# Patient Record
Sex: Female | Born: 1955
Health system: Southern US, Community
[De-identification: ages and names within clinical notes are randomized; demographics above are authoritative.]

## PROBLEM LIST (undated history)

## (undated) DIAGNOSIS — R112 Nausea with vomiting, unspecified: Secondary | ICD-10-CM

## (undated) DIAGNOSIS — K219 Gastro-esophageal reflux disease without esophagitis: Secondary | ICD-10-CM

## (undated) DIAGNOSIS — T7840XA Allergy, unspecified, initial encounter: Secondary | ICD-10-CM

## (undated) DIAGNOSIS — M797 Fibromyalgia: Secondary | ICD-10-CM

## (undated) DIAGNOSIS — M2550 Pain in unspecified joint: Secondary | ICD-10-CM

## (undated) DIAGNOSIS — F32A Depression, unspecified: Secondary | ICD-10-CM

## (undated) DIAGNOSIS — Z9989 Dependence on other enabling machines and devices: Secondary | ICD-10-CM

## (undated) DIAGNOSIS — J45909 Unspecified asthma, uncomplicated: Secondary | ICD-10-CM

## (undated) DIAGNOSIS — R609 Edema, unspecified: Secondary | ICD-10-CM

## (undated) DIAGNOSIS — R7303 Prediabetes: Secondary | ICD-10-CM

## (undated) DIAGNOSIS — Z5189 Encounter for other specified aftercare: Secondary | ICD-10-CM

## (undated) DIAGNOSIS — Z803 Family history of malignant neoplasm of breast: Secondary | ICD-10-CM

## (undated) DIAGNOSIS — M1990 Unspecified osteoarthritis, unspecified site: Secondary | ICD-10-CM

## (undated) DIAGNOSIS — K635 Polyp of colon: Secondary | ICD-10-CM

## (undated) DIAGNOSIS — D649 Anemia, unspecified: Secondary | ICD-10-CM

## (undated) DIAGNOSIS — E785 Hyperlipidemia, unspecified: Secondary | ICD-10-CM

## (undated) DIAGNOSIS — G4730 Sleep apnea, unspecified: Secondary | ICD-10-CM

## (undated) DIAGNOSIS — A879 Viral meningitis, unspecified: Secondary | ICD-10-CM

## (undated) DIAGNOSIS — E669 Obesity, unspecified: Secondary | ICD-10-CM

## (undated) DIAGNOSIS — G709 Myoneural disorder, unspecified: Secondary | ICD-10-CM

## (undated) DIAGNOSIS — C50919 Malignant neoplasm of unspecified site of unspecified female breast: Secondary | ICD-10-CM

## (undated) DIAGNOSIS — Z9889 Other specified postprocedural states: Secondary | ICD-10-CM

## (undated) DIAGNOSIS — F419 Anxiety disorder, unspecified: Secondary | ICD-10-CM

## (undated) DIAGNOSIS — I10 Essential (primary) hypertension: Secondary | ICD-10-CM

## (undated) DIAGNOSIS — F329 Major depressive disorder, single episode, unspecified: Secondary | ICD-10-CM

## (undated) DIAGNOSIS — J40 Bronchitis, not specified as acute or chronic: Secondary | ICD-10-CM

## (undated) DIAGNOSIS — R739 Hyperglycemia, unspecified: Secondary | ICD-10-CM

## (undated) HISTORY — DX: Dependence on other enabling machines and devices: Z99.89

## (undated) HISTORY — DX: Myoneural disorder, unspecified: G70.9

## (undated) HISTORY — DX: Depression, unspecified: F32.A

## (undated) HISTORY — PX: POLYPECTOMY: SHX149

## (undated) HISTORY — DX: Family history of malignant neoplasm of breast: Z80.3

## (undated) HISTORY — DX: Essential (primary) hypertension: I10

## (undated) HISTORY — PX: TONSILLECTOMY: SUR1361

## (undated) HISTORY — DX: Anxiety disorder, unspecified: F41.9

## (undated) HISTORY — DX: Pain in unspecified joint: M25.50

## (undated) HISTORY — PX: HAND SURGERY: SHX662

## (undated) HISTORY — DX: Fibromyalgia: M79.7

## (undated) HISTORY — DX: Gastro-esophageal reflux disease without esophagitis: K21.9

## (undated) HISTORY — DX: Viral meningitis, unspecified: A87.9

## (undated) HISTORY — DX: Anemia, unspecified: D64.9

## (undated) HISTORY — DX: Unspecified osteoarthritis, unspecified site: M19.90

## (undated) HISTORY — PX: KNEE ARTHROSCOPY: SUR90

## (undated) HISTORY — DX: Polyp of colon: K63.5

## (undated) HISTORY — DX: Malignant neoplasm of unspecified site of unspecified female breast: C50.919

## (undated) HISTORY — DX: Bronchitis, not specified as acute or chronic: J40

## (undated) HISTORY — DX: Allergy, unspecified, initial encounter: T78.40XA

## (undated) HISTORY — PX: BREAST SURGERY: SHX581

## (undated) HISTORY — DX: Unspecified asthma, uncomplicated: J45.909

## (undated) HISTORY — PX: BACK SURGERY: SHX140

## (undated) HISTORY — PX: OTHER SURGICAL HISTORY: SHX169

## (undated) HISTORY — PX: PARATHYROIDECTOMY: SHX19

## (undated) HISTORY — DX: Major depressive disorder, single episode, unspecified: F32.9

## (undated) HISTORY — DX: Encounter for other specified aftercare: Z51.89

## (undated) HISTORY — DX: Hyperlipidemia, unspecified: E78.5

## (undated) HISTORY — DX: Prediabetes: R73.03

## (undated) HISTORY — DX: Edema, unspecified: R60.9

## (undated) HISTORY — PX: ADENOIDECTOMY: SUR15

## (undated) HISTORY — PX: TOE SURGERY: SHX1073

## (undated) HISTORY — DX: Hyperglycemia, unspecified: R73.9

---

## 1978-05-29 HISTORY — PX: SEPTOPLASTY: SUR1290

## 1980-05-29 HISTORY — PX: TUMOR REMOVAL: SHX12

## 1991-05-30 HISTORY — PX: BREAST EXCISIONAL BIOPSY: SUR124

## 1994-01-13 HISTORY — PX: MASTECTOMY: SHX3

## 1996-05-29 HISTORY — PX: ABDOMINAL HYSTERECTOMY: SHX81

## 1997-08-24 ENCOUNTER — Ambulatory Visit (HOSPITAL_COMMUNITY): Admission: RE | Admit: 1997-08-24 | Discharge: 1997-08-24 | Payer: Self-pay | Admitting: Obstetrics and Gynecology

## 1998-08-19 ENCOUNTER — Inpatient Hospital Stay (HOSPITAL_COMMUNITY): Admission: RE | Admit: 1998-08-19 | Discharge: 1998-08-24 | Payer: Self-pay | Admitting: Obstetrics and Gynecology

## 1999-06-30 ENCOUNTER — Encounter: Admission: RE | Admit: 1999-06-30 | Discharge: 1999-06-30 | Payer: Self-pay | Admitting: Oncology

## 1999-06-30 ENCOUNTER — Encounter: Payer: Self-pay | Admitting: Oncology

## 1999-08-12 ENCOUNTER — Encounter (INDEPENDENT_AMBULATORY_CARE_PROVIDER_SITE_OTHER): Payer: Self-pay | Admitting: Specialist

## 1999-08-12 ENCOUNTER — Ambulatory Visit (HOSPITAL_BASED_OUTPATIENT_CLINIC_OR_DEPARTMENT_OTHER): Admission: RE | Admit: 1999-08-12 | Discharge: 1999-08-12 | Payer: Self-pay | Admitting: *Deleted

## 2001-02-20 ENCOUNTER — Encounter: Payer: Self-pay | Admitting: Emergency Medicine

## 2001-02-20 ENCOUNTER — Emergency Department (HOSPITAL_COMMUNITY): Admission: EM | Admit: 2001-02-20 | Discharge: 2001-02-20 | Payer: Self-pay | Admitting: Emergency Medicine

## 2001-02-23 ENCOUNTER — Ambulatory Visit (HOSPITAL_COMMUNITY): Admission: RE | Admit: 2001-02-23 | Discharge: 2001-02-23 | Payer: Self-pay | Admitting: Orthopedic Surgery

## 2001-02-24 ENCOUNTER — Encounter: Payer: Self-pay | Admitting: Orthopedic Surgery

## 2001-04-26 ENCOUNTER — Ambulatory Visit (HOSPITAL_COMMUNITY): Admission: RE | Admit: 2001-04-26 | Discharge: 2001-04-26 | Payer: Self-pay | Admitting: Internal Medicine

## 2001-04-26 ENCOUNTER — Encounter: Payer: Self-pay | Admitting: Internal Medicine

## 2001-04-30 ENCOUNTER — Other Ambulatory Visit: Admission: RE | Admit: 2001-04-30 | Discharge: 2001-04-30 | Payer: Self-pay | Admitting: Obstetrics and Gynecology

## 2001-05-09 ENCOUNTER — Encounter: Payer: Self-pay | Admitting: Obstetrics and Gynecology

## 2001-05-09 ENCOUNTER — Encounter: Admission: RE | Admit: 2001-05-09 | Discharge: 2001-05-09 | Payer: Self-pay | Admitting: Obstetrics and Gynecology

## 2001-06-14 ENCOUNTER — Encounter: Payer: Self-pay | Admitting: *Deleted

## 2001-06-18 ENCOUNTER — Encounter (INDEPENDENT_AMBULATORY_CARE_PROVIDER_SITE_OTHER): Payer: Self-pay | Admitting: *Deleted

## 2001-06-18 ENCOUNTER — Ambulatory Visit (HOSPITAL_COMMUNITY): Admission: RE | Admit: 2001-06-18 | Discharge: 2001-06-19 | Payer: Self-pay | Admitting: *Deleted

## 2001-06-27 ENCOUNTER — Emergency Department (HOSPITAL_COMMUNITY): Admission: EM | Admit: 2001-06-27 | Discharge: 2001-06-27 | Payer: Self-pay

## 2001-09-30 ENCOUNTER — Encounter: Admission: RE | Admit: 2001-09-30 | Discharge: 2001-09-30 | Payer: Self-pay | Admitting: Family Medicine

## 2001-09-30 ENCOUNTER — Encounter: Payer: Self-pay | Admitting: Family Medicine

## 2001-10-03 ENCOUNTER — Encounter: Admission: RE | Admit: 2001-10-03 | Discharge: 2001-10-03 | Payer: Self-pay | Admitting: Family Medicine

## 2001-10-03 ENCOUNTER — Encounter: Payer: Self-pay | Admitting: Family Medicine

## 2002-06-20 ENCOUNTER — Encounter: Payer: Self-pay | Admitting: Internal Medicine

## 2002-06-20 ENCOUNTER — Inpatient Hospital Stay (HOSPITAL_COMMUNITY): Admission: EM | Admit: 2002-06-20 | Discharge: 2002-06-25 | Payer: Self-pay | Admitting: Internal Medicine

## 2002-07-24 ENCOUNTER — Other Ambulatory Visit: Admission: RE | Admit: 2002-07-24 | Discharge: 2002-07-24 | Payer: Self-pay | Admitting: Obstetrics and Gynecology

## 2002-09-28 ENCOUNTER — Encounter: Payer: Self-pay | Admitting: Emergency Medicine

## 2002-09-28 ENCOUNTER — Emergency Department (HOSPITAL_COMMUNITY): Admission: EM | Admit: 2002-09-28 | Discharge: 2002-09-28 | Payer: Self-pay | Admitting: Emergency Medicine

## 2003-07-30 ENCOUNTER — Other Ambulatory Visit: Admission: RE | Admit: 2003-07-30 | Discharge: 2003-07-30 | Payer: Self-pay | Admitting: Obstetrics and Gynecology

## 2004-03-19 ENCOUNTER — Ambulatory Visit: Payer: Self-pay | Admitting: Oncology

## 2004-06-27 ENCOUNTER — Encounter: Admission: RE | Admit: 2004-06-27 | Discharge: 2004-06-27 | Payer: Self-pay | Admitting: Otolaryngology

## 2004-08-09 ENCOUNTER — Other Ambulatory Visit: Admission: RE | Admit: 2004-08-09 | Discharge: 2004-08-09 | Payer: Self-pay | Admitting: Obstetrics and Gynecology

## 2005-12-25 ENCOUNTER — Ambulatory Visit: Payer: Self-pay | Admitting: Family Medicine

## 2006-01-22 ENCOUNTER — Ambulatory Visit: Payer: Self-pay | Admitting: Family Medicine

## 2006-02-07 ENCOUNTER — Ambulatory Visit: Payer: Self-pay | Admitting: Family Medicine

## 2006-02-08 ENCOUNTER — Ambulatory Visit: Payer: Self-pay | Admitting: Family Medicine

## 2006-02-20 ENCOUNTER — Ambulatory Visit (HOSPITAL_BASED_OUTPATIENT_CLINIC_OR_DEPARTMENT_OTHER): Admission: RE | Admit: 2006-02-20 | Discharge: 2006-02-21 | Payer: Self-pay | Admitting: Orthopaedic Surgery

## 2006-03-20 ENCOUNTER — Ambulatory Visit: Payer: Self-pay | Admitting: Internal Medicine

## 2006-04-03 ENCOUNTER — Encounter (INDEPENDENT_AMBULATORY_CARE_PROVIDER_SITE_OTHER): Payer: Self-pay | Admitting: *Deleted

## 2006-04-03 ENCOUNTER — Ambulatory Visit: Payer: Self-pay | Admitting: Internal Medicine

## 2006-05-07 ENCOUNTER — Ambulatory Visit: Payer: Self-pay | Admitting: Family Medicine

## 2006-06-25 DIAGNOSIS — I10 Essential (primary) hypertension: Secondary | ICD-10-CM | POA: Insufficient documentation

## 2006-06-25 DIAGNOSIS — F418 Other specified anxiety disorders: Secondary | ICD-10-CM

## 2006-06-25 DIAGNOSIS — E1159 Type 2 diabetes mellitus with other circulatory complications: Secondary | ICD-10-CM | POA: Insufficient documentation

## 2006-06-25 DIAGNOSIS — K219 Gastro-esophageal reflux disease without esophagitis: Secondary | ICD-10-CM

## 2006-06-25 HISTORY — DX: Other specified anxiety disorders: F41.8

## 2006-06-25 HISTORY — DX: Gastro-esophageal reflux disease without esophagitis: K21.9

## 2006-06-25 HISTORY — DX: Essential (primary) hypertension: I10

## 2006-07-12 ENCOUNTER — Other Ambulatory Visit: Admission: RE | Admit: 2006-07-12 | Discharge: 2006-07-12 | Payer: Self-pay | Admitting: Obstetrics and Gynecology

## 2006-08-03 ENCOUNTER — Ambulatory Visit: Payer: Self-pay | Admitting: Vascular Surgery

## 2006-08-03 ENCOUNTER — Emergency Department (HOSPITAL_COMMUNITY): Admission: EM | Admit: 2006-08-03 | Discharge: 2006-08-03 | Payer: Self-pay | Admitting: Emergency Medicine

## 2006-08-07 ENCOUNTER — Ambulatory Visit (HOSPITAL_BASED_OUTPATIENT_CLINIC_OR_DEPARTMENT_OTHER): Admission: RE | Admit: 2006-08-07 | Discharge: 2006-08-07 | Payer: Self-pay | Admitting: Orthopaedic Surgery

## 2006-10-01 ENCOUNTER — Ambulatory Visit: Payer: Self-pay | Admitting: Internal Medicine

## 2006-10-18 ENCOUNTER — Ambulatory Visit: Payer: Self-pay | Admitting: Internal Medicine

## 2006-11-01 ENCOUNTER — Telehealth (INDEPENDENT_AMBULATORY_CARE_PROVIDER_SITE_OTHER): Payer: Self-pay | Admitting: *Deleted

## 2006-11-01 ENCOUNTER — Ambulatory Visit: Payer: Self-pay | Admitting: Family Medicine

## 2006-11-01 DIAGNOSIS — R609 Edema, unspecified: Secondary | ICD-10-CM | POA: Insufficient documentation

## 2007-02-28 ENCOUNTER — Ambulatory Visit: Payer: Self-pay | Admitting: Family Medicine

## 2007-02-28 DIAGNOSIS — M1990 Unspecified osteoarthritis, unspecified site: Secondary | ICD-10-CM | POA: Insufficient documentation

## 2007-02-28 DIAGNOSIS — M79609 Pain in unspecified limb: Secondary | ICD-10-CM | POA: Insufficient documentation

## 2007-02-28 LAB — CONVERTED CEMR LAB
BUN: 10 mg/dL (ref 6–23)
CO2: 32 meq/L (ref 19–32)
Calcium: 9.8 mg/dL (ref 8.4–10.5)
Chloride: 102 meq/L (ref 96–112)
Creatinine, Ser: 0.8 mg/dL (ref 0.4–1.2)
GFR calc Af Amer: 97 mL/min
GFR calc non Af Amer: 80 mL/min
Glucose, Bld: 112 mg/dL — ABNORMAL HIGH (ref 70–99)
Potassium: 4.3 meq/L (ref 3.5–5.1)
Sodium: 141 meq/L (ref 135–145)
Uric Acid, Serum: 6.6 mg/dL (ref 2.4–7.0)

## 2007-03-01 ENCOUNTER — Telehealth (INDEPENDENT_AMBULATORY_CARE_PROVIDER_SITE_OTHER): Payer: Self-pay | Admitting: *Deleted

## 2007-03-20 ENCOUNTER — Encounter (INDEPENDENT_AMBULATORY_CARE_PROVIDER_SITE_OTHER): Payer: Self-pay | Admitting: Family Medicine

## 2007-04-10 ENCOUNTER — Encounter (INDEPENDENT_AMBULATORY_CARE_PROVIDER_SITE_OTHER): Payer: Self-pay | Admitting: Family Medicine

## 2007-05-21 ENCOUNTER — Encounter (INDEPENDENT_AMBULATORY_CARE_PROVIDER_SITE_OTHER): Payer: Self-pay | Admitting: Family Medicine

## 2007-05-22 ENCOUNTER — Encounter (INDEPENDENT_AMBULATORY_CARE_PROVIDER_SITE_OTHER): Payer: Self-pay | Admitting: Family Medicine

## 2007-06-13 ENCOUNTER — Ambulatory Visit: Payer: Self-pay | Admitting: Internal Medicine

## 2007-06-13 DIAGNOSIS — J069 Acute upper respiratory infection, unspecified: Secondary | ICD-10-CM | POA: Insufficient documentation

## 2007-07-04 ENCOUNTER — Telehealth (INDEPENDENT_AMBULATORY_CARE_PROVIDER_SITE_OTHER): Payer: Self-pay | Admitting: *Deleted

## 2007-07-05 ENCOUNTER — Ambulatory Visit: Payer: Self-pay | Admitting: Family Medicine

## 2007-07-05 DIAGNOSIS — J309 Allergic rhinitis, unspecified: Secondary | ICD-10-CM | POA: Insufficient documentation

## 2007-07-05 HISTORY — DX: Allergic rhinitis, unspecified: J30.9

## 2007-07-08 ENCOUNTER — Telehealth (INDEPENDENT_AMBULATORY_CARE_PROVIDER_SITE_OTHER): Payer: Self-pay | Admitting: *Deleted

## 2007-07-17 ENCOUNTER — Other Ambulatory Visit: Admission: RE | Admit: 2007-07-17 | Discharge: 2007-07-17 | Payer: Self-pay | Admitting: Obstetrics and Gynecology

## 2007-07-31 ENCOUNTER — Telehealth (INDEPENDENT_AMBULATORY_CARE_PROVIDER_SITE_OTHER): Payer: Self-pay | Admitting: *Deleted

## 2007-08-05 ENCOUNTER — Telehealth (INDEPENDENT_AMBULATORY_CARE_PROVIDER_SITE_OTHER): Payer: Self-pay | Admitting: *Deleted

## 2007-08-06 ENCOUNTER — Ambulatory Visit: Payer: Self-pay | Admitting: Internal Medicine

## 2007-08-06 DIAGNOSIS — R0609 Other forms of dyspnea: Secondary | ICD-10-CM | POA: Insufficient documentation

## 2007-08-06 DIAGNOSIS — Z862 Personal history of diseases of the blood and blood-forming organs and certain disorders involving the immune mechanism: Secondary | ICD-10-CM | POA: Insufficient documentation

## 2007-08-06 DIAGNOSIS — R0989 Other specified symptoms and signs involving the circulatory and respiratory systems: Secondary | ICD-10-CM | POA: Insufficient documentation

## 2007-08-06 DIAGNOSIS — Z8639 Personal history of other endocrine, nutritional and metabolic disease: Secondary | ICD-10-CM | POA: Insufficient documentation

## 2007-08-06 DIAGNOSIS — F411 Generalized anxiety disorder: Secondary | ICD-10-CM | POA: Insufficient documentation

## 2007-08-09 ENCOUNTER — Encounter (INDEPENDENT_AMBULATORY_CARE_PROVIDER_SITE_OTHER): Payer: Self-pay | Admitting: *Deleted

## 2007-10-22 ENCOUNTER — Ambulatory Visit (HOSPITAL_BASED_OUTPATIENT_CLINIC_OR_DEPARTMENT_OTHER): Admission: RE | Admit: 2007-10-22 | Discharge: 2007-10-22 | Payer: Self-pay | Admitting: Orthopedic Surgery

## 2007-10-23 ENCOUNTER — Ambulatory Visit: Payer: Self-pay | Admitting: Internal Medicine

## 2007-10-23 LAB — CONVERTED CEMR LAB
BUN: 20 mg/dL (ref 6–23)
CO2: 27 meq/L (ref 19–32)
Calcium: 9.1 mg/dL (ref 8.4–10.5)
Chloride: 100 meq/L (ref 96–112)
Creatinine, Ser: 0.9 mg/dL (ref 0.4–1.2)
GFR calc Af Amer: 84 mL/min
GFR calc non Af Amer: 70 mL/min
Glucose, Bld: 109 mg/dL — ABNORMAL HIGH (ref 70–99)
Potassium: 4 meq/L (ref 3.5–5.1)
Sodium: 138 meq/L (ref 135–145)

## 2007-10-24 ENCOUNTER — Encounter: Payer: Self-pay | Admitting: Internal Medicine

## 2007-10-27 LAB — CONVERTED CEMR LAB: Vit D, 1,25-Dihydroxy: 37 (ref 30–89)

## 2007-10-28 ENCOUNTER — Encounter (INDEPENDENT_AMBULATORY_CARE_PROVIDER_SITE_OTHER): Payer: Self-pay | Admitting: *Deleted

## 2007-12-05 ENCOUNTER — Ambulatory Visit: Payer: Self-pay | Admitting: Family Medicine

## 2007-12-12 ENCOUNTER — Telehealth (INDEPENDENT_AMBULATORY_CARE_PROVIDER_SITE_OTHER): Payer: Self-pay | Admitting: *Deleted

## 2008-01-09 ENCOUNTER — Ambulatory Visit: Payer: Self-pay | Admitting: Family Medicine

## 2008-01-11 LAB — CONVERTED CEMR LAB
BUN: 10 mg/dL (ref 6–23)
CO2: 31 meq/L (ref 19–32)
Calcium: 9.3 mg/dL (ref 8.4–10.5)
Chloride: 102 meq/L (ref 96–112)
Creatinine, Ser: 0.8 mg/dL (ref 0.4–1.2)
GFR calc Af Amer: 96 mL/min
GFR calc non Af Amer: 80 mL/min
Glucose, Bld: 91 mg/dL (ref 70–99)
Potassium: 3.9 meq/L (ref 3.5–5.1)
Sodium: 138 meq/L (ref 135–145)

## 2008-01-13 ENCOUNTER — Encounter (INDEPENDENT_AMBULATORY_CARE_PROVIDER_SITE_OTHER): Payer: Self-pay | Admitting: *Deleted

## 2008-03-05 ENCOUNTER — Ambulatory Visit: Payer: Self-pay | Admitting: Family Medicine

## 2008-03-05 DIAGNOSIS — M797 Fibromyalgia: Secondary | ICD-10-CM | POA: Insufficient documentation

## 2008-03-05 LAB — CONVERTED CEMR LAB
Calcium: 9.5 mg/dL (ref 8.4–10.5)
Uric Acid, Serum: 5.3 mg/dL (ref 2.4–7.0)

## 2008-03-06 LAB — CONVERTED CEMR LAB: Vit D, 1,25-Dihydroxy: 28 — ABNORMAL LOW (ref 30–89)

## 2008-03-11 ENCOUNTER — Ambulatory Visit: Payer: Self-pay | Admitting: Family Medicine

## 2008-03-11 DIAGNOSIS — E559 Vitamin D deficiency, unspecified: Secondary | ICD-10-CM

## 2008-03-11 HISTORY — DX: Vitamin D deficiency, unspecified: E55.9

## 2008-03-23 ENCOUNTER — Ambulatory Visit: Payer: Self-pay | Admitting: Family Medicine

## 2008-03-27 ENCOUNTER — Telehealth (INDEPENDENT_AMBULATORY_CARE_PROVIDER_SITE_OTHER): Payer: Self-pay | Admitting: *Deleted

## 2008-04-02 ENCOUNTER — Ambulatory Visit: Payer: Self-pay | Admitting: Family Medicine

## 2008-05-29 HISTORY — PX: OTHER SURGICAL HISTORY: SHX169

## 2008-06-18 ENCOUNTER — Inpatient Hospital Stay (HOSPITAL_COMMUNITY): Admission: RE | Admit: 2008-06-18 | Discharge: 2008-06-21 | Payer: Self-pay | Admitting: Orthopaedic Surgery

## 2008-06-18 HISTORY — PX: TOTAL KNEE ARTHROPLASTY: SHX125

## 2008-07-15 LAB — CONVERTED CEMR LAB: Pap Smear: NORMAL

## 2008-07-17 ENCOUNTER — Encounter (INDEPENDENT_AMBULATORY_CARE_PROVIDER_SITE_OTHER): Payer: Self-pay | Admitting: Orthopaedic Surgery

## 2008-07-17 ENCOUNTER — Other Ambulatory Visit: Admission: RE | Admit: 2008-07-17 | Discharge: 2008-07-17 | Payer: Self-pay | Admitting: Obstetrics and Gynecology

## 2008-07-31 ENCOUNTER — Encounter: Admission: RE | Admit: 2008-07-31 | Discharge: 2008-07-31 | Payer: Self-pay | Admitting: Obstetrics and Gynecology

## 2008-08-19 ENCOUNTER — Telehealth (INDEPENDENT_AMBULATORY_CARE_PROVIDER_SITE_OTHER): Payer: Self-pay | Admitting: *Deleted

## 2008-09-02 ENCOUNTER — Ambulatory Visit: Payer: Self-pay | Admitting: Family Medicine

## 2008-09-02 LAB — CONVERTED CEMR LAB
Bilirubin Urine: NEGATIVE
Blood in Urine, dipstick: NEGATIVE
Glucose, Urine, Semiquant: NEGATIVE
Ketones, urine, test strip: NEGATIVE
Nitrite: NEGATIVE
Protein, U semiquant: NEGATIVE
Specific Gravity, Urine: 1.01
Urobilinogen, UA: 0.2
WBC Urine, dipstick: NEGATIVE
pH: 7

## 2008-09-04 ENCOUNTER — Telehealth: Payer: Self-pay | Admitting: Family Medicine

## 2008-11-10 ENCOUNTER — Telehealth (INDEPENDENT_AMBULATORY_CARE_PROVIDER_SITE_OTHER): Payer: Self-pay | Admitting: *Deleted

## 2008-12-28 ENCOUNTER — Telehealth (INDEPENDENT_AMBULATORY_CARE_PROVIDER_SITE_OTHER): Payer: Self-pay | Admitting: *Deleted

## 2009-02-15 ENCOUNTER — Ambulatory Visit: Payer: Self-pay | Admitting: Family Medicine

## 2009-02-15 DIAGNOSIS — G4700 Insomnia, unspecified: Secondary | ICD-10-CM

## 2009-02-15 HISTORY — DX: Insomnia, unspecified: G47.00

## 2009-03-01 ENCOUNTER — Ambulatory Visit: Payer: Self-pay | Admitting: Family Medicine

## 2009-03-01 DIAGNOSIS — T887XXA Unspecified adverse effect of drug or medicament, initial encounter: Secondary | ICD-10-CM | POA: Insufficient documentation

## 2009-03-02 ENCOUNTER — Telehealth (INDEPENDENT_AMBULATORY_CARE_PROVIDER_SITE_OTHER): Payer: Self-pay | Admitting: *Deleted

## 2009-03-22 ENCOUNTER — Telehealth (INDEPENDENT_AMBULATORY_CARE_PROVIDER_SITE_OTHER): Payer: Self-pay | Admitting: *Deleted

## 2009-03-30 ENCOUNTER — Ambulatory Visit: Admission: RE | Admit: 2009-03-30 | Discharge: 2009-03-30 | Payer: Self-pay | Admitting: Psychiatry

## 2009-04-29 ENCOUNTER — Encounter: Payer: Self-pay | Admitting: Family Medicine

## 2009-04-29 ENCOUNTER — Ambulatory Visit: Payer: Self-pay | Admitting: Family Medicine

## 2009-04-29 DIAGNOSIS — Z8669 Personal history of other diseases of the nervous system and sense organs: Secondary | ICD-10-CM | POA: Insufficient documentation

## 2009-04-29 DIAGNOSIS — Z8661 Personal history of infections of the central nervous system: Secondary | ICD-10-CM | POA: Insufficient documentation

## 2009-04-29 LAB — CONVERTED CEMR LAB
Bilirubin Urine: NEGATIVE
Blood in Urine, dipstick: NEGATIVE
Glucose, Urine, Semiquant: NEGATIVE
Ketones, urine, test strip: NEGATIVE
Nitrite: NEGATIVE
Protein, U semiquant: NEGATIVE
Specific Gravity, Urine: <1.005
Urobilinogen, UA: NEGATIVE
WBC Urine, dipstick: NEGATIVE
pH: 6.5

## 2009-05-03 LAB — CONVERTED CEMR LAB
ALT: 19 units/L (ref 0–35)
AST: 20 units/L (ref 0–37)
Albumin: 4 g/dL (ref 3.5–5.2)
Alkaline Phosphatase: 84 units/L (ref 39–117)
BUN: 15 mg/dL (ref 6–23)
Basophils Absolute: 0.1 10*3/uL (ref 0.0–0.1)
Basophils Relative: 1.2 % (ref 0.0–3.0)
Bilirubin, Direct: 0.1 mg/dL (ref 0.0–0.3)
CO2: 31 meq/L (ref 19–32)
Calcium: 9.5 mg/dL (ref 8.4–10.5)
Chloride: 103 meq/L (ref 96–112)
Cholesterol: 162 mg/dL (ref 0–200)
Creatinine, Ser: 0.9 mg/dL (ref 0.4–1.2)
Eosinophils Absolute: 0.2 10*3/uL (ref 0.0–0.7)
Eosinophils Relative: 3 % (ref 0.0–5.0)
GFR calc non Af Amer: 69.15 mL/min (ref >60)
Glucose, Bld: 88 mg/dL (ref 70–99)
HCT: 37.1 % (ref 36.0–46.0)
HDL: 52.2 mg/dL (ref >39.00)
Hemoglobin: 12.3 g/dL (ref 12.0–15.0)
LDL Cholesterol: 89 mg/dL (ref 0–99)
Lymphocytes Relative: 40.6 % (ref 12.0–46.0)
Lymphs Abs: 3 10*3/uL (ref 0.7–4.0)
MCHC: 33.1 g/dL (ref 30.0–36.0)
MCV: 89.3 fL (ref 78.0–100.0)
Monocytes Absolute: 0.5 10*3/uL (ref 0.1–1.0)
Monocytes Relative: 6.8 % (ref 3.0–12.0)
Neutro Abs: 3.6 10*3/uL (ref 1.4–7.7)
Neutrophils Relative %: 48.4 % (ref 43.0–77.0)
Platelets: 235 10*3/uL (ref 150.0–400.0)
Potassium: 3.9 meq/L (ref 3.5–5.1)
RBC: 4.15 M/uL (ref 3.87–5.11)
RDW: 14.7 % — ABNORMAL HIGH (ref 11.5–14.6)
Sodium: 141 meq/L (ref 135–145)
TSH: 0.69 microintl units/mL (ref 0.35–5.50)
Total Bilirubin: 0.8 mg/dL (ref 0.3–1.2)
Total CHOL/HDL Ratio: 3
Total Protein: 7.1 g/dL (ref 6.0–8.3)
Triglycerides: 102 mg/dL (ref 0.0–149.0)
VLDL: 20.4 mg/dL (ref 0.0–40.0)
Vit D, 25-Hydroxy: 31 ng/mL (ref 30–89)
WBC: 7.4 10*3/uL (ref 4.5–10.5)

## 2009-05-17 ENCOUNTER — Encounter: Payer: Self-pay | Admitting: Family Medicine

## 2009-06-28 ENCOUNTER — Telehealth (INDEPENDENT_AMBULATORY_CARE_PROVIDER_SITE_OTHER): Payer: Self-pay | Admitting: *Deleted

## 2009-07-19 ENCOUNTER — Other Ambulatory Visit: Admission: RE | Admit: 2009-07-19 | Discharge: 2009-07-19 | Payer: Self-pay | Admitting: Obstetrics and Gynecology

## 2009-08-02 ENCOUNTER — Encounter: Admission: RE | Admit: 2009-08-02 | Discharge: 2009-08-02 | Payer: Self-pay | Admitting: Obstetrics and Gynecology

## 2009-09-22 ENCOUNTER — Ambulatory Visit: Payer: Self-pay | Admitting: Family Medicine

## 2009-09-22 DIAGNOSIS — F900 Attention-deficit hyperactivity disorder, predominantly inattentive type: Secondary | ICD-10-CM | POA: Insufficient documentation

## 2009-09-22 DIAGNOSIS — J019 Acute sinusitis, unspecified: Secondary | ICD-10-CM | POA: Insufficient documentation

## 2009-09-22 DIAGNOSIS — F988 Other specified behavioral and emotional disorders with onset usually occurring in childhood and adolescence: Secondary | ICD-10-CM | POA: Insufficient documentation

## 2009-09-22 HISTORY — DX: Other specified behavioral and emotional disorders with onset usually occurring in childhood and adolescence: F98.8

## 2009-11-01 ENCOUNTER — Ambulatory Visit: Payer: Self-pay | Admitting: Family Medicine

## 2009-11-01 DIAGNOSIS — R079 Chest pain, unspecified: Secondary | ICD-10-CM | POA: Insufficient documentation

## 2009-11-01 HISTORY — DX: Chest pain, unspecified: R07.9

## 2009-11-22 ENCOUNTER — Ambulatory Visit: Payer: Self-pay | Admitting: Family Medicine

## 2009-11-25 LAB — CONVERTED CEMR LAB
BUN: 15 mg/dL (ref 6–23)
CO2: 30 meq/L (ref 19–32)
Calcium: 9.2 mg/dL (ref 8.4–10.5)
Chloride: 105 meq/L (ref 96–112)
Creatinine, Ser: 0.7 mg/dL (ref 0.4–1.2)
GFR calc non Af Amer: 89.27 mL/min (ref >60)
Glucose, Bld: 98 mg/dL (ref 70–99)
Potassium: 4.1 meq/L (ref 3.5–5.1)
Sodium: 141 meq/L (ref 135–145)

## 2010-02-02 ENCOUNTER — Encounter: Payer: Self-pay | Admitting: Family Medicine

## 2010-03-03 ENCOUNTER — Telehealth: Payer: Self-pay | Admitting: Family Medicine

## 2010-03-10 ENCOUNTER — Telehealth (INDEPENDENT_AMBULATORY_CARE_PROVIDER_SITE_OTHER): Payer: Self-pay | Admitting: *Deleted

## 2010-03-21 ENCOUNTER — Telehealth (INDEPENDENT_AMBULATORY_CARE_PROVIDER_SITE_OTHER): Payer: Self-pay | Admitting: *Deleted

## 2010-03-25 ENCOUNTER — Telehealth (INDEPENDENT_AMBULATORY_CARE_PROVIDER_SITE_OTHER): Payer: Self-pay | Admitting: *Deleted

## 2010-03-28 ENCOUNTER — Ambulatory Visit: Payer: Self-pay | Admitting: Family Medicine

## 2010-04-11 ENCOUNTER — Telehealth (INDEPENDENT_AMBULATORY_CARE_PROVIDER_SITE_OTHER): Payer: Self-pay | Admitting: *Deleted

## 2010-05-18 ENCOUNTER — Ambulatory Visit: Payer: Self-pay | Admitting: Family Medicine

## 2010-06-11 ENCOUNTER — Observation Stay (HOSPITAL_COMMUNITY)
Admission: EM | Admit: 2010-06-11 | Discharge: 2010-06-13 | Payer: Self-pay | Source: Home / Self Care | Attending: Internal Medicine | Admitting: Internal Medicine

## 2010-06-13 ENCOUNTER — Encounter: Payer: Self-pay | Admitting: Family Medicine

## 2010-06-13 LAB — DIFFERENTIAL
Basophils Absolute: 0 10*3/uL (ref 0.0–0.1)
Basophils Relative: 0 % (ref 0–1)
Eosinophils Absolute: 0.1 10*3/uL (ref 0.0–0.7)
Eosinophils Relative: 1 % (ref 0–5)
Lymphocytes Relative: 31 % (ref 12–46)
Lymphs Abs: 2.8 10*3/uL (ref 0.7–4.0)
Monocytes Absolute: 0.7 10*3/uL (ref 0.1–1.0)
Monocytes Relative: 8 % (ref 3–12)
Neutro Abs: 5.5 10*3/uL (ref 1.7–7.7)
Neutrophils Relative %: 61 % (ref 43–77)

## 2010-06-13 LAB — CBC
HCT: 35.7 % — ABNORMAL LOW (ref 36.0–46.0)
HCT: 33.7 % — ABNORMAL LOW (ref 36.0–46.0)
Hemoglobin: 11.7 g/dL — ABNORMAL LOW (ref 12.0–15.0)
Hemoglobin: 10.8 g/dL — ABNORMAL LOW (ref 12.0–15.0)
MCH: 28.2 pg (ref 26.0–34.0)
MCH: 27.8 pg (ref 26.0–34.0)
MCHC: 32.8 g/dL (ref 30.0–36.0)
MCHC: 32 g/dL (ref 30.0–36.0)
MCV: 86 fL (ref 78.0–100.0)
MCV: 86.6 fL (ref 78.0–100.0)
Platelets: 204 10*3/uL (ref 150–400)
Platelets: 227 10*3/uL (ref 150–400)
RBC: 4.15 MIL/uL (ref 3.87–5.11)
RBC: 3.89 MIL/uL (ref 3.87–5.11)
RDW: 14.2 % (ref 11.5–15.5)
RDW: 14.5 % (ref 11.5–15.5)
WBC: 9 10*3/uL (ref 4.0–10.5)
WBC: 9.9 10*3/uL (ref 4.0–10.5)

## 2010-06-13 LAB — POCT I-STAT, CHEM 8
BUN: 15 mg/dL (ref 6–23)
Calcium, Ion: 0.99 mmol/L — ABNORMAL LOW (ref 1.12–1.32)
Chloride: 102 mEq/L (ref 96–112)
Creatinine, Ser: 0.9 mg/dL (ref 0.4–1.2)
Glucose, Bld: 141 mg/dL — ABNORMAL HIGH (ref 70–99)
HCT: 35 % — ABNORMAL LOW (ref 36.0–46.0)
Hemoglobin: 11.9 g/dL — ABNORMAL LOW (ref 12.0–15.0)
Potassium: 3 mEq/L — ABNORMAL LOW (ref 3.5–5.1)
Sodium: 137 mEq/L (ref 135–145)
TCO2: 24 mmol/L (ref 0–100)

## 2010-06-13 LAB — HEPATIC FUNCTION PANEL
ALT: 18 U/L (ref 0–35)
AST: 24 U/L (ref 0–37)
Albumin: 3.3 g/dL — ABNORMAL LOW (ref 3.5–5.2)
Alkaline Phosphatase: 60 U/L (ref 39–117)
Bilirubin, Direct: 0.1 mg/dL (ref 0.0–0.3)
Indirect Bilirubin: 0.3 mg/dL (ref 0.3–0.9)
Total Bilirubin: 0.4 mg/dL (ref 0.3–1.2)
Total Protein: 5.8 g/dL — ABNORMAL LOW (ref 6.0–8.3)

## 2010-06-13 LAB — POCT CARDIAC MARKERS
CKMB, poc: <1 ng/mL — ABNORMAL LOW (ref 1.0–8.0)
Myoglobin, poc: 83.6 ng/mL (ref 12–200)
Troponin i, poc: <0.05 ng/mL (ref 0.00–0.09)

## 2010-06-13 LAB — CARDIAC PANEL(CRET KIN+CKTOT+MB+TROPI)
CK, MB: 1.4 ng/mL (ref 0.3–4.0)
CK, MB: 2 ng/mL (ref 0.3–4.0)
CK, MB: 2.2 ng/mL (ref 0.3–4.0)
Relative Index: 1 (ref 0.0–2.5)
Relative Index: 0.7 (ref 0.0–2.5)
Relative Index: 0.5 (ref 0.0–2.5)
Total CK: 145 U/L (ref 7–177)
Total CK: 293 U/L — ABNORMAL HIGH (ref 7–177)
Total CK: 430 U/L — ABNORMAL HIGH (ref 7–177)
Troponin I: 0.01 ng/mL (ref 0.00–0.06)
Troponin I: <0.01 ng/mL (ref 0.00–0.06)
Troponin I: <0.01 ng/mL (ref 0.00–0.06)

## 2010-06-13 LAB — BASIC METABOLIC PANEL
BUN: 13 mg/dL (ref 6–23)
CO2: 25 mEq/L (ref 19–32)
Calcium: 9 mg/dL (ref 8.4–10.5)
Chloride: 104 mEq/L (ref 96–112)
Creatinine, Ser: 0.77 mg/dL (ref 0.4–1.2)
GFR calc Af Amer: >60 mL/min (ref >60)
GFR calc non Af Amer: >60 mL/min (ref >60)
Glucose, Bld: 145 mg/dL — ABNORMAL HIGH (ref 70–99)
Potassium: 4 mEq/L (ref 3.5–5.1)
Sodium: 136 mEq/L (ref 135–145)

## 2010-06-13 LAB — D-DIMER, QUANTITATIVE: D-Dimer, Quant: 0.67 ug/mL-FEU — ABNORMAL HIGH (ref 0.00–0.48)

## 2010-06-13 LAB — HEMOGLOBIN A1C
Hgb A1c MFr Bld: 6.2 % — ABNORMAL HIGH (ref <5.7)
Mean Plasma Glucose: 131 mg/dL — ABNORMAL HIGH (ref <117)

## 2010-06-13 LAB — BRAIN NATRIURETIC PEPTIDE: Pro B Natriuretic peptide (BNP): <30 pg/mL (ref 0.0–100.0)

## 2010-06-19 ENCOUNTER — Encounter: Payer: Self-pay | Admitting: Obstetrics and Gynecology

## 2010-06-20 ENCOUNTER — Ambulatory Visit
Admission: RE | Admit: 2010-06-20 | Discharge: 2010-06-20 | Payer: Self-pay | Source: Home / Self Care | Attending: Family Medicine | Admitting: Family Medicine

## 2010-06-20 DIAGNOSIS — J988 Other specified respiratory disorders: Secondary | ICD-10-CM | POA: Insufficient documentation

## 2010-06-21 LAB — CULTURE, BLOOD (ROUTINE X 2)
Culture  Setup Time: 201201150144
Culture  Setup Time: 201201150144
Culture: NO GROWTH
Culture: NO GROWTH

## 2010-06-23 ENCOUNTER — Encounter: Payer: Self-pay | Admitting: Critical Care Medicine

## 2010-06-23 ENCOUNTER — Ambulatory Visit: Admit: 2010-06-23 | Payer: Self-pay | Admitting: Critical Care Medicine

## 2010-06-23 ENCOUNTER — Ambulatory Visit
Admission: RE | Admit: 2010-06-23 | Discharge: 2010-06-23 | Payer: Self-pay | Source: Home / Self Care | Attending: Critical Care Medicine | Admitting: Critical Care Medicine

## 2010-06-26 LAB — CONVERTED CEMR LAB
ALT: 25 units/L (ref 0–40)
AST: 24 units/L (ref 0–37)
BUN: 14 mg/dL (ref 6–23)
BUN: 15 mg/dL (ref 6–23)
BUN: 9 mg/dL (ref 6–23)
Basophils Absolute: 0.3 10*3/uL — ABNORMAL HIGH (ref 0.0–0.1)
Basophils Relative: 4.4 % — ABNORMAL HIGH (ref 0.0–1.0)
CO2: 32 meq/L (ref 19–32)
Calcium: 9.9 mg/dL (ref 8.4–10.5)
Calcium: 9.9 mg/dL (ref 8.4–10.5)
Chloride: 102 meq/L (ref 96–112)
Creatinine, Ser: 0.8 mg/dL (ref 0.4–1.2)
Creatinine, Ser: 0.8 mg/dL (ref 0.4–1.2)
Creatinine, Ser: 0.9 mg/dL (ref 0.4–1.2)
Eosinophils Absolute: 0.2 10*3/uL (ref 0.0–0.6)
Eosinophils Relative: 1.9 % (ref 0.0–5.0)
Free T4: 0.7 ng/dL (ref 0.6–1.6)
GFR calc non Af Amer: 69.92 mL/min (ref >60)
Glucose, Bld: 124 mg/dL — ABNORMAL HIGH (ref 70–99)
HCT: 37.2 % (ref 36.0–46.0)
Hemoglobin: 12.3 g/dL (ref 12.0–15.0)
Lymphocytes Relative: 42.7 % (ref 12.0–46.0)
MCHC: 33 g/dL (ref 30.0–36.0)
MCV: 86.4 fL (ref 78.0–100.0)
Monocytes Absolute: 1.5 10*3/uL — ABNORMAL HIGH (ref 0.2–0.7)
Monocytes Relative: 18.4 % — ABNORMAL HIGH (ref 3.0–11.0)
Neutro Abs: 2.6 10*3/uL (ref 1.4–7.7)
Neutrophils Relative %: 32.6 % — ABNORMAL LOW (ref 43.0–77.0)
Platelets: 260 10*3/uL (ref 150–400)
Potassium: 3.9 meq/L (ref 3.5–5.1)
Potassium: 4 meq/L (ref 3.5–5.1)
Potassium: 4.1 meq/L (ref 3.5–5.1)
RBC: 4.3 M/uL (ref 3.87–5.11)
RDW: 14.3 % (ref 11.5–14.6)
Sodium: 142 meq/L (ref 135–145)
TSH: 0.92 microintl units/mL (ref 0.35–5.50)
WBC: 8.1 10*3/uL (ref 4.5–10.5)

## 2010-06-30 NOTE — Progress Notes (Signed)
Summary: refills  Phone Note Call from Patient Call back at Work Phone [phone]   Caller: Patient Summary of Call: Pt called and stated that she was on Ambien and Xanax at one point. Xanax has been removed from her medlist. She would like a refill on both. [facility] [address] [address]   Initial call taken by: Army Fossa CMA,  March 03, 2010 3:43 PM  Follow-up for Phone Call        have no record of the Ambien, would need visit to discuss sleep.  ok for alprazolam 0.5 mg as needed every 4-6 hrs for sever anxiety.  #30, no refills. Follow-up by: Neena Rhymes MD,  March 03, 2010 3:47 PM  Additional Follow-up for Phone Call Additional follow up Details #1::        Pt is aware- will call for appt to discuss ambien. [facility] [address]  Additional Follow-up by: Army Fossa CMA,  March 03, 2010 3:50 PM    New/Updated Medications: ALPRAZOLAM 0.5 MG TABS (ALPRAZOLAM) as needed every 4-6 hrs for sever anxiety. Prescriptions: ALPRAZOLAM 0.5 MG TABS (ALPRAZOLAM) as needed every 4-6 hrs for sever anxiety.  #30 x 0   Entered by:   Army Fossa CMA   Authorized by:   Neena Rhymes MD   Signed by:   Army Fossa CMA on 03/03/2010   Method used:   Printed then faxed to ...       [facility] [phone]* (retail)       [address]       [place], [place]  [address]       Ph: [phone]       Fax: [phone]   RxID:   [id number]

## 2010-06-30 NOTE — Discharge Summary (Signed)
[name], [name]               ACCOUNT NO.:  [id number]  MEDICAL RECORD NO.:  [id number]          PATIENT TYPE:  OBV  LOCATION:  3741                         FACILITY:  [facility]  PHYSICIAN:  Ladell Pier, M.D.   DATE OF BIRTH:  [birth year]  DATE OF ADMISSION:  06/11/2010 DATE OF DISCHARGE:  06/13/2010                              DISCHARGE SUMMARY   DISCHARGE DIAGNOSES: 1. Shortness of breath, question asthma exacerbation with negative CT     of the chest, ruled out pulmonary embolism, and 2D echo pending.     The patient to follow up with PCP in 1-2 days. 2. Cough/asthma/shortness of breath. 3. Hypokalemia. 4. Anemia. 5. Hypertension. 6. Hyperlipidemia. 7. History of breast cancer, status post mastectomy on the right in     1995 and has been released by her GYN Dr. Thomasena Edis, biopsy on the     left side as well. 8. Knee replacement in 2010. 9. Status post hysterectomy.  DISCHARGE MEDICATIONS: 1. Tussionex 5 mL q.12 p.r.n. 2. Prednisone taper. 3. Z-PAK. 4. Alprazolam 0.5 mg every 4 hours as needed. 5. Caltrate twice daily. 6. Fish oil 1000 daily. 7. Flonase nasal spray daily. 8. Lasix 20 mg to take 1 tablet every morning and additional tab at     night if needed for swelling. 9. Meloxicam 15 mg daily. 10.Mucinex/dextromethorphan 1 tablet daily as needed. 11.Nexium 40 mg daily. 12.Ventolin inhaler every 4 hours as needed. 13.Vitamin E daily.  FOLLOWUP APPOINTMENTS:  The patient to follow up with Dr. Beverely Low in 1-2 days.  PROCEDURES:  2-D echo results still pending.  CT angio of the chest; no PE, no ascending aortic dissection, prominent ductus bump, heart size within normal limits, no pericardial effusion, mild coronary calcification, surgical clip right breast region, thickening of the right breast skin.  This may be related to treatment of tumor, prior surgery.  Clinical correlation recommended.  Right axillary lymph node appeared elongated but with a fatty  center.  No mediastinal or hilar adenopathy, bibasilar segmental atelectasis, probably scarring. Atelectasis medial aspect of the right middle lobe.  No focal pulmonary mass.  X-ray of the soft tissue of the neck.  Chest x-ray, mild basilar atelectasis.  Neck x-ray, no acute findings.  CONSULTANTS:  None.  HISTORY OF PRESENT ILLNESS:  The patient is a 55 year old Caucasian female with a known history of hypertension, depression, obesity, status post left knee replacement who presents for cough and difficulty breathing.  This morning, she presented to the ED.  Her primary care physician is Dr. Thomasena Edis.  She states that her 9 year old daughter has been sick and she could tell us that the cough was more when she was trying to sleep.  Please see admission note for remainder of history, past medical history, family history, social history, meds, allergies as per admission H and P.  PHYSICAL EXAMINATION AT THE TIME OF DISCHARGE:  VITAL SIGNS: Temperature 98.4, pulse of 87, respirations 18, blood pressure 107/68, pulse ox 100% on room air. GENERAL:  The patient is sitting up in bed, well-nourished white female. HEENT:  Normocephalic, atraumatic.  Pupils reactive to light.  Throat without  erythema. CARDIOVASCULAR:  Regular rate and rhythm. LUNGS:  Clear bilaterally. ABDOMEN:  Positive bowel sounds. EXTREMITIES:  No edema.  HOSPITAL COURSE: 1. Asthma exacerbation/dyspnea:  The patient was admitted to the     hospital, started on steroid.  She was discharged with Z-PAK.  She     was also given breathing treatments.  Today, she states that her     shortness of breath has resolved.  She had a 2-D echo done also     that is still pending.  She will follow up with Dr. Beverely Low, primary     care doctor in 1-2 days.  The patient told to call to schedule     appointment. 2. Hypertension:  She will continue all her home medications.  DISCHARGE LABS:  Blood cultures negative x2.  Alk phos 60, AST  24, ALT 18, D-dimer 0.67.  Cardiac markers negative.  WBC 9.9, hemoglobin 10.8, platelet of 227, MCV 86.6.  Time spent with the patient and doing this discharge is approximately 35 minutes.     Ladell Pier, M.D.     [place]/MEDQ  D:  06/13/2010  T:  06/14/2010  Job:  [id number]  cc:   Dr. Beverely Low  Electronically Signed by Ladell Pier M.D. on 06/30/2010 01:38:00 PM

## 2010-06-30 NOTE — Assessment & Plan Note (Signed)
Vital Signs:  Patient profile:   55 year old female Weight:      281 pounds BMI:     44.84 Pulse rate:   111 / minute BP sitting:   122 / 80  (left arm)  Vitals Entered By: Doristine Devoid CMA (May 18, 2010 4:35 PM) CC: f/u on med    History of Present Illness: 55 yo woman here today to f/u on depression.  reports when she takes it in the AM she felt tired but when she takes it at night it 'makes me wired'.  also has gained 10 lbs.  denies excessive eating, change in diet, holiday parties etc.  pt asking about Zoloft- she has taken this previously.  admits that her mood is improved- 'i'm actually looking forward to the holidays'.  Current Medications (verified): 1)  Nexium 40 Mg Cpdr (Esomeprazole Magnesium) .Marland Kitchen.. 1 By Mouth Once Daily Prn 2)  Caltrate 600 1500 Mg Tabs (Calcium Carbonate) .... Take 1 Tablet By Mouth Once A Day 3)  Flonase 50 Mcg/act Susp (Fluticasone Propionate) .... 2 Sprays Each Nostril Daily 4)  Zyrtec Allergy 10 Mg  Tabs (Cetirizine Hcl) .Marland Kitchen.. 1 By Mouth Once Daily 5)  Proair Hfa 108 (90 Base) Mcg/act  Aers (Albuterol Sulfate) .Marland Kitchen.. 1-2 Sprays Q 4 Hours As Needed 6)  Meloxicam 7.5 Mg Tabs (Meloxicam) .... Take One Tablet Daily As Needed 7)  Zolpidem Tartrate 10 Mg  Tabs (Zolpidem Tartrate) .... 1/2 or 1 By Mouth At [facility] Prn 8)  Micardis Hct 80-25 Mg Tabs (Telmisartan-Hctz) .Marland Kitchen.. 1 Tab By Mouth Daily 9)  Fish Oil  Oil (Fish Oil) .... Take Daily 10)  B Complex-B12  Tabs (B Complex Vitamins) 11)  Furosemide 20 Mg Tabs (Furosemide) .... Take 1 Tab By Mouth Every Day, May Take 2nd Pill Nightly If Edema 12)  Klor-Con M20 20 Meq Cr-Tabs (Potassium Chloride Crys Cr) .Marland Kitchen.. 1 Tab By Mouth Daily 13)  Alprazolam 0.5 Mg Tabs (Alprazolam) .... As Needed Every 4-6 Hrs For Sever Anxiety. 14)  Venlafaxine Hcl 37.5 Mg Xr24h-Cap (Venlafaxine Hcl) .Marland Kitchen.. 1 Tab By Mouth Daily- May Increase To 2 Tabs Daily After 2 Weeks.  Allergies (verified): 1)  ! Pcn  Review of Systems      See  HPI  Physical Exam  General:  Well-developed,well-nourished,in no acute distress; alert,appropriate and cooperative throughout examination Psych:  no tears, much more interactive, able to smile, laugh, and even joke   Impression & Recommendations:  Problem # 1:  DEPRESSION (ICD-311) Assessment Unchanged pt's mood is much improved but having trouble tolerating med due to sleep side effects.  switch to zoloft as pt feels she has done well on this previously.  will follow. Her updated medication list for this problem includes:    Alprazolam 0.5 Mg Tabs (Alprazolam) .Marland Kitchen... As needed every 4-6 hrs for sever anxiety.    Zoloft 50 Mg Tabs (Sertraline hcl) .Marland Kitchen... 1/2 tab daily x1 week and then 1 tab by mouth daily  Complete Medication List: 1)  Nexium 40 Mg Cpdr (Esomeprazole magnesium) .Marland Kitchen.. 1 by mouth once daily prn 2)  Caltrate 600 1500 Mg Tabs (Calcium carbonate) .... Take 1 tablet by mouth once a day 3)  Flonase 50 Mcg/act Susp (Fluticasone propionate) .... 2 sprays each nostril daily 4)  Zyrtec Allergy 10 Mg Tabs (Cetirizine hcl) .Marland Kitchen.. 1 by mouth once daily 5)  Proair Hfa 108 (90 Base) Mcg/act Aers (Albuterol sulfate) .Marland Kitchen.. 1-2 sprays q 4 hours as needed 6)  Meloxicam  7.5 Mg Tabs (Meloxicam) .... Take one tablet daily as needed 7)  Zolpidem Tartrate 10 Mg Tabs (Zolpidem tartrate) .... 1/2 or 1 by mouth at hs prn 8)  Micardis Hct 80-25 Mg Tabs (Telmisartan-hctz) .Marland Kitchen.. 1 tab by mouth daily 9)  Fish Oil Oil (Fish oil) .... Take daily 10)  B Complex-b12 Tabs (B complex vitamins) 11)  Furosemide 20 Mg Tabs (Furosemide) .... Take 1 tab by mouth every day, may take 2nd pill nightly if edema 12)  Klor-con M20 20 Meq Cr-tabs (Potassium chloride crys cr) .Marland Kitchen.. 1 tab by mouth daily 13)  Alprazolam 0.5 Mg Tabs (Alprazolam) .... As needed every 4-6 hrs for sever anxiety. 14)  Zoloft 50 Mg Tabs (Sertraline hcl) .... 1/2 tab daily x1 week and then 1 tab by mouth daily  Patient Instructions: 1)  Follow up  in 1 month to recheck mood and meds 2)  Start the Zoloft daily. 3)  If it makes you tired, take it at night 4)  STOP the Venlafaxine (Effexor) 5)  Call with any questions or concerns 6)  HAPPY HOLIDAYS!! Prescriptions: ZOLOFT 50 MG TABS (SERTRALINE HCL) 1/2 tab daily x1 week and then 1 tab by mouth daily  #30 x 3   Entered and Authorized by:   Neena Rhymes MD   Signed by:   Neena Rhymes MD on 05/18/2010   Method used:   Electronically to        [facility] [phone]* (retail)       [address]       [place], [place]  [address]       Ph: [phone]       Fax: [phone]   RxID:   [id number]    Orders Added: 1)  Est. Patient Level III [18841]

## 2010-06-30 NOTE — Assessment & Plan Note (Signed)
Summary: swollen all over//taking bp and lasix//lch   Vital Signs:  Patient profile:   55 year old female Weight:      263 pounds Pulse rate:   88 / minute BP sitting:   126 / 80  (left arm)  Vitals Entered By: Doristine Devoid (November 01, 2009 2:53 PM) CC: fluid retention has been taking furosemide and some chest discomfort    History of Present Illness: 55 yo woman here today for fluid retention.  sxs started 3 weeks ago.  swelling of both hands and feet- also feels face and abd are swollen.  took furosemide last week (left over 20mg  pill) w/out relief, also developed leg cramps after excessive urination.  swelling only mildly improves w/ elevation.  reports sxs worsened when the weather got hot.  has been ongoing problem since at least Oct 18, 2006.  also having L chest pain.  sxs started 3 weeks ago but today was more intense.  pain is a throbbing pain on either side of L breast.  improves when lying down.  denies SOB, diaphoresis, N/V.  Allergies (verified): 1)  ! Pcn  Past History:  Past Medical History: Last updated: 04/29/2009 Depression after husband's death 10/17/01 GERD Hypertension Osteoarthritis Allergic rhinitis Breast Cancer 1995 Current Problems:  VIRAL MENINGITIS, HX OF (ICD-V12.49) FAMILY HISTORY BREAST CANCER 1ST DEGREE RELATIVE <50 (ICD-V16.3) ADVERSE DRUG REACTION (ICD-995.20) INSOMNIA-SLEEP DISORDER-UNSPEC (ICD-780.52) VITAMIN D DEFICIENCY (ICD-268.9) GENERALIZED PAIN (ICD-780.96) THUMB PAIN, LEFT (ICD-729.5) HYPERPARATHYROIDISM, HX OF (ICD-V12.2) ANXIETY STATE, UNSPECIFIED (ICD-300.00) ESOPHAGEAL REFLUX (ICD-530.81) DYSPNEA/SHORTNESS OF BREATH (ICD-786.09) ALLERGIC RHINITIS (ICD-477.9) URI (ICD-465.9) OSTEOARTHRITIS (ICD-715.90) TOE PAIN (ICD-729.5) DEPENDENT EDEMA, LEGS (ICD-782.3) HYPERTENSION (ICD-401.9) GERD (ICD-530.81) DEPRESSION (ICD-311)  Review of Systems      See HPI  Physical Exam  General:  Well-developed,well-nourished,in no acute distress;  alert,appropriate and cooperative throughout examination Chest Wall:  reproducible chest pain on either side of L breast Lungs:  Normal respiratory effort, chest expands symmetrically. Lungs are clear to auscultation, no crackles or wheezes. Heart:  Normal rate and regular rhythm. S1 and S2 normal without gallop, murmur, click, rub or other extra sounds. Abdomen:  Bowel sounds positive,abdomen soft and non-tender without masses, organomegaly or hernias noted.  no fluid wave or ascites Pulses:  +2 carotid, radial, DP Extremities:  +1 edema of feet and ankles bilaterally   Impression & Recommendations:  Problem # 1:  DEPENDENT EDEMA, LEGS (ICD-782.3) Assessment Deteriorated pt's edema again worsening w/ increased heat.  no crackles on lung exam or ascites to suggest CHF.  pt reports complete cards eval w/in the last 5 yrs.  start lasix regularly but check baseline K+.  reviewed supportive care and red flags that should prompt return.  Pt expresses understanding and is in agreement w/ this plan. Her updated medication list for this problem includes:    Micardis Hct 80-25 Mg Tabs (Telmisartan-hctz) .Marland Kitchen... 1 tab by mouth daily    Furosemide 20 Mg Tabs (Furosemide) .Marland Kitchen... Take 1 tab by mouth every day, may take 2nd pill nightly if edema  Orders: Venipuncture (54098) TLB-BMP (Basic Metabolic Panel-BMET) (80048-METABOL)  Problem # 2:  CHEST PAIN (ICD-786.50) Assessment: New  pt's pain is reproducible on either side of L breast.  likely suspensory ligament strain due to increased fluid retention and fullness of breast.  EKG normal.  stressed importance of supportive bra.  pt aware and in agreement.  Orders: EKG w/ Interpretation (93000)  Complete Medication List: 1)  Nexium 40 Mg Cpdr (Esomeprazole magnesium) .Marland Kitchen.. 1 by mouth once daily  prn 2)  Caltrate 600 1500 Mg Tabs (Calcium carbonate) .... Take 1 tablet by mouth once a day 3)  Flonase 50 Mcg/act Susp (Fluticasone propionate) .... 2 sprays  each nostril daily 4)  Zyrtec Allergy 10 Mg Tabs (Cetirizine hcl) .Marland Kitchen.. 1 by mouth once daily 5)  Proair Hfa 108 (90 Base) Mcg/act Aers (Albuterol sulfate) .Marland Kitchen.. 1-2 sprays q 4 hours as needed 6)  Meloxicam 7.5 Mg Tabs (Meloxicam) .... Take one tablet daily as needed 7)  Zolpidem Tartrate 10 Mg Tabs (Zolpidem tartrate) .... 1/2 or 1 by mouth at hs prn 8)  Micardis Hct 80-25 Mg Tabs (Telmisartan-hctz) .Marland Kitchen.. 1 tab by mouth daily 9)  Fish Oil Oil (Fish oil) .... Take daily 10)  B Complex-b12 Tabs (B complex vitamins) 11)  Furosemide 20 Mg Tabs (Furosemide) .... Take 1 tab by mouth every day, may take 2nd pill nightly if edema 12)  Klor-con M20 20 Meq Cr-tabs (Potassium chloride crys cr) .Marland Kitchen.. 1 tab by mouth daily  Patient Instructions: 1)  Please schedule a follow-up appointment in 3 weeks to recheck swelling and pain. 2)  Take the Lasix daily- take a 2nd pill at night for increased swelling 3)  Take the Potassium daily as directed 4)  Drink plenty of fluids to stay well hydrated 5)  Ibuprofen as needed for the breast pain 6)  Call with any questions or concerns 7)  Hang in there! Prescriptions: KLOR-CON M20 20 MEQ CR-TABS (POTASSIUM CHLORIDE CRYS CR) 1 tab by mouth daily  #30 x 3   Entered and Authorized by:   Neena Rhymes MD   Signed by:   Neena Rhymes MD on 11/01/2009   Method used:   Electronically to        [facility] [phone]* (retail)       [address]       [place], [place]  [address]       Ph: [phone]       Fax: [phone]   RxID:   [id number] FUROSEMIDE 20 MG TABS (FUROSEMIDE) Take 1 tab by mouth every day, may take 2nd pill nightly if edema  #60 x 3   Entered and Authorized by:   Neena Rhymes MD   Signed by:   Neena Rhymes MD on 11/01/2009   Method used:   Electronically to        [facility] [phone]* (retail)       [address]       [place], [place]  [address]       Ph: [phone]        Fax: [phone]   RxID:   [id number]

## 2010-06-30 NOTE — Assessment & Plan Note (Signed)
Summary: discuss depression//fd  Flu Vaccine Consent Questions     Do you have a history of severe allergic reactions to this vaccine? no    Any prior history of allergic reactions to egg and/or gelatin? no    Do you have a sensitivity to the preservative Thimersol? no    Do you have a past history of Guillan-Barre Syndrome? no    Do you currently have an acute febrile illness? no    Have you ever had a severe reaction to latex? no    Vaccine information given and explained to patient? yes    Are you currently pregnant? no    Lot Number:[id number]   Exp Date:11/26/2010   Site Given  Left Deltoid IM    Vital Signs:  Patient profile:   55 year old female Height:      66.5 inches Weight:      272 pounds BMI:     43.40 BP sitting:   132 / 80  (left arm)  Vitals Entered By: Doristine Devoid CMA (March 28, 2010 3:02 PM) CC: f/u on depression    History of Present Illness: 55 yo woman here today for depression.  reports sxs have never really resolved.  aunt just recently passed.  mother is getting remarried at 49.  daughter is not well.  pt is working 2 jobs.  feels isolated.  had to commit ex-boyfriend after 'he lost his mind' when his dad died.  fears letting people get close to her- 'i don't want to lose someone else'.  has had thoughts of dying- denies SI, 'i couldn't do that'.    Current Medications (verified): 1)  Nexium 40 Mg Cpdr (Esomeprazole Magnesium) .Marland Kitchen.. 1 By Mouth Once Daily Prn 2)  Caltrate 600 1500 Mg Tabs (Calcium Carbonate) .... Take 1 Tablet By Mouth Once A Day 3)  Flonase 50 Mcg/act Susp (Fluticasone Propionate) .... 2 Sprays Each Nostril Daily 4)  Zyrtec Allergy 10 Mg  Tabs (Cetirizine Hcl) .Marland Kitchen.. 1 By Mouth Once Daily 5)  Proair Hfa 108 (90 Base) Mcg/act  Aers (Albuterol Sulfate) .Marland Kitchen.. 1-2 Sprays Q 4 Hours As Needed 6)  Meloxicam 7.5 Mg Tabs (Meloxicam) .... Take One Tablet Daily As Needed 7)  Zolpidem Tartrate 10 Mg  Tabs (Zolpidem Tartrate) .... 1/2 or 1 By Mouth At  [facility] Prn 8)  Micardis Hct 80-25 Mg Tabs (Telmisartan-Hctz) .Marland Kitchen.. 1 Tab By Mouth Daily 9)  Fish Oil  Oil (Fish Oil) .... Take Daily 10)  B Complex-B12  Tabs (B Complex Vitamins) 11)  Furosemide 20 Mg Tabs (Furosemide) .... Take 1 Tab By Mouth Every Day, May Take 2nd Pill Nightly If Edema 12)  Klor-Con M20 20 Meq Cr-Tabs (Potassium Chloride Crys Cr) .Marland Kitchen.. 1 Tab By Mouth Daily 13)  Alprazolam 0.5 Mg Tabs (Alprazolam) .... As Needed Every 4-6 Hrs For Sever Anxiety. 14)  Venlafaxine Hcl 37.5 Mg Xr24h-Cap (Venlafaxine Hcl) .Marland Kitchen.. 1 Tab By Mouth Daily- May Increase To 2 Tabs Daily After 2 Weeks.  Allergies (verified): 1)  ! Pcn  Past History:  Past medical, surgical, family and social histories (including risk factors) reviewed, and no changes noted (except as noted below).  Past Medical History: Reviewed history from 04/29/2009 and no changes required. Depression after husband's death October 30, 2001 GERD Hypertension Osteoarthritis Allergic rhinitis Breast Cancer 1995 Current Problems:  VIRAL MENINGITIS, HX OF (ICD-V12.49) FAMILY HISTORY BREAST CANCER 1ST DEGREE RELATIVE <50 (ICD-V16.3) ADVERSE DRUG REACTION (ICD-995.20) INSOMNIA-SLEEP DISORDER-UNSPEC (ICD-780.52) VITAMIN D DEFICIENCY (ICD-268.9) GENERALIZED PAIN (ICD-780.96) THUMB PAIN,  LEFT (ICD-729.5) HYPERPARATHYROIDISM, HX OF (ICD-V12.2) ANXIETY STATE, UNSPECIFIED (ICD-300.00) ESOPHAGEAL REFLUX (ICD-530.81) DYSPNEA/SHORTNESS OF BREATH (ICD-786.09) ALLERGIC RHINITIS (ICD-477.9) URI (ICD-465.9) OSTEOARTHRITIS (ICD-715.90) TOE PAIN (ICD-729.5) DEPENDENT EDEMA, LEGS (ICD-782.3) HYPERTENSION (ICD-401.9) GERD (ICD-530.81) DEPRESSION (ICD-311)  Past Surgical History: Reviewed history from 04/29/2009 and no changes required. Tran flap for breast cancer ;  bilat knee arthroscopy ; parathyroid resection  for hypercalcemia Total knee replacement (06/18/2008)--daldorf Mastectomy ([id number])-- R breast septoplasty--  1980 Tonsillectomy Parathyroidectomy hand surgery---dog bite-- R hand hand surgery--trauma==L hand Tumor R big toe-- 1982 rectal abscess Hysterectomy-- [facility] BSO secondary to fibroids and 1998 Caesarean section (1988)  Family History: Reviewed history from 04/29/2009 and no changes required. Family History Breast cancer 1st degree relative <50 Family History Ovarian cancer colon polyps  Social History: Reviewed history from 09/22/2009 and no changes required. nonsmoker, daughter, widowed occ. etoh re-hired at old job after getting laid off Occupation:  [employer] Single Widow/Widower Never Smoked Alcohol use-yes Drug use-no Regular exercise-yes  Review of Systems      See HPI  Physical Exam  General:  intermittantly tearful throughout conversation Psych:  tearful, subdued   Impression & Recommendations:  Problem # 1:  DEPRESSION (ICD-311) Assessment Deteriorated will start effexor for pt's mood and fatigue.  strongly encouraged her to start counseling to deal w/ her feelings of isolation, loss, unresolved grief.  pt able to contract for safety.  spent 32 minutes w/ pt, >50% spent counseling. Her updated medication list for this problem includes:    Alprazolam 0.5 Mg Tabs (Alprazolam) .Marland Kitchen... As needed every 4-6 hrs for sever anxiety.    Venlafaxine Hcl 37.5 Mg Xr24h-cap (Venlafaxine hcl) .Marland Kitchen... 1 tab by mouth daily- may increase to 2 tabs daily after 2 weeks.  Complete Medication List: 1)  Nexium 40 Mg Cpdr (Esomeprazole magnesium) .Marland Kitchen.. 1 by mouth once daily prn 2)  Caltrate 600 1500 Mg Tabs (Calcium carbonate) .... Take 1 tablet by mouth once a day 3)  Flonase 50 Mcg/act Susp (Fluticasone propionate) .... 2 sprays each nostril daily 4)  Zyrtec Allergy 10 Mg Tabs (Cetirizine hcl) .Marland Kitchen.. 1 by mouth once daily 5)  Proair Hfa 108 (90 Base) Mcg/act Aers (Albuterol sulfate) .Marland Kitchen.. 1-2 sprays q 4 hours as needed 6)  Meloxicam 7.5 Mg Tabs (Meloxicam) .... Take one tablet  daily as needed 7)  Zolpidem Tartrate 10 Mg Tabs (Zolpidem tartrate) .... 1/2 or 1 by mouth at hs prn 8)  Micardis Hct 80-25 Mg Tabs (Telmisartan-hctz) .Marland Kitchen.. 1 tab by mouth daily 9)  Fish Oil Oil (Fish oil) .... Take daily 10)  B Complex-b12 Tabs (B complex vitamins) 11)  Furosemide 20 Mg Tabs (Furosemide) .... Take 1 tab by mouth every day, may take 2nd pill nightly if edema 12)  Klor-con M20 20 Meq Cr-tabs (Potassium chloride crys cr) .Marland Kitchen.. 1 tab by mouth daily 13)  Alprazolam 0.5 Mg Tabs (Alprazolam) .... As needed every 4-6 hrs for sever anxiety. 14)  Venlafaxine Hcl 37.5 Mg Xr24h-cap (Venlafaxine hcl) .Marland Kitchen.. 1 tab by mouth daily- may increase to 2 tabs daily after 2 weeks.  Other Orders: Admin 1st Vaccine (86578) Flu Vaccine 11yrs + [phone])  Patient Instructions: 1)  Follow up in 1 month to discuss new meds 2)  Consider calling Hospice [phone] to set up counseling- I think a lot of this is due to unresolved grief, feelings of isolation and abandonment. 3)  Start the Venlafaxine in the mornings- take w/ food 4)  Call with any questions or concerns 5)  Hang in there! Prescriptions: VENLAFAXINE HCL 37.5 MG XR24H-CAP (VENLAFAXINE HCL) 1 tab by mouth daily- may increase to 2 tabs daily after 2 weeks.  #60 x 1   Entered and Authorized by:   Neena Rhymes MD   Signed by:   Neena Rhymes MD on 03/28/2010   Method used:   [facility]        [facility] [phone][facility] (retail)       [address]       [place], [place]  [address]       Ph: [phone]       Fax: [phone]   RxID:   [id number]    Orders Added: 1)  Admin 1st Vaccine [90471] 2)  Flu Vaccine 16yrs + [24401] 3)  Est. Patient Level IV [02725]

## 2010-06-30 NOTE — Progress Notes (Signed)
Summary: stop med/reaction  Phone Note Call from Patient   Caller: Patient Summary of Call: pt left VM that she is still having tremors that she discuss with you and dr Beverely Low. pt thinks that it may be coming form wellbutrin and would like to know if it would be ok to stop this med to see if tremor will go away. pt states that she was informed that it could be possible her nerve but would like to rule out anything that could be contributing to this. dr Laury Axon pls advise................Marland KitchenFelecia Deloach CMA  June 28, 2009 11:36 AM   Follow-up for Phone Call        have to decrease wellbutrin to 150 mg daily--  for at least 2 weeks  call in #30  then can stop Follow-up by: Loreen Freud DO,  June 28, 2009 12:28 PM  Additional Follow-up for Phone Call Additional follow up Details #1::        left message to call office..................Marland KitchenFelecia Deloach CMA  June 28, 2009 2:06 PM     Additional Follow-up for Phone Call Additional follow up Details #2::    pt aware will decrease to 150 mg and will call if she has any issues.....................Marland KitchenFelecia Deloach CMA  June 28, 2009 3:19 PM   New/Updated Medications: WELLBUTRIN XL 300 MG  TB24 (BUPROPION HCL) 1/2 tab once daily

## 2010-06-30 NOTE — Progress Notes (Signed)
Summary: Medication Side Effects  Phone Note Call from Patient Call back at [phone] until 5p   Caller: Patient Call For: Neena Rhymes MD Summary of Call: Patient was put on Effexor two weeks ago and it is making her very sleepy.  She thought it would give her lots of energy.  Do you think the side effects will change?  Do you think she should continue taking it? Please advise. Initial call taken by: Barnie Mort,  April 11, 2010 9:12 AM  Follow-up for Phone Call        if it is causing fatigue she should switch it to bed time.  w/ continued use this side effect should improve.  she can continue taking it, but rather than increasing it after 2 weeks she should continue at the starting dose. Follow-up by: Neena Rhymes MD,  April 11, 2010 9:24 AM  Additional Follow-up for Phone Call Additional follow up Details #1::        spoke w/ patient aware of information and recommendations.....Marland KitchenMarland KitchenDoristine Devoid CMA  April 11, 2010 11:53 AM

## 2010-06-30 NOTE — Progress Notes (Signed)
Summary: micardis refill   Phone Note Refill Request Message from:  Fax from Pharmacy on March 25, 2010 8:28 AM  Refills Requested: Medication #1:  MICARDIS HCT 80-25 MG TABS 1 tab by mouth daily [facility] [phone]  Initial call taken by: Okey Regal Spring,  March 25, 2010 8:29 AM    Prescriptions: MICARDIS HCT 80-25 MG TABS (TELMISARTAN-HCTZ) 1 tab by mouth daily  #90 x 0   Entered by:   Doristine Devoid CMA   Authorized by:   Neena Rhymes MD   Signed by:   Doristine Devoid CMA on 03/25/2010   Method used:   Faxed to ...       [facility]order)       [address] [place], [place]  [address]       Ph: [phone]       Fax: [phone]   RxID:   [id number]

## 2010-06-30 NOTE — Progress Notes (Signed)
Summary: schedule appt  ---- Converted from flag ---- ---- 03/18/2010 9:05 AM, Neena Rhymes MD wrote: at some point, please call her and have her arrange appt for her depression.  according to consult note from [facility] this is severe. ------------------------------  Phone Note Outgoing Call   Call placed by: Doristine Devoid CMA,  March 21, 2010 8:20 AM Call placed to: Patient Summary of Call: left message on machine.Marland KitchenMarland KitchenDoristine Devoid CMA  March 21, 2010 8:21 AM   Follow-up for Phone Call        pt has appt scheduled for 03-28-10............Marland KitchenFelecia Deloach CMA  March 21, 2010 11:22 AM

## 2010-06-30 NOTE — Assessment & Plan Note (Signed)
Summary: rto 3 weeks.cbs   Vital Signs:  Patient profile:   55 year old female Weight:      262 pounds Pulse rate:   84 / minute BP sitting:   110 / 74  (left arm)  Vitals Entered By: Doristine Devoid (November 22, 2009 1:23 PM) CC: 3 week roa    History of Present Illness: 55 yo woman here today for f/u on  1) edema- much improved, no longer tight or painful.  needs BMP due to lasix use  2) breast pain- resolved.  pt bought new bra and the support helped immensely.  Current Medications (verified): 1)  Nexium 40 Mg Cpdr (Esomeprazole Magnesium) .Marland Kitchen.. 1 By Mouth Once Daily Prn 2)  Caltrate 600 1500 Mg Tabs (Calcium Carbonate) .... Take 1 Tablet By Mouth Once A Day 3)  Flonase 50 Mcg/act Susp (Fluticasone Propionate) .... 2 Sprays Each Nostril Daily 4)  Zyrtec Allergy 10 Mg  Tabs (Cetirizine Hcl) .Marland Kitchen.. 1 By Mouth Once Daily 5)  Proair Hfa 108 (90 Base) Mcg/act  Aers (Albuterol Sulfate) .Marland Kitchen.. 1-2 Sprays Q 4 Hours As Needed 6)  Meloxicam 7.5 Mg Tabs (Meloxicam) .... Take One Tablet Daily As Needed 7)  Zolpidem Tartrate 10 Mg  Tabs (Zolpidem Tartrate) .... 1/2 or 1 By Mouth At [facility] Prn 8)  Micardis Hct 80-25 Mg Tabs (Telmisartan-Hctz) .Marland Kitchen.. 1 Tab By Mouth Daily 9)  Fish Oil  Oil (Fish Oil) .... Take Daily 10)  B Complex-B12  Tabs (B Complex Vitamins) 11)  Furosemide 20 Mg Tabs (Furosemide) .... Take 1 Tab By Mouth Every Day, May Take 2nd Pill Nightly If Edema 12)  Klor-Con M20 20 Meq Cr-Tabs (Potassium Chloride Crys Cr) .Marland Kitchen.. 1 Tab By Mouth Daily  Allergies (verified): 1)  ! Pcn  Past History:  Past Medical History: Last updated: 04/29/2009 Depression after husband's death 10/11/01 GERD Hypertension Osteoarthritis Allergic rhinitis Breast Cancer 1995 Current Problems:  VIRAL MENINGITIS, HX OF (ICD-V12.49) FAMILY HISTORY BREAST CANCER 1ST DEGREE RELATIVE <50 (ICD-V16.3) ADVERSE DRUG REACTION (ICD-995.20) INSOMNIA-SLEEP DISORDER-UNSPEC (ICD-780.52) VITAMIN D DEFICIENCY  (ICD-268.9) GENERALIZED PAIN (ICD-780.96) THUMB PAIN, LEFT (ICD-729.5) HYPERPARATHYROIDISM, HX OF (ICD-V12.2) ANXIETY STATE, UNSPECIFIED (ICD-300.00) ESOPHAGEAL REFLUX (ICD-530.81) DYSPNEA/SHORTNESS OF BREATH (ICD-786.09) ALLERGIC RHINITIS (ICD-477.9) URI (ICD-465.9) OSTEOARTHRITIS (ICD-715.90) TOE PAIN (ICD-729.5) DEPENDENT EDEMA, LEGS (ICD-782.3) HYPERTENSION (ICD-401.9) GERD (ICD-530.81) DEPRESSION (ICD-311)  Review of Systems      See HPI  Physical Exam  General:  Well-developed,well-nourished,in no acute distress; alert,appropriate and cooperative throughout examination Lungs:  Normal respiratory effort, chest expands symmetrically. Lungs are clear to auscultation, no crackles or wheezes. Heart:  Normal rate and regular rhythm. S1 and S2 normal without gallop, murmur, click, rub or other extra sounds. Pulses:  +2 carotid, radial, DP Extremities:  non pitting edema of feet and ankles bilaterally (improved from last visit)   Impression & Recommendations:  Problem # 1:  DEPENDENT EDEMA, LEGS (ICD-782.3) Assessment Improved continue the lasix for edema as needed.  check labs to ensure K+ normal.  reviewed lifestyle modifications- salt restrictions, elevation, water intake.  pt in agreement. Her updated medication list for this problem includes:    Micardis Hct 80-25 Mg Tabs (Telmisartan-hctz) .Marland Kitchen... 1 tab by mouth daily    Furosemide 20 Mg Tabs (Furosemide) .Marland Kitchen... Take 1 tab by mouth every day, may take 2nd pill nightly if edema  Orders: Venipuncture ([address]) TLB-BMP (Basic Metabolic Panel-BMET) (80048-METABOL)  Problem # 2:  CHEST PAIN (ICD-786.50) Assessment: Improved resolved w/ purchase of new bra.  Complete Medication List: 1)  Nexium 40 Mg Cpdr (Esomeprazole magnesium) .Marland Kitchen.. 1 by mouth once daily prn 2)  Caltrate 600 1500 Mg Tabs (Calcium carbonate) .... Take 1 tablet by mouth once a day 3)  Flonase 50 Mcg/act Susp (Fluticasone propionate) .... 2 sprays each nostril  daily 4)  Zyrtec Allergy 10 Mg Tabs (Cetirizine hcl) .Marland Kitchen.. 1 by mouth once daily 5)  Proair Hfa 108 (90 Base) Mcg/act Aers (Albuterol sulfate) .Marland Kitchen.. 1-2 sprays q 4 hours as needed 6)  Meloxicam 7.5 Mg Tabs (Meloxicam) .... Take one tablet daily as needed 7)  Zolpidem Tartrate 10 Mg Tabs (Zolpidem tartrate) .... 1/2 or 1 by mouth at hs prn 8)  Micardis Hct 80-25 Mg Tabs (Telmisartan-hctz) .Marland Kitchen.. 1 tab by mouth daily 9)  Fish Oil Oil (Fish oil) .... Take daily 10)  B Complex-b12 Tabs (B complex vitamins) 11)  Furosemide 20 Mg Tabs (Furosemide) .... Take 1 tab by mouth every day, may take 2nd pill nightly if edema 12)  Klor-con M20 20 Meq Cr-tabs (Potassium chloride crys cr) .Marland Kitchen.. 1 tab by mouth daily  Patient Instructions: 1)  Follow up for your physical in December 2)  Continue the Lasix as needed for the swelling 3)  We'll notify you of your lab work 4)  Call with any questions or concerns 5)  Have a great 4th of July!

## 2010-06-30 NOTE — Assessment & Plan Note (Signed)
Summary: cough,drainage/cbs   Vital Signs:  Patient profile:   55 year old female Height:      66.5 inches Weight:      258.13 pounds Temp:     99.5 degrees F oral Pulse rate:   88 / minute BP sitting:   100 / 60  Vitals Entered By: Kandice Hams (September 22, 2009 2:54 PM) CC: c/o sinus drainage, facial pain sneezing   History of Present Illness: 55 yo woman here today for ? sinus infxn.  sxs started over the weekend.  c/o facial pain and pressure, nasal congestion and drainage, tooth pain.  no fevers.  + sneezing but no coughing.  also reports that since her recent promotion she has noticed she has a very difficulty time focusing on tasks.  will start multiple tasks before finishing any one of them.  states she also has trouble at home staying task oriented and 'annoy myself' with her inability to focus.  wonders what's wrong with her.  Allergies (verified): 1)  ! Pcn  Social History: nonsmoker, daughter, widowed occ. 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 No apparent alopecia or balding.  + TTP over maxillary sinuses Eyes:  no injxn or inflammation Ears:  External ear exam shows no significant lesions or deformities.  Otoscopic examination reveals clear canals, tympanic membranes are intact bilaterally without bulging, retraction, inflammation or discharge. Hearing is grossly normal bilaterally. Nose:  + congestion and rhinorrhea Mouth:  + PND Neck:  No deformities, masses, or tenderness noted. Lungs:  Normal respiratory effort, chest expands symmetrically. Lungs are clear to auscultation, no crackles or wheezes. Heart:   Normal rate and regular rhythm. S1 and S2 normal without gallop, murmur, click, rub or other extra sounds.   Impression & Recommendations:  Problem # 1:  SINUSITIS - ACUTE-NOS (ICD-461.9) Assessment New maxillary sinusitis.  start doxy- pt has failed zpacks in past.  reviewed supportive care and red flags that should prompt return.  Pt expresses understanding and is in agreement w/ this plan. Her updated medication list for this problem includes:    Flonase 50 Mcg/act Susp (Fluticasone propionate) .Marland Kitchen... 2 sprays each nostril daily    Doxycycline Hyclate 100 Mg Caps (Doxycycline hyclate) .Marland Kitchen... Take 1 tab twice a day  Problem # 2:  ATTENTION DEFICIT DISORDER, INATTENTIVE TYPE (ICD-314.00) Assessment: New pt's reported sxs sound suspicious for ADD inattentive type.  will refer for complete psych eval and then have pt return for f/u. Orders: Psychology Referral (Psychology)  Complete Medication List: 1)  Nexium 40 Mg Cpdr (Esomeprazole magnesium) .Marland Kitchen.. 1 by mouth once daily prn 2)  Wellbutrin Xl 300 Mg Tb24 (Bupropion hcl) .... 1/2 tab once daily 3)  Caltrate 600 1500 Mg Tabs (Calcium carbonate) .... Take 1 tablet by mouth once a day 4)  Flonase 50 Mcg/act Susp (Fluticasone propionate) .... 2 sprays each nostril daily 5)  Zyrtec Allergy 10 Mg Tabs (Cetirizine hcl) .Marland Kitchen.. 1 by mouth once daily 6)  Proair Hfa 108 (90 Base) Mcg/act Aers (Albuterol sulfate) .Marland Kitchen.. 1-2 sprays q 4 hours as needed 7)  Meloxicam 7.5 Mg Tabs (Meloxicam) .... Take one tablet daily as needed 8)  Zolpidem Tartrate 10 Mg Tabs (Zolpidem tartrate) .Marland KitchenMarland KitchenMarland Kitchen  1/2 or 1 by mouth at hs prn 9)  Micardis Hct 80-25 Mg Tabs (Telmisartan-hctz) .Marland Kitchen.. 1 tab by mouth daily 10)  Alprazolam 0.5 Mg Tabs (Alprazolam) .Marland Kitchen.. 1 tab by mouth as needed for severe anxiety 11)  Zoloft 50 Mg Tabs (Sertraline hcl) .Marland Kitchen.. 1 by mouth once daily 12)  Doxycycline Hyclate 100 Mg Caps (Doxycycline hyclate) .... Take 1 tab twice a day  Patient Instructions: 1)   Schedule a follow up w/ me 2 weeks after testing 2)  Someone will call you with the names and #s to schedule testing 3)  Take the Doxycycline as directed for sinus infection- take w/ food! 4)  Restart the Flonase for allergies 5)  Drink plenty of fluids 6)  Hang in there!! Prescriptions: FLONASE 50 MCG/ACT SUSP (FLUTICASONE PROPIONATE) 2 sprays each nostril daily  #1 x 3   Entered and Authorized by:   Neena Rhymes MD   Signed by:   Neena Rhymes MD on 09/22/2009   Method used:   [facility] to        [facility] [phone]* (retail)       [address]       [place], [place]  [address]       Ph: [phone]       Fax: [phone]   RxID:   [id number] DOXYCYCLINE HYCLATE 100 MG CAPS (DOXYCYCLINE HYCLATE) Take 1 tab twice a day  #20 x 0   Entered and Authorized by:   Neena Rhymes MD   Signed by:   Neena Rhymes MD on 09/22/2009   Method used:   Electronically to        [facility] [phone]* (retail)       [address]       [place], [place]  [address]       Ph: [phone]       Fax: [phone]   RxID:   [id number]

## 2010-06-30 NOTE — Progress Notes (Signed)
Summary: [facility] XL  Phone Note Refill Request Message from:  Fax from Pharmacy on March 10, 2010 10:44 AM  Refills Requested: Medication #1:  WELLBUTRN XL TAB 300MG  [facility]  [phone]     THIS MED WAS REMOVED , THEN ADDED BACK, THEN REMOVED AGAIN ON 10/2009  Initial call taken by: Jerolyn Shin,  March 10, 2010 10:49 AM  Follow-up for Phone Call        med removed 11-01-09, never filled by this office, last ov 11-22-09.Pls advise...............Marland KitchenFelecia Deloach CMA  March 11, 2010 9:10 AM   Additional Follow-up for Phone Call Additional follow up Details #1::        please call pt and ask what she would like to do- have a note from January indicating she had tremors on Welbutrin and stopped b/c of this.  if she is again struggling w/ depression/anxiety i need to know if she is seeing a counselor- if not, will need an appt to discuss. Additional Follow-up by: Neena Rhymes MD,  March 11, 2010 10:40 AM    Additional Follow-up for Phone Call Additional follow up Details #2::    left message to call office......................Marland KitchenFelecia Deloach CMA  March 11, 2010 11:32 AM   Patient return call stating that she did not request med and does not need it because it cause her to have tremors.Request for med can be discarded now and for any future request..........Marland KitchenFelecia Deloach CMA  March 14, 2010 10:38 AM

## 2010-06-30 NOTE — Consult Note (Signed)
Summary: [facility]  [facility]   Imported By: Lanelle Bal 03/18/2010 08:29:30  _____________________________________________________________________  External Attachment:    Type:   Image     Comment:   External Document

## 2010-06-30 NOTE — Letter (Signed)
Summary: Letter to Patient/[facility]  Letter to Patient/[facility]   Imported By: Lanelle Bal 06/03/2009 09:04:36  _____________________________________________________________________  External Attachment:    Type:   Image     Comment:   External Document

## 2010-07-06 NOTE — Assessment & Plan Note (Signed)
Summary: Pulmonary Consultation   Copy to:  Dr. Neena Rhymes Primary Provider/Referring Provider:  Neena Rhymes MD  CC:  Pulmonary Consult - DOE, cough.  , and asthma.  History of Present Illness: Pulmonary Consultation       This is a 55 year old female who presents with asthma.  The patient complains of cough, shortness of breath, chest tightness, chest pain, wheezing, nocturnal awakening, exercise induced symptoms, and congestion, but denies history of diagnosed Asthma and mucous production.  Symptoms appear triggered by stress and exercise.  The patient also has the following associated problems: nasal congestion, difficulty breathing through nose, productive cough, non-productive cough, and chest tightness.  Previous effective treatment includes short acting beta-agonist:.    Pt ill since 1/14.  Pt noted acute dyspnea in AM,  EMS took pt to ED.  CT scan neg for PE Pt has been rx steroids/pred.  restricted airway.  Pt notes cough.  Pt notes cough is non prod, Pt feels  feels congested in lower airway is DOE.    hx of seasonal asthma on SABA in the past  rx prednisone rx 40mg /d x 7days started 1/23 Pt notes difficulty at night as well   Preventive Screening-Counseling & Management  Alcohol-Tobacco     Smoking Status: never  Current Medications (verified): 1)  Nexium 40 Mg Cpdr (Esomeprazole Magnesium) .Marland Kitchen.. 1 By Mouth Once Daily As Needed 2)  Caltrate 600 1500 Mg Tabs (Calcium Carbonate) .... Take 1 Tablet By Mouth Once A Day 3)  Flonase 50 Mcg/act Susp (Fluticasone Propionate) .... 2 Sprays Each Nostril Daily 4)  Proair Hfa 108 (90 Base) Mcg/act  Aers (Albuterol Sulfate) .Marland Kitchen.. 1-2 Sprays Q 4 Hours As Needed 5)  Meloxicam 15 Mg Tabs (Meloxicam) .... Take 1 Tablet By Mouth Once A Day 6)  Micardis Hct 80-25 Mg Tabs (Telmisartan-Hctz) .Marland Kitchen.. 1 Tab By Mouth Daily 7)  Fish Oil  Oil (Fish Oil) .... Take Daily 8)  B Complex-B12  Tabs (B Complex Vitamins) .... Take 1 Tablet By  Mouth Once A Day 9)  Furosemide 20 Mg Tabs (Furosemide) .... Take 1 Tab By Mouth Every Day, May Take 2nd Pill Nightly If Edema 10)  Alprazolam 0.5 Mg Tabs (Alprazolam) .... As Needed Every 4-6 Hrs For Sever Anxiety. 11)  Prednisone 20 Mg Tabs (Prednisone) .... 2 Tabs Daily X7 Days 12)  Tessalon 200 Mg Caps (Benzonatate) .... Take One Capsule By Mouth Three Times A Day As Needed For Cough 13)  Vitamin E 400 Unit Caps (Vitamin E) .... Take 1 Capsule By Mouth Once A Day  Allergies (verified): 1)  ! Pcn 2)  ! * Mucinex Dm   Past History:  Past medical, surgical, family and social histories (including risk factors) reviewed, and no changes noted (except as noted below).  Past Medical History: Reviewed history from 04/29/2009 and no changes required. Depression after husband's death Oct 16, 2001 GERD Hypertension Osteoarthritis Allergic rhinitis Breast Cancer 1995 Current Problems:  VIRAL MENINGITIS, HX OF (ICD-V12.49) FAMILY HISTORY BREAST CANCER 1ST DEGREE RELATIVE <50 (ICD-V16.3) ADVERSE DRUG REACTION (ICD-995.20) INSOMNIA-SLEEP DISORDER-UNSPEC (ICD-780.52) VITAMIN D DEFICIENCY (ICD-268.9) GENERALIZED PAIN (ICD-780.96) THUMB PAIN, LEFT (ICD-729.5) HYPERPARATHYROIDISM, HX OF (ICD-V12.2) ANXIETY STATE, UNSPECIFIED (ICD-300.00) ESOPHAGEAL REFLUX (ICD-530.81) DYSPNEA/SHORTNESS OF BREATH (ICD-786.09) ALLERGIC RHINITIS (ICD-477.9) URI (ICD-465.9) OSTEOARTHRITIS (ICD-715.90) TOE PAIN (ICD-729.5) DEPENDENT EDEMA, LEGS (ICD-782.3) HYPERTENSION (ICD-401.9) GERD (ICD-530.81) DEPRESSION (ICD-311)  Past Surgical History: Reviewed history from 04/29/2009 and no changes required. Tran flap for breast cancer ;  bilat knee arthroscopy ; parathyroid resection  for hypercalcemia Total knee replacement (06/18/2008)--daldorf Mastectomy (0/454098)-- R breast septoplasty-- 1980 Tonsillectomy Parathyroidectomy hand surgery---dog bite-- R hand hand surgery--trauma==L hand Tumor R big toe--  1982 rectal abscess Hysterectomy-- TAH BSO secondary to fibroids and 1998 Caesarean section (1988)  Family History: Reviewed history from 04/29/2009 and no changes required. Family History Breast cancer 1st degree relative <50 Family History ovarian cancer colon polyps father - emphysema, lung ca maternal aunt - breast and colon ca  Social History: Reviewed history from 09/22/2009 and no changes required. nonsmoker, 1 daughter, widowed occ. 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  The patient denies shortness of breath at rest, productive cough, coughing up blood, chest pain, loss of appetite, abdominal pain, difficulty swallowing, sore throat, tooth/dental problems, itching, ear ache, rash, and change in color of mucus.        See HPI for Pulmonary, Cardiac, General, and ENT review of systems.  Vital Signs:  Patient profile:   55 year old female Height:      66.5 inches Weight:      272 pounds BMI:     43.40 O2 Sat:      98 % on Room air Temp:     99.4 degrees F oral Pulse rate:   108 / minute BP sitting:   120 / 70  (left arm) Cuff size:   large  Vitals Entered By: Gweneth Dimitri RN (June 23, 2010 3:35 PM)  O2 Flow:  Room air CC: Pulmonary Consult - DOE, cough.  , asthma Comments Medications reviewed with patient Daytime contact number verified with patient. Gweneth Dimitri RN  June 23, 2010 3:36 PM    Physical Exam  Additional Exam:  Gen: Pleasant, well-nourished, in no distress,  normal affect ENT: No lesions,  mouth  clear,  oropharynx clear, no postnasal drip Neck: No JVD, no TMG, no carotid bruits Lungs: No use of accessory muscles, no dullness to percussion, exp wheeze Cardiovascular: RRR, heart sounds normal, no murmur or gallops, no peripheral edema Abdomen: soft and NT, no HSM,  BS normal Musculoskeletal: No deformities, no cyanosis or clubbing Neuro: alert, non focal Skin: Warm, no lesions or rashes    Impression & Recommendations:  Problem # 1:  COUGH VARIANT ASTHMA (ICD-493.82) Assessment Deteriorated cough variant asthma with GERD as a major ppt factor, note spirometry was normal plan Start Qvar two puff twice daily Proair as needed  Prednisone 10mg  4 each am x 4 days, 3 x 4 days, 2 x 4 days, 1 x 4 days then stop  (stop prior prescription, start over with new RX) Tussionex as needed for cough Use nexium daily 1/2 hour before meal Follow strict reflux diet  Return High POint two weeks   Medications Added to Medication List This Visit: 1)  Nexium 40 Mg Cpdr (Esomeprazole magnesium) .Marland Kitchen.. 1 by mouth once daily as needed 2)  Nexium 40 Mg Cpdr (Esomeprazole magnesium) .Marland Kitchen.. 1 by mouth once daily  1/2 before a meal and then eat 3)  Meloxicam 15 Mg Tabs (Meloxicam) .... Take 1 tablet by mouth once a day 4)  Fish Oil Oil (Fish oil) .... Hold 5)  B Complex-b12 Tabs (B complex vitamins) .... Take 1 tablet by mouth once a day 6)  Prednisone 10 Mg Tabs (Prednisone) .... Take as directed 4 each am x 4 days, 3 x 4 days, 2 x 4 days, 1 x 4 days then stop 7)  Vitamin E 400 Unit Caps (Vitamin e) .... Take 1 capsule by mouth once a day 8)  Tussionex Pennkinetic Er 8-10 Mg/31ml Lqcr (Chlorpheniramine-hydrocodone) .... 5 cc by mouth twice daily as needed 9)  Qvar 40 Mcg/act Aers (Beclomethasone dipropionate) .... Two puffs twice daily  Complete Medication List: 1)  Nexium 40 Mg Cpdr (Esomeprazole magnesium) .Marland Kitchen.. 1 by mouth once daily  1/2 before a meal and then eat 2)  Caltrate 600 1500 Mg Tabs (Calcium  carbonate) .... Take 1 tablet by mouth once a day 3)  Flonase 50 Mcg/act Susp (Fluticasone propionate) .... 2 sprays each nostril daily 4)  Proair Hfa 108 (90 Base) Mcg/act Aers (Albuterol sulfate) .Marland Kitchen.. 1-2 sprays q 4 hours as needed 5)  Meloxicam 15 Mg Tabs (Meloxicam) .... Take 1 tablet by mouth once a day 6)  Micardis Hct 80-25 Mg Tabs (Telmisartan-hctz) .Marland Kitchen.. 1 tab by mouth daily 7)  Fish Oil Oil (Fish oil) .... Hold 8)  B Complex-b12 Tabs (B complex vitamins) .... Take 1 tablet by mouth once a day 9)  Furosemide 20 Mg Tabs (Furosemide) .... Take 1 tab by mouth every day, may take 2nd pill nightly if edema 10)  Alprazolam 0.5 Mg Tabs (Alprazolam) .... As needed every 4-6 hrs for sever anxiety. 11)  Prednisone 10 Mg Tabs (Prednisone) .... Take as directed 4 each am x 4 days, 3 x 4 days, 2 x 4 days, 1 x 4 days then stop 12)  Tessalon 200 Mg Caps (Benzonatate) .... Take one capsule by mouth three times a day as needed for cough 13)  Vitamin E 400 Unit Caps (Vitamin e) .... Take 1 capsule by mouth once a day 14)  Tussionex Pennkinetic Er 8-10 Mg/78ml Lqcr (Chlorpheniramine-hydrocodone) .... 5 cc by mouth twice daily as needed 15)  Qvar 40 Mcg/act Aers (Beclomethasone dipropionate) .... Two puffs twice daily  Other Orders: New Patient Level V (25427) Spirometry w/Graph (94010)  Patient Instructions: 1)  Start Qvar two puff twice daily 2)  Proair as needed  3)  Prednisone 10mg  4 each am x 4 days, 3 x 4 days, 2 x 4 days, 1 x 4 days then stop  (stop prior prescription, start over with new RX) 4)  Tussionex as needed for cough 5)  Use nexium daily 1/2 hour before meal 6)  Follow strict reflux diet  7)  Return High POint two weeks  Prescriptions: TUSSIONEX PENNKINETIC ER 8-10 MG/5ML  LQCR (CHLORPHENIRAMINE-HYDROCODONE) 5 cc by mouth twice daily as needed  #200 ML x 0   Entered and Authorized by:   Storm Frisk MD   Signed by:   Storm Frisk MD on 06/23/2010   Method used:   Print then  Give to Patient   RxID:   [id number] PREDNISONE 10 MG  TABS (PREDNISONE) Take as directed 4 each am x 4 days, 3 x 4 days, 2 x 4 days, 1 x 4 days then stop  #40 x 0   Entered and Authorized by:   Storm Frisk MD   Signed by:   Storm Frisk MD on 06/23/2010   Method used:   [facility] to        [facility]  [employer] [facility]* (retail)       [address]       [place], [place]  [address]       Ph: [phone]       Fax: [phone]   RxID:   [id number] NEXIUM 40 MG CPDR (ESOMEPRAZOLE MAGNESIUM) 1 by mouth once daily  1/2 before a meal and then eat  #30 x 6   Entered and Authorized by:   Storm Frisk MD   Signed by:   Storm Frisk MD on 06/23/2010   Method used:   Electronically to        [facility] [phone]* (retail)       [address]       [place], [place]  [address]       Ph: [phone]       Fax: [phone]   RxID:   [id number] QVAR 40 MCG/ACT  AERS (BECLOMETHASONE DIPROPIONATE) Two puffs twice daily  #1 x 6   Entered and Authorized by:   Storm Frisk MD   Signed by:   Storm Frisk MD on 06/23/2010   Method used:   Electronically to        [facility] [phone]* (retail)       [address]       [place], [place]  [address]       Ph: [phone]       Fax: [phone]   RxID:   [id number]

## 2010-07-07 ENCOUNTER — Other Ambulatory Visit: Payer: Self-pay | Admitting: Family Medicine

## 2010-07-07 ENCOUNTER — Ambulatory Visit: Payer: Self-pay | Admitting: Critical Care Medicine

## 2010-07-07 DIAGNOSIS — Z1231 Encounter for screening mammogram for malignant neoplasm of breast: Secondary | ICD-10-CM

## 2010-07-13 ENCOUNTER — Institutional Professional Consult (permissible substitution): Payer: Self-pay | Admitting: Pulmonary Disease

## 2010-07-14 NOTE — Assessment & Plan Note (Signed)
Summary: had allergic reaction last week to meds/still feels bad/ went...   Vital Signs:  Patient profile:   55 year old female Weight:      277 pounds BMI:     44.20 O2 Sat:      98 % on Room air Pulse rate:   92 / minute BP sitting:   126 / 80  (left arm)  Vitals Entered By: Doristine Devoid CMA (June 20, 2010 2:28 PM)  O2 Flow:  Room air CC: allergic raction to mucinex dm throat close up feels worse throat irritated and feel SOB    History of Present Illness: 55 yo woman here today to f/u on recent hospital admit.  was d/c'd on 1/16 after 3 day admission for ? asthma exacerbation.  sxs started w/ 'scratchy throat' and cough.  took daughter's Mucinex and went to bed.  pt woke up and got dressed and suddenly was unable to breathe.  EMS arrived and gave albuterol tx x2 w/out results, got IV steroids w/out relief.  got epi injxn and was able to breathe w/in 3 minutes.  was told that CXR showed narrowed upper airway- upon xray review airway is normal.  still has SOB occuring w/ minimal exertion.  completed Zpack and prednisone, on 2 inhalers (flovent and ventolin) and still not feeling well.  was told in hospital she had allergic rxn to medication.  Current Medications (verified): 1)  Nexium 40 Mg Cpdr (Esomeprazole Magnesium) .Marland Kitchen.. 1 By Mouth Once Daily Prn 2)  Caltrate 600 1500 Mg Tabs (Calcium Carbonate) .... Take 1 Tablet By Mouth Once A Day 3)  Flonase 50 Mcg/act Susp (Fluticasone Propionate) .... 2 Sprays Each Nostril Daily 4)  Zyrtec Allergy 10 Mg  Tabs (Cetirizine Hcl) .Marland Kitchen.. 1 By Mouth Once Daily 5)  Proair Hfa 108 (90 Base) Mcg/act  Aers (Albuterol Sulfate) .Marland Kitchen.. 1-2 Sprays Q 4 Hours As Needed 6)  Meloxicam 7.5 Mg Tabs (Meloxicam) .... Take One Tablet Daily As Needed 7)  Zolpidem Tartrate 10 Mg  Tabs (Zolpidem Tartrate) .... 1/2 or 1 By Mouth [facility] Prn 8)  Micardis Hct 80-25 Mg Tabs (Telmisartan-Hctz) .Marland Kitchen.. 1 Tab By Mouth Daily 9)  Fish Oil  Oil (Fish Oil) .... Take Daily 10)  B  Complex-B12  Tabs (B Complex Vitamins) 11)  Furosemide 20 Mg Tabs (Furosemide) .... Take 1 Tab By Mouth Every Day, May Take 2nd Pill Nightly If Edema 12)  Klor-Con M20 20 Meq Cr-Tabs (Potassium Chloride Crys Cr) .Marland Kitchen.. 1 Tab By Mouth Daily 13)  Alprazolam 0.5 Mg Tabs (Alprazolam) .... As Needed Every 4-6 Hrs For Sever Anxiety. 14)  Zoloft 50 Mg Tabs (Sertraline Hcl) .... 1/2 Tab Daily X1 Week and Then 1 Tab By Mouth Daily 15)  Prednisone 20 Mg Tabs (Prednisone) .... 2 Tabs Daily X7 Days 16)  Tessalon 200 Mg Caps (Benzonatate) .... Take One Capsule By Mouth Three Times A Day As Needed For Cough  Allergies (verified): 1)  ! Pcn 2)  ! * Mucinex Dm  Past History:  Past medical, surgical, family and social histories (including risk factors) reviewed for relevance to current acute and chronic problems.  Past Medical History: Reviewed history from 04/29/2009 and no changes required. Depression after husband's death 2001/10/31 GERD Hypertension Osteoarthritis Allergic rhinitis Breast Cancer 1995 Current Problems:  VIRAL MENINGITIS, HX OF (ICD-V12.49) FAMILY HISTORY BREAST CANCER 1ST DEGREE RELATIVE <50 (ICD-V16.3) ADVERSE DRUG REACTION (ICD-995.20) INSOMNIA-SLEEP DISORDER-UNSPEC (ICD-780.52) VITAMIN D DEFICIENCY (ICD-268.9) GENERALIZED PAIN (ICD-780.96) THUMB PAIN, LEFT (ICD-729.5) HYPERPARATHYROIDISM,  HX OF (ICD-V12.2) ANXIETY STATE, UNSPECIFIED (ICD-300.00) ESOPHAGEAL REFLUX (ICD-530.81) DYSPNEA/SHORTNESS OF BREATH (ICD-786.09) ALLERGIC RHINITIS (ICD-477.9) URI (ICD-465.9) OSTEOARTHRITIS (ICD-715.90) TOE PAIN (ICD-729.5) DEPENDENT EDEMA, LEGS (ICD-782.3) HYPERTENSION (ICD-401.9) GERD (ICD-530.81) DEPRESSION (ICD-311)  Past Surgical History: Reviewed history from 04/29/2009 and no changes required. Tran flap for breast cancer ;  bilat knee arthroscopy ; parathyroid resection  for hypercalcemia Total knee replacement (06/18/2008)--daldorf Mastectomy (2/956213)-- R  breast septoplasty-- 1980 Tonsillectomy Parathyroidectomy hand surgery---dog bite-- R hand hand surgery--trauma==L hand Tumor R big toe-- 1982 rectal abscess Hysterectomy-- TAH BSO secondary to fibroids and 1998 Caesarean section (1988)  Family History: Reviewed history from 04/29/2009 and no changes required. Family History Breast cancer 1st degree relative <50 Family History Ovarian cancer colon polyps  Social History: Reviewed history from 09/22/2009 and no changes required. nonsmoker, daughter, widowed occ. 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 No apparent alopecia or balding.  no TTP over sinuses Eyes:  no injxn or inflammation Ears:  External ear exam shows no significant lesions or deformities.  Otoscopic examination reveals clear canals, tympanic membranes are intact bilaterally without bulging, retraction, inflammation or discharge. Hearing is grossly normal bilaterally. Nose:  External nasal examination shows no deformity or inflammation. Nasal mucosa are pink and moist without lesions or exudates. Mouth:  + PND Neck:  No deformities, masses, or tenderness noted. Lungs:  Normal respiratory effort, chest expands symmetrically. diffuse expiratory wheezes.  dry cough. Heart:  Normal rate and regular rhythm. S1 and S2 normal without gallop, murmur, click, rub or other extra sounds. Cervical Nodes:  No lymphadenopathy noted   Impression & Recommendations:  Problem # 1:  AIRWAY OBSTRUCTION (ICD-519.8) Assessment New pt's sxs sound more consistent w/ acute and severe asthma rather than allergic rxn.  it would be  strange for rxn not to occur for more than 12 hrs after use.  pt still w/ diffuse wheezes.  re-start steroid burst to open airways.  continue both controller and rescue inhaler.  refer to pulm for evaluation and management as pt's sxs required hospitalization and epi.  hospital records and CXR reviewed.  reviewed supportive care and red flags that should prompt return.  Pt expresses understanding and is in agreement w/ this plan. Orders: Pulmonary Referral (Pulmonary)  Complete Medication List: 1)  Nexium 40 Mg Cpdr (Esomeprazole magnesium) .Marland Kitchen.. 1 by mouth once daily  1/2 before a meal and then eat 2)  Caltrate 600 1500 Mg Tabs (Calcium carbonate) .... Take 1 tablet by mouth once a day 3)  Flonase 50 Mcg/act Susp (Fluticasone propionate) .... 2 sprays each nostril daily 4)  Proair Hfa 108 (90 Base) Mcg/act Aers (Albuterol sulfate) .Marland Kitchen.. 1-2 sprays q 4 hours as needed 5)  Meloxicam 15 Mg Tabs (Meloxicam) .... Take 1 tablet by mouth once a day 6)  Micardis Hct 80-25 Mg Tabs (Telmisartan-hctz) .Marland Kitchen.. 1 tab by mouth daily 7)  Fish Oil Oil (Fish oil) .... Hold 8)  B Complex-b12 Tabs (B complex vitamins) .... Take 1 tablet by mouth once a day 9)  Furosemide 20 Mg Tabs (Furosemide) .... Take 1 tab by mouth every day, may take 2nd pill nightly if edema 10)  Alprazolam 0.5 Mg Tabs (Alprazolam) .... As needed every  4-6 hrs for sever anxiety. 11)  Prednisone 10 Mg Tabs (Prednisone) .... Take as directed 4 each am x 4 days, 3 x 4 days, 2 x 4 days, 1 x 4 days then stop 12)  Tessalon 200 Mg Caps (Benzonatate) .... Take one capsule by mouth three times a day as needed for cough 13)  Vitamin E 400 Unit Caps (Vitamin e) .... Take 1 capsule by mouth once a day 14)  Tussionex Pennkinetic Er 8-10 Mg/57ml Lqcr (Chlorpheniramine-hydrocodone) .... 5 cc by mouth twice daily as needed 15)  Qvar 40 Mcg/act Aers (Beclomethasone dipropionate) .... Two puffs twice daily  Patient Instructions: 1)  We will call you with your  pulmonary appt 2)  Continue the Flovent as your controller medication and use the albuterol as needed for shortness of breath 3)  Restart the Prednisone for airway inflammation 4)  Continue the Tussionex for night cough 5)  Use the Tessalon for day cough 6)  Call with any questions or concerns 7)  Hang in there!!! Prescriptions: TESSALON 200 MG CAPS (BENZONATATE) Take one capsule by mouth three times a day as needed for cough  #60 x 0   Entered and Authorized by:   Neena Rhymes MD   Signed by:   Neena Rhymes MD on 06/20/2010   Method used:   Electronically to        [facility] [phone]* (retail)       [address]       [place], [place]  [address]       Ph: [phone]       Fax: [phone]   RxID:   [id number] PREDNISONE 20 MG TABS (PREDNISONE) 2 tabs daily x7 days  #14 x 0   Entered and Authorized by:   Neena Rhymes MD   Signed by:   Neena Rhymes MD on 06/20/2010   Method used:   Electronically to        [facility] [phone]* (retail)       [address]       [place], [place]  [address]       Ph: [phone]       Fax: [phone]   RxID:   [id number]    Orders Added: 1)  Pulmonary Referral [Pulmonary] 2)  Est. Patient Level IV [[address]]

## 2010-07-20 NOTE — Letter (Signed)
Summary: [facility]-Shortness of Breath  [facility]-Shortness of Breath   Imported By: Maryln Gottron 07/12/2010 13:01:14  _____________________________________________________________________  External Attachment:    Type:   Image     Comment:   External Document

## 2010-08-08 ENCOUNTER — Ambulatory Visit
Admission: RE | Admit: 2010-08-08 | Discharge: 2010-08-08 | Disposition: A | Discharge status: Home / Self Care | Payer: Private Health Insurance - Indemnity | Source: Ambulatory Visit | Attending: Family Medicine | Admitting: Family Medicine

## 2010-08-08 DIAGNOSIS — Z1231 Encounter for screening mammogram for malignant neoplasm of breast: Secondary | ICD-10-CM

## 2010-08-15 ENCOUNTER — Telehealth: Payer: Self-pay | Admitting: Family Medicine

## 2010-08-25 ENCOUNTER — Ambulatory Visit (HOSPITAL_BASED_OUTPATIENT_CLINIC_OR_DEPARTMENT_OTHER)
Admission: RE | Admit: 2010-08-25 | Discharge: 2010-08-25 | Disposition: A | Discharge status: Home / Self Care | Payer: Private Health Insurance - Indemnity | Source: Ambulatory Visit | Attending: Family Medicine | Admitting: Family Medicine

## 2010-08-25 ENCOUNTER — Encounter: Payer: Self-pay | Admitting: Family Medicine

## 2010-08-25 ENCOUNTER — Other Ambulatory Visit: Payer: Self-pay | Admitting: Family Medicine

## 2010-08-25 ENCOUNTER — Ambulatory Visit (INDEPENDENT_AMBULATORY_CARE_PROVIDER_SITE_OTHER): Payer: Private Health Insurance - Indemnity | Admitting: Family Medicine

## 2010-08-25 VITALS — BP 120/64 | Temp 98.9°F | Ht 66.0 in | Wt 284.5 lb

## 2010-08-25 DIAGNOSIS — M79606 Pain in leg, unspecified: Secondary | ICD-10-CM

## 2010-08-25 DIAGNOSIS — M79609 Pain in unspecified limb: Secondary | ICD-10-CM

## 2010-08-25 NOTE — Progress Notes (Signed)
  Subjective:    Patient ID: [name], female    DOB: 1955/11/06, 55 y.o.   MRN: [id number]  HPI Leg pain- R leg, first noticed a 'couple days ago'.  Pain w/ getting up from seated position and walking.  Causing pt to limp.  Pain is at 'top of calf in the very back'.  Feels 'a little lump' at site of pain.  Pain described as a dull throb.  Has been elevating legs at night.  Denies recent travel or long car rides.  No recent immobility.  Reports that leg will 'feel hot and then feels like it gets cold'.   Review of Systems For ROS see HPI    Objective:   Physical Exam  Constitutional: She appears well-developed and well-nourished.  Cardiovascular: Intact distal pulses.   Musculoskeletal:       Right knee: She exhibits swelling. She exhibits no erythema and no bony tenderness. tenderness found. No medial joint line and no lateral joint line tenderness noted.       Large area of painful swelling of posterior R leg just below knee.          Assessment & Plan:

## 2010-08-25 NOTE — Assessment & Plan Note (Signed)
Given location of pain and swelling pt likely has large baker's cyst.  Must r/o DVT.  Get LE doppler.  mobic prn for pain.  If area is baker's cyst will send pt to ortho UC for treatment so she can go to the beach tomorrow as planned.  If DVT will need hospital admit.  Pt expressed understanding and is in agreement w/ plan.

## 2010-08-25 NOTE — Progress Notes (Signed)
Summary: Refill--Furosemide  Phone Note Refill Request Message from:  Pharmacy on August 15, 2010 11:27 AM  Refills Requested: Medication #1:  FUROSEMIDE 20 MG TABS Take 1 tab by mouth every day Ivar Bury - fax [phone]     Prescriptions: FUROSEMIDE 20 MG TABS (FUROSEMIDE) Take 1 tab by mouth every day, may take 2nd pill nightly if edema  #60 x 3   Entered by:   Lucious Groves CMA   Authorized by:   Neena Rhymes MD   Signed by:   Lucious Groves CMA on 08/15/2010   Method used:   Electronically to        [facility] [phone]* (retail)       [address]       [place], [place]  [address]       Ph: [phone]       Fax: [phone]   RxID:   [id number]

## 2010-08-25 NOTE — Patient Instructions (Signed)
Schedule a complete physical at your convenience- do not eat before this appt We'll notify you of your [name] results and determine the next step You can take the Meloxicam daily Hang in there!

## 2010-09-12 LAB — CBC
HCT: 27 % — ABNORMAL LOW (ref 36.0–46.0)
HCT: 29.4 % — ABNORMAL LOW (ref 36.0–46.0)
HCT: 32.3 % — ABNORMAL LOW (ref 36.0–46.0)
HCT: 39.1 % (ref 36.0–46.0)
Hemoglobin: 10.6 g/dL — ABNORMAL LOW (ref 12.0–15.0)
Hemoglobin: 12.7 g/dL (ref 12.0–15.0)
Hemoglobin: 9 g/dL — ABNORMAL LOW (ref 12.0–15.0)
Hemoglobin: 9.9 g/dL — ABNORMAL LOW (ref 12.0–15.0)
MCHC: 32.5 g/dL (ref 30.0–36.0)
MCHC: 32.7 g/dL (ref 30.0–36.0)
MCHC: 33.3 g/dL (ref 30.0–36.0)
MCHC: 33.8 g/dL (ref 30.0–36.0)
MCV: 85.3 fL (ref 78.0–100.0)
MCV: 86.4 fL (ref 78.0–100.0)
MCV: 86.8 fL (ref 78.0–100.0)
MCV: 86.9 fL (ref 78.0–100.0)
Platelets: 174 10*3/uL (ref 150–400)
Platelets: 209 10*3/uL (ref 150–400)
Platelets: 210 10*3/uL (ref 150–400)
Platelets: 249 10*3/uL (ref 150–400)
RBC: 3.11 MIL/uL — ABNORMAL LOW (ref 3.87–5.11)
RBC: 3.45 MIL/uL — ABNORMAL LOW (ref 3.87–5.11)
RBC: 3.71 MIL/uL — ABNORMAL LOW (ref 3.87–5.11)
RBC: 4.52 MIL/uL (ref 3.87–5.11)
RDW: 14.4 % (ref 11.5–15.5)
RDW: 15.2 % (ref 11.5–15.5)
RDW: 15.2 % (ref 11.5–15.5)
RDW: 15.2 % (ref 11.5–15.5)
WBC: 6.9 10*3/uL (ref 4.0–10.5)
WBC: 7.6 10*3/uL (ref 4.0–10.5)
WBC: 8.3 10*3/uL (ref 4.0–10.5)
WBC: 9.6 10*3/uL (ref 4.0–10.5)

## 2010-09-12 LAB — BASIC METABOLIC PANEL
BUN: 12 mg/dL (ref 6–23)
BUN: 6 mg/dL (ref 6–23)
BUN: 7 mg/dL (ref 6–23)
BUN: 9 mg/dL (ref 6–23)
CO2: 28 mEq/L (ref 19–32)
CO2: 28 mEq/L (ref 19–32)
CO2: 30 mEq/L (ref 19–32)
CO2: 30 mEq/L (ref 19–32)
Calcium: 8 mg/dL — ABNORMAL LOW (ref 8.4–10.5)
Calcium: 8.2 mg/dL — ABNORMAL LOW (ref 8.4–10.5)
Calcium: 8.6 mg/dL (ref 8.4–10.5)
Calcium: 9.6 mg/dL (ref 8.4–10.5)
Chloride: 101 mEq/L (ref 96–112)
Chloride: 103 mEq/L (ref 96–112)
Chloride: 95 mEq/L — ABNORMAL LOW (ref 96–112)
Chloride: 97 mEq/L (ref 96–112)
Creatinine, Ser: 0.7 mg/dL (ref 0.4–1.2)
Creatinine, Ser: 0.79 mg/dL (ref 0.4–1.2)
Creatinine, Ser: 0.82 mg/dL (ref 0.4–1.2)
Creatinine, Ser: 0.91 mg/dL (ref 0.4–1.2)
GFR calc Af Amer: >60 mL/min (ref >60)
GFR calc Af Amer: >60 mL/min (ref >60)
GFR calc Af Amer: >60 mL/min (ref >60)
GFR calc Af Amer: >60 mL/min (ref >60)
GFR calc non Af Amer: >60 mL/min (ref >60)
GFR calc non Af Amer: >60 mL/min (ref >60)
GFR calc non Af Amer: >60 mL/min (ref >60)
GFR calc non Af Amer: >60 mL/min (ref >60)
Glucose, Bld: 112 mg/dL — ABNORMAL HIGH (ref 70–99)
Glucose, Bld: 118 mg/dL — ABNORMAL HIGH (ref 70–99)
Glucose, Bld: 124 mg/dL — ABNORMAL HIGH (ref 70–99)
Glucose, Bld: 140 mg/dL — ABNORMAL HIGH (ref 70–99)
Potassium: 3.6 mEq/L (ref 3.5–5.1)
Potassium: 3.6 mEq/L (ref 3.5–5.1)
Potassium: 4 mEq/L (ref 3.5–5.1)
Potassium: 4.3 mEq/L (ref 3.5–5.1)
Sodium: 130 mEq/L — ABNORMAL LOW (ref 135–145)
Sodium: 134 mEq/L — ABNORMAL LOW (ref 135–145)
Sodium: 137 mEq/L (ref 135–145)
Sodium: 140 mEq/L (ref 135–145)

## 2010-09-12 LAB — PROTIME-INR
INR: 1.1 (ref 0.00–1.49)
INR: 1.1 (ref 0.00–1.49)
INR: 1.6 — ABNORMAL HIGH (ref 0.00–1.49)
INR: 2 — ABNORMAL HIGH (ref 0.00–1.49)
Prothrombin Time: 14.5 seconds (ref 11.6–15.2)
Prothrombin Time: 14.6 seconds (ref 11.6–15.2)
Prothrombin Time: 19.9 seconds — ABNORMAL HIGH (ref 11.6–15.2)
Prothrombin Time: 23.4 seconds — ABNORMAL HIGH (ref 11.6–15.2)

## 2010-09-14 ENCOUNTER — Other Ambulatory Visit (HOSPITAL_COMMUNITY): Payer: Self-pay | Admitting: Obstetrics and Gynecology

## 2010-09-21 ENCOUNTER — Ambulatory Visit (INDEPENDENT_AMBULATORY_CARE_PROVIDER_SITE_OTHER): Payer: Private Health Insurance - Indemnity | Admitting: Family Medicine

## 2010-09-21 ENCOUNTER — Encounter: Payer: Self-pay | Admitting: Family Medicine

## 2010-09-21 VITALS — BP 120/78 | Temp 98.6°F | Wt 282.1 lb

## 2010-09-21 DIAGNOSIS — I491 Atrial premature depolarization: Secondary | ICD-10-CM

## 2010-09-21 DIAGNOSIS — E559 Vitamin D deficiency, unspecified: Secondary | ICD-10-CM

## 2010-09-21 DIAGNOSIS — F329 Major depressive disorder, single episode, unspecified: Secondary | ICD-10-CM

## 2010-09-21 DIAGNOSIS — I10 Essential (primary) hypertension: Secondary | ICD-10-CM

## 2010-09-21 DIAGNOSIS — R002 Palpitations: Secondary | ICD-10-CM

## 2010-09-21 DIAGNOSIS — F3289 Other specified depressive episodes: Secondary | ICD-10-CM

## 2010-09-21 DIAGNOSIS — E669 Obesity, unspecified: Secondary | ICD-10-CM

## 2010-09-21 DIAGNOSIS — R52 Pain, unspecified: Secondary | ICD-10-CM

## 2010-09-21 HISTORY — DX: Palpitations: R00.2

## 2010-09-21 HISTORY — DX: Atrial premature depolarization: I49.1

## 2010-09-21 HISTORY — DX: Morbid (severe) obesity due to excess calories: E66.01

## 2010-09-21 LAB — CBC WITH DIFFERENTIAL/PLATELET
Basophils Absolute: 0.1 10*3/uL (ref 0.0–0.1)
Basophils Relative: 1.1 % (ref 0.0–3.0)
Eosinophils Absolute: 0.1 10*3/uL (ref 0.0–0.7)
Eosinophils Relative: 2.1 % (ref 0.0–5.0)
HCT: 35.4 % — ABNORMAL LOW (ref 36.0–46.0)
Hemoglobin: 11.7 g/dL — ABNORMAL LOW (ref 12.0–15.0)
Lymphocytes Relative: 40.7 % (ref 12.0–46.0)
Lymphs Abs: 2.4 10*3/uL (ref 0.7–4.0)
MCHC: 33.1 g/dL (ref 30.0–36.0)
MCV: 84.8 fl (ref 78.0–100.0)
Monocytes Absolute: 0.4 10*3/uL (ref 0.1–1.0)
Monocytes Relative: 7.4 % (ref 3.0–12.0)
Neutro Abs: 2.8 10*3/uL (ref 1.4–7.7)
Neutrophils Relative %: 48.7 % (ref 43.0–77.0)
Platelets: 217 10*3/uL (ref 150.0–400.0)
RBC: 4.17 Mil/uL (ref 3.87–5.11)
RDW: 15.4 % — ABNORMAL HIGH (ref 11.5–14.6)
WBC: 5.8 10*3/uL (ref 4.5–10.5)

## 2010-09-21 LAB — HEPATIC FUNCTION PANEL
ALT: 20 U/L (ref 0–35)
AST: 20 U/L (ref 0–37)
Albumin: 3.6 g/dL (ref 3.5–5.2)
Alkaline Phosphatase: 68 U/L (ref 39–117)
Bilirubin, Direct: 0.1 mg/dL (ref 0.0–0.3)
Total Bilirubin: 0.5 mg/dL (ref 0.3–1.2)
Total Protein: 6.5 g/dL (ref 6.0–8.3)

## 2010-09-21 LAB — BASIC METABOLIC PANEL
BUN: 12 mg/dL (ref 6–23)
CO2: 28 mEq/L (ref 19–32)
Calcium: 9.2 mg/dL (ref 8.4–10.5)
Chloride: 106 mEq/L (ref 96–112)
Creatinine, Ser: 0.7 mg/dL (ref 0.4–1.2)
GFR: 96.71 mL/min (ref >60.00)
Glucose, Bld: 90 mg/dL (ref 70–99)
Potassium: 4.1 mEq/L (ref 3.5–5.1)
Sodium: 142 mEq/L (ref 135–145)

## 2010-09-21 LAB — TSH: TSH: 0.73 u[IU]/mL (ref 0.35–5.50)

## 2010-09-21 LAB — LIPID PANEL
Cholesterol: 188 mg/dL (ref 0–200)
HDL: 51.5 mg/dL (ref >39.00)
LDL Cholesterol: 114 mg/dL — ABNORMAL HIGH (ref 0–99)
Total CHOL/HDL Ratio: 4
Triglycerides: 114 mg/dL (ref 0.0–149.0)
VLDL: 22.8 mg/dL (ref 0.0–40.0)

## 2010-09-21 MED ORDER — DULOXETINE HCL 30 MG PO CPEP: 30.0000 mg | ORAL_CAPSULE | Freq: Every day | ORAL | Status: DC

## 2010-09-21 NOTE — Patient Instructions (Signed)
Follow up in 1 month to recheck mood Start the Cymbalta daily We'll notify you of your lab results Keep up the good work on healthy food choices and try and get regular exercise Call with any questions or concerns Hang in there!

## 2010-09-21 NOTE — Assessment & Plan Note (Signed)
Given depressed mood and + TTP over multiple trigger points I now suspect fibromyalgia.  Will start cymbalta and see if pain and mood improve.

## 2010-09-21 NOTE — Progress Notes (Signed)
  Subjective:    Patient ID: [name], female    DOB: 02/23/1956, 55 y.o.   MRN: [id number]  HPI CPE- GYN, Renaldo Fiddler.  UTD on pap, mammo.  Colonoscopy ~5 yrs ago.  HTN- excellent control today.  On Micardis HCT.  No CP, SOB.  Palpitations- 2 episodes of heart racing in last 2 weeks.  One occurrence woke pt from sleep.  Denies CP but 'heart was pounding'.  Episode lasted ~15 minutes.  Had normal ECHO while hospitalized in December.  Fatigue- 'i can't get over it.  i don't even feel good anymore.  My arms and legs just feel so heavy'.  Is off all depression meds currently.  'all i want to do is sleep'.  Has previously been on Wellbutrin, Zoloft, Celexa, Effexor.  'i don't want to go out anymore or do anything'.  Gaining weight.   Review of Systems Patient reports no vision/ hearing changes, adenopathy,fever, weight change,  persistant/recurrent hoarseness , swallowing issues, chest pain, palpitations, edema, persistant/recurrent cough, hemoptysis, dyspnea (rest/exertional/paroxysmal nocturnal), gastrointestinal bleeding (melena, rectal bleeding), abdominal pain, significant heartburn, bowel changes, GU symptoms (dysuria, hematuria, incontinence), Gyn symptoms (abnormal  bleeding, pain),  syncope, focal weakness, memory loss, numbness & tingling, skin/hair/nail changes, abnormal bruising or bleeding.  + anxiety/depression     Objective:   Physical Exam BP 120/78  Temp(Src) 98.6 F (37 C) (Oral)  Wt 282 lb 2 oz (127.971 kg)  General Appearance:    Alert, cooperative, no distress, appears stated age  Head:    Normocephalic, without obvious abnormality, atraumatic  Eyes:    PERRL, conjunctiva/corneas clear, EOM's intact, fundi    benign, both eyes  Ears:    Normal TM's and external ear canals, both ears  Nose:   Nares normal, septum midline, mucosa normal, no drainage    or sinus tenderness  Throat:   Lips, mucosa, and tongue normal; teeth and gums normal  Neck:   Supple, symmetrical,  trachea midline, no adenopathy;    thyroid:  no enlargement/tenderness/nodules  Back:     Symmetric, no curvature, ROM normal, no CVA tenderness  Lungs:     Clear to auscultation bilaterally, respirations unlabored  Chest Wall:    No tenderness or deformity   Heart:    Regular rate and rhythm, S1 and S2 normal, no murmur, rub   or gallop  Breast Exam:    Deferred to GYN  Abdomen:     Soft, non-tender, bowel sounds active all four quadrants,    no masses, no organomegaly  Genitalia:    Deferred to GYN  Rectal:    Deferred to GYN  Extremities:   Extremities normal, atraumatic, no cyanosis or edema, + TTP over multiple trigger points on arms, legs, back  Pulses:   2+ and symmetric all extremities  Skin:   Skin color, texture, turgor normal, no rashes or lesions  Lymph nodes:   Cervical, supraclavicular, and axillary nodes normal  Neurologic:   CNII-XII intact, normal strength, sensation and reflexes    throughout          Assessment & Plan:

## 2010-09-21 NOTE — Assessment & Plan Note (Signed)
Excellent control.  Asymptomatic.

## 2010-09-21 NOTE — Assessment & Plan Note (Signed)
2 episodes of palpitations in short time frame is somewhat concerning for arrhythmia.  Given presence of PACs on EKG will refer to cards.  Pt expressed understanding and is in agreement w/ plan.

## 2010-09-21 NOTE — Assessment & Plan Note (Signed)
Pt's depression is poorly controlled.  Given TTP over multiple trigger points, now have suspicion for fibromyalgia.  Will start low dose Cymbalta to tx both depression and pain and follow closely.

## 2010-09-21 NOTE — Assessment & Plan Note (Signed)
Check labs 

## 2010-09-21 NOTE — Assessment & Plan Note (Signed)
These may be cause of pt's symptomatic palpitations.  Refer to cards for complete w/u.

## 2010-09-23 ENCOUNTER — Encounter: Payer: Self-pay | Admitting: *Deleted

## 2010-09-23 LAB — VITAMIN D 1,25 DIHYDROXY
Vitamin D 1, 25 (OH)2 Total: 33 pg/mL (ref 18–72)
Vitamin D2 1, 25 (OH)2: <8 pg/mL
Vitamin D3 1, 25 (OH)2: 33 pg/mL

## 2010-09-26 ENCOUNTER — Encounter: Payer: Self-pay | Admitting: *Deleted

## 2010-10-03 ENCOUNTER — Encounter: Payer: Self-pay | Admitting: *Deleted

## 2010-10-03 ENCOUNTER — Encounter: Payer: Self-pay | Admitting: Cardiology

## 2010-10-04 ENCOUNTER — Ambulatory Visit (INDEPENDENT_AMBULATORY_CARE_PROVIDER_SITE_OTHER): Payer: Private Health Insurance - Indemnity | Admitting: Cardiology

## 2010-10-04 ENCOUNTER — Encounter: Payer: Self-pay | Admitting: Cardiology

## 2010-10-04 VITALS — BP 122/64 | HR 85 | Resp 18 | Ht 66.0 in | Wt 278.0 lb

## 2010-10-04 DIAGNOSIS — R002 Palpitations: Secondary | ICD-10-CM

## 2010-10-04 NOTE — Patient Instructions (Signed)
Schedule an appointment for a 48 hour monitor.  You do not need to schedule a follow-up appt with Dr Shirlee Latch.

## 2010-10-06 NOTE — Progress Notes (Signed)
PCP: Dr. Beverely Low  55 yo with history of HTN presents for evaluation of palpitations.  Patient has had 2 episodes about 3 weeks ago where she woke up at night with her heart racing.  This lasted about 5 minutes each time.  It has not recurred.  At the time, she was not using any over the counter cold medications and had not increased her caffeine intake.  She drinks no more than 1 caffeinated beverage a day.  She did not feel lightheaded or get chest pain.  She has occasional heart "flutters" that last for seconds during the day.  This has been chronic.  PCP has noted PACs.  No syncope or presyncope.  No chest pain.  She does get short of breath going up a flight of steps.    Patient was admitted in 1/12 at [facility] with cough and shortness of breath that actually sounds like stridor/upper airways obstruction.  She received epinephrine.  CTA chest showed no PE.  Echo was normal.  No recurrence of these symptoms.    ECG: NSR, normal  PMH: 1. HTN 2. Fatigue 3. Obesity 4. Asthma 5. Depression 6. Breast cancer: right mastectomy 1995 7. Right TKR 2010 8. Hysterectomy 9. Echo (1/12): EF 60-65%, normal diastolic function, no valvular abnormalities.   SH: Works for [employer], lives in [place].  Nonsmoker.   FH: Grandfather with MI at 65.  Cousin with MI at 28.  ROS: All systems reviewed and negative except as per HPI.   Current Outpatient Prescriptions  Medication Sig Dispense Refill  . albuterol (PROAIR HFA) 108 (90 BASE) MCG/ACT inhaler Inhale 2 puffs into the lungs as directed. 1-2 sprays q 4 hours prn       . ALPRAZolam (XANAX) 0.5 MG tablet Take 0.5 mg by mouth as directed. 1 tab every 4-6 hours prn severe anxiety       . beclomethasone (QVAR) 40 MCG/ACT inhaler Inhale 2 puffs into the lungs as needed.       . benzonatate (TESSALON) 200 MG capsule Take 200 mg by mouth 3 (three) times daily as needed.        . Calcium Carbonate (CALTRATE 600) 1500 MG TABS Take by mouth daily.         . chlorpheniramine-HYDROcodone (TUSSIONEX PENNKINETIC ER) 10-8 MG/5ML LQCR Take 5 mLs by mouth 2 (two) times daily as needed.        . DULoxetine (CYMBALTA) 30 MG capsule Take 1 capsule (30 mg total) by mouth daily.  30 capsule  2  . esomeprazole (NEXIUM) 40 MG capsule Take 40 mg by mouth daily.        . fluticasone (FLONASE) 50 MCG/ACT nasal spray 2 sprays by Nasal route daily.        . furosemide (LASIX) 20 MG tablet Take 20 mg by mouth as directed. 1 tab po daily, may take 2nd pill nightly for edema if needed       . meloxicam (MOBIC) 15 MG tablet Take 15 mg by mouth daily.        . Multiple Vitamin (MULTIVITAMIN) tablet Take 1 tablet by mouth daily.        Marland Kitchen telmisartan-hydrochlorothiazide (MICARDIS HCT) 80-25 MG per tablet Take 1 tablet by mouth daily.          BP 122/64  Pulse 85  Resp 18  Ht 5\' 6"  (1.676 m)  Wt 278 lb (126.1 kg)  BMI 44.87 kg/m2 General: NAD, obese Neck: No JVD, no thyromegaly  or thyroid nodule.  Lungs: Clear to auscultation bilaterally with normal respiratory effort. CV: Nondisplaced PMI.  Heart regular S1/S2, no S3/S4, no murmur.  1+ ankle edema.  No carotid bruit.  Normal pedal pulses.  Abdomen: Soft, nontender, no hepatosplenomegaly, no distention.  Skin: Intact without lesions or rashes.  Neurologic: Alert and oriented x 3.  Psych: Normal affect. Extremities: No clubbing or cyanosis.  HEENT: Normal.

## 2010-10-06 NOTE — Assessment & Plan Note (Addendum)
Patient had 2 episodes of racing heart rate that could have been SVT.  These have not recurred.  She drinks minimal caffeine.  She has known PACs.  She had an echo showing structurally normal heart in 1/12.  I will get a 48 hour holter monitor to look for any significant arrhythmias.  If this is unremarkable and she has additional episodes where her heart races, we could consider a 3 week monitor +/- use of beta blocker.

## 2010-10-11 NOTE — Assessment & Plan Note (Signed)
[facility]                        [name]   [name], [name]                      MRN:          [id number]  DATE:10/18/2006                            DOB:          1955/06/01    [name] was seen Oct 18, 2006 with persistent cough and new onset of  edema.   She denies fever, chills, sweats or other constitutional symptoms.  She  has had no headaches or halitosis.   She does have some nasal congestion and post nasal drainage associated  with some hoarseness.  She does not have dental pain or earaches.   Her cough is essentially nonproductive.   She is a nonsmoker.  She has a past history of seasonal asthma.  Her  father had lung cancer.   She has completed a 10 day course of cefuroxime and she was also given  albuterol and Asmanex.   The edema has involved the left lower extremity with significant  swelling to the knee ,but this had resolved overnight.  She has had  history of surgery on the left knee.   Her weight is essentially stable at 270.2, temperature is 98.1, pulse 88  and regular, respiratory rate 16, blood pressure 120/82.  There is full extraocular motion and no evidence of conjunctivitis.  There is slight septal deviation, but the nares are patent.  The  oropharynx was unremarkable.  Tympanic membranes are normal.  She has no lymphadenopathy about the head, neck or axilla.  There is slight bronchovesicular breath sounds without significant  wheezing.  She has a grade one-half systolic murmur versus S4 with slurring.  She has no neck vein distension or hepatojugular reflux.  There is significant crepitus of the left knee without obvious effusion.  Homans sign is negative.  Dorsalis pedis pulses are slightly decreased,  but there are no ischemic changes.  There is no significant edema at  present on the right, but 1/2+ on the left.   A chest x-ray reveals evidence of the prior right mastectomy and  reconstruction.  There is no active pulmonary disease seen.   A Z-Pak and an abbreviated course of prednisone will be prescribed for  the reactive airway disease and bronchitic symptoms.  This  is  manifested mainly as a raspy cough.  Phenergan with codeine will be  prescribed as well.   She is on Micardis 80 HCT,not on an ACE-I.   BUN, creatinine and potassium will be checked.  Sodium restriction will  be encouraged.  The edema of the left lower extremity may be related to  the arthritic component with the possible popliteal cyst.  No increase  will be made in nabumetone; she can supplement this with arthritis  strength Tylenol. The abbreviated steroid therapy may help address the  acute knee issues.     [name]. Alwyn Ren, MD,FACP,FCCP  Electronically Signed    [facility]/MedQ  DD: 10/18/2006  DT: 10/18/2006  Job #: [id number]

## 2010-10-11 NOTE — Discharge Summary (Signed)
NAMEFAYELYNN, [name] NO.:  [id number]   MEDICAL RECORD NO.:  [id number]          PATIENT TYPE:  INP   LOCATION:  5005                         FACILITY:  [facility]   PHYSICIAN:  Lubertha Basque. Dalldorf, M.D.DATE OF BIRTH:  1956-02-09   DATE OF ADMISSION:  06/18/2008  DATE OF DISCHARGE:  06/21/2008                               DISCHARGE SUMMARY   ADMITTING DIAGNOSES:  1. Left knee end-stage degenerative joint disease.  2. Hypertension.  3. Depression.  4. Obesity.   DISCHARGE DIAGNOSES:  1. Left knee end-stage degenerative joint disease.  2. Hypertension.  3. Depression.  4. Obesity.   OPERATION:  Left total knee replacement.   BRIEF HISTORY:  [name] is a 55 year old white female patient well  known to our practice who is complaining of increasing left knee pain.  Pain with every step, nighttime pain, difficulty sleeping.  She has  failed nonsteroidal anti-inflammatory drugs and corticosteroid  injections.  She has also had two knee arthroscopies in years past.  Her  x-rays now shows severe end-stage DJD of her left knee.   PERTINENT LABORATORY AND X-RAY FINDINGS:  Hemoglobin from 12.7 to 9.9.,  hematocrit 29.4, WBCs 9.6.  Sodium 130, potassium 3.6, BUN 7, creatinine  0.79, glucose 118.  Serial INRs were done, low-dose Coumadin protocol.  Last INR 1.6.   COURSE IN THE HOSPITAL:  The patient was admitted postoperatively and  placed on a variety of p.o. and IM analgesics for pain, IV Ancef a gram  q.8 h. x3 doses used, also a PCA Dilaudid pump along with additional  medications for nausea, ferrous sulfate, stool softeners.  We  incorporated the use of a Foley catheter, CPM machine 0-50 initially,  low-dose Coumadin protocol and Lovenox protocol per pharmacy for DVT  prophylaxis and incentive spirometer, knee-high TEDs and then her home  medicines which will be outlined at the end of this dictation. On first  day postop, she had significant problems with nausea  and vomiting and  appropriate antiemetics were given.  Her blood pressure was 83/53, heart  rate of 113, temperature 97.8 and PO2 of 100 on room air.  Her vital  signs remained stable throughout her hospital stay.  Foley catheter was  discontinued the first day postop.  Physical therapy ordered for out of  bed weightbearing as tolerated.  The second day postop, her drain was  pulled, her dressing was changed and wound was noted to be benign.  No  sign of infection or irritation.  On the third day postop, she was  progressing well and had home therapy and blood draws arranged and was  discharged home.   CONDITION ON DISCHARGE:  Improved.   FOLLOW UP:  She was given a prescription for Percocet 1 or 2 every 4-6  hours as needed for pain, Coumadin dose regulated by pharmacy for an INR  between 2-3 and Tylenol as needed for temperature elevation.   HOME MEDICINES:  1. Claritin 10 mg one a day.  2. Benicar 40 mg one a day.  3. Hydrochlorothiazide 25 mg one a day.  4. Laxative of  choice p.r.n.  5. Os-Cal daily.  6. Wellbutrin 300 mg p.o. daily.   Return to our office in 7-10 days.  Regular low-sodium heart-healthy  diet.  Weightbearing as tolerated.  Home physical therapy and INR blood  draws arranged.  Any sign of infection, increasing redness, drainage,  increasing pain to call our office [phone].      [name], P.A.      Lubertha Basque Jerl Santos, M.D.  Electronically Signed    [facility]/MEDQ  D:  06/21/2008  T:  06/22/2008  Job:  [id number]

## 2010-10-11 NOTE — Op Note (Signed)
[name], [name]               ACCOUNT NO.:  [id number]   MEDICAL RECORD NO.:  [id number]          PATIENT TYPE:  AMB   LOCATION:  DSC                          FACILITY:  [facility]   PHYSICIAN:  Katy Fitch. Sypher, M.D. 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   POSTOPERATIVE DIAGNOSES:  Entrapped neuropathy median nerve with the  left carpal tunnel.   OPERATION:  Release of left transcarpal ligament.   OPERATIONS:  Katy Fitch. Sypher, MD   ASSISTANT:  Annye Rusk PA-C   ANESTHESIA:  General by LMA.   SUPERVISING ANESTHESIOLOGIST:  Quita Skye. Krista Blue, MD   INDICATIONS:  [name] is a 55 year old woman referred for  evaluation and management of left hand numbness.   Her clinical examination revealed signs of entrapped neuropathy.  Electrodiagnostic studies confirmed left carpal tunnel syndrome.   She has a history of a right mastectomy performed a Dr. Ronnette Juniper in the  mid 1990s.  She had a lymph node dissection but was unaware of whether  she had a sentinel node or a complete lymph node dissection.   Ms. Warnke had concerns regarding IV access in the right arm.  After  detailed discussion with her, it was decided to place a right external  jugular IV.  After informed consent, she was brought to the operating  room at this time.   PROCEDURE:  Shakti Osso was brought to the operating room and placed  in supine position on the operating table.   Following induction of general anesthesia by LMA technique, the left arm  was prepped with Betadine soap solution and sterilely draped.  A  pneumatic tourniquet spot proximal left brachium.  On exsanguination of  the left arm with Esmarch bandage, the arterial tourniquet was inflated  to 220 mmHg.  The procedure commenced with short incision in the line  of  the ring finger pulp.  The subcutaneous tissues were carefully divided  via the palmar fascia.  This was split longitudinally to reveal the  common sensory branch of the median nerve.  The nerve was gently  isolated from the transcarpal ligament with the [facility] 4 elevator  followed by release of the ligament subcutaneously extending into the  distal forearm.   In mid procedure, Ms. Arenas had a coughing spell with her LMA and had a  slight surge of blood pressure.   The tourniquet was elevated to 250 mmHg transiently.   After completion of the ligament release, bleeding points were  electrocauterized with bipolar current followed by repair of the skin  with intradermal 3-0 Prolene suture.   A compressive dressing was applied with a volar plaster splint  maintaining the wrist in 5 degrees of dorsiflexion.   For aftercare, Ms. Massimino is provided prescription for Percocet 5 mg one  p.o. q.4-6 h. p.r.n. pain 20 tablets without refill.      Katy Fitch Sypher, M.D.  Electronically Signed  [facility]/MEDQ  D:  10/22/2007  T:  10/22/2007  Job:  [id number]   cc:   Peyton Najjar, MD

## 2010-10-11 NOTE — Op Note (Signed)
NAMEEMALEY, APPLIN NO.:  [id number]   MEDICAL RECORD NO.:  [id number]          PATIENT TYPE:  INP   LOCATION:  5005                         FACILITY:  [facility]   PHYSICIAN:  Lubertha Basque. Dalldorf, M.D.DATE OF BIRTH:  [birth year]   DATE OF PROCEDURE:  06/18/2008  DATE OF DISCHARGE:                               OPERATIVE REPORT   PREOPERATIVE DIAGNOSIS:  Left knee degenerative joint disease.   POSTOPERATIVE DIAGNOSIS:  Left knee degenerative joint disease.   PROCEDURE:  Left total knee replacement.   ANESTHESIA:  General and block.   ATTENDING SURGEON:  Lubertha Basque. Jerl Santos, MD   ASSISTANT:  Lindwood Qua,   INDICATIONS FOR PROCEDURE:  The patient is a 55 year old woman with a  long history of bilateral knee pain.  She has persisted with pain  despite oral antiinflammatories and injections.  On the left side, she  has had 2 arthroscopies in past years.  She has advanced degenerative  change by x-ray and by arthroscopy.  At this point, has pain, which  limits her ability to rest and walk.  She is offered a knee replacement.  Informed operative consent was obtained after discussion of possible  complications including reaction to anesthesia, infection, DVT, PE, and  death.  The importance of the postoperative rehabilitation protocol to  optimize result was stressed with the patient.   SUMMARY/FINDINGS AND PROCEDURE:  Under general anesthesia and a block  through a standard longitudinal anterior incision, the left knee  replacement was performed.  She had advanced degenerative change in all  three compartments but excellent bone quality.  We addressed her problem  with a cemented DePuy LCS system using large femur, 10-mm deep dish  insert, 38 polyethylene patella, and a 5 MBT revision tray on the tibia  with the short stem to address her stature.  Lindwood Qua assisted  throughout and was invaluable to the completion of the case in that he  helped to position  and retract while I performed the procedure.  He also  closed simultaneously to help minimize OR time.  We did include Zinacef  antibiotic in the cement and she easily flexed to 110 against gravity at  the end of the case.   DESCRIPTION OF PROCEDURE:  The patient was in the operating suite where  general anesthetic applied without difficulty.  She was also given a  block in the preanesthesia area.  She was positioned supine and prepped,  draped in normal sterile fashion.  After the administration of IV  Kefzol, the left leg was elevated, exsanguinated, and tourniquet  inflated about thigh.  A longitudinal incision was made over the patella  with dissection down to the extensor mechanism.  All appropriate anti-  infected measures were used including closed hooded exhaust systems for  each member of the surgical team, Betadine impregnated drape, and the  preoperative IV antibiotic.  A medial parapatellar incision was made  through the extensor mechanism.  The kneecap was flipped and the knee  flexed.  Some residual meniscal tissues were removed along with the ACL  and PCL.  An intramedullary guide  was placed in the tibia to make a cut  roughly flat.  Another intramedullary guide was then placed into the  femur to make anterior and posterior cuts creating a flexion gap of 10  mm.  A second intramedullary guide was placed in the femur to make a  distal cut creating an equal extension gap of 10 mm balancing the knee.  We elected to set her up fairly loose, but she did have a 5 or 10  degrees flexion contracture preoperatively.  The femur sized to a large  in the tibia to a 5 and appropriate guides were placed and utilized.  We  did ream for the stem on the tibia which we elected to place due to her  stature.  The patella was cut down in thickness by 12 mm to a size 15  and sized to 38 with the appropriate guide placed and utilized.  A trial  reduction was done with these components and the 10  spacer.  She easily  hyperextended a bit and then flexed well and patella tracked well.  The  trial components were removed followed by pulsatile lavage irrigation of  all three cut bony surfaces.  We did include Zinacef in the cement which  was mixed and pressurized onto the bones followed by placement of the  aforementioned DePuy LCS components.  Excess cement was trimmed and  pressure was held on components until the cement had hardened.  The  tourniquet was deflated and a small amount of bleeding was easily  controlled with Bovie cautery.  The wound again was irrigated, followed  by placement of a drain exiting superolaterally.  The extensor mechanism  was reapproximated with #1 Vicryl interrupted fashion followed by  subcutaneous reapproximation with 0 and 2-0 undyed Vicryl and skin  closure with staples.  Adaptic was applied followed by dry gauze and  tape.  Estimated blood loss and intraoperative fluids can be obtained  from anesthesia records as can accurate tourniquet time.   DISPOSITION:  The patient was extubated in the operating room and taken  to recovery room in stable condition.  She was to be admitted for  Orthopedic Surgery Service for appropriate postop care to include  perioperative antibiotics and Coumadin plus Lovenox for DVT prophylaxis.      Lubertha Basque Jerl Santos, M.D.  Electronically Signed     PGD/MEDQ  D:  06/18/2008  T:  06/19/2008  Job:  [id number]

## 2010-10-13 ENCOUNTER — Encounter (INDEPENDENT_AMBULATORY_CARE_PROVIDER_SITE_OTHER): Payer: Private Health Insurance - Indemnity

## 2010-10-13 DIAGNOSIS — R002 Palpitations: Secondary | ICD-10-CM

## 2010-10-14 NOTE — Op Note (Signed)
[place]  Patient:    [name], [name] Visit Number: [id number] MRN: [id number]          Service Type: DSU Location: [phone] Attending Physician:  Kandis Mannan Dictated by:   Donnie Coffin Samuella Cota, M.D. Proc. Date: 06/19/01 Admit Date:  06/18/2001 Discharge Date: 06/19/2001   CC:         Rosalyn Gess. Norins, M.D. [facility]  Lafonda Mosses B. Thomasena Edis, M.D.  Genene Churn. Cyndie Chime, M.D.   Operative Report  CCS# [id number]  PREOPERATIVE DIAGNOSIS:  Hypercalcemia with probable right inferior parathyroid adenoma.  POSTOPERATIVE DIAGNOSIS:  Hypercalcemia with probable right inferior parathyroid adenoma.  OPERATION PERFORMED:  Minimally invasive radio-guided parathyroidectomy right inferior parathyroid gland).  SURGEON:  Maisie Fus B. Samuella Cota, M.D.  ASSISTANT: 1. Anselm Pancoast. Zachery Dakins, M.D. 2. Velora Heckler, M.D.  ANESTHESIA:  General, anesthesiologist and CRNA.  DESCRIPTION OF PROCEDURE:  The patient was taken to the operating room and placed on the table in supine position.  After satisfactory general anesthetic with intubation, a sand bag was placed transversely beneath the shoulders. The patient was placed in thyroid position with slight reversed Trendelenburg position.  The neck was then prepped and draped as a sterile field.  The Neoprobe was used to identify a hot spot in the right neck just above the clavicle.  This was marked with a marking pen.  A small transverse incision was then made about 2 cm above the suprasternal notch in the right side of the neck.  This was taken through skin and subcutaneous tissues and platysma muscle.  The sternocleidomastoid muscle was retracted laterally and the strap muscles were retracted medially.  Dissection revealed an area of activity low in the neck.  The inferior thyroid artery was identified and a small vessel coming off medially was clipped with miniclips.  The adenoma was just medial to the inferior  thyroid artery.  This was dissected free and shelled out very nicely.  It measured 3 x 1.5 x 0.5 cm in size.  It had a vascular pedicle which was clamped and divided and the pedicle was ligated with 2-0 black silk suture.  The wound was irrigated, hemostasis was obtained.  The probe was placed into the wound and there was no increased activity.  The normal was about four times background.  The ____________ muscle  was then loosely attached to the strap muscles with interrupted sutures of 3-0 Vicryl.  the platysma muscle was approximated with interrupted sutures of 3-0 Vicryl and the skin itself was closed with several interrupted simple sutures of 4-0 Vicryl.  Benzoin and half inch Steri-Strips were used to reinforced the skin closure.  Dry sterile dressing using 2 x 2s and 2-inch HypaFix was applied.  The pathologist, Dr. Esther Hardy reported that this was parathyroid tissue. The patient seemed to tolerate the procedure well and was taken to the PACU in satisfactory condition. Dictated by:   Donnie Coffin Samuella Cota, M.D. Attending Physician:  Kandis Mannan DD:  06/19/01 TD:  06/19/01 Job: [id number]

## 2010-10-14 NOTE — Discharge Summary (Signed)
NAME:  [name], [name]                         ACCOUNT NO.:  [id number]   MEDICAL RECORD NO.:  [id number]                   PATIENT TYPE:  INP   LOCATION:  0451                                 FACILITY:  [facility]   PHYSICIAN:  Ermalene Searing. Leander Rams, M.D.                DATE OF BIRTH:  Sep 27, 1955   DATE OF ADMISSION:  06/20/2002  DATE OF DISCHARGE:  06/25/2002                                 DISCHARGE SUMMARY   DISCHARGE DIAGNOSES:  1. Diverticulitis with bloody stools, improved.  2. History of breast carcinoma.  3. Status post mastectomy in 1995.  4. Status post knee surgery.  5. Status post total abdominal hysterectomy.  6. Status post rectal abscess.   DISCHARGE MEDICATIONS:  1. Cipro 500 mg q.12h. for eight more days.  2. Flagyl 500 mg q.8h. for eight more days.  3. Zoloft 50 mg at bedtime.   CONSULTS:  None.   PROCEDURES AND STUDIES:  She had a CT of her abdomen and pelvis June 20, 2002 with contrast.  Abdominal portion revealed mild diverticulitis  involving the descending colon, surgical changes related to a TRAM flap with  a postoperative fluid collection near the anterior abdominal wall.  Pelvic  portion revealed diverticulitis involving the upper sigmoid colon without  evidence for discreet abscess, free air, or leaking contrast.   LABORATORY DATA:  WBC count 5.8, RBCs 4.21, hemoglobin 11.9, hematocrit  34.8, MCV 82.8, platelets 305,000.  Sodium 140, potassium 3.6, chloride 111,  CO2 26, glucose 102, BUN 4, creatinine 0.8, calcium 8.5, AST 28, ALT 25.  Urinalysis was negative.  Stool wbc's were negative.  C. difficile toxin was  negative.   DISPOSITION:  The patient will be discharged home.   CONDITION ON DISCHARGE:  Stable.   HISTORY OF PRESENT ILLNESS:  This is a 55 year old female who presented to  [facility] on January 23 with increased left lower quadrant pain.  The  patient reported the onset of left lower quadrant pain approximately nine  days prior for  which she had seen her primary care physician.  A CT done on  an outpatient basis revealed diverticulitis.  The patient was started on  p.o. Cipro and Flagyl along with a clear liquid diet.  The patient reported  very minimal improvement initially which then worsened approximately four  days prior to admission.  The patient reported intolerable pain with liquid  stools and some blood noted in her stools.  The patient was admitted for  further evaluation and treatment.   HOSPITAL COURSE:  Problem 1 - DIVERTICULITIS WITH BLOODY STOOLS:  The  patient was admitted, started on IV fluids along with IV Flagyl and Cipro.  CT scan of the pelvis and abdomen are as above.  CBC was obtained.  Stool  for wbc's and C. difficile was negative.  The patient progressed.  Was able  to tolerate a regular diet at discharge.  Abdomen is essentially nontender.  Her hemoglobin remained stable throughout her hospitalization.  She has been  discharged on additional eight days of antibiotic therapy to complete 14  days.  The patient has been instructed to follow a low residue diet.   DISCHARGE INSTRUCTIONS AND FOLLOWUP:  The patient has a follow-up  appointment with her primary care physician, Leanne Chang, M.D. on  February 6.  She has been instructed to complete her antibiotic therapy.     Stephanie Swazil[place], NP                      Ermalene Searing. Leander Rams, M.D.    SJ/MEDQ  D:  06/25/2002  T:  06/25/2002  Job:  [id number]   cc:   Leanne Chang, M.D.  [address]  [place]  [place] [address]  Fax: [phone]

## 2010-10-14 NOTE — Op Note (Signed)
NAMEKIERNAN, FARKAS               ACCOUNT NO.:  [id number]   MEDICAL RECORD NO.:  [id number]          PATIENT TYPE:  AMB   LOCATION:  DSC                          FACILITY:  [facility]   PHYSICIAN:  Lubertha Basque. Dalldorf, M.D.DATE OF BIRTH:  1955/12/12   DATE OF PROCEDURE:  08/07/2006  DATE OF DISCHARGE:                               OPERATIVE REPORT   PREOPERATIVE DIAGNOSES:  1. Left knee degenerative joint disease.  2. Left knee torn lateral meniscus.   POSTOPERATIVE DIAGNOSES:  1. Left knee degenerative joint disease.  2. Left knee torn lateral meniscus.   PROCEDURE:  1. Left knee abrasion chondroplasty.  2. Left knee partial lateral meniscectomy.   ANESTHESIA:  General.   ATTENDING SURGEON:  Lubertha Basque. Jerl Santos, M.D.   ASSISTANT:  Lindwood Qua, P.A.   INDICATIONS FOR PROCEDURE:  The patient is a 55 year old woman with a  long history of bilateral knee pain.  She is status post a fairly  successful arthroscopy the opposite knee many months ago which did  reveal some significant degenerative changes.  She has similar symptoms  on the left with pain and swelling unresponsive to injections and pills.  She is known to have some moderate degenerative change.  She has pain  which limits her ability to rest and walk and she is offered an  arthroscopy.  Informed operative consent was obtained after discussion  of possible complications of reaction to anesthesia and infection.  The  patient does understand that at some point she is going to need a larger  operation, i.e. total knee replacement.  She also wished have an  injection in the opposite knee at the same sitting.   SUMMARY OF FINDINGS AND PROCEDURE:  Under general anesthesia, an  arthroscopy of the left knee was performed.  She had advanced grade 4  degeneration patellofemoral, lateral, and moderate change medial.  Thorough chondroplasties were done medial and lateral.  I removed a  large spur from the intercondylar notch on  the tibia at this did appear  to be impinging at extension.  This was removed with a bur.  We created  some bleeding bone, especially in the lateral compartment.  She had  small lateral meniscus tear addressed with a tiny partial lateral  meniscectomy.  The ACL appeared to be intact.  The knee was thoroughly  irrigated and injected with some Depo-Medrol and the end of the case.  Also injected her opposite knee with Depo-Medrol.   DESCRIPTION OF PROCEDURE:  The patient was taken to an operating suite  where general anesthetic was applied without difficulty.  She was  positioned supine and prepped and draped in normal sterile fashion.  After the administration of preop IV Kefzol an arthroscopy of the left  knee was performed through a total of two portals.  Findings were as  noted above and procedure consisted of the tiny partial lateral  meniscectomy followed by thorough chondroplasty of large flaps of  articular cartilage both medial and lateral.  I did perform abrasion to  bleeding bone in the lateral compartment.  She had a large spur in the  anterior aspect the intercondylar notch on the tibia at the base of the  ACL and I removed this with a bur as it did appear to impinge in  extension.  The knee was thoroughly irrigated followed by placement of  Marcaine with epinephrine and morphine and some Depo-Medrol.  Adaptic  was applied over the portals followed by dry gauze and loose Ace wrap.  I then injected the opposite knee after sterile prep with Depo-Medrol  and Marcaine.  A Band-Aid was applied there.  Estimated blood loss and  intraoperative fluids can be obtained from anesthesia records.   DISPOSITION:  The patient was extubated in the operating room and taken  to recovery room in stable addition.  She was to go home same-day and  follow up in the office in less than a week.  I will contact her by  phone tonight.      Lubertha Basque Jerl Santos, M.D.  Electronically Signed      PGD/MEDQ  D:  08/07/2006  T:  08/07/2006  Job:  [id number]

## 2010-10-14 NOTE — Op Note (Signed)
[name], MIKUS               ACCOUNT NO.:  [id number]   MEDICAL RECORD NO.:  [id number]          PATIENT TYPE:  AMB   LOCATION:  DSC                          FACILITY:  [facility]   PHYSICIAN:  Lubertha Basque. Dalldorf, M.D.DATE OF BIRTH:  1956/02/21   DATE OF PROCEDURE:  02/20/2006  DATE OF DISCHARGE:                                 OPERATIVE REPORT   PREOPERATIVE DIAGNOSES:  1. Right knee torn medial meniscus.  2. Right knee degenerative joint disease.  3. Right foot neuroma.   POSTOPERATIVE DIAGNOSES:  1. Right knee torn medial meniscus.  2. Right knee degenerative joint disease.  3. Right foot neuroma.   PROCEDURES:  1. Right knee partial medial meniscectomy.  2. Right knee abrasion chondroplasty.  3. Right foot excision, Morton's neuroma, second and third interspaces.   ANESTHESIA:  General.   ATTENDING SURGEON:  Lubertha Basque. Jerl Santos, M.D.   ASSESSMENT:  [name], P.A.   INDICATIONS FOR PROCEDURE:  The patient is a 55 year old woman with a long  history of right knee pain.  She is a couple years out from a knee  arthroscopy, at which point we saw some grade 4 change, patellofemoral,  addressed with a drilling.  She did fairly well until about 6 months ago,  when she developed recurrent pain.  This has persisted despite oral anti-  inflammatories and several injections.  By MRI scan she has a medial  meniscus tear and some degenerative changes.  She is offered a repeat  arthroscopy, as she has pain which limits her ability to remain active and  to rest.  She also has had some trouble with her right foot, for which she  has seen a podiatrist.  Some conservative measures have been used to address  a Morton's neuroma.  She has had an MRI scan and other therapies.  She  wishes to have this addressed at the same sitting.  In the office most her  pain was in the second interspace, but today in the preanesthesia area more  of her pain was located in the third interspace.  We  elected to perform  excision of Morton's neuromas in both interspaces.  Informed operative  consent was obtained discussion of possible complications of, reaction to  anesthesia, infection, neurovascular injury.  She understood that her toes  would be numb in the area of her nerve excisions and accepted this.   SUMMARY OF FINDINGS AND PROCEDURE:  Under general anesthesia, [name]'s  right leg was addressed.  We first performed an arthroscopy of the right  knee.  She did have a small posterior horn medial meniscus tear and some  grade 3 change throughout the medial compartment.  A 5% partial medial  meniscectomy was done and a thorough chondroplasty was done, mostly of the  medial femoral condyle.  The ACL appeared to be intact.  There were an  abundance of loose bodies, cartilaginous in nature, that are removed from  the knee.  In the lateral compartment she had large fissures down to bone,  and a thorough abrasion to bleeding bone was done along with a chondroplasty  of very large flaps of loose articular cartilage.  The lateral meniscus  itself was intact.  She had grade 4 change across a broad area on the  lateral femoral condyle.  The patellofemoral joint exhibited focal areas of  grade 4 change and a chondroplasty was done here of loose flaps.  We then  addressed her foot.  I made an incision in the third interspace, followed by  an incision in the second interspace.  We dissected down and removed  prominent neuromas from each spot.   DESCRIPTION OF PROCEDURE:  The patient was taken to the operating suite,  where general anesthetic was applied without difficulty.  She was positioned  supine and prepped and draped in the normal sterile fashion.  After the  administration of preop IV Kefzol, an arthroscopy of the right knee was  performed through two portals.  Findings were as noted above.  Procedure  consisted of partial medial meniscectomy, followed by abrasion chondroplasty   lateral femoral condyle, and standard chondroplasty medial femoral condyle  and intracochlear groove.  An abundance of loose bodies were removed from  the knee.  The knee was thoroughly irrigated, followed by the placement of  Marcaine with epinephrine and morphine plus some Depo-Medrol.  We then  placed a sterile tourniquet around the calf.  Her leg was elevated,  exsanguinated, and a tourniquet inflated about the calf.  I then made a  dorsal incision in the third interspace with dissection down between the  metatarsal heads.  The intermetatarsal ligament was then cut and a small  neuroma was excised along with a branch of the nerves to the to the  adjoining toes.  We then performed an identical procedure in the second  interspace.  Here again, a small neuroma was removed.  The tourniquet was  deflated and toes became pink and warm immediately.  A small amount of  bleeding was easily controlled with Bovie cautery and pressure.  The two  incisions of the foot were reapproximated loosely with nylon followed by  Adaptic and a dry gauze dressing with a loose Ace wrap.  About her knee we  placed Adaptic, followed by dry gauze and a loose Ace wrap.  Estimated blood  loss and intraoperative fluids can be obtained from anesthesia records, as  can accurate tourniquet time.   DISPOSITION:  The patient was extubated in the operating room and taken to  the recovery room in stable addition.  She was to stay overnight for pain  control and observation with probable discharge home in the morning.      Lubertha Basque Jerl Santos, M.D.  Electronically Signed     PGD/MEDQ  D:  02/20/2006  T:  02/22/2006  Job:  [id number]

## 2010-10-18 ENCOUNTER — Telehealth: Payer: Self-pay | Admitting: *Deleted

## 2010-10-18 NOTE — Telephone Encounter (Signed)
10/17/10 Dr Shirlee Latch  reviewed monitor done 10/13/10--sinus tachycardia, some PAC's, no worrisome arrhythmia. LMTCB

## 2010-10-19 ENCOUNTER — Telehealth: Payer: Self-pay | Admitting: Cardiology

## 2010-10-19 NOTE — Telephone Encounter (Signed)
LMTCB

## 2010-10-19 NOTE — Telephone Encounter (Signed)
Pt returned your call. Please call her back.  

## 2010-10-19 NOTE — Telephone Encounter (Signed)
I talked with pt about monitor results.

## 2010-10-19 NOTE — Telephone Encounter (Signed)
Pt rtn your call/lg °

## 2010-10-19 NOTE — Telephone Encounter (Signed)
I talked with pt about monitor results done 10/13/10.

## 2010-10-21 ENCOUNTER — Ambulatory Visit (INDEPENDENT_AMBULATORY_CARE_PROVIDER_SITE_OTHER): Payer: Private Health Insurance - Indemnity | Admitting: Family Medicine

## 2010-10-21 VITALS — BP 122/82 | HR 96 | Temp 99.3°F | Wt 283.0 lb

## 2010-10-21 DIAGNOSIS — F329 Major depressive disorder, single episode, unspecified: Secondary | ICD-10-CM

## 2010-10-21 MED ORDER — DULOXETINE HCL 30 MG PO CPEP: 30.0000 mg | ORAL_CAPSULE | Freq: Every day | ORAL | Status: DC

## 2010-10-21 NOTE — Patient Instructions (Signed)
Schedule your complete physical in 6 months- do not eat before this appt Continue the Cymbalta for depression and pain- i'm so glad it's working!!! Call with any questions or concerns Have a great holiday weekend!

## 2010-10-21 NOTE — Progress Notes (Signed)
  Subjective:    Patient ID: [name], female    DOB: 03-Jun-1955, 55 y.o.   MRN: [id number]  HPI Depression- pt reports sxs have improved since starting Cymbalta.  'i'm able to sleep, i don't hurt like i used to.  i think it's working'.  Some shakiness but 'not bad'.  Went out last Saturday night for 1st time in a long time.  Able to get motivated to get things done.   Review of Systems For ROS see HPI     Objective:   Physical Exam  Psychiatric: She has a normal mood and affect. Her behavior is normal. Judgment and thought content normal.          Assessment & Plan:

## 2010-10-25 ENCOUNTER — Encounter: Payer: Self-pay | Admitting: Family Medicine

## 2010-10-25 NOTE — Assessment & Plan Note (Signed)
Pt's sxs have improved since starting Cymbalta.  Continue.  No changes at this time.  Will continue to follow.

## 2010-11-11 ENCOUNTER — Ambulatory Visit (INDEPENDENT_AMBULATORY_CARE_PROVIDER_SITE_OTHER): Payer: Private Health Insurance - Indemnity | Admitting: Family Medicine

## 2010-11-11 VITALS — BP 130/80 | Temp 99.2°F | Wt 291.0 lb

## 2010-11-11 DIAGNOSIS — R079 Chest pain, unspecified: Secondary | ICD-10-CM

## 2010-11-11 DIAGNOSIS — R609 Edema, unspecified: Secondary | ICD-10-CM

## 2010-11-11 LAB — BASIC METABOLIC PANEL
BUN: 14 mg/dL (ref 6–23)
CO2: 33 mEq/L — ABNORMAL HIGH (ref 19–32)
Calcium: 9.6 mg/dL (ref 8.4–10.5)
Chloride: 101 mEq/L (ref 96–112)
Creatinine, Ser: 0.7 mg/dL (ref 0.4–1.2)
GFR: 91.9 mL/min (ref >60.00)
Glucose, Bld: 107 mg/dL — ABNORMAL HIGH (ref 70–99)
Potassium: 4.8 mEq/L (ref 3.5–5.1)
Sodium: 140 mEq/L (ref 135–145)

## 2010-11-11 LAB — BRAIN NATRIURETIC PEPTIDE: Pro B Natriuretic peptide (BNP): 8 pg/mL (ref 0.0–100.0)

## 2010-11-11 NOTE — Patient Instructions (Signed)
Increase the Lasix to 2 pills in the morning and then 1-2 pills in the afternoon as needed for swelling We'll notify you of your lab results Continue to drink plenty of water Elevate your legs as much as possible Limit you salt intake- including restaurant and fast food Hang in there!!!

## 2010-11-11 NOTE — Progress Notes (Signed)
  Subjective:    Patient ID: [name], female    DOB: 10-07-1955, 55 y.o.   MRN: [id number]  HPI Edema- sxs started Wednesday.  Legs will get 'massively swollen and tight'.  Feels that she is retaining fluid in her face as well.  Is taking 40mg  of Lasix daily since Wednesday w/out relief.  Denies SOB, CP.  Tuesday had [employer], today had [employer].  Swelling will improve slightly w/ elevation.   Review of Systems For ROS see HPI     Objective:   Physical Exam  Constitutional: She appears well-developed and well-nourished. No distress.  HENT:  Head: Normocephalic and atraumatic.  Neck: Normal range of motion. Neck supple. No thyromegaly present.  Cardiovascular: Normal rate, regular rhythm, normal heart sounds and intact distal pulses.   No murmur heard. Pulmonary/Chest: Effort normal and breath sounds normal. No respiratory distress. She has no wheezes. She has no rales.  Musculoskeletal: She exhibits edema (nonpitting B LE edema).  Lymphadenopathy:    She has no cervical adenopathy.          Assessment & Plan:

## 2010-11-20 NOTE — Assessment & Plan Note (Signed)
Pt's edema is most likely related to her sodium intake at restaurants and w/ fast food.  Stressed importance of low Na diet and increasing fluid intake.  Check BMP to assess K+ w/ increased lasix use, check BNP to r/o cardiac involvement.

## 2011-01-16 ENCOUNTER — Encounter: Payer: Self-pay | Admitting: Family Medicine

## 2011-01-16 ENCOUNTER — Ambulatory Visit (INDEPENDENT_AMBULATORY_CARE_PROVIDER_SITE_OTHER): Payer: Private Health Insurance - Indemnity | Admitting: Family Medicine

## 2011-01-16 VITALS — BP 122/70 | Temp 99.1°F | Wt 290.8 lb

## 2011-01-16 DIAGNOSIS — R609 Edema, unspecified: Secondary | ICD-10-CM

## 2011-01-16 DIAGNOSIS — M542 Cervicalgia: Secondary | ICD-10-CM

## 2011-01-16 MED ORDER — CYCLOBENZAPRINE HCL 10 MG PO TABS: 10.0000 mg | ORAL_TABLET | Freq: Three times a day (TID) | ORAL | Status: AC | PRN

## 2011-01-16 NOTE — Assessment & Plan Note (Signed)
Pt's pain is most likely due to tight trap spasm.  No bony tenderness, full ROM of neck and shoulder.  Start flexeril, continue mobic, heating pad prn.  Reviewed supportive care and red flags that should prompt return.  Pt expressed understanding and is in agreement w/ plan.

## 2011-01-16 NOTE — Patient Instructions (Signed)
This all appears to be muscular and will improve w/ heat and flexeril Continue to stretch Take the Lasix as needed for swelling- try and limit your salt intake Call with any questions or concerns GOOD LUCK, GRANDMA!!!

## 2011-01-16 NOTE — Assessment & Plan Note (Signed)
Lasix prn.  Asymptomatic today.

## 2011-01-16 NOTE — Progress Notes (Signed)
  Subjective:    Patient ID: [name], female    DOB: [birth year], 55 y.o.   MRN: [id number]  HPI Neck pain- sxs started 3 weeks ago, L sided.  'when i turn to the left it's like electricity'.  Pain is radiating up into neck, down into shoulder, and arm.  Feels like a tens unit.  Improves w/ movement and stretching.  Denies new mattress, pillows, sleeping anywhere new or different.  Edema- taking lasix as needed for swelling.  Asymptomatic today.   Review of Systems For ROS see HPI     Objective:   Physical Exam  Vitals reviewed. Constitutional: She appears well-developed and well-nourished.  Musculoskeletal: Normal range of motion. She exhibits tenderness (L sided trap spasm). She exhibits no edema (no LE edema).  Neurological: She has normal reflexes. No cranial nerve deficit. Coordination normal.          Assessment & Plan:

## 2011-01-26 ENCOUNTER — Telehealth: Payer: Self-pay | Admitting: Family Medicine

## 2011-01-26 MED ORDER — ESOMEPRAZOLE MAGNESIUM 40 MG PO CPDR: 40.0000 mg | DELAYED_RELEASE_CAPSULE | Freq: Every day | ORAL | Status: DC

## 2011-01-26 MED ORDER — DULOXETINE HCL 20 MG PO CPEP
ORAL_CAPSULE | ORAL | Status: DC
Start: 2011-01-26 — End: 2011-12-06

## 2011-01-26 NOTE — Telephone Encounter (Signed)
Ok for Nexium.  Will need to wean off Cymbalta.  Decrease to 20mg  x2 weeks and then take 1 tab every other day x2 weeks and then stop.  Will need new script for Cymbalta 20mg , #30, no refills.

## 2011-01-26 NOTE — Telephone Encounter (Signed)
Done and pt aware

## 2011-02-01 ENCOUNTER — Other Ambulatory Visit: Payer: Self-pay | Admitting: Family Medicine

## 2011-02-03 MED ORDER — ESOMEPRAZOLE MAGNESIUM 40 MG PO CPDR: 40.0000 mg | DELAYED_RELEASE_CAPSULE | Freq: Every day | ORAL | Status: DC

## 2011-02-03 NOTE — Telephone Encounter (Signed)
Rx resent again, originally sent 01-26-11

## 2011-02-09 ENCOUNTER — Telehealth: Payer: Self-pay | Admitting: *Deleted

## 2011-02-09 NOTE — Telephone Encounter (Signed)
Prior auth approved 02-08-11-02-08-12, approval letter scan to chart.

## 2011-02-10 ENCOUNTER — Other Ambulatory Visit: Payer: Self-pay | Admitting: Family Medicine

## 2011-02-10 MED ORDER — TELMISARTAN-HCTZ 80-25 MG PO TABS: 1.0000 | ORAL_TABLET | Freq: Every day | ORAL | Status: DC

## 2011-02-10 NOTE — Telephone Encounter (Signed)
Rx sent 

## 2011-02-22 LAB — POCT HEMOGLOBIN-HEMACUE: Hemoglobin: 11.8 — ABNORMAL LOW

## 2011-02-22 LAB — BASIC METABOLIC PANEL
BUN: 15
CO2: 30
Calcium: 9.1
Chloride: 103
Creatinine, Ser: 0.89
GFR calc Af Amer: >60
GFR calc non Af Amer: >60
Glucose, Bld: 115 — ABNORMAL HIGH
Potassium: 4.1
Sodium: 140

## 2011-03-21 ENCOUNTER — Other Ambulatory Visit: Payer: Self-pay | Admitting: Family Medicine

## 2011-03-22 MED ORDER — FUROSEMIDE 20 MG PO TABS: 20.0000 mg | ORAL_TABLET | ORAL | Status: DC

## 2011-03-22 NOTE — Telephone Encounter (Signed)
rx sent to pharmacy by e-script  

## 2011-03-24 ENCOUNTER — Other Ambulatory Visit: Payer: Self-pay | Admitting: Family Medicine

## 2011-03-24 MED ORDER — FUROSEMIDE 20 MG PO TABS: 20.0000 mg | ORAL_TABLET | ORAL | Status: DC

## 2011-03-24 NOTE — Telephone Encounter (Signed)
Refill for lasik was sent to mail order - patient would like 30 day supply [facility] [address] - said she is out of med & feet are swelling

## 2011-03-24 NOTE — Telephone Encounter (Signed)
Rx sent, left Pt detail message Rx sent.

## 2011-03-27 ENCOUNTER — Ambulatory Visit (INDEPENDENT_AMBULATORY_CARE_PROVIDER_SITE_OTHER): Payer: Private Health Insurance - Indemnity | Admitting: Family Medicine

## 2011-03-27 ENCOUNTER — Encounter: Payer: Self-pay | Admitting: Family Medicine

## 2011-03-27 DIAGNOSIS — Z23 Encounter for immunization: Secondary | ICD-10-CM

## 2011-03-27 DIAGNOSIS — R609 Edema, unspecified: Secondary | ICD-10-CM

## 2011-03-27 DIAGNOSIS — H109 Unspecified conjunctivitis: Secondary | ICD-10-CM

## 2011-03-27 LAB — HEPATIC FUNCTION PANEL
ALT: 32 U/L (ref 0–35)
AST: 30 U/L (ref 0–37)
Albumin: 3.6 g/dL (ref 3.5–5.2)
Alkaline Phosphatase: 72 U/L (ref 39–117)
Bilirubin, Direct: 0 mg/dL (ref 0.0–0.3)
Total Bilirubin: 0.2 mg/dL — ABNORMAL LOW (ref 0.3–1.2)
Total Protein: 6.6 g/dL (ref 6.0–8.3)

## 2011-03-27 LAB — TSH: TSH: 0.44 u[IU]/mL (ref 0.35–5.50)

## 2011-03-27 LAB — BASIC METABOLIC PANEL
BUN: 14 mg/dL (ref 6–23)
CO2: 28 mEq/L (ref 19–32)
Calcium: 9.1 mg/dL (ref 8.4–10.5)
Chloride: 106 mEq/L (ref 96–112)
Creatinine, Ser: 0.8 mg/dL (ref 0.4–1.2)
GFR: 83.47 mL/min (ref >60.00)
Glucose, Bld: 122 mg/dL — ABNORMAL HIGH (ref 70–99)
Potassium: 3.8 mEq/L (ref 3.5–5.1)
Sodium: 141 mEq/L (ref 135–145)

## 2011-03-27 LAB — BRAIN NATRIURETIC PEPTIDE: Pro B Natriuretic peptide (BNP): 39 pg/mL (ref 0.0–100.0)

## 2011-03-27 MED ORDER — POLYMYXIN B-TRIMETHOPRIM 10000-0.1 UNIT/ML-% OP SOLN: OPHTHALMIC | Status: DC

## 2011-03-27 MED ORDER — FUROSEMIDE 40 MG PO TABS: 40.0000 mg | ORAL_TABLET | Freq: Two times a day (BID) | ORAL | Status: DC

## 2011-03-27 NOTE — Patient Instructions (Signed)
We'll notify you of your lab results STOP the Mobic Start 3 ibuprofen three times daily- take w/ food Your heart sounds fine and the ECHO from January was perfect!  This is good news!

## 2011-03-27 NOTE — Assessment & Plan Note (Signed)
Pt reports this is worsening.  sxs now unresponsive to Lasix.  Had normal ECHO Jan 2012.  Check labs to r/o metabolic abnormalities, CHF.  Increase Lasix dose.  Reviewed supportive care and red flags that should prompt return.  Pt expressed understanding and is in agreement w/ plan.

## 2011-03-27 NOTE — Assessment & Plan Note (Signed)
New problem.  Start abx eye drops.  Reviewed supportive care and red flags that should prompt return.  Pt expressed understanding and is in agreement w/ plan.

## 2011-03-27 NOTE — Progress Notes (Signed)
  Subjective:    Patient ID: [name], female    DOB: 10-14-1955, 55 y.o.   MRN: [id number]  HPI R eye redness- began last night.  Feels gritty, 'like i have mess in it'.  Retaining fluid- reports 'elephant ankles', 'cabbage patch kid' face, hands are tight.  Taking 2 lasix tabs daily w/out relief.  Took 4 lasix tabs 1 day w/out relief.  Swelling is no longer resolving at night.  Not eating salt.   Review of Systems For ROS see HPI     Objective:   Physical Exam  Vitals reviewed. Constitutional: She appears well-developed and well-nourished. No distress.  HENT:  Head: Normocephalic and atraumatic.       Face appears puffy  Eyes: EOM are normal. Pupils are equal, round, and reactive to light.       R conjunctival injxn w/ limbic sparing  Neck: Normal range of motion. Neck supple. No thyromegaly present.  Cardiovascular: Normal rate, regular rhythm, normal heart sounds and intact distal pulses.   No murmur heard. Pulmonary/Chest: Effort normal and breath sounds normal. No respiratory distress. She has no wheezes. She has no rales.  Abdominal: Soft. Bowel sounds are normal. She exhibits no distension. There is no tenderness. There is no rebound and no guarding.  Musculoskeletal: She exhibits edema (nonpitting edema of feet and lower legs bilaterally).  Lymphadenopathy:    She has no cervical adenopathy.  Skin: Skin is warm and dry.          Assessment & Plan:

## 2011-05-31 ENCOUNTER — Encounter: Payer: Self-pay | Admitting: Internal Medicine

## 2011-06-14 ENCOUNTER — Encounter: Payer: Self-pay | Admitting: Internal Medicine

## 2011-06-20 ENCOUNTER — Telehealth: Payer: Self-pay | Admitting: *Deleted

## 2011-06-20 NOTE — Telephone Encounter (Signed)
Pt called in and advised that her work advised they really were not interested in her waist measrument,pt daughter will pick up form at front desk today

## 2011-06-20 NOTE — Telephone Encounter (Signed)
Pt mailed letter and form to request information transfer to health screen form from last OV,however noted no measurment for pt waist noted in chart, left message for pt to call office.

## 2011-06-22 ENCOUNTER — Telehealth: Payer: Self-pay | Admitting: Family Medicine

## 2011-06-22 MED ORDER — TELMISARTAN-HCTZ 80-25 MG PO TABS: 1.0000 | ORAL_TABLET | Freq: Every day | ORAL | Status: DC

## 2011-06-22 NOTE — Telephone Encounter (Signed)
Patient request for new prescription for Micardis HCT.

## 2011-06-22 NOTE — Telephone Encounter (Signed)
rx sent to pharmacy by e-script  

## 2011-07-05 ENCOUNTER — Telehealth: Payer: Self-pay | Admitting: Family Medicine

## 2011-07-05 ENCOUNTER — Ambulatory Visit (INDEPENDENT_AMBULATORY_CARE_PROVIDER_SITE_OTHER): Payer: Managed Care, Other (non HMO) | Admitting: Family Medicine

## 2011-07-05 ENCOUNTER — Ambulatory Visit (AMBULATORY_SURGERY_CENTER): Payer: Managed Care, Other (non HMO) | Admitting: *Deleted

## 2011-07-05 ENCOUNTER — Encounter: Payer: Self-pay | Admitting: Family Medicine

## 2011-07-05 VITALS — Ht 67.0 in | Wt 297.8 lb

## 2011-07-05 DIAGNOSIS — H109 Unspecified conjunctivitis: Secondary | ICD-10-CM

## 2011-07-05 DIAGNOSIS — J019 Acute sinusitis, unspecified: Secondary | ICD-10-CM

## 2011-07-05 DIAGNOSIS — F329 Major depressive disorder, single episode, unspecified: Secondary | ICD-10-CM

## 2011-07-05 DIAGNOSIS — Z1211 Encounter for screening for malignant neoplasm of colon: Secondary | ICD-10-CM

## 2011-07-05 DIAGNOSIS — F3289 Other specified depressive episodes: Secondary | ICD-10-CM

## 2011-07-05 MED ORDER — DOXYCYCLINE HYCLATE 100 MG PO TABS: 100.0000 mg | ORAL_TABLET | Freq: Two times a day (BID) | ORAL | Status: DC

## 2011-07-05 MED ORDER — DOXYCYCLINE HYCLATE 100 MG PO TABS: 100.0000 mg | ORAL_TABLET | Freq: Two times a day (BID) | ORAL | Status: AC

## 2011-07-05 MED ORDER — TELMISARTAN-HCTZ 80-25 MG PO TABS: 1.0000 | ORAL_TABLET | Freq: Every day | ORAL | Status: DC

## 2011-07-05 MED ORDER — DULOXETINE HCL 30 MG PO CPEP: 30.0000 mg | ORAL_CAPSULE | Freq: Every day | ORAL | Status: DC

## 2011-07-05 MED ORDER — MOVIPREP 100 G PO SOLR: ORAL | Status: DC

## 2011-07-05 NOTE — Progress Notes (Signed)
  Subjective:    Patient ID: [name], female    DOB: 09/26/1955, 56 y.o.   MRN: [id number]  HPI URI- sxs started Sunday, yesterday 'felt really rotten'.  + nasal congestion, cough, sore throat, swollen glands, bilateral ear pain.  Sunday had low grade temp.  + sinus pain.  + sick contacts.  No N/V/D.  Pink eye- L eye, sxs started this AM.  Eye feels gritty, + drainage.  Started Polytrim this AM.  Depression- 'in December i really wanted to die'.  Chronic problem for pt but this is worst it's been.  Previously felt Cymbalta was working but stopped due to unwanted weight gain.  Now realizes it was the depression causing weight gain and not the medicine.   Review of Systems For ROS see HPI     Objective:   Physical Exam  Vitals reviewed. Constitutional: She appears well-developed and well-nourished. No distress.  HENT:  Head: Normocephalic and atraumatic.  Right Ear: Tympanic membrane normal.  Left Ear: Tympanic membrane normal.  Nose: Mucosal edema and rhinorrhea present. Right sinus exhibits maxillary sinus tenderness and frontal sinus tenderness. Left sinus exhibits maxillary sinus tenderness and frontal sinus tenderness.  Mouth/Throat: Uvula is midline and mucous membranes are normal. Posterior oropharyngeal erythema present. No oropharyngeal exudate.  Eyes: EOM are normal. Pupils are equal, round, and reactive to light. Left eye exhibits discharge.       Conjunctival injxn w/ limbic sparing on L  Neck: Normal range of motion. Neck supple.  Cardiovascular: Normal rate, regular rhythm and normal heart sounds.   Pulmonary/Chest: Effort normal and breath sounds normal. No respiratory distress. She has no wheezes.  Lymphadenopathy:    She has no cervical adenopathy.  Psychiatric: She has a normal mood and affect. Her behavior is normal. Thought content normal.          Assessment & Plan:

## 2011-07-05 NOTE — Patient Instructions (Signed)
Follow up in 1 month to recheck mood Start the Cymbalta daily Start the Doxy twice daily for the sinus infection Continue the polytrim eye drops Call with any questions or concerns Hang in there!!!

## 2011-07-05 NOTE — Telephone Encounter (Signed)
The pt called and is hoping to get a note to be out of work tomorrow (2/7)  Please advise if this is okay.  Thanks!!

## 2011-07-05 NOTE — Telephone Encounter (Signed)
Ok to be out of work tomorrow- please print note

## 2011-07-06 ENCOUNTER — Encounter: Payer: Self-pay | Admitting: Internal Medicine

## 2011-07-09 NOTE — Assessment & Plan Note (Signed)
Deteriorated.  Restart Cymbalta as pt needs a med to control these severe sxs.  Currently able to contract for safety.  Will follow closely.

## 2011-07-09 NOTE — Assessment & Plan Note (Signed)
L sided.  Pt has eye drops remaining from previous infxn that have not expired.  Resume use.  Reviewed supportive care and red flags that should prompt return.  Pt expressed understanding and is in agreement w/ plan.

## 2011-07-09 NOTE — Assessment & Plan Note (Signed)
Pt's sxs and PE consistent w/ infxn.  Start abx.  Reviewed supportive care and red flags that should prompt return.  Pt expressed understanding and is in agreement w/ plan.  

## 2011-07-10 ENCOUNTER — Other Ambulatory Visit: Payer: Self-pay | Admitting: Family Medicine

## 2011-07-10 DIAGNOSIS — Z1231 Encounter for screening mammogram for malignant neoplasm of breast: Secondary | ICD-10-CM

## 2011-07-19 ENCOUNTER — Other Ambulatory Visit: Payer: Private Health Insurance - Indemnity | Admitting: Internal Medicine

## 2011-07-27 ENCOUNTER — Telehealth: Payer: Self-pay | Admitting: Family Medicine

## 2011-07-27 MED ORDER — ESOMEPRAZOLE MAGNESIUM 40 MG PO CPDR: 40.0000 mg | DELAYED_RELEASE_CAPSULE | Freq: Every day | ORAL | Status: DC

## 2011-07-27 NOTE — Telephone Encounter (Signed)
Fax received from [facility]-mail service states patient wants new prescription for Nexium Based on chart review below is what I found esomeprazole (NEXIUM) 40 MG capsule  Last written 02/03/11 Thanks

## 2011-07-27 NOTE — Telephone Encounter (Signed)
rx sent to pharmacy by e-script  

## 2011-08-03 ENCOUNTER — Encounter: Payer: Self-pay | Admitting: Family Medicine

## 2011-08-03 ENCOUNTER — Ambulatory Visit (INDEPENDENT_AMBULATORY_CARE_PROVIDER_SITE_OTHER): Payer: Managed Care, Other (non HMO) | Admitting: Family Medicine

## 2011-08-03 VITALS — BP 126/82 | HR 99 | Temp 99.9°F | Ht 66.75 in | Wt 297.8 lb

## 2011-08-03 DIAGNOSIS — H61899 Other specified disorders of external ear, unspecified ear: Secondary | ICD-10-CM | POA: Insufficient documentation

## 2011-08-03 DIAGNOSIS — F329 Major depressive disorder, single episode, unspecified: Secondary | ICD-10-CM

## 2011-08-03 DIAGNOSIS — R5381 Other malaise: Secondary | ICD-10-CM

## 2011-08-03 DIAGNOSIS — F3289 Other specified depressive episodes: Secondary | ICD-10-CM

## 2011-08-03 DIAGNOSIS — R5383 Other fatigue: Secondary | ICD-10-CM

## 2011-08-03 HISTORY — DX: Other fatigue: R53.83

## 2011-08-03 HISTORY — DX: Other specified disorders of external ear, unspecified ear: H61.899

## 2011-08-03 MED ORDER — FLUOCINOLONE ACETONIDE 0.01 % OT OIL: 5.0000 [drp] | TOPICAL_OIL | Freq: Two times a day (BID) | OTIC | Status: DC

## 2011-08-03 MED ORDER — ESOMEPRAZOLE MAGNESIUM 40 MG PO CPDR: 40.0000 mg | DELAYED_RELEASE_CAPSULE | Freq: Every day | ORAL | Status: DC

## 2011-08-03 NOTE — Progress Notes (Signed)
  Subjective:    Patient ID: [name], female    DOB: 07/16/1955, 56 y.o.   MRN: [id number]  HPI Depression- pt does not notice a difference in mood since starting Cymbalta.  Coworker has commented on improvement.  Fatigue- 'absolutely no energy'.  Sleeping well.  'it's all I can do to get through the day'.  Has hx of Vit D deficiency.  L ear discomfort- sxs started 2 weeks ago.  No drainage, 'it's not pain'.  + hoarseness, some chest congestion, productive cough.  Denies facial pain/pressure.    Review of Systems For ROS see HPI     Objective:   Physical Exam  Vitals reviewed. Constitutional: She appears well-developed and well-nourished. No distress.  HENT:  Head: Normocephalic and atraumatic.  Right Ear: Tympanic membrane normal.  Left Ear: Tympanic membrane normal.  Nose: Mucosal edema and rhinorrhea present. Right sinus exhibits no maxillary sinus tenderness and no frontal sinus tenderness. Left sinus exhibits no maxillary sinus tenderness and no frontal sinus tenderness.  Mouth/Throat: Mucous membranes are normal. Posterior oropharyngeal erythema (w/ PND) present.       Dry, flaky areas of ear canals  Eyes: Conjunctivae and EOM are normal. Pupils are equal, round, and reactive to light.  Neck: Normal range of motion. Neck supple.  Cardiovascular: Normal rate, regular rhythm and normal heart sounds.   Pulmonary/Chest: Effort normal and breath sounds normal. No respiratory distress. She has no wheezes. She has no rales.       + wet cough  Lymphadenopathy:    She has no cervical adenopathy.  Psychiatric: She has a normal mood and affect. Her behavior is normal. Judgment and thought content normal.          Assessment & Plan:

## 2011-08-03 NOTE — Patient Instructions (Signed)
We'll notify you of your lab results I think you're getting a virus Continue the Cymbalta- I think it's working!! Start the DermOtic for the dry itchy ears- use for 5-7 days at a time Call with any questions or concerns Hang in there! Happy [birth year] Birthday!

## 2011-08-04 LAB — CBC WITH DIFFERENTIAL/PLATELET
Basophils Absolute: 0 10*3/uL (ref 0.0–0.1)
Basophils Relative: 0.4 % (ref 0.0–3.0)
Eosinophils Absolute: 0 10*3/uL (ref 0.0–0.7)
Eosinophils Relative: 0.8 % (ref 0.0–5.0)
HCT: 32.7 % — ABNORMAL LOW (ref 36.0–46.0)
Hemoglobin: 10.5 g/dL — ABNORMAL LOW (ref 12.0–15.0)
Lymphocytes Relative: 47 % — ABNORMAL HIGH (ref 12.0–46.0)
Lymphs Abs: 2.4 10*3/uL (ref 0.7–4.0)
MCHC: 32.1 g/dL (ref 30.0–36.0)
MCV: 78.5 fl (ref 78.0–100.0)
Monocytes Absolute: 0.4 10*3/uL (ref 0.1–1.0)
Monocytes Relative: 6.9 % (ref 3.0–12.0)
Neutro Abs: 2.3 10*3/uL (ref 1.4–7.7)
Neutrophils Relative %: 44.9 % (ref 43.0–77.0)
Platelets: 252 10*3/uL (ref 150.0–400.0)
RBC: 4.17 Mil/uL (ref 3.87–5.11)
RDW: 18.4 % — ABNORMAL HIGH (ref 11.5–14.6)
WBC: 5.2 10*3/uL (ref 4.5–10.5)

## 2011-08-04 LAB — HEPATIC FUNCTION PANEL
ALT: 26 U/L (ref 0–35)
AST: 27 U/L (ref 0–37)
Albumin: 3.8 g/dL (ref 3.5–5.2)
Alkaline Phosphatase: 75 U/L (ref 39–117)
Bilirubin, Direct: 0.1 mg/dL (ref 0.0–0.3)
Total Bilirubin: 0.2 mg/dL — ABNORMAL LOW (ref 0.3–1.2)
Total Protein: 7.2 g/dL (ref 6.0–8.3)

## 2011-08-04 LAB — BASIC METABOLIC PANEL
BUN: 15 mg/dL (ref 6–23)
CO2: 29 mEq/L (ref 19–32)
Calcium: 9.5 mg/dL (ref 8.4–10.5)
Chloride: 105 mEq/L (ref 96–112)
Creatinine, Ser: 0.9 mg/dL (ref 0.4–1.2)
GFR: 68.59 mL/min (ref >60.00)
Glucose, Bld: 110 mg/dL — ABNORMAL HIGH (ref 70–99)
Potassium: 3.9 mEq/L (ref 3.5–5.1)
Sodium: 145 mEq/L (ref 135–145)

## 2011-08-04 LAB — VITAMIN B12: Vitamin B-12: 429 pg/mL (ref 211–911)

## 2011-08-04 LAB — TSH: TSH: 0.86 u[IU]/mL (ref 0.35–5.50)

## 2011-08-05 LAB — VITAMIN D 1,25 DIHYDROXY
Vitamin D 1, 25 (OH)2 Total: 58 pg/mL (ref 18–72)
Vitamin D2 1, 25 (OH)2: <8 pg/mL
Vitamin D3 1, 25 (OH)2: 58 pg/mL

## 2011-08-08 ENCOUNTER — Telehealth: Payer: Self-pay | Admitting: Family Medicine

## 2011-08-08 MED ORDER — FUROSEMIDE 40 MG PO TABS: 40.0000 mg | ORAL_TABLET | Freq: Two times a day (BID) | ORAL | Status: DC

## 2011-08-08 NOTE — Telephone Encounter (Signed)
rx sent to pharmacy by e-script  

## 2011-08-08 NOTE — Telephone Encounter (Signed)
Refill: Furosemide 40 mg tablet. Take 1 tablet twice a day. Qty 60. Last fill 12.28.12. Request for 90 day supply.

## 2011-08-09 ENCOUNTER — Other Ambulatory Visit: Payer: Self-pay | Admitting: *Deleted

## 2011-08-09 NOTE — Telephone Encounter (Signed)
Pt called in to advise that she had not received her Nexium from [facility] and wanted to verify the RX had been sent, noted in chart that

## 2011-08-09 NOTE — Telephone Encounter (Signed)
Left samples for nexium up front for pt pick up per the medication was sent escribe on 08-03-11

## 2011-08-10 ENCOUNTER — Ambulatory Visit
Admission: RE | Admit: 2011-08-10 | Discharge: 2011-08-10 | Disposition: A | Discharge status: Home / Self Care | Payer: Managed Care, Other (non HMO) | Source: Ambulatory Visit | Attending: Family Medicine | Admitting: Family Medicine

## 2011-08-10 DIAGNOSIS — Z1231 Encounter for screening mammogram for malignant neoplasm of breast: Secondary | ICD-10-CM

## 2011-08-14 ENCOUNTER — Ambulatory Visit (AMBULATORY_SURGERY_CENTER): Payer: Managed Care, Other (non HMO) | Admitting: Internal Medicine

## 2011-08-14 ENCOUNTER — Encounter: Payer: Self-pay | Admitting: Internal Medicine

## 2011-08-14 VITALS — BP 124/73 | HR 82 | Temp 97.6°F | Resp 18 | Ht 66.0 in | Wt 297.0 lb

## 2011-08-14 DIAGNOSIS — Z860100 Personal history of colon polyps, unspecified: Secondary | ICD-10-CM

## 2011-08-14 DIAGNOSIS — K635 Polyp of colon: Secondary | ICD-10-CM

## 2011-08-14 DIAGNOSIS — D126 Benign neoplasm of colon, unspecified: Secondary | ICD-10-CM

## 2011-08-14 DIAGNOSIS — K5730 Diverticulosis of large intestine without perforation or abscess without bleeding: Secondary | ICD-10-CM

## 2011-08-14 DIAGNOSIS — Z1211 Encounter for screening for malignant neoplasm of colon: Secondary | ICD-10-CM

## 2011-08-14 HISTORY — DX: Personal history of colon polyps, unspecified: Z86.0100

## 2011-08-14 HISTORY — PX: COLONOSCOPY: SHX174

## 2011-08-14 MED ORDER — SODIUM CHLORIDE 0.9 % IV SOLN: 500.0000 mL | INTRAVENOUS | Status: DC

## 2011-08-14 NOTE — Progress Notes (Signed)
Patient did not have preoperative order for IV antibiotic SSI prophylaxis. (G8918)  Patient did not experience any of the following events: a burn prior to discharge; a fall within the facility; wrong site/side/patient/procedure/implant event; or a hospital transfer or hospital admission upon discharge from the facility. (G8907)  

## 2011-08-14 NOTE — Patient Instructions (Signed)
YOU HAD AN ENDOSCOPIC PROCEDURE TODAY AT THE [facility]: Refer to the procedure report that was given to you for any specific questions about what was found during the examination.  If the procedure report does not answer your questions, please call your gastroenterologist to clarify.  If you requested that your care partner not be given the details of your procedure findings, then the procedure report has been included in a sealed envelope for you to review at your convenience later.  YOU SHOULD EXPECT: Some feelings of bloating in the abdomen. Passage of more gas than usual.  Walking can help get rid of the air that was put into your GI tract during the procedure and reduce the bloating. If you had a lower endoscopy (such as a colonoscopy or flexible sigmoidoscopy) you may notice spotting of blood in your stool or on the toilet paper. If you underwent a bowel prep for your procedure, then you may not have a normal bowel movement for a few days.  DIET: Your first meal following the procedure should be a light meal and then it is ok to progress to your normal diet.  A half-sandwich or bowl of soup is an example of a good first meal.  Heavy or fried foods are harder to digest and may make you feel nauseous or bloated.  Likewise meals heavy in dairy and vegetables can cause extra gas to form and this can also increase the bloating.  Drink plenty of fluids but you should avoid alcoholic beverages for 24 hours.  ACTIVITY: Your care partner should take you home directly after the procedure.  You should plan to take it easy, moving slowly for the rest of the day.  You can resume normal activity the day after the procedure however you should NOT DRIVE or use heavy machinery for 24 hours (because of the sedation medicines used during the test).    SYMPTOMS TO REPORT IMMEDIATELY: A gastroenterologist can be reached at any hour.  During normal business hours, 8:30 AM to 5:00 PM Monday through Friday,  call [phone].  After hours and on weekends, please call the GI answering service at [phone] who will take a message and have the physician on call contact you.   Following lower endoscopy (colonoscopy or flexible sigmoidoscopy):  Excessive amounts of blood in the stool  Significant tenderness or worsening of abdominal pains  Swelling of the abdomen that is new, acute  Fever of 100F or higher    FOLLOW UP: If any biopsies were taken you will be contacted by phone or by letter within the next 1-3 weeks.  Call your gastroenterologist if you have not heard about the biopsies in 3 weeks.  Our staff will call the home number listed on your records the next business day following your procedure to check on you and address any questions or concerns that you may have at that time regarding the information given to you following your procedure. This is a courtesy call and so if there is no answer at the home number and we have not heard from you through the emergency physician on call, we will assume that you have returned to your regular daily activities without incident.  SIGNATURES/CONFIDENTIALITY: You and/or your care partner have signed paperwork which will be entered into your electronic medical record.  These signatures attest to the fact that that the information above on your After Visit Summary has been reviewed and is understood.  Full responsibility of the confidentiality   of this discharge information lies with you and/or your care-partner.     

## 2011-08-14 NOTE — Op Note (Signed)
[facility] [address] [place], [place]  [address]  COLONOSCOPY PROCEDURE REPORT  PATIENT:  [name], [name]  MR#:  [id number] BIRTHDATE:  [birth year], 56 yrs. old  GENDER:  female ENDOSCOPIST:  Iva Boop, MD, [facility]  PROCEDURE DATE:  08/14/2011 PROCEDURE:  Colonoscopy with snare polypectomy ASA CLASS:  Class II INDICATIONS:  surveillance and high-risk screening, history of pre-cancerous (adenomatous) colon polyps diminutive adenoma removed 03/2006 index colonoscopy MEDICATIONS:   These medications were titrated to patient response per physician's verbal order, Fentanyl 75 mcg IV, Versed 8 mg IV  DESCRIPTION OF PROCEDURE:   After the risks benefits and alternatives of the procedure were thoroughly explained, informed consent was obtained.  Digital rectal exam was performed and revealed no abnormalities.   The LB [id number]-[id number] endoscope was introduced through the anus and advanced to the cecum, which was identified by both the appendix and ileocecal valve, without limitations.  The quality of the prep was excellent, using MoviPrep.  The instrument was then slowly withdrawn as the colon was fully examined. <<PROCEDUREIMAGES>>  FINDINGS:  A diminutive polyp was found in the cecum. Polyp was snared without cautery. Retrieval was successful. Moderate diverticulosis was found in the left colon.  Scattered diverticula were found in the right colon.  This was otherwise a normal examination of the colon.   Retroflexed views in the rectum revealed no abnormalities.    The time to cecum = 3:25 minutes. The scope was then withdrawn in 12:50 minutes from the cecum and the procedure completed. COMPLICATIONS:  None ENDOSCOPIC IMPRESSION: 1) Diminutive polyp in the cecum - removed 2) Moderate diverticulosis in the left colon 3) Diverticula, scattered in the right colon 4) Otherwise normal examination - excellent prep 5) Personal history diminutive adenoma removal  03/2006  REPEAT EXAM:  In for Colonoscopy, pending biopsy results.  Iva Boop, MD, [facility]  CC:  The Patient  n. eSIGNED:   Iva Boop at 08/14/2011 09:59 AM  Rebecka Apley, [phone]

## 2011-08-15 ENCOUNTER — Telehealth: Payer: Self-pay

## 2011-08-15 NOTE — Telephone Encounter (Signed)
  Follow up Call-  Call back number 08/14/2011  Post procedure Call Back phone  # [phone]  Permission to leave phone message Yes     Patient questions:  Do you have a fever, pain , or abdominal swelling? no Pain Score  0 *  Have you tolerated food without any problems? no  Have you been able to return to your normal activities? yes  Do you have any questions about your discharge instructions: Diet   no Medications  no Follow up visit  no  Do you have questions or concerns about your Care? no  Actions: * If pain score is 4 or above: No action needed, pain <4.

## 2011-08-17 ENCOUNTER — Encounter: Payer: Self-pay | Admitting: Internal Medicine

## 2011-08-17 ENCOUNTER — Encounter: Payer: Self-pay | Admitting: Family Medicine

## 2011-08-17 ENCOUNTER — Ambulatory Visit (INDEPENDENT_AMBULATORY_CARE_PROVIDER_SITE_OTHER): Payer: Managed Care, Other (non HMO) | Admitting: Family Medicine

## 2011-08-17 VITALS — BP 122/76 | HR 91 | Temp 98.8°F | Wt 296.4 lb

## 2011-08-17 DIAGNOSIS — J029 Acute pharyngitis, unspecified: Secondary | ICD-10-CM

## 2011-08-17 MED ORDER — FLUOCINOLONE ACETONIDE 0.025 % EX OINT: TOPICAL_OINTMENT | Freq: Two times a day (BID) | CUTANEOUS | Status: DC

## 2011-08-17 MED ORDER — ERYTHROMYCIN BASE 500 MG PO TABS: 500.0000 mg | ORAL_TABLET | Freq: Two times a day (BID) | ORAL | Status: AC

## 2011-08-17 NOTE — Patient Instructions (Signed)

## 2011-08-17 NOTE — Progress Notes (Signed)
Quick Note:  Diminutive cecal adenoma Repeat colonoscopy about 07/2016 - 5 years approx ______

## 2011-08-17 NOTE — Progress Notes (Signed)
  Subjective:     [name] is a 56 y.o. female who presents for evaluation of sore throat. Associated symptoms include hoarseness, post nasal drip, productive cough, sore throat and low grade fever. Onset of symptoms was 2 weeks ago, and have been gradually worsening since that time. She is drinking plenty of fluids. She has not had a recent close exposure to someone with proven streptococcal pharyngitis.  The following portions of the patient's history were reviewed and updated as appropriate: allergies, current medications, past family history, past medical history, past social history, past surgical history and problem list.  Review of Systems Pertinent items are noted in HPI.    Objective:    BP 122/76  Pulse 91  Temp(Src) 98.8 F (37.1 C) (Oral)  Wt 296 lb 6.4 oz (134.446 kg)  SpO2 97% General appearance: alert, cooperative, appears stated age and no distress Ears: both canals dry, flakey, + fluid Nose: Nares normal. Septum midline. Mucosa normal. No drainage or sinus tenderness. Throat: lips, mucosa, and tongue normal; teeth and gums normal Neck: moderate anterior cervical adenopathy, supple, symmetrical, trachea midline and thyroid not enlarged, symmetric, no tenderness/mass/nodules Lungs: clear to auscultation bilaterally Heart: S1, S2 normal  Laboratory Strep test not done. Results:na.    Assessment:    Acute pharyngitis, likely  -.    Plan:    Patient placed on antibiotics. Use of OTC analgesics recommended as well as salt water gargles. Follow up as needed.

## 2011-08-20 NOTE — Assessment & Plan Note (Signed)
New.  Pt's ear sxs consistent w/ eczema.  Start DermOtic.  Reviewed supportive care and red flags that should prompt return.  Pt expressed understanding and is in agreement w/ plan.

## 2011-08-20 NOTE — Assessment & Plan Note (Signed)
Improved since starting meds at last visit.  Continue cymbalta and will follow closely.

## 2011-08-20 NOTE — Assessment & Plan Note (Signed)
New.  Check labs to r/o metabolic issue or vitamin deficiency.  Pt's sxs also consistent w/ impending viral illness.  Will follow.

## 2011-08-24 ENCOUNTER — Telehealth: Payer: Self-pay | Admitting: Family Medicine

## 2011-08-24 NOTE — Telephone Encounter (Signed)
.  left message to have patient return my call.  

## 2011-08-24 NOTE — Telephone Encounter (Signed)
Pt had normal fasting glucose in April 2012 which is good indication that she's not diabetic.  Has not had recent fasting labs.

## 2011-08-24 NOTE — Telephone Encounter (Signed)
Did not see this test please advise

## 2011-08-24 NOTE — Telephone Encounter (Signed)
Patient called & wanted to know when she was last tested for Diabetes? Please review & Advise Patient Ph# [phone]

## 2011-08-28 NOTE — Telephone Encounter (Signed)
Pt returned call, advised that her glucose was checked in April 2012 and noted as normal, pt asked if her cholesterol had been checked, advised that this was checked 09-21-11 with no concern noted, per total 188, pt understood, pt noted that she still has no energy but will continue to take the iron supplement that she just started and if no changes occur and she still notes fatigue she will call us back to schedule an apt.

## 2011-08-28 NOTE — Telephone Encounter (Signed)
Noted  

## 2011-08-28 NOTE — Telephone Encounter (Signed)
Pt returned call

## 2011-09-04 ENCOUNTER — Ambulatory Visit (INDEPENDENT_AMBULATORY_CARE_PROVIDER_SITE_OTHER): Payer: Managed Care, Other (non HMO) | Admitting: Family Medicine

## 2011-09-04 ENCOUNTER — Telehealth: Payer: Self-pay | Admitting: *Deleted

## 2011-09-04 ENCOUNTER — Encounter: Payer: Self-pay | Admitting: Family Medicine

## 2011-09-04 VITALS — BP 124/80 | HR 105 | Temp 99.8°F | Ht 67.5 in | Wt 292.2 lb

## 2011-09-04 DIAGNOSIS — R1110 Vomiting, unspecified: Secondary | ICD-10-CM

## 2011-09-04 DIAGNOSIS — R197 Diarrhea, unspecified: Secondary | ICD-10-CM | POA: Insufficient documentation

## 2011-09-04 MED ORDER — DIPHENOXYLATE-ATROPINE 2.5-0.025 MG PO TABS: 1.0000 | ORAL_TABLET | Freq: Four times a day (QID) | ORAL | Status: AC | PRN

## 2011-09-04 MED ORDER — PROMETHAZINE HCL 25 MG PO TABS: 25.0000 mg | ORAL_TABLET | Freq: Three times a day (TID) | ORAL | Status: DC | PRN

## 2011-09-04 NOTE — Telephone Encounter (Signed)
[facility] Triage Call Report Triage Record Num: [id number] Operator: Caswell Corwin Patient Name: [name] Call Date & Time: 09/04/2011 1:34:52PM Patient Phone: [phone] PCP: Neena Rhymes ([facility]) Patient Gender: Female PCP Fax : Patient DOB: July 30, 1955 Practice Name: [facility] Reason for Call: Caller: [name]/Patient; PCP: Sheliah Hatch.; CB#: [phone]; Call regarding Vomiting and Diarrhea that started 09/03/11 afternoon. Now the vomiting is worse. Since 0600 has vomited x 2 and had diarrhea x 4. Temp now is 100.6 O at 1130. Triaged N&V and last voided at 1000. Has not been able to keep any fuilds down for the past 8 hrs. Disp = needs to be seen in 4 hrs. Called the ofc and no appts available. Appt made for 1615 this afternoon with Tabori. Home care inst given. Protocol(s) Used: Nausea or Vomiting Recommended Outcome per Protocol: See Provider within 4 hours Reason for Outcome: Continuous or repeated vomiting for more than 8 hours AND unable to keep any fluids down Care Advice:

## 2011-09-04 NOTE — Assessment & Plan Note (Signed)
New.  Pt's sxs consistent w/ viral illness.  Phenergan, lomotil, increased fluids.  Reviewed supportive care and red flags that should prompt return.  Pt expressed understanding and is in agreement w/ plan.

## 2011-09-04 NOTE — Patient Instructions (Signed)
This is the GI bug that's going around Use the phenergan as needed for nausea Lomotil for diarrhea Drink plenty of fluids REST! Hang in there!!!

## 2011-09-04 NOTE — Progress Notes (Signed)
  Subjective:    Patient ID: Jolaine Artist, female    DOB: [birth year], 56 y.o.   MRN: [id number]  HPI Vomiting/diarrhea- sxs started yesterday w/ diarrhea.  Then began vomiting.  Still having diarrhea despite immodium.  + fever- Tm 101.  No known sick contacts.  Diffuse cramping but no focal pain. no recent camping, foreign travel, exotic foods.    Review of Systems For ROS see HPI     Objective:   Physical Exam  Vitals reviewed. Constitutional: She is oriented to person, place, and time. She appears well-developed and well-nourished. No distress.  HENT:  Head: Normocephalic and atraumatic.       MMM  Neck: Neck supple.  Cardiovascular: Normal rate, regular rhythm and intact distal pulses.   Pulmonary/Chest: Effort normal and breath sounds normal. No respiratory distress. She has no wheezes. She has no rales.  Abdominal: Soft. She exhibits no distension. There is no tenderness. There is no rebound.       Hyperactive BS  Lymphadenopathy:    She has no cervical adenopathy.  Neurological: She is alert and oriented to person, place, and time.  Skin: Skin is warm and dry.          Assessment & Plan:

## 2011-09-04 NOTE — Telephone Encounter (Signed)
Pt seen in office today.

## 2011-12-06 ENCOUNTER — Encounter: Payer: Self-pay | Admitting: Family Medicine

## 2011-12-06 ENCOUNTER — Ambulatory Visit (INDEPENDENT_AMBULATORY_CARE_PROVIDER_SITE_OTHER): Payer: Managed Care, Other (non HMO) | Admitting: Family Medicine

## 2011-12-06 VITALS — BP 125/80 | HR 100 | Temp 98.9°F | Ht 66.75 in | Wt 308.2 lb

## 2011-12-06 DIAGNOSIS — R609 Edema, unspecified: Secondary | ICD-10-CM

## 2011-12-06 DIAGNOSIS — R0602 Shortness of breath: Secondary | ICD-10-CM

## 2011-12-06 DIAGNOSIS — R0989 Other specified symptoms and signs involving the circulatory and respiratory systems: Secondary | ICD-10-CM

## 2011-12-06 DIAGNOSIS — R5383 Other fatigue: Secondary | ICD-10-CM

## 2011-12-06 DIAGNOSIS — R0609 Other forms of dyspnea: Secondary | ICD-10-CM

## 2011-12-06 DIAGNOSIS — R0683 Snoring: Secondary | ICD-10-CM | POA: Insufficient documentation

## 2011-12-06 DIAGNOSIS — R5381 Other malaise: Secondary | ICD-10-CM

## 2011-12-06 LAB — BASIC METABOLIC PANEL
BUN: 15 mg/dL (ref 6–23)
CO2: 32 mEq/L (ref 19–32)
Calcium: 9.3 mg/dL (ref 8.4–10.5)
Chloride: 97 mEq/L (ref 96–112)
Creatinine, Ser: 1 mg/dL (ref 0.4–1.2)
GFR: 62.83 mL/min (ref >60.00)
Glucose, Bld: 97 mg/dL (ref 70–99)
Potassium: 3.5 mEq/L (ref 3.5–5.1)
Sodium: 138 mEq/L (ref 135–145)

## 2011-12-06 LAB — CBC WITH DIFFERENTIAL/PLATELET
Basophils Absolute: 0.4 10*3/uL — ABNORMAL HIGH (ref 0.0–0.1)
Basophils Relative: 4.4 % — ABNORMAL HIGH (ref 0.0–3.0)
Eosinophils Absolute: 0.1 10*3/uL (ref 0.0–0.7)
Eosinophils Relative: 1.4 % (ref 0.0–5.0)
HCT: 35.2 % — ABNORMAL LOW (ref 36.0–46.0)
Hemoglobin: 11.4 g/dL — ABNORMAL LOW (ref 12.0–15.0)
Lymphocytes Relative: 32 % (ref 12.0–46.0)
Lymphs Abs: 2.8 10*3/uL (ref 0.7–4.0)
MCHC: 32.4 g/dL (ref 30.0–36.0)
MCV: 82.4 fl (ref 78.0–100.0)
Monocytes Absolute: 0.9 10*3/uL (ref 0.1–1.0)
Monocytes Relative: 10.1 % (ref 3.0–12.0)
Neutro Abs: 4.6 10*3/uL (ref 1.4–7.7)
Neutrophils Relative %: 52.1 % (ref 43.0–77.0)
Platelets: 285 10*3/uL (ref 150.0–400.0)
RBC: 4.26 Mil/uL (ref 3.87–5.11)
RDW: 16.4 % — ABNORMAL HIGH (ref 11.5–14.6)
WBC: 8.8 10*3/uL (ref 4.5–10.5)

## 2011-12-06 LAB — HEPATIC FUNCTION PANEL
ALT: 40 U/L — ABNORMAL HIGH (ref 0–35)
AST: 39 U/L — ABNORMAL HIGH (ref 0–37)
Albumin: 3.7 g/dL (ref 3.5–5.2)
Alkaline Phosphatase: 75 U/L (ref 39–117)
Bilirubin, Direct: 0 mg/dL (ref 0.0–0.3)
Total Bilirubin: 0.3 mg/dL (ref 0.3–1.2)
Total Protein: 7.3 g/dL (ref 6.0–8.3)

## 2011-12-06 LAB — HEMOGLOBIN A1C: Hgb A1c MFr Bld: 6.9 % — ABNORMAL HIGH (ref 4.6–6.5)

## 2011-12-06 LAB — BRAIN NATRIURETIC PEPTIDE: Pro B Natriuretic peptide (BNP): 10 pg/mL (ref 0.0–100.0)

## 2011-12-06 LAB — TSH: TSH: 1.14 u[IU]/mL (ref 0.35–5.50)

## 2011-12-06 NOTE — Assessment & Plan Note (Signed)
Deteriorated.  Now no longer improves w/ lasix and elevation.  Check ECHO, BNP to r/o CHF.  Continue lasix until results available and management can be tailored to fit.  Pt expressed understanding and is in agreement w/ plan.

## 2011-12-06 NOTE — Assessment & Plan Note (Signed)
New.  This in combo w/ edema is concerning for possible CHF but pt is also obese and very deconditioned.  If pt's ECHO and BNP are normal will discuss exercise plan to improve endurance.

## 2011-12-06 NOTE — Patient Instructions (Addendum)
We'll call you with your ECHO appt, pulmonary appt, lab results Continue the lasix for now Try and get regular exercise to improve your air movement Call with any questions or concerns Hang in there!!!

## 2011-12-06 NOTE — Assessment & Plan Note (Signed)
New.  Given pt's obesity and loud snoring must consider sleep apnea.  Will refer to pulm.

## 2011-12-06 NOTE — Assessment & Plan Note (Signed)
Chronic problem for pt.  Given body habitus and family hx will check labs to r/o thyroid d/o, diabetes.  Will also refer back to pulm to assess for possible sleep apnea given hx of loud snoring.

## 2011-12-06 NOTE — Progress Notes (Signed)
  Subjective:    Patient ID: [name], female    DOB: Aug 06, 1955, 56 y.o.   MRN: [id number]  HPI Edema- most noticeable in feet and legs but pt reports feeling 'swollen all over' for last few weeks.  + swelling of hands, face.  LE edema is no longer improving w/ elevation and lasix.  + SOB w/ walking from car to bank.  Now experiencing sensation of not being able to take a deep breath while sleeping.  Will need to sit up on edge of bed to get breathing 'under control'.  + fatigue.  Hx of 'loud snoring'.  No witnessed apneic events but pt sleeps alone.  + family hx of diabetes.  Pt is not exercising.  Remains obese.     Review of Systems For ROS see HPI     Objective:   Physical Exam  Vitals reviewed. Constitutional: She is oriented to person, place, and time. She appears well-developed and well-nourished. No distress.       obese  HENT:  Head: Normocephalic and atraumatic.  Eyes: Conjunctivae and EOM are normal. Pupils are equal, round, and reactive to light.  Neck: Normal range of motion. Neck supple. No thyromegaly present.  Cardiovascular: Normal rate, regular rhythm, normal heart sounds and intact distal pulses.   No murmur heard. Pulmonary/Chest: Effort normal and breath sounds normal. No respiratory distress.  Abdominal: Soft. She exhibits no distension. There is no tenderness.  Musculoskeletal: She exhibits edema (nonpitting edema of feet and ankles bilaterally.  no obvious swelling of face.  + indentations at site of rings bilaterally).  Lymphadenopathy:    She has no cervical adenopathy.  Neurological: She is alert and oriented to person, place, and time.  Skin: Skin is warm and dry.  Psychiatric: She has a normal mood and affect. Her behavior is normal.          Assessment & Plan:

## 2011-12-07 ENCOUNTER — Encounter: Payer: Self-pay | Admitting: *Deleted

## 2011-12-07 MED ORDER — METFORMIN HCL 500 MG PO TABS: 500.0000 mg | ORAL_TABLET | Freq: Two times a day (BID) | ORAL | Status: DC

## 2011-12-07 NOTE — Addendum Note (Signed)
Addended by: Derry Lory A on: 12/07/2011 02:25 PM   Modules accepted: Orders

## 2011-12-08 ENCOUNTER — Ambulatory Visit (INDEPENDENT_AMBULATORY_CARE_PROVIDER_SITE_OTHER): Payer: Managed Care, Other (non HMO) | Admitting: Family Medicine

## 2011-12-08 ENCOUNTER — Encounter: Payer: Self-pay | Admitting: Family Medicine

## 2011-12-08 VITALS — BP 130/75 | HR 100 | Temp 98.6°F | Ht 67.75 in | Wt 311.0 lb

## 2011-12-08 DIAGNOSIS — E119 Type 2 diabetes mellitus without complications: Secondary | ICD-10-CM

## 2011-12-08 MED ORDER — TELMISARTAN-HCTZ 80-25 MG PO TABS: 1.0000 | ORAL_TABLET | Freq: Every day | ORAL | Status: DC

## 2011-12-08 NOTE — Patient Instructions (Addendum)
Follow up in 3 months We'll call you with your nutrition appt Do 1/2 tab of metformin twice daily Limit your carb intake- sugars, breads, pasta, rice, etc You can totally do this! Call with any questions or concerns DO NOT FREAK OUT!

## 2011-12-10 DIAGNOSIS — E669 Obesity, unspecified: Secondary | ICD-10-CM

## 2011-12-10 DIAGNOSIS — E1169 Type 2 diabetes mellitus with other specified complication: Secondary | ICD-10-CM

## 2011-12-10 DIAGNOSIS — E119 Type 2 diabetes mellitus without complications: Secondary | ICD-10-CM | POA: Insufficient documentation

## 2011-12-10 HISTORY — DX: Type 2 diabetes mellitus with other specified complication: E66.9

## 2011-12-10 HISTORY — DX: Type 2 diabetes mellitus with other specified complication: E11.69

## 2011-12-10 NOTE — Assessment & Plan Note (Signed)
New.  Decrease metformin to 1/2 tab bid due to GI issues and symptomatic relative hypoglycemia.  Pt UTD on eye exam, on ARB.  Will refer to nutrition.  No need for home CBG monitoring at this time.  Discussed importance of low carb diet and regular exercise.  Will follow closely.

## 2011-12-10 NOTE — Progress Notes (Signed)
  Subjective:    Patient ID: [name], female    DOB: 1955/07/13, 56 y.o.   MRN: [id number]  HPI DM- new dx for pt.  Here to discuss.  Started Metformin last night.  Some dizziness and loose stool.  Has some understanding of dz due to family hx.  Already gets yearly eye exams, on ARB.     Review of Systems Denies CP, HAs, visual changes.  + PND, edema.    Objective:   Physical Exam  Vitals reviewed. Constitutional: She appears well-developed and well-nourished. No distress.       Obese  Psychiatric:       Anxious but appropriately so          Assessment & Plan:

## 2011-12-11 ENCOUNTER — Ambulatory Visit (HOSPITAL_COMMUNITY): Payer: Managed Care, Other (non HMO) | Attending: Cardiology

## 2011-12-11 DIAGNOSIS — R0602 Shortness of breath: Secondary | ICD-10-CM

## 2011-12-11 DIAGNOSIS — R5381 Other malaise: Secondary | ICD-10-CM | POA: Insufficient documentation

## 2011-12-11 DIAGNOSIS — R0609 Other forms of dyspnea: Secondary | ICD-10-CM | POA: Insufficient documentation

## 2011-12-11 DIAGNOSIS — R609 Edema, unspecified: Secondary | ICD-10-CM

## 2011-12-11 DIAGNOSIS — R0989 Other specified symptoms and signs involving the circulatory and respiratory systems: Secondary | ICD-10-CM | POA: Insufficient documentation

## 2011-12-11 NOTE — Progress Notes (Signed)
Echocardiogram performed.  

## 2011-12-28 ENCOUNTER — Ambulatory Visit: Payer: Managed Care, Other (non HMO) | Admitting: Critical Care Medicine

## 2012-01-01 ENCOUNTER — Encounter: Payer: Self-pay | Admitting: *Deleted

## 2012-01-01 ENCOUNTER — Encounter: Payer: Managed Care, Other (non HMO) | Attending: Family Medicine | Admitting: *Deleted

## 2012-01-01 VITALS — Ht 67.0 in | Wt 309.3 lb

## 2012-01-01 DIAGNOSIS — E119 Type 2 diabetes mellitus without complications: Secondary | ICD-10-CM | POA: Insufficient documentation

## 2012-01-01 DIAGNOSIS — Z713 Dietary counseling and surveillance: Secondary | ICD-10-CM | POA: Insufficient documentation

## 2012-01-01 DIAGNOSIS — E669 Obesity, unspecified: Secondary | ICD-10-CM | POA: Insufficient documentation

## 2012-01-01 NOTE — Patient Instructions (Addendum)
Plan: Aim for 3 Carb choices (45 grams) per meal +/- 1 either way Aim for 0-2 Carb per snack if hungry Start reading food labels for total carb of foods Start increasing activity level by swimming about 15 minutes per day as able  :-)

## 2012-01-01 NOTE — Progress Notes (Signed)
  Medical Nutrition Therapy:  Appt start time: 1030 end time:  1130.  Assessment:  Primary concerns today: patient here for diabetes and obesity. 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 Her husband passed away 11 years ago so she has been on her own since then. Her daughter is engaged to be married and she is paying for most of the wedding which adds a lot of stress to her life. The daughter, future husband and grandson currently are living with her now.   MEDICATIONS: see list. Current diabetes medication is Metformin   DIETARY INTAKE:  Usual eating pattern includes 2-3 meals and 2-3 snacks per day.  Everyday foods include variety of all food groups.  Avoided foods include: none stated.    24-hr recall:  B ( AM): [employer] ham biscuit and large sweet tea  Snk ( AM): fresh fruit occasionally  L ( PM): bring from home: sandwich OR left overs, water or diet soda Snk ( PM): PNB crackers or fruit D ( PM): if working - cereal or ice cream, if home: cook lean meat, vegetables, starch, sweet tea Snk ( PM): fresh fruit or ice cream x 3 scoops with milk added  Beverages: sweet tea, diet soda or water  Usual physical activity: none currently. 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  Estimated energy needs: 1500 calories 170 g carbohydrates 112 g protein 42 g fat  Progress Towards Goal(s):  In progress.   Nutritional Diagnosis:  NI-1.5 Excessive energy intake As related to current activity level.  As evidenced by BMI of 48.5.    Intervention:  Nutrition counseling and diabetes education initiated. Spent initial part of visit discussing the stresses she experiences with her daughter's upcoming wedding and having so many people living in her home right now.  Then discussed basic physiology of diabetes,  A1c, Carb  Counting and reading food labels, and benefits of increased activity. Encouraged her to use the pool at her work as a good form of exercise especially with her back and knee problems. Plan to discuss Fat and rationale of SMBG in more detail at next visit Plan: Aim for 3 Carb choices (45 grams) per meal +/- 1 either way Aim for 0-2 Carb per snack if hungry Start reading food labels for total carb of foods Start increasing activity level by swimming about 15 minutes per day as able  :-)  Handouts given during visit include: Living Well with Diabetes Carb Counting and Food Label handouts Meal Plan Card  Bariatric Information Session info sheet  App for Calorie King  Monitoring/Evaluation:  Dietary intake, exercise in pool, reading food labels, and body weight in 4 week(s).

## 2012-01-17 ENCOUNTER — Ambulatory Visit: Payer: Managed Care, Other (non HMO) | Admitting: Critical Care Medicine

## 2012-01-23 ENCOUNTER — Telehealth: Payer: Self-pay | Admitting: *Deleted

## 2012-01-23 NOTE — Telephone Encounter (Signed)
Can hold Metformin in the AM, continue in the evening and see if sxs improve

## 2012-01-23 NOTE — Telephone Encounter (Signed)
Great!  Let's see how she does taking it at night only

## 2012-01-23 NOTE — Telephone Encounter (Signed)
Caller: Delesha/Patient; Patient Name: [name]; PCP: Sheliah Hatch.; Best Callback Phone Number: [phone]; Reason for call: Other Pt has been taking Metformin x 6 weeks. Pt takes 500mg  first thing in the am. Pt finds at lunch she is nauseated and then dizzy after lunch which lasts into the evening at 6pm. This started 1 week ago.  Pt states she was so dizzy at one point last week she thought she was going to faint. Pt does not have a glucometer or check her blood sugar at all. However a coworker did have a glucometer and checked her blood sugar was 128 during the middle of a dizzy spell.  Pt is calling for advice. Pt does hydrate herself throughout the day and does eat protein and carbohydrates at each meal. Pt did miss taking the pill on Saturday and noticed she did not have any of these feelings at all that whole day. OFFICE PLEASE CALL PT BACK.

## 2012-01-23 NOTE — Telephone Encounter (Signed)
Pt notes she did not take it this am and has felt better, no dizziness noted

## 2012-01-23 NOTE — Telephone Encounter (Signed)
confidential Office Message [address] [place], [place] [address] p. [phone] f. [phone] To: [place] Fax: [phone] From: [facility] Date/ Time: 01/22/2012 7:37 PM Taken By: Albertine Grates, RN Caller: Loana Facility: Not Collected Patient: [name], [name] DOB: 04-28-1956 Phone: [phone] Reason for Call: Caller was unable to be reached on callback - Left Message Regarding Appointment: No Appt Date: Appt Time: Unknown Provider: Reason: Details: Outcome:

## 2012-02-08 ENCOUNTER — Encounter: Payer: Self-pay | Admitting: *Deleted

## 2012-02-08 ENCOUNTER — Encounter: Payer: Managed Care, Other (non HMO) | Attending: Family Medicine | Admitting: *Deleted

## 2012-02-08 ENCOUNTER — Ambulatory Visit (INDEPENDENT_AMBULATORY_CARE_PROVIDER_SITE_OTHER): Payer: Managed Care, Other (non HMO) | Admitting: Family Medicine

## 2012-02-08 ENCOUNTER — Encounter: Payer: Self-pay | Admitting: Family Medicine

## 2012-02-08 VITALS — BP 123/80 | HR 100 | Temp 98.9°F | Ht 66.25 in | Wt 300.0 lb

## 2012-02-08 VITALS — Ht 67.0 in | Wt 296.9 lb

## 2012-02-08 DIAGNOSIS — Z713 Dietary counseling and surveillance: Secondary | ICD-10-CM | POA: Insufficient documentation

## 2012-02-08 DIAGNOSIS — J209 Acute bronchitis, unspecified: Secondary | ICD-10-CM

## 2012-02-08 DIAGNOSIS — E119 Type 2 diabetes mellitus without complications: Secondary | ICD-10-CM

## 2012-02-08 DIAGNOSIS — E669 Obesity, unspecified: Secondary | ICD-10-CM

## 2012-02-08 MED ORDER — GUAIFENESIN-CODEINE 100-10 MG/5ML PO SYRP: 10.0000 mL | ORAL_SOLUTION | Freq: Three times a day (TID) | ORAL | Status: DC | PRN

## 2012-02-08 MED ORDER — CLARITHROMYCIN ER 500 MG PO TB24: 1000.0000 mg | ORAL_TABLET | Freq: Every day | ORAL | Status: DC

## 2012-02-08 NOTE — Progress Notes (Signed)
  Subjective:    Patient ID: [name], female    DOB: 22-Apr-1956, 56 y.o.   MRN: [id number]  HPI URI- sxs started Friday evening, took mucinex and delsym w/out relief.  Sister gave her 7 augmentin, an inhaler, and hydrocodone cough syrup.  'everything's gone but the inhaler and i still feel like crap'.  No facial pain/pressure, nasal congestion.  + chest tightness, cough is intermittently productive, + post-tussive emesis.  +SOB, wheezing at night.  No fevers.  + lightheaded w/ deep cough.  Still having L ear discomfort.   Review of Systems For ROS see HPI     Objective:   Physical Exam  Vitals reviewed. Constitutional: She appears well-developed and well-nourished. No distress.  HENT:  Head: Normocephalic and atraumatic.       TMs normal bilaterally Mild nasal congestion Throat w/out erythema, edema, or exudate  Eyes: Conjunctivae normal and EOM are normal. Pupils are equal, round, and reactive to light.  Neck: Normal range of motion. Neck supple.  Cardiovascular: Normal rate, regular rhythm, normal heart sounds and intact distal pulses.   No murmur heard. Pulmonary/Chest: Effort normal and breath sounds normal. No respiratory distress. She has no wheezes.       + hacking cough  Lymphadenopathy:    She has no cervical adenopathy.          Assessment & Plan:

## 2012-02-08 NOTE — Assessment & Plan Note (Signed)
New.  No improvement w/ 3 days of augmentin.  Will switch to macrolide to cover possible atypical.  Cough syrup prn.  Continue inhaler prn.  Reviewed supportive care and red flags that should prompt return.  Pt expressed understanding and is in agreement w/ plan.

## 2012-02-08 NOTE — Progress Notes (Signed)
  Medical Nutrition Therapy:  Appt start time: 1700 end time:  1730.  Assessment:  Primary concerns today: patient here for diabetes and obesity follow up visit.  Weight loss of 13 pounds in past 6 weeks noted! 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 She is also walking at work about 2 miles each day as part of her work habits. She has also made major changes in her eating habits doing well with carb counting. She seems less on edge today and states she is more comfortable with the wedding plans of her daughter coming together now.  MEDICATIONS: see list. Current diabetes medication is Metformin that has been moved to night time   DIETARY INTAKE:  Usual eating pattern includes 2-3 meals and 2-3 snacks per day.  Everyday foods include variety of all food groups.  Avoided foods include: none stated.    24-hr recall:  B ( AM): [employer] ham biscuit and large unsweet tea with Splenda now Snk ( AM): fresh fruit occasionally  L ( PM): bring from home: sandwich OR left overs, water or diet soda Snk ( PM): PNB crackers or fruit D ( PM): if working - cereal  if home: cook lean meat, vegetables, starch, unsweet tea with splenda Snk ( PM): fresh fruit, no more  ice cream! 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  Estimated energy needs: 1500 calories 170 g carbohydrates 112 g protein 42 g fat  Progress Towards Goal(s):  In progress.   Nutritional Diagnosis:  NI-1.5 Excessive energy intake As related to current activity level.  As evidenced by BMI of 48.5, now reduced to 46.6    Intervention:  Commended her on her many successes. Encouraged her to continue with carb counting and her swimming at pool. She states her next MD appointment is in October, when she will have her next A1c test done.  Plan to discuss Fat and rationale of SMBG in more detail at next  visit  Plan: Continue to aim for 3 Carb choices (45 grams) per meal +/- 1 either way Aim for 0-2 Carb per snack if hungry Continue reading food labels for total carb of foods Continue swimming about 15 - 30 minutes per day as able  :-)  Monitoring/Evaluation:  Dietary intake, exercise in pool, reading food labels, and body weight PRN. Patient to call for follow up as needed.

## 2012-02-08 NOTE — Patient Instructions (Addendum)
Start the Biaxin- 2 tabs at the same time for 10 days.  Take w/ food Continue the Proair inhaler- 2 puffs every 4 hrs as needed for cough, wheezing or shortness of breath Use the codeine cough syrup as needed Call w/ any questions or concerns Hang in there!!!

## 2012-02-12 NOTE — Telephone Encounter (Signed)
Pt was in last week and this matter was taken care of.

## 2012-03-11 ENCOUNTER — Ambulatory Visit (INDEPENDENT_AMBULATORY_CARE_PROVIDER_SITE_OTHER): Payer: Managed Care, Other (non HMO) | Admitting: Family Medicine

## 2012-03-11 ENCOUNTER — Encounter: Payer: Self-pay | Admitting: Family Medicine

## 2012-03-11 VITALS — BP 128/84 | HR 98 | Temp 98.8°F | Ht 66.0 in | Wt 302.0 lb

## 2012-03-11 DIAGNOSIS — R0602 Shortness of breath: Secondary | ICD-10-CM

## 2012-03-11 DIAGNOSIS — R0609 Other forms of dyspnea: Secondary | ICD-10-CM

## 2012-03-11 DIAGNOSIS — E119 Type 2 diabetes mellitus without complications: Secondary | ICD-10-CM

## 2012-03-11 DIAGNOSIS — R0683 Snoring: Secondary | ICD-10-CM

## 2012-03-11 DIAGNOSIS — R0989 Other specified symptoms and signs involving the circulatory and respiratory systems: Secondary | ICD-10-CM

## 2012-03-11 NOTE — Assessment & Plan Note (Signed)
Pt has lost 10 lbs since last visit.  Following low carb diet, has started exercising.  Taking Metformin nightly.  Check labs.  Adjust meds prn

## 2012-03-11 NOTE — Assessment & Plan Note (Signed)
Pt continues to have intermittent nocturnal episodes.  Will re-refer to pulm for complete evaluation.

## 2012-03-11 NOTE — Assessment & Plan Note (Signed)
Refer to pulm to r/o sleep apnea 

## 2012-03-11 NOTE — Progress Notes (Signed)
  Subjective:    Patient ID: [name], female    DOB: 25-Mar-1956, 56 y.o.   MRN: [id number]  HPI DM- pt is taking 1 tab of metformin nightly.  Reports feeling well.  No longer having symptomatic lows.  Has lost 10 lbs.  Is very excited by this.  Reports feeling well.  No CP, SOB, HAs, visual changes, edema.  Snoring, SOB- pt felt sxs had improved so cancelled pulm appt.  Last week had repeat episode that woke her from sleep, felt that air wasn't moving- in or out- very short of breath.  Scared her.  Would like to see specialist now.   Review of Systems For ROS see HPI     Objective:   Physical Exam  Vitals reviewed. Constitutional: She is oriented to person, place, and time. She appears well-developed and well-nourished. No distress.  HENT:  Head: Normocephalic and atraumatic.  Eyes: Conjunctivae normal and EOM are normal. Pupils are equal, round, and reactive to light.  Neck: Normal range of motion. Neck supple. No thyromegaly present.  Cardiovascular: Normal rate, regular rhythm, normal heart sounds and intact distal pulses.   No murmur heard. Pulmonary/Chest: Effort normal and breath sounds normal. No respiratory distress.  Abdominal: Soft. She exhibits no distension. There is no tenderness.  Musculoskeletal: She exhibits no edema.  Lymphadenopathy:    She has no cervical adenopathy.  Neurological: She is alert and oriented to person, place, and time.  Skin: Skin is warm and dry.  Psychiatric: She has a normal mood and affect. Her behavior is normal.          Assessment & Plan:

## 2012-03-11 NOTE — Patient Instructions (Addendum)
Follow up in 3 months to recheck sugar Keep up the good work!  You look great!! Call with any questions or concerns Happy Fall!!!

## 2012-03-12 ENCOUNTER — Encounter (HOSPITAL_BASED_OUTPATIENT_CLINIC_OR_DEPARTMENT_OTHER): Payer: Self-pay | Admitting: *Deleted

## 2012-03-12 ENCOUNTER — Emergency Department (HOSPITAL_BASED_OUTPATIENT_CLINIC_OR_DEPARTMENT_OTHER)
Admission: EM | Admit: 2012-03-12 | Discharge: 2012-03-13 | Disposition: A | Discharge status: Home / Self Care | Payer: Managed Care, Other (non HMO) | Attending: Emergency Medicine | Admitting: Emergency Medicine

## 2012-03-12 ENCOUNTER — Emergency Department (HOSPITAL_BASED_OUTPATIENT_CLINIC_OR_DEPARTMENT_OTHER): Payer: Managed Care, Other (non HMO)

## 2012-03-12 DIAGNOSIS — Z90710 Acquired absence of both cervix and uterus: Secondary | ICD-10-CM | POA: Insufficient documentation

## 2012-03-12 DIAGNOSIS — Z79899 Other long term (current) drug therapy: Secondary | ICD-10-CM | POA: Insufficient documentation

## 2012-03-12 DIAGNOSIS — R1032 Left lower quadrant pain: Secondary | ICD-10-CM | POA: Insufficient documentation

## 2012-03-12 DIAGNOSIS — K5792 Diverticulitis of intestine, part unspecified, without perforation or abscess without bleeding: Secondary | ICD-10-CM

## 2012-03-12 DIAGNOSIS — R160 Hepatomegaly, not elsewhere classified: Secondary | ICD-10-CM | POA: Insufficient documentation

## 2012-03-12 DIAGNOSIS — E119 Type 2 diabetes mellitus without complications: Secondary | ICD-10-CM | POA: Insufficient documentation

## 2012-03-12 DIAGNOSIS — I10 Essential (primary) hypertension: Secondary | ICD-10-CM | POA: Insufficient documentation

## 2012-03-12 DIAGNOSIS — K5732 Diverticulitis of large intestine without perforation or abscess without bleeding: Secondary | ICD-10-CM | POA: Insufficient documentation

## 2012-03-12 HISTORY — DX: Obesity, unspecified: E66.9

## 2012-03-12 LAB — URINALYSIS, ROUTINE W REFLEX MICROSCOPIC
Bilirubin Urine: NEGATIVE
Glucose, UA: NEGATIVE mg/dL
Ketones, ur: NEGATIVE mg/dL
Leukocytes, UA: NEGATIVE
Nitrite: NEGATIVE
Protein, ur: NEGATIVE mg/dL
Specific Gravity, Urine: 1.009 (ref 1.005–1.030)
Urobilinogen, UA: 0.2 mg/dL (ref 0.0–1.0)
pH: 6 (ref 5.0–8.0)

## 2012-03-12 LAB — BASIC METABOLIC PANEL
BUN: 16 mg/dL (ref 6–23)
BUN: 11 mg/dL (ref 6–23)
CO2: 28 mEq/L (ref 19–32)
CO2: 30 mEq/L (ref 19–32)
Calcium: 8.9 mg/dL (ref 8.4–10.5)
Calcium: 9.7 mg/dL (ref 8.4–10.5)
Chloride: 98 mEq/L (ref 96–112)
Chloride: 97 mEq/L (ref 96–112)
Creatinine, Ser: 1 mg/dL (ref 0.4–1.2)
Creatinine, Ser: 0.8 mg/dL (ref 0.50–1.10)
GFR calc Af Amer: >90 mL/min (ref >90)
GFR calc non Af Amer: 80 mL/min — ABNORMAL LOW (ref >90)
GFR: 61.31 mL/min (ref >60.00)
Glucose, Bld: 88 mg/dL (ref 70–99)
Glucose, Bld: 115 mg/dL — ABNORMAL HIGH (ref 70–99)
Potassium: 3.7 mEq/L (ref 3.5–5.1)
Potassium: 3.4 mEq/L — ABNORMAL LOW (ref 3.5–5.1)
Sodium: 136 mEq/L (ref 135–145)
Sodium: 138 mEq/L (ref 135–145)

## 2012-03-12 LAB — CBC WITH DIFFERENTIAL/PLATELET
Basophils Absolute: 0 10*3/uL (ref 0.0–0.1)
Basophils Relative: 0 % (ref 0–1)
Eosinophils Absolute: 0.1 10*3/uL (ref 0.0–0.7)
Eosinophils Relative: 1 % (ref 0–5)
HCT: 35.7 % — ABNORMAL LOW (ref 36.0–46.0)
Hemoglobin: 11.5 g/dL — ABNORMAL LOW (ref 12.0–15.0)
Lymphocytes Relative: 31 % (ref 12–46)
Lymphs Abs: 3.6 10*3/uL (ref 0.7–4.0)
MCH: 26.6 pg (ref 26.0–34.0)
MCHC: 32.2 g/dL (ref 30.0–36.0)
MCV: 82.6 fL (ref 78.0–100.0)
Monocytes Absolute: 1.1 10*3/uL — ABNORMAL HIGH (ref 0.1–1.0)
Monocytes Relative: 9 % (ref 3–12)
Neutro Abs: 6.8 10*3/uL (ref 1.7–7.7)
Neutrophils Relative %: 59 % (ref 43–77)
Platelets: 276 10*3/uL (ref 150–400)
RBC: 4.32 MIL/uL (ref 3.87–5.11)
RDW: 15.9 % — ABNORMAL HIGH (ref 11.5–15.5)
WBC: 11.6 10*3/uL — ABNORMAL HIGH (ref 4.0–10.5)

## 2012-03-12 LAB — URINE MICROSCOPIC-ADD ON

## 2012-03-12 LAB — HEMOGLOBIN A1C: Hgb A1c MFr Bld: 6.7 % — ABNORMAL HIGH (ref 4.6–6.5)

## 2012-03-12 MED ORDER — OXYCODONE-ACETAMINOPHEN 7.5-325 MG PO TABS: 1.0000 | ORAL_TABLET | ORAL | Status: DC | PRN

## 2012-03-12 MED ORDER — CIPROFLOXACIN HCL 500 MG PO TABS: 500.0000 mg | ORAL_TABLET | Freq: Two times a day (BID) | ORAL | Status: DC

## 2012-03-12 MED ORDER — ONDANSETRON HCL 4 MG/2ML IJ SOLN
4.0000 mg | Freq: Once | INTRAMUSCULAR | Status: AC
  Administered 2012-03-12: 4 mg via INTRAVENOUS
  Filled 2012-03-12: qty 2

## 2012-03-12 MED ORDER — IOHEXOL 300 MG/ML  SOLN
100.0000 mL | Freq: Once | INTRAMUSCULAR | Status: AC | PRN
  Administered 2012-03-12: 100 mL via INTRAVENOUS

## 2012-03-12 MED ORDER — METRONIDAZOLE 500 MG PO TABS: 500.0000 mg | ORAL_TABLET | Freq: Two times a day (BID) | ORAL | Status: DC

## 2012-03-12 MED ORDER — FENTANYL CITRATE 0.05 MG/ML IJ SOLN
50.0000 ug | Freq: Once | INTRAMUSCULAR | Status: AC
  Administered 2012-03-13: 50 ug via INTRAVENOUS
  Filled 2012-03-12: qty 2

## 2012-03-12 MED ORDER — CIPROFLOXACIN IN D5W 400 MG/200ML IV SOLN
400.0000 mg | Freq: Once | INTRAVENOUS | Status: AC
  Administered 2012-03-13: 400 mg via INTRAVENOUS
  Filled 2012-03-12: qty 200

## 2012-03-12 MED ORDER — METRONIDAZOLE IN NACL 5-0.79 MG/ML-% IV SOLN
500.0000 mg | Freq: Once | INTRAVENOUS | Status: AC
  Administered 2012-03-12: 500 mg via INTRAVENOUS
  Filled 2012-03-12: qty 100

## 2012-03-12 MED ORDER — IOHEXOL 300 MG/ML  SOLN
50.0000 mL | Freq: Once | INTRAMUSCULAR | Status: AC | PRN
  Administered 2012-03-12: 50 mL via ORAL

## 2012-03-12 MED ORDER — FENTANYL CITRATE 0.05 MG/ML IJ SOLN
100.0000 ug | Freq: Once | INTRAMUSCULAR | Status: AC
  Administered 2012-03-12: 100 ug via INTRAVENOUS
  Filled 2012-03-12: qty 2

## 2012-03-12 NOTE — ED Provider Notes (Addendum)
History     CSN: [id number]  Arrival date & time 03/12/12  2023   First MD Initiated Contact with Patient 03/12/12 2147      Chief Complaint  Patient presents with  . Abdominal Pain     HPI Lower left quad abdominal pain since 10 am. Pain felt like gas but has not gotten better.  Denies fever or chills.  Denies constipation or diarrhea.  History of total hysterectomy including oophrectomy. Past Medical History  Diagnosis Date  . GERD (gastroesophageal reflux disease)   . Depression   . Hypertension   . Anxiety   . Allergic rhinitis   . Osteoarthritis   . Breast cancer   . Viral meningitis   . Vitamin D deficiency   . Hyperparathyroidism   . Family history of breast cancer in first degree relative   . Colon polyps   . Diabetes mellitus   . Edema   . Obesity     Past Surgical History  Procedure Date  . Breast surgery     Tran flap due to breast cancer  . Knee arthroscopy     bilateral  . Parathyroid resection   . Total knee arthroplasty 06/18/08    [facility]  . Mastectomy 01/13/94    Right breast  . Septoplasty 1980  . Tonsillectomy   . Parathyroidectomy   . Hand surgery     dog bite, right hand  . Hand surgery     trauma, left hand  . Tumor removal 1982    Right great toe  . Rectal abscess   . Cesarean section 1988  . Abdominal hysterectomy 1998  . Colonoscopy     Family History  Problem Relation Age of Onset  . Breast cancer    . Ovarian cancer    . Colon polyps    . Emphysema Father   . Lung cancer Father   . Breast cancer Maternal Aunt   . Colon cancer Maternal Aunt   . Breast cancer Paternal Grandfather   . Heart disease Paternal Grandfather   . Prostate cancer Paternal Grandfather     History  Substance Use Topics  . Smoking status: Never Smoker   . Smokeless tobacco: Never Used  . Alcohol Use: Yes     occ.    OB History    Grav Para Term Preterm Abortions TAB SAB Ect Mult Living   1 1              Review of Systems  All other  systems reviewed and are negative.    Allergies  Penicillins  Home Medications   Current Outpatient Rx  Name Route Sig Dispense Refill  . ALBUTEROL SULFATE HFA 108 (90 BASE) MCG/ACT IN AERS Inhalation Inhale 2 puffs into the lungs as directed. 1-2 sprays q 4 hours prn     . ALPRAZOLAM 0.5 MG PO TABS Oral Take 0.5 mg by mouth as directed. 1 tab every 4-6 hours prn severe anxiety     . BECLOMETHASONE DIPROPIONATE 40 MCG/ACT IN AERS Inhalation Inhale 2 puffs into the lungs as needed.     Marland Kitchen CALCIUM CARBONATE 1500 MG PO TABS Oral Take by mouth daily.      Marland Kitchen CIPROFLOXACIN HCL 500 MG PO TABS Oral Take 1 tablet (500 mg total) by mouth every 12 (twelve) hours. 10 tablet 0  . DIPHENOXYLATE-ATROPINE 2.5-0.025 MG PO TABS      . DULOXETINE HCL 30 MG PO CPEP Oral Take 1 capsule (30 mg total) by  mouth daily. 90 capsule 3  . ESOMEPRAZOLE MAGNESIUM 40 MG PO CPDR Oral Take 1 capsule (40 mg total) by mouth daily. 90 capsule 1  . FLUOCINOLONE ACETONIDE 0.025 % EX OINT Topical Apply topically 2 (two) times daily. 30 g 0  . FLUOCINOLONE ACETONIDE 0.01 % OT OIL Otic Place 5 drops in ear(s) 2 (two) times daily. 20 mL 1  . FLUTICASONE PROPIONATE 50 MCG/ACT NA SUSP Nasal 2 sprays by Nasal route daily.      . FUROSEMIDE 40 MG PO TABS Oral Take 1 tablet (40 mg total) by mouth 2 (two) times daily. 180 tablet 0  . MELOXICAM 15 MG PO TABS      . METFORMIN HCL 500 MG PO TABS Oral Take 1 tablet (500 mg total) by mouth 2 (two) times daily with a meal. 60 tablet 3  . METRONIDAZOLE 500 MG PO TABS Oral Take 1 tablet (500 mg total) by mouth 2 (two) times daily. 14 tablet 0  . ONE-DAILY MULTI VITAMINS PO TABS Oral Take 1 tablet by mouth daily.      . OXYCODONE-ACETAMINOPHEN 7.5-325 MG PO TABS Oral Take 1 tablet by mouth every 4 (four) hours as needed for pain. 30 tablet 0  . PROMETHAZINE HCL 25 MG PO TABS Oral Take 25 mg by mouth every 8 (eight) hours as needed.    . TELMISARTAN-HCTZ 80-25 MG PO TABS Oral Take 1 tablet by  mouth daily. 90 tablet 3  . TRAMADOL HCL 50 MG PO TABS      . POLYMYXIN B-TRIMETHOPRIM 10000-0.1 UNIT/ML-% OP SOLN  2 drops tid x7-10 days 10 mL 0  . VAGIFEM 10 MCG VA TABS        BP 113/60  Pulse 90  Temp 99.2 F (37.3 C) (Oral)  Resp 20  SpO2 97%  Physical Exam  Nursing note and vitals reviewed. Constitutional: She is oriented to person, place, and time. She appears well-developed and well-nourished. No distress.  HENT:  Head: Normocephalic and atraumatic.  Eyes: Pupils are equal, round, and reactive to light.  Neck: Normal range of motion.  Cardiovascular: Normal rate and intact distal pulses.   Pulmonary/Chest: No respiratory distress.  Abdominal: Normal appearance. She exhibits no distension. There is tenderness (Left lower quadrant.  No rebound or guarding.).  Musculoskeletal: Normal range of motion.  Neurological: She is alert and oriented to person, place, and time. No cranial nerve deficit.  Skin: Skin is warm and dry. No rash noted.  Psychiatric: She has a normal mood and affect. Her behavior is normal.    ED Course  Procedures (including critical care time)   Medications  metroNIDAZOLE (FLAGYL) 500 MG tablet (not administered)  ciprofloxacin (CIPRO) 500 MG tablet (not administered)  oxyCODONE-acetaminophen (PERCOCET) 7.5-325 MG per tablet (not administered)  ondansetron (ZOFRAN) injection 4 mg (4 mg Intravenous Given 03/12/12 2211)  fentaNYL (SUBLIMAZE) injection 100 mcg (100 mcg Intravenous Given 03/12/12 2214)  iohexol (OMNIPAQUE) 300 MG/ML solution 100 mL (100 mL Intravenous Contrast Given 03/12/12 2301)  iohexol (OMNIPAQUE) 300 MG/ML solution 50 mL (50 mL Oral Contrast Given 03/12/12 2301)  ciprofloxacin (CIPRO) IVPB 400 mg (0 mg Intravenous Stopped 03/13/12 0136)  metroNIDAZOLE (FLAGYL) IVPB 500 mg (500 mg Intravenous Given 03/12/12 2324)  fentaNYL (SUBLIMAZE) injection 50 mcg (50 mcg Intravenous Given 03/13/12 0031)      Labs Reviewed  URINALYSIS,  ROUTINE W REFLEX MICROSCOPIC - Abnormal; Notable for the following:    Hgb urine dipstick TRACE (*)     All other  components within normal limits  CBC WITH DIFFERENTIAL - Abnormal; Notable for the following:    WBC 11.6 (*)     Hemoglobin 11.5 (*)     HCT 35.7 (*)     RDW 15.9 (*)     Monocytes Absolute 1.1 (*)     All other components within normal limits  BASIC METABOLIC PANEL - Abnormal; Notable for the following:    Potassium 3.4 (*)     Glucose, Bld 115 (*)     GFR calc non Af Amer 80 (*)     All other components within normal limits  URINE MICROSCOPIC-ADD ON   Ct Abdomen Pelvis W Contrast  03/12/2012  *RADIOLOGY REPORT*  Clinical Data: Left lower quadrant pain.  Nausea.  Hysterectomy. Breast cancer with flap reconstruction.  CT ABDOMEN AND PELVIS WITH CONTRAST  Technique:  Multidetector CT imaging of the abdomen and pelvis was performed following the standard protocol during bolus administration of intravenous contrast.  Contrast: 50mL OMNIPAQUE IOHEXOL 300 MG/ML  SOLN, OMNIPAQUE IOHEXOL 300 MG/ML  SOLN  Comparison: Report of 06/20/2002.  Prior images not available.  Findings: Clear lung bases.  Right breast reconstruction.  Mild cardiomegaly.  Hepatomegaly, with liver measuring 19.7 cm cranial caudal. No focal liver lesion.  Normal spleen, stomach, pancreas, gallbladder, biliary tract, adrenal glands, kidneys. No retroperitoneal or retrocrural adenopathy.  Extensive colonic diverticulosis.  Edema surrounds the descending colon, including on image 58.  No surrounding abscess or free perforation. Normal terminal ileum and appendix.  Normal small bowel without abdominal ascites.  Prominent ileocolic mesenteric nodes , likely reactive.  The largest measures 1.1 cm on image 54.  No pelvic sidewall adenopathy.  Normal urinary bladder. Hysterectomy.  No adnexal mass.  No significant free fluid.  No acute osseous abnormality.  IMPRESSION:  1.  Uncomplicated descending diverticulitis. 2.   Hepatomegaly.   Original Report Authenticated By: Consuello Bossier, M.D.      1. Diverticulitis       MDM         Nelia Shi, MD 03/12/12 2317  Nelia Shi, MD 03/13/12 [phone]

## 2012-03-12 NOTE — ED Notes (Signed)
Drinking po contrast

## 2012-03-12 NOTE — ED Notes (Signed)
MD at bedside. 

## 2012-03-12 NOTE — ED Notes (Signed)
IV antibiotics in progress.

## 2012-03-12 NOTE — ED Notes (Signed)
Returned from CT.

## 2012-03-12 NOTE — ED Notes (Signed)
Robert with CT at bedside.

## 2012-03-12 NOTE — ED Notes (Signed)
Lower left quad abdominal pain since 10 am. Pain felt like gas but has not gotten better.

## 2012-03-13 NOTE — ED Notes (Signed)
Fluids given to drink and crackers given.

## 2012-03-13 NOTE — ED Notes (Signed)
Pt. C/o of increase in pain level. Medicated with fentanyl per order. 2nd antibiotic (cipro)  infusing.

## 2012-03-18 ENCOUNTER — Telehealth: Payer: Self-pay | Admitting: Family Medicine

## 2012-03-18 ENCOUNTER — Other Ambulatory Visit: Payer: Self-pay | Admitting: Family Medicine

## 2012-03-18 MED ORDER — GUAIFENESIN-CODEINE 100-10 MG/5ML PO SYRP: 10.0000 mL | ORAL_SOLUTION | Freq: Three times a day (TID) | ORAL | Status: AC | PRN

## 2012-03-18 NOTE — Telephone Encounter (Signed)
Script for codeine cough syrup printed.  If no improvement, will need OV to evaluate cough.  Aware she went to ER for diverticulitis, the cipro prescribed should treat any upper respiratory symptoms

## 2012-03-18 NOTE — Telephone Encounter (Signed)
Called best number as work number, left vm on work number, called pt mobile to advise no need for ED follow up and that a RX for cough syrup has been sent to pharmacy [facility] on [address] noted DPR on file and left detailed message on mobile if pt needs any further assistance to call office, faxed manually

## 2012-03-18 NOTE — Telephone Encounter (Signed)
Caller: Nanami/Patient; Patient Name: [name]; PCP: Sheliah Hatch.; Best Callback Phone Number: [phone]. Onset 02/17/12 Patient states she was seen in the office 03/11/12 and patient states she still has the cough, afebrile.  All emergent symptoms ruled out per Cough protocol with exception " Recurring episodes of cough or hoarseness within last 3 months."   Home care advice given.   Patient states she would also like to update provider concerning ED visit on 03/12/12 for diagnoses Diverticulitis.  Per disposition see provider within 72 hours, patient states since she was just seen in the office 03/11/12 and cough was discussed does she need RX for cough or does she need to schedule an appt.  Patient can be reached at the number list above until 5Pm then her cell at [phone].   PLEASE F/U WITH PATIENT WITH APPT. NEEDS.  THANK YOU.

## 2012-03-18 NOTE — Telephone Encounter (Signed)
Please advise if cough syrup can be called in for pt as well as does a ED follow up need to be scheduled

## 2012-03-19 ENCOUNTER — Institutional Professional Consult (permissible substitution): Payer: Managed Care, Other (non HMO) | Admitting: Pulmonary Disease

## 2012-03-21 ENCOUNTER — Ambulatory Visit: Payer: Managed Care, Other (non HMO) | Admitting: Critical Care Medicine

## 2012-03-31 ENCOUNTER — Emergency Department (HOSPITAL_BASED_OUTPATIENT_CLINIC_OR_DEPARTMENT_OTHER)
Admission: EM | Admit: 2012-03-31 | Discharge: 2012-03-31 | Disposition: A | Discharge status: Home / Self Care | Payer: Managed Care, Other (non HMO) | Attending: Emergency Medicine | Admitting: Emergency Medicine

## 2012-03-31 ENCOUNTER — Emergency Department (HOSPITAL_BASED_OUTPATIENT_CLINIC_OR_DEPARTMENT_OTHER): Payer: Managed Care, Other (non HMO)

## 2012-03-31 ENCOUNTER — Encounter (HOSPITAL_BASED_OUTPATIENT_CLINIC_OR_DEPARTMENT_OTHER): Payer: Self-pay | Admitting: *Deleted

## 2012-03-31 DIAGNOSIS — M62838 Other muscle spasm: Secondary | ICD-10-CM | POA: Insufficient documentation

## 2012-03-31 DIAGNOSIS — E213 Hyperparathyroidism, unspecified: Secondary | ICD-10-CM | POA: Insufficient documentation

## 2012-03-31 DIAGNOSIS — F411 Generalized anxiety disorder: Secondary | ICD-10-CM | POA: Insufficient documentation

## 2012-03-31 DIAGNOSIS — Z860100 Personal history of colon polyps, unspecified: Secondary | ICD-10-CM | POA: Insufficient documentation

## 2012-03-31 DIAGNOSIS — F329 Major depressive disorder, single episode, unspecified: Secondary | ICD-10-CM | POA: Insufficient documentation

## 2012-03-31 DIAGNOSIS — Z853 Personal history of malignant neoplasm of breast: Secondary | ICD-10-CM | POA: Insufficient documentation

## 2012-03-31 DIAGNOSIS — R059 Cough, unspecified: Secondary | ICD-10-CM | POA: Insufficient documentation

## 2012-03-31 DIAGNOSIS — I10 Essential (primary) hypertension: Secondary | ICD-10-CM | POA: Insufficient documentation

## 2012-03-31 DIAGNOSIS — K219 Gastro-esophageal reflux disease without esophagitis: Secondary | ICD-10-CM | POA: Insufficient documentation

## 2012-03-31 DIAGNOSIS — E119 Type 2 diabetes mellitus without complications: Secondary | ICD-10-CM | POA: Insufficient documentation

## 2012-03-31 DIAGNOSIS — Z79899 Other long term (current) drug therapy: Secondary | ICD-10-CM | POA: Insufficient documentation

## 2012-03-31 DIAGNOSIS — F3289 Other specified depressive episodes: Secondary | ICD-10-CM | POA: Insufficient documentation

## 2012-03-31 DIAGNOSIS — R05 Cough: Secondary | ICD-10-CM | POA: Insufficient documentation

## 2012-03-31 DIAGNOSIS — J3489 Other specified disorders of nose and nasal sinuses: Secondary | ICD-10-CM | POA: Insufficient documentation

## 2012-03-31 DIAGNOSIS — R062 Wheezing: Secondary | ICD-10-CM | POA: Insufficient documentation

## 2012-03-31 DIAGNOSIS — Z8661 Personal history of infections of the central nervous system: Secondary | ICD-10-CM | POA: Insufficient documentation

## 2012-03-31 DIAGNOSIS — J40 Bronchitis, not specified as acute or chronic: Secondary | ICD-10-CM

## 2012-03-31 DIAGNOSIS — E559 Vitamin D deficiency, unspecified: Secondary | ICD-10-CM | POA: Insufficient documentation

## 2012-03-31 DIAGNOSIS — E669 Obesity, unspecified: Secondary | ICD-10-CM | POA: Insufficient documentation

## 2012-03-31 DIAGNOSIS — Z8739 Personal history of other diseases of the musculoskeletal system and connective tissue: Secondary | ICD-10-CM | POA: Insufficient documentation

## 2012-03-31 DIAGNOSIS — R112 Nausea with vomiting, unspecified: Secondary | ICD-10-CM | POA: Insufficient documentation

## 2012-03-31 LAB — BASIC METABOLIC PANEL
BUN: 10 mg/dL (ref 6–23)
CO2: 26 mEq/L (ref 19–32)
Calcium: 8.8 mg/dL (ref 8.4–10.5)
Chloride: 100 mEq/L (ref 96–112)
Creatinine, Ser: 0.9 mg/dL (ref 0.50–1.10)
GFR calc Af Amer: 81 mL/min — ABNORMAL LOW (ref >90)
GFR calc non Af Amer: 70 mL/min — ABNORMAL LOW (ref >90)
Glucose, Bld: 171 mg/dL — ABNORMAL HIGH (ref 70–99)
Potassium: 3 mEq/L — ABNORMAL LOW (ref 3.5–5.1)
Sodium: 139 mEq/L (ref 135–145)

## 2012-03-31 LAB — CBC WITH DIFFERENTIAL/PLATELET
Basophils Absolute: 0 10*3/uL (ref 0.0–0.1)
Basophils Relative: 0 % (ref 0–1)
Eosinophils Absolute: 0.1 10*3/uL (ref 0.0–0.7)
Eosinophils Relative: 1 % (ref 0–5)
HCT: 31 % — ABNORMAL LOW (ref 36.0–46.0)
Hemoglobin: 9.9 g/dL — ABNORMAL LOW (ref 12.0–15.0)
Lymphocytes Relative: 28 % (ref 12–46)
Lymphs Abs: 2.8 10*3/uL (ref 0.7–4.0)
MCH: 26.8 pg (ref 26.0–34.0)
MCHC: 31.9 g/dL (ref 30.0–36.0)
MCV: 84 fL (ref 78.0–100.0)
Monocytes Absolute: 0.8 10*3/uL (ref 0.1–1.0)
Monocytes Relative: 8 % (ref 3–12)
Neutro Abs: 6.5 10*3/uL (ref 1.7–7.7)
Neutrophils Relative %: 63 % (ref 43–77)
Platelets: 270 10*3/uL (ref 150–400)
RBC: 3.69 MIL/uL — ABNORMAL LOW (ref 3.87–5.11)
RDW: 16.1 % — ABNORMAL HIGH (ref 11.5–15.5)
WBC: 10.2 10*3/uL (ref 4.0–10.5)

## 2012-03-31 MED ORDER — ALBUTEROL SULFATE (5 MG/ML) 0.5% IN NEBU
5.0000 mg | INHALATION_SOLUTION | Freq: Once | RESPIRATORY_TRACT | Status: AC
  Administered 2012-03-31: 5 mg via RESPIRATORY_TRACT

## 2012-03-31 MED ORDER — IPRATROPIUM BROMIDE 0.02 % IN SOLN
0.5000 mg | Freq: Once | RESPIRATORY_TRACT | Status: AC
  Administered 2012-03-31: 0.5 mg via RESPIRATORY_TRACT

## 2012-03-31 MED ORDER — PREDNISONE 10 MG PO TABS: 40.0000 mg | ORAL_TABLET | Freq: Every day | ORAL | Status: DC

## 2012-03-31 MED ORDER — IPRATROPIUM BROMIDE 0.02 % IN SOLN
RESPIRATORY_TRACT | Status: AC
  Administered 2012-03-31: 0.5 mg via RESPIRATORY_TRACT
  Filled 2012-03-31: qty 2.5

## 2012-03-31 MED ORDER — METHYLPREDNISOLONE SODIUM SUCC 125 MG IJ SOLR
125.0000 mg | Freq: Once | INTRAMUSCULAR | Status: AC
  Administered 2012-03-31: 125 mg via INTRAVENOUS
  Filled 2012-03-31: qty 2

## 2012-03-31 MED ORDER — ALBUTEROL SULFATE (5 MG/ML) 0.5% IN NEBU
INHALATION_SOLUTION | RESPIRATORY_TRACT | Status: AC
  Administered 2012-03-31: 5 mg via RESPIRATORY_TRACT
  Filled 2012-03-31: qty 1

## 2012-03-31 NOTE — ED Notes (Signed)
Pt. Has been cough congestion and wheezing for past 3 days. Was seen at urgent care on Sat and was given a breathing treatment and steroid  Injection. Pt. Presents in resp distress. Dry coughing on exam. Exp wheezing noted on exam. States she feels her chest is "tight" last breathing treatment was at 1900. States her chest just became "tighter and tighter"  Resp therapy to triage on arrival and breathing tx started.

## 2012-03-31 NOTE — ED Notes (Signed)
MD at bedside. 

## 2012-03-31 NOTE — ED Provider Notes (Signed)
History   This chart was scribed for Rolan Bucco, MD by Albertha Ghee Rifaie. This patient was seen in room MH08/MH08 and the patient's care was started at 2048.    CSN: [id number]  Arrival date & time 03/31/12  2029   First MD Initiated Contact with Patient 03/31/12 2048      Chief Complaint  Patient presents with  . Shortness of Breath    HPI  [name] is a 56 y.o. female who presents to the Emergency Department complaining of gradually worsening, constant, SOB onset 3 days ago. There are associated cough congestion, wheezing, and respiratory distress. Patient had her last breathing treatment at home couple of hours ago with some relief. She had respiratory therapy upon her arrival to triage and breathing tx started. Patient was also seen at urgent care yesterday and was given breathing treatment and steroid injection, started on albuterol MDI and neb, flovent and hydrocodone cough syrup.  She has had a 6 week hx of bronchitis symptoms with productive cough, was intitially on biaxin with some improvement, but cough has persisted.  No chest pain.  Has had some intermittent cramping of her calves.  No swelling to lower legs different than baseline  Past Medical History  Diagnosis Date  . GERD (gastroesophageal reflux disease)   . Depression   . Hypertension   . Anxiety   . Allergic rhinitis   . Osteoarthritis   . Breast cancer   . Viral meningitis   . Vitamin D deficiency   . Hyperparathyroidism   . Family history of breast cancer in first degree relative   . Colon polyps   . Diabetes mellitus   . Edema   . Obesity     Past Surgical History  Procedure Date  . Breast surgery     Tran flap due to breast cancer  . Knee arthroscopy     bilateral  . Parathyroid resection   . Total knee arthroplasty 06/18/08    Daldorf  . Mastectomy 01/13/94    Right breast  . Septoplasty 1980  . Tonsillectomy   . Parathyroidectomy   . Hand surgery     dog bite, right hand  . Hand  surgery     trauma, left hand  . Tumor removal 1982    Right great toe  . Rectal abscess   . Cesarean section 1988  . Abdominal hysterectomy 1998  . Colonoscopy     Family History  Problem Relation Age of Onset  . Breast cancer    . Ovarian cancer    . Colon polyps    . Emphysema Father   . Lung cancer Father   . Breast cancer Maternal Aunt   . Colon cancer Maternal Aunt   . Breast cancer Paternal Grandfather   . Heart disease Paternal Grandfather   . Prostate cancer Paternal Grandfather     History  Substance Use Topics  . Smoking status: Never Smoker   . Smokeless tobacco: Never Used  . Alcohol Use: Yes     Comment: occ.    OB History    Grav Para Term Preterm Abortions TAB SAB Ect Mult Living   1 1              Review of Systems  Constitutional: Negative for fever, chills, diaphoresis and fatigue.  HENT: Positive for congestion, sore throat, rhinorrhea and postnasal drip. Negative for sneezing.   Eyes: Negative.   Respiratory: Positive for cough and shortness of breath. Negative for  chest tightness.   Cardiovascular: Negative for chest pain and leg swelling.  Gastrointestinal: Positive for nausea and vomiting (post tussive). Negative for abdominal pain, diarrhea and blood in stool.  Genitourinary: Negative for frequency, hematuria, flank pain and difficulty urinating.  Musculoskeletal: Negative for back pain and arthralgias.  Skin: Negative for rash.  Neurological: Negative for dizziness, speech difficulty, weakness, numbness and headaches.    Allergies  Penicillins  Home Medications   Current Outpatient Rx  Name  Route  Sig  Dispense  Refill  . ALBUTEROL SULFATE HFA 108 (90 BASE) MCG/ACT IN AERS   Inhalation   Inhale 2 puffs into the lungs as directed. 1-2 sprays q 4 hours prn          . ALPRAZOLAM 0.5 MG PO TABS   Oral   Take 0.5 mg by mouth as directed. 1 tab every 4-6 hours prn severe anxiety          . BECLOMETHASONE DIPROPIONATE 40  MCG/ACT IN AERS   Inhalation   Inhale 2 puffs into the lungs as needed.          Marland Kitchen CALCIUM CARBONATE 1500 MG PO TABS   Oral   Take by mouth daily.           Marland Kitchen DIPHENOXYLATE-ATROPINE 2.5-0.025 MG PO TABS               . DULOXETINE HCL 30 MG PO CPEP   Oral   Take 1 capsule (30 mg total) by mouth daily.   90 capsule   3   . ESOMEPRAZOLE MAGNESIUM 40 MG PO CPDR   Oral   Take 1 capsule (40 mg total) by mouth daily.   90 capsule   1   . FLUOCINOLONE ACETONIDE 0.025 % EX OINT   Topical   Apply topically 2 (two) times daily.   30 g   0   . FLUOCINOLONE ACETONIDE 0.01 % OT OIL   Otic   Place 5 drops in ear(s) 2 (two) times daily.   20 mL   1   . FLUTICASONE PROPIONATE 50 MCG/ACT NA SUSP   Nasal   2 sprays by Nasal route daily.           . FUROSEMIDE 40 MG PO TABS   Oral   Take 1 tablet (40 mg total) by mouth 2 (two) times daily.   180 tablet   0   . MELOXICAM 15 MG PO TABS               . METFORMIN HCL 500 MG PO TABS   Oral   Take 1 tablet (500 mg total) by mouth 2 (two) times daily with a meal.   60 tablet   3   . METRONIDAZOLE 500 MG PO TABS   Oral   Take 1 tablet (500 mg total) by mouth 2 (two) times daily.   14 tablet   0   . ONE-DAILY MULTI VITAMINS PO TABS   Oral   Take 1 tablet by mouth daily.           . OXYCODONE-ACETAMINOPHEN 7.5-325 MG PO TABS   Oral   Take 1 tablet by mouth every 4 (four) hours as needed for pain.   30 tablet   0   . PREDNISONE 10 MG PO TABS   Oral   Take 4 tablets (40 mg total) by mouth daily.   20 tablet   0   . PROMETHAZINE HCL 25 MG PO TABS  Oral   Take 25 mg by mouth every 8 (eight) hours as needed.         . TELMISARTAN-HCTZ 80-25 MG PO TABS   Oral   Take 1 tablet by mouth daily.   90 tablet   3   . TRAMADOL HCL 50 MG PO TABS               . POLYMYXIN B-TRIMETHOPRIM 10000-0.1 UNIT/ML-% OP SOLN      2 drops tid x7-10 days   10 mL   0   . VAGIFEM 10 MCG VA TABS                   BP 133/74  Pulse 112  Temp 98.1 F (36.7 C) (Oral)  Resp 26  SpO2 100%  Physical Exam  Constitutional: She is oriented to person, place, and time. She appears well-developed and well-nourished.  HENT:  Head: Normocephalic and atraumatic.  Right Ear: External ear normal.  Left Ear: External ear normal.  Mouth/Throat: Oropharynx is clear and moist.  Eyes: Pupils are equal, round, and reactive to light.  Neck: Normal range of motion. Neck supple.  Cardiovascular: Normal rate, regular rhythm and normal heart sounds.   Pulmonary/Chest: Effort normal. No respiratory distress. She has wheezes (moderate bilateral expiratory wheezes.  no stridor). She has no rales. She exhibits no tenderness.  Abdominal: Soft. Bowel sounds are normal. There is no tenderness. There is no rebound and no guarding.  Musculoskeletal: Normal range of motion. She exhibits no edema and no tenderness.  Lymphadenopathy:    She has no cervical adenopathy.  Neurological: She is alert and oriented to person, place, and time.  Skin: Skin is warm and dry. No rash noted.  Psychiatric: She has a normal mood and affect.    ED Course  Procedures (including critical care time)  DIAGNOSTIC STUDIES: Oxygen Saturation is 100% on room air, normal by my interpretation.    COORDINATION OF CARE: 8:54 PM Discussed treatment plan with pt at bedside and pt agreed to plan.  Results for orders placed during the hospital encounter of 03/31/12  CBC WITH DIFFERENTIAL      Component Value Range   WBC 10.2  4.0 - 10.5 K/uL   RBC 3.69 (*) 3.87 - 5.11 MIL/uL   Hemoglobin 9.9 (*) 12.0 - 15.0 g/dL   HCT 16.1 (*) 09.6 - 04.5 %   MCV 84.0  78.0 - 100.0 fL   MCH 26.8  26.0 - 34.0 pg   MCHC 31.9  30.0 - 36.0 g/dL   RDW 40.9 (*) 81.1 - 91.4 %   Platelets 270  150 - 400 K/uL   Neutrophils Relative 63  43 - 77 %   Neutro Abs 6.5  1.7 - 7.7 K/uL   Lymphocytes Relative 28  12 - 46 %   Lymphs Abs 2.8  0.7 - 4.0 K/uL   Monocytes  Relative 8  3 - 12 %   Monocytes Absolute 0.8  0.1 - 1.0 K/uL   Eosinophils Relative 1  0 - 5 %   Eosinophils Absolute 0.1  0.0 - 0.7 K/uL   Basophils Relative 0  0 - 1 %   Basophils Absolute 0.0  0.0 - 0.1 K/uL  BASIC METABOLIC PANEL      Component Value Range   Sodium 139  135 - 145 mEq/L   Potassium 3.0 (*) 3.5 - 5.1 mEq/L   Chloride 100  96 - 112 mEq/L   CO2 26  19 - 32 mEq/L   Glucose, Bld 171 (*) 70 - 99 mg/dL   BUN 10  6 - 23 mg/dL   Creatinine, Ser 4.54  0.50 - 1.10 mg/dL   Calcium 8.8  8.4 - 09.8 mg/dL   GFR calc non Af Amer 70 (*) >90 mL/min   GFR calc Af Amer 81 (*) >90 mL/min   Dg Chest 2 View  03/31/2012  *RADIOLOGY REPORT*  Clinical Data: Cough, shortness of breath and wheezing.  CHEST - 2 VIEW  Comparison: Chest radiograph performed 03/30/2012, and CTA of the chest performed 06/12/2010  Findings: The lungs are well-aerated.  Slightly increased opacity at the right lung base is still thought to remain within normal limits, reflecting technique.  There is no evidence of pleural effusion or pneumothorax.  The heart is normal in size; the mediastinal contour is within normal limits.  No acute osseous abnormalities are seen.  Scattered clips are noted anterior to the upper abdomen and along the right breast.  IMPRESSION: No acute cardiopulmonary process seen.   Original Report Authenticated By: Tonia Ghent, M.D.    Ct Abdomen Pelvis W Contrast  03/12/2012  *RADIOLOGY REPORT*  Clinical Data: Left lower quadrant pain.  Nausea.  Hysterectomy. Breast cancer with flap reconstruction.  CT ABDOMEN AND PELVIS WITH CONTRAST  Technique:  Multidetector CT imaging of the abdomen and pelvis was performed following the standard protocol during bolus administration of intravenous contrast.  Contrast: 50mL OMNIPAQUE IOHEXOL 300 MG/ML  SOLN, OMNIPAQUE IOHEXOL 300 MG/ML  SOLN  Comparison: Report of 06/20/2002.  Prior images not available.  Findings: Clear lung bases.  Right breast  reconstruction.  Mild cardiomegaly.  Hepatomegaly, with liver measuring 19.7 cm cranial caudal. No focal liver lesion.  Normal spleen, stomach, pancreas, gallbladder, biliary tract, adrenal glands, kidneys. No retroperitoneal or retrocrural adenopathy.  Extensive colonic diverticulosis.  Edema surrounds the descending colon, including on image 58.  No surrounding abscess or free perforation. Normal terminal ileum and appendix.  Normal small bowel without abdominal ascites.  Prominent ileocolic mesenteric nodes , likely reactive.  The largest measures 1.1 cm on image 54.  No pelvic sidewall adenopathy.  Normal urinary bladder. Hysterectomy.  No adnexal mass.  No significant free fluid.  No acute osseous abnormality.  IMPRESSION:  1.  Uncomplicated descending diverticulitis. 2.  Hepatomegaly.   Original Report Authenticated By: Consuello Bossier, M.D.       Labs Reviewed  CBC WITH DIFFERENTIAL - Abnormal; Notable for the following:    RBC 3.69 (*)     Hemoglobin 9.9 (*)     HCT 31.0 (*)     RDW 16.1 (*)     All other components within normal limits  BASIC METABOLIC PANEL - Abnormal; Notable for the following:    Potassium 3.0 (*)     Glucose, Bld 171 (*)     GFR calc non Af Amer 70 (*)     GFR calc Af Amer 81 (*)     All other components within normal limits   Dg Chest 2 View  03/31/2012  *RADIOLOGY REPORT*  Clinical Data: Cough, shortness of breath and wheezing.  CHEST - 2 VIEW  Comparison: Chest radiograph performed 03/30/2012, and CTA of the chest performed 06/12/2010  Findings: The lungs are well-aerated.  Slightly increased opacity at the right lung base is still thought to remain within normal limits, reflecting technique.  There is no evidence of pleural effusion or pneumothorax.  The heart is normal in size;  the mediastinal contour is within normal limits.  No acute osseous abnormalities are seen.  Scattered clips are noted anterior to the upper abdomen and along the right breast.  IMPRESSION:  No acute cardiopulmonary process seen.   Original Report Authenticated By: Tonia Ghent, M.D.      1. Bronchitis       MDM  Pt feeling much better after breathing treatements/steroids.  Talking in full sentences.  No evidence of pneumonia.  Will d/c.  To f/u with her PMD tomorrow.  Given rx of prednisone.  Will return if symptoms worsen.  D-dimer neg and nothing else to suggest PE.  Will need to have hgb rechecked.      I personally performed the services described in this documentation, which was scribed in my presence.  The recorded information has been reviewed and considered.     Rolan Bucco, MD 03/31/12 [phone]

## 2012-04-02 ENCOUNTER — Institutional Professional Consult (permissible substitution): Payer: Managed Care, Other (non HMO) | Admitting: Pulmonary Disease

## 2012-04-02 ENCOUNTER — Ambulatory Visit (INDEPENDENT_AMBULATORY_CARE_PROVIDER_SITE_OTHER): Payer: Managed Care, Other (non HMO) | Admitting: Family Medicine

## 2012-04-02 ENCOUNTER — Encounter: Payer: Self-pay | Admitting: Family Medicine

## 2012-04-02 VITALS — BP 130/90 | HR 96 | Temp 98.8°F | Resp 17 | Wt 305.5 lb

## 2012-04-02 DIAGNOSIS — J050 Acute obstructive laryngitis [croup]: Secondary | ICD-10-CM | POA: Insufficient documentation

## 2012-04-02 MED ORDER — AZITHROMYCIN 250 MG PO TABS: ORAL_TABLET | ORAL | Status: DC

## 2012-04-02 MED ORDER — METHYLPREDNISOLONE ACETATE 80 MG/ML IJ SUSP
80.0000 mg | Freq: Once | INTRAMUSCULAR | Status: AC
  Administered 2012-04-02: 80 mg via INTRAMUSCULAR

## 2012-04-02 MED ORDER — ALBUTEROL SULFATE (5 MG/ML) 0.5% IN NEBU
2.5000 mg | INHALATION_SOLUTION | Freq: Once | RESPIRATORY_TRACT | Status: AC
  Administered 2012-04-02: 2.5 mg via RESPIRATORY_TRACT

## 2012-04-02 NOTE — Patient Instructions (Addendum)
This is croup- it is all upper airway Take the oral prednisone tomorrow- the shot will cover today Steam showers will improve your air movement and shortness of breath If really coughing- step outside.  The cold air will improve your symptoms Use the cough syrup as needed Continue the breathing treatments as needed Take the Zpack to treat possible atypical infection Call with any questions or concerns ER if unable to breathe Hang in there!!!

## 2012-04-02 NOTE — Progress Notes (Signed)
  Subjective:    Patient ID: [name], female    DOB: 1955/10/13, 56 y.o.   MRN: [id number]  HPI 'help me'- Friday had 'a little sniffle'.  Was 'gasping' on Saturday, sister took her to walk in clinic.  Was given 'breathing tx, steroid shot, 3 prescriptions'.  Went to ER on Sunday, had additional neb tx, steroids, CXR (normal).  Woke up this AM, 'i couldn't breathe'.  Using inhaler, neb tx and 'i'm still tight'.  Hasn't taken prednisone (40 mg) yet today b/c she hasn't eaten.  Used neb 5x yesterday.  Using Flovent,   Review of Systems For ROS see HPI     Objective:   Physical Exam  Vitals reviewed. Constitutional: She is oriented to person, place, and time. She appears well-developed and well-nourished. No distress.  HENT:  Head: Normocephalic and atraumatic.  Nose: Nose normal.  Mouth/Throat: Oropharynx is clear and moist. No oropharyngeal exudate.       No TTP over sinuses TMs normal bilaterally  Neck: Normal range of motion. Neck supple.  Cardiovascular: Normal rate, regular rhythm and normal heart sounds.   Pulmonary/Chest: Effort normal and breath sounds normal. No respiratory distress. She has no wheezes. She has no rales.       Deep barking cough consistent w/ croup  Lymphadenopathy:    She has no cervical adenopathy.  Neurological: She is alert and oriented to person, place, and time.  Skin: Skin is warm and dry.          Assessment & Plan:

## 2012-04-14 NOTE — Assessment & Plan Note (Signed)
New.  Reviewed ER notes, imaging.  Pt's persistence of cough and character/quality of cough consistent w/ croup.  Discussed dx w/ pt and tx- steroids, humidified air.  Will start Zpack in case of pertussis due to infant grandchild.  Reviewed supportive care and red flags that should prompt return.  Pt expressed understanding and is in agreement w/ plan.

## 2012-04-23 ENCOUNTER — Ambulatory Visit (INDEPENDENT_AMBULATORY_CARE_PROVIDER_SITE_OTHER): Payer: Managed Care, Other (non HMO) | Admitting: Pulmonary Disease

## 2012-04-23 ENCOUNTER — Encounter: Payer: Self-pay | Admitting: Pulmonary Disease

## 2012-04-23 VITALS — BP 122/84 | HR 86 | Temp 98.9°F | Ht 66.75 in | Wt 298.0 lb

## 2012-04-23 DIAGNOSIS — G4730 Sleep apnea, unspecified: Secondary | ICD-10-CM

## 2012-04-23 DIAGNOSIS — G4733 Obstructive sleep apnea (adult) (pediatric): Secondary | ICD-10-CM | POA: Insufficient documentation

## 2012-04-23 HISTORY — DX: Obstructive sleep apnea (adult) (pediatric): G47.33

## 2012-04-23 NOTE — Patient Instructions (Addendum)
Schedule sleep study

## 2012-04-23 NOTE — Progress Notes (Signed)
Subjective:    Patient ID: [name], female    DOB: 1955/09/22, 56 y.o.   MRN: [id number]  HPI 56 year old employee with [employer] presents for evaluation of obstructive sleep apnea. She at least 2 episodes in the past few weeks where she woke up gasping and choking and at one time felt like throwing up. Epworth sleepiness score is 15 or 24 and she report sleepiness and very suppression such as sitting and reading, watching TV, as a passenger in a car or sitting quietly after lunch. She reports a recent attack of bronchitis and laryngitis and she is still left with a cough productive of clear thick sputum. Bedtime is 10-11 PM, sleep latency is 30 minutes TV generally stays on through the night, she sleeps on her back in suggest her side with 2 pillows, she is 6 awakenings during the night including 3 bathroom visits but denies any post void sleep latency. She is out of bed at 6:30 feeling tired with dryness of mouth but denies morning headaches. She's gained 30 pounds in the last 2 years. She is a breast cancer survivor and a widow.   Past Medical History  Diagnosis Date  . GERD (gastroesophageal reflux disease)   . Depression   . Hypertension   . Anxiety   . Allergic rhinitis   . Osteoarthritis   . Breast cancer   . Viral meningitis   . Vitamin D deficiency   . Hyperparathyroidism   . Family history of breast cancer in first degree relative   . Colon polyps   . Diabetes mellitus   . Edema   . Obesity    Past Surgical History  Procedure Date  . Breast surgery     Tran flap due to breast cancer  . Knee arthroscopy     bilateral  . Parathyroid resection   . Total knee arthroplasty 06/18/08    Daldorf  . Mastectomy 01/13/94    Right breast  . Septoplasty 1980  . Tonsillectomy   . Parathyroidectomy   . Hand surgery     dog bite, right hand  . Hand surgery     trauma, left hand  . Tumor removal 1982    Right great toe  . Rectal abscess   . Cesarean section 1988   . Abdominal hysterectomy 1998  . Colonoscopy   . Adenoidectomy   . Left knee replacement 2010   Allergies  Allergen Reactions  . Penicillins Hives    History   Social History  . Marital Status: Widowed    Spouse Name: N/A    Number of Children: 1  . Years of Education: N/A   Occupational History  . ACCOUNTING [employer]   Social History Main Topics  . Smoking status: Never Smoker   . Smokeless tobacco: Never Used  . Alcohol Use: Yes     Comment: occ.  . Drug Use: No  . Sexually Active: Not on file   Other Topics Concern  . Not on file   Social History Narrative  . No narrative on file     Review of Systems  Constitutional: Negative for fever and unexpected weight change.  HENT: Positive for ear pain, congestion, rhinorrhea, sneezing, postnasal drip and sinus pressure. Negative for nosebleeds, sore throat, trouble swallowing and dental problem.   Eyes: Positive for itching. Negative for redness.  Respiratory: Positive for cough, chest tightness, shortness of breath and wheezing.   Cardiovascular: Positive for leg swelling. Negative for palpitations.  Gastrointestinal: Negative for nausea and vomiting.  Genitourinary: Negative for dysuria.  Musculoskeletal: Negative for joint swelling.  Skin: Negative for rash.  Neurological: Negative for headaches.  Hematological: Does not bruise/bleed easily.  Psychiatric/Behavioral: Positive for dysphoric mood. The patient is not nervous/anxious.        Objective:   Physical Exam  Gen. Pleasant, obese, in no distress, normal affect ENT - no lesions, no post nasal drip, class 2-3 airway Neck: No JVD, no thyromegaly, no carotid bruits Lungs: no use of accessory muscles, no dullness to percussion, decreased without rales or rhonchi  Cardiovascular: Rhythm regular, heart sounds  normal, no murmurs or gallops, no peripheral edema Abdomen: soft and non-tender, no hepatosplenomegaly, BS normal. Musculoskeletal: No  deformities, no cyanosis or clubbing Neuro:  alert, non focal, no tremors       Assessment & Plan:

## 2012-04-23 NOTE — Assessment & Plan Note (Signed)
Given excessive daytime somnolence, narrow pharyngeal exam, witnessed apneas & loud snoring, obstructive sleep apnea is very likely & an overnight polysomnogram will be scheduled as a split study. The pathophysiology of obstructive sleep apnea , it's cardiovascular consequences & modes of treatment including CPAP were discused with the patient in detail & they evidenced understanding. Would prefer in lab study since with insomnia, it will be difficult to quantify sleep time  Also this will enable titration since pre test probability is high

## 2012-05-01 ENCOUNTER — Ambulatory Visit (HOSPITAL_BASED_OUTPATIENT_CLINIC_OR_DEPARTMENT_OTHER): Payer: Managed Care, Other (non HMO) | Attending: Pulmonary Disease | Admitting: Radiology

## 2012-05-01 VITALS — Ht 66.0 in | Wt 298.0 lb

## 2012-05-01 DIAGNOSIS — Z9989 Dependence on other enabling machines and devices: Secondary | ICD-10-CM

## 2012-05-01 DIAGNOSIS — E669 Obesity, unspecified: Secondary | ICD-10-CM | POA: Insufficient documentation

## 2012-05-01 DIAGNOSIS — G4730 Sleep apnea, unspecified: Secondary | ICD-10-CM

## 2012-05-01 DIAGNOSIS — Z6841 Body Mass Index (BMI) 40.0 and over, adult: Secondary | ICD-10-CM | POA: Insufficient documentation

## 2012-05-01 DIAGNOSIS — G4733 Obstructive sleep apnea (adult) (pediatric): Secondary | ICD-10-CM | POA: Insufficient documentation

## 2012-05-09 ENCOUNTER — Telehealth: Payer: Self-pay | Admitting: Pulmonary Disease

## 2012-05-09 DIAGNOSIS — G4733 Obstructive sleep apnea (adult) (pediatric): Secondary | ICD-10-CM

## 2012-05-09 DIAGNOSIS — G4710 Hypersomnia, unspecified: Secondary | ICD-10-CM

## 2012-05-09 DIAGNOSIS — G4730 Sleep apnea, unspecified: Secondary | ICD-10-CM

## 2012-05-09 NOTE — Telephone Encounter (Signed)
PSG showed OSA corrected by cpap. Sent order to DME to start on CPAP 8cm, small full face mask

## 2012-05-10 NOTE — Procedures (Signed)
NAME:  [name], [name] NO.:  [id number]  MEDICAL RECORD NO.:  [id number]          PATIENT TYPE:  OUT  LOCATION:  SLEEP CENTER                 FACILITY:  [facility]  PHYSICIAN:  Oretha Milch, MD      DATE OF BIRTH:  [birth year]  DATE OF STUDY:  05/01/2012                           NOCTURNAL POLYSOMNOGRAM  REFERRING PHYSICIAN:  Oretha Milch, MD  INDICATION FOR THE STUDY:  [name] is a 56 year old obese woman with excessive daytime fatigue, loud snoring, and gasping and choking episodes during sleep.  At the time of this study, she weighed 298 pounds with a height of 5 feet 6 inches, BMI of 48, neck size of 16 inches.  Bedtime medications included metformin.  EPWORTH SLEEPINESS SCORE:  13.  This intervention polysomnogram was performed under sleep technologist in attendance.  EEG, EOG, EMG, EKG, and respiratory events were recommended. Limb movements, respiratory data, and sleep stages were scored according to criteria laid out by the American Academy of Sleep Medicine.  SLEEP ARCHITECTURE:  Lights out was at 10:54 p.m.  Lights on was at 5:18 a.m.  CPAP was initiated at 2:08 a.m.  During the baseline portion, total sleep time was 156 minutes with a sleep period time of 181 minutes and a sleep efficiency of 80%.  Sleep latency was 12 minutes and latency to REM sleep was 168 minutes.  Awake after sleep onset was 25 minutes. Sleep stages as a percentage of total sleep time was N1 8%, N2 66%, N3 18%, and REM sleep 8% (12 minutes).  Supine sleep accounted for 28 minutes.  During the titration portion, REM sleep accounted for 74 minutes.  The longest period of REM sleep was around 4 a.m.  RESPIRATORY DATA:  During the diagnostic portion, there were 3 obstructive apneas and 37 hypopneas with an apnea/hypopnea index of 15 events per hour and the lowest desaturation of 71%.  Due to this degree of respiratory disturbance, CPAP was initiated at 5 cm and titrated to a level  of 8 cm due to audible snoring.  Events were eliminated at this level but a few central apneas seem to emerge at 8 cm, hence bilevel was tried.  At a level of 8 cm for 75 minutes including 35 minutes of REM sleep, 1 obstructive apnea, 9 central apneas, and 5 hypopneas were noted.  AROUSAL DATA:  During the diagnostic portion, the arousal index was 26 events per hour.  During the titration portion, this was 10 events per hour.  LIMB MOVEMENT DATA:  Few limb movements were noted during the diagnostic portion and seemed to disappear during the titration study.  OXYGEN SATURATION DATA:  The desaturation index was 14 events per hour with a lowest desaturation of 71% during the titration portion.  This was mostly corrected.  She spent 7 minutes with a saturation less than 88% during the titration.  CARDIAC DATA:  The low heart rate was 46 beats per minute.  The high heart rate recorded was an artifact.  No arrhythmias were noted.  DISCUSSION:  She was desensitized with a small full-face mask, although events were eliminated at lower levels of CPAP.  This was  titrated up due to audible snoring and central events seem to emerge, hence bilevel was tried for patient comfort.  No oxygen was applied.  IMPRESSION: 1. Moderate obstructive sleep apnea with predominant hypopneas causing     sleep fragmentation and oxygen desaturation. 2. This was corrected by CPAP of 8 cm, although few central events     were noted.  CPAP titration was fair 3. No evidence of significant limb movements, cardiac arrhythmias, or     behavioral disturbance during sleep.  RECOMMENDATION: 1. Treatment options for this degree of sleep disordered breathing     include CPAP and weight loss. 2. CPAP can be initiated at 8 cm with a small full face mask and     humidity.  Compliance can be monitored for this level.  Presence of     central apneas can be checked on a download. 3. She should be asked to avoid medications  with sedative side     effects.  She should be cautioned against driving when sleepy.     Oretha Milch, MD    RVA/MEDQ  D:  05/09/2012 10:58:06  T:  05/10/2012 00:29:30  Job:  [id number]

## 2012-05-13 NOTE — Telephone Encounter (Signed)
[id number] x1 

## 2012-05-14 ENCOUNTER — Encounter: Payer: Self-pay | Admitting: Pulmonary Disease

## 2012-05-14 ENCOUNTER — Ambulatory Visit (INDEPENDENT_AMBULATORY_CARE_PROVIDER_SITE_OTHER): Payer: Managed Care, Other (non HMO) | Admitting: Pulmonary Disease

## 2012-05-14 VITALS — BP 120/70 | HR 110 | Temp 98.6°F | Ht 66.0 in | Wt 298.0 lb

## 2012-05-14 DIAGNOSIS — G4733 Obstructive sleep apnea (adult) (pediatric): Secondary | ICD-10-CM

## 2012-05-14 NOTE — Progress Notes (Signed)
  Subjective:    Patient ID: [name], female    DOB: 1955-07-21, 56 y.o.   MRN: [id number]  HPI 56 year old employee with [employer] presents for FU of obstructive sleep apnea.  She had at least 2 episodes in the past few weeks where she woke up gasping and choking and at one time felt like throwing up. Epworth sleepiness score is 15 or 24 and she report sleepiness and very suppression such as sitting and reading, watching TV, as a passenger in a car or sitting quietly after lunch. She reports a recent attack of bronchitis and laryngitis and she is still left with a cough productive of clear thick sputum.    05/14/2012  PSG showed mod OSA, 15/h with nadir desatn 71% corrected by CPAP 8cm, small full face mask Bedtime is 10-11 PM, sleep latency is 30 minutes TV generally stays on through the night, she sleeps on her back in suggest her side with 2 pillows, she is 6 awakenings during the night including 3 bathroom visits but denies any post void sleep latency. She is out of bed at 6:30 feeling tired with dryness of mouth but denies morning headaches. She's gained 30 pounds in the last 2 years.  She is a breast cancer survivor and a widow.  Review of Systems neg for any significant sore throat, dysphagia, itching, sneezing, nasal congestion or excess/ purulent secretions, fever, chills, sweats, unintended wt loss, pleuritic or exertional cp, hempoptysis, orthopnea pnd or change in chronic leg swelling. Also denies presyncope, palpitations, heartburn, abdominal pain, nausea, vomiting, diarrhea or change in bowel or urinary habits, dysuria,hematuria, rash, arthralgias, visual complaints, headache, numbness weakness or ataxia.     Objective:   Physical Exam  Gen. Pleasant, obese, in no distress ENT - no lesions, no post nasal drip Neck: No JVD, no thyromegaly, no carotid bruits Lungs: no use of accessory muscles, no dullness to percussion, decreased without rales or rhonchi   Cardiovascular: Rhythm regular, heart sounds  normal, no murmurs or gallops, no peripheral edema Musculoskeletal: No deformities, no cyanosis or clubbing , no tremors        Assessment & Plan:

## 2012-05-14 NOTE — Assessment & Plan Note (Signed)
12/13 Mod- AHI 15/h, corrected by CPAP 8 cm Initiate cpap 8 cm with small full face mask & chk download in 4 wks  Weight loss encouraged, compliance with goal of at least 4-6 hrs every night is the expectation. Advised against medications with sedative side effects Cautioned against driving when sleepy - understanding that sleepiness will vary on a day to day basis She had a good experience during the study so I expect her to adjust well.

## 2012-05-14 NOTE — Telephone Encounter (Signed)
Spoke with pt and notified that order was went to [facility] on 05-10-12 to start pt on Cpap.

## 2012-05-14 NOTE — Patient Instructions (Signed)
Your cpap will be set at 8 cm Send in chip before next appt so we can look at report

## 2012-05-17 ENCOUNTER — Telehealth: Payer: Self-pay | Admitting: Pulmonary Disease

## 2012-05-17 NOTE — Telephone Encounter (Signed)
i spoke with pt and advised her order was faxed to [facility] and it is being runned through her insurance. Nothing further was needed

## 2012-05-23 ENCOUNTER — Other Ambulatory Visit: Payer: Self-pay | Admitting: *Deleted

## 2012-05-23 NOTE — Telephone Encounter (Signed)
Last OV 11--13, last filled 07-05-11 #90 3

## 2012-05-24 MED ORDER — DULOXETINE HCL 30 MG PO CPEP: 30.0000 mg | ORAL_CAPSULE | Freq: Every day | ORAL | Status: DC

## 2012-05-24 NOTE — Telephone Encounter (Signed)
Ok for #90, 3 refills 

## 2012-05-24 NOTE — Telephone Encounter (Signed)
Rx sent 

## 2012-05-27 ENCOUNTER — Telehealth: Payer: Self-pay | Admitting: Family Medicine

## 2012-05-27 NOTE — Telephone Encounter (Signed)
pt is requesting thru the pharmacy a generic for Cymbalta in a 90-day supply -- if approved/available please send to [id number] fax# [phone]

## 2012-05-28 ENCOUNTER — Other Ambulatory Visit: Payer: Self-pay | Admitting: *Deleted

## 2012-05-28 DIAGNOSIS — F32A Depression, unspecified: Secondary | ICD-10-CM

## 2012-05-28 DIAGNOSIS — F329 Major depressive disorder, single episode, unspecified: Secondary | ICD-10-CM

## 2012-05-28 MED ORDER — DULOXETINE HCL 30 MG PO CPEP: 30.0000 mg | ORAL_CAPSULE | Freq: Every day | ORAL | Status: DC

## 2012-05-28 NOTE — Telephone Encounter (Signed)
Refill for duloxentine sent to [facility]

## 2012-06-12 ENCOUNTER — Ambulatory Visit: Payer: Managed Care, Other (non HMO) | Admitting: Family Medicine

## 2012-06-12 ENCOUNTER — Encounter: Payer: Self-pay | Admitting: Family Medicine

## 2012-06-12 ENCOUNTER — Ambulatory Visit (INDEPENDENT_AMBULATORY_CARE_PROVIDER_SITE_OTHER): Payer: Managed Care, Other (non HMO) | Admitting: Family Medicine

## 2012-06-12 VITALS — BP 110/60 | HR 90 | Temp 98.7°F | Ht 66.5 in | Wt 304.4 lb

## 2012-06-12 DIAGNOSIS — E119 Type 2 diabetes mellitus without complications: Secondary | ICD-10-CM

## 2012-06-12 DIAGNOSIS — I10 Essential (primary) hypertension: Secondary | ICD-10-CM

## 2012-06-12 DIAGNOSIS — G4733 Obstructive sleep apnea (adult) (pediatric): Secondary | ICD-10-CM

## 2012-06-12 LAB — BASIC METABOLIC PANEL
BUN: 15 mg/dL (ref 6–23)
CO2: 31 mEq/L (ref 19–32)
Calcium: 9.6 mg/dL (ref 8.4–10.5)
Chloride: 98 mEq/L (ref 96–112)
Creatinine, Ser: 0.8 mg/dL (ref 0.4–1.2)
GFR: 83.11 mL/min (ref >60.00)
Glucose, Bld: 94 mg/dL (ref 70–99)
Potassium: 3.8 mEq/L (ref 3.5–5.1)
Sodium: 136 mEq/L (ref 135–145)

## 2012-06-12 LAB — HEPATIC FUNCTION PANEL
ALT: 26 U/L (ref 0–35)
AST: 24 U/L (ref 0–37)
Albumin: 3.7 g/dL (ref 3.5–5.2)
Alkaline Phosphatase: 77 U/L (ref 39–117)
Bilirubin, Direct: 0 mg/dL (ref 0.0–0.3)
Total Bilirubin: 0.4 mg/dL (ref 0.3–1.2)
Total Protein: 7.6 g/dL (ref 6.0–8.3)

## 2012-06-12 LAB — LIPID PANEL
Cholesterol: 174 mg/dL (ref 0–200)
HDL: 40.7 mg/dL (ref >39.00)
LDL Cholesterol: 105 mg/dL — ABNORMAL HIGH (ref 0–99)
Total CHOL/HDL Ratio: 4
Triglycerides: 140 mg/dL (ref 0.0–149.0)
VLDL: 28 mg/dL (ref 0.0–40.0)

## 2012-06-12 LAB — HEMOGLOBIN A1C: Hgb A1c MFr Bld: 6.6 % — ABNORMAL HIGH (ref 4.6–6.5)

## 2012-06-12 NOTE — Progress Notes (Signed)
  Subjective:    Patient ID: [name], female    DOB: 03-09-1956, 57 y.o.   MRN: [id number]  HPI DM- relatively new problem for pt.  Currently on metformin daily which has improved nausea.  Denies symptomatic lows, numbness/tingling of hands/feet.  On ARB.  UTD on eye exam (last week).  HTN- chronic problem, excellent control today.  On Micardis HCT.  Denies CP, SOB, HAs, visual changes, edema.  Sleep Apnea- now using CPAP machine nightly, 'I feel so much better'.  Energy has improved.   Review of Systems For ROS see HPI     Objective:   Physical Exam  Vitals reviewed. Constitutional: She is oriented to person, place, and time. She appears well-developed and well-nourished. No distress.  HENT:  Head: Normocephalic and atraumatic.  Eyes: Conjunctivae normal and EOM are normal. Pupils are equal, round, and reactive to light.  Neck: Normal range of motion. Neck supple. No thyromegaly present.  Cardiovascular: Normal rate, regular rhythm, normal heart sounds and intact distal pulses.   No murmur heard. Pulmonary/Chest: Effort normal and breath sounds normal. No respiratory distress.  Abdominal: Soft. She exhibits no distension. There is no tenderness.  Musculoskeletal: She exhibits no edema.  Lymphadenopathy:    She has no cervical adenopathy.  Neurological: She is alert and oriented to person, place, and time.  Skin: Skin is warm and dry.  Psychiatric: She has a normal mood and affect. Her behavior is normal.          Assessment & Plan:

## 2012-06-12 NOTE — Patient Instructions (Addendum)
Schedule your complete physical in 3-4 months We'll notify you of your lab results and make any changes if needed Keep up the good work!  You can do this! Call with any questions or concerns Happy New Year! Happy Early [place] Ouch!!!

## 2012-06-13 ENCOUNTER — Telehealth: Payer: Self-pay | Admitting: Family Medicine

## 2012-06-13 NOTE — Telephone Encounter (Signed)
Refill: Furosemide 40 mg tablet. Take 1 tablet twice a day. Qty 60. Last fill 05-26-11

## 2012-06-14 MED ORDER — FUROSEMIDE 40 MG PO TABS: 40.0000 mg | ORAL_TABLET | Freq: Two times a day (BID) | ORAL | Status: DC

## 2012-06-14 NOTE — Telephone Encounter (Signed)
Rx sent to the pharmacy by e-script.//AB/CMA 

## 2012-06-17 NOTE — Assessment & Plan Note (Signed)
Pt tolerating med w/out difficulty.  Attempting to eat better.  Plans on increasing amount of exercise.  UTD on eye exam.  On ARB.  Check labs.  Adjust meds prn

## 2012-06-17 NOTE — Assessment & Plan Note (Signed)
New to provider.  Pt now officially dx'd and following w/ pulm.  Wearing CPAP nightly- 'i feel great'.  Will continue to follow.

## 2012-06-17 NOTE — Assessment & Plan Note (Signed)
Chronic problem.  At goal.  Asymptomatic.  No changes.

## 2012-06-18 ENCOUNTER — Other Ambulatory Visit: Payer: Self-pay | Admitting: *Deleted

## 2012-06-18 MED ORDER — FUROSEMIDE 40 MG PO TABS: 40.0000 mg | ORAL_TABLET | Freq: Two times a day (BID) | ORAL | Status: DC

## 2012-06-18 NOTE — Progress Notes (Signed)
Rx resent to correct pharmacy after call from pharmacy indicating that Rx was not received

## 2012-06-19 ENCOUNTER — Ambulatory Visit: Payer: Managed Care, Other (non HMO) | Admitting: Family Medicine

## 2012-06-25 ENCOUNTER — Encounter: Payer: Self-pay | Admitting: Pulmonary Disease

## 2012-06-25 ENCOUNTER — Ambulatory Visit (INDEPENDENT_AMBULATORY_CARE_PROVIDER_SITE_OTHER): Payer: Managed Care, Other (non HMO) | Admitting: Pulmonary Disease

## 2012-06-25 VITALS — BP 126/80 | HR 94 | Temp 98.2°F | Ht 67.0 in | Wt 308.0 lb

## 2012-06-25 DIAGNOSIS — G4733 Obstructive sleep apnea (adult) (pediatric): Secondary | ICD-10-CM

## 2012-06-25 NOTE — Assessment & Plan Note (Signed)
cpap is set at 8cm You are adjusting well We discussed care of your CPAP & supplies  Weight loss encouraged, compliance with goal of at least 4-6 hrs every night is the expectation. Advised against medications with sedative side effects Cautioned against driving when sleepy - understanding that sleepiness will vary on a day to day basis

## 2012-06-25 NOTE — Patient Instructions (Addendum)
Your cpap is set at 8cm You are adjusting well We discussed care of your CPAP & supplies

## 2012-06-25 NOTE — Progress Notes (Signed)
  Subjective:    Patient ID: [name], female    DOB: 08/10/1955, 57 y.o.   MRN: [id number]  HPI 57 year old  breast cancer survivor, employee with [employer] presents for FU of obstructive sleep apnea.  She had at least 2 episodes in the past few weeks where she woke up gasping and choking and at one time felt like throwing up. Epworth sleepiness score is 15 or 24 and she report sleepiness and very suppression such as sitting and reading, watching TV, as a passenger in a car or sitting quietly after lunch. She reports a recent attack of bronchitis and laryngitis and she is still left with a cough productive of clear thick sputum.  05/14/2012  PSG showed mod OSA, 15/h with nadir desatn 71% corrected by CPAP 8cm, small full face mask Bedtime is 10-11 PM, sleep latency is 30 minutes TV generally stays on through the night, she sleeps on her back in suggest her side with 2 pillows, she is 6 awakenings during the night including 3 bathroom visits but denies any post void sleep latency. She is out of bed at 6:30 feeling tired with dryness of mouth but denies morning headaches. She's gained 30 pounds in the last 2 years.    06/25/2012  for OSA. Pt states she is doing well on nasal mask and pressure. Pt is using machine every night for about  8 hours and with naps as well.  Download 1/14 - good usage 8h, avg pr 8cm, no residuals  Review of Systems neg for any significant sore throat, dysphagia, itching, sneezing, nasal congestion or excess/ purulent secretions, fever, chills, sweats, unintended wt loss, pleuritic or exertional cp, hempoptysis, orthopnea pnd or change in chronic leg swelling. Also denies presyncope, palpitations, heartburn, abdominal pain, nausea, vomiting, diarrhea or change in bowel or urinary habits, dysuria,hematuria, rash, arthralgias, visual complaints, headache, numbness weakness or ataxia.     Objective:   Physical Exam  Gen. Pleasant, obese, in no distress ENT - no  lesions, no post nasal drip Neck: No JVD, no thyromegaly, no carotid bruits Lungs: no use of accessory muscles, no dullness to percussion, decreased without rales or rhonchi  Cardiovascular: Rhythm regular, heart sounds  normal, no murmurs or gallops, no peripheral edema Musculoskeletal: No deformities, no cyanosis or clubbing , no tremors       Assessment & Plan:

## 2012-07-03 ENCOUNTER — Telehealth: Payer: Self-pay | Admitting: Pulmonary Disease

## 2012-07-08 ENCOUNTER — Other Ambulatory Visit: Payer: Self-pay | Admitting: Family Medicine

## 2012-07-08 DIAGNOSIS — Z853 Personal history of malignant neoplasm of breast: Secondary | ICD-10-CM

## 2012-07-08 DIAGNOSIS — Z9011 Acquired absence of right breast and nipple: Secondary | ICD-10-CM

## 2012-07-08 DIAGNOSIS — Z1239 Encounter for other screening for malignant neoplasm of breast: Secondary | ICD-10-CM

## 2012-07-09 ENCOUNTER — Encounter: Payer: Self-pay | Admitting: Pulmonary Disease

## 2012-07-19 ENCOUNTER — Ambulatory Visit (INDEPENDENT_AMBULATORY_CARE_PROVIDER_SITE_OTHER): Payer: Managed Care, Other (non HMO) | Admitting: Family Medicine

## 2012-07-19 ENCOUNTER — Encounter: Payer: Self-pay | Admitting: Family Medicine

## 2012-07-19 VITALS — BP 120/60 | HR 94 | Temp 97.6°F | Ht 66.5 in | Wt 307.8 lb

## 2012-07-19 DIAGNOSIS — F329 Major depressive disorder, single episode, unspecified: Secondary | ICD-10-CM

## 2012-07-19 DIAGNOSIS — F3289 Other specified depressive episodes: Secondary | ICD-10-CM

## 2012-07-19 NOTE — Patient Instructions (Addendum)
Decrease the Cymbalta to every other day for another week Then go w/ Monday/Thursday for 2 weeks and then stop completely Call with any questions or concerns Hang in there!! Happy Early [place] Ouch!!

## 2012-07-21 NOTE — Progress Notes (Signed)
  Subjective:    Patient ID: [name], female    DOB: [birth year], 57 y.o.   MRN: [id number]  HPI Depression- pt wants to wean off Cymbalta b/c she fears this may be impeding her weight loss progress.  Feels she is in a 'great' state emotionally.  No current depressive sxs.  'best i've been in years'.  Denies insomnia, tearfulness, anxiety.  Has already started taking med every other day.   Review of Systems For ROS see HPI     Objective:   Physical Exam  Vitals reviewed. Constitutional: She is oriented to person, place, and time. She appears well-developed and well-nourished. No distress.  Neurological: She is alert and oriented to person, place, and time.  Skin: Skin is warm and dry.  Psychiatric: She has a normal mood and affect. Her behavior is normal. Judgment and thought content normal.          Assessment & Plan:

## 2012-07-21 NOTE — Assessment & Plan Note (Signed)
Improved.  Pt now interested in weaning off meds.  Plan outlined (see pt instructions) along w/ discussion of potential side effects or withdrawal sxs.  Pt expressed understanding and is in agreement w/ plan.

## 2012-08-10 ENCOUNTER — Other Ambulatory Visit: Payer: Self-pay | Admitting: Family Medicine

## 2012-08-12 ENCOUNTER — Ambulatory Visit: Payer: Managed Care, Other (non HMO)

## 2012-08-22 ENCOUNTER — Other Ambulatory Visit: Payer: Self-pay | Admitting: Family Medicine

## 2012-08-29 ENCOUNTER — Ambulatory Visit
Admission: RE | Admit: 2012-08-29 | Discharge: 2012-08-29 | Disposition: A | Discharge status: Home / Self Care | Payer: Managed Care, Other (non HMO) | Source: Ambulatory Visit | Attending: Family Medicine | Admitting: Family Medicine

## 2012-08-29 ENCOUNTER — Other Ambulatory Visit: Payer: Self-pay | Admitting: Family Medicine

## 2012-08-29 DIAGNOSIS — Z1239 Encounter for other screening for malignant neoplasm of breast: Secondary | ICD-10-CM

## 2012-08-29 DIAGNOSIS — Z9011 Acquired absence of right breast and nipple: Secondary | ICD-10-CM

## 2012-08-29 DIAGNOSIS — Z853 Personal history of malignant neoplasm of breast: Secondary | ICD-10-CM

## 2012-09-04 ENCOUNTER — Other Ambulatory Visit: Payer: Self-pay | Admitting: Family Medicine

## 2012-09-04 NOTE — Telephone Encounter (Signed)
Pt has refill on file from previous Rx. Spoke to pharmacy will fill Rx refills on hold.

## 2012-09-06 ENCOUNTER — Telehealth: Payer: Self-pay | Admitting: Pulmonary Disease

## 2012-09-06 NOTE — Telephone Encounter (Signed)
Called pt to make next ov per recall.  Pt stated she is doing "fine" and will call @ a later date to make an appt. Leanora Ivanoff

## 2012-10-03 ENCOUNTER — Other Ambulatory Visit: Payer: Self-pay | Admitting: *Deleted

## 2012-10-03 MED ORDER — METFORMIN HCL 500 MG PO TABS: ORAL_TABLET | ORAL | Status: DC

## 2012-10-03 NOTE — Telephone Encounter (Signed)
Rx sent 

## 2012-10-04 ENCOUNTER — Encounter: Payer: Managed Care, Other (non HMO) | Admitting: Family Medicine

## 2012-10-08 ENCOUNTER — Encounter: Payer: Self-pay | Admitting: Family Medicine

## 2012-10-08 ENCOUNTER — Ambulatory Visit (INDEPENDENT_AMBULATORY_CARE_PROVIDER_SITE_OTHER): Payer: Managed Care, Other (non HMO) | Admitting: Family Medicine

## 2012-10-08 VITALS — BP 108/60 | HR 94 | Temp 98.5°F | Ht 66.5 in | Wt 298.4 lb

## 2012-10-08 DIAGNOSIS — R52 Pain, unspecified: Secondary | ICD-10-CM

## 2012-10-08 DIAGNOSIS — E119 Type 2 diabetes mellitus without complications: Secondary | ICD-10-CM

## 2012-10-08 DIAGNOSIS — Z0000 Encounter for general adult medical examination without abnormal findings: Secondary | ICD-10-CM | POA: Insufficient documentation

## 2012-10-08 HISTORY — DX: Encounter for general adult medical examination without abnormal findings: Z00.00

## 2012-10-08 LAB — CBC WITH DIFFERENTIAL/PLATELET
Basophils Absolute: 0 10*3/uL (ref 0.0–0.1)
Basophils Relative: 0.2 % (ref 0.0–3.0)
Eosinophils Absolute: 0.2 10*3/uL (ref 0.0–0.7)
Eosinophils Relative: 2.7 % (ref 0.0–5.0)
HCT: 34.1 % — ABNORMAL LOW (ref 36.0–46.0)
Hemoglobin: 11.6 g/dL — ABNORMAL LOW (ref 12.0–15.0)
Lymphocytes Relative: 29.7 % (ref 12.0–46.0)
Lymphs Abs: 2.5 10*3/uL (ref 0.7–4.0)
MCHC: 34 g/dL (ref 30.0–36.0)
MCV: 80.2 fl (ref 78.0–100.0)
Monocytes Absolute: 0.8 10*3/uL (ref 0.1–1.0)
Monocytes Relative: 9.4 % (ref 3.0–12.0)
Neutro Abs: 4.8 10*3/uL (ref 1.4–7.7)
Neutrophils Relative %: 58 % (ref 43.0–77.0)
Platelets: 248 10*3/uL (ref 150.0–400.0)
RBC: 4.26 Mil/uL (ref 3.87–5.11)
RDW: 16.7 % — ABNORMAL HIGH (ref 11.5–14.6)
WBC: 8.3 10*3/uL (ref 4.5–10.5)

## 2012-10-08 LAB — LIPID PANEL
Cholesterol: 156 mg/dL (ref 0–200)
HDL: 33.6 mg/dL — ABNORMAL LOW (ref >39.00)
LDL Cholesterol: 97 mg/dL (ref 0–99)
Total CHOL/HDL Ratio: 5
Triglycerides: 126 mg/dL (ref 0.0–149.0)
VLDL: 25.2 mg/dL (ref 0.0–40.0)

## 2012-10-08 LAB — HEPATIC FUNCTION PANEL
ALT: 24 U/L (ref 0–35)
AST: 23 U/L (ref 0–37)
Albumin: 3.7 g/dL (ref 3.5–5.2)
Alkaline Phosphatase: 67 U/L (ref 39–117)
Bilirubin, Direct: 0 mg/dL (ref 0.0–0.3)
Total Bilirubin: 0.5 mg/dL (ref 0.3–1.2)
Total Protein: 7.5 g/dL (ref 6.0–8.3)

## 2012-10-08 LAB — BASIC METABOLIC PANEL
BUN: 16 mg/dL (ref 6–23)
CO2: 29 mEq/L (ref 19–32)
Calcium: 9.3 mg/dL (ref 8.4–10.5)
Chloride: 97 mEq/L (ref 96–112)
Creatinine, Ser: 1 mg/dL (ref 0.4–1.2)
GFR: 60.48 mL/min (ref >60.00)
Glucose, Bld: 108 mg/dL — ABNORMAL HIGH (ref 70–99)
Potassium: 3.1 mEq/L — ABNORMAL LOW (ref 3.5–5.1)
Sodium: 135 mEq/L (ref 135–145)

## 2012-10-08 LAB — HEMOGLOBIN A1C: Hgb A1c MFr Bld: 6.5 % (ref 4.6–6.5)

## 2012-10-08 LAB — TSH: TSH: 0.52 u[IU]/mL (ref 0.35–5.50)

## 2012-10-08 MED ORDER — PREGABALIN 75 MG PO CAPS: 75.0000 mg | ORAL_CAPSULE | Freq: Two times a day (BID) | ORAL | Status: DC

## 2012-10-08 NOTE — Patient Instructions (Addendum)
Follow up in 6 weeks to see if the Lyrica is helping Start the Lyrica twice daily to improve the pain We'll notify you of your lab results and make any changes if needed I'm proud of you for exercising!  Keep it up! Call w/ any questions or concerns Happy [place] Mother's Day!

## 2012-10-08 NOTE — Assessment & Plan Note (Signed)
Tolerating metformin.  UTD on eye exam.  Foot exam WNL.  Check labs.  Adjust meds prn

## 2012-10-08 NOTE — Progress Notes (Signed)
  Subjective:    Patient ID: [name], female    DOB: 05-26-1956, 57 y.o.   MRN: [id number]  HPI CPE- UTD on GYN (pap, mammo), colonoscopy.  Recently joined a gym.  Pt notes ongoing fatigue, poor sleep, diffuse pain just to touch.  Not related to recent exercise.  sxs have worsened since stopping Cymbalta.   Review of Systems Patient reports no vision/ hearing changes, adenopathy,fever, weight change,  persistant/recurrent hoarseness , swallowing issues, chest pain, palpitations, edema, persistant/recurrent cough, hemoptysis, dyspnea (rest/exertional/paroxysmal nocturnal), gastrointestinal bleeding (melena, rectal bleeding), abdominal pain, significant heartburn, bowel changes, GU symptoms (dysuria, hematuria, incontinence), Gyn symptoms (abnormal  bleeding, pain),  syncope, focal weakness, memory loss, numbness & tingling, skin/hair/nail changes, abnormal bruising or bleeding, anxiety, or depression.     Objective:   Physical Exam General Appearance:    Alert, cooperative, no distress, appears stated age  Head:    Normocephalic, without obvious abnormality, atraumatic  Eyes:    PERRL, conjunctiva/corneas clear, EOM's intact, fundi    benign, both eyes  Ears:    Normal TM's and external ear canals, both ears  Nose:   Nares normal, septum midline, mucosa normal, no drainage    or sinus tenderness  Throat:   Lips, mucosa, and tongue normal; teeth and gums normal  Neck:   Supple, symmetrical, trachea midline, no adenopathy;    Thyroid: no enlargement/tenderness/nodules  Back:     Symmetric, no curvature, ROM normal, no CVA tenderness  Lungs:     Clear to auscultation bilaterally, respirations unlabored  Chest Wall:    No tenderness or deformity   Heart:    Regular rate and rhythm, S1 and S2 normal, no murmur, rub   or gallop  Breast Exam:    Deferred to GYN  Abdomen:     Soft, non-tender, bowel sounds active all four quadrants,    no masses, no organomegaly  Genitalia:    Deferred  to GYN  Rectal:    Extremities:   Extremities normal, atraumatic, no cyanosis or edema  Pulses:   2+ and symmetric all extremities  Skin:   Skin color, texture, turgor normal, no rashes or lesions  Lymph nodes:   Cervical, supraclavicular, and axillary nodes normal  Neurologic:   CNII-XII intact, normal strength, sensation and reflexes    throughout          Assessment & Plan:

## 2012-10-08 NOTE — Assessment & Plan Note (Signed)
Given pt's combo of disordered sleep, diffuse/unexplained pain, and intermittent depression suspect that she has fibromyalgia.  Pt's pain worsened after stopping cymbalta.  Start lyrica and see if sxs improve.  Will follow.

## 2012-10-08 NOTE — Assessment & Plan Note (Signed)
Pt's PE WNL w/ exception of obesity.  UTD on GYN and colonoscopy.  Check labs.  Anticipatory guidance provided.  

## 2012-10-14 LAB — VITAMIN D 1,25 DIHYDROXY
Vitamin D 1, 25 (OH)2 Total: 62 pg/mL (ref 18–72)
Vitamin D2 1, 25 (OH)2: <8 pg/mL
Vitamin D3 1, 25 (OH)2: 62 pg/mL

## 2012-10-17 ENCOUNTER — Encounter (HOSPITAL_BASED_OUTPATIENT_CLINIC_OR_DEPARTMENT_OTHER): Payer: Self-pay | Admitting: *Deleted

## 2012-10-17 ENCOUNTER — Emergency Department (HOSPITAL_BASED_OUTPATIENT_CLINIC_OR_DEPARTMENT_OTHER): Payer: Managed Care, Other (non HMO)

## 2012-10-17 ENCOUNTER — Emergency Department (HOSPITAL_BASED_OUTPATIENT_CLINIC_OR_DEPARTMENT_OTHER)
Admission: EM | Admit: 2012-10-17 | Discharge: 2012-10-17 | Disposition: A | Discharge status: Home / Self Care | Payer: Managed Care, Other (non HMO) | Attending: Emergency Medicine | Admitting: Emergency Medicine

## 2012-10-17 DIAGNOSIS — Z8619 Personal history of other infectious and parasitic diseases: Secondary | ICD-10-CM | POA: Insufficient documentation

## 2012-10-17 DIAGNOSIS — H53149 Visual discomfort, unspecified: Secondary | ICD-10-CM | POA: Insufficient documentation

## 2012-10-17 DIAGNOSIS — M1990 Unspecified osteoarthritis, unspecified site: Secondary | ICD-10-CM | POA: Insufficient documentation

## 2012-10-17 DIAGNOSIS — Z79899 Other long term (current) drug therapy: Secondary | ICD-10-CM | POA: Insufficient documentation

## 2012-10-17 DIAGNOSIS — K219 Gastro-esophageal reflux disease without esophagitis: Secondary | ICD-10-CM | POA: Insufficient documentation

## 2012-10-17 DIAGNOSIS — Z862 Personal history of diseases of the blood and blood-forming organs and certain disorders involving the immune mechanism: Secondary | ICD-10-CM | POA: Insufficient documentation

## 2012-10-17 DIAGNOSIS — IMO0002 Reserved for concepts with insufficient information to code with codable children: Secondary | ICD-10-CM | POA: Insufficient documentation

## 2012-10-17 DIAGNOSIS — R110 Nausea: Secondary | ICD-10-CM | POA: Insufficient documentation

## 2012-10-17 DIAGNOSIS — F411 Generalized anxiety disorder: Secondary | ICD-10-CM | POA: Insufficient documentation

## 2012-10-17 DIAGNOSIS — I10 Essential (primary) hypertension: Secondary | ICD-10-CM | POA: Insufficient documentation

## 2012-10-17 DIAGNOSIS — F3289 Other specified depressive episodes: Secondary | ICD-10-CM | POA: Insufficient documentation

## 2012-10-17 DIAGNOSIS — Z8639 Personal history of other endocrine, nutritional and metabolic disease: Secondary | ICD-10-CM | POA: Insufficient documentation

## 2012-10-17 DIAGNOSIS — J3489 Other specified disorders of nose and nasal sinuses: Secondary | ICD-10-CM | POA: Insufficient documentation

## 2012-10-17 DIAGNOSIS — G43909 Migraine, unspecified, not intractable, without status migrainosus: Secondary | ICD-10-CM | POA: Insufficient documentation

## 2012-10-17 DIAGNOSIS — F329 Major depressive disorder, single episode, unspecified: Secondary | ICD-10-CM | POA: Insufficient documentation

## 2012-10-17 DIAGNOSIS — Z8709 Personal history of other diseases of the respiratory system: Secondary | ICD-10-CM | POA: Insufficient documentation

## 2012-10-17 DIAGNOSIS — Z853 Personal history of malignant neoplasm of breast: Secondary | ICD-10-CM | POA: Insufficient documentation

## 2012-10-17 DIAGNOSIS — Z880 Allergy status to penicillin: Secondary | ICD-10-CM | POA: Insufficient documentation

## 2012-10-17 DIAGNOSIS — E669 Obesity, unspecified: Secondary | ICD-10-CM | POA: Insufficient documentation

## 2012-10-17 DIAGNOSIS — Z85038 Personal history of other malignant neoplasm of large intestine: Secondary | ICD-10-CM | POA: Insufficient documentation

## 2012-10-17 DIAGNOSIS — E119 Type 2 diabetes mellitus without complications: Secondary | ICD-10-CM | POA: Insufficient documentation

## 2012-10-17 MED ORDER — SODIUM CHLORIDE 0.9 % IV BOLUS (SEPSIS)
1000.0000 mL | Freq: Once | INTRAVENOUS | Status: AC
  Administered 2012-10-17: 1000 mL via INTRAVENOUS

## 2012-10-17 MED ORDER — DIPHENHYDRAMINE HCL 50 MG/ML IJ SOLN
25.0000 mg | Freq: Once | INTRAMUSCULAR | Status: AC
  Administered 2012-10-17: 25 mg via INTRAVENOUS
  Filled 2012-10-17: qty 1

## 2012-10-17 MED ORDER — SODIUM CHLORIDE 0.9 % IV SOLN
INTRAVENOUS | Status: DC
  Administered 2012-10-17: 18:00:00 via INTRAVENOUS

## 2012-10-17 MED ORDER — PROMETHAZINE HCL 25 MG/ML IJ SOLN
12.5000 mg | Freq: Once | INTRAMUSCULAR | Status: AC
  Administered 2012-10-17: 12.5 mg via INTRAVENOUS
  Filled 2012-10-17: qty 1

## 2012-10-17 MED ORDER — DEXAMETHASONE SODIUM PHOSPHATE 10 MG/ML IJ SOLN
10.0000 mg | Freq: Once | INTRAMUSCULAR | Status: AC
  Administered 2012-10-17: 10 mg via INTRAVENOUS
  Filled 2012-10-17: qty 1

## 2012-10-17 NOTE — ED Notes (Signed)
Migraine headache since this am. Felt like it was sinusitis but has had no relief with OTC medication.

## 2012-10-17 NOTE — ED Provider Notes (Signed)
History     CSN: [id number]  Arrival date & time 10/17/12  1334   First MD Initiated Contact with Patient 10/17/12 1510      Chief Complaint  Patient presents with  . Migraine    (Consider location/radiation/quality/duration/timing/severity/associated sxs/prior treatment) Patient is a 57 y.o. female presenting with migraines. The history is provided by the patient.  Migraine Associated symptoms include headaches. Pertinent negatives include no chest pain, no abdominal pain and no shortness of breath.   patient with history of migraines in the past. She onset of headache similar to her migraines this morning at 6 in the morning to not go to bed with a headache. Headache is a 10 out of 10 it is all over throbbing in nature associated with nausea no vomiting no fever and sinus pressure she thought maybe she was having some sinus problems patient with the window open. Last time she had a migraine was in 1998. Headache is made worse by movement and light a better by rest.  Past Medical History  Diagnosis Date  . GERD (gastroesophageal reflux disease)   . Depression   . Hypertension   . Anxiety   . Allergic rhinitis   . Osteoarthritis   . Breast cancer   . Viral meningitis   . Vitamin D deficiency   . Hyperparathyroidism   . Family history of breast cancer in first degree relative   . Colon polyps   . Diabetes mellitus   . Edema   . Obesity     Past Surgical History  Procedure Laterality Date  . Breast surgery      Tran flap due to breast cancer  . Knee arthroscopy      bilateral  . Parathyroid resection    . Total knee arthroplasty  06/18/08    [facility]  . Mastectomy  01/13/94    Right breast  . Septoplasty  1980  . Tonsillectomy    . Parathyroidectomy    . Hand surgery      dog bite, right hand  . Hand surgery      trauma, left hand  . Tumor removal  1982    Right great toe  . Rectal abscess    . Cesarean section  1988  . Abdominal hysterectomy  1998  .  Colonoscopy    . Adenoidectomy    . Left knee replacement  2010    Family History  Problem Relation Age of Onset  . Breast cancer    . Ovarian cancer    . Colon polyps    . Emphysema Father   . Lung cancer Father   . Breast cancer Maternal Aunt   . Colon cancer Maternal Aunt   . Breast cancer Paternal Grandfather   . Heart disease Paternal Grandfather   . Prostate cancer Paternal Grandfather   . Asthma Daughter     "seasonal"  . Arthritis Maternal Grandmother     History  Substance Use Topics  . Smoking status: Never Smoker   . Smokeless tobacco: Never Used  . Alcohol Use: Yes     Comment: occ.    OB History   Grav Para Term Preterm Abortions TAB SAB Ect Mult Living   1 1              Review of Systems  Constitutional: Negative for fever.  HENT: Positive for congestion. Negative for sore throat and sinus pressure.   Eyes: Positive for photophobia.  Respiratory: Negative for cough and shortness of breath.  Cardiovascular: Negative for chest pain.  Gastrointestinal: Negative for abdominal pain.  Genitourinary: Negative for dysuria.  Musculoskeletal: Negative for back pain.  Skin: Negative for rash.  Neurological: Positive for headaches. Negative for speech difficulty, weakness and numbness.  Hematological: Does not bruise/bleed easily.  Psychiatric/Behavioral: Negative for confusion.    Allergies  Penicillins  Home Medications   Current Outpatient Rx  Name  Route  Sig  Dispense  Refill  . Potassium (POTASSIMIN PO)   Oral   Take by mouth.         Marland Kitchen albuterol (PROAIR HFA) 108 (90 BASE) MCG/ACT inhaler   Inhalation   Inhale 2 puffs into the lungs as directed. 1-2 sprays q 4 hours prn          . beclomethasone (QVAR) 40 MCG/ACT inhaler   Inhalation   Inhale 2 puffs into the lungs as needed.          . Calcium Carbonate (CALTRATE 600) 1500 MG TABS   Oral   Take by mouth daily.           . chlorpheniramine-HYDROcodone (TUSSIONEX) 10-8 MG/5ML  LQCR      as needed.          . DULoxetine (CYMBALTA) 30 MG capsule   Oral   Take 1 capsule (30 mg total) by mouth daily.   90 capsule   3   . esomeprazole (NEXIUM) 40 MG capsule   Oral   Take 1 capsule (40 mg total) by mouth daily.   90 capsule   1   . ferrous sulfate 325 (65 FE) MG EC tablet   Oral   Take 325 mg by mouth daily.         . Fluocinolone Acetonide 0.01 % OIL   Otic   Place 5 drops in ear(s) 2 (two) times daily as needed.         . fluticasone (FLONASE) 50 MCG/ACT nasal spray   Nasal   2 sprays by Nasal route daily.           . furosemide (LASIX) 40 MG tablet   Oral   Take 1 tablet (40 mg total) by mouth 2 (two) times daily.   180 tablet   1   . meloxicam (MOBIC) 15 MG tablet      as needed.          . metFORMIN (GLUCOPHAGE) 500 MG tablet      TAKE 1 TABLET (500 MG TOTAL) BY MOUTH 2 (TWO) TIMES DAILY WITH A MEAL.   180 tablet   0   . Multiple Vitamin (MULTIVITAMIN) tablet   Oral   Take 1 tablet by mouth daily.           . Omega-3 Fatty Acids (FISH OIL PO)   Oral   Take 1 capsule by mouth daily.         . pregabalin (LYRICA) 75 MG capsule   Oral   Take 1 capsule (75 mg total) by mouth 2 (two) times daily.   60 capsule   3   . promethazine (PHENERGAN) 25 MG tablet   Oral   Take 25 mg by mouth every 8 (eight) hours as needed.         Marland Kitchen telmisartan-hydrochlorothiazide (MICARDIS HCT) 80-25 MG per tablet   Oral   Take 1 tablet by mouth daily.   90 tablet   3   . Throat Lozenges (ZINC + VITAMIN C) LOZG   Mouth/Throat   Use as  directed in the mouth or throat.           BP 146/93  Pulse 90  Temp(Src) 99.2 F (37.3 C) (Oral)  Resp 22  Wt 298 lb (135.172 kg)  BMI 47.38 kg/m2  SpO2 96%  Physical Exam  Nursing note and vitals reviewed. Constitutional: She is oriented to person, place, and time. She appears well-developed and well-nourished. No distress.  HENT:  Head: Normocephalic and atraumatic.  Mouth/Throat:  Oropharynx is clear and moist.  Eyes: Conjunctivae are normal. Pupils are equal, round, and reactive to light.  Neck: Normal range of motion. Neck supple.  Cardiovascular: Normal rate, regular rhythm and normal heart sounds.   No murmur heard. Pulmonary/Chest: Effort normal and breath sounds normal. No respiratory distress.  Abdominal: Soft. Bowel sounds are normal.  Musculoskeletal: Normal range of motion.  Neurological: She is alert and oriented to person, place, and time. No cranial nerve deficit. She exhibits normal muscle tone. Coordination normal.  Skin: Skin is warm. No rash noted. No erythema.    ED Course  Procedures (including critical care time)  Labs Reviewed - No data to display Ct Head Wo Contrast  10/17/2012   *RADIOLOGY REPORT*  Clinical Data: Migraine headaches  CT HEAD WITHOUT CONTRAST  Technique:  Contiguous axial images were obtained from the base of the skull through the vertex without contrast.  Comparison: CT sinus dated 06/27/2004  Findings: No evidence of parenchymal hemorrhage or extra-axial fluid collection. No mass lesion, mass effect, or midline shift.  No CT evidence of acute infarction.  Cerebral volume is age appropriate.  No ventriculomegaly.  The visualized paranasal sinuses are essentially clear. The mastoid air cells are unopacified.  No evidence of calvarial fracture.  IMPRESSION: No evidence of acute intracranial abnormality.   Original Report Authenticated By: Charline Bills, M.D.     1. Migraine       MDM    Patient with history of migraines has not had one since the late 90s but today's headache is similar. Headache improved significantly with migraine cocktail. Is almost completely gone. Head CT was done because she was slow to respond to the migraine cocktail but his CT is negative. No evidence of head bleed or intracranial mass. Patient will be discharged home with followup with her primary care doctor. Not concerned about an infectious  process. No evidence of sinusitis.        Shelda Jakes, MD 10/17/12 [phone]

## 2012-10-17 NOTE — ED Notes (Signed)
Scanned all meds and fluid multiple times with no result of scanning.  Scanner in charge mode with inabiltiy to scan.

## 2012-12-04 ENCOUNTER — Telehealth: Payer: Self-pay | Admitting: Family Medicine

## 2012-12-04 NOTE — Telephone Encounter (Signed)
Patient called to make appointment to recheck her A1c. Does she need a f/u visit with Dr. Beverely Low or labs only? If labs only, can you place orders please?

## 2012-12-10 NOTE — Telephone Encounter (Signed)
LM @ (2:26pm) asking the pt to RTC regarding appt for A1c.//AB/CMA

## 2012-12-12 NOTE — Telephone Encounter (Signed)
Spoke with the pt and informed her that she will need to have an office visit with Dr. Beverely Low before having the HgbA1c drawn.  Informed the pt that Dr. Beverely Low would like to see her diabetic pt every 3-4 mos.   Pt agreed and scheduled an appt with Dr. Beverely Low.  Pt also stated that at her last office visit she was given a new rx for Lyrica, which she did not take b/c she was told it would make her gain wt.  Verbally informed Dr. Beverely Low of this. //AB/CMA

## 2013-01-08 ENCOUNTER — Encounter: Payer: Self-pay | Admitting: Family Medicine

## 2013-01-08 ENCOUNTER — Ambulatory Visit (INDEPENDENT_AMBULATORY_CARE_PROVIDER_SITE_OTHER): Payer: Managed Care, Other (non HMO) | Admitting: Family Medicine

## 2013-01-08 VITALS — BP 112/62 | HR 105 | Temp 99.1°F | Ht 66.5 in | Wt 297.4 lb

## 2013-01-08 DIAGNOSIS — F329 Major depressive disorder, single episode, unspecified: Secondary | ICD-10-CM

## 2013-01-08 DIAGNOSIS — E119 Type 2 diabetes mellitus without complications: Secondary | ICD-10-CM

## 2013-01-08 MED ORDER — FLUOXETINE HCL 20 MG PO CAPS: 20.0000 mg | ORAL_CAPSULE | Freq: Every day | ORAL | Status: DC

## 2013-01-08 NOTE — Progress Notes (Signed)
  Subjective:    Patient ID: [name], female    DOB: Apr 29, 1956, 57 y.o.   MRN: [id number]  HPI DM- chronic problem.  Tolerating metformin w/out difficulty.  No symptoms.  No CP, SOB, HAs, visual changes, N/V/D.  UTD on eye exam.  Depression- went to a session at [facility] and 'i lost it'.  'i don't want to go back'.  Therapist recommended meds to control anxiety.  Pt aware that she doesn't know herself or what she enjoys b/c she's been caring for her sisters since age 68.   Review of Systems For ROS see HPI     Objective:   Physical Exam  Vitals reviewed. Constitutional: She is oriented to person, place, and time. She appears well-developed and well-nourished. No distress.  HENT:  Head: Normocephalic and atraumatic.  Eyes: Conjunctivae and EOM are normal. Pupils are equal, round, and reactive to light.  Neck: Normal range of motion. Neck supple. No thyromegaly present.  Cardiovascular: Normal rate, regular rhythm, normal heart sounds and intact distal pulses.   No murmur heard. Pulmonary/Chest: Effort normal and breath sounds normal. No respiratory distress.  Abdominal: Soft. She exhibits no distension. There is no tenderness.  Musculoskeletal: She exhibits no edema.  Lymphadenopathy:    She has no cervical adenopathy.  Neurological: She is alert and oriented to person, place, and time.  Skin: Skin is warm and dry.  Psychiatric: She has a normal mood and affect. Her behavior is normal.          Assessment & Plan:

## 2013-01-08 NOTE — Patient Instructions (Addendum)
Follow up in 1 month to recheck mood Google 'meetup [employer]in[employer]' and get out there! Start the Prozac daily We'll notify you of your lab results and make any changes if needed Call with any questions or concerns Hang in there!!  You're doing great!

## 2013-01-09 LAB — BASIC METABOLIC PANEL
BUN: 18 mg/dL (ref 6–23)
CO2: 30 mEq/L (ref 19–32)
Calcium: 9.3 mg/dL (ref 8.4–10.5)
Chloride: 99 mEq/L (ref 96–112)
Creatinine, Ser: 1.4 mg/dL — ABNORMAL HIGH (ref 0.4–1.2)
GFR: 41.32 mL/min — ABNORMAL LOW (ref >60.00)
Glucose, Bld: 104 mg/dL — ABNORMAL HIGH (ref 70–99)
Potassium: 3.7 mEq/L (ref 3.5–5.1)
Sodium: 136 mEq/L (ref 135–145)

## 2013-01-09 LAB — HEMOGLOBIN A1C: Hgb A1c MFr Bld: 6.5 % (ref 4.6–6.5)

## 2013-01-09 NOTE — Assessment & Plan Note (Addendum)
Chronic problem.  Typically well controlled.  On ARB for renal protection.  Tolerating metformin w/out difficulty but is considering switching to a med that would possibly assist w/ weight loss.  UTD on eye exam.  Check labs.  Adjust meds prn.

## 2013-01-09 NOTE — Assessment & Plan Note (Signed)
Chronic problem.  Restart SSRI but change to prozac.  Encouraged her to start putting herself out there- discussed Meetup groups.  Will follow closely.

## 2013-01-16 ENCOUNTER — Telehealth: Payer: Self-pay | Admitting: Family Medicine

## 2013-01-16 NOTE — Telephone Encounter (Signed)
Pt is taking Prozac.  If she is taking it in the morning, she should try taking it at night or vice versa.  If side effects continue after another week (2 weeks total) we can stop or change med

## 2013-01-16 NOTE — Telephone Encounter (Signed)
Patient states that the Paxal medication she has started taking is giving her a headache and effecting her sleep. She wants to know if she can stop taking it. Confirmed the name of the medication, did not see this listed on her medication list.  Please advise.

## 2013-01-20 NOTE — Telephone Encounter (Signed)
Patient returning missed call. Can be reached at work [phone].

## 2013-01-20 NOTE — Telephone Encounter (Signed)
Pt was called at work as she requested. States not to worry about the medication. She stopped the Prozac on Wednesday. States she seems to have a reaction with all medications these days.

## 2013-01-20 NOTE — Telephone Encounter (Signed)
Called pt and LMOVM to return call.  °

## 2013-03-04 ENCOUNTER — Other Ambulatory Visit: Payer: Self-pay | Admitting: Family Medicine

## 2013-03-05 NOTE — Telephone Encounter (Signed)
Med filled.  

## 2013-03-10 ENCOUNTER — Encounter: Payer: Self-pay | Admitting: Family Medicine

## 2013-03-10 ENCOUNTER — Ambulatory Visit (INDEPENDENT_AMBULATORY_CARE_PROVIDER_SITE_OTHER): Payer: Managed Care, Other (non HMO) | Admitting: Family Medicine

## 2013-03-10 VITALS — BP 122/84 | HR 87 | Temp 98.7°F | Resp 16 | Wt 310.4 lb

## 2013-03-10 DIAGNOSIS — M109 Gout, unspecified: Secondary | ICD-10-CM

## 2013-03-10 DIAGNOSIS — R609 Edema, unspecified: Secondary | ICD-10-CM

## 2013-03-10 HISTORY — DX: Gout, unspecified: M10.9

## 2013-03-10 LAB — URIC ACID: Uric Acid, Serum: 6.2 mg/dL (ref 2.4–7.0)

## 2013-03-10 LAB — BASIC METABOLIC PANEL
BUN: 11 mg/dL (ref 6–23)
CO2: 27 mEq/L (ref 19–32)
Calcium: 9 mg/dL (ref 8.4–10.5)
Chloride: 100 mEq/L (ref 96–112)
Creatinine, Ser: 0.9 mg/dL (ref 0.4–1.2)
GFR: 68.2 mL/min (ref >60.00)
Glucose, Bld: 117 mg/dL — ABNORMAL HIGH (ref 70–99)
Potassium: 3.8 mEq/L (ref 3.5–5.1)
Sodium: 137 mEq/L (ref 135–145)

## 2013-03-10 MED ORDER — INDOMETHACIN 50 MG PO CAPS: 50.0000 mg | ORAL_CAPSULE | Freq: Three times a day (TID) | ORAL | Status: DC

## 2013-03-10 NOTE — Patient Instructions (Signed)
Start the Indocin 3x/day w/ food and as soon as pain is tolerable, decrease to twice daily, and then once daily and then stop We'll notify you of your lab results and decide if we can double the Lasix to twice daily Call with any questions or concerns Hang in there!!!  Gout Gout is an inflammatory condition (arthritis) caused by a buildup of uric acid crystals in the joints. Uric acid is a chemical that is normally present in the blood. Under some circumstances, uric acid can form into crystals in your joints. This causes joint redness, soreness, and swelling (inflammation). Repeat attacks are common. Over time, uric acid crystals can form into masses (tophi) near a joint, causing disfigurement. Gout is treatable and often preventable. CAUSES  The disease begins with elevated levels of uric acid in the blood. Uric acid is produced by your body when it breaks down a naturally found substance called purines. This also happens when you eat certain foods such as meats and fish. Causes of an elevated uric acid level include:  Being passed down from parent to child (heredity).  Diseases that cause increased uric acid production (obesity, psoriasis, some cancers).  Excessive alcohol use.  Diet, especially diets rich in meat and seafood.  Medicines, including certain cancer-fighting drugs (chemotherapy), diuretics, and aspirin.  Chronic kidney disease. The kidneys are no longer able to remove uric acid well.  Problems with metabolism. Conditions strongly associated with gout include:  Obesity.  High blood pressure.  High cholesterol.  Diabetes. Not everyone with elevated uric acid levels gets gout. It is not understood why some people get gout and others do not. Surgery, joint injury, and eating too much of certain foods are some of the factors that can lead to gout. SYMPTOMS   An attack of gout comes on quickly. It causes intense pain with redness, swelling, and warmth in a  joint.  Fever can occur.  Often, only one joint is involved. Certain joints are more commonly involved:  Base of the big toe.  Knee.  Ankle.  Wrist.  Finger. Without treatment, an attack usually goes away in a few days to weeks. Between attacks, you usually will not have symptoms, which is different from many other forms of arthritis. DIAGNOSIS  Your caregiver will suspect gout based on your symptoms and exam. Removal of fluid from the joint (arthrocentesis) is done to check for uric acid crystals. Your caregiver will give you a medicine that numbs the area (local anesthetic) and use a needle to remove joint fluid for exam. Gout is confirmed when uric acid crystals are seen in joint fluid, using a special microscope. Sometimes, blood, urine, and X-ray tests are also used. TREATMENT  There are 2 phases to gout treatment: treating the sudden onset (acute) attack and preventing attacks (prophylaxis). Treatment of an Acute Attack  Medicines are used. These include anti-inflammatory medicines or steroid medicines.  An injection of steroid medicine into the affected joint is sometimes necessary.  The painful joint is rested. Movement can worsen the arthritis.  You may use warm or cold treatments on painful joints, depending which works best for you.  Discuss the use of coffee, vitamin C, or cherries with your caregiver. These may be helpful treatment options. Treatment to Prevent Attacks After the acute attack subsides, your caregiver may advise prophylactic medicine. These medicines either help your kidneys eliminate uric acid from your body or decrease your uric acid production. You may need to stay on these medicines for a very  long time. The early phase of treatment with prophylactic medicine can be associated with an increase in acute gout attacks. For this reason, during the first few months of treatment, your caregiver may also advise you to take medicines usually used for acute  gout treatment. Be sure you understand your caregiver's directions. You should also discuss dietary treatment with your caregiver. Certain foods such as meats and fish can increase uric acid levels. Other foods such as dairy can decrease levels. Your caregiver can give you a list of foods to avoid. HOME CARE INSTRUCTIONS   Do not take aspirin to relieve pain. This raises uric acid levels.  Only take over-the-counter or prescription medicines for pain, discomfort, or fever as directed by your caregiver.  Rest the joint as much as possible. When in bed, keep sheets and blankets off painful areas.  Keep the affected joint raised (elevated).  Use crutches if the painful joint is in your leg.  Drink enough water and fluids to keep your urine clear or pale yellow. This helps your body get rid of uric acid. Do not drink alcoholic beverages. They slow the passage of uric acid.  Follow your caregiver's dietary instructions. Pay careful attention to the amount of protein you eat. Your daily diet should emphasize fruits, vegetables, whole grains, and fat-free or low-fat milk products.  Maintain a healthy body weight. SEEK MEDICAL CARE IF:   You have an oral temperature above 102 F (38.9 C).  You develop diarrhea, vomiting, or any side effects from medicines.  You do not feel better in 24 hours, or you are getting worse. SEEK IMMEDIATE MEDICAL CARE IF:   Your joint becomes suddenly more tender and you have:  Chills.  An oral temperature above 102 F (38.9 C), not controlled by medicine. MAKE SURE YOU:   Understand these instructions.  Will watch your condition.  Will get help right away if you are not doing well or get worse. Document Released: 05/12/2000 Document Revised: 08/07/2011 Document Reviewed: 08/23/2009 [facility] Patient Information 2014 [place], [place].

## 2013-03-10 NOTE — Assessment & Plan Note (Signed)
New.  Start Indocin.  Check Uric Acid level.  Reviewed low purine diet.  If sxs recur, will need to consider allopurinol.  Pt expressed understanding and is in agreement w/ plan.

## 2013-03-10 NOTE — Assessment & Plan Note (Signed)
Recurrent problem for pt.  Check BMP.  Reviewed low Na diet.  If labs ok, will double lasix to BID.

## 2013-03-10 NOTE — Progress Notes (Signed)
  Subjective:    Patient ID: [name], female    DOB: 1955-06-19, 57 y.o.   MRN: [id number]  HPI R MTP joint pain- sxs started 'several weeks ago'.  Thought it was gout so started Cherry juice w/ good improvement but this was too expensive to maintain.  Area is swollen, painful to touch.  Unable to wear shoes.  Has never had gout previously.  Had xray (Ortho office) and this was negative for fx.  Was started on Mobic which improved hands and back but not foot pain.  Edema- pt reports that her leg swelling is minimal in the AM after taking the Lasix b/c she finds that she can effectively urinate.  After lunch, she feels that her fluid retention worsens.  Interested in adjusting meds.   Review of Systems For ROS see HPI     Objective:   Physical Exam  Vitals reviewed. Constitutional: She appears well-developed and well-nourished. No distress.  Musculoskeletal: She exhibits edema (of R 1st MTP joint) and tenderness (very tender to touch over R 1st MTP joint).  Skin: Skin is warm and dry.          Assessment & Plan:

## 2013-03-15 ENCOUNTER — Other Ambulatory Visit: Payer: Self-pay | Admitting: Family Medicine

## 2013-03-17 NOTE — Telephone Encounter (Signed)
Med filled.  

## 2013-04-03 ENCOUNTER — Other Ambulatory Visit: Payer: Self-pay

## 2013-04-10 ENCOUNTER — Encounter: Payer: Self-pay | Admitting: Family Medicine

## 2013-04-10 ENCOUNTER — Ambulatory Visit (INDEPENDENT_AMBULATORY_CARE_PROVIDER_SITE_OTHER): Payer: Managed Care, Other (non HMO) | Admitting: Family Medicine

## 2013-04-10 VITALS — BP 110/68 | HR 106 | Temp 98.8°F | Resp 16 | Wt 305.1 lb

## 2013-04-10 DIAGNOSIS — E119 Type 2 diabetes mellitus without complications: Secondary | ICD-10-CM

## 2013-04-10 DIAGNOSIS — J019 Acute sinusitis, unspecified: Secondary | ICD-10-CM

## 2013-04-10 MED ORDER — CLARITHROMYCIN ER 500 MG PO TB24: 1000.0000 mg | ORAL_TABLET | Freq: Every day | ORAL | Status: AC

## 2013-04-10 MED ORDER — BENZONATATE 200 MG PO CAPS: 200.0000 mg | ORAL_CAPSULE | Freq: Three times a day (TID) | ORAL | Status: DC | PRN

## 2013-04-10 NOTE — Assessment & Plan Note (Signed)
Pt's sxs and PE consistent w/ infxn.  Start abx.  Cough meds prn.  No dextromethorphan due to intolerance.  Reviewed supportive care and red flags that should prompt return.  Pt expressed understanding and is in agreement w/ plan.

## 2013-04-10 NOTE — Assessment & Plan Note (Signed)
Chronic problem.  Tolerating metformin.  Denies symptomatic lows.  Check labs.  Adjust meds prn

## 2013-04-10 NOTE — Progress Notes (Signed)
  Subjective:    Patient ID: [name], female    DOB: 08/21/1955, 57 y.o.   MRN: [id number]  HPI Pre visit review using our clinic review tool, if applicable. No additional management support is needed unless otherwise documented below in the visit note.  URI- sxs started Monday w/ sore throat, nasal congestion.  + PND.  Deep, barking cough.  No fevers.  + facial pain/pressure.  + sneezing.  No known sick contacts.  No N/V/D  DM- chronic problem, on Metformin.  Denies symptomatic lows.  Denies CP, SOB, HAs, visual changes.  On ARB for renal protection.   Review of Systems For ROS see HPI     Objective:   Physical Exam  Vitals reviewed. Constitutional: She appears well-developed and well-nourished. No distress.  HENT:  Head: Normocephalic and atraumatic.  Right Ear: Tympanic membrane normal.  Left Ear: Tympanic membrane normal.  Nose: Mucosal edema and rhinorrhea present. Right sinus exhibits maxillary sinus tenderness and frontal sinus tenderness. Left sinus exhibits maxillary sinus tenderness and frontal sinus tenderness.  Mouth/Throat: Uvula is midline and mucous membranes are normal. Posterior oropharyngeal erythema present. No oropharyngeal exudate.  Eyes: Conjunctivae and EOM are normal. Pupils are equal, round, and reactive to light.  Neck: Normal range of motion. Neck supple.  Cardiovascular: Normal rate, regular rhythm and normal heart sounds.   Pulmonary/Chest: Effort normal and breath sounds normal. No respiratory distress. She has no wheezes.  Deep, barking cough  Musculoskeletal: She exhibits edema (trace edema bilaterally).  Lymphadenopathy:    She has no cervical adenopathy.          Assessment & Plan:

## 2013-04-10 NOTE — Patient Instructions (Signed)
Follow up in 3-4 months to recheck sugar We'll notify you of your lab results Start the Biaxin- 2 pills at the same time each day (take w/ food) Tessalon as needed for cough Drink plenty of fluids REST! Happy Holidays!!!

## 2013-04-11 LAB — HEPATIC FUNCTION PANEL
ALT: 31 U/L (ref 0–35)
AST: 25 U/L (ref 0–37)
Albumin: 3.6 g/dL (ref 3.5–5.2)
Alkaline Phosphatase: 69 U/L (ref 39–117)
Bilirubin, Direct: 0.1 mg/dL (ref 0.0–0.3)
Total Bilirubin: 0.4 mg/dL (ref 0.3–1.2)
Total Protein: 7.1 g/dL (ref 6.0–8.3)

## 2013-04-11 LAB — BASIC METABOLIC PANEL
BUN: 12 mg/dL (ref 6–23)
CO2: 30 mEq/L (ref 19–32)
Calcium: 8.9 mg/dL (ref 8.4–10.5)
Chloride: 97 mEq/L (ref 96–112)
Creatinine, Ser: 1 mg/dL (ref 0.4–1.2)
GFR: 61.8 mL/min (ref >60.00)
Glucose, Bld: 123 mg/dL — ABNORMAL HIGH (ref 70–99)
Potassium: 3.4 mEq/L — ABNORMAL LOW (ref 3.5–5.1)
Sodium: 135 mEq/L (ref 135–145)

## 2013-04-11 LAB — LIPID PANEL
Cholesterol: 150 mg/dL (ref 0–200)
HDL: 32.2 mg/dL — ABNORMAL LOW (ref >39.00)
Total CHOL/HDL Ratio: 5
Triglycerides: 240 mg/dL — ABNORMAL HIGH (ref 0.0–149.0)
VLDL: 48 mg/dL — ABNORMAL HIGH (ref 0.0–40.0)

## 2013-04-11 LAB — TSH: TSH: 0.86 u[IU]/mL (ref 0.35–5.50)

## 2013-04-11 LAB — LDL CHOLESTEROL, DIRECT: Direct LDL: 96.5 mg/dL

## 2013-04-11 LAB — HEMOGLOBIN A1C: Hgb A1c MFr Bld: 6.9 % — ABNORMAL HIGH (ref 4.6–6.5)

## 2013-04-16 ENCOUNTER — Telehealth: Payer: Self-pay | Admitting: *Deleted

## 2013-04-16 MED ORDER — PREDNISONE 10 MG PO TABS: ORAL_TABLET | ORAL | Status: DC

## 2013-04-16 NOTE — Telephone Encounter (Signed)
Ok for Prednisone 10mg , 3x 3 days, 2x 3 days, 1x 3 days, #18, no refills

## 2013-04-16 NOTE — Telephone Encounter (Signed)
Patient called and stated that she was given Biaxin on her last visit which was 04/10/2013. Patient states that she isn't feeling any better. She understands that the antibiotic was written for 10 days. Patient states in the past prednisone has worked for her. Patient would like to know if she get prednisone called into the pharmacy for her. Please advise. SW

## 2013-04-16 NOTE — Telephone Encounter (Signed)
Patient notified and understood directions

## 2013-05-07 ENCOUNTER — Ambulatory Visit (INDEPENDENT_AMBULATORY_CARE_PROVIDER_SITE_OTHER): Payer: Managed Care, Other (non HMO) | Admitting: Family Medicine

## 2013-05-07 ENCOUNTER — Encounter: Payer: Self-pay | Admitting: Family Medicine

## 2013-05-07 VITALS — BP 120/82 | HR 99 | Temp 98.8°F | Wt 303.0 lb

## 2013-05-07 DIAGNOSIS — J45991 Cough variant asthma: Secondary | ICD-10-CM

## 2013-05-07 MED ORDER — ALBUTEROL SULFATE HFA 108 (90 BASE) MCG/ACT IN AERS: 2.0000 | INHALATION_SPRAY | RESPIRATORY_TRACT | Status: DC | PRN

## 2013-05-07 MED ORDER — BECLOMETHASONE DIPROPIONATE 40 MCG/ACT IN AERS: 2.0000 | INHALATION_SPRAY | Freq: Two times a day (BID) | RESPIRATORY_TRACT | Status: DC

## 2013-05-07 MED ORDER — SCOPOLAMINE 1 MG/3DAYS TD PT72: 1.0000 | MEDICATED_PATCH | TRANSDERMAL | Status: DC

## 2013-05-07 MED ORDER — IPRATROPIUM-ALBUTEROL 0.5-2.5 (3) MG/3ML IN SOLN
3.0000 mL | Freq: Once | RESPIRATORY_TRACT | Status: AC
  Administered 2013-05-07: 3 mL via RESPIRATORY_TRACT

## 2013-05-07 NOTE — Progress Notes (Signed)
   Subjective:    Patient ID: [name], female    DOB: 04-Dec-1955, 57 y.o.   MRN: [id number]  HPI Pre visit review using our clinic review tool, if applicable. No additional management support is needed unless otherwise documented below in the visit note.  Cough- productive.  Pt has not felt well since mid-November.  Completed course of Biaxin.  No sinus pain/pressure, nasal congestion.  Pt reports congestion is centered in the chest.  Has hx of RAD.  Denies wheezing.  Cough is loud, barking- unable to sleep.  Some SOB.  No fevers.  + sore throat from cough.  No ear pain.   Review of Systems For ROS see HPI     Objective:   Physical Exam  Vitals reviewed. Constitutional: She appears well-developed and well-nourished. No distress.  HENT:  Head: Normocephalic and atraumatic.  TMs normal bilaterally Mild nasal congestion Throat w/out erythema, edema, or exudate  Eyes: Conjunctivae and EOM are normal. Pupils are equal, round, and reactive to light.  Neck: Normal range of motion. Neck supple.  Cardiovascular: Normal rate, regular rhythm, normal heart sounds and intact distal pulses.   No murmur heard. Pulmonary/Chest: Effort normal and breath sounds normal. No respiratory distress. She has no wheezes.  + hacking cough Decreased air movement- improved s/p neb tx  Lymphadenopathy:    She has no cervical adenopathy.          Assessment & Plan:

## 2013-05-07 NOTE — Patient Instructions (Signed)
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

## 2013-05-07 NOTE — Assessment & Plan Note (Signed)
Recurrent issue.  Pt's cough and air movement improved s/p neb tx.  Start inhaled steroid for inflammation, albuterol prn.  Reviewed supportive care and red flags that should prompt return.  Pt expressed understanding and is in agreement w/ plan.

## 2013-05-08 ENCOUNTER — Telehealth: Payer: Self-pay | Admitting: *Deleted

## 2013-05-08 MED ORDER — PROMETHAZINE-DM 6.25-15 MG/5ML PO SYRP: 5.0000 mL | ORAL_SOLUTION | Freq: Four times a day (QID) | ORAL | Status: DC | PRN

## 2013-05-08 NOTE — Telephone Encounter (Signed)
Script sent for cough syrup- please notify pt

## 2013-05-08 NOTE — Telephone Encounter (Signed)
Patient notified

## 2013-05-08 NOTE — Telephone Encounter (Signed)
Patient called and stated that she was up all night coughing. Would like to know if she can have some type of cough syrup called into the pharmacy for her. Please advise. SW

## 2013-05-12 NOTE — Telephone Encounter (Signed)
error 

## 2013-06-18 ENCOUNTER — Other Ambulatory Visit: Payer: Self-pay | Admitting: Family Medicine

## 2013-06-19 NOTE — Telephone Encounter (Signed)
Med filled.  

## 2013-08-07 ENCOUNTER — Other Ambulatory Visit: Payer: Self-pay

## 2013-08-07 DIAGNOSIS — Z1231 Encounter for screening mammogram for malignant neoplasm of breast: Secondary | ICD-10-CM

## 2013-08-07 DIAGNOSIS — Z9011 Acquired absence of right breast and nipple: Secondary | ICD-10-CM

## 2013-08-20 ENCOUNTER — Ambulatory Visit: Payer: Managed Care, Other (non HMO) | Admitting: Family Medicine

## 2013-08-21 ENCOUNTER — Encounter: Payer: Self-pay | Admitting: Family Medicine

## 2013-08-21 ENCOUNTER — Ambulatory Visit (INDEPENDENT_AMBULATORY_CARE_PROVIDER_SITE_OTHER): Payer: Managed Care, Other (non HMO) | Admitting: Family Medicine

## 2013-08-21 VITALS — BP 120/72 | HR 104 | Temp 99.0°F | Resp 16 | Wt 306.0 lb

## 2013-08-21 DIAGNOSIS — M5416 Radiculopathy, lumbar region: Secondary | ICD-10-CM

## 2013-08-21 DIAGNOSIS — E119 Type 2 diabetes mellitus without complications: Secondary | ICD-10-CM

## 2013-08-21 DIAGNOSIS — IMO0002 Reserved for concepts with insufficient information to code with codable children: Secondary | ICD-10-CM

## 2013-08-21 LAB — BASIC METABOLIC PANEL
BUN: 17 mg/dL (ref 6–23)
CO2: 32 mEq/L (ref 19–32)
Calcium: 9.3 mg/dL (ref 8.4–10.5)
Chloride: 99 mEq/L (ref 96–112)
Creatinine, Ser: 0.9 mg/dL (ref 0.4–1.2)
GFR: 68.98 mL/min (ref >60.00)
Glucose, Bld: 117 mg/dL — ABNORMAL HIGH (ref 70–99)
Potassium: 3.4 mEq/L — ABNORMAL LOW (ref 3.5–5.1)
Sodium: 137 mEq/L (ref 135–145)

## 2013-08-21 LAB — HEMOGLOBIN A1C: Hgb A1c MFr Bld: 6.8 % — ABNORMAL HIGH (ref 4.6–6.5)

## 2013-08-21 MED ORDER — CYCLOBENZAPRINE HCL 10 MG PO TABS: 10.0000 mg | ORAL_TABLET | Freq: Three times a day (TID) | ORAL | Status: DC | PRN

## 2013-08-21 MED ORDER — HYDROCODONE-ACETAMINOPHEN 10-325 MG PO TABS: 1.0000 | ORAL_TABLET | Freq: Three times a day (TID) | ORAL | Status: DC | PRN

## 2013-08-21 MED ORDER — PREDNISONE 10 MG PO TABS: ORAL_TABLET | ORAL | Status: DC

## 2013-08-21 NOTE — Progress Notes (Signed)
   Subjective:    Patient ID: [name], female    DOB: [birth year], 58 y.o.   MRN: [id number]  HPI Back pain- sxs started 3 weeks ago but 'yesterday it just blew'.  Difficult for pt to drive home.  Pain improved when lying flat on ice back.  Worsens w/ movement.  Lumbar pain.  Pain is radiating into L buttock and thigh.  1 month ago slipped and fell in rain.  No fevers.  No bowel or bladder incontinence.  Pain described as a throbbing.  No weakness/numbness of legs.  + limp.  DM- chronic problem, on Metformin.  Due for labs.  On ARB.  Denies symptomatic lows.  No CP, SOB, HAs, visual changes, edema.   Review of Systems For ROS see HPI     Objective:   Physical Exam  Vitals reviewed. Constitutional: She is oriented to person, place, and time. She appears well-developed and well-nourished. No distress.  HENT:  Head: Normocephalic and atraumatic.  Eyes: Conjunctivae and EOM are normal. Pupils are equal, round, and reactive to light.  Neck: Normal range of motion. Neck supple. No thyromegaly present.  Cardiovascular: Normal rate, regular rhythm, normal heart sounds and intact distal pulses.   No murmur heard. Pulmonary/Chest: Effort normal and breath sounds normal. No respiratory distress.  Abdominal: Soft. She exhibits no distension. There is no tenderness.  Musculoskeletal: She exhibits edema (over L lumbar paraspinal muscles).  + TTP over lumbar spine Pain w/ flexion and extension of spine  Lymphadenopathy:    She has no cervical adenopathy.  Neurological: She is alert and oriented to person, place, and time.  + SLR on L at 90 degrees (-) SLR on R  Skin: Skin is warm and dry.  Psychiatric: She has a normal mood and affect. Her behavior is normal.          Assessment & Plan:

## 2013-08-21 NOTE — Patient Instructions (Signed)
Call me next week and let me know how the back is feeling Start the Prednisone taper Take the hydrocodone for severe pain Flexeril for spasm at night- will cause drowsiness We'll notify you of your lab results and make any changes if needed Call with any questions or concerns Hang in there!!!

## 2013-08-21 NOTE — Assessment & Plan Note (Signed)
Chronic problem.  On Metformin.  On ARB for renal protection.  Check labs.  Adjust meds prn.

## 2013-08-21 NOTE — Assessment & Plan Note (Signed)
New.  Pt very uncomfortable in office.  Start pred taper despite DM due to pt's severe discomfort.  Flexeril and Vicodin prn.  If no improvement in pain over the weekend will get MRI and refer.  Pt expressed understanding and is in agreement w/ plan.

## 2013-08-21 NOTE — Progress Notes (Signed)
Pre visit review using our clinic review tool, if applicable. No additional management support is needed unless otherwise documented below in the visit note. 

## 2013-08-25 ENCOUNTER — Telehealth: Payer: Self-pay | Admitting: Family Medicine

## 2013-08-25 DIAGNOSIS — M5416 Radiculopathy, lumbar region: Secondary | ICD-10-CM

## 2013-08-25 NOTE — Telephone Encounter (Signed)
Patient called and stated that she was suppose to update dr Birdie Riddle regarding her back pain. Patient is still in pain and would like to have a referral for a MRI. Please advise

## 2013-08-25 NOTE — Telephone Encounter (Signed)
Referral placed for [place] location, pt prefers a evening appt if possible.

## 2013-08-25 NOTE — Telephone Encounter (Signed)
[place] for MRI lumbar spine w/o contrast

## 2013-08-28 ENCOUNTER — Ambulatory Visit
Admission: RE | Admit: 2013-08-28 | Discharge: 2013-08-28 | Disposition: A | Discharge status: Home / Self Care | Payer: Managed Care, Other (non HMO) | Source: Ambulatory Visit | Attending: Family Medicine | Admitting: Family Medicine

## 2013-08-28 DIAGNOSIS — M5416 Radiculopathy, lumbar region: Secondary | ICD-10-CM

## 2013-08-28 MED ORDER — LIDOCAINE 5 % EX PTCH: 1.0000 | MEDICATED_PATCH | CUTANEOUS | Status: DC

## 2013-08-28 NOTE — Telephone Encounter (Signed)
Patient called and wanted to see if Dr Birdie Riddle would call her in a lidocaine patch for her pain. Please advise

## 2013-08-28 NOTE — Telephone Encounter (Signed)
Spoke with pt and she would like to try the lidocaine patch sent to [facility] on [address]. Also for referral she would prefer Dr. Harley [facility] office (I updated this referral)

## 2013-08-28 NOTE — Telephone Encounter (Signed)
Ok for lidocaine patch- apply 1 patch daily and remove after 12 hrs, #30, no refills (this is how it automatically comes up in the system)

## 2013-08-28 NOTE — Telephone Encounter (Signed)
Lidocaine will only make the area covered by the patch numb- it won't help the radiating or deep pain.  Please discuss this w/ her

## 2013-08-28 NOTE — Telephone Encounter (Signed)
Called and lmovm for pt to notify. Also placed a neurosurgery appt for pt based on the pain severity.

## 2013-08-28 NOTE — Telephone Encounter (Signed)
Med filled and pt notified.  

## 2013-09-01 ENCOUNTER — Ambulatory Visit: Payer: Managed Care, Other (non HMO)

## 2013-09-01 ENCOUNTER — Ambulatory Visit
Admission: RE | Admit: 2013-09-01 | Discharge: 2013-09-01 | Disposition: A | Discharge status: Home / Self Care | Payer: Managed Care, Other (non HMO) | Source: Ambulatory Visit

## 2013-09-01 ENCOUNTER — Ambulatory Visit: Payer: Managed Care, Other (non HMO) | Admitting: Family Medicine

## 2013-09-01 DIAGNOSIS — Z9011 Acquired absence of right breast and nipple: Secondary | ICD-10-CM

## 2013-09-01 DIAGNOSIS — Z1231 Encounter for screening mammogram for malignant neoplasm of breast: Secondary | ICD-10-CM

## 2013-09-02 ENCOUNTER — Other Ambulatory Visit: Payer: Managed Care, Other (non HMO)

## 2013-09-02 ENCOUNTER — Telehealth: Payer: Self-pay

## 2013-09-02 NOTE — Telephone Encounter (Signed)
Relevant patient education assigned to patient using Emmi. ° °

## 2013-09-03 ENCOUNTER — Encounter: Payer: Self-pay | Admitting: General Practice

## 2013-09-03 ENCOUNTER — Telehealth: Payer: Self-pay | Admitting: Family Medicine

## 2013-09-03 NOTE — Telephone Encounter (Signed)
Patient called to see if dr Birdie Riddle would put her out of work until she has her appointment with Neurosurgery next Thursday. Patient is in a lot of pain and states that she will come in and see Dr Birdie Riddle again if needed. Please advise.

## 2013-09-03 NOTE — Telephone Encounter (Signed)
Letter written and pt notified.

## 2013-09-03 NOTE — Telephone Encounter (Signed)
Ok to provide work note until neurosurgery appt

## 2013-09-09 ENCOUNTER — Telehealth: Payer: Self-pay | Admitting: *Deleted

## 2013-09-09 NOTE — Telephone Encounter (Signed)
Received FMLA paperwork via fax from [place]. Billing sheet attached, forms filled out as much as possible and placed in folder for Dr. Birdie Riddle. JG//CMA

## 2013-09-10 DIAGNOSIS — Z0279 Encounter for issue of other medical certificate: Secondary | ICD-10-CM

## 2013-09-10 NOTE — Telephone Encounter (Signed)
FMLA paperwork completed, signed by Dr. Birdie Riddle and faxed along with requested documentation to [phone].  Called and left message for patient informing her that papework has been completed, faxed to [facility] and a copy for her is up front for pick up. JG//CMA

## 2013-09-23 ENCOUNTER — Encounter: Payer: Self-pay | Admitting: Internal Medicine

## 2013-09-23 ENCOUNTER — Ambulatory Visit (INDEPENDENT_AMBULATORY_CARE_PROVIDER_SITE_OTHER): Payer: Managed Care, Other (non HMO) | Admitting: Internal Medicine

## 2013-09-23 VITALS — BP 115/68 | HR 100 | Temp 98.4°F | Wt 303.0 lb

## 2013-09-23 DIAGNOSIS — J209 Acute bronchitis, unspecified: Secondary | ICD-10-CM

## 2013-09-23 DIAGNOSIS — J019 Acute sinusitis, unspecified: Secondary | ICD-10-CM

## 2013-09-23 MED ORDER — ALBUTEROL SULFATE HFA 108 (90 BASE) MCG/ACT IN AERS: 2.0000 | INHALATION_SPRAY | RESPIRATORY_TRACT | Status: DC | PRN

## 2013-09-23 MED ORDER — SULFAMETHOXAZOLE-TMP DS 800-160 MG PO TABS: 1.0000 | ORAL_TABLET | Freq: Two times a day (BID) | ORAL | Status: DC

## 2013-09-23 MED ORDER — PREDNISONE 10 MG PO TABS: 10.0000 mg | ORAL_TABLET | Freq: Every day | ORAL | Status: DC

## 2013-09-23 NOTE — Patient Instructions (Signed)
Rest, fluids , tylenol  For cough, take Mucinex DM twice a day as needed  If the cough is severe at night, use hydrocodone  For congestion use OTC Flonase--- 2 nasal sprays on each side of the nose daily until you feel better  Take the antibiotic as prescribed  (bactrim  Call if no better in few days Call anytime if the symptoms are severe

## 2013-09-23 NOTE — Progress Notes (Signed)
Subjective:    Patient ID: Willette Alma, female    DOB: 08-05-1955, 58 y.o.   MRN: [id number]  DOS:  09/23/2013 Type of  visit: Acute  visit Symptoms started 2.5 weeks ago: Sinus pressure, sinus congestion, multicolor nasal discharge and postnasal dripping. In the last few days, she is also having dry cough. She has a history of bronchospasm, needs albuterol refill, occ wheezing in the last few days.   ROS No fever or chills No nausea, vomiting, diarrhea or myalgias. Occasionally anterior chest pain w/  cough, no difficulty breathing.   Past Medical History  Diagnosis Date  . GERD (gastroesophageal reflux disease)   . Depression   . Hypertension   . Anxiety   . Allergic rhinitis   . Osteoarthritis   . Breast cancer   . Viral meningitis   . Vitamin D deficiency   . Hyperparathyroidism   . Family history of breast cancer in first degree relative   . Colon polyps   . Diabetes mellitus   . Edema   . Obesity     Past Surgical History  Procedure Laterality Date  . Breast surgery      Tran flap due to breast cancer  . Knee arthroscopy      bilateral  . Parathyroid resection    . Total knee arthroplasty  06/18/08    Daldorf  . Mastectomy  01/13/94    Right breast  . Septoplasty  1980  . Tonsillectomy    . Parathyroidectomy    . Hand surgery      dog bite, right hand  . Hand surgery      trauma, left hand  . Tumor removal  1982    Right great toe  . Rectal abscess    . Cesarean section  1988  . Abdominal hysterectomy  1998  . Colonoscopy    . Adenoidectomy    . Left knee replacement  2010    History   Social History  . Marital Status: Widowed    Spouse Name: N/A    Number of Children: 1  . Years of Education: N/A   Occupational History  . [place] History Main Topics  . Smoking status: Never Smoker   . Smokeless tobacco: Never Used  . Alcohol Use: Yes     Comment: occ.  . Drug Use: No  . Sexual Activity: Not on file    Other Topics Concern  . Not on file   Social History Narrative  . No narrative on file        Medication List       This list is accurate as of: 09/23/13  5:21 PM.  Always use your most recent med list.               albuterol 108 (90 BASE) MCG/ACT inhaler  Commonly known as:  PROAIR HFA  Inhale 2 puffs into the lungs every 4 (four) hours as needed for wheezing or shortness of breath.     CALTRATE 600 1500 MG Tabs  Generic drug:  Calcium Carbonate  Take by mouth daily.     cyclobenzaprine 10 MG tablet  Commonly known as:  FLEXERIL  Take 1 tablet (10 mg total) by mouth 3 (three) times daily as needed for muscle spasms.     esomeprazole 40 MG capsule  Commonly known as:  NEXIUM  Take 1 capsule (40 mg total) by mouth daily.     ferrous sulfate  325 (65 FE) MG EC tablet  Take 325 mg by mouth daily.     FISH OIL PO  Take 1 capsule by mouth daily.     furosemide 40 MG tablet  Commonly known as:  LASIX  Take 1 tablet (40 mg total) by mouth 2 (two) times daily.     HYDROcodone-acetaminophen 10-325 MG per tablet  Commonly known as:  NORCO  Take 1 tablet by mouth every 8 (eight) hours as needed.     lidocaine 5 %  Commonly known as:  LIDODERM  Place 1 patch onto the skin daily. Remove & Discard patch within 12 hours or as directed by MD     metFORMIN 500 MG tablet  Commonly known as:  GLUCOPHAGE  TAKE 1 TABLET (500 MG TOTAL) BY MOUTH 2 (TWO) TIMES DAILY WITH A MEAL.     multivitamin tablet  Take 1 tablet by mouth daily.     POTASSIMIN PO  Take by mouth.     predniSONE 10 MG tablet  Commonly known as:  DELTASONE  Take 1 tablet (10 mg total) by mouth daily. 2 tabs a day x 5 days     sulfamethoxazole-trimethoprim 800-160 MG per tablet  Commonly known as:  BACTRIM DS  Take 1 tablet by mouth 2 (two) times daily.     telmisartan-hydrochlorothiazide 80-25 MG per tablet  Commonly known as:  MICARDIS HCT  TAKE 1 TABLET DAILY           Objective:    Physical Exam BP 115/68  Pulse 100  Temp(Src) 98.4 F (36.9 C)  Wt 303 lb (137.44 kg)  SpO2 96% General -- alert, well-developed, NAD.  HEENT--TMs R normal, L slt bulge, no d/c.  throat symmetric, no redness or discharge. Face symmetric, sinuses TTP at R frontal sinus area . Nose   congested.  Lungs -- normal respiratory effort, no intercostal retractions, no accessory muscle use, andronchi w/ cough, no wheezing or crackles  Heart-- normal rate, regular rhythm, no murmur.   Psych-- Cognition and judgment appear intact. Cooperative with normal attention span and concentration. No anxious or depressed appearing.     Assessment & Plan:    Sinusitis, mild bronchitis. Plan: see  instructions

## 2013-11-06 ENCOUNTER — Telehealth: Payer: Self-pay | Admitting: Family Medicine

## 2013-11-06 MED ORDER — FUROSEMIDE 40 MG PO TABS: 40.0000 mg | ORAL_TABLET | Freq: Two times a day (BID) | ORAL | Status: DC

## 2013-11-06 NOTE — Telephone Encounter (Signed)
Med filled.  

## 2013-11-06 NOTE — Telephone Encounter (Signed)
Caller name: Elleana Relation to pt: Call back [phone] Pharmacy: [facility]  Reason for call:  Pt is out of furosemide (LASIX) 40 MG tablet and [employer] denied the refill.  [facility] [address]  Please contact pt to let them know.  Thanks.

## 2013-11-24 ENCOUNTER — Encounter: Payer: Self-pay | Admitting: Family Medicine

## 2013-11-24 ENCOUNTER — Ambulatory Visit (INDEPENDENT_AMBULATORY_CARE_PROVIDER_SITE_OTHER): Payer: Managed Care, Other (non HMO) | Admitting: Family Medicine

## 2013-11-24 VITALS — BP 116/80 | HR 102 | Temp 98.8°F | Resp 16 | Wt 298.1 lb

## 2013-11-24 DIAGNOSIS — I10 Essential (primary) hypertension: Secondary | ICD-10-CM

## 2013-11-24 DIAGNOSIS — E119 Type 2 diabetes mellitus without complications: Secondary | ICD-10-CM

## 2013-11-24 NOTE — Progress Notes (Signed)
Pre visit review using our clinic review tool, if applicable. No additional management support is needed unless otherwise documented below in the visit note. 

## 2013-11-24 NOTE — Patient Instructions (Signed)
Schedule your complete physical in 3-4 months We'll notify you of your lab results and make any changes if needed Call Dr Foster Simpson office to get referred to Rheumatology Call with any questions or concerns You can do this! Happy 4th of July!!!

## 2013-11-24 NOTE — Progress Notes (Signed)
   Subjective:    Patient ID: [name], female    DOB: 12-06-1955, 58 y.o.   MRN: [id number]  HPI DM- chronic problem.  On Metformin.  Micardis HCT for renal protection.  Pt has lost 5 lbs since last visit.  Pt has eliminated soda from diet and is now drinking more water.  No longer swelling.  UTD on eye exam- January 2015.  Occasional symptomatic lows where she feels shaky/dizzy.  No CP, SOB, HAs, visual changes, edema.  No abd pain, N/V/D.  No numbness/tingling hands/feet  HTN- chronic problem, excellent control on Micardis HCT and Lasix.  Asymptomatic.     Review of Systems For ROS see HPI     Objective:   Physical Exam  Vitals reviewed. Constitutional: She is oriented to person, place, and time. She appears well-developed and well-nourished. No distress.  HENT:  Head: Normocephalic and atraumatic.  Eyes: Conjunctivae and EOM are normal. Pupils are equal, round, and reactive to light.  Neck: Normal range of motion. Neck supple. No thyromegaly present.  Cardiovascular: Normal rate, regular rhythm, normal heart sounds and intact distal pulses.   No murmur heard. Pulmonary/Chest: Effort normal and breath sounds normal. No respiratory distress.  Abdominal: Soft. She exhibits no distension. There is no tenderness.  Musculoskeletal: She exhibits no edema.  Lymphadenopathy:    She has no cervical adenopathy.  Neurological: She is alert and oriented to person, place, and time.  Skin: Skin is warm and dry.  Psychiatric: She has a normal mood and affect. Her behavior is normal.          Assessment & Plan:

## 2013-11-24 NOTE — Assessment & Plan Note (Signed)
Chronic problem.  Tolerating medication w/o difficulty.  UTD on eye exam.  On ARB for renal protection.  Has lost 5 lbs since last visit.  Check labs.  Adjust meds prn

## 2013-11-24 NOTE — Assessment & Plan Note (Signed)
Chronic problem.  Well controlled.  Asymptomatic.  Check labs.  Adjust meds prn

## 2013-11-25 LAB — LIPID PANEL
Cholesterol: 191 mg/dL (ref 0–200)
HDL: 48.1 mg/dL (ref >39.00)
LDL Cholesterol: 107 mg/dL — ABNORMAL HIGH (ref 0–99)
NonHDL: 142.9
Total CHOL/HDL Ratio: 4
Triglycerides: 179 mg/dL — ABNORMAL HIGH (ref 0.0–149.0)
VLDL: 35.8 mg/dL (ref 0.0–40.0)

## 2013-11-25 LAB — HEPATIC FUNCTION PANEL
ALT: 25 U/L (ref 0–35)
AST: 22 U/L (ref 0–37)
Albumin: 4.1 g/dL (ref 3.5–5.2)
Alkaline Phosphatase: 68 U/L (ref 39–117)
Bilirubin, Direct: 0.1 mg/dL (ref 0.0–0.3)
Total Bilirubin: 0.4 mg/dL (ref 0.2–1.2)
Total Protein: 7.9 g/dL (ref 6.0–8.3)

## 2013-11-25 LAB — BASIC METABOLIC PANEL
BUN: 15 mg/dL (ref 6–23)
CO2: 31 mEq/L (ref 19–32)
Calcium: 9.6 mg/dL (ref 8.4–10.5)
Chloride: 96 mEq/L (ref 96–112)
Creatinine, Ser: 1.1 mg/dL (ref 0.4–1.2)
GFR: 51.79 mL/min — ABNORMAL LOW (ref >60.00)
Glucose, Bld: 108 mg/dL — ABNORMAL HIGH (ref 70–99)
Potassium: 3.9 mEq/L (ref 3.5–5.1)
Sodium: 136 mEq/L (ref 135–145)

## 2013-11-25 LAB — HEMOGLOBIN A1C: Hgb A1c MFr Bld: 6.6 % — ABNORMAL HIGH (ref 4.6–6.5)

## 2013-12-08 ENCOUNTER — Encounter: Payer: Self-pay | Admitting: Family Medicine

## 2013-12-08 ENCOUNTER — Ambulatory Visit (INDEPENDENT_AMBULATORY_CARE_PROVIDER_SITE_OTHER): Payer: Managed Care, Other (non HMO) | Admitting: Family Medicine

## 2013-12-08 VITALS — BP 120/80 | HR 87 | Temp 98.0°F | Resp 16 | Ht 67.25 in | Wt 303.0 lb

## 2013-12-08 DIAGNOSIS — Z0000 Encounter for general adult medical examination without abnormal findings: Secondary | ICD-10-CM

## 2013-12-08 LAB — CBC WITH DIFFERENTIAL/PLATELET
Basophils Absolute: 0.2 10*3/uL — ABNORMAL HIGH (ref 0.0–0.1)
Basophils Relative: 2 % (ref 0.0–3.0)
Eosinophils Absolute: 0.1 10*3/uL (ref 0.0–0.7)
Eosinophils Relative: 0.8 % (ref 0.0–5.0)
HCT: 34.1 % — ABNORMAL LOW (ref 36.0–46.0)
Hemoglobin: 10.9 g/dL — ABNORMAL LOW (ref 12.0–15.0)
Lymphocytes Relative: 30.1 % (ref 12.0–46.0)
Lymphs Abs: 2.7 10*3/uL (ref 0.7–4.0)
MCHC: 32 g/dL (ref 30.0–36.0)
MCV: 85.1 fl (ref 78.0–100.0)
Monocytes Absolute: 0.8 10*3/uL (ref 0.1–1.0)
Monocytes Relative: 8.9 % (ref 3.0–12.0)
Neutro Abs: 5.3 10*3/uL (ref 1.4–7.7)
Neutrophils Relative %: 58.2 % (ref 43.0–77.0)
Platelets: 254 10*3/uL (ref 150.0–400.0)
RBC: 4.01 Mil/uL (ref 3.87–5.11)
RDW: 16.9 % — ABNORMAL HIGH (ref 11.5–15.5)
WBC: 9 10*3/uL (ref 4.0–10.5)

## 2013-12-08 NOTE — Patient Instructions (Signed)
Follow up as scheduled for the diabetes We'll notify you of your lab results and make any changes if needed Keep up the good work on healthy diet and regular exercise Call with any questions or concerns Enjoy the rest of your summer!!!

## 2013-12-08 NOTE — Progress Notes (Signed)
   Subjective:    Patient ID: [name], female    DOB: 24-Nov-1955, 58 y.o.   MRN: [id number]  HPI CPE- UTD on mammo, pap, colonoscopy.   Review of Systems Patient reports no vision/ hearing changes, adenopathy,fever, weight change,  persistant/recurrent hoarseness, swallowing issues, chest pain, palpitations, edema, hemoptysis, dyspnea (rest/exertional/paroxysmal nocturnal), gastrointestinal bleeding (melena, rectal bleeding), abdominal pain, significant heartburn, bowel changes, GU symptoms (dysuria, hematuria, incontinence), Gyn symptoms (abnormal  bleeding, pain),  syncope, focal weakness, memory loss, numbness & tingling, skin/hair/nail changes, abnormal bruising or bleeding, anxiety, or depression.  + cough, PND     Objective:   Physical Exam General Appearance:    Alert, cooperative, no distress, appears stated age, obese  Head:    Normocephalic, without obvious abnormality, atraumatic  Eyes:    PERRL, conjunctiva/corneas clear, EOM's intact, fundi    benign, both eyes  Ears:    Normal TM's and external ear canals, both ears  Nose:   Nares normal, septum midline, mucosa normal, no drainage    or sinus tenderness  Throat:   Lips, mucosa, and tongue normal; teeth and gums normal  Neck:   Supple, symmetrical, trachea midline, no adenopathy;    Thyroid: no enlargement/tenderness/nodules  Back:     Symmetric, no curvature, ROM normal, no CVA tenderness  Lungs:     Clear to auscultation bilaterally, respirations unlabored  Chest Wall:    No tenderness or deformity   Heart:    Regular rate and rhythm, S1 and S2 normal, no murmur, rub   or gallop  Breast Exam:    Deferred to GYN  Abdomen:     Soft, non-tender, bowel sounds active all four quadrants,    no masses, no organomegaly  Genitalia:    Deferred to GYN  Rectal:    Extremities:   Extremities normal, atraumatic, no cyanosis or edema  Pulses:   2+ and symmetric all extremities  Skin:   Skin color, texture, turgor normal,  no rashes or lesions  Lymph nodes:   Cervical, supraclavicular, and axillary nodes normal  Neurologic:   CNII-XII intact, normal strength, sensation and reflexes    throughout          Assessment & Plan:

## 2013-12-08 NOTE — Assessment & Plan Note (Signed)
Pt's PE WNL w/ exception of obesity.  UTD on health maintenance.  Check labs.  Anticipatory guidance provided.  

## 2013-12-08 NOTE — Progress Notes (Signed)
Pre visit review using our clinic review tool, if applicable. No additional management support is needed unless otherwise documented below in the visit note. 

## 2013-12-09 LAB — TSH: TSH: 0.88 u[IU]/mL (ref 0.35–4.50)

## 2013-12-09 LAB — VITAMIN D 25 HYDROXY (VIT D DEFICIENCY, FRACTURES): VITD: 35.98 ng/mL

## 2013-12-10 ENCOUNTER — Telehealth: Payer: Self-pay | Admitting: Family Medicine

## 2013-12-10 NOTE — Telephone Encounter (Signed)
Patient called stating that she did view her recent lab work on [employer] and will come by sometime today to pick up the stool card. Thanks

## 2013-12-10 NOTE — Telephone Encounter (Signed)
IFOB placed at front desk for pt.

## 2013-12-31 ENCOUNTER — Telehealth: Payer: Self-pay | Admitting: Family Medicine

## 2013-12-31 MED ORDER — ESOMEPRAZOLE MAGNESIUM 40 MG PO CPDR: 40.0000 mg | DELAYED_RELEASE_CAPSULE | Freq: Every day | ORAL | Status: DC

## 2013-12-31 NOTE — Telephone Encounter (Signed)
Caller name: Therma Relation to pt: Call back [phone] Pharmacy: [facility]  Reason for call:  Pt is requesting a refill on Rx esomeprazole (NEXIUM) 40 MG capsule.  thanks

## 2013-12-31 NOTE — Telephone Encounter (Signed)
Medication filled as requested to new pharmacy.

## 2014-01-19 ENCOUNTER — Ambulatory Visit (INDEPENDENT_AMBULATORY_CARE_PROVIDER_SITE_OTHER): Payer: Managed Care, Other (non HMO) | Admitting: Internal Medicine

## 2014-01-19 ENCOUNTER — Encounter: Payer: Self-pay | Admitting: General Practice

## 2014-01-19 ENCOUNTER — Encounter: Payer: Self-pay | Admitting: Internal Medicine

## 2014-01-19 VITALS — BP 112/68 | HR 93 | Temp 98.4°F | Wt 305.1 lb

## 2014-01-19 DIAGNOSIS — J069 Acute upper respiratory infection, unspecified: Secondary | ICD-10-CM

## 2014-01-19 MED ORDER — HYDROCODONE-HOMATROPINE 5-1.5 MG/5ML PO SYRP: 5.0000 mL | ORAL_SOLUTION | Freq: Every evening | ORAL | Status: DC | PRN

## 2014-01-19 MED ORDER — AZITHROMYCIN 250 MG PO TABS
ORAL_TABLET | ORAL | Status: DC
Start: 2014-01-19 — End: 2014-03-09

## 2014-01-19 MED ORDER — ALBUTEROL SULFATE HFA 108 (90 BASE) MCG/ACT IN AERS: 2.0000 | INHALATION_SPRAY | RESPIRATORY_TRACT | Status: DC | PRN

## 2014-01-19 NOTE — Progress Notes (Signed)
Pre-visit discussion using our clinic review tool. No additional management support is needed unless otherwise documented below in the visit note.  

## 2014-01-19 NOTE — Progress Notes (Signed)
Subjective:    Patient ID: [name], female    DOB: 1955-07-20, 58 y.o.   MRN: [id number]  DOS:  01/19/2014 Type of visit - description: acute History: Started to feel unwell 2 days ago but yesterday at night developed  severe cough, chest congestion and tightness. She has a history of seasonal asthma, out of albuterol for a while back. Her daughter had similar symptoms last week. Unable to sleep due to cough  ROS Denies fever chills Some sinus pain and congestion. No nausea, vomiting, diarrhea. Minimal sputum production, white in color  Past Medical History  Diagnosis Date  . GERD (gastroesophageal reflux disease)   . Depression   . Hypertension   . Anxiety   . Allergic rhinitis   . Osteoarthritis   . Breast cancer   . Viral meningitis   . Vitamin D deficiency   . Hyperparathyroidism   . Family history of breast cancer in first degree relative   . Colon polyps   . Diabetes mellitus   . Edema   . Obesity     Past Surgical History  Procedure Laterality Date  . Breast surgery      Tran flap due to breast cancer  . Knee arthroscopy      bilateral  . Parathyroid resection    . Total knee arthroplasty  06/18/08    Daldorf  . Mastectomy  01/13/94    Right breast  . Septoplasty  1980  . Tonsillectomy    . Parathyroidectomy    . Hand surgery      dog bite, right hand  . Hand surgery      trauma, left hand  . Tumor removal  1982    Right great toe  . Rectal abscess    . Cesarean section  1988  . Abdominal hysterectomy  1998  . Colonoscopy    . Adenoidectomy    . Left knee replacement  2010    History   Social History  . Marital Status: Widowed    Spouse Name: N/A    Number of Children: 1  . Years of Education: N/A   Occupational History  . [place] History Main Topics  . Smoking status: Never Smoker   . Smokeless tobacco: Never Used  . Alcohol Use: Yes     Comment: occ.  . Drug Use: No  . Sexual Activity: Not on  file   Other Topics Concern  . Not on file   Social History Narrative  . No narrative on file        Medication List       This list is accurate as of: 01/19/14  1:01 PM.  Always use your most recent med list.               albuterol 108 (90 BASE) MCG/ACT inhaler  Commonly known as:  PROAIR HFA  Inhale 2 puffs into the lungs every 4 (four) hours as needed for wheezing or shortness of breath.     azithromycin 250 MG tablet  Commonly known as:  ZITHROMAX Z-PAK  2 tabs first day, then 1 tab a day     CALTRATE 600 1500 MG Tabs  Generic drug:  Calcium Carbonate  Take by mouth daily.     esomeprazole 40 MG capsule  Commonly known as:  NEXIUM  Take 1 capsule (40 mg total) by mouth daily.     ferrous sulfate 325 (65 FE) MG EC tablet  Take 325 mg by mouth daily.     FISH OIL PO  Take 1 capsule by mouth daily.     furosemide 40 MG tablet  Commonly known as:  LASIX  Take 1 tablet (40 mg total) by mouth 2 (two) times daily.     HYDROcodone-homatropine 5-1.5 MG/5ML syrup  Commonly known as:  HYCODAN  Take 5 mLs by mouth at bedtime as needed for cough.     lidocaine 5 %  Commonly known as:  LIDODERM  Place 1 patch onto the skin daily. Remove & Discard patch within 12 hours or as directed by MD     metFORMIN 500 MG tablet  Commonly known as:  GLUCOPHAGE  TAKE 1 TABLET (500 MG TOTAL) BY MOUTH 2 (TWO) TIMES DAILY WITH A MEAL.     multivitamin tablet  Take 1 tablet by mouth daily.     POTASSIMIN PO  Take by mouth.     telmisartan-hydrochlorothiazide 80-25 MG per tablet  Commonly known as:  MICARDIS HCT  TAKE 1 TABLET DAILY           Objective:   Physical Exam BP 112/68  Pulse 93  Temp(Src) 98.4 F (36.9 C) (Oral)  Wt 305 lb 2 oz (138.404 kg)  SpO2 96%  General -- alert, well-developed, NAD.  HEENT-- Not pale. TMs normal, throat symmetric, no redness or discharge. Face symmetric, sinuses mildly  tender to palpation throughout. Nose  congested.  Lungs --  normal respiratory effort, no intercostal retractions, no accessory muscle use, and few ronchi w/ cough and prolonged exp time  Heart-- normal rate, regular rhythm, no murmur.  Neurologic--  alert & oriented X3. Speech normal, gait appropriate for age, strength symmetric and appropriate for age.  Psych-- Cognition and judgment appear intact. Cooperative with normal attention span and concentration. No anxious or depressed appearing.      Assessment & Plan:   URI, bronchospasm: Patient has a history of seasonal asthma, presents with URI and "chest tightness". Plan: Symptomatic treatment with Mucinex, hydrocodone, albuterol. If  not improving soon, start a zpack , see instructions

## 2014-01-19 NOTE — Patient Instructions (Signed)
Rest, fluids , tylenol For cough, take Mucinex DM twice a day as needed  Hydrocodone only for severe cough at night  For congestion use OTC Nasocort: 2 nasal sprays on each side of the nose daily until you feel better Use the inhaler if wheezing or chest congestion Take the antibiotic as prescribed  (zithromax) if no better in few days  Call if no better in few days Call anytime if the symptoms are severe

## 2014-03-09 ENCOUNTER — Ambulatory Visit (INDEPENDENT_AMBULATORY_CARE_PROVIDER_SITE_OTHER): Payer: Managed Care, Other (non HMO) | Admitting: Family Medicine

## 2014-03-09 ENCOUNTER — Encounter: Payer: Self-pay | Admitting: Family Medicine

## 2014-03-09 VITALS — BP 140/88 | HR 82 | Temp 98.4°F | Resp 17 | Wt 307.2 lb

## 2014-03-09 DIAGNOSIS — J0110 Acute frontal sinusitis, unspecified: Secondary | ICD-10-CM

## 2014-03-09 DIAGNOSIS — E119 Type 2 diabetes mellitus without complications: Secondary | ICD-10-CM

## 2014-03-09 LAB — BASIC METABOLIC PANEL
BUN: 15 mg/dL (ref 6–23)
CO2: 25 mEq/L (ref 19–32)
Calcium: 9.5 mg/dL (ref 8.4–10.5)
Chloride: 110 mEq/L (ref 96–112)
Creatinine, Ser: 1 mg/dL (ref 0.4–1.2)
GFR: 59.5 mL/min — ABNORMAL LOW (ref >60.00)
Glucose, Bld: 109 mg/dL — ABNORMAL HIGH (ref 70–99)
Potassium: 4.1 mEq/L (ref 3.5–5.1)
Sodium: 148 mEq/L — ABNORMAL HIGH (ref 135–145)

## 2014-03-09 LAB — HEMOGLOBIN A1C: Hgb A1c MFr Bld: 6.4 % (ref 4.6–6.5)

## 2014-03-09 MED ORDER — SULFAMETHOXAZOLE-TMP DS 800-160 MG PO TABS: 1.0000 | ORAL_TABLET | Freq: Two times a day (BID) | ORAL | Status: DC

## 2014-03-09 MED ORDER — PROMETHAZINE-DM 6.25-15 MG/5ML PO SYRP: 5.0000 mL | ORAL_SOLUTION | Freq: Four times a day (QID) | ORAL | Status: DC | PRN

## 2014-03-09 NOTE — Progress Notes (Signed)
Pre visit review using our clinic review tool, if applicable. No additional management support is needed unless otherwise documented below in the visit note. 

## 2014-03-09 NOTE — Patient Instructions (Signed)
Follow up in 3-4 months to recheck DM and cholesterol Schedule a nurse visit after you finish your antibiotics and get the Pneumovax We'll notify you of your lab results and make any changes if needed Start the Bactrim as directed- take w/ food Drink plenty of fluids REST! Use the cough syrup as needed for nights/weekends- will cause drowsiness Mucinex DM for daytime cough Call with any questions or concerns Hang in there!!

## 2014-03-09 NOTE — Assessment & Plan Note (Signed)
Pt's sxs and PE consistent w/ infxn.  Hx of similar.  Low threshold to start abx due to DM.  Start meds.  Cough syrup prn.  Reviewed supportive care and red flags that should prompt return.  Pt expressed understanding and is in agreement w/ plan.

## 2014-03-09 NOTE — Assessment & Plan Note (Signed)
Chronic problem for pt.  UTD on eye exam.  Foot exam done today.  On ARB for renal protection.  Asymptomatic.  Check labs.  Adjust meds prn

## 2014-03-09 NOTE — Progress Notes (Signed)
   Subjective:    Patient ID: [name], female    DOB: 1955/11/10, 58 y.o.   MRN: [id number]  HPI DM- chronic problem, on Metformin.  On ARB for renal protection.  Pt is in the process of gastric bypass- currently involved w/ nutritionist, psych eval, exercise specialist.  Pt is planning Roux-en-y w/ Dr Volanda Napoleon at [facility].  Denies CP, SOB, HAs, visual changes, abd pain, N/V/D, edema.  UTD on eye exam (Jan 2015).  Denies symptomatic lows.  URI- pt reports severe sinus pain/pressure, HA, nasal congestion cough.  Started last week.  No fevers.  + sick contacts.   Review of Systems For ROS see HPI     Objective:   Physical Exam  Vitals reviewed. Constitutional: She appears well-developed and well-nourished. No distress.  HENT:  Head: Normocephalic and atraumatic.  Right Ear: Tympanic membrane normal.  Left Ear: Tympanic membrane normal.  Nose: Mucosal edema and rhinorrhea present. Right sinus exhibits frontal sinus tenderness. Right sinus exhibits no maxillary sinus tenderness. Left sinus exhibits frontal sinus tenderness. Left sinus exhibits no maxillary sinus tenderness.  Mouth/Throat: Uvula is midline and mucous membranes are normal. Posterior oropharyngeal erythema present. No oropharyngeal exudate.  Eyes: Conjunctivae and EOM are normal. Pupils are equal, round, and reactive to light.  Neck: Normal range of motion. Neck supple.  Cardiovascular: Normal rate, regular rhythm and normal heart sounds.   Pulmonary/Chest: Effort normal and breath sounds normal. No respiratory distress. She has no wheezes.  Hacking cough  Lymphadenopathy:    She has no cervical adenopathy.          Assessment & Plan:

## 2014-03-10 ENCOUNTER — Other Ambulatory Visit: Payer: Self-pay | Admitting: Family Medicine

## 2014-03-10 NOTE — Telephone Encounter (Signed)
Med filled.  

## 2014-03-12 ENCOUNTER — Telehealth: Payer: Self-pay | Admitting: Family Medicine

## 2014-03-12 MED ORDER — TELMISARTAN-HCTZ 80-25 MG PO TABS: ORAL_TABLET | ORAL | Status: DC

## 2014-03-12 NOTE — Telephone Encounter (Signed)
Caller name: Jaclene  Call back number:[phone] Pharmacy: [facility]   Reason for call:  Pt's mail order pharmacy denied the Rx telmisartan-hydrochlorothiazide (MICARDIS HCT) 80-25 MG per tablet even though it shows it was sent on 10/13.  She would [facility]  Call if we can do this.

## 2014-03-12 NOTE — Telephone Encounter (Signed)
Med filled.  

## 2014-03-30 ENCOUNTER — Telehealth: Payer: Self-pay | Admitting: *Deleted

## 2014-03-30 ENCOUNTER — Encounter: Payer: Self-pay | Admitting: Family Medicine

## 2014-03-30 NOTE — Telephone Encounter (Signed)
Prior authorization for Esomeprazole initiated. Awaiting determination. JG//CMA

## 2014-03-31 ENCOUNTER — Other Ambulatory Visit: Payer: Self-pay | Admitting: Obstetrics and Gynecology

## 2014-03-31 NOTE — Telephone Encounter (Signed)
PA approved effective 03/30/2014 through 03/30/2017. Approval letter sent for scanning. JG//CMA

## 2014-04-01 ENCOUNTER — Telehealth: Payer: Self-pay | Admitting: Family Medicine

## 2014-04-01 NOTE — Telephone Encounter (Signed)
Pt should get pneumovax 1st and the shingles vaccine at least 30 days after

## 2014-04-01 NOTE — Telephone Encounter (Signed)
Pt has appt for a pnuemonia inj on 04/03/14, pt would like to know if Dr. Birdie Riddle suggest she gets a shingles vaccine, pt states her insurance will pay 100% and if she can get them both at the same time on Friday.

## 2014-04-01 NOTE — Telephone Encounter (Signed)
Pt.notified

## 2014-04-02 LAB — CYTOLOGY - PAP

## 2014-04-03 ENCOUNTER — Telehealth: Payer: Self-pay | Admitting: *Deleted

## 2014-04-03 ENCOUNTER — Ambulatory Visit (INDEPENDENT_AMBULATORY_CARE_PROVIDER_SITE_OTHER): Payer: Managed Care, Other (non HMO) | Admitting: *Deleted

## 2014-04-03 DIAGNOSIS — Z23 Encounter for immunization: Secondary | ICD-10-CM

## 2014-04-03 NOTE — Telephone Encounter (Signed)
Pt is in the process of getting [employer] insurance - [place] [employer]. Pt states that the insurance company is requesting all her medical records.

## 2014-04-03 NOTE — Telephone Encounter (Signed)
Pt would need to complete a medical record release and beth can fax to records dept.

## 2014-04-03 NOTE — Telephone Encounter (Signed)
Pt states will come by today to fill out form .

## 2014-05-04 ENCOUNTER — Other Ambulatory Visit: Payer: Self-pay | Admitting: General Practice

## 2014-05-04 MED ORDER — FUROSEMIDE 40 MG PO TABS: 40.0000 mg | ORAL_TABLET | Freq: Two times a day (BID) | ORAL | Status: DC

## 2014-05-12 ENCOUNTER — Encounter: Payer: Self-pay | Admitting: Family Medicine

## 2014-05-12 ENCOUNTER — Ambulatory Visit (INDEPENDENT_AMBULATORY_CARE_PROVIDER_SITE_OTHER): Payer: Managed Care, Other (non HMO) | Admitting: Family Medicine

## 2014-05-12 VITALS — BP 130/76 | HR 107 | Temp 98.1°F | Resp 16 | Wt 304.5 lb

## 2014-05-12 DIAGNOSIS — R6884 Jaw pain: Secondary | ICD-10-CM

## 2014-05-12 MED ORDER — MELOXICAM 15 MG PO TABS: 15.0000 mg | ORAL_TABLET | Freq: Every day | ORAL | Status: DC

## 2014-05-12 NOTE — Progress Notes (Signed)
   Subjective:    Patient ID: [name], female    DOB: 1955/06/28, 58 y.o.   MRN: [id number]  HPI L jaw pain- sxs started ~6 weeks ago.  Every time pt opens mouth to talk or chew, jaw will pop.  Very painful.  Unable to open jaw to eat or speak w/o difficulty.  Jaw is now swelling and causing pain and pressure in L ear.  Saw dentist who prescribed Hydrocodone, Flexeril, and Valium.  Has been taking Flexeril x4 weeks w/o relief.     Review of Systems For ROS see HPI     Objective:   Physical Exam  Constitutional: She is oriented to person, place, and time. She appears well-developed and well-nourished. No distress.  HENT:  Head: Normocephalic and atraumatic.  TMs WNL bilaterally + TTP over L TMJ Limited jaw movement due to pain  Neurological: She is alert and oriented to person, place, and time. No cranial nerve deficit.  Skin: Skin is warm and dry. No erythema.  Psychiatric: She has a normal mood and affect. Her behavior is normal.  Vitals reviewed.         Assessment & Plan:

## 2014-05-12 NOTE — Patient Instructions (Signed)
Follow up as scheduled/as needed Start the Mobic daily- take w/ food Start the Valium at night in place of the flexeril Alternate heat/ice on jaw We'll call you with your jaw specialist appt Call with any questions or concerns Happy Holidays!!

## 2014-05-12 NOTE — Assessment & Plan Note (Signed)
New.  Agree w/ either valium or flexeril but not both.  Start daily NSAID.  Refer to OMFS.  Pt expressed understanding and is in agreement w/ plan.

## 2014-05-12 NOTE — Progress Notes (Signed)
Pre visit review using our clinic review tool, if applicable. No additional management support is needed unless otherwise documented below in the visit note. 

## 2014-05-29 LAB — HM DIABETES EYE EXAM

## 2014-06-21 ENCOUNTER — Encounter (HOSPITAL_BASED_OUTPATIENT_CLINIC_OR_DEPARTMENT_OTHER): Payer: Self-pay

## 2014-06-21 ENCOUNTER — Emergency Department (HOSPITAL_BASED_OUTPATIENT_CLINIC_OR_DEPARTMENT_OTHER): Payer: BLUE CROSS/BLUE SHIELD

## 2014-06-21 ENCOUNTER — Emergency Department (HOSPITAL_BASED_OUTPATIENT_CLINIC_OR_DEPARTMENT_OTHER)
Admission: EM | Admit: 2014-06-21 | Discharge: 2014-06-21 | Disposition: A | Discharge status: Home / Self Care | Payer: BLUE CROSS/BLUE SHIELD | Attending: Emergency Medicine | Admitting: Emergency Medicine

## 2014-06-21 DIAGNOSIS — Z792 Long term (current) use of antibiotics: Secondary | ICD-10-CM | POA: Diagnosis not present

## 2014-06-21 DIAGNOSIS — Z79899 Other long term (current) drug therapy: Secondary | ICD-10-CM | POA: Insufficient documentation

## 2014-06-21 DIAGNOSIS — E669 Obesity, unspecified: Secondary | ICD-10-CM | POA: Diagnosis not present

## 2014-06-21 DIAGNOSIS — Z791 Long term (current) use of non-steroidal anti-inflammatories (NSAID): Secondary | ICD-10-CM | POA: Insufficient documentation

## 2014-06-21 DIAGNOSIS — Z86010 Personal history of colonic polyps: Secondary | ICD-10-CM | POA: Diagnosis not present

## 2014-06-21 DIAGNOSIS — Z853 Personal history of malignant neoplasm of breast: Secondary | ICD-10-CM | POA: Diagnosis not present

## 2014-06-21 DIAGNOSIS — E119 Type 2 diabetes mellitus without complications: Secondary | ICD-10-CM | POA: Insufficient documentation

## 2014-06-21 DIAGNOSIS — K219 Gastro-esophageal reflux disease without esophagitis: Secondary | ICD-10-CM | POA: Insufficient documentation

## 2014-06-21 DIAGNOSIS — Z880 Allergy status to penicillin: Secondary | ICD-10-CM | POA: Insufficient documentation

## 2014-06-21 DIAGNOSIS — Z8659 Personal history of other mental and behavioral disorders: Secondary | ICD-10-CM | POA: Diagnosis not present

## 2014-06-21 DIAGNOSIS — J209 Acute bronchitis, unspecified: Secondary | ICD-10-CM | POA: Diagnosis not present

## 2014-06-21 DIAGNOSIS — I10 Essential (primary) hypertension: Secondary | ICD-10-CM | POA: Diagnosis not present

## 2014-06-21 DIAGNOSIS — Z8619 Personal history of other infectious and parasitic diseases: Secondary | ICD-10-CM | POA: Insufficient documentation

## 2014-06-21 DIAGNOSIS — R0602 Shortness of breath: Secondary | ICD-10-CM | POA: Diagnosis present

## 2014-06-21 DIAGNOSIS — Z8739 Personal history of other diseases of the musculoskeletal system and connective tissue: Secondary | ICD-10-CM | POA: Diagnosis not present

## 2014-06-21 DIAGNOSIS — E559 Vitamin D deficiency, unspecified: Secondary | ICD-10-CM | POA: Diagnosis not present

## 2014-06-21 MED ORDER — ALBUTEROL SULFATE HFA 108 (90 BASE) MCG/ACT IN AERS: 2.0000 | INHALATION_SPRAY | RESPIRATORY_TRACT | Status: DC | PRN

## 2014-06-21 MED ORDER — ALBUTEROL SULFATE HFA 108 (90 BASE) MCG/ACT IN AERS
2.0000 | INHALATION_SPRAY | RESPIRATORY_TRACT | Status: DC | PRN
  Administered 2014-06-21: 2 via RESPIRATORY_TRACT
  Filled 2014-06-21 (×2): qty 6.7

## 2014-06-21 MED ORDER — ALBUTEROL SULFATE (2.5 MG/3ML) 0.083% IN NEBU
5.0000 mg | INHALATION_SOLUTION | Freq: Once | RESPIRATORY_TRACT | Status: AC
  Administered 2014-06-21: 5 mg via RESPIRATORY_TRACT
  Filled 2014-06-21: qty 6

## 2014-06-21 MED ORDER — ALBUTEROL SULFATE (2.5 MG/3ML) 0.083% IN NEBU
2.5000 mg | INHALATION_SOLUTION | Freq: Once | RESPIRATORY_TRACT | Status: AC
  Administered 2014-06-21: 2.5 mg via RESPIRATORY_TRACT
  Filled 2014-06-21: qty 3

## 2014-06-21 MED ORDER — IPRATROPIUM-ALBUTEROL 0.5-2.5 (3) MG/3ML IN SOLN
3.0000 mL | Freq: Once | RESPIRATORY_TRACT | Status: AC
  Administered 2014-06-21: 3 mL via RESPIRATORY_TRACT
  Filled 2014-06-21: qty 3

## 2014-06-21 MED ORDER — DOXYCYCLINE HYCLATE 100 MG PO CAPS: 100.0000 mg | ORAL_CAPSULE | Freq: Two times a day (BID) | ORAL | Status: DC

## 2014-06-21 MED ORDER — PREDNISONE 20 MG PO TABS: ORAL_TABLET | ORAL | Status: DC

## 2014-06-21 MED ORDER — ALBUTEROL SULFATE (2.5 MG/3ML) 0.083% IN NEBU: 2.5000 mg | INHALATION_SOLUTION | RESPIRATORY_TRACT | Status: DC | PRN

## 2014-06-21 MED ORDER — HYDROCODONE-HOMATROPINE 5-1.5 MG/5ML PO SYRP: 5.0000 mL | ORAL_SOLUTION | Freq: Four times a day (QID) | ORAL | Status: DC | PRN

## 2014-06-21 MED ORDER — PREDNISONE 50 MG PO TABS
60.0000 mg | ORAL_TABLET | Freq: Once | ORAL | Status: AC
  Administered 2014-06-21: 60 mg via ORAL
  Filled 2014-06-21 (×2): qty 1

## 2014-06-21 NOTE — ED Notes (Signed)
Pt with cold symptoms for 2 days, cough and sob worse today, last albuterol @ 1700.

## 2014-06-21 NOTE — Discharge Instructions (Signed)

## 2014-06-21 NOTE — Patient Instructions (Signed)
Instructed patient on the proper use of administering albuterol mdi via aerochamber patient tolerated well 

## 2014-06-21 NOTE — ED Provider Notes (Signed)
CSN: [id number]     Arrival date & time 06/21/14  1839 History  This chart was scribed for Orpah Greek, * by Peyton Bottoms, ED Scribe. This patient was seen in room MH12/MH12 and the patient's care was started at 6:57 PM.   Chief Complaint  Patient presents with  . Shortness of Breath   Patient is a 59 y.o. female presenting with shortness of breath. The history is provided by the patient. No language interpreter was used.  Shortness of Breath Severity:  Moderate Onset quality:  Gradual Duration:  2 days Timing:  Constant Progression:  Waxing and waning Chronicity:  New Associated symptoms: cough    HPI Comments: [name] is a 59 y.o. female with a PMHx of allergic rhinitis, seasonal asthma, GERD, hypertension, obesity and hyperparathyroidism, who presents to the Emergency Department complaining of moderate SOB that began 2 days ago. She also reports associated cough and congestion. Patient states that she uses an inhaler at home. She states that she felt fine during the day, but had onset of cough and SOB last night. She denies associated fever. She states that she was prescribed doxycycline by dentist and she states that she took 1 dose of doxycycline. She states her most recent albuterol breathing treatment was at 5:00 PM.  Past Medical History  Diagnosis Date  . GERD (gastroesophageal reflux disease)   . Depression   . Hypertension   . Anxiety   . Allergic rhinitis   . Osteoarthritis   . Breast cancer   . Viral meningitis   . Vitamin D deficiency   . Hyperparathyroidism   . Family history of breast cancer in first degree relative   . Colon polyps   . Diabetes mellitus   . Edema   . Obesity    Past Surgical History  Procedure Laterality Date  . Breast surgery      Tran flap due to breast cancer  . Knee arthroscopy      bilateral  . Parathyroid resection    . Total knee arthroplasty  06/18/08    [facility]  . Mastectomy  01/13/94    Right breast  .  Septoplasty  1980  . Tonsillectomy    . Parathyroidectomy    . Hand surgery      dog bite, right hand  . Hand surgery      trauma, left hand  . Tumor removal  1982    Right great toe  . Rectal abscess    . Cesarean section  1988  . Abdominal hysterectomy  1998  . Colonoscopy    . Adenoidectomy    . Left knee replacement  2010   Family History  Problem Relation Age of Onset  . Breast cancer    . Ovarian cancer    . Colon polyps    . Emphysema Father   . Lung cancer Father   . Breast cancer Maternal Aunt   . Colon cancer Maternal Aunt   . Breast cancer Paternal Grandfather   . Heart disease Paternal Grandfather   . Prostate cancer Paternal Grandfather   . Asthma Daughter     "seasonal"  . Arthritis Maternal Grandmother    History  Substance Use Topics  . Smoking status: Never Smoker   . Smokeless tobacco: Never Used  . Alcohol Use: Yes     Comment: occ.   OB History    Gravida Para Term Preterm AB TAB SAB Ectopic Multiple Living   1 1  Review of Systems  Respiratory: Positive for cough and shortness of breath.   All other systems reviewed and are negative.  Allergies  Penicillins  Home Medications   Prior to Admission medications   Medication Sig Start Date End Date Taking? Authorizing Provider  doxycycline (DORYX) 100 MG DR capsule Take 100 mg by mouth 2 (two) times daily.   Yes Historical Provider, MD  albuterol (PROAIR HFA) 108 (90 BASE) MCG/ACT inhaler Inhale 2 puffs into the lungs every 4 (four) hours as needed for wheezing or shortness of breath. 01/19/14   Colon Branch, MD  Calcium Carbonate (CALTRATE 600) 1500 MG TABS Take by mouth daily.      Historical Provider, MD  cyclobenzaprine (FLEXERIL) 10 MG tablet  04/07/14   Historical Provider, MD  esomeprazole (NEXIUM) 40 MG capsule Take 1 capsule (40 mg total) by mouth daily. 12/31/13   Midge Minium, MD  ferrous sulfate 325 (65 FE) MG EC tablet Take 325 mg by mouth daily.    Historical  Provider, MD  furosemide (LASIX) 40 MG tablet Take 1 tablet (40 mg total) by mouth 2 (two) times daily. 05/04/14 08/09/15  Midge Minium, MD  meloxicam (MOBIC) 15 MG tablet Take 1 tablet (15 mg total) by mouth daily. 05/12/14   Midge Minium, MD  metFORMIN (GLUCOPHAGE) 500 MG tablet TAKE 1 TABLET (500 MG TOTAL) BY MOUTH 2 (TWO) TIMES DAILY WITH A MEAL. 06/18/13   Midge Minium, MD  Multiple Vitamin (MULTIVITAMIN) tablet Take 1 tablet by mouth daily.      Historical Provider, MD  Omega-3 Fatty Acids (FISH OIL PO) Take 1 capsule by mouth daily.    Historical Provider, MD  Potassium (POTASSIMIN PO) Take by mouth.    Historical Provider, MD  telmisartan-hydrochlorothiazide (MICARDIS HCT) 80-25 MG per tablet TAKE 1 TABLET DAILY 03/12/14   Midge Minium, MD   Triage Vitals: BP 150/81 mmHg  Pulse 117  Temp(Src) 100 F (37.8 C) (Oral)  Resp 26  Ht 5\' 7"  (1.702 m)  Wt 304 lb (137.893 kg)  BMI 47.60 kg/m2  SpO2 97%  Physical Exam  Constitutional: She is oriented to person, place, and time. She appears well-developed and well-nourished. No distress.  HENT:  Head: Normocephalic and atraumatic.  Right Ear: Hearing normal.  Left Ear: Hearing normal.  Nose: Nose normal.  Mouth/Throat: Oropharynx is clear and moist and mucous membranes are normal.  Eyes: Conjunctivae and EOM are normal. Pupils are equal, round, and reactive to light.  Neck: Normal range of motion. Neck supple.  Cardiovascular: Regular rhythm, S1 normal and S2 normal.  Exam reveals no gallop and no friction rub.   No murmur heard. Pulmonary/Chest: Effort normal. No respiratory distress. She has wheezes. She exhibits no tenderness.  Slight wheezing bilaterally.  Abdominal: Soft. Normal appearance and bowel sounds are normal. There is no hepatosplenomegaly. There is no tenderness. There is no rebound, no guarding, no tenderness at McBurney's point and negative Murphy's sign. No hernia.  Musculoskeletal: Normal range  of motion.  Neurological: She is alert and oriented to person, place, and time. She has normal strength. No cranial nerve deficit or sensory deficit. Coordination normal. GCS eye subscore is 4. GCS verbal subscore is 5. GCS motor subscore is 6.  Skin: Skin is warm, dry and intact. No rash noted. No cyanosis.  Psychiatric: She has a normal mood and affect. Her speech is normal and behavior is normal. Thought content normal.  Nursing note and vitals reviewed.  ED Course  Procedures (including critical care time)  DIAGNOSTIC STUDIES: Oxygen Saturation is 97% on RA, normal by my interpretation.    COORDINATION OF CARE: 7:00 PM- Gave patient Duoneb and albuterol breathing treatment. Discussed plans to monitor patient for the next couple hours. Pt advised of plan for treatment and pt agrees.  Labs Review Labs Reviewed - No data to display  Imaging Review No results found.   EKG Interpretation None     MDM   Final diagnoses:  None  Bronchitis  To the ER for evaluation of cough and wheezing. Patient reports that she has intermittent seasonal asthma. She has been out of her bronchodilator medication, however. Patient did start doxycycline today on her own, had some leftover from previous illness. She started to have some feelings like her throat was closing earlier, became concerned and it was allergic reaction. There is nothing to indicate interreaction at this time. Patient administered albuterol nebulizers here in the ER with significant improvement, now completely clear without bronchospasm. She feels much improved. Chest x-ray did not show pneumonia. Continue cycling, and prednisone, continue albuterol.  I personally performed the services described in this documentation, which was scribed in my presence. The recorded information has been reviewed and is accurate.  Orpah Greek, MD 06/21/14 2000

## 2014-06-25 ENCOUNTER — Encounter: Payer: Self-pay | Admitting: Medical

## 2014-06-25 ENCOUNTER — Ambulatory Visit (INDEPENDENT_AMBULATORY_CARE_PROVIDER_SITE_OTHER): Payer: BLUE CROSS/BLUE SHIELD | Admitting: Medical

## 2014-06-25 VITALS — BP 125/78 | HR 95 | Temp 98.8°F | Ht 66.2 in | Wt 312.0 lb

## 2014-06-25 DIAGNOSIS — R062 Wheezing: Secondary | ICD-10-CM

## 2014-06-25 DIAGNOSIS — J0100 Acute maxillary sinusitis, unspecified: Secondary | ICD-10-CM

## 2014-06-25 MED ORDER — BECLOMETHASONE DIPROPIONATE 40 MCG/ACT IN AERS: 2.0000 | INHALATION_SPRAY | Freq: Two times a day (BID) | RESPIRATORY_TRACT | Status: DC

## 2014-06-25 NOTE — Progress Notes (Signed)
Subjective:    Patient ID: [name], female    DOB: 04-19-1956, 59 y.o.   MRN: [id number]  HPI  Patient here for recent wheezing over weekend. Pt went to ED. She had 2 breathing treatment in the ED. They  Put her on prednisone. Pt states was the prednisone is a tapered dose. She started at 60 mg. Today would be 40 mg dose. This is a tapered dose. Since start of steroid her face feels puffy.  Pt was also given doxycycline 100 mg tab for 10 days.  Sinus were tender.  Pt is diabetic. Her a1-c was 6.4 in October.  Pt states that her wheezing is improved. Cough is improved.Pt was given proventil inhaler. She only used it one yesterday.  Past Medical History  Diagnosis Date  . GERD (gastroesophageal reflux disease)   . Depression   . Hypertension   . Anxiety   . Allergic rhinitis   . Osteoarthritis   . Breast cancer   . Viral meningitis   . Vitamin D deficiency   . Hyperparathyroidism   . Family history of breast cancer in first degree relative   . Colon polyps   . Diabetes mellitus   . Edema   . Obesity     Review of Systems     Objective:    Physical Exam    General  Mental Status - Alert. General Appearance - Well groomed. Not in acute distress.  Skin Rashes- No Rashes.  HEENT Head- Normal. Ear Auditory Canal - Left- Normal. Right - Normal.Tympanic Membrane- Left- Normal. Right- Normal. Eye Sclera/Conjunctiva- Left- Normal. Right- Normal. Nose & Sinuses Nasal Mucosa- Left-  Boggy and Congested. Right-  Boggy and  Congested.Bilateral mild  maxillary and  Mild frontal sinus pressure. Puffy appearance to face.(Although I have never seen her) Mouth & Throat Lips: Upper Lip- Normal: no dryness, cracking, pallor, cyanosis, or vesicular eruption. Lower Lip-Normal: no dryness, cracking, pallor, cyanosis or vesicular eruption. Buccal Mucosa- Bilateral- No Aphthous ulcers. Oropharynx- No Discharge or Erythema. Tonsils: Characteristics- Bilateral- No Erythema or  Congestion. Size/Enlargement- Bilateral- No enlargement. Discharge- bilateral-None.  Neck Neck- Supple. No Masses.   Chest and Lung Exam Auscultation: Breath Sounds:-Clear even and unlabored. Only faint mild expiratory wheeze upper lobes. Cardiovascular Auscultation:Rythm- Regular, rate and rhythm. Murmurs & Other Heart Sounds:Ausculatation of the heart reveal- No Murmurs.  Lymphatic Head & Neck General Head & Neck Lymphatics: Bilateral: Description- No Localized lymphadenopathy.    BP 125/78 mmHg  Pulse 95  Temp(Src) 98.8 F (37.1 C) (Oral)  Ht 5' 6.2" (1.681 m)  Wt 312 lb (141.522 kg)  BMI 50.08 kg/m2  SpO2 99% Wt Readings from Last 3 Encounters:  06/25/14 312 lb (141.522 kg)  06/21/14 304 lb (137.893 kg)  05/12/14 304 lb 8 oz (138.12 kg)     Lab Results  Component Value Date   WBC 9.0 12/08/2013   HGB 10.9* 12/08/2013   HCT 34.1* 12/08/2013   PLT 254.0 12/08/2013   GLUCOSE 109* 03/09/2014   CHOL 191 11/24/2013   TRIG 179.0* 11/24/2013   HDL 48.10 11/24/2013   LDLDIRECT 96.5 04/10/2013   LDLCALC 107* 11/24/2013   ALT 25 11/24/2013   AST 22 11/24/2013   NA 148* 03/09/2014   K 4.1 03/09/2014   CL 110 03/09/2014   CREATININE 1.0 03/09/2014   BUN 15 03/09/2014   CO2 25 03/09/2014   TSH 0.88 12/08/2013   INR 2.0* 06/21/2008   HGBA1C 6.4 03/09/2014    Dg Chest  2 View  06/21/2014   CLINICAL DATA:  Acute onset of cough, congestion and shortness of breath for 2 days. Initial encounter.  EXAM: CHEST  2 VIEW  COMPARISON:  Chest radiograph performed 03/31/2012  FINDINGS: The lungs are well-aerated. Mild vascular congestion is noted, without definite pulmonary edema. There is no evidence of focal opacification, pleural effusion or pneumothorax.  The heart is normal in size; the mediastinal contour is within normal limits. No acute osseous abnormalities are seen.  IMPRESSION: Mild vascular congestion, without definite pulmonary edema.   Electronically Signed   By:  Garald Balding M.D.   On: 06/21/2014 19:25       Assessment & Plan:

## 2014-06-25 NOTE — Assessment & Plan Note (Signed)
For your wheezing, I will add qvar and continue albuterol. You may have had side effect of some facial puffiness from steroids and you are diabetic. I want you to shorten taper course of the prednisone to 40 mg next 2 days and then 20 mg for the final 2 days.

## 2014-06-25 NOTE — Assessment & Plan Note (Signed)
Doxycycline is antibiotic that can treat sinusitis and bronchitis. If you sinus pressure worsen then would switch you to levofloxin.

## 2014-06-25 NOTE — Patient Instructions (Signed)
For your wheezing, I will add qvar and continue albuterol. You may have had side effect of some facial puffiness from steroids and you are diabetic. I want you to shorten taper course of the prednisone to 40 mg next 2 days and then 20 mg for the final 2 days.  Doxycycline is antibiotic that can treat sinusitis and bronchitis. If you sinus pressure worsen then would switch you to levofloxin.  For you diabetes we did fsbs in office. While on prednsone. Please watch your sugar intake(minimize)  Follow up in 7 days or as needed.

## 2014-06-25 NOTE — Progress Notes (Signed)
Pre visit review using our clinic review tool, if applicable. No additional management support is needed unless otherwise documented below in the visit note. 

## 2014-07-03 ENCOUNTER — Other Ambulatory Visit: Payer: Self-pay | Admitting: General Practice

## 2014-07-03 MED ORDER — ESOMEPRAZOLE MAGNESIUM 40 MG PO CPDR: 40.0000 mg | DELAYED_RELEASE_CAPSULE | Freq: Every day | ORAL | Status: DC

## 2014-07-06 ENCOUNTER — Ambulatory Visit (HOSPITAL_BASED_OUTPATIENT_CLINIC_OR_DEPARTMENT_OTHER)
Admission: RE | Admit: 2014-07-06 | Discharge: 2014-07-06 | Disposition: A | Discharge status: Home / Self Care | Payer: BLUE CROSS/BLUE SHIELD | Source: Ambulatory Visit | Attending: Family Medicine | Admitting: Family Medicine

## 2014-07-06 ENCOUNTER — Ambulatory Visit (INDEPENDENT_AMBULATORY_CARE_PROVIDER_SITE_OTHER): Payer: BLUE CROSS/BLUE SHIELD | Admitting: Family Medicine

## 2014-07-06 ENCOUNTER — Encounter: Payer: Self-pay | Admitting: Family Medicine

## 2014-07-06 VITALS — BP 132/86 | HR 107 | Temp 98.6°F | Resp 17 | Wt 305.1 lb

## 2014-07-06 DIAGNOSIS — R05 Cough: Secondary | ICD-10-CM

## 2014-07-06 DIAGNOSIS — R059 Cough, unspecified: Secondary | ICD-10-CM

## 2014-07-06 DIAGNOSIS — H9202 Otalgia, left ear: Secondary | ICD-10-CM

## 2014-07-06 DIAGNOSIS — J45909 Unspecified asthma, uncomplicated: Secondary | ICD-10-CM | POA: Insufficient documentation

## 2014-07-06 MED ORDER — PROMETHAZINE-DM 6.25-15 MG/5ML PO SYRP: 5.0000 mL | ORAL_SOLUTION | Freq: Four times a day (QID) | ORAL | Status: DC | PRN

## 2014-07-06 MED ORDER — AZITHROMYCIN 250 MG PO TABS: ORAL_TABLET | ORAL | Status: DC

## 2014-07-06 MED ORDER — METHYLPREDNISOLONE ACETATE 80 MG/ML IJ SUSP
80.0000 mg | Freq: Once | INTRAMUSCULAR | Status: AC
  Administered 2014-07-06: 80 mg via INTRAMUSCULAR

## 2014-07-06 MED ORDER — TELMISARTAN-HCTZ 80-25 MG PO TABS: ORAL_TABLET | ORAL | Status: DC

## 2014-07-06 MED ORDER — IPRATROPIUM-ALBUTEROL 0.5-2.5 (3) MG/3ML IN SOLN
3.0000 mL | Freq: Once | RESPIRATORY_TRACT | Status: AC
  Administered 2014-07-06: 3 mL via RESPIRATORY_TRACT

## 2014-07-06 MED ORDER — BENZONATATE 200 MG PO CAPS: 200.0000 mg | ORAL_CAPSULE | Freq: Three times a day (TID) | ORAL | Status: DC | PRN

## 2014-07-06 NOTE — Progress Notes (Signed)
   Subjective:    Patient ID: [name], female    DOB: 1956-04-02, 59 y.o.   MRN: [id number]  HPI URI- pt was seen in ER on 1/24 and here in office on 1/28.  Pt was started on Doxycycline, Qvar, Albuterol, and Prednisone.  Xray showed mild vascular congestion w/o edema or other abnormality.  Pt was feeling better until yesterday when she developed chest tightness, congestion, wheezing, SOB.  Pt reports she woke up w/ her symptoms.  Subjective fever last night.  + sick contacts.    Review of Systems For ROS see HPI     Objective:   Physical Exam  Constitutional: She appears well-developed and well-nourished. No distress.  HENT:  Head: Normocephalic and atraumatic.  TMs normal bilaterally Mild nasal congestion Throat w/out erythema, edema, or exudate L facial swelling anterior to ear  Eyes: Conjunctivae and EOM are normal. Pupils are equal, round, and reactive to light.  Neck: Normal range of motion. Neck supple.  Cardiovascular: Normal rate, regular rhythm, normal heart sounds and intact distal pulses.   No murmur heard. Pulmonary/Chest: Effort normal. No respiratory distress. She has no wheezes.  + hacking cough Decreased BS at the bases No wheezes or crackles heard  Lymphadenopathy:    She has no cervical adenopathy.          Assessment & Plan:

## 2014-07-06 NOTE — Assessment & Plan Note (Signed)
Pt's cough and chest tightness consistent w/ URI induced asthma exacerbation.  Pt just completed course of Doxycycline and prednisone which resolved her sxs until yesterday.  Pt's cough and SOB improved s/p duoneb in office.  She is using Qvar and Albuterol at home.  Will repeat CXR to assess for possible infectious process or other underlying issue.  Start Zpack.  Depomedrol given in office.  Cough meds prn.  Reviewed supportive care and red flags that should prompt return.  Pt expressed understanding and is in agreement w/ plan.

## 2014-07-06 NOTE — Patient Instructions (Signed)
Schedule a diabetes visit in 1 month Go downstairs and get your chest xray Start the Zpack as directed Use Tessalon cough pills in addition to Mucinex DM for daytime cough Promethazine cough syrup for nights/weekends- will cause drowsiness Drink plenty of fluids REST! Continue the Qvar daily- 2 puffs twice daily Use the albuterol as needed for cough, shortness of breath, wheezing We'll call you with your ENT appt Call with any questions or concerns Hang in there!

## 2014-07-06 NOTE — Progress Notes (Signed)
Pre visit review using our clinic review tool, if applicable. No additional management support is needed unless otherwise documented below in the visit note. 

## 2014-07-06 NOTE — Assessment & Plan Note (Signed)
Ongoing.  I had attempted to refer pt to [facility] but they required her to pay out of pocket for the visit prior to billing insurance.  Pt is unable to afford this but continues to have severe ear and facial pain.  Will refer to ENT.  Pt expressed understanding and is in agreement w/ plan.

## 2014-07-14 ENCOUNTER — Ambulatory Visit (INDEPENDENT_AMBULATORY_CARE_PROVIDER_SITE_OTHER): Payer: BLUE CROSS/BLUE SHIELD | Admitting: Physician Assistant

## 2014-07-14 ENCOUNTER — Encounter: Payer: Self-pay | Admitting: Physician Assistant

## 2014-07-14 ENCOUNTER — Telehealth: Payer: Self-pay | Admitting: Family Medicine

## 2014-07-14 VITALS — BP 128/62 | HR 101 | Temp 98.9°F | Resp 18 | Ht 66.5 in | Wt 309.2 lb

## 2014-07-14 DIAGNOSIS — J45901 Unspecified asthma with (acute) exacerbation: Secondary | ICD-10-CM

## 2014-07-14 DIAGNOSIS — J9801 Acute bronchospasm: Secondary | ICD-10-CM

## 2014-07-14 HISTORY — DX: Unspecified asthma with (acute) exacerbation: J45.901

## 2014-07-14 MED ORDER — IPRATROPIUM-ALBUTEROL 0.5-2.5 (3) MG/3ML IN SOLN
3.0000 mL | RESPIRATORY_TRACT | Status: DC
  Administered 2014-07-14: 3 mL via RESPIRATORY_TRACT

## 2014-07-14 MED ORDER — PREDNISONE 20 MG PO TABS: 40.0000 mg | ORAL_TABLET | Freq: Every day | ORAL | Status: DC

## 2014-07-14 NOTE — Telephone Encounter (Signed)
Patient Name: [name] DOB: [birth year] Initial Comment Caller states pt c/o SOB Nurse Assessment Nurse: Ronnald Ramp, RN, Miranda Date/Time (Eastern Time): 07/14/2014 9:10:42 AM Confirm and document reason for call. If symptomatic, describe symptoms. ---Caller states she is having SOB. She has been seen x 3 for this and not getting better. She was seen by MD 1 week ago and started on 2nd round of antibiotics, cough med, and inhaler. Using Albuterol Q4hrs (last dose 45 min ago). Has the patient traveled out of the country within the last 30 days? ---No Does the patient require triage? ---Yes Related visit to physician within the last 2 weeks? ---Yes Does the PT have any chronic conditions? (i.e. diabetes, asthma, etc.) ---Yes List chronic conditions. ---Asthma Guidelines Guideline Title Affirmed Question Affirmed Notes Asthma Attack [1] MILD asthma attack (e.g., no SOB at rest, mild SOB with walking, speaks normally in sentences, mild wheezing) AND [2] persists > 24 hours on appropriate treatment Final Disposition User See Physician within 24 Hours Ronnald Ramp, RN, Miranda Comments Appt scheduled for 4pm today with Dr. Hassell Done.

## 2014-07-14 NOTE — Patient Instructions (Signed)
Please take prednisone as directed for 5 days.  Continue Qvar twice daily and ALbuterol every 6 hours.  Stay well hydrated.  Continue Mucinex for any congestion that is lingering.  If symptoms not improving, we will need to send you to Pulmonology.

## 2014-07-14 NOTE — Progress Notes (Signed)
Patient presents to clinic today c/o continued cough and wheezing despite two rounds of antibiotics (doxycycline and azithromycin) for acute bronchitis.  Endorses completing antibiotic courses as directed. IS taking Rx cough syrup that is helping some with cough.  Recent CXR negative for concerning findings. Patient has been taking her Qvar as directed.  Has only been taking albuterol occasionally.  Denies fever, chills, SOB or chest pain.  Past Medical History  Diagnosis Date  . GERD (gastroesophageal reflux disease)   . Depression   . Hypertension   . Anxiety   . Allergic rhinitis   . Osteoarthritis   . Breast cancer   . Viral meningitis   . Vitamin D deficiency   . Hyperparathyroidism   . Family history of breast cancer in first degree relative   . Colon polyps   . Diabetes mellitus   . Edema   . Obesity     Current Outpatient Prescriptions on File Prior to Visit  Medication Sig Dispense Refill  . albuterol (PROVENTIL HFA;VENTOLIN HFA) 108 (90 BASE) MCG/ACT inhaler Inhale 2 puffs into the lungs every 4 (four) hours as needed for wheezing or shortness of breath. 1 Inhaler 0  . albuterol (PROVENTIL) (2.5 MG/3ML) 0.083% nebulizer solution Take 3 mLs (2.5 mg total) by nebulization every 4 (four) hours as needed for wheezing or shortness of breath. 25 vial 0  . beclomethasone (QVAR) 40 MCG/ACT inhaler Inhale 2 puffs into the lungs 2 (two) times daily. 1 Inhaler 3  . Calcium Carbonate (CALTRATE 600) 1500 MG TABS Take by mouth daily.      Marland Kitchen esomeprazole (NEXIUM) 40 MG capsule Take 1 capsule (40 mg total) by mouth daily. 90 capsule 1  . ferrous sulfate 325 (65 FE) MG EC tablet Take 325 mg by mouth daily.    . furosemide (LASIX) 40 MG tablet Take 1 tablet (40 mg total) by mouth 2 (two) times daily. (Patient taking differently: Take 40 mg by mouth daily. ) 60 tablet 3  . metFORMIN (GLUCOPHAGE) 500 MG tablet TAKE 1 TABLET (500 MG TOTAL) BY MOUTH 2 (TWO) TIMES DAILY WITH A MEAL. (Patient  taking differently: TAKE 1 TABLET (500 MG TOTAL) BY MOUTH ONCE DAILY WITH A MEAL.) 180 tablet 2  . Multiple Vitamin (MULTIVITAMIN) tablet Take 1 tablet by mouth daily.      . Omega-3 Fatty Acids (FISH OIL PO) Take 1 capsule by mouth daily.    . Potassium (POTASSIMIN PO) Take by mouth.    . promethazine-dextromethorphan (PROMETHAZINE-DM) 6.25-15 MG/5ML syrup Take 5 mLs by mouth 4 (four) times daily as needed. 240 mL 0  . telmisartan-hydrochlorothiazide (MICARDIS HCT) 80-25 MG per tablet TAKE 1 TABLET DAILY 90 tablet 1   No current facility-administered medications on file prior to visit.    Allergies  Allergen Reactions  . Penicillins Hives    Family History  Problem Relation Age of Onset  . Breast cancer    . Ovarian cancer    . Colon polyps    . Emphysema Father   . Lung cancer Father   . Breast cancer Maternal Aunt   . Colon cancer Maternal Aunt   . Breast cancer Paternal Grandfather   . Heart disease Paternal Grandfather   . Prostate cancer Paternal Grandfather   . Asthma Daughter     "seasonal"  . Arthritis Maternal Grandmother     History   Social History  . Marital Status: Widowed    Spouse Name: N/A  . Number of Children: 1  .  Years of Education: N/A   Occupational History  . [place] History Main Topics  . Smoking status: Never Smoker   . Smokeless tobacco: Never Used  . Alcohol Use: Yes     Comment: occ.  . Drug Use: No  . Sexual Activity: Not on file   Other Topics Concern  . None   Social History Narrative   Review of Systems - See HPI.  All other ROS are negative.  BP 128/62 mmHg  Pulse 101  Temp(Src) 98.9 F (37.2 C) (Oral)  Resp 18  Ht 5' 6.5" (1.689 m)  Wt 309 lb 4 oz (140.275 kg)  BMI 49.17 kg/m2  SpO2 98%  Physical Exam  Constitutional: She is oriented to person, place, and time and well-developed, well-nourished, and in no distress.  HENT:  Head: Normocephalic and atraumatic.  Right Ear: Tympanic  membrane, external ear and ear canal normal.  Left Ear: Tympanic membrane, external ear and ear canal normal.  Nose: Nose normal.  Mouth/Throat: Uvula is midline, oropharynx is clear and moist and mucous membranes are normal.  Eyes: Conjunctivae are normal. Pupils are equal, round, and reactive to light.  Neck: Neck supple.  Cardiovascular: Normal rate, regular rhythm, normal heart sounds and intact distal pulses.   Pulmonary/Chest: Effort normal. No respiratory distress. She has wheezes. She has no rales. She exhibits no tenderness.  Lymphadenopathy:    She has no cervical adenopathy.  Neurological: She is alert and oriented to person, place, and time.  Skin: Skin is warm and dry. No rash noted.  Psychiatric: Affect normal.  Vitals reviewed.  Assessment/Plan: Asthma with acute exacerbation Recent CXR unremarkable. W/ moderate bronchospasm.  Lung exam markedly improved with Duoneb treatment.  Rx Prednisone 40 mg QD x 5 days.  Continue Qvar BID as directed.  Albuterol PRN.  Increase fluids. Humidifier in bedroom.  Hard candy to help suppress cough reflex.  Follow-up if no improvement in 48 hours.

## 2014-07-14 NOTE — Assessment & Plan Note (Signed)
Recent CXR unremarkable. W/ moderate bronchospasm.  Lung exam markedly improved with Duoneb treatment.  Rx Prednisone 40 mg QD x 5 days.  Continue Qvar BID as directed.  Albuterol PRN.  Increase fluids. Humidifier in bedroom.  Hard candy to help suppress cough reflex.  Follow-up if no improvement in 48 hours.

## 2014-07-14 NOTE — Progress Notes (Signed)
Pre visit review using our clinic review tool, if applicable. No additional management support is needed unless otherwise documented below in the visit note/SLS  

## 2014-07-14 NOTE — Telephone Encounter (Signed)
Patient is scheduled to be seen today.

## 2014-07-27 ENCOUNTER — Other Ambulatory Visit: Payer: Self-pay

## 2014-07-27 DIAGNOSIS — Z1231 Encounter for screening mammogram for malignant neoplasm of breast: Secondary | ICD-10-CM

## 2014-08-27 ENCOUNTER — Ambulatory Visit (INDEPENDENT_AMBULATORY_CARE_PROVIDER_SITE_OTHER): Payer: BLUE CROSS/BLUE SHIELD | Admitting: Family Medicine

## 2014-08-27 ENCOUNTER — Encounter: Payer: Self-pay | Admitting: Family Medicine

## 2014-08-27 VITALS — BP 126/76 | HR 95 | Temp 98.0°F | Resp 16 | Wt 302.5 lb

## 2014-08-27 DIAGNOSIS — E119 Type 2 diabetes mellitus without complications: Secondary | ICD-10-CM

## 2014-08-27 NOTE — Progress Notes (Signed)
   Subjective:    Patient ID: [name], female    DOB: 1956/04/26, 59 y.o.   MRN: [id number]  HPI DM- ongoing issue for pt.  On Metformin daily.  On ARB for renal protection.  UTD on eye exam.  Down 7 lbs since Feb.  Pt is no longer planning on bariatric surgery.  Pt is having intermittent episodes, 'i feel funny'.  Exercising regularly.  No CP, SOB, HAs, visual changes, edema, abd pain, N/V, numbness/tingling hands/feet.  Increased water intake.   Review of Systems For ROS see HPI     Objective:   Physical Exam  Constitutional: She is oriented to person, place, and time. She appears well-developed and well-nourished. No distress.  HENT:  Head: Normocephalic and atraumatic.  Eyes: Conjunctivae and EOM are normal. Pupils are equal, round, and reactive to light.  Neck: Normal range of motion. Neck supple. No thyromegaly present.  Cardiovascular: Normal rate, regular rhythm, normal heart sounds and intact distal pulses.   No murmur heard. Pulmonary/Chest: Effort normal and breath sounds normal. No respiratory distress.  Abdominal: Soft. She exhibits no distension. There is no tenderness.  Musculoskeletal: She exhibits no edema.  Lymphadenopathy:    She has no cervical adenopathy.  Neurological: She is alert and oriented to person, place, and time.  Skin: Skin is warm and dry.  Psychiatric: She has a normal mood and affect. Her behavior is normal.  Vitals reviewed.         Assessment & Plan:

## 2014-08-27 NOTE — Progress Notes (Signed)
Pre visit review using our clinic review tool, if applicable. No additional management support is needed unless otherwise documented below in the visit note. 

## 2014-08-27 NOTE — Patient Instructions (Signed)
Schedule your complete physical for after July 15th [facility] notify you of your lab results and make any changes if needed Keep up the good work on healthy diet and regular exercise- you're doing great! Call with any questions or concerns Happy Spring!!!

## 2014-08-28 LAB — HEMOGLOBIN A1C: Hgb A1c MFr Bld: 6.8 % — ABNORMAL HIGH (ref 4.6–6.5)

## 2014-08-28 LAB — BASIC METABOLIC PANEL
BUN: 18 mg/dL (ref 6–23)
CO2: 30 mEq/L (ref 19–32)
Calcium: 10 mg/dL (ref 8.4–10.5)
Chloride: 97 mEq/L (ref 96–112)
Creatinine, Ser: 0.96 mg/dL (ref 0.40–1.20)
GFR: 62.99 mL/min (ref >60.00)
Glucose, Bld: 81 mg/dL (ref 70–99)
Potassium: 3.5 mEq/L (ref 3.5–5.1)
Sodium: 137 mEq/L (ref 135–145)

## 2014-08-28 LAB — LIPID PANEL
Cholesterol: 175 mg/dL (ref 0–200)
HDL: 41.7 mg/dL (ref >39.00)
LDL Cholesterol: 102 mg/dL — ABNORMAL HIGH (ref 0–99)
NonHDL: 133.3
Total CHOL/HDL Ratio: 4
Triglycerides: 156 mg/dL — ABNORMAL HIGH (ref 0.0–149.0)
VLDL: 31.2 mg/dL (ref 0.0–40.0)

## 2014-08-28 LAB — HEPATIC FUNCTION PANEL
ALT: 19 U/L (ref 0–35)
AST: 20 U/L (ref 0–37)
Albumin: 4.1 g/dL (ref 3.5–5.2)
Alkaline Phosphatase: 60 U/L (ref 39–117)
Bilirubin, Direct: 0.1 mg/dL (ref 0.0–0.3)
Total Bilirubin: 0.3 mg/dL (ref 0.2–1.2)
Total Protein: 7.5 g/dL (ref 6.0–8.3)

## 2014-08-28 LAB — TSH: TSH: 1.27 u[IU]/mL (ref 0.35–4.50)

## 2014-08-30 NOTE — Assessment & Plan Note (Signed)
Chronic problem.  Tolerating metformin w/o difficulty.  UTD on eye exam.  On ARB for renal protection.  Check labs.  Anticipatory guidance provided.

## 2014-09-03 ENCOUNTER — Ambulatory Visit
Admission: RE | Admit: 2014-09-03 | Discharge: 2014-09-03 | Disposition: A | Discharge status: Home / Self Care | Payer: BLUE CROSS/BLUE SHIELD | Source: Ambulatory Visit

## 2014-09-03 DIAGNOSIS — Z1231 Encounter for screening mammogram for malignant neoplasm of breast: Secondary | ICD-10-CM

## 2014-09-21 ENCOUNTER — Ambulatory Visit (INDEPENDENT_AMBULATORY_CARE_PROVIDER_SITE_OTHER): Payer: BLUE CROSS/BLUE SHIELD | Admitting: Family Medicine

## 2014-09-21 ENCOUNTER — Other Ambulatory Visit: Payer: Self-pay | Admitting: Family Medicine

## 2014-09-21 ENCOUNTER — Encounter: Payer: Self-pay | Admitting: Family Medicine

## 2014-09-21 VITALS — BP 118/80 | HR 89 | Temp 98.6°F | Resp 16 | Wt 303.5 lb

## 2014-09-21 DIAGNOSIS — K5732 Diverticulitis of large intestine without perforation or abscess without bleeding: Secondary | ICD-10-CM

## 2014-09-21 DIAGNOSIS — R7989 Other specified abnormal findings of blood chemistry: Secondary | ICD-10-CM

## 2014-09-21 DIAGNOSIS — R945 Abnormal results of liver function studies: Principal | ICD-10-CM

## 2014-09-21 HISTORY — DX: Diverticulitis of large intestine without perforation or abscess without bleeding: K57.32

## 2014-09-21 LAB — CBC WITH DIFFERENTIAL/PLATELET
Basophils Absolute: 0 10*3/uL (ref 0.0–0.1)
Basophils Relative: 0.4 % (ref 0.0–3.0)
Eosinophils Absolute: 0.1 10*3/uL (ref 0.0–0.7)
Eosinophils Relative: 1.5 % (ref 0.0–5.0)
HCT: 35.9 % — ABNORMAL LOW (ref 36.0–46.0)
Hemoglobin: 11.7 g/dL — ABNORMAL LOW (ref 12.0–15.0)
Lymphocytes Relative: 28.6 % (ref 12.0–46.0)
Lymphs Abs: 2.3 10*3/uL (ref 0.7–4.0)
MCHC: 32.6 g/dL (ref 30.0–36.0)
MCV: 81.6 fl (ref 78.0–100.0)
Monocytes Absolute: 0.8 10*3/uL (ref 0.1–1.0)
Monocytes Relative: 10 % (ref 3.0–12.0)
Neutro Abs: 4.7 10*3/uL (ref 1.4–7.7)
Neutrophils Relative %: 59.5 % (ref 43.0–77.0)
Platelets: 270 10*3/uL (ref 150.0–400.0)
RBC: 4.4 Mil/uL (ref 3.87–5.11)
RDW: 17.1 % — ABNORMAL HIGH (ref 11.5–15.5)
WBC: 7.9 10*3/uL (ref 4.0–10.5)

## 2014-09-21 LAB — HEPATIC FUNCTION PANEL
ALT: 42 U/L — ABNORMAL HIGH (ref 0–35)
AST: 32 U/L (ref 0–37)
Albumin: 4 g/dL (ref 3.5–5.2)
Alkaline Phosphatase: 61 U/L (ref 39–117)
Bilirubin, Direct: 0.1 mg/dL (ref 0.0–0.3)
Total Bilirubin: 0.5 mg/dL (ref 0.2–1.2)
Total Protein: 7.2 g/dL (ref 6.0–8.3)

## 2014-09-21 LAB — BASIC METABOLIC PANEL
BUN: 12 mg/dL (ref 6–23)
CO2: 25 mEq/L (ref 19–32)
Calcium: 8.8 mg/dL (ref 8.4–10.5)
Chloride: 104 mEq/L (ref 96–112)
Creatinine, Ser: 0.81 mg/dL (ref 0.40–1.20)
GFR: 76.62 mL/min (ref >60.00)
Glucose, Bld: 90 mg/dL (ref 70–99)
Potassium: 3.7 mEq/L (ref 3.5–5.1)
Sodium: 136 mEq/L (ref 135–145)

## 2014-09-21 MED ORDER — CIPROFLOXACIN HCL 500 MG PO TABS
500.0000 mg | ORAL_TABLET | Freq: Two times a day (BID) | ORAL | Status: DC
Start: 2014-09-21 — End: 2015-01-21

## 2014-09-21 MED ORDER — METRONIDAZOLE 500 MG PO TABS: 500.0000 mg | ORAL_TABLET | Freq: Three times a day (TID) | ORAL | Status: DC

## 2014-09-21 MED ORDER — ONDANSETRON HCL 4 MG PO TABS: 4.0000 mg | ORAL_TABLET | Freq: Three times a day (TID) | ORAL | Status: DC | PRN

## 2014-09-21 NOTE — Patient Instructions (Signed)
Follow up as needed Start the Cipro twice daily and the flagyl 3x/day for the next 10 days Drink plenty of fluids!!! Clear diet (liquids, jello, broth, applesauce) until pain resolves and then advance diet slowly Alternate Phenergan and Zofran every 4 hrs for nausea REST! Call with any questions or concerns Hang in there!!

## 2014-09-21 NOTE — Assessment & Plan Note (Signed)
Pt's sxs and PE consistent w/ diverticulitis.  Start Cipro and Flagyl.  Clear liquid diet.  Alternate Zofran and phenergan for nausea.  Reviewed supportive care and red flags that should prompt return.  Pt expressed understanding and is in agreement w/ plan.

## 2014-09-21 NOTE — Progress Notes (Signed)
Pre visit review using our clinic review tool, if applicable. No additional management support is needed unless otherwise documented below in the visit note. 

## 2014-09-21 NOTE — Progress Notes (Signed)
   Subjective:    Patient ID: [name], female    DOB: 12-28-1955, 59 y.o.   MRN: [id number]  HPI Diarrhea- pt reports diarrhea x4 days.  Had vomiting and fever to 100.8 on Friday but that resolved.  + LLQ pain.  No blood or mucous.  Taking phenergan for nausea w/ only mild relief.  Pt has hx of diverticulitis and pt reports this feels similar.  No known sick contacts.   Review of Systems For ROS see HPI     Objective:   Physical Exam  Constitutional: She is oriented to person, place, and time. She appears well-developed and well-nourished. No distress.  HENT:  Head: Normocephalic and atraumatic.  Cardiovascular: Normal rate, regular rhythm, normal heart sounds and intact distal pulses.   Pulmonary/Chest: Effort normal and breath sounds normal. No respiratory distress. She has no wheezes. She has no rales.  Abdominal: Soft. Bowel sounds are normal. She exhibits no distension. There is tenderness (LLQ pain). There is no rebound and no guarding.  Musculoskeletal: She exhibits no edema.  Neurological: She is alert and oriented to person, place, and time.  Skin: Skin is warm and dry.  Psychiatric: She has a normal mood and affect. Her behavior is normal. Thought content normal.          Assessment & Plan:

## 2014-10-05 ENCOUNTER — Encounter: Payer: Self-pay | Admitting: Internal Medicine

## 2014-10-06 ENCOUNTER — Other Ambulatory Visit: Payer: Self-pay | Admitting: General Practice

## 2014-10-06 MED ORDER — METFORMIN HCL 500 MG PO TABS: ORAL_TABLET | ORAL | Status: DC

## 2014-12-25 ENCOUNTER — Other Ambulatory Visit: Payer: Self-pay | Admitting: Family Medicine

## 2014-12-27 ENCOUNTER — Other Ambulatory Visit: Payer: Self-pay | Admitting: Family Medicine

## 2014-12-28 NOTE — Telephone Encounter (Signed)
Med filled.  

## 2014-12-28 NOTE — Telephone Encounter (Signed)
Medication filled to pharmacy as requested.   

## 2015-01-21 ENCOUNTER — Encounter: Payer: Self-pay | Admitting: Family Medicine

## 2015-01-21 ENCOUNTER — Ambulatory Visit (INDEPENDENT_AMBULATORY_CARE_PROVIDER_SITE_OTHER): Payer: BLUE CROSS/BLUE SHIELD | Admitting: Family Medicine

## 2015-01-21 VITALS — BP 120/78 | HR 96 | Temp 98.0°F | Resp 16 | Ht 66.0 in | Wt 301.4 lb

## 2015-01-21 DIAGNOSIS — Z0000 Encounter for general adult medical examination without abnormal findings: Secondary | ICD-10-CM

## 2015-01-21 DIAGNOSIS — E1140 Type 2 diabetes mellitus with diabetic neuropathy, unspecified: Secondary | ICD-10-CM

## 2015-01-21 NOTE — Assessment & Plan Note (Signed)
Chronic problem.  UTD on eye exam, foot exam.  On ARB for renal protection.  Tolerating metformin w/o difficulty.  Again stressed need for low carb diet and regular exercise.  Check labs.  Adjust meds prn

## 2015-01-21 NOTE — Progress Notes (Signed)
   Subjective:    Patient ID: [name], female    DOB: Jan 04, 1956, 59 y.o.   MRN: [id number]  HPI CPE- UTD on colonoscopy, mammo.  No need for pap due to hysterectomy Julien Girt).  Working on [employer].  Limited exercise b/c of R knee arthritis.   Review of Systems Patient reports no vision/ hearing changes, adenopathy,fever, weight change,  persistant/recurrent hoarseness , swallowing issues, chest pain, palpitations, edema, persistant/recurrent cough, hemoptysis, dyspnea (rest/exertional/paroxysmal nocturnal), gastrointestinal bleeding (melena, rectal bleeding), abdominal pain, significant heartburn, bowel changes, GU symptoms (dysuria, hematuria, incontinence), Gyn symptoms (abnormal  bleeding, pain),  syncope, focal weakness, memory loss, numbness & tingling, skin/hair/nail changes, abnormal bruising or bleeding, anxiety, or depression.     Objective:   Physical Exam General Appearance:    Alert, cooperative, no distress, appears stated age, obese  Head:    Normocephalic, without obvious abnormality, atraumatic  Eyes:    PERRL, conjunctiva/corneas clear, EOM's intact, fundi    benign, both eyes  Ears:    Normal TM's and external ear canals, both ears  Nose:   Nares normal, septum midline, mucosa normal, no drainage    or sinus tenderness  Throat:   Lips, mucosa, and tongue normal; teeth and gums normal  Neck:   Supple, symmetrical, trachea midline, no adenopathy;    Thyroid: no enlargement/tenderness/nodules  Back:     Symmetric, no curvature, ROM normal, no CVA tenderness  Lungs:     Clear to auscultation bilaterally, respirations unlabored  Chest Wall:    No tenderness or deformity   Heart:    Regular rate and rhythm, S1 and S2 normal, no murmur, rub   or gallop  Breast Exam:    Deferred to GYN  Abdomen:     Soft, non-tender, bowel sounds active all four quadrants,    no masses, no organomegaly  Genitalia:    Deferred to GYN  Rectal:    Extremities:   Extremities normal,  atraumatic, no cyanosis or edema  Pulses:   2+ and symmetric all extremities  Skin:   Skin color, texture, turgor normal, no rashes or lesions  Lymph nodes:   Cervical, supraclavicular, and axillary nodes normal  Neurologic:   CNII-XII intact, normal strength, sensation and reflexes    throughout          Assessment & Plan:

## 2015-01-21 NOTE — Progress Notes (Signed)
Pre visit review using our clinic review tool, if applicable. No additional management support is needed unless otherwise documented below in the visit note. 

## 2015-01-21 NOTE — Patient Instructions (Signed)
Follow up in 3-4 months to recheck diabetes We'll notify you of your lab results and make any changes if needed Continue to work on healthy diet and regular exercise Call with any questions or concerns Happy Labor Day!!!

## 2015-01-21 NOTE — Assessment & Plan Note (Signed)
Pt's PE WNL w/ exception of obesity.  UTD on colonoscopy, mammo, GYN.  Check labs.  Anticipatory guidance provided.

## 2015-01-22 LAB — HEMOGLOBIN A1C: Hgb A1c MFr Bld: 6.4 % (ref 4.6–6.5)

## 2015-01-22 LAB — CBC WITH DIFFERENTIAL/PLATELET
Basophils Absolute: 0.2 10*3/uL — ABNORMAL HIGH (ref 0.0–0.1)
Basophils Relative: 1.5 % (ref 0.0–3.0)
Eosinophils Absolute: 0.1 10*3/uL (ref 0.0–0.7)
Eosinophils Relative: 0.9 % (ref 0.0–5.0)
HCT: 37.4 % (ref 36.0–46.0)
Hemoglobin: 12.1 g/dL (ref 12.0–15.0)
Lymphocytes Relative: 25.7 % (ref 12.0–46.0)
Lymphs Abs: 2.7 10*3/uL (ref 0.7–4.0)
MCHC: 32.3 g/dL (ref 30.0–36.0)
MCV: 84.3 fl (ref 78.0–100.0)
Monocytes Absolute: 0.9 10*3/uL (ref 0.1–1.0)
Monocytes Relative: 8.3 % (ref 3.0–12.0)
Neutro Abs: 6.6 10*3/uL (ref 1.4–7.7)
Neutrophils Relative %: 63.6 % (ref 43.0–77.0)
Platelets: 295 10*3/uL (ref 150.0–400.0)
RBC: 4.43 Mil/uL (ref 3.87–5.11)
RDW: 16.3 % — ABNORMAL HIGH (ref 11.5–15.5)
WBC: 10.4 10*3/uL (ref 4.0–10.5)

## 2015-01-22 LAB — BASIC METABOLIC PANEL
BUN: 17 mg/dL (ref 6–23)
CO2: 30 mEq/L (ref 19–32)
Calcium: 10 mg/dL (ref 8.4–10.5)
Chloride: 95 mEq/L — ABNORMAL LOW (ref 96–112)
Creatinine, Ser: 0.98 mg/dL (ref 0.40–1.20)
GFR: 61.42 mL/min (ref >60.00)
Glucose, Bld: 106 mg/dL — ABNORMAL HIGH (ref 70–99)
Potassium: 3.4 mEq/L — ABNORMAL LOW (ref 3.5–5.1)
Sodium: 139 mEq/L (ref 135–145)

## 2015-01-22 LAB — HEPATIC FUNCTION PANEL
ALT: 20 U/L (ref 0–35)
AST: 18 U/L (ref 0–37)
Albumin: 4.3 g/dL (ref 3.5–5.2)
Alkaline Phosphatase: 67 U/L (ref 39–117)
Bilirubin, Direct: 0.1 mg/dL (ref 0.0–0.3)
Total Bilirubin: 0.4 mg/dL (ref 0.2–1.2)
Total Protein: 7.7 g/dL (ref 6.0–8.3)

## 2015-01-22 LAB — LIPID PANEL
Cholesterol: 182 mg/dL (ref 0–200)
HDL: 44.8 mg/dL (ref >39.00)
LDL Cholesterol: 99 mg/dL (ref 0–99)
NonHDL: 137.08
Total CHOL/HDL Ratio: 4
Triglycerides: 188 mg/dL — ABNORMAL HIGH (ref 0.0–149.0)
VLDL: 37.6 mg/dL (ref 0.0–40.0)

## 2015-01-22 LAB — TSH: TSH: 0.78 u[IU]/mL (ref 0.35–4.50)

## 2015-01-22 LAB — VITAMIN D 25 HYDROXY (VIT D DEFICIENCY, FRACTURES): VITD: 41.88 ng/mL (ref 30.00–100.00)

## 2015-02-19 ENCOUNTER — Other Ambulatory Visit: Payer: Self-pay | Admitting: Family Medicine

## 2015-02-22 NOTE — Telephone Encounter (Signed)
Medication filled to pharmacy as requested.   

## 2015-03-04 ENCOUNTER — Ambulatory Visit (INDEPENDENT_AMBULATORY_CARE_PROVIDER_SITE_OTHER): Payer: BLUE CROSS/BLUE SHIELD | Admitting: Diagnostic Neuroimaging

## 2015-03-04 ENCOUNTER — Encounter (INDEPENDENT_AMBULATORY_CARE_PROVIDER_SITE_OTHER): Payer: Self-pay | Admitting: Diagnostic Neuroimaging

## 2015-03-04 DIAGNOSIS — M79672 Pain in left foot: Secondary | ICD-10-CM | POA: Diagnosis not present

## 2015-03-04 DIAGNOSIS — M79671 Pain in right foot: Secondary | ICD-10-CM | POA: Diagnosis not present

## 2015-03-04 DIAGNOSIS — Z0289 Encounter for other administrative examinations: Secondary | ICD-10-CM

## 2015-03-04 NOTE — Procedures (Signed)
   [facility]  NCS (NERVE CONDUCTION STUDY) WITH EMG (ELECTROMYOGRAPHY) REPORT   STUDY DATE: 03/04/15 PATIENT NAME: [name] DOB: 1956/02/19 MRN: [id number]  ORDERING CLINICIAN: Merita Norton, MD  TECHNOLOGIST: Laretta Alstrom  ELECTROMYOGRAPHER: Earlean Polka. Penumalli, MD  CLINICAL INFORMATION: 59 year old female with borderline diabetes, here for evaluation bilateral foot pain, numbness, burning for past 7 weeks. Also history of chronic low back pain.  FINDINGS: NERVE CONDUCTION STUDY: Bilateral peroneal and tibial motor responses and F wave latencies are normal. Bilateral H reflex responses are normal. Bilateral peroneal sensory responses are normal.   NEEDLE ELECTROMYOGRAPHY: Needle examination of right lower extremity vastus medialis and gastrocnemius is normal. Right tibialis anterior has no abnormal spontaneous activity at rest and decreased motor unit, and on exertion. Right L5-S1 paraspinal muscles have 1+ positive sharp waves.       IMPRESSION:  Abnormal study demonstrating: 1. Needle EMG evidence of possible right S1 radiculopathy. 2. No evidence of underlying large fiber neuropathy. Given the clinical context a small fiber neuropathy is not totally excluded.    INTERPRETING PHYSICIAN:  Penni Bombard, MD Certified in Neurology, Neurophysiology and Neuroimaging  [facility] [address] [place], [place] [address] [phone]

## 2015-03-31 ENCOUNTER — Encounter (HOSPITAL_COMMUNITY): Payer: Self-pay | Admitting: Emergency Medicine

## 2015-03-31 ENCOUNTER — Other Ambulatory Visit: Payer: Self-pay | Admitting: Family Medicine

## 2015-03-31 ENCOUNTER — Emergency Department (HOSPITAL_COMMUNITY)
Admission: EM | Admit: 2015-03-31 | Discharge: 2015-03-31 | Discharge status: Against Medical Advice | Payer: BLUE CROSS/BLUE SHIELD | Attending: Emergency Medicine | Admitting: Emergency Medicine

## 2015-03-31 DIAGNOSIS — E119 Type 2 diabetes mellitus without complications: Secondary | ICD-10-CM | POA: Diagnosis not present

## 2015-03-31 DIAGNOSIS — E669 Obesity, unspecified: Secondary | ICD-10-CM | POA: Diagnosis not present

## 2015-03-31 DIAGNOSIS — R7989 Other specified abnormal findings of blood chemistry: Secondary | ICD-10-CM | POA: Insufficient documentation

## 2015-03-31 DIAGNOSIS — M25572 Pain in left ankle and joints of left foot: Secondary | ICD-10-CM | POA: Diagnosis not present

## 2015-03-31 NOTE — ED Notes (Addendum)
Pt states that she has had pain in her L ankle and it is hot to the touch. Went to PCP who did a sed rate and a D-dimer and stated that they were both elevated. Alert and oriented.

## 2015-03-31 NOTE — Telephone Encounter (Signed)
Medication filled to pharmacy as requested.   

## 2015-03-31 NOTE — ED Notes (Signed)
Pt not in room when RN came in.  Gown and blanket on bed rumpled up.

## 2015-04-28 ENCOUNTER — Encounter: Payer: BLUE CROSS/BLUE SHIELD | Admitting: Family Medicine

## 2015-06-14 LAB — HM DIABETES EYE EXAM

## 2015-06-18 ENCOUNTER — Ambulatory Visit: Payer: BLUE CROSS/BLUE SHIELD | Admitting: Family Medicine

## 2015-06-24 ENCOUNTER — Ambulatory Visit (INDEPENDENT_AMBULATORY_CARE_PROVIDER_SITE_OTHER): Payer: BLUE CROSS/BLUE SHIELD | Admitting: Family Medicine

## 2015-06-24 ENCOUNTER — Encounter: Payer: Self-pay | Admitting: Family Medicine

## 2015-06-24 VITALS — BP 127/69 | HR 89 | Temp 99.2°F | Ht 67.0 in | Wt 295.8 lb

## 2015-06-24 DIAGNOSIS — E1140 Type 2 diabetes mellitus with diabetic neuropathy, unspecified: Secondary | ICD-10-CM

## 2015-06-24 DIAGNOSIS — M069 Rheumatoid arthritis, unspecified: Secondary | ICD-10-CM | POA: Diagnosis not present

## 2015-06-24 HISTORY — DX: Rheumatoid arthritis, unspecified: M06.9

## 2015-06-24 LAB — BASIC METABOLIC PANEL
BUN: 14 mg/dL (ref 6–23)
CO2: 29 mEq/L (ref 19–32)
Calcium: 9.8 mg/dL (ref 8.4–10.5)
Chloride: 100 mEq/L (ref 96–112)
Creatinine, Ser: 0.87 mg/dL (ref 0.40–1.20)
GFR: 70.37 mL/min (ref >60.00)
Glucose, Bld: 115 mg/dL — ABNORMAL HIGH (ref 70–99)
Potassium: 4.2 mEq/L (ref 3.5–5.1)
Sodium: 138 mEq/L (ref 135–145)

## 2015-06-24 LAB — HEMOGLOBIN A1C: Hgb A1c MFr Bld: 6.3 % (ref 4.6–6.5)

## 2015-06-24 NOTE — Progress Notes (Signed)
   Subjective:    Patient ID: Willette Alma, female    DOB: 1955/08/02, 60 y.o.   MRN: [id number]  HPI DM- chronic problem, currently on Metformin and Cinnamon for diabetes.  Lost 7 lbs since last visit!  UTD on foot exam, on ARB for renal protection.  UTD on eye exam, 1/15.  Denies CP, SOB, visual changes, edema, N/V.  + neuropathy.  + HAs due to stress.  Denies symptomatic lows.  Denies N/V.  RA- currently seeing Dr Titus Mould.  On [place].  Review of Systems For ROS see HPI     Objective:   Physical Exam  Constitutional: She is oriented to person, place, and time. She appears well-developed and well-nourished. No distress.  HENT:  Head: Normocephalic and atraumatic.  Eyes: Conjunctivae and EOM are normal. Pupils are equal, round, and reactive to light.  Neck: Normal range of motion. Neck supple. No thyromegaly present.  Cardiovascular: Normal rate, regular rhythm, normal heart sounds and intact distal pulses.   No murmur heard. Pulmonary/Chest: Effort normal and breath sounds normal. No respiratory distress.  Abdominal: Soft. She exhibits no distension. There is no tenderness.  Musculoskeletal: She exhibits no edema.  Lymphadenopathy:    She has no cervical adenopathy.  Neurological: She is alert and oriented to person, place, and time.  Skin: Skin is warm and dry.  Psychiatric: She has a normal mood and affect. Her behavior is normal.  Vitals reviewed.         Assessment & Plan:

## 2015-06-24 NOTE — Patient Instructions (Signed)
Follow up in 3-4 months to recheck diabetes and cholesterol We'll notify you of your lab results and make any changes if needed Keep up the good work on healthy diet and regular exercise- you're doing great! Call with any questions or concerns Happy Early Birthday!!!

## 2015-06-24 NOTE — Progress Notes (Signed)
Pre visit review using our clinic review tool, if applicable. No additional management support is needed unless otherwise documented below in the visit note. 

## 2015-06-27 NOTE — Assessment & Plan Note (Signed)
Chronic problem.  Tolerating Metformin w/o difficulty.  UTD on eye exam, foot exam, and on ARB for renal protection.  Pt is working on [employer] and regular exercise.  Check labs.  Adjust meds prn

## 2015-06-29 ENCOUNTER — Ambulatory Visit (INDEPENDENT_AMBULATORY_CARE_PROVIDER_SITE_OTHER): Payer: BLUE CROSS/BLUE SHIELD | Admitting: Medical

## 2015-06-29 ENCOUNTER — Encounter: Payer: Self-pay | Admitting: Medical

## 2015-06-29 VITALS — BP 118/78 | HR 77 | Temp 98.1°F | Ht 67.0 in | Wt 294.4 lb

## 2015-06-29 DIAGNOSIS — J0100 Acute maxillary sinusitis, unspecified: Secondary | ICD-10-CM | POA: Diagnosis not present

## 2015-06-29 DIAGNOSIS — H6693 Otitis media, unspecified, bilateral: Secondary | ICD-10-CM | POA: Diagnosis not present

## 2015-06-29 DIAGNOSIS — R05 Cough: Secondary | ICD-10-CM

## 2015-06-29 DIAGNOSIS — R059 Cough, unspecified: Secondary | ICD-10-CM

## 2015-06-29 MED ORDER — FLUTICASONE PROPIONATE 50 MCG/ACT NA SUSP: 2.0000 | Freq: Every day | NASAL | Status: DC

## 2015-06-29 MED ORDER — HYDROCODONE-HOMATROPINE 5-1.5 MG/5ML PO SYRP: 5.0000 mL | ORAL_SOLUTION | Freq: Three times a day (TID) | ORAL | Status: DC | PRN

## 2015-06-29 MED ORDER — CLARITHROMYCIN ER 500 MG PO TB24: 1000.0000 mg | ORAL_TABLET | Freq: Every day | ORAL | Status: DC

## 2015-06-29 NOTE — Patient Instructions (Addendum)
You appear to have a sinus infection. I am prescribing biaxi antibiotic for the infection. To help with the nasal congestion I prescribed nasal steroid flonase. For your associated cough, I prescribed cough medicine hycodan  You may have ear infection bilaterally as well.   Rest, hydrate, tylenol for fever.  Follow up in 7 days or as needed.

## 2015-06-29 NOTE — Progress Notes (Signed)
Pre visit review using our clinic review tool, if applicable. No additional management support is needed unless otherwise documented below in the visit note. 

## 2015-06-29 NOTE — Progress Notes (Signed)
   Subjective:    Patient ID: [name], female    DOB: 10-12-1955, 60 y.o.   MRN: [id number]  HPI   Pt in for cough and  Nasal congestion(symptoms for 2 days). Some pnd and st from the pnd per pt. Pt describes frontal and maxillary sinus pressure. When she leans over has more severe pain.   Pt has states trouble sleeping recently due to sinus pain.   Pt has history of bronchitis. Pt get some sinus infection history about one time a year usually in February.  No fever, no chills.     Review of Systems  Constitutional: Negative for fever, chills and fatigue.  HENT: Positive for congestion, postnasal drip, sinus pressure and sore throat.   Respiratory: Positive for cough. Negative for chest tightness, shortness of breath and wheezing.   Cardiovascular: Negative for chest pain and palpitations.  Musculoskeletal: Negative for myalgias and back pain.  Neurological: Negative for dizziness and facial asymmetry.  Hematological: Negative for adenopathy. Does not bruise/bleed easily.  Psychiatric/Behavioral: Negative for behavioral problems and confusion.       Objective:   Physical Exam   General  Mental Status - Alert. General Appearance - Well groomed. Not in acute distress.  Skin Rashes- No Rashes.  HEENT Head- Normal. Ear Auditory Canal - Left- Normal. Right - Normal.Tympanic Membrane- Left- mild red Right- moderate red. Eye Sclera/Conjunctiva- Left- Normal. Right- Normal. Nose & Sinuses Nasal Mucosa- Left-  Boggy and Congested. Right-  Boggy and  Congested.Bilateral maxillary and frontal sinus pressure. Mouth & Throat Lips: Upper Lip- Normal: no dryness, cracking, pallor, cyanosis, or vesicular eruption. Lower Lip-Normal: no dryness, cracking, pallor, cyanosis or vesicular eruption. Buccal Mucosa- Bilateral- No Aphthous ulcers. Oropharynx- No Discharge but  Erythema. Tonsils: Characteristics- Bilateral- moderate  Erythema and  Congestion. Size/Enlargement-  Bilateral- No enlargement. Discharge- bilateral-None.  Neck Neck- Supple. No Masses. Faint tender submandibular nodes.   Chest and Lung Exam Auscultation: Breath Sounds:-Clear even and unlabored.  Cardiovascular Auscultation:Rythm- Regular, rate and rhythm. Murmurs & Other Heart Sounds:Ausculatation of the heart reveal- No Murmurs.  Lymphatic Head & Neck General Head & Neck Lymphatics: Bilateral: Description- No Localized lymphadenopathy.      Assessment & Plan:  You appear to have a sinus infection. I am prescribing biaxi antibiotic for the infection. To help with the nasal congestion I prescribed nasal steroid flonase. For your associated cough, I prescribed cough medicine hycodan  You may have ear infection bilaterally as well.   Rest, hydrate, tylenol for fever.  Follow up in 7 days or as needed.

## 2015-07-27 ENCOUNTER — Other Ambulatory Visit: Payer: Self-pay

## 2015-07-27 DIAGNOSIS — Z1231 Encounter for screening mammogram for malignant neoplasm of breast: Secondary | ICD-10-CM

## 2015-08-12 ENCOUNTER — Other Ambulatory Visit: Payer: Self-pay | Admitting: General Practice

## 2015-08-12 MED ORDER — ESOMEPRAZOLE MAGNESIUM 40 MG PO CPDR: DELAYED_RELEASE_CAPSULE | ORAL | Status: DC

## 2015-08-12 MED ORDER — TELMISARTAN-HCTZ 80-25 MG PO TABS: 1.0000 | ORAL_TABLET | Freq: Every day | ORAL | Status: DC

## 2015-09-06 ENCOUNTER — Ambulatory Visit: Payer: BLUE CROSS/BLUE SHIELD

## 2015-09-13 ENCOUNTER — Ambulatory Visit
Admission: RE | Admit: 2015-09-13 | Discharge: 2015-09-13 | Disposition: A | Discharge status: Home / Self Care | Payer: BLUE CROSS/BLUE SHIELD | Source: Ambulatory Visit

## 2015-09-13 DIAGNOSIS — Z1231 Encounter for screening mammogram for malignant neoplasm of breast: Secondary | ICD-10-CM

## 2015-10-20 ENCOUNTER — Other Ambulatory Visit: Payer: Self-pay | Admitting: Rheumatology

## 2015-10-20 DIAGNOSIS — M25511 Pain in right shoulder: Secondary | ICD-10-CM

## 2015-10-22 ENCOUNTER — Other Ambulatory Visit: Payer: Self-pay | Admitting: General Practice

## 2015-10-22 MED ORDER — ESOMEPRAZOLE MAGNESIUM 40 MG PO CPDR: DELAYED_RELEASE_CAPSULE | ORAL | Status: DC

## 2015-10-24 ENCOUNTER — Ambulatory Visit
Admission: RE | Admit: 2015-10-24 | Discharge: 2015-10-24 | Disposition: A | Discharge status: Home / Self Care | Payer: BLUE CROSS/BLUE SHIELD | Source: Ambulatory Visit | Attending: Rheumatology | Admitting: Rheumatology

## 2015-10-24 DIAGNOSIS — M25511 Pain in right shoulder: Secondary | ICD-10-CM

## 2015-11-05 ENCOUNTER — Other Ambulatory Visit: Payer: Self-pay | Admitting: Orthopaedic Surgery

## 2015-11-08 ENCOUNTER — Other Ambulatory Visit (HOSPITAL_COMMUNITY): Payer: Self-pay | Admitting: *Deleted

## 2015-11-08 ENCOUNTER — Encounter (HOSPITAL_COMMUNITY): Payer: Self-pay | Admitting: *Deleted

## 2015-11-08 MED ORDER — CLINDAMYCIN PHOSPHATE 900 MG/50ML IV SOLN
900.0000 mg | INTRAVENOUS | Status: AC
Start: 2015-11-09 — End: 2015-11-09
  Administered 2015-11-09: 900 mg via INTRAVENOUS
  Filled 2015-11-08: qty 50

## 2015-11-08 NOTE — H&P (Signed)
[name] is an 60 y.o. female.   Chief Complaint: Right shoulder HPI: [name] is here for the first time in about a year. Her knees have calmed down nicely but she's had many months of terrible right shoulder pain. This is her nondominant side. 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 She has trouble resting at night and trouble using her arm up and out. This has been going on for 6 or 7 months. She thinks it might stem from doing some moving from one house to another back around Christmas. She has received 2 injections in the shoulder both of which helped transiently. She describes her pain as constant to severe and the problem is getting worse. She's taken some tramadol to no avail. She's been through an MRI scan and is here to review her situation.  Radiographs:  X-rays that were ordered, performed, and interpreted by me today included 3 views of the right shoulder. She has a type II A acromion with no glenohumeral degenerative change.  MRI:  I reviewed an MRI scan films and report of a study done at the [facility] system on 10/24/15. This shows some moderate tendinosis but no tear. They also read some moderate AC change.  Past Medical History  Diagnosis Date  . GERD (gastroesophageal reflux disease)   . Depression   . Hypertension   . Anxiety   . Allergic rhinitis   . Osteoarthritis   . Breast cancer ([place])   . Viral meningitis   . Vitamin D deficiency   . Hyperparathyroidism   . Family history of breast cancer in first degree relative   . Colon polyps   . Diabetes mellitus   . Edema   . Obesity     Past Surgical History  Procedure Laterality Date  . Breast surgery      Tran flap due to breast cancer  . Knee arthroscopy      bilateral  . Parathyroid resection    . Total knee arthroplasty  06/18/08    [facility]  . Mastectomy  01/13/94    Right breast  . Septoplasty  1980  . Tonsillectomy    . Parathyroidectomy    . Hand surgery      dog bite, right hand  .  Hand surgery      trauma, left hand  . Tumor removal  1982    Right great toe  . Rectal abscess    . Cesarean section  1988  . Abdominal hysterectomy  1998  . Colonoscopy    . Adenoidectomy    . Left knee replacement  2010    Family History  Problem Relation Age of Onset  . Breast cancer    . Ovarian cancer    . Colon polyps    . Emphysema Father   . Lung cancer Father   . Breast cancer Maternal Aunt   . Colon cancer Maternal Aunt   . Breast cancer Paternal Grandfather   . Heart disease Paternal Grandfather   . Prostate cancer Paternal Grandfather   . Asthma Daughter     "seasonal"  . Arthritis Maternal Grandmother    Social History:  reports that she has never smoked. She has never used smokeless tobacco. She reports that she drinks alcohol. She reports that she does not use illicit drugs.  Allergies:  Allergies  Allergen Reactions  . Penicillins Hives and Other (See Comments)    Has patient had a PCN reaction causing immediate rash, facial/tongue/throat swelling,  SOB or lightheadedness with hypotension: no Has patient had a PCN reaction causing severe rash involving mucus membranes or skin necrosis: no Has patient had a PCN reaction that required hospitalization no Has patient had a PCN reaction occurring within the last 10 years: no If all of the above answers are "NO", then may proceed with Cephalosporin use.     No prescriptions prior to admission    No results found for this or any previous visit (from the past 48 hour(s)). No results found.  Review of Systems  Musculoskeletal: Positive for joint pain.       Right shoulder  All other systems reviewed and are negative.   There were no vitals taken for this visit. Physical Exam  Constitutional: She is oriented to person, place, and time. She appears well-developed and well-nourished.  HENT:  Head: Normocephalic and atraumatic.  Eyes: Pupils are equal, round, and reactive to light.  Neck: Normal range of  motion.  Cardiovascular: Normal rate and regular rhythm.   Respiratory: Effort normal.  GI: Soft.  Musculoskeletal:  Right shoulder motion is full though painful especially in forward flexion. She has 1+ primary and 3+ secondary impingement pain. Cuff strength is good though painful in resisted forward flexion. She also has pain over her AC joint. Cervical motion is full and there is no palpable lymphadenopathy. Sensation and motor function are intact in her hands with palpable pulses on both sides.   Neurological: She is alert and oriented to person, place, and time.  Skin: Skin is warm and dry.  Psychiatric: She has a normal mood and affect. Her behavior is normal. Judgment and thought content normal.     Assessment/Plan Assessment: Right shoulder impingement and AC pain injected x2  Plan: [name] has some terrible pain in the shoulder. She's been dealing with it for more than 6 months. She's had an exercise program and pills and 2 injections. By the MRI scan she has tendinosis and I believe we can make her better with a simple acromioplasty and AC resection. I reviewed risk of anesthesia and infection. I told her she would likely be in a sling for a day or two. 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   [name], Holly Sachs, PA-C 11/08/2015, 9:41 AM

## 2015-11-08 NOTE — Progress Notes (Signed)
Patient was positive for sleep apnea and was instrcuted to bring her sleep apnea mask with her on the day of surgery.    Instructed her to 7 days prior to surgery STOP taking any Aspirin, Aleve, Naproxen, Ibuprofen, Motrin, Advil, Goody's, BC's, all herbal medications, fish oil, and all vitamins   Patient had a right mastectomy in the past and will have a restricted right arm.

## 2015-11-09 ENCOUNTER — Ambulatory Visit (HOSPITAL_COMMUNITY): Payer: BLUE CROSS/BLUE SHIELD | Admitting: Anesthesiology

## 2015-11-09 ENCOUNTER — Encounter (HOSPITAL_COMMUNITY): Payer: Self-pay | Admitting: Surgery

## 2015-11-09 ENCOUNTER — Encounter (HOSPITAL_COMMUNITY)
Admission: RE | Disposition: A | Discharge status: Home / Self Care | Payer: Self-pay | Source: Ambulatory Visit | Attending: Orthopaedic Surgery

## 2015-11-09 ENCOUNTER — Ambulatory Visit (HOSPITAL_COMMUNITY)
Admission: RE | Admit: 2015-11-09 | Discharge: 2015-11-09 | Disposition: A | Discharge status: Home / Self Care | Payer: BLUE CROSS/BLUE SHIELD | Source: Ambulatory Visit | Attending: Orthopaedic Surgery | Admitting: Orthopaedic Surgery

## 2015-11-09 DIAGNOSIS — Z7984 Long term (current) use of oral hypoglycemic drugs: Secondary | ICD-10-CM | POA: Insufficient documentation

## 2015-11-09 DIAGNOSIS — I10 Essential (primary) hypertension: Secondary | ICD-10-CM | POA: Insufficient documentation

## 2015-11-09 DIAGNOSIS — E669 Obesity, unspecified: Secondary | ICD-10-CM | POA: Diagnosis not present

## 2015-11-09 DIAGNOSIS — Z79899 Other long term (current) drug therapy: Secondary | ICD-10-CM | POA: Diagnosis not present

## 2015-11-09 DIAGNOSIS — Z96652 Presence of left artificial knee joint: Secondary | ICD-10-CM | POA: Diagnosis not present

## 2015-11-09 DIAGNOSIS — J45909 Unspecified asthma, uncomplicated: Secondary | ICD-10-CM | POA: Diagnosis not present

## 2015-11-09 DIAGNOSIS — Z853 Personal history of malignant neoplasm of breast: Secondary | ICD-10-CM | POA: Insufficient documentation

## 2015-11-09 DIAGNOSIS — Z9011 Acquired absence of right breast and nipple: Secondary | ICD-10-CM | POA: Diagnosis not present

## 2015-11-09 DIAGNOSIS — K219 Gastro-esophageal reflux disease without esophagitis: Secondary | ICD-10-CM | POA: Diagnosis not present

## 2015-11-09 DIAGNOSIS — M7541 Impingement syndrome of right shoulder: Secondary | ICD-10-CM | POA: Insufficient documentation

## 2015-11-09 DIAGNOSIS — G4730 Sleep apnea, unspecified: Secondary | ICD-10-CM | POA: Diagnosis not present

## 2015-11-09 HISTORY — DX: Nausea with vomiting, unspecified: R11.2

## 2015-11-09 HISTORY — DX: Other specified postprocedural states: Z98.890

## 2015-11-09 HISTORY — PX: SHOULDER ARTHROSCOPY: SHX128

## 2015-11-09 HISTORY — DX: Sleep apnea, unspecified: G47.30

## 2015-11-09 LAB — CBC
HCT: 37.5 % (ref 36.0–46.0)
Hemoglobin: 11.8 g/dL — ABNORMAL LOW (ref 12.0–15.0)
MCH: 27.8 pg (ref 26.0–34.0)
MCHC: 31.5 g/dL (ref 30.0–36.0)
MCV: 88.2 fL (ref 78.0–100.0)
Platelets: 228 10*3/uL (ref 150–400)
RBC: 4.25 MIL/uL (ref 3.87–5.11)
RDW: 16.9 % — ABNORMAL HIGH (ref 11.5–15.5)
WBC: 8.8 10*3/uL (ref 4.0–10.5)

## 2015-11-09 LAB — BASIC METABOLIC PANEL
Anion gap: 11 (ref 5–15)
BUN: 23 mg/dL — ABNORMAL HIGH (ref 6–20)
CO2: 28 mmol/L (ref 22–32)
Calcium: 9.9 mg/dL (ref 8.9–10.3)
Chloride: 100 mmol/L — ABNORMAL LOW (ref 101–111)
Creatinine, Ser: 1.35 mg/dL — ABNORMAL HIGH (ref 0.44–1.00)
GFR calc Af Amer: 48 mL/min — ABNORMAL LOW (ref >60)
GFR calc non Af Amer: 41 mL/min — ABNORMAL LOW (ref >60)
Glucose, Bld: 110 mg/dL — ABNORMAL HIGH (ref 65–99)
Potassium: 3.4 mmol/L — ABNORMAL LOW (ref 3.5–5.1)
Sodium: 139 mmol/L (ref 135–145)

## 2015-11-09 LAB — GLUCOSE, CAPILLARY: Glucose-Capillary: 121 mg/dL — ABNORMAL HIGH (ref 65–99)

## 2015-11-09 SURGERY — ARTHROSCOPY, SHOULDER
Anesthesia: General | Laterality: Right

## 2015-11-09 MED ORDER — FENTANYL CITRATE (PF) 100 MCG/2ML IJ SOLN
INTRAMUSCULAR | Status: AC
  Administered 2015-11-09: 100 ug via INTRAVENOUS
  Filled 2015-11-09: qty 2

## 2015-11-09 MED ORDER — STERILE WATER FOR IRRIGATION IR SOLN
Status: DC | PRN
  Administered 2015-11-09: 1000 mL

## 2015-11-09 MED ORDER — PROPOFOL 10 MG/ML IV BOLUS
INTRAVENOUS | Status: DC | PRN
  Administered 2015-11-09: 50 mg via INTRAVENOUS
  Administered 2015-11-09: 160 mg via INTRAVENOUS
  Administered 2015-11-09: 40 mg via INTRAVENOUS

## 2015-11-09 MED ORDER — FENTANYL CITRATE (PF) 100 MCG/2ML IJ SOLN
100.0000 ug | Freq: Once | INTRAMUSCULAR | Status: AC
  Administered 2015-11-09: 100 ug via INTRAVENOUS

## 2015-11-09 MED ORDER — ONDANSETRON HCL 4 MG/2ML IJ SOLN
INTRAMUSCULAR | Status: AC
  Filled 2015-11-09: qty 2

## 2015-11-09 MED ORDER — SUCCINYLCHOLINE CHLORIDE 200 MG/10ML IV SOSY
PREFILLED_SYRINGE | INTRAVENOUS | Status: AC
  Filled 2015-11-09: qty 10

## 2015-11-09 MED ORDER — PROPOFOL 10 MG/ML IV BOLUS
INTRAVENOUS | Status: AC
  Filled 2015-11-09: qty 20

## 2015-11-09 MED ORDER — CHLORHEXIDINE GLUCONATE 4 % EX LIQD: 60.0000 mL | Freq: Once | CUTANEOUS | Status: DC

## 2015-11-09 MED ORDER — HYDROCODONE-ACETAMINOPHEN 5-325 MG PO TABS: 1.0000 | ORAL_TABLET | Freq: Four times a day (QID) | ORAL | Status: DC | PRN

## 2015-11-09 MED ORDER — ARTIFICIAL TEARS OP OINT
TOPICAL_OINTMENT | OPHTHALMIC | Status: DC | PRN
  Administered 2015-11-09: 1 via OPHTHALMIC

## 2015-11-09 MED ORDER — METHYLPREDNISOLONE ACETATE 80 MG/ML IJ SUSP
INTRAMUSCULAR | Status: AC
  Filled 2015-11-09: qty 1

## 2015-11-09 MED ORDER — MIDAZOLAM HCL 2 MG/2ML IJ SOLN
2.0000 mg | Freq: Once | INTRAMUSCULAR | Status: AC
  Administered 2015-11-09: 2 mg via INTRAVENOUS

## 2015-11-09 MED ORDER — SUGAMMADEX SODIUM 200 MG/2ML IV SOLN
INTRAVENOUS | Status: DC | PRN
  Administered 2015-11-09: 200 mg via INTRAVENOUS

## 2015-11-09 MED ORDER — ONDANSETRON HCL 4 MG/2ML IJ SOLN
INTRAMUSCULAR | Status: DC | PRN
  Administered 2015-11-09: 4 mg via INTRAVENOUS

## 2015-11-09 MED ORDER — BUPIVACAINE-EPINEPHRINE (PF) 0.5% -1:200000 IJ SOLN
INTRAMUSCULAR | Status: AC
  Filled 2015-11-09: qty 30

## 2015-11-09 MED ORDER — SUGAMMADEX SODIUM 200 MG/2ML IV SOLN
INTRAVENOUS | Status: AC
  Filled 2015-11-09: qty 2

## 2015-11-09 MED ORDER — LACTATED RINGERS IV SOLN
INTRAVENOUS | Status: DC
  Administered 2015-11-09 (×3): via INTRAVENOUS

## 2015-11-09 MED ORDER — EPHEDRINE SULFATE 50 MG/ML IJ SOLN
INTRAMUSCULAR | Status: DC | PRN
  Administered 2015-11-09: 10 mg via INTRAVENOUS

## 2015-11-09 MED ORDER — SCOPOLAMINE 1 MG/3DAYS TD PT72
MEDICATED_PATCH | TRANSDERMAL | Status: AC
  Administered 2015-11-09: 1.5 mg via TRANSDERMAL
  Filled 2015-11-09: qty 1

## 2015-11-09 MED ORDER — MIDAZOLAM HCL 2 MG/2ML IJ SOLN
INTRAMUSCULAR | Status: AC
  Administered 2015-11-09: 2 mg via INTRAVENOUS
  Filled 2015-11-09: qty 2

## 2015-11-09 MED ORDER — METHYLPREDNISOLONE ACETATE 80 MG/ML IJ SUSP
INTRAMUSCULAR | Status: DC | PRN
  Administered 2015-11-09: 80 mg via INTRA_ARTICULAR

## 2015-11-09 MED ORDER — DEXAMETHASONE SODIUM PHOSPHATE 10 MG/ML IJ SOLN
INTRAMUSCULAR | Status: DC | PRN
Start: 2015-11-09 — End: 2015-11-09
  Administered 2015-11-09: 10 mg via INTRAVENOUS

## 2015-11-09 MED ORDER — BUPIVACAINE-EPINEPHRINE 0.5% -1:200000 IJ SOLN
INTRAMUSCULAR | Status: DC | PRN
  Administered 2015-11-09: 5 mL

## 2015-11-09 MED ORDER — FENTANYL CITRATE (PF) 100 MCG/2ML IJ SOLN
INTRAMUSCULAR | Status: DC | PRN
  Administered 2015-11-09: 100 ug via INTRAVENOUS
  Administered 2015-11-09: 50 ug via INTRAVENOUS

## 2015-11-09 MED ORDER — MIDAZOLAM HCL 2 MG/2ML IJ SOLN
INTRAMUSCULAR | Status: AC
  Filled 2015-11-09: qty 2

## 2015-11-09 MED ORDER — PROMETHAZINE HCL 12.5 MG PO TABS: 12.5000 mg | ORAL_TABLET | Freq: Four times a day (QID) | ORAL | Status: DC | PRN

## 2015-11-09 MED ORDER — FENTANYL CITRATE (PF) 250 MCG/5ML IJ SOLN
INTRAMUSCULAR | Status: AC
  Filled 2015-11-09: qty 5

## 2015-11-09 MED ORDER — SCOPOLAMINE 1 MG/3DAYS TD PT72
1.0000 | MEDICATED_PATCH | TRANSDERMAL | Status: DC
  Administered 2015-11-09: 1.5 mg via TRANSDERMAL
  Filled 2015-11-09: qty 1

## 2015-11-09 MED ORDER — ROCURONIUM BROMIDE 100 MG/10ML IV SOLN
INTRAVENOUS | Status: DC | PRN
  Administered 2015-11-09: 30 mg via INTRAVENOUS

## 2015-11-09 MED ORDER — 0.9 % SODIUM CHLORIDE (POUR BTL) OPTIME
TOPICAL | Status: DC | PRN
  Administered 2015-11-09: 1000 mL

## 2015-11-09 MED ORDER — DEXTROSE 5 % IV SOLN
10.0000 mg | INTRAVENOUS | Status: DC | PRN
  Administered 2015-11-09: 20 ug/min via INTRAVENOUS

## 2015-11-09 MED ORDER — SODIUM CHLORIDE 0.9 % IR SOLN
Status: DC | PRN
  Administered 2015-11-09 (×2): 3000 mL

## 2015-11-09 SURGICAL SUPPLY — 42 items
BLADE GREAT WHITE 4.2 (BLADE) ×2 IMPLANT
BUR VERTEX HOODED 4.5 (BURR) ×2 IMPLANT
CANNULA SHOULDER 7CM (CANNULA) ×2 IMPLANT
CANNULA TWIST IN 8.25X7CM (CANNULA) IMPLANT
COVER SURGICAL LIGHT HANDLE (MISCELLANEOUS) ×1 IMPLANT
DRAPE ORTHO SPLIT 77X108 STRL (DRAPES) ×4
DRAPE STERI 35X30 U-POUCH (DRAPES) ×2 IMPLANT
DRAPE SURG ORHT 6 SPLT 77X108 (DRAPES) ×2 IMPLANT
DRAPE U-SHAPE 47X51 STRL (DRAPES) ×2 IMPLANT
DRSG ADAPTIC 3X8 NADH LF (GAUZE/BANDAGES/DRESSINGS) ×1 IMPLANT
DRSG EMULSION OIL 3X3 NADH (GAUZE/BANDAGES/DRESSINGS) ×2 IMPLANT
DRSG PAD ABDOMINAL 8X10 ST (GAUZE/BANDAGES/DRESSINGS) ×2 IMPLANT
DURAPREP 26ML APPLICATOR (WOUND CARE) ×2 IMPLANT
GAUZE SPONGE 4X4 12PLY STRL (GAUZE/BANDAGES/DRESSINGS) ×2 IMPLANT
GLOVE BIO SURGEON STRL SZ8 (GLOVE) ×2 IMPLANT
GLOVE BIOGEL PI IND STRL 8 (GLOVE) ×2 IMPLANT
GLOVE BIOGEL PI INDICATOR 8 (GLOVE) ×2
GLOVE SS N UNI LF 8.0 STRL (GLOVE) ×2 IMPLANT
GOWN STRL REUS W/ TWL LRG LVL3 (GOWN DISPOSABLE) ×1 IMPLANT
GOWN STRL REUS W/ TWL XL LVL3 (GOWN DISPOSABLE) ×2 IMPLANT
GOWN STRL REUS W/TWL LRG LVL3 (GOWN DISPOSABLE) ×4
GOWN STRL REUS W/TWL XL LVL3 (GOWN DISPOSABLE) ×4
KIT BASIN OR (CUSTOM PROCEDURE TRAY) ×2 IMPLANT
KIT ROOM TURNOVER OR (KITS) ×2 IMPLANT
MANIFOLD NEPTUNE II (INSTRUMENTS) ×2 IMPLANT
NEEDLE 22X1 1/2 (OR ONLY) (NEEDLE) ×1 IMPLANT
NS IRRIG 1000ML POUR BTL (IV SOLUTION) ×2 IMPLANT
PACK ARTHROSCOPY DSU (CUSTOM PROCEDURE TRAY) ×2 IMPLANT
PAD ARMBOARD 7.5X6 YLW CONV (MISCELLANEOUS) ×4 IMPLANT
SET ARTHROSCOPY TUBING (MISCELLANEOUS) ×2
SET ARTHROSCOPY TUBING LN (MISCELLANEOUS) ×1 IMPLANT
SLING ARM FOAM STRAP XLG (SOFTGOODS) ×1 IMPLANT
SPONGE GAUZE 4X4 12PLY STER LF (GAUZE/BANDAGES/DRESSINGS) ×1 IMPLANT
SPONGE LAP 4X18 X RAY DECT (DISPOSABLE) ×2 IMPLANT
SUT ETHILON 3 0 PS 1 (SUTURE) ×2 IMPLANT
SYR CONTROL 10ML LL (SYRINGE) IMPLANT
TAPE CLOTH SURG 6X10 WHT LF (GAUZE/BANDAGES/DRESSINGS) ×1 IMPLANT
TOWEL OR 17X24 6PK STRL BLUE (TOWEL DISPOSABLE) ×2 IMPLANT
TOWEL OR 17X26 10 PK STRL BLUE (TOWEL DISPOSABLE) ×2 IMPLANT
TUBE CONNECTING 12X1/4 (SUCTIONS) ×2 IMPLANT
WAND HAND CNTRL MULTIVAC 90 (MISCELLANEOUS) ×2 IMPLANT
WATER STERILE IRR 1000ML POUR (IV SOLUTION) ×2 IMPLANT

## 2015-11-09 NOTE — Transfer of Care (Signed)
Immediate Anesthesia Transfer of Care Note  Patient: [name]  Procedure(s) Performed: Procedure(s): ARTHROSCOPY SHOULDER-acromioplasty, distal clavicle resection and debridement (Right)  Patient Location: PACU  Anesthesia Type:GA combined with regional for post-op pain  Level of Consciousness: awake, alert , oriented and patient cooperative  Airway & Oxygen Therapy: Patient Spontanous Breathing and Patient connected to nasal cannula oxygen  Post-op Assessment: Report given to RN, Post -op Vital signs reviewed and stable and Patient moving all extremities  Post vital signs: Reviewed and stable  Last Vitals:  Filed Vitals:   11/09/15 1212 11/09/15 1217  BP: 138/60 137/72  Pulse: 85 83  Temp:    Resp: 25 14    Last Pain:  Filed Vitals:   11/09/15 1457  PainSc: 8       Patients Stated Pain Goal: 4 ([id number])  Complications: No apparent anesthesia complications

## 2015-11-09 NOTE — Interval H&P Note (Signed)
History and Physical Interval Note:  11/09/2015 12:10 PM  [name]  has presented today for surgery, with the diagnosis of Right shoulder impingement  The various methods of treatment have been discussed with the patient and family. After consideration of risks, benefits and other options for treatment, the patient has consented to  Procedure(s): ARTHROSCOPY SHOULDER (Right) as a surgical intervention .  The patient's history has been reviewed, patient examined, no change in status, stable for surgery.  I have reviewed the patient's chart and labs.  Questions were answered to the patient's satisfaction.     [name]

## 2015-11-09 NOTE — Op Note (Signed)
NAMESHARIECE, RUGGIANO NO.:  [id number]  MEDICAL RECORD NO.:  [id number]  LOCATION:  [facility]                         FACILITY:  [facility]  PHYSICIAN:  Monico Blitz. Sadako Cegielski, M.D.DATE OF BIRTH:  [birth year]  DATE OF PROCEDURE:  11/09/2015 DATE OF DISCHARGE:  11/09/2015                              OPERATIVE REPORT   PREOPERATIVE DIAGNOSES: 1. Right shoulder impingement. 2. Right shoulder AC pain.  POSTOPERATIVE DIAGNOSES: 1. Right shoulder impingement. 2. Right shoulder AC pain.  PROCEDURES: 1. Right shoulder arthroscopic acromioplasty. 2. Right shoulder arthroscopic debridement. 3. Right shoulder arthroscopic AC resection.  ANESTHESIA:  General and block.  ATTENDING SURGEON:  Monico Blitz. Rhona Raider, M.D.  ASSISTANT:  Loni Dolly, PA.  INDICATION FOR PROCEDURE:  The patient is a 60 year old woman with about a year of right shoulder pain.  This is persisted despite physical therapy, pills, and 2 injections.  By MRI scan, she has things consistent with impingement and AC degeneration.  She has pain which limits her ability to use her arm and to rest.  She is offered an arthroscopy.  Informed operative consent was obtained after discussion of possible complications including reaction to anesthesia and infection.  SUMMARY OF FINDINGS AND PROCEDURE:  Under general anesthesia and a block, a left shoulder arthroscopy was performed.  Glenohumeral joint showed some significant degenerative change grade 4 in a focal area of the humeral head.  She has 4-5 small loose bodies which I removed.  I performed a chondroplasty as well.  The biceps tendon looked normal in the glenohumeral joint.  The rotator cuff had some partial thickness tearing, but nothing full thickness.  In the subacromial space, she again had some irritation on the cuff, but no tear worthy of repair. She had a moderately prominent subacromial morphology addressed with acromioplasty and had bone-on-bone contact  at the [employer] joint addressed with a formal AC resection removing about a centimeter of the distal clavicle.  She was closed primarily and will hopefully go home same day. There is some potential she may stay overnight for issues related to her sleep apnea.  DESCRIPTION OF PROCEDURE:  The patient was taken to the operating suite, where general anesthetic was applied without difficulty.  She was positioned in a beach-chair position and prepped and draped in normal sterile fashion.  After the administration of preop IV clindamycin and an appropriate time out, an arthroscopy of the right shoulder was performed through total of 3 portals.  Findings were as noted above and procedure consisted predominantly of the removal of loose bodies and chondroplasty of the glenohumeral joint.  We then performed an acromioplasty with the bur in the lateral position followed by transfer of bur to the posterior position.  I then performed a thorough bursectomy and examined the cuff, and again no tear worthy of repair was found.  She had bone-on-bone contact with the [name] joint, and I performed a formal AC resection with the bur in the anterior position.  The shoulder was thoroughly irrigated, followed by reapproximation of portals loosely with nylon.  Adaptic was applied followed by dry gauze and tape.  Estimated blood loss and fluids, obtain from the anesthesia records.  DISPOSITION:  The  patient was extubated in the operating room and taken to recovery in stable condition.  Plans were for her to potentially go home same day though she may stay overnight due to her sleep apnea.  She does go home, I will contact her by phone tonight.     Monico Blitz Rhona Raider, M.D.     PGD/MEDQ  D:  11/09/2015  T:  11/09/2015  Job:  [id number]

## 2015-11-09 NOTE — Anesthesia Preprocedure Evaluation (Addendum)
Anesthesia Evaluation  Patient identified by MRN, date of birth, ID band Patient awake    Reviewed: Allergy & Precautions, NPO status , Patient's Chart, lab work & pertinent test results  History of Anesthesia Complications (+) PONV and history of anesthetic complications  Airway Mallampati: III  TM Distance: >3 FB Neck ROM: Full    Dental  (+) Teeth Intact, Dental Advisory Given   Pulmonary neg pulmonary ROS, asthma , sleep apnea ,    Pulmonary exam normal        Cardiovascular hypertension, negative cardio ROS Normal cardiovascular exam     Neuro/Psych PSYCHIATRIC DISORDERS Anxiety Depression negative neurological ROS  negative psych ROS   GI/Hepatic Neg liver ROS, GERD  ,  Endo/Other  negative endocrine ROSdiabetes  Renal/GU negative Renal ROS  negative genitourinary   Musculoskeletal  (+) Arthritis , Fibromyalgia -  Abdominal   Peds negative pediatric ROS (+)  Hematology negative hematology ROS (+)   Anesthesia Other Findings   Reproductive/Obstetrics negative OB ROS                            Anesthesia Physical Anesthesia Plan  ASA: III  Anesthesia Plan: General   Post-op Pain Management: GA combined w/ Regional for post-op pain   Induction: Intravenous  Airway Management Planned: Oral ETT  Additional Equipment:   Intra-op Plan:   Post-operative Plan: Extubation in OR  Informed Consent: I have reviewed the patients History and Physical, chart, labs and discussed the procedure including the risks, benefits and alternatives for the proposed anesthesia with the patient or authorized representative who has indicated his/her understanding and acceptance.   Dental advisory given  Plan Discussed with: CRNA and Anesthesiologist  Anesthesia Plan Comments:         Anesthesia Quick Evaluation

## 2015-11-09 NOTE — Anesthesia Procedure Notes (Signed)
Procedure Name: Intubation Date/Time: 11/09/2015 1:08 PM Performed by: Suzy Bouchard Pre-anesthesia Checklist: Patient identified, Emergency Drugs available, Suction available, Patient being monitored and Timeout performed Patient Re-evaluated:Patient Re-evaluated prior to inductionOxygen Delivery Method: Circle system utilized Preoxygenation: Pre-oxygenation with 100% oxygen Intubation Type: IV induction Laryngoscope Size: Miller and 2 Grade View: Grade I Tube type: Oral Tube size: 7.5 mm Number of attempts: 1 Airway Equipment and Method: Stylet Placement Confirmation: ETT inserted through vocal cords under direct vision,  CO2 detector and breath sounds checked- equal and bilateral Secured at: 23 cm Tube secured with: Tape Dental Injury: Teeth and Oropharynx as per pre-operative assessment

## 2015-11-09 NOTE — Anesthesia Postprocedure Evaluation (Signed)
Anesthesia Post Note  Patient: [name]  Procedure(s) Performed: Procedure(s) (LRB): ARTHROSCOPY SHOULDER-acromioplasty, distal clavicle resection and debridement (Right)  Patient location during evaluation: PACU Anesthesia Type: General and Regional Level of consciousness: awake and alert Pain management: pain level controlled Vital Signs Assessment: post-procedure vital signs reviewed and stable Respiratory status: spontaneous breathing, nonlabored ventilation, respiratory function stable and patient connected to nasal cannula oxygen Cardiovascular status: blood pressure returned to baseline and stable Postop Assessment: no signs of nausea or vomiting Anesthetic complications: no    Last Vitals:  Filed Vitals:   11/09/15 1217 11/09/15 1457  BP: 137/72 122/77  Pulse: 83 86  Temp:  36.4 C  Resp: 14 14    Last Pain:  Filed Vitals:   11/09/15 1459  PainSc: 8                  Conway Fedora,W. EDMOND

## 2015-11-09 NOTE — Op Note (Signed)
Holly NOVICKI [id number] 11/09/2015   PRE-OP DIAGNOSIS: right shoulder AC pain and impingement  POST-OP DIAGNOSIS: same  PROCEDURE: right shoulder arthroscopy  ANESTHESIA: general and block  Earon Rivest G   Dictation #:  [id number]

## 2015-11-10 ENCOUNTER — Encounter (HOSPITAL_COMMUNITY): Payer: Self-pay | Admitting: Orthopaedic Surgery

## 2015-11-18 ENCOUNTER — Encounter: Payer: Self-pay | Admitting: Family Medicine

## 2015-11-18 ENCOUNTER — Ambulatory Visit (INDEPENDENT_AMBULATORY_CARE_PROVIDER_SITE_OTHER): Payer: BLUE CROSS/BLUE SHIELD | Admitting: Family Medicine

## 2015-11-18 VITALS — BP 114/78 | HR 94 | Temp 98.4°F | Ht 67.0 in | Wt 288.0 lb

## 2015-11-18 DIAGNOSIS — R5383 Other fatigue: Secondary | ICD-10-CM | POA: Diagnosis not present

## 2015-11-18 DIAGNOSIS — Z9889 Other specified postprocedural states: Secondary | ICD-10-CM | POA: Diagnosis not present

## 2015-11-18 DIAGNOSIS — R7401 Elevation of levels of liver transaminase levels: Secondary | ICD-10-CM

## 2015-11-18 DIAGNOSIS — R740 Nonspecific elevation of levels of transaminase and lactic acid dehydrogenase [LDH]: Secondary | ICD-10-CM

## 2015-11-18 DIAGNOSIS — E1140 Type 2 diabetes mellitus with diabetic neuropathy, unspecified: Secondary | ICD-10-CM

## 2015-11-18 NOTE — Progress Notes (Addendum)
[place] [facility] [address] [place], [place] [address] [phone]  Date:  11/18/2015   Name:  [name]   DOB:  Feb 10, 1956   MRN:  [id number]  PCP:  Annye Asa, MD    Chief Complaint: Fatigue   History of Present Illness:  [name] is a 60 y.o. very pleasant female patient who presents with the following:  History of DM.  She had a laproscopic right shoulder repair done just over a week ago.  She is here today because she notes that "I don't have any energy." she last took hydrocodone a week ago and 1 tramadol several days ago so she knows she is not tired because of pain medicine She is back at work already- 40h a week, and feels very tired by the end of her work day Got her stitches removed yesterday.  The shoulder seems to be doing very well  Besides her shoulder she does not have any pain.   She did have a right sided mastectomy.    She is eating ok She is so tired at night that she just comes home from work and goes right to bed.  She is not SOB, no CP, no fevers or chills. Appetite is ok  Prior to her operation her hg was normal and creat was a bit elevated at 1.35.  She does not have a prior history of kidney disease  Her arm is out of the sling and she is working on her ROM.  Her husband passed away- she now lives alone.  He died 33 years ago.   She works at a desk all day No changes to her meds recently  Patient Active Problem List   Diagnosis Date Noted  . Rheumatoid arthritis ([place]) 06/24/2015  . Diverticulitis of colon without hemorrhage 09/21/2014  . Asthma with acute exacerbation 07/14/2014  . Left ear pain 07/06/2014  . Cough 07/06/2014  . Wheezing 06/25/2014  . Sinusitis 06/25/2014  . Jaw pain 05/12/2014  . Lumbar radiculopathy, acute 08/21/2013  . Gout of big toe 03/10/2013  . Routine general medical examination at a health care facility 10/08/2012  . OSA (obstructive  sleep apnea) 04/23/2012  . Croup 04/02/2012  . Bronchitis, acute 02/08/2012  . Diabetes mellitus, type 2 ([place]) 12/10/2011  . Shortness of breath 12/06/2011  . Vomiting and diarrhea 09/04/2011  . Personal history of colonic polyps - adenoma 08/14/2011  . Fatigue 08/03/2011  . Ear canal dryness 08/03/2011  . Conjunctivitis 03/27/2011  . Neck pain 01/16/2011  . Palpitations 09/21/2010  . Obesity 09/21/2010  . PAC (premature atrial contraction) 09/21/2010  . Leg pain, posterior 08/25/2010  . COUGH VARIANT ASTHMA 06/23/2010  . AIRWAY OBSTRUCTION 06/20/2010  . ATTENTION DEFICIT DISORDER, INATTENTIVE TYPE 09/22/2009  . VIRAL MENINGITIS, HX OF 04/29/2009  . INSOMNIA-SLEEP DISORDER-UNSPEC 02/15/2009  . VITAMIN D DEFICIENCY 03/11/2008  . Fibromyalgia 03/05/2008  . ANXIETY STATE, UNSPECIFIED 08/06/2007  . HYPERPARATHYROIDISM, HX OF 08/06/2007  . ALLERGIC RHINITIS 07/05/2007  . OSTEOARTHRITIS 02/28/2007  . Edema 11/01/2006  . DEPRESSION 06/25/2006  . HYPERTENSION 06/25/2006  . Esophageal reflux 06/25/2006    Past Medical History  Diagnosis Date  . GERD (gastroesophageal reflux disease)   . Depression   . Anxiety   . Allergic rhinitis   . Breast cancer ([place])   . Viral meningitis   . Vitamin D deficiency   . Hyperparathyroidism   . Family history of breast  cancer in first degree relative   . Colon polyps   . Edema   . Obesity   . PONV (postoperative nausea and vomiting)     occasional  . Hypertension     controlled by medications  . Diabetes mellitus     borderline but takes metformin  . Osteoarthritis     RA  . Sleep apnea     Past Surgical History  Procedure Laterality Date  . Breast surgery      Tran flap due to breast cancer  . Knee arthroscopy      bilateral  . Parathyroid resection    . Total knee arthroplasty  06/18/08    [facility]  . Mastectomy  01/13/94    Right breast  . Septoplasty  1980  . Tonsillectomy    . Parathyroidectomy    . Hand surgery       dog bite, right hand  . Hand surgery      trauma, left hand  . Tumor removal  1982    Right great toe  . Rectal abscess    . Cesarean section  1988  . Abdominal hysterectomy  1998  . Colonoscopy    . Adenoidectomy    . Left knee replacement  2010  . Shoulder arthroscopy Right 11/09/2015    Procedure: ARTHROSCOPY SHOULDER-acromioplasty, distal clavicle resection and debridement;  Surgeon: Melrose Nakayama, MD;  Location: [facility];  Service: Orthopedics;  Laterality: Right;    Social History  Substance Use Topics  . Smoking status: Never Smoker   . Smokeless tobacco: Never Used  . Alcohol Use: Yes     Comment: occ.    Family History  Problem Relation Age of Onset  . Breast cancer    . Ovarian cancer    . Colon polyps    . Emphysema Father   . Lung cancer Father   . Breast cancer Maternal Aunt   . Colon cancer Maternal Aunt   . Breast cancer Paternal Grandfather   . Heart disease Paternal Grandfather   . Prostate cancer Paternal Grandfather   . Asthma Daughter     "seasonal"  . Arthritis Maternal Grandmother     Allergies  Allergen Reactions  . Penicillins Hives and Other (See Comments)    Has patient had a PCN reaction causing immediate rash, facial/tongue/throat swelling, SOB or lightheadedness with hypotension: no Has patient had a PCN reaction causing severe rash involving mucus membranes or skin necrosis: no Has patient had a PCN reaction that required hospitalization no Has patient had a PCN reaction occurring within the last 10 years: no If all of the above answers are "NO", then may proceed with Cephalosporin use.     Medication list has been reviewed and updated.  Current Outpatient Prescriptions on File Prior to Visit  Medication Sig Dispense Refill  . Calcium Carbonate (CALTRATE 600) 1500 MG TABS Take 1,500 mg by mouth daily.     . Cinnamon 500 MG capsule Take 1,000 mg by mouth daily.    Marland Kitchen esomeprazole (NEXIUM) 40 MG capsule TAKE 1 CAPSULE (40 MG TOTAL) BY  MOUTH DAILY. (Patient taking differently: Take 40 mg by mouth daily. ) 90 capsule 1  . ferrous sulfate 325 (65 FE) MG EC tablet Take 325 mg by mouth daily.    . fluticasone (FLONASE) 50 MCG/ACT nasal spray Place 2 sprays into both nostrils daily. (Patient taking differently: Place 2 sprays into both nostrils daily as needed for allergies. ) 16 g 1  . furosemide (  LASIX) 40 MG tablet TAKE 1 TABLET BY MOUTH 2 TIMES DAILY (Patient taking differently: TAKE 1 TABLET BY MOUTH DAILY) 60 tablet 3  . Ginger 500 MG CAPS Take 500 mg by mouth daily.     Marland Kitchen leflunomide (ARAVA) 10 MG tablet Take 10 mg by mouth daily.    . Melatonin 5 MG TABS Take 5 mg by mouth at bedtime as needed (For sleep.).    Marland Kitchen metFORMIN (GLUCOPHAGE) 500 MG tablet TAKE 1 TABLET (500 MG TOTAL) BY MOUTH 2 (TWO) TIMES DAILY WITH A MEAL. (Patient taking differently: TAKE 1 TABLET (500 MG TOTAL) BY MOUTH DAILY WITH A MEAL.) 180 tablet 1  . Multiple Vitamins-Minerals (MULTIVITAMIN & MINERAL PO) Take 1 tablet by mouth daily.    Marland Kitchen telmisartan-hydrochlorothiazide (MICARDIS HCT) 80-25 MG tablet Take 1 tablet by mouth daily. 90 tablet 1  . traMADol (ULTRAM) 50 MG tablet Take 50 mg by mouth every 6 (six) hours as needed (For pain.).     . Turmeric 500 MG CAPS Take 500 mg by mouth daily.      No current facility-administered medications on file prior to visit.    Review of Systems:  As per HPI- otherwise negative.   Physical Examination: Filed Vitals:   11/18/15 1415  BP: 114/78  Pulse: 94  Temp: 98.4 F (36.9 C)   Filed Vitals:   11/18/15 1415  Height: 5\' 7"  (1.702 m)  Weight: 288 lb (130.636 kg)   Body mass index is 45.1 kg/(m^2). Ideal Body Weight: Weight in (lb) to have BMI = 25: 159.3  GEN: WDWN, NAD, Non-toxic, A & O x 3, obese, looks well HEENT: Atraumatic, Normocephalic. Neck supple. No masses, No LAD.  Bilateral TM wnl, oropharynx normal.  PEERL,EOMI.   Ears and Nose: No external deformity. CV: RRR, No M/G/R. No JVD. No  thrill. No extra heart sounds. PULM: CTA B, no wheezes, crackles, rhonchi. No retractions. No resp. distress. No accessory muscle use. ABD: S, NT, ND, +BS. No rebound. No HSM. EXTR: No c/c/e NEURO Normal gait.  PSYCH: Normally interactive. Conversant. Not depressed or anxious appearing.  Calm demeanor.  Right shoulder: evidence of recent operation but appears to be healing well, no heat, redness, or abnormal inflammation to suggest infection    Assessment and Plan: Other fatigue - Plan: CBC, Comprehensive metabolic panel  Type 2 diabetes mellitus with diabetic neuropathy, without long-term current use of insulin (HCC) - Plan: Hemoglobin A1c  Post-operative state - Plan: CBC, Comprehensive metabolic panel  Here today with fatigue.  Shoulder surgery a week ago, she is back at full time work now.  Will check labs as above to make sure her kidney function is back to normal and that her hg did not drop. However suspect that she may just be tired from her operation and may need more time to rest and recover.  She will take the rest of the day and also tomorrow off, and will work a reduced schedule next week if needed Will plan further follow- up pending labs.   Signed Lamar Blinks, MD  Received her labs and called 6/23.  Results for orders placed or performed in visit on 11/18/15  CBC  Result Value Ref Range   WBC 10.5 4.0 - 10.5 K/uL   RBC 4.49 3.87 - 5.11 Mil/uL   Platelets 300.0 150.0 - 400.0 K/uL   Hemoglobin 12.4 12.0 - 15.0 g/dL   HCT 38.1 36.0 - 46.0 %   MCV 84.8 78.0 - 100.0 fl  MCHC 32.7 30.0 - 36.0 g/dL   RDW 17.7 (H) 11.5 - 15.5 %  Comprehensive metabolic panel  Result Value Ref Range   Sodium 134 (L) 135 - 145 mEq/L   Potassium 3.7 3.5 - 5.1 mEq/L   Chloride 95 (L) 96 - 112 mEq/L   CO2 27 19 - 32 mEq/L   Glucose, Bld 114 (H) 70 - 99 mg/dL   BUN 27 (H) 6 - 23 mg/dL   Creatinine, Ser 1.18 0.40 - 1.20 mg/dL   Total Bilirubin 0.4 0.2 - 1.2 mg/dL   Alkaline  Phosphatase 202 (H) 39 - 117 U/L   AST 124 (H) 0 - 37 U/L   ALT 123 (H) 0 - 35 U/L   Total Protein 7.5 6.0 - 8.3 g/dL   Albumin 4.0 3.5 - 5.2 g/dL   Calcium 9.9 8.4 - 10.5 mg/dL   GFR 49.44 (L) >60.00 mL/min  Hemoglobin A1c  Result Value Ref Range   Hgb A1c MFr Bld 6.1 4.6 - 6.5 %   Her hemoglobin is fine, renal function is better but her liver function tests are up. She has not had this issue in the past.  does not drink etoh and is not taking tylenol.  She is not having belly pain.  She needs a RUQ [name]- will arrange for her asap and will also try to add on an acute hep panel for her.  In the meantime if she has any belly pain, vomiting or other concerns she will seek care

## 2015-11-18 NOTE — Progress Notes (Signed)
Pre visit review using our clinic review tool, if applicable. No additional management support is needed unless otherwise documented below in the visit note. 

## 2015-11-18 NOTE — Patient Instructions (Signed)
I will be in touch with your labs asap In the meantime allow yourself extra rest/ sleep as needed.  I think that your body needs more time to recover from your recent operation.  Let me know if you are not feeling better over the next few days-. Sooner if worse.

## 2015-11-19 LAB — COMPREHENSIVE METABOLIC PANEL
ALT: 123 U/L — ABNORMAL HIGH (ref 0–35)
AST: 124 U/L — ABNORMAL HIGH (ref 0–37)
Albumin: 4 g/dL (ref 3.5–5.2)
Alkaline Phosphatase: 202 U/L — ABNORMAL HIGH (ref 39–117)
BUN: 27 mg/dL — ABNORMAL HIGH (ref 6–23)
CO2: 27 mEq/L (ref 19–32)
Calcium: 9.9 mg/dL (ref 8.4–10.5)
Chloride: 95 mEq/L — ABNORMAL LOW (ref 96–112)
Creatinine, Ser: 1.18 mg/dL (ref 0.40–1.20)
GFR: 49.44 mL/min — ABNORMAL LOW (ref >60.00)
Glucose, Bld: 114 mg/dL — ABNORMAL HIGH (ref 70–99)
Potassium: 3.7 mEq/L (ref 3.5–5.1)
Sodium: 134 mEq/L — ABNORMAL LOW (ref 135–145)
Total Bilirubin: 0.4 mg/dL (ref 0.2–1.2)
Total Protein: 7.5 g/dL (ref 6.0–8.3)

## 2015-11-19 LAB — HEMOGLOBIN A1C: Hgb A1c MFr Bld: 6.1 % (ref 4.6–6.5)

## 2015-11-19 LAB — CBC
HCT: 38.1 % (ref 36.0–46.0)
Hemoglobin: 12.4 g/dL (ref 12.0–15.0)
MCHC: 32.7 g/dL (ref 30.0–36.0)
MCV: 84.8 fl (ref 78.0–100.0)
Platelets: 300 10*3/uL (ref 150.0–400.0)
RBC: 4.49 Mil/uL (ref 3.87–5.11)
RDW: 17.7 % — ABNORMAL HIGH (ref 11.5–15.5)
WBC: 10.5 10*3/uL (ref 4.0–10.5)

## 2015-11-19 NOTE — Addendum Note (Signed)
Addended by: Lamar Blinks C on: 11/19/2015 02:20 PM   Modules accepted: Orders

## 2015-11-20 ENCOUNTER — Ambulatory Visit (HOSPITAL_BASED_OUTPATIENT_CLINIC_OR_DEPARTMENT_OTHER)
Admission: RE | Admit: 2015-11-20 | Discharge: 2015-11-20 | Disposition: A | Discharge status: Home / Self Care | Payer: BLUE CROSS/BLUE SHIELD | Source: Ambulatory Visit | Attending: Family Medicine | Admitting: Family Medicine

## 2015-11-20 DIAGNOSIS — R740 Nonspecific elevation of levels of transaminase and lactic acid dehydrogenase [LDH]: Secondary | ICD-10-CM | POA: Insufficient documentation

## 2015-11-20 DIAGNOSIS — R7401 Elevation of levels of liver transaminase levels: Secondary | ICD-10-CM

## 2015-11-23 ENCOUNTER — Encounter: Payer: Self-pay | Admitting: Family Medicine

## 2015-11-23 DIAGNOSIS — R7401 Elevation of levels of liver transaminase levels: Secondary | ICD-10-CM

## 2015-11-23 DIAGNOSIS — R740 Nonspecific elevation of levels of transaminase and lactic acid dehydrogenase [LDH]: Principal | ICD-10-CM

## 2015-11-24 ENCOUNTER — Other Ambulatory Visit: Payer: Self-pay | Admitting: Family Medicine

## 2015-11-24 DIAGNOSIS — R7401 Elevation of levels of liver transaminase levels: Secondary | ICD-10-CM

## 2015-11-24 DIAGNOSIS — R740 Nonspecific elevation of levels of transaminase and lactic acid dehydrogenase [LDH]: Principal | ICD-10-CM

## 2015-11-26 ENCOUNTER — Other Ambulatory Visit (INDEPENDENT_AMBULATORY_CARE_PROVIDER_SITE_OTHER): Payer: BLUE CROSS/BLUE SHIELD

## 2015-11-26 DIAGNOSIS — R740 Nonspecific elevation of levels of transaminase and lactic acid dehydrogenase [LDH]: Secondary | ICD-10-CM | POA: Diagnosis not present

## 2015-11-26 DIAGNOSIS — R7401 Elevation of levels of liver transaminase levels: Secondary | ICD-10-CM

## 2015-11-26 LAB — HEPATIC FUNCTION PANEL
ALT: 79 U/L — ABNORMAL HIGH (ref 6–29)
AST: 44 U/L — ABNORMAL HIGH (ref 10–35)
Albumin: 3.9 g/dL (ref 3.6–5.1)
Alkaline Phosphatase: 156 U/L — ABNORMAL HIGH (ref 33–130)
Bilirubin, Direct: 0.1 mg/dL (ref <=0.2)
Indirect Bilirubin: 0.3 mg/dL (ref 0.2–1.2)
Total Bilirubin: 0.4 mg/dL (ref 0.2–1.2)
Total Protein: 6.6 g/dL (ref 6.1–8.1)

## 2015-11-29 ENCOUNTER — Telehealth: Payer: Self-pay | Admitting: Family Medicine

## 2015-11-29 DIAGNOSIS — M069 Rheumatoid arthritis, unspecified: Secondary | ICD-10-CM

## 2015-11-29 NOTE — Telephone Encounter (Signed)
Called her- it seems that something is wrong with her mychart and she did not get the new liver function tests released to her and I could not reply to her most recent message.  Reassured that her LFT numbers are much better.  She is feeling better.  Will refer for a 2nd opinion about ?RA and she will schedule to see me in one month to check on her labs.  Continue to stay off [place].

## 2015-12-08 ENCOUNTER — Encounter: Payer: Self-pay | Admitting: Family Medicine

## 2015-12-28 ENCOUNTER — Ambulatory Visit (INDEPENDENT_AMBULATORY_CARE_PROVIDER_SITE_OTHER): Payer: BLUE CROSS/BLUE SHIELD | Admitting: Family Medicine

## 2015-12-28 ENCOUNTER — Encounter: Payer: Self-pay | Admitting: Family Medicine

## 2015-12-28 VITALS — BP 100/58 | HR 105 | Temp 99.0°F | Ht 67.0 in | Wt 293.6 lb

## 2015-12-28 DIAGNOSIS — T7840XD Allergy, unspecified, subsequent encounter: Secondary | ICD-10-CM | POA: Diagnosis not present

## 2015-12-28 DIAGNOSIS — E559 Vitamin D deficiency, unspecified: Secondary | ICD-10-CM | POA: Diagnosis not present

## 2015-12-28 DIAGNOSIS — E1140 Type 2 diabetes mellitus with diabetic neuropathy, unspecified: Secondary | ICD-10-CM | POA: Diagnosis not present

## 2015-12-28 DIAGNOSIS — M069 Rheumatoid arthritis, unspecified: Secondary | ICD-10-CM

## 2015-12-28 DIAGNOSIS — K219 Gastro-esophageal reflux disease without esophagitis: Secondary | ICD-10-CM

## 2015-12-28 DIAGNOSIS — E669 Obesity, unspecified: Secondary | ICD-10-CM

## 2015-12-28 MED ORDER — RANITIDINE HCL 150 MG PO TABS: 150.0000 mg | ORAL_TABLET | Freq: Two times a day (BID) | ORAL | 5 refills | Status: DC

## 2015-12-28 MED ORDER — NEOMYCIN-POLYMYXIN-HC 3.5-10000-1 OT SOLN: 3.0000 [drp] | Freq: Four times a day (QID) | OTIC | 0 refills | Status: DC

## 2015-12-28 MED ORDER — ESOMEPRAZOLE MAGNESIUM 40 MG PO CPDR: 40.0000 mg | DELAYED_RELEASE_CAPSULE | Freq: Every day | ORAL | 1 refills | Status: DC | PRN

## 2015-12-28 MED ORDER — CETIRIZINE HCL 10 MG PO TABS
10.0000 mg | ORAL_TABLET | Freq: Two times a day (BID) | ORAL | 5 refills | Status: DC
Start: 2015-12-28 — End: 2017-01-25

## 2015-12-28 NOTE — Patient Instructions (Addendum)
Witch Hazel Astringent to lesions as needed Sarna anti itch lotion   Food Choices for Gastroesophageal Reflux Disease, Adult When you have gastroesophageal reflux disease (GERD), the foods you eat and your eating habits are very important. Choosing the right foods can help ease the discomfort of GERD. WHAT GENERAL GUIDELINES DO I NEED TO FOLLOW?  Choose fruits, vegetables, whole grains, low-fat dairy products, and low-fat meat, fish, and poultry.  Limit fats such as oils, salad dressings, butter, nuts, and avocado.  Keep a food diary to identify foods that cause symptoms.  Avoid foods that cause reflux. These may be different for different people.  Eat frequent small meals instead of three large meals each day.  Eat your meals slowly, in a relaxed setting.  Limit fried foods.  Cook foods using methods other than frying.  Avoid drinking alcohol.  Avoid drinking large amounts of liquids with your meals.  Avoid bending over or lying down until 2-3 hours after eating. WHAT FOODS ARE NOT RECOMMENDED? The following are some foods and drinks that may worsen your symptoms: Vegetables Tomatoes. Tomato juice. Tomato and spaghetti sauce. Chili peppers. Onion and garlic. Horseradish. Fruits Oranges, grapefruit, and lemon (fruit and juice). Meats High-fat meats, fish, and poultry. This includes hot dogs, ribs, ham, sausage, salami, and bacon. Dairy Whole milk and chocolate milk. Sour cream. Cream. Butter. Ice cream. Cream cheese.  Beverages Coffee and tea, with or without caffeine. Carbonated beverages or energy drinks. Condiments Hot sauce. Barbecue sauce.  Sweets/Desserts Chocolate and cocoa. Donuts. Peppermint and spearmint. Fats and Oils High-fat foods, including [place] fries and potato chips. Other Vinegar. Strong spices, such as black pepper, white pepper, red pepper, cayenne, curry powder, cloves, ginger, and chili powder. The items listed above may not be a complete list  of foods and beverages to avoid. Contact your dietitian for more information.   This information is not intended to replace advice given to you by your health care provider. Make sure you discuss any questions you have with your health care provider.   Document Released: 05/15/2005 Document Revised: 06/05/2014 Document Reviewed: 03/19/2013 Elsevier Interactive Patient Education 2016 [place] gastroesophageal reflux patient instructions her  DASH Eating Plan DASH stands for "Dietary Approaches to Stop Hypertension." The DASH eating plan is a healthy eating plan that has been shown to reduce high blood pressure (hypertension). Additional health benefits may include reducing the risk of type 2 diabetes mellitus, heart disease, and stroke. The DASH eating plan may also help with weight loss. WHAT DO I NEED TO KNOW ABOUT THE DASH EATING PLAN? For the DASH eating plan, you will follow these general guidelines:  Choose foods with a percent daily value for sodium of less than 5% (as listed on the food label).  Use salt-free seasonings or herbs instead of table salt or sea salt.  Check with your health care provider or pharmacist before using salt substitutes.  Eat lower-sodium products, often labeled as "lower sodium" or "no salt added."  Eat fresh foods.  Eat more vegetables, fruits, and low-fat dairy products.  Choose whole grains. Look for the word "whole" as the first word in the ingredient list.  Choose fish and skinless chicken or [place] more often than red meat. Limit fish, poultry, and meat to 6 oz (170 g) each day.  Limit sweets, desserts, sugars, and sugary drinks.  Choose heart-healthy fats.  Limit cheese to 1 oz (28 g) per day.  Eat more home-cooked food and less restaurant, buffet, and fast  food.  Limit fried foods.  Cook foods using methods other than frying.  Limit canned vegetables. If you do use them, rinse them well to decrease the sodium.  When eating  at a restaurant, ask that your food be prepared with less salt, or no salt if possible. WHAT FOODS CAN I EAT? Seek help from a dietitian for individual calorie needs. Grains Whole grain or whole wheat bread. Brown rice. Whole grain or whole wheat pasta. Quinoa, bulgur, and whole grain cereals. Low-sodium cereals. Corn or whole wheat flour tortillas. Whole grain cornbread. Whole grain crackers. Low-sodium crackers. Vegetables Fresh or frozen vegetables (raw, steamed, roasted, or grilled). Low-sodium or reduced-sodium tomato and vegetable juices. Low-sodium or reduced-sodium tomato sauce and paste. Low-sodium or reduced-sodium canned vegetables.  Fruits All fresh, canned (in natural juice), or frozen fruits. Meat and Other Protein Products Ground beef (85% or leaner), grass-fed beef, or beef trimmed of fat. Skinless chicken or [place]. Ground chicken or [place]. Pork trimmed of fat. All fish and seafood. Eggs. Dried beans, peas, or lentils. Unsalted nuts and seeds. Unsalted canned beans. Dairy Low-fat dairy products, such as skim or 1% milk, 2% or reduced-fat cheeses, low-fat ricotta or cottage cheese, or plain low-fat yogurt. Low-sodium or reduced-sodium cheeses. Fats and Oils Tub margarines without trans fats. Light or reduced-fat mayonnaise and salad dressings (reduced sodium). Avocado. Safflower, olive, or canola oils. Natural peanut or almond butter. Other Unsalted popcorn and pretzels. The items listed above may not be a complete list of recommended foods or beverages. Contact your dietitian for more options. WHAT FOODS ARE NOT RECOMMENDED? Grains White bread. White pasta. White rice. Refined cornbread. Bagels and croissants. Crackers that contain trans fat. Vegetables Creamed or fried vegetables. Vegetables in a cheese sauce. Regular canned vegetables. Regular canned tomato sauce and paste. Regular tomato and vegetable juices. Fruits Dried fruits. Canned fruit in light or heavy syrup.  Fruit juice. Meat and Other Protein Products Fatty cuts of meat. Ribs, chicken wings, bacon, sausage, bologna, salami, chitterlings, fatback, hot dogs, bratwurst, and packaged luncheon meats. Salted nuts and seeds. Canned beans with salt. Dairy Whole or 2% milk, cream, half-and-half, and cream cheese. Whole-fat or sweetened yogurt. Full-fat cheeses or blue cheese. Nondairy creamers and whipped toppings. Processed cheese, cheese spreads, or cheese curds. Condiments Onion and garlic salt, seasoned salt, table salt, and sea salt. Canned and packaged gravies. Worcestershire sauce. Tartar sauce. Barbecue sauce. Teriyaki sauce. Soy sauce, including reduced sodium. Steak sauce. Fish sauce. Oyster sauce. Cocktail sauce. Horseradish. Ketchup and mustard. Meat flavorings and tenderizers. Bouillon cubes. Hot sauce. Tabasco sauce. Marinades. Taco seasonings. Relishes. Fats and Oils Butter, stick margarine, lard, shortening, ghee, and bacon fat. Coconut, palm kernel, or palm oils. Regular salad dressings. Other Pickles and olives. Salted popcorn and pretzels. The items listed above may not be a complete list of foods and beverages to avoid. Contact your dietitian for more information. WHERE CAN I FIND MORE INFORMATION? National Heart, Lung, and Blood Institute: travelstabloid.com   This information is not intended to replace advice given to you by your health care provider. Make sure you discuss any questions you have with your health care provider.   Document Released: 05/04/2011 Document Revised: 06/05/2014 Document Reviewed: 03/19/2013 Elsevier Interactive Patient Education 2016 [employer]. e.

## 2015-12-28 NOTE — Progress Notes (Signed)
Pre visit review using our clinic review tool, if applicable. No additional management support is needed unless otherwise documented below in the visit note. 

## 2016-01-04 ENCOUNTER — Telehealth: Payer: Self-pay | Admitting: Family Medicine

## 2016-01-04 ENCOUNTER — Ambulatory Visit (INDEPENDENT_AMBULATORY_CARE_PROVIDER_SITE_OTHER): Payer: BLUE CROSS/BLUE SHIELD | Admitting: Internal Medicine

## 2016-01-04 VITALS — BP 126/72 | HR 110 | Temp 98.8°F | Resp 14 | Ht 67.0 in | Wt 292.2 lb

## 2016-01-04 DIAGNOSIS — J45901 Unspecified asthma with (acute) exacerbation: Secondary | ICD-10-CM

## 2016-01-04 MED ORDER — ALBUTEROL SULFATE HFA 108 (90 BASE) MCG/ACT IN AERS: 2.0000 | INHALATION_SPRAY | Freq: Four times a day (QID) | RESPIRATORY_TRACT | 1 refills | Status: DC | PRN

## 2016-01-04 MED ORDER — PREDNISONE 10 MG PO TABS: ORAL_TABLET | ORAL | 0 refills | Status: DC

## 2016-01-04 MED ORDER — CLARITHROMYCIN ER 500 MG PO TB24: 1000.0000 mg | ORAL_TABLET | Freq: Every day | ORAL | 0 refills | Status: DC

## 2016-01-04 NOTE — Patient Instructions (Addendum)
Rest, fluids , tylenol  For cough:  Take Mucinex DM twice a day as needed until better  Albuterol as needed   Prednisone for few days, stop if the sugar is > 180   For nasal congestion: Use OTC Nasocort or Flonase : 2 nasal sprays on each side of the nose in the morning until you feel better   Avoid decongestants such as  Pseudoephedrine or phenylephrine     Take the antibiotic as prescribed  (biaxin ) only if no better in 5 days  Call if not gradually better over the next  10 days  Call anytime if the symptoms are severe

## 2016-01-04 NOTE — Progress Notes (Signed)
Pre visit review using our clinic review tool, if applicable. No additional management support is needed unless otherwise documented below in the visit note. 

## 2016-01-04 NOTE — Telephone Encounter (Signed)
Ventolin Rx was sent

## 2016-01-04 NOTE — Telephone Encounter (Signed)
Pt saw Dr. Larose Kells today in PCP absence. PLease advise.

## 2016-01-04 NOTE — Telephone Encounter (Signed)
Relation to [id number] Call back number:[phone] Pharmacy: [facility], [place] [phone] ([phone] (Fax)     Reason for call:  Patient states at today appointment PCP and patient discussed an inhaler. Patient requesting Rx. Please advise

## 2016-01-04 NOTE — Progress Notes (Signed)
Subjective:    Patient ID: [name], female    DOB: [birth year], 60 y.o.   MRN: [id number]  DOS:  01/04/2016 Type of visit - description :acute  Interval history: Symptoms started yesterday with cough, wheezing, chest congestion. Some sore throats, throat  feel irritated.  Denies fever chills No nausea, vomiting, diarrhea. No sinus pain, congestion or nasal discharge.   Review of Systems  See above Past Medical History:  Diagnosis Date  . Allergic rhinitis   . Anxiety   . Breast cancer ([place])   . Colon polyps   . Depression   . Diabetes mellitus    borderline but takes metformin  . Edema   . Family history of breast cancer in first degree relative   . GERD (gastroesophageal reflux disease)   . Hyperparathyroidism   . Hypertension    controlled by medications  . Obesity   . Osteoarthritis    RA  . PONV (postoperative nausea and vomiting)    occasional  . Sleep apnea   . Viral meningitis   . Vitamin D deficiency     Past Surgical History:  Procedure Laterality Date  . ABDOMINAL HYSTERECTOMY  1998  . ADENOIDECTOMY    . BREAST SURGERY     Tran flap due to breast cancer  . CESAREAN SECTION  1988  . HAND SURGERY     dog bite, right hand  . HAND SURGERY     trauma, left hand  . KNEE ARTHROSCOPY     bilateral  . left knee replacement  2010  . MASTECTOMY  01/13/94   Right breast  . parathyroid resection    . PARATHYROIDECTOMY    . rectal abscess    . SEPTOPLASTY  1980  . SHOULDER ARTHROSCOPY Right 11/09/2015   Procedure: ARTHROSCOPY SHOULDER-acromioplasty, distal clavicle resection and debridement;  Surgeon: Melrose Nakayama, MD;  Location: [facility];  Service: Orthopedics;  Laterality: Right;  . TOE SURGERY Right    paronychia and adenoma removed  . TONSILLECTOMY    . TOTAL KNEE ARTHROPLASTY  06/18/08   [facility]  . TUMOR REMOVAL  1982   , scalp    Social History   Social History  . Marital status: Widowed    Spouse name: N/A  . Number of children: 1    . Years of education: N/A   Occupational History  . [employer] History Main Topics  . Smoking status: Never Smoker  . Smokeless tobacco: Never Used  . Alcohol use Yes     Comment: occ.  . Drug use: No  . Sexual activity: Not on file   Other Topics Concern  . Not on file   Social History Narrative  . No narrative on file        Medication List       Accurate as of 01/04/16 11:59 PM. Always use your most recent med list.          albuterol 108 (90 Base) MCG/ACT inhaler Commonly known as:  VENTOLIN HFA Inhale 2 puffs into the lungs every 6 (six) hours as needed for wheezing or shortness of breath.   CALTRATE 600 1500 (600 Ca) MG Tabs tablet Generic drug:  calcium carbonate Take 1,500 mg by mouth daily.   cetirizine 10 MG tablet Commonly known as:  ZYRTEC Take 1 tablet (10 mg total) by mouth 2 (two) times daily.   Cinnamon 500 MG capsule Take 1,000 mg by mouth daily.   clarithromycin 500  MG 24 hr tablet Commonly known as:  BIAXIN XL Take 2 tablets (1,000 mg total) by mouth daily.   esomeprazole 40 MG capsule Commonly known as:  NEXIUM Take 1 capsule (40 mg total) by mouth daily as needed. TAKE 1 CAPSULE (40 MG TOTAL) BY MOUTH DAILY.   ferrous sulfate 325 (65 FE) MG EC tablet Take 325 mg by mouth daily.   fluticasone 50 MCG/ACT nasal spray Commonly known as:  FLONASE Place 2 sprays into both nostrils daily.   furosemide 40 MG tablet Commonly known as:  LASIX TAKE 1 TABLET BY MOUTH 2 TIMES DAILY   Ginger 500 MG Caps Take 500 mg by mouth daily.   Melatonin 5 MG Tabs Take 5 mg by mouth at bedtime as needed (For sleep.).   metFORMIN 500 MG tablet Commonly known as:  GLUCOPHAGE TAKE 1 TABLET (500 MG TOTAL) BY MOUTH 2 (TWO) TIMES DAILY WITH A MEAL.   MULTIVITAMIN & MINERAL PO Take 1 tablet by mouth daily.   neomycin-polymyxin-hydrocortisone otic solution Commonly known as:  CORTISPORIN Place 3 drops into the left ear 4  (four) times daily.   predniSONE 10 MG tablet Commonly known as:  DELTASONE 3 tabs x 3 days, 2 tabs x 3 days, 1 tab x 3 days   ranitidine 150 MG tablet Commonly known as:  ZANTAC Take 1 tablet (150 mg total) by mouth 2 (two) times daily.   telmisartan-hydrochlorothiazide 80-25 MG tablet Commonly known as:  MICARDIS HCT Take 1 tablet by mouth daily.   Turmeric 500 MG Caps Take 500 mg by mouth daily.   VITAMIN D PO Take 1 tablet by mouth daily.          Objective:   Physical Exam BP 126/72 (BP Location: Left Arm, Patient Position: Sitting, Cuff Size: Normal)   Pulse (!) 110   Temp 98.8 F (37.1 C) (Oral)   Resp 14   Ht 5\' 7"  (1.702 m)   Wt 292 lb 4 oz (132.6 kg)   SpO2 96%   BMI 45.77 kg/m  General:   Well developed, well nourished . NAD.  HEENT:  Normocephalic . Face symmetric, atraumatic. TMs normal, nose is slightly congested, sinuses no TTP. Throat symmetric. Lungs:  CTA B. Frequent cough noted but no wheezing. Normal respiratory effort, no intercostal retractions, no accessory muscle use. Heart: RRR,  no murmur.  No pretibial edema bilaterally  Skin: Not pale. Not jaundice Neurologic:  alert & oriented X3.  Speech normal, gait appropriate for age and unassisted Psych--  Cognition and judgment appear intact.  Cooperative with normal attention span and concentration.  Behavior appropriate. No anxious or depressed appearing.      Assessment & Plan:   Mild asthma exacerbation: Patient with history of asthma presents with cough and wheezing, exam this afternoon is benign, wheezing is usually nocturnal. Likely she has an exacerbation due to mild bronchitis. Plan: see  instructions

## 2016-01-09 NOTE — Assessment & Plan Note (Signed)
She is following with rheumatology,

## 2016-01-09 NOTE — Progress Notes (Signed)
Patient ID: [name], female   DOB: 12/23/1955, 60 y.o.   MRN: [id number]   Subjective:    Patient ID: [name], female    DOB: 1955-06-13, 60 y.o.   MRN: [id number]  Chief Complaint  Patient presents with  . Establish Care    Transitions of Care-pt has questions regarding medications    HPI Patient is in today for new patient appointment. Is in usual state of health and is transferring care from her previous PMD which has changed office locations. Struggles with chronic pain from Rheumatoid Arthritis. Also notes an itch on right arm. Also notes some left ear discomfort at times. Has trouble with insomnia.falls asleep well but wakes up frequently and has trouble falling asleep. Denies CP/palp/SOB/HA/congestion/fevers/GI or GU c/o. Taking meds as prescribed  Past Medical History:  Diagnosis Date  . Allergic rhinitis   . Anxiety   . Breast cancer ([place])   . Colon polyps   . Depression   . Diabetes mellitus    borderline but takes metformin  . Edema   . Family history of breast cancer in first degree relative   . GERD (gastroesophageal reflux disease)   . Hyperparathyroidism   . Hypertension    controlled by medications  . Obesity   . Osteoarthritis    RA  . PONV (postoperative nausea and vomiting)    occasional  . Sleep apnea   . Viral meningitis   . Vitamin D deficiency     Past Surgical History:  Procedure Laterality Date  . ABDOMINAL HYSTERECTOMY  1998  . ADENOIDECTOMY    . BREAST SURGERY     Tran flap due to breast cancer  . CESAREAN SECTION  1988  . HAND SURGERY     dog bite, right hand  . HAND SURGERY     trauma, left hand  . KNEE ARTHROSCOPY     bilateral  . left knee replacement  2010  . MASTECTOMY  01/13/94   Right breast  . parathyroid resection    . PARATHYROIDECTOMY    . rectal abscess    . SEPTOPLASTY  1980  . SHOULDER ARTHROSCOPY Right 11/09/2015   Procedure: ARTHROSCOPY SHOULDER-acromioplasty, distal clavicle resection and debridement;   Surgeon: Melrose Nakayama, MD;  Location: [facility];  Service: Orthopedics;  Laterality: Right;  . TOE SURGERY Right    paronychia and adenoma removed  . TONSILLECTOMY    . TOTAL KNEE ARTHROPLASTY  06/18/08   [facility]  . TUMOR REMOVAL  1982   , scalp    Family History  Problem Relation Age of Onset  . Emphysema Father   . Lung cancer Father   . Breast cancer Maternal Aunt   . Colon cancer Maternal Aunt   . Breast cancer Paternal Grandfather   . Heart disease Paternal Grandfather   . Prostate cancer Paternal Grandfather   . Breast cancer    . Ovarian cancer    . Colon polyps    . Asthma Daughter     "seasonal"  . Arthritis Maternal Grandmother   . Arthritis Mother   . Multiple sclerosis Sister     Social History   Social History  . Marital status: Widowed    Spouse name: N/A  . Number of children: 1  . Years of education: N/A   Occupational History  . [employer] History Main Topics  . Smoking status: Never Smoker  . Smokeless tobacco: Never Used  . Alcohol use Yes  Comment: occ.  . Drug use: No  . Sexual activity: Not on file   Other Topics Concern  . Not on file   Social History Narrative  . No narrative on file    Outpatient Medications Prior to Visit  Medication Sig Dispense Refill  . Calcium Carbonate (CALTRATE 600) 1500 MG TABS Take 1,500 mg by mouth daily.     . Cholecalciferol (VITAMIN D PO) Take 1 tablet by mouth daily.    . Cinnamon 500 MG capsule Take 1,000 mg by mouth daily.    . ferrous sulfate 325 (65 FE) MG EC tablet Take 325 mg by mouth daily.    . fluticasone (FLONASE) 50 MCG/ACT nasal spray Place 2 sprays into both nostrils daily. (Patient taking differently: Place 2 sprays into both nostrils daily as needed for allergies. ) 16 g 1  . furosemide (LASIX) 40 MG tablet TAKE 1 TABLET BY MOUTH 2 TIMES DAILY (Patient taking differently: TAKE 1 TABLET BY MOUTH DAILY) 60 tablet 3  . Ginger 500 MG CAPS Take 500 mg by mouth  daily.     . Melatonin 5 MG TABS Take 5 mg by mouth at bedtime as needed (For sleep.).    Marland Kitchen metFORMIN (GLUCOPHAGE) 500 MG tablet TAKE 1 TABLET (500 MG TOTAL) BY MOUTH 2 (TWO) TIMES DAILY WITH A MEAL. (Patient taking differently: TAKE 1 TABLET (500 MG TOTAL) BY MOUTH DAILY WITH A MEAL.) 180 tablet 1  . Multiple Vitamins-Minerals (MULTIVITAMIN & MINERAL PO) Take 1 tablet by mouth daily.    Marland Kitchen telmisartan-hydrochlorothiazide (MICARDIS HCT) 80-25 MG tablet Take 1 tablet by mouth daily. 90 tablet 1  . Turmeric 500 MG CAPS Take 500 mg by mouth daily.     Marland Kitchen esomeprazole (NEXIUM) 40 MG capsule TAKE 1 CAPSULE (40 MG TOTAL) BY MOUTH DAILY. (Patient taking differently: Take 40 mg by mouth daily. ) 90 capsule 1  . leflunomide (ARAVA) 10 MG tablet Take 10 mg by mouth daily.    . traMADol (ULTRAM) 50 MG tablet Take 50 mg by mouth every 6 (six) hours as needed (For pain.).      No facility-administered medications prior to visit.     Allergies  Allergen Reactions  . Penicillins Hives and Other (See Comments)    Has patient had a PCN reaction causing immediate rash, facial/tongue/throat swelling, SOB or lightheadedness with hypotension: no Has patient had a PCN reaction causing severe rash involving mucus membranes or skin necrosis: no Has patient had a PCN reaction that required hospitalization no Has patient had a PCN reaction occurring within the last 10 years: no If all of the above answers are "NO", then may proceed with Cephalosporin use.     Review of Systems  Constitutional: Negative for fever and malaise/fatigue.  HENT: Positive for ear pain. Negative for congestion, ear discharge and tinnitus.   Eyes: Negative for blurred vision.  Respiratory: Negative for shortness of breath.   Cardiovascular: Negative for chest pain, palpitations and leg swelling.  Gastrointestinal: Negative for abdominal pain, blood in stool and nausea.  Genitourinary: Negative for dysuria and frequency.  Musculoskeletal:  Positive for joint pain and myalgias. Negative for falls.  Skin: Positive for itching and rash.  Neurological: Negative for dizziness, loss of consciousness and headaches.  Endo/Heme/Allergies: Negative for environmental allergies.  Psychiatric/Behavioral: Negative for depression. The patient has insomnia. The patient is not nervous/anxious.        Objective:    Physical Exam  Constitutional: She is oriented to person, place, and  time. She appears well-developed and well-nourished. No distress.  HENT:  Head: Normocephalic and atraumatic.  Eyes: Conjunctivae are normal.  Neck: Neck supple. No thyromegaly present.  Cardiovascular: Normal rate, regular rhythm and normal heart sounds.   No murmur heard. Pulmonary/Chest: Effort normal and breath sounds normal. No respiratory distress.  Abdominal: Soft. Bowel sounds are normal. She exhibits no distension and no mass. There is no tenderness.  Musculoskeletal: She exhibits no edema.  Lymphadenopathy:    She has no cervical adenopathy.  Neurological: She is alert and oriented to person, place, and time.  Skin: Skin is warm and dry.  Psychiatric: She has a normal mood and affect. Her behavior is normal.    BP (!) 100/58 (BP Location: Left Arm, Patient Position: Sitting, Cuff Size: Large)   Pulse (!) 105   Temp 99 F (37.2 C) (Oral)   Ht 5\' 7"  (1.702 m)   Wt 293 lb 9.6 oz (133.2 kg)   SpO2 99%   BMI 45.98 kg/m  Wt Readings from Last 3 Encounters:  01/04/16 292 lb 4 oz (132.6 kg)  12/28/15 293 lb 9.6 oz (133.2 kg)  11/18/15 288 lb (130.6 kg)     Lab Results  Component Value Date   WBC 10.5 11/18/2015   HGB 12.4 11/18/2015   HCT 38.1 11/18/2015   PLT 300.0 11/18/2015   GLUCOSE 114 (H) 11/18/2015   CHOL 182 01/21/2015   TRIG 188.0 (H) 01/21/2015   HDL 44.80 01/21/2015   LDLDIRECT 96.5 04/10/2013   LDLCALC 99 01/21/2015   ALT 79 (H) 11/26/2015   AST 44 (H) 11/26/2015   NA 134 (L) 11/18/2015   K 3.7 11/18/2015   CL 95 (L)  11/18/2015   CREATININE 1.18 11/18/2015   BUN 27 (H) 11/18/2015   CO2 27 11/18/2015   TSH 0.78 01/21/2015   INR 2.0 (H) 06/21/2008   HGBA1C 6.1 11/18/2015    Lab Results  Component Value Date   TSH 0.78 01/21/2015   Lab Results  Component Value Date   WBC 10.5 11/18/2015   HGB 12.4 11/18/2015   HCT 38.1 11/18/2015   MCV 84.8 11/18/2015   PLT 300.0 11/18/2015   Lab Results  Component Value Date   NA 134 (L) 11/18/2015   K 3.7 11/18/2015   CO2 27 11/18/2015   GLUCOSE 114 (H) 11/18/2015   BUN 27 (H) 11/18/2015   CREATININE 1.18 11/18/2015   BILITOT 0.4 11/26/2015   ALKPHOS 156 (H) 11/26/2015   AST 44 (H) 11/26/2015   ALT 79 (H) 11/26/2015   PROT 6.6 11/26/2015   ALBUMIN 3.9 11/26/2015   CALCIUM 9.9 11/18/2015   ANIONGAP 11 11/09/2015   GFR 49.44 (L) 11/18/2015   Lab Results  Component Value Date   CHOL 182 01/21/2015   Lab Results  Component Value Date   HDL 44.80 01/21/2015   Lab Results  Component Value Date   LDLCALC 99 01/21/2015   Lab Results  Component Value Date   TRIG 188.0 (H) 01/21/2015   Lab Results  Component Value Date   CHOLHDL 4 01/21/2015   Lab Results  Component Value Date   HGBA1C 6.1 11/18/2015       Assessment & Plan:   Problem List Items Addressed This Visit    Vitamin D deficiency    Encouraged daily supplements and will monitor      Esophageal reflux    Avoid offending foods, start probiotics. Do not eat large meals in late evening and consider raising head  of bed.       Relevant Medications   cetirizine (ZYRTEC) 10 MG tablet   esomeprazole (NEXIUM) 40 MG capsule   ranitidine (ZANTAC) 150 MG tablet   Obesity    Encouraged DASH diet, decrease po intake and increase exercise as tolerated. Needs 7-8 hours of sleep nightly. Avoid trans fats, eat small, frequent meals every 4-5 hours with lean proteins, complex carbs and healthy fats. Minimize simple carbs. Considering gastric bypass      Diabetes mellitus, type 2  ([facility])    hgba1c acceptable, minimize simple carbs. Increase exercise as tolerated. Continue current meds      Rheumatoid arthritis ([place])    She is following with rheumatology,        Other Visit Diagnoses    Allergic state, subsequent encounter    -  Primary   Relevant Medications   cetirizine (ZYRTEC) 10 MG tablet      I have discontinued Ms. Sherman's traMADol. I have also changed her esomeprazole. Additionally, I am having her start on cetirizine, ranitidine, and neomycin-polymyxin-hydrocortisone. Lastly, I am having her maintain her calcium carbonate, ferrous sulfate, furosemide, metFORMIN, Cinnamon, Multiple Vitamins-Minerals (MULTIVITAMIN & MINERAL PO), Ginger, Turmeric, fluticasone, telmisartan-hydrochlorothiazide, Melatonin, and Cholecalciferol (VITAMIN D PO).  Meds ordered this encounter  Medications  . cetirizine (ZYRTEC) 10 MG tablet    Sig: Take 1 tablet (10 mg total) by mouth 2 (two) times daily.    Dispense:  60 tablet    Refill:  5  . esomeprazole (NEXIUM) 40 MG capsule    Sig: Take 1 capsule (40 mg total) by mouth daily as needed. TAKE 1 CAPSULE (40 MG TOTAL) BY MOUTH DAILY.    Dispense:  90 capsule    Refill:  1  . ranitidine (ZANTAC) 150 MG tablet    Sig: Take 1 tablet (150 mg total) by mouth 2 (two) times daily.    Dispense:  60 tablet    Refill:  5  . neomycin-polymyxin-hydrocortisone (CORTISPORIN) otic solution    Sig: Place 3 drops into the left ear 4 (four) times daily.    Dispense:  10 mL    Refill:  0     Penni Homans, MD

## 2016-01-09 NOTE — Assessment & Plan Note (Signed)
hgba1c acceptable, minimize simple carbs. Increase exercise as tolerated. Continue current meds 

## 2016-01-09 NOTE — Assessment & Plan Note (Signed)
Avoid offending foods, start probiotics. Do not eat large meals in late evening and consider raising head of bed.  

## 2016-01-09 NOTE — Assessment & Plan Note (Signed)
Encouraged daily supplements and will monitor 

## 2016-01-09 NOTE — Assessment & Plan Note (Addendum)
Encouraged DASH diet, decrease po intake and increase exercise as tolerated. Needs 7-8 hours of sleep nightly. Avoid trans fats, eat small, frequent meals every 4-5 hours with lean proteins, complex carbs and healthy fats. Minimize simple carbs. Considering gastric bypass

## 2016-01-11 ENCOUNTER — Encounter (HOSPITAL_BASED_OUTPATIENT_CLINIC_OR_DEPARTMENT_OTHER): Payer: Self-pay | Admitting: Emergency Medicine

## 2016-01-11 ENCOUNTER — Emergency Department (HOSPITAL_BASED_OUTPATIENT_CLINIC_OR_DEPARTMENT_OTHER): Payer: BLUE CROSS/BLUE SHIELD

## 2016-01-11 ENCOUNTER — Telehealth: Payer: Self-pay | Admitting: Family Medicine

## 2016-01-11 ENCOUNTER — Emergency Department (HOSPITAL_BASED_OUTPATIENT_CLINIC_OR_DEPARTMENT_OTHER)
Admission: EM | Admit: 2016-01-11 | Discharge: 2016-01-11 | Disposition: A | Discharge status: Home / Self Care | Payer: BLUE CROSS/BLUE SHIELD | Attending: Emergency Medicine | Admitting: Emergency Medicine

## 2016-01-11 DIAGNOSIS — H6092 Unspecified otitis externa, left ear: Secondary | ICD-10-CM | POA: Diagnosis not present

## 2016-01-11 DIAGNOSIS — J385 Laryngeal spasm: Secondary | ICD-10-CM

## 2016-01-11 DIAGNOSIS — Z7984 Long term (current) use of oral hypoglycemic drugs: Secondary | ICD-10-CM | POA: Diagnosis not present

## 2016-01-11 DIAGNOSIS — Z79899 Other long term (current) drug therapy: Secondary | ICD-10-CM | POA: Diagnosis not present

## 2016-01-11 DIAGNOSIS — I10 Essential (primary) hypertension: Secondary | ICD-10-CM | POA: Insufficient documentation

## 2016-01-11 DIAGNOSIS — E119 Type 2 diabetes mellitus without complications: Secondary | ICD-10-CM | POA: Insufficient documentation

## 2016-01-11 DIAGNOSIS — Z853 Personal history of malignant neoplasm of breast: Secondary | ICD-10-CM | POA: Diagnosis not present

## 2016-01-11 DIAGNOSIS — R0602 Shortness of breath: Secondary | ICD-10-CM | POA: Diagnosis present

## 2016-01-11 MED ORDER — IPRATROPIUM-ALBUTEROL 0.5-2.5 (3) MG/3ML IN SOLN
3.0000 mL | Freq: Once | RESPIRATORY_TRACT | Status: AC
  Administered 2016-01-11: 3 mL via RESPIRATORY_TRACT

## 2016-01-11 MED ORDER — METOCLOPRAMIDE HCL 5 MG/ML IJ SOLN: 10.0000 mg | Freq: Once | INTRAMUSCULAR | Status: DC

## 2016-01-11 MED ORDER — IPRATROPIUM-ALBUTEROL 0.5-2.5 (3) MG/3ML IN SOLN
RESPIRATORY_TRACT | Status: AC
  Administered 2016-01-11: 3 mL via RESPIRATORY_TRACT
  Filled 2016-01-11: qty 3

## 2016-01-11 MED ORDER — DIPHENHYDRAMINE HCL 25 MG PO TABS: 25.0000 mg | ORAL_TABLET | Freq: Three times a day (TID) | ORAL | 0 refills | Status: DC | PRN

## 2016-01-11 MED ORDER — METOCLOPRAMIDE HCL 10 MG PO TABS
10.0000 mg | ORAL_TABLET | Freq: Once | ORAL | Status: AC
  Administered 2016-01-11: 10 mg via ORAL
  Filled 2016-01-11: qty 1

## 2016-01-11 MED ORDER — ALBUTEROL SULFATE (2.5 MG/3ML) 0.083% IN NEBU
2.5000 mg | INHALATION_SOLUTION | Freq: Once | RESPIRATORY_TRACT | Status: AC
  Administered 2016-01-11: 2.5 mg via RESPIRATORY_TRACT

## 2016-01-11 MED ORDER — CLOTRIMAZOLE 1 % EX SOLN: CUTANEOUS | 0 refills | Status: DC

## 2016-01-11 MED ORDER — DIPHENHYDRAMINE HCL 50 MG/ML IJ SOLN: 25.0000 mg | Freq: Once | INTRAMUSCULAR | Status: DC

## 2016-01-11 MED ORDER — DEXAMETHASONE SODIUM PHOSPHATE 10 MG/ML IJ SOLN
10.0000 mg | Freq: Once | INTRAMUSCULAR | Status: AC
  Administered 2016-01-11: 10 mg via INTRAMUSCULAR
  Filled 2016-01-11: qty 1

## 2016-01-11 MED ORDER — PREDNISONE 20 MG PO TABS: ORAL_TABLET | ORAL | 0 refills | Status: DC

## 2016-01-11 MED ORDER — ALBUTEROL SULFATE (2.5 MG/3ML) 0.083% IN NEBU
INHALATION_SOLUTION | RESPIRATORY_TRACT | Status: AC
  Administered 2016-01-11: 2.5 mg via RESPIRATORY_TRACT
  Filled 2016-01-11: qty 3

## 2016-01-11 MED ORDER — EPINEPHRINE 0.3 MG/0.3ML IJ SOAJ: 0.3000 mg | Freq: Once | INTRAMUSCULAR | 0 refills | Status: DC | PRN

## 2016-01-11 MED ORDER — DIPHENHYDRAMINE HCL 25 MG PO CAPS
25.0000 mg | ORAL_CAPSULE | Freq: Once | ORAL | Status: AC
  Administered 2016-01-11: 25 mg via ORAL
  Filled 2016-01-11: qty 1

## 2016-01-11 NOTE — Telephone Encounter (Signed)
Per the patient's chart, she is currently being seen at the ED.

## 2016-01-11 NOTE — ED Triage Notes (Signed)
Is being txed for bronchitis

## 2016-01-11 NOTE — Discharge Instructions (Signed)
Take prednisone as prescribed starting tomorrow. You may use Benadryl as needed for itching, swelling or difficulty sleeping. Continue to use Zyrtec daily aspirin as prescribed. Switch to using only hypoallergenic soaps and detergents. Return immediately for any sensation of oral or airway swelling or difficulty breathing. Follow up with your primary physician to assure resolution of your symptoms. Use antifungal drops in the left ear. If continued to have symptoms, follow-up with ENT.

## 2016-01-11 NOTE — ED Provider Notes (Signed)
[facility] Provider Note   CSN: [id number] Arrival date & time: 01/11/16  1450     History   Chief Complaint Chief Complaint  Patient presents with  . Shortness of Breath    HPI [name] is a 60 y.o. female.  HPI Patient presents with shortness of breath, cough for the past week. Recently diagnosed with asthma and bronchitis. Given a prescription for antibiotics which the patient has not filled. Denies any recent fever or chills. States that while at her office today became increasingly short of breath. Felt like her throat was closing. States she became anxious and scared. Denied any chest pain, intraoral swelling, rash, nausea or vomiting. Has had a similar episode in the past which required hospitalization. Hasn't been given albuterol treatment and states this has improved her symptoms.  Patient also complains of itching and discomfort to the left ear. States she sticks multiple objects into the ear to try to scratch. This been on for 1 year. Has seen a otolaryngologist who removed several small hairs that were thought to be causing her symptoms. Her symptoms have persisted. Denies any hearing changes. Past Medical History:  Diagnosis Date  . Allergic rhinitis   . Anxiety   . Breast cancer ([place])   . Colon polyps   . Depression   . Diabetes mellitus    borderline but takes metformin  . Edema   . Family history of breast cancer in first degree relative   . GERD (gastroesophageal reflux disease)   . Hyperparathyroidism   . Hypertension    controlled by medications  . Obesity   . Osteoarthritis    RA  . PONV (postoperative nausea and vomiting)    occasional  . Sleep apnea   . Viral meningitis   . Vitamin D deficiency     Patient Active Problem List   Diagnosis Date Noted  . Rheumatoid arthritis ([place]) 06/24/2015  . Diverticulitis of colon without hemorrhage 09/21/2014  . Asthma with acute exacerbation 07/14/2014  . Left ear pain 07/06/2014  .  Cough 07/06/2014  . Sinusitis 06/25/2014  . Jaw pain 05/12/2014  . Lumbar radiculopathy, acute 08/21/2013  . Gout of big toe 03/10/2013  . Routine general medical examination at a health care facility 10/08/2012  . OSA (obstructive sleep apnea) 04/23/2012  . Croup 04/02/2012  . Bronchitis, acute 02/08/2012  . Diabetes mellitus, type 2 ([place]) 12/10/2011  . Shortness of breath 12/06/2011  . Personal history of colonic polyps - adenoma 08/14/2011  . Fatigue 08/03/2011  . Ear canal dryness 08/03/2011  . Conjunctivitis 03/27/2011  . Neck pain 01/16/2011  . Palpitations 09/21/2010  . Obesity 09/21/2010  . PAC (premature atrial contraction) 09/21/2010  . Leg pain, posterior 08/25/2010  . COUGH VARIANT ASTHMA 06/23/2010  . AIRWAY OBSTRUCTION 06/20/2010  . ATTENTION DEFICIT DISORDER, INATTENTIVE TYPE 09/22/2009  . VIRAL MENINGITIS, HX OF 04/29/2009  . INSOMNIA-SLEEP DISORDER-UNSPEC 02/15/2009  . Vitamin D deficiency 03/11/2008  . Fibromyalgia 03/05/2008  . ANXIETY STATE, UNSPECIFIED 08/06/2007  . HYPERPARATHYROIDISM, HX OF 08/06/2007  . ALLERGIC RHINITIS 07/05/2007  . OSTEOARTHRITIS 02/28/2007  . Edema 11/01/2006  . DEPRESSION 06/25/2006  . HYPERTENSION 06/25/2006  . Esophageal reflux 06/25/2006    Past Surgical History:  Procedure Laterality Date  . ABDOMINAL HYSTERECTOMY  1998  . ADENOIDECTOMY    . BREAST SURGERY     Tran flap due to breast cancer  . CESAREAN SECTION  1988  . HAND SURGERY     dog bite, right  hand  . HAND SURGERY     trauma, left hand  . KNEE ARTHROSCOPY     bilateral  . left knee replacement  2010  . MASTECTOMY  01/13/94   Right breast  . parathyroid resection    . PARATHYROIDECTOMY    . rectal abscess    . SEPTOPLASTY  1980  . SHOULDER ARTHROSCOPY Right 11/09/2015   Procedure: ARTHROSCOPY SHOULDER-acromioplasty, distal clavicle resection and debridement;  Surgeon: Melrose Nakayama, MD;  Location: [facility];  Service: Orthopedics;  Laterality: Right;  . TOE  SURGERY Right    paronychia and adenoma removed  . TONSILLECTOMY    . TOTAL KNEE ARTHROPLASTY  06/18/08   [facility]  . TUMOR REMOVAL  1982   , scalp    OB History    Gravida Para Term Preterm AB Living   1 1           SAB TAB Ectopic Multiple Live Births                   Home Medications    Prior to Admission medications   Medication Sig Start Date End Date Taking? Authorizing Provider  albuterol (VENTOLIN HFA) 108 (90 Base) MCG/ACT inhaler Inhale 2 puffs into the lungs every 6 (six) hours as needed for wheezing or shortness of breath. 01/04/16   Colon Branch, MD  Calcium Carbonate (CALTRATE 600) 1500 MG TABS Take 1,500 mg by mouth daily.     Historical Provider, MD  cetirizine (ZYRTEC) 10 MG tablet Take 1 tablet (10 mg total) by mouth 2 (two) times daily. 12/28/15   Mosie Lukes, MD  Cholecalciferol (VITAMIN D PO) Take 1 tablet by mouth daily.    Historical Provider, MD  Cinnamon 500 MG capsule Take 1,000 mg by mouth daily.    Historical Provider, MD  clotrimazole (LOTRIMIN) 1 % external solution 4 drops in left ear 4 times daily 7 days 01/11/16   Julianne Rice, MD  diphenhydrAMINE (BENADRYL) 25 MG tablet Take 1 tablet (25 mg total) by mouth every 8 (eight) hours as needed for itching, allergies or sleep. 01/11/16   Julianne Rice, MD  EPINEPHrine 0.3 mg/0.3 mL IJ SOAJ injection Inject 0.3 mLs (0.3 mg total) into the muscle once as needed (Throat or facial swelling or difficulty breathing). 01/11/16   Julianne Rice, MD  esomeprazole (NEXIUM) 40 MG capsule Take 1 capsule (40 mg total) by mouth daily as needed. TAKE 1 CAPSULE (40 MG TOTAL) BY MOUTH DAILY. 12/28/15   Mosie Lukes, MD  ferrous sulfate 325 (65 FE) MG EC tablet Take 325 mg by mouth daily.    Historical Provider, MD  fluticasone (FLONASE) 50 MCG/ACT nasal spray Place 2 sprays into both nostrils daily. Patient taking differently: Place 2 sprays into both nostrils daily as needed for allergies.  06/29/15   Percell Miller Saguier, PA-C    furosemide (LASIX) 40 MG tablet TAKE 1 TABLET BY MOUTH 2 TIMES DAILY Patient taking differently: TAKE 1 TABLET BY MOUTH DAILY 02/22/15   Midge Minium, MD  Ginger 500 MG CAPS Take 500 mg by mouth daily.     Historical Provider, MD  Melatonin 5 MG TABS Take 5 mg by mouth at bedtime as needed (For sleep.).    Historical Provider, MD  metFORMIN (GLUCOPHAGE) 500 MG tablet TAKE 1 TABLET (500 MG TOTAL) BY MOUTH 2 (TWO) TIMES DAILY WITH A MEAL. Patient taking differently: TAKE 1 TABLET (500 MG TOTAL) BY MOUTH DAILY WITH A MEAL. 03/31/15  Midge Minium, MD  Multiple Vitamins-Minerals (MULTIVITAMIN & MINERAL PO) Take 1 tablet by mouth daily.    Historical Provider, MD  neomycin-polymyxin-hydrocortisone (CORTISPORIN) otic solution Place 3 drops into the left ear 4 (four) times daily. 12/28/15   Mosie Lukes, MD  predniSONE (DELTASONE) 20 MG tablet 3 tabs po day one, then 2 po daily x 4 days 01/12/16   Julianne Rice, MD  ranitidine (ZANTAC) 150 MG tablet Take 1 tablet (150 mg total) by mouth 2 (two) times daily. 12/28/15   Mosie Lukes, MD  telmisartan-hydrochlorothiazide (MICARDIS HCT) 80-25 MG tablet Take 1 tablet by mouth daily. 08/12/15   Midge Minium, MD  Turmeric 500 MG CAPS Take 500 mg by mouth daily.     Historical Provider, MD    Family History Family History  Problem Relation Age of Onset  . Emphysema Father   . Lung cancer Father   . Breast cancer Maternal Aunt   . Colon cancer Maternal Aunt   . Breast cancer Paternal Grandfather   . Heart disease Paternal Grandfather   . Prostate cancer Paternal Grandfather   . Breast cancer    . Ovarian cancer    . Colon polyps    . Asthma Daughter     "seasonal"  . Arthritis Maternal Grandmother   . Arthritis Mother   . Multiple sclerosis Sister     Social History Social History  Substance Use Topics  . Smoking status: Never Smoker  . Smokeless tobacco: Never Used  . Alcohol use Yes     Comment: occ.     Allergies    Penicillins   Review of Systems Review of Systems  Constitutional: Negative for chills, fever and unexpected weight change.  HENT: Positive for ear pain. Negative for ear discharge, facial swelling, hearing loss, sinus pressure and sore throat.   Respiratory: Positive for cough and shortness of breath. Negative for chest tightness, wheezing and stridor.   Cardiovascular: Negative for chest pain, palpitations and leg swelling.  Gastrointestinal: Negative for abdominal pain, diarrhea, nausea and vomiting.  Genitourinary: Negative for dysuria and flank pain.  Musculoskeletal: Negative for arthralgias, back pain, joint swelling and myalgias.  Neurological: Negative for dizziness, weakness, light-headedness, numbness and headaches.  Psychiatric/Behavioral: The patient is nervous/anxious.   All other systems reviewed and are negative.    Physical Exam Updated Vital Signs BP 120/67 (BP Location: Right Arm)   Pulse 93   Temp 99.3 F (37.4 C) (Oral)   Resp 18   SpO2 100%   Physical Exam  Constitutional: She is oriented to person, place, and time. She appears well-developed and well-nourished.  HENT:  Head: Normocephalic and atraumatic.  Mouth/Throat: Oropharynx is clear and moist. No oropharyngeal exudate.  Mildly swollen left auditory canal. There is a white covering of the tympanic membrane. No erythema or evidence of trauma. Right TM and auditory canal are normal.  No intraoral swelling.  Eyes: EOM are normal. Pupils are equal, round, and reactive to light.  Neck: Normal range of motion. Neck supple.  Cardiovascular: Normal rate and regular rhythm.  Exam reveals no gallop and no friction rub.   No murmur heard. Pulmonary/Chest: Effort normal and breath sounds normal. No stridor. No respiratory distress. She has no wheezes. She has no rales. She exhibits no tenderness.  Abdominal: Soft. Bowel sounds are normal. There is no tenderness. There is no rebound and no guarding.   Musculoskeletal: Normal range of motion. She exhibits no edema or tenderness.  No lower  extremity swelling, asymmetry or tenderness.  Lymphadenopathy:    She has no cervical adenopathy.  Neurological: She is alert and oriented to person, place, and time.  Moves all extremity is without deficit. Sensation is fully intact.  Skin: Skin is warm and dry. No rash noted. No erythema.  Psychiatric: Her behavior is normal.  Mildly anxious  Nursing note and vitals reviewed.    ED Treatments / Results  Labs (all labs ordered are listed, but only abnormal results are displayed) Labs Reviewed - No data to display  EKG  EKG Interpretation None       Radiology Dg Chest 2 View  Result Date: 01/11/2016 CLINICAL DATA:  Cough and shortness of breath for 1 week EXAM: CHEST  2 VIEW COMPARISON:  07/06/2014 FINDINGS: The heart size and mediastinal contours are within normal limits. Both lungs are clear. The visualized skeletal structures are unremarkable. IMPRESSION: No active cardiopulmonary disease. Electronically Signed   By: Inez Catalina M.D.   On: 01/11/2016 15:38    Procedures Procedures (including critical care time)  Medications Ordered in ED Medications  ipratropium-albuterol (DUONEB) 0.5-2.5 (3) MG/3ML nebulizer solution 3 mL (3 mLs Nebulization Given 01/11/16 1507)  albuterol (PROVENTIL) (2.5 MG/3ML) 0.083% nebulizer solution 2.5 mg (2.5 mg Nebulization Given 01/11/16 1507)  dexamethasone (DECADRON) injection 10 mg (10 mg Intramuscular Given 01/11/16 1535)  diphenhydrAMINE (BENADRYL) capsule 25 mg (25 mg Oral Given 01/11/16 1535)  metoCLOPramide (REGLAN) tablet 10 mg (10 mg Oral Given 01/11/16 1535)     Initial Impression / Assessment and Plan / ED Course  I have reviewed the triage vital signs and the nursing notes.  Pertinent labs & imaging results that were available during my care of the patient were reviewed by me and considered in my medical decision making (see chart for  details).  Clinical Course  Patient says she's feeling much better after medication. No evidence of airway compromise. Chest x-ray is clear. Likely is up or airway spasm which may be allergic related. She's been observed in emergency department for 3 hours. She is requesting to be discharged home. We'll give prescription for short course of steroids and EpiPen.  Given the chronic nature of her left ear itching suspect possible fungal otitis externa. We'll give prescription for clotrimazole drops. She is advised to follow-up with ENT should her symptoms persist. She's also been advised to follow-up with her primary physician to assure resolution of her symptoms and proper referral. Return precautions have been given and she understands the need to return immediately for any facial swelling, difficulty breathing, persistent vomiting or for any concerns.    Final Clinical Impressions(s) / ED Diagnoses   Final diagnoses:  Laryngospasm  Otitis externa, left    New Prescriptions New Prescriptions   CLOTRIMAZOLE (LOTRIMIN) 1 % EXTERNAL SOLUTION    4 drops in left ear 4 times daily 7 days   DIPHENHYDRAMINE (BENADRYL) 25 MG TABLET    Take 1 tablet (25 mg total) by mouth every 8 (eight) hours as needed for itching, allergies or sleep.   EPINEPHRINE 0.3 MG/0.3 ML IJ SOAJ INJECTION    Inject 0.3 mLs (0.3 mg total) into the muscle once as needed (Throat or facial swelling or difficulty breathing).   PREDNISONE (DELTASONE) 20 MG TABLET    3 tabs po day one, then 2 po daily x 4 days     Julianne Rice, MD 01/11/16 1735

## 2016-01-11 NOTE — Telephone Encounter (Signed)
[facility] - Client [facility] Patient Name: [name] DOB: 01/21/1956 Initial Comment Caller states c/o tightness in chest, shortness of breath and fatigue. Nurse Assessment Nurse: Markus Daft, RN, Sherre Poot Date/Time (Eastern Time): 01/11/2016 2:29:50 PM Confirm and document reason for call. If symptomatic, describe symptoms. You must click the next button to save text entered. ---Caller states c/o tightness in chest (started getting worse at lunch time), cough, shortness of breath and fatigue. Used 30 min. ago. Diagnosed with bronchitis last Tuesday. Unable to get her antibiotic filled on Friday. Has the patient traveled out of the country within the last 30 days? ---Not Applicable Does the patient have any new or worsening symptoms? ---Yes Will a triage be completed? ---Yes Related visit to physician within the last 2 weeks? ---Yes Does the PT have any chronic conditions? (i.e. diabetes, asthma, etc.) ---Yes List chronic conditions. ---seasonal asthma when she has bronchitis Is this a behavioral health or substance abuse call? ---No Guidelines Guideline Title Affirmed Question Affirmed Notes Cough - Acute Productive Severe difficulty breathing (e.g., struggling for each breath, speaks in single words) Asthma Attack Severe difficulty breathing (e.g., struggling for each breath, speak in single words, pulse > 120) Final Disposition User Call EMS 911 Now Markus Daft, [place], [place] Disagree/Comply: Comply Disagree/Comply: Comply PT then decided to have a co worker take her to ED instead of calling 911.

## 2016-01-26 ENCOUNTER — Ambulatory Visit: Payer: Self-pay | Admitting: Family Medicine

## 2016-01-28 ENCOUNTER — Ambulatory Visit (INDEPENDENT_AMBULATORY_CARE_PROVIDER_SITE_OTHER): Payer: BLUE CROSS/BLUE SHIELD | Admitting: Family Medicine

## 2016-01-28 ENCOUNTER — Encounter: Payer: Self-pay | Admitting: Family Medicine

## 2016-01-28 VITALS — BP 112/64 | HR 102 | Temp 98.9°F | Ht 67.0 in | Wt 298.5 lb

## 2016-01-28 DIAGNOSIS — R002 Palpitations: Secondary | ICD-10-CM

## 2016-01-28 DIAGNOSIS — E559 Vitamin D deficiency, unspecified: Secondary | ICD-10-CM

## 2016-01-28 DIAGNOSIS — Z23 Encounter for immunization: Secondary | ICD-10-CM | POA: Diagnosis not present

## 2016-01-28 DIAGNOSIS — I10 Essential (primary) hypertension: Secondary | ICD-10-CM | POA: Diagnosis not present

## 2016-01-28 DIAGNOSIS — E669 Obesity, unspecified: Secondary | ICD-10-CM

## 2016-01-28 DIAGNOSIS — M069 Rheumatoid arthritis, unspecified: Secondary | ICD-10-CM

## 2016-01-28 DIAGNOSIS — E1140 Type 2 diabetes mellitus with diabetic neuropathy, unspecified: Secondary | ICD-10-CM

## 2016-01-28 DIAGNOSIS — J45901 Unspecified asthma with (acute) exacerbation: Secondary | ICD-10-CM | POA: Diagnosis not present

## 2016-01-28 DIAGNOSIS — H9202 Otalgia, left ear: Secondary | ICD-10-CM

## 2016-01-28 LAB — VITAMIN D 25 HYDROXY (VIT D DEFICIENCY, FRACTURES): VITD: 46.73 ng/mL (ref 30.00–100.00)

## 2016-01-28 LAB — RHEUMATOID FACTOR: Rhuematoid fact SerPl-aCnc: 11 IU/mL (ref <=14)

## 2016-01-28 MED ORDER — HYDROCORTISONE-ACETIC ACID 1-2 % OT SOLN: 4.0000 [drp] | Freq: Three times a day (TID) | OTIC | 0 refills | Status: DC

## 2016-01-28 MED ORDER — OFLOXACIN 0.3 % OT SOLN: 5.0000 [drp] | Freq: Two times a day (BID) | OTIC | 0 refills | Status: DC

## 2016-01-28 NOTE — Assessment & Plan Note (Signed)
Well controlled, no changes to meds. Encouraged heart healthy diet such as the DASH diet and exercise as tolerated.  °

## 2016-01-28 NOTE — Assessment & Plan Note (Signed)
History of diagnosis of RA is questionable. Not highly symptomatic at this time. Check a Rheumatoid Factor today

## 2016-01-28 NOTE — Assessment & Plan Note (Signed)
No concerns at present

## 2016-01-28 NOTE — Assessment & Plan Note (Signed)
Dr Evorn Gong is set to do the gastric sleeve in January of 2018 if no further complications to her health status occurs

## 2016-01-28 NOTE — Patient Instructions (Signed)
Hypertension Hypertension, commonly called high blood pressure, is when the force of blood pumping through your arteries is too strong. Your arteries are the blood vessels that carry blood from your heart throughout your body. A blood pressure reading consists of a higher number over a lower number, such as 110/72. The higher number (systolic) is the pressure inside your arteries when your heart pumps. The lower number (diastolic) is the pressure inside your arteries when your heart relaxes. Ideally you want your blood pressure below 120/80. Hypertension forces your heart to work harder to pump blood. Your arteries may become narrow or stiff. Having untreated or uncontrolled hypertension can cause heart attack, stroke, kidney disease, and other problems. RISK FACTORS Some risk factors for high blood pressure are controllable. Others are not.  Risk factors you cannot control include:   Race. You may be at higher risk if you are African American.  Age. Risk increases with age.  Gender. Men are at higher risk than women before age 45 years. After age 65, women are at higher risk than men. Risk factors you can control include:  Not getting enough exercise or physical activity.  Being overweight.  Getting too much fat, sugar, calories, or salt in your diet.  Drinking too much alcohol. SIGNS AND SYMPTOMS Hypertension does not usually cause signs or symptoms. Extremely high blood pressure (hypertensive crisis) may cause headache, anxiety, shortness of breath, and nosebleed. DIAGNOSIS To check if you have hypertension, your health care provider will measure your blood pressure while you are seated, with your arm held at the level of your heart. It should be measured at least twice using the same arm. Certain conditions can cause a difference in blood pressure between your right and left arms. A blood pressure reading that is higher than normal on one occasion does not mean that you need treatment. If  it is not clear whether you have high blood pressure, you may be asked to return on a different day to have your blood pressure checked again. Or, you may be asked to monitor your blood pressure at home for 1 or more weeks. TREATMENT Treating high blood pressure includes making lifestyle changes and possibly taking medicine. Living a healthy lifestyle can help lower high blood pressure. You may need to change some of your habits. Lifestyle changes may include:  Following the DASH diet. This diet is high in fruits, vegetables, and whole grains. It is low in salt, red meat, and added sugars.  Keep your sodium intake below 2,300 mg per day.  Getting at least 30-45 minutes of aerobic exercise at least 4 times per week.  Losing weight if necessary.  Not smoking.  Limiting alcoholic beverages.  Learning ways to reduce stress. Your health care provider may prescribe medicine if lifestyle changes are not enough to get your blood pressure under control, and if one of the following is true:  You are 18-59 years of age and your systolic blood pressure is above 140.  You are 60 years of age or older, and your systolic blood pressure is above 150.  Your diastolic blood pressure is above 90.  You have diabetes, and your systolic blood pressure is over 140 or your diastolic blood pressure is over 90.  You have kidney disease and your blood pressure is above 140/90.  You have heart disease and your blood pressure is above 140/90. Your personal target blood pressure may vary depending on your medical conditions, your age, and other factors. HOME CARE INSTRUCTIONS    Have your blood pressure rechecked as directed by your health care provider.   Take medicines only as directed by your health care provider. Follow the directions carefully. Blood pressure medicines must be taken as prescribed. The medicine does not work as well when you skip doses. Skipping doses also puts you at risk for  problems.  Do not smoke.   Monitor your blood pressure at home as directed by your health care provider. SEEK MEDICAL CARE IF:   You think you are having a reaction to medicines taken.  You have recurrent headaches or feel dizzy.  You have swelling in your ankles.  You have trouble with your vision. SEEK IMMEDIATE MEDICAL CARE IF:  You develop a severe headache or confusion.  You have unusual weakness, numbness, or feel faint.  You have severe chest or abdominal pain.  You vomit repeatedly.  You have trouble breathing. MAKE SURE YOU:   Understand these instructions.  Will watch your condition.  Will get help right away if you are not doing well or get worse.   This information is not intended to replace advice given to you by your health care provider. Make sure you discuss any questions you have with your health care provider.   Document Released: 05/15/2005 Document Revised: 09/29/2014 Document Reviewed: 03/07/2013 Elsevier Interactive Patient Education 2016 [employer]  

## 2016-01-28 NOTE — Assessment & Plan Note (Signed)
OE and SOM, did not respond to cortisporin Otic, given 2 paper prescriptions to see if her insurance will cover. Floxin otic or Vosol can alternately use H2O2 qhs and may need to return to ENT if psymptoms persist

## 2016-01-28 NOTE — Progress Notes (Signed)
Pre visit review using our clinic review tool, if applicable. No additional management support is needed unless otherwise documented below in the visit note. 

## 2016-01-28 NOTE — Progress Notes (Signed)
Patient ID: [name], female   DOB: 1955-07-19, 60 y.o.   MRN: [id number]   Subjective:    Patient ID: [name], female    DOB: 01/27/1956, 60 y.o.   MRN: [id number]  Chief Complaint  Patient presents with  . Follow-up    HPI Patient is in today for follow up. Struggled with a bad asthma exacerbation this past month then a subsequent allergic reaction to Mucinex which landed her in ER, after steroids and benadryl she has felt better. No more myalgias or malaise. Still some mild congestion but no fevers or chills. Continues to struggle with left ear pain and itching no discharge or tinnitus. No poyuria or polydipsia. Is not returning to rheumatology at his time secondary to no joint pain of any note. Denies CP/palp/SOB/HA/fevers/GI or GU c/o. Taking meds as prescribed  Past Medical History:  Diagnosis Date  . Allergic rhinitis   . Anxiety   . Breast cancer ([place])   . Colon polyps   . Depression   . Diabetes mellitus    borderline but takes metformin  . Edema   . Family history of breast cancer in first degree relative   . GERD (gastroesophageal reflux disease)   . Hyperparathyroidism   . Hypertension    controlled by medications  . Obesity   . Osteoarthritis    RA  . PONV (postoperative nausea and vomiting)    occasional  . Sleep apnea   . Viral meningitis   . Vitamin D deficiency     Past Surgical History:  Procedure Laterality Date  . ABDOMINAL HYSTERECTOMY  1998  . ADENOIDECTOMY    . BREAST SURGERY     Tran flap due to breast cancer  . CESAREAN SECTION  1988  . HAND SURGERY     dog bite, right hand  . HAND SURGERY     trauma, left hand  . KNEE ARTHROSCOPY     bilateral  . left knee replacement  2010  . MASTECTOMY  01/13/94   Right breast  . parathyroid resection    . PARATHYROIDECTOMY    . rectal abscess    . SEPTOPLASTY  1980  . SHOULDER ARTHROSCOPY Right 11/09/2015   Procedure: ARTHROSCOPY SHOULDER-acromioplasty, distal clavicle resection and  debridement;  Surgeon: Melrose Nakayama, MD;  Location: [employer];  Service: Orthopedics;  Laterality: Right;  . TOE SURGERY Right    paronychia and adenoma removed  . TONSILLECTOMY    . TOTAL KNEE ARTHROPLASTY  06/18/08   [facility]  . TUMOR REMOVAL  1982   , scalp    Family History  Problem Relation Age of Onset  . Emphysema Father   . Lung cancer Father   . Breast cancer Maternal Aunt   . Colon cancer Maternal Aunt   . Breast cancer Paternal Grandfather   . Heart disease Paternal Grandfather   . Prostate cancer Paternal Grandfather   . Breast cancer    . Ovarian cancer    . Colon polyps    . Asthma Daughter     "seasonal"  . Arthritis Maternal Grandmother   . Arthritis Mother   . Multiple sclerosis Sister     Social History   Social History  . Marital status: Widowed    Spouse name: N/A  . Number of children: 1  . Years of education: N/A   Occupational History  . [place] History Main Topics  . Smoking status: Never Smoker  . Smokeless tobacco:  Never Used  . Alcohol use Yes     Comment: occ.  . Drug use: No  . Sexual activity: Not on file   Other Topics Concern  . Not on file   Social History Narrative  . No narrative on file    Outpatient Medications Prior to Visit  Medication Sig Dispense Refill  . albuterol (VENTOLIN HFA) 108 (90 Base) MCG/ACT inhaler Inhale 2 puffs into the lungs every 6 (six) hours as needed for wheezing or shortness of breath. 18 g 1  . Calcium Carbonate (CALTRATE 600) 1500 MG TABS Take 1,500 mg by mouth daily.     . cetirizine (ZYRTEC) 10 MG tablet Take 1 tablet (10 mg total) by mouth 2 (two) times daily. 60 tablet 5  . Cholecalciferol (VITAMIN D PO) Take 1 tablet by mouth daily.    . Cinnamon 500 MG capsule Take 1,000 mg by mouth daily.    . clotrimazole (LOTRIMIN) 1 % external solution 4 drops in left ear 4 times daily 7 days 30 mL 0  . diphenhydrAMINE (BENADRYL) 25 MG tablet Take 1 tablet (25 mg total) by  mouth every 8 (eight) hours as needed for itching, allergies or sleep. 30 tablet 0  . EPINEPHrine 0.3 mg/0.3 mL IJ SOAJ injection Inject 0.3 mLs (0.3 mg total) into the muscle once as needed (Throat or facial swelling or difficulty breathing). 1 Device 0  . esomeprazole (NEXIUM) 40 MG capsule Take 1 capsule (40 mg total) by mouth daily as needed. TAKE 1 CAPSULE (40 MG TOTAL) BY MOUTH DAILY. 90 capsule 1  . ferrous sulfate 325 (65 FE) MG EC tablet Take 325 mg by mouth daily.    . fluticasone (FLONASE) 50 MCG/ACT nasal spray Place 2 sprays into both nostrils daily. (Patient taking differently: Place 2 sprays into both nostrils daily as needed for allergies. ) 16 g 1  . furosemide (LASIX) 40 MG tablet TAKE 1 TABLET BY MOUTH 2 TIMES DAILY (Patient taking differently: TAKE 1 TABLET BY MOUTH DAILY) 60 tablet 3  . Ginger 500 MG CAPS Take 500 mg by mouth daily.     . Melatonin 5 MG TABS Take 5 mg by mouth at bedtime as needed (For sleep.).    Marland Kitchen metFORMIN (GLUCOPHAGE) 500 MG tablet TAKE 1 TABLET (500 MG TOTAL) BY MOUTH 2 (TWO) TIMES DAILY WITH A MEAL. (Patient taking differently: TAKE 1 TABLET (500 MG TOTAL) BY MOUTH DAILY WITH A MEAL.) 180 tablet 1  . Multiple Vitamins-Minerals (MULTIVITAMIN & MINERAL PO) Take 1 tablet by mouth daily.    . ranitidine (ZANTAC) 150 MG tablet Take 1 tablet (150 mg total) by mouth 2 (two) times daily. 60 tablet 5  . telmisartan-hydrochlorothiazide (MICARDIS HCT) 80-25 MG tablet Take 1 tablet by mouth daily. 90 tablet 1  . Turmeric 500 MG CAPS Take 500 mg by mouth daily.     Marland Kitchen neomycin-polymyxin-hydrocortisone (CORTISPORIN) otic solution Place 3 drops into the left ear 4 (four) times daily. 10 mL 0  . predniSONE (DELTASONE) 20 MG tablet 3 tabs po day one, then 2 po daily x 4 days 11 tablet 0   No facility-administered medications prior to visit.     Allergies  Allergen Reactions  . Mucinex [Guaifenesin Er] Shortness Of Breath  . Penicillins Hives and Other (See Comments)     Has patient had a PCN reaction causing immediate rash, facial/tongue/throat swelling, SOB or lightheadedness with hypotension: no Has patient had a PCN reaction causing severe rash involving mucus membranes or  skin necrosis: no Has patient had a PCN reaction that required hospitalization no Has patient had a PCN reaction occurring within the last 10 years: no If all of the above answers are "NO", then may proceed with Cephalosporin use.     Review of Systems  Constitutional: Positive for malaise/fatigue. Negative for fever.  HENT: Positive for congestion and ear pain. Negative for ear discharge, hearing loss and tinnitus.   Eyes: Negative for blurred vision.  Respiratory: Positive for cough. Negative for sputum production and shortness of breath.   Cardiovascular: Negative for chest pain, palpitations and leg swelling.  Gastrointestinal: Negative for abdominal pain, blood in stool and nausea.  Genitourinary: Negative for dysuria and frequency.  Musculoskeletal: Negative for falls.  Skin: Negative for rash.  Neurological: Negative for dizziness, loss of consciousness and headaches.  Endo/Heme/Allergies: Negative for environmental allergies.  Psychiatric/Behavioral: Negative for depression. The patient is not nervous/anxious.        Objective:    Physical Exam  Constitutional: She is oriented to person, place, and time. She appears well-developed and well-nourished. No distress.  HENT:  Head: Normocephalic and atraumatic.  Nose: Nose normal.  Left external canal mildly edematous and flaky wax noted. TM dull and edematous. Right ear clear.  Eyes: Right eye exhibits no discharge. Left eye exhibits no discharge.  Neck: Normal range of motion. Neck supple.  Cardiovascular: Normal rate and regular rhythm.   No murmur heard. Pulmonary/Chest: Effort normal and breath sounds normal.  Abdominal: Soft. Bowel sounds are normal. There is no tenderness.  Musculoskeletal: She exhibits no  edema.  Neurological: She is alert and oriented to person, place, and time.  Skin: Skin is warm and dry.  Psychiatric: She has a normal mood and affect.  Nursing note and vitals reviewed.   BP 112/64 (BP Location: Left Arm, Patient Position: Sitting, Cuff Size: Large)   Pulse (!) 102   Temp 98.9 F (37.2 C) (Oral)   Ht 5\' 7"  (1.702 m)   Wt 298 lb 8 oz (135.4 kg)   SpO2 96%   BMI 46.75 kg/m  Wt Readings from Last 3 Encounters:  01/28/16 298 lb 8 oz (135.4 kg)  01/04/16 292 lb 4 oz (132.6 kg)  12/28/15 293 lb 9.6 oz (133.2 kg)     Lab Results  Component Value Date   WBC 10.5 11/18/2015   HGB 12.4 11/18/2015   HCT 38.1 11/18/2015   PLT 300.0 11/18/2015   GLUCOSE 114 (H) 11/18/2015   CHOL 182 01/21/2015   TRIG 188.0 (H) 01/21/2015   HDL 44.80 01/21/2015   LDLDIRECT 96.5 04/10/2013   LDLCALC 99 01/21/2015   ALT 79 (H) 11/26/2015   AST 44 (H) 11/26/2015   NA 134 (L) 11/18/2015   K 3.7 11/18/2015   CL 95 (L) 11/18/2015   CREATININE 1.18 11/18/2015   BUN 27 (H) 11/18/2015   CO2 27 11/18/2015   TSH 0.78 01/21/2015   INR 2.0 (H) 06/21/2008   HGBA1C 6.1 11/18/2015    Lab Results  Component Value Date   TSH 0.78 01/21/2015   Lab Results  Component Value Date   WBC 10.5 11/18/2015   HGB 12.4 11/18/2015   HCT 38.1 11/18/2015   MCV 84.8 11/18/2015   PLT 300.0 11/18/2015   Lab Results  Component Value Date   NA 134 (L) 11/18/2015   K 3.7 11/18/2015   CO2 27 11/18/2015   GLUCOSE 114 (H) 11/18/2015   BUN 27 (H) 11/18/2015   CREATININE 1.18 11/18/2015  BILITOT 0.4 11/26/2015   ALKPHOS 156 (H) 11/26/2015   AST 44 (H) 11/26/2015   ALT 79 (H) 11/26/2015   PROT 6.6 11/26/2015   ALBUMIN 3.9 11/26/2015   CALCIUM 9.9 11/18/2015   ANIONGAP 11 11/09/2015   GFR 49.44 (L) 11/18/2015   Lab Results  Component Value Date   CHOL 182 01/21/2015   Lab Results  Component Value Date   HDL 44.80 01/21/2015   Lab Results  Component Value Date   LDLCALC 99 01/21/2015     Lab Results  Component Value Date   TRIG 188.0 (H) 01/21/2015   Lab Results  Component Value Date   CHOLHDL 4 01/21/2015   Lab Results  Component Value Date   HGBA1C 6.1 11/18/2015       Assessment & Plan:   Problem List Items Addressed This Visit    Vitamin D deficiency    Check level today. Takes 2 calcium/vitamin D tabs daily      Relevant Orders   Vitamin D (25 hydroxy)   Essential hypertension    Well controlled, no changes to meds. Encouraged heart healthy diet such as the DASH diet and exercise as tolerated.       Palpitations    No concerns at present      Obesity    Dr Evorn Gong is set to do the gastric sleeve in January of 2018 if no further complications to her health status occurs      Diabetes mellitus, type 2 (HCC)    hgba1c acceptable, minimize simple carbs. Increase exercise as tolerated. Continue current meds      Left ear pain    OE and SOM, did not respond to cortisporin Otic, given 2 paper prescriptions to see if her insurance will cover. Floxin otic or Vosol can alternately use H2O2 qhs and may need to return to ENT if psymptoms persist      Asthma with acute exacerbation    Recovering after treatment of exacerbation last month and reaction to Mucinex. Hydrate well, rest and report any worsening of symptoms      Rheumatoid arthritis ([place])    History of diagnosis of RA is questionable. Not highly symptomatic at this time. Check a Rheumatoid Factor today      Relevant Orders   Rheumatoid Factor    Other Visit Diagnoses   None.     I have discontinued Ms. Boven's neomycin-polymyxin-hydrocortisone and predniSONE. I am also having her start on acetic acid-hydrocortisone and ofloxacin. Additionally, I am having her maintain her calcium carbonate, ferrous sulfate, furosemide, metFORMIN, Cinnamon, Multiple Vitamins-Minerals (MULTIVITAMIN & MINERAL PO), Ginger, Turmeric, fluticasone, telmisartan-hydrochlorothiazide, Melatonin, Cholecalciferol  (VITAMIN D PO), cetirizine, esomeprazole, ranitidine, albuterol, diphenhydrAMINE, clotrimazole, and EPINEPHrine.  Meds ordered this encounter  Medications  . acetic acid-hydrocortisone (VOSOL-HC) otic solution    Sig: Place 4 drops into the left ear 3 (three) times daily.    Dispense:  10 mL    Refill:  0  . ofloxacin (FLOXIN OTIC) 0.3 % otic solution    Sig: Place 5 drops into the left ear 2 (two) times daily.    Dispense:  5 mL    Refill:  0     Penni Homans, MD

## 2016-01-28 NOTE — Assessment & Plan Note (Signed)
hgba1c acceptable, minimize simple carbs. Increase exercise as tolerated. Continue current meds 

## 2016-01-28 NOTE — Assessment & Plan Note (Addendum)
Recovering after treatment of exacerbation last month and reaction to Mucinex. Hydrate well, rest and report any worsening of symptoms

## 2016-01-28 NOTE — Assessment & Plan Note (Signed)
Check level today. Takes 2 calcium/vitamin D tabs daily

## 2016-01-28 NOTE — Addendum Note (Signed)
Addended by: Sharon Seller B on: 01/28/2016 09:03 AM   Modules accepted: Orders

## 2016-02-03 ENCOUNTER — Encounter: Payer: Self-pay | Admitting: Family Medicine

## 2016-02-03 ENCOUNTER — Other Ambulatory Visit: Payer: Self-pay | Admitting: Family Medicine

## 2016-02-03 MED ORDER — FUROSEMIDE 40 MG PO TABS: 40.0000 mg | ORAL_TABLET | Freq: Two times a day (BID) | ORAL | 3 refills | Status: DC

## 2016-02-16 ENCOUNTER — Other Ambulatory Visit: Payer: Self-pay | Admitting: Family Medicine

## 2016-02-22 ENCOUNTER — Telehealth: Payer: Self-pay | Admitting: Genetic Counselor

## 2016-02-22 NOTE — Telephone Encounter (Signed)
Ms. Scarpino's sister Santiago Glad recently had genetic counseling and testing and she let Ms. Patzer know that she should have genetic testing.  Ms. Lips called me to find out more about this.  We discussed that this recommendation was made due to her young age at breast cancer diagnosis.  We discussed that she is eligible for insurance coverage of genetic testing.  She is interested in making a genetic counseling appt, so I gave her [facility] Form number to do that.

## 2016-03-19 ENCOUNTER — Emergency Department (HOSPITAL_BASED_OUTPATIENT_CLINIC_OR_DEPARTMENT_OTHER)
Admission: EM | Admit: 2016-03-19 | Discharge: 2016-03-19 | Disposition: A | Discharge status: Home / Self Care | Payer: BLUE CROSS/BLUE SHIELD | Attending: Emergency Medicine | Admitting: Emergency Medicine

## 2016-03-19 ENCOUNTER — Encounter (HOSPITAL_BASED_OUTPATIENT_CLINIC_OR_DEPARTMENT_OTHER): Payer: Self-pay | Admitting: Emergency Medicine

## 2016-03-19 DIAGNOSIS — I10 Essential (primary) hypertension: Secondary | ICD-10-CM | POA: Insufficient documentation

## 2016-03-19 DIAGNOSIS — R21 Rash and other nonspecific skin eruption: Secondary | ICD-10-CM | POA: Diagnosis not present

## 2016-03-19 DIAGNOSIS — L509 Urticaria, unspecified: Secondary | ICD-10-CM | POA: Diagnosis present

## 2016-03-19 DIAGNOSIS — Z853 Personal history of malignant neoplasm of breast: Secondary | ICD-10-CM | POA: Insufficient documentation

## 2016-03-19 DIAGNOSIS — Z79899 Other long term (current) drug therapy: Secondary | ICD-10-CM | POA: Insufficient documentation

## 2016-03-19 DIAGNOSIS — Z7984 Long term (current) use of oral hypoglycemic drugs: Secondary | ICD-10-CM | POA: Insufficient documentation

## 2016-03-19 DIAGNOSIS — E119 Type 2 diabetes mellitus without complications: Secondary | ICD-10-CM | POA: Insufficient documentation

## 2016-03-19 DIAGNOSIS — J45901 Unspecified asthma with (acute) exacerbation: Secondary | ICD-10-CM | POA: Insufficient documentation

## 2016-03-19 MED ORDER — PREDNISONE 20 MG PO TABS
20.0000 mg | ORAL_TABLET | Freq: Once | ORAL | Status: AC
  Administered 2016-03-19: 20 mg via ORAL
  Filled 2016-03-19: qty 1

## 2016-03-19 MED ORDER — PREDNISONE 20 MG PO TABS: ORAL_TABLET | ORAL | 0 refills | Status: DC

## 2016-03-19 MED ORDER — FAMOTIDINE 20 MG PO TABS
40.0000 mg | ORAL_TABLET | Freq: Once | ORAL | Status: AC
  Administered 2016-03-19: 40 mg via ORAL
  Filled 2016-03-19: qty 2

## 2016-03-19 NOTE — Discharge Instructions (Signed)
Please read and follow all provided instructions.  Your diagnoses today include:  1. Urticarial rash    Tests performed today include: Vital signs. See below for your results today.   Medications prescribed:  Take as prescribed   Home care instructions:  Follow any educational materials contained in this packet.  Follow-up instructions: Please follow-up with an allergist for further evaluation of symptoms and treatment   Return instructions:  Please return to the Emergency Department if you do not get better, if you get worse, or new symptoms OR  - Fever (temperature greater than 101.81F)  - Bleeding that does not stop with holding pressure to the area    -Severe pain (please note that you may be more sore the day after your accident)  - Chest Pain  - Difficulty breathing  - Severe nausea or vomiting  - Inability to tolerate food and liquids  - Passing out  - Skin becoming red around your wounds  - Change in mental status (confusion or lethargy)  - New numbness or weakness    Please return if you have any other emergent concerns.  Additional Information:  Your vital signs today were: BP 124/82    Pulse 110    Temp 98.7 F (37.1 C)    Resp 20    Ht 5\' 6"  (1.676 m)    Wt 134.7 kg    SpO2 97%    BMI 47.94 kg/m  If your blood pressure (BP) was elevated above 135/85 this visit, please have this repeated by your doctor within one month. ---------------

## 2016-03-19 NOTE — ED Provider Notes (Signed)
[facility] DEPT [facility] Provider Note   CSN: [id number] Arrival date & time: 03/19/16  2007  By signing my name below, I, [name], attest that this documentation has been prepared under the direction and in the presence of Shary Decamp, PA-C Electronically Signed: Soijett [name], ED Scribe. 03/19/16. 8:39 PM.   History   Chief Complaint Chief Complaint  Patient presents with  . Urticaria    HPI [name] is a 60 y.o. female with a PMHx of HTN, DM, who presents to the Emergency Department complaining of pruritic generalized rash onset yesterday morning worsening today. Pt denies new soaps, medications, pets, environment, lotion, or detergent. Pt is having associated symptoms of color change to affected areas. She notes that she has tried benadryl and Rx 20 mg prednisone with mild relief of her symptoms. She denies wound, SOB, throat closing sensation, nausea, vomiting, fever, and any other symptoms.   The history is provided by the patient. No language interpreter was used.    Past Medical History:  Diagnosis Date  . Allergic rhinitis   . Anxiety   . Breast cancer ([place])   . Colon polyps   . Depression   . Diabetes mellitus    borderline but takes metformin  . Edema   . Family history of breast cancer in first degree relative   . GERD (gastroesophageal reflux disease)   . Hyperparathyroidism   . Hypertension    controlled by medications  . Obesity   . Osteoarthritis    RA  . PONV (postoperative nausea and vomiting)    occasional  . Sleep apnea   . Viral meningitis   . Vitamin D deficiency     Patient Active Problem List   Diagnosis Date Noted  . Rheumatoid arthritis ([place]) 06/24/2015  . Diverticulitis of colon without hemorrhage 09/21/2014  . Asthma with acute exacerbation 07/14/2014  . Left ear pain 07/06/2014  . Cough 07/06/2014  . Sinusitis 06/25/2014  . Jaw pain 05/12/2014  . Lumbar radiculopathy, acute 08/21/2013  . Gout of big toe 03/10/2013  .  Routine general medical examination at a health care facility 10/08/2012  . OSA (obstructive sleep apnea) 04/23/2012  . Croup 04/02/2012  . Bronchitis, acute 02/08/2012  . Diabetes mellitus, type 2 ([place]) 12/10/2011  . Shortness of breath 12/06/2011  . Personal history of colonic polyps - adenoma 08/14/2011  . Fatigue 08/03/2011  . Ear canal dryness 08/03/2011  . Conjunctivitis 03/27/2011  . Neck pain 01/16/2011  . Palpitations 09/21/2010  . Obesity 09/21/2010  . PAC (premature atrial contraction) 09/21/2010  . Leg pain, posterior 08/25/2010  . COUGH VARIANT ASTHMA 06/23/2010  . AIRWAY OBSTRUCTION 06/20/2010  . ATTENTION DEFICIT DISORDER, INATTENTIVE TYPE 09/22/2009  . VIRAL MENINGITIS, HX OF 04/29/2009  . INSOMNIA-SLEEP DISORDER-UNSPEC 02/15/2009  . Vitamin D deficiency 03/11/2008  . Fibromyalgia 03/05/2008  . ANXIETY STATE, UNSPECIFIED 08/06/2007  . HYPERPARATHYROIDISM, HX OF 08/06/2007  . ALLERGIC RHINITIS 07/05/2007  . OSTEOARTHRITIS 02/28/2007  . Edema 11/01/2006  . DEPRESSION 06/25/2006  . Essential hypertension 06/25/2006  . Esophageal reflux 06/25/2006    Past Surgical History:  Procedure Laterality Date  . ABDOMINAL HYSTERECTOMY  1998  . ADENOIDECTOMY    . BREAST SURGERY     Tran flap due to breast cancer  . CESAREAN SECTION  1988  . HAND SURGERY     dog bite, right hand  . HAND SURGERY     trauma, left hand  . KNEE ARTHROSCOPY     bilateral  .  left knee replacement  2010  . MASTECTOMY  01/13/94   Right breast  . parathyroid resection    . PARATHYROIDECTOMY    . rectal abscess    . SEPTOPLASTY  1980  . SHOULDER ARTHROSCOPY Right 11/09/2015   Procedure: ARTHROSCOPY SHOULDER-acromioplasty, distal clavicle resection and debridement;  Surgeon: Melrose Nakayama, MD;  Location: [facility];  Service: Orthopedics;  Laterality: Right;  . TOE SURGERY Right    paronychia and adenoma removed  . TONSILLECTOMY    . TOTAL KNEE ARTHROPLASTY  06/18/08   [facility]  . TUMOR  REMOVAL  1982   , scalp    OB History    Gravida Para Term Preterm AB Living   1 1           SAB TAB Ectopic Multiple Live Births                   Home Medications    Prior to Admission medications   Medication Sig Start Date End Date Taking? Authorizing Provider  acetic acid-hydrocortisone (VOSOL-HC) otic solution Place 4 drops into the left ear 3 (three) times daily. 01/28/16   Mosie Lukes, MD  albuterol (VENTOLIN HFA) 108 (90 Base) MCG/ACT inhaler Inhale 2 puffs into the lungs every 6 (six) hours as needed for wheezing or shortness of breath. 01/04/16   Colon Branch, MD  Calcium Carbonate (CALTRATE 600) 1500 MG TABS Take 1,500 mg by mouth daily.     Historical Provider, MD  cetirizine (ZYRTEC) 10 MG tablet Take 1 tablet (10 mg total) by mouth 2 (two) times daily. 12/28/15   Mosie Lukes, MD  Cholecalciferol (VITAMIN D PO) Take 1 tablet by mouth daily.    Historical Provider, MD  Cinnamon 500 MG capsule Take 1,000 mg by mouth daily.    Historical Provider, MD  clotrimazole (LOTRIMIN) 1 % external solution 4 drops in left ear 4 times daily 7 days 01/11/16   Julianne Rice, MD  diphenhydrAMINE (BENADRYL) 25 MG tablet Take 1 tablet (25 mg total) by mouth every 8 (eight) hours as needed for itching, allergies or sleep. 01/11/16   Julianne Rice, MD  EPINEPHrine 0.3 mg/0.3 mL IJ SOAJ injection Inject 0.3 mLs (0.3 mg total) into the muscle once as needed (Throat or facial swelling or difficulty breathing). 01/11/16   Julianne Rice, MD  esomeprazole (NEXIUM) 40 MG capsule Take 1 capsule (40 mg total) by mouth daily as needed. TAKE 1 CAPSULE (40 MG TOTAL) BY MOUTH DAILY. 12/28/15   Mosie Lukes, MD  ferrous sulfate 325 (65 FE) MG EC tablet Take 325 mg by mouth daily.    Historical Provider, MD  fluticasone (FLONASE) 50 MCG/ACT nasal spray Place 2 sprays into both nostrils daily. Patient taking differently: Place 2 sprays into both nostrils daily as needed for allergies.  06/29/15   Percell Miller  Sagu[place], PA-C  furosemide (LASIX) 40 MG tablet Take 1 tablet (40 mg total) by mouth 2 (two) times daily. 02/03/16   Mosie Lukes, MD  Ginger 500 MG CAPS Take 500 mg by mouth daily.     Historical Provider, MD  Melatonin 5 MG TABS Take 5 mg by mouth at bedtime as needed (For sleep.).    Historical Provider, MD  metFORMIN (GLUCOPHAGE) 500 MG tablet TAKE 1 TABLET (500 MG TOTAL) BY MOUTH 2 (TWO) TIMES DAILY WITH A MEAL. Patient taking differently: TAKE 1 TABLET (500 MG TOTAL) BY MOUTH DAILY WITH A MEAL. 03/31/15   Midge Minium, MD  Multiple Vitamins-Minerals (MULTIVITAMIN & MINERAL PO) Take 1 tablet by mouth daily.    Historical Provider, MD  ofloxacin (FLOXIN OTIC) 0.3 % otic solution Place 5 drops into the left ear 2 (two) times daily. 01/28/16   Mosie Lukes, MD  ranitidine (ZANTAC) 150 MG tablet Take 1 tablet (150 mg total) by mouth 2 (two) times daily. 12/28/15   Mosie Lukes, MD  telmisartan-hydrochlorothiazide (MICARDIS HCT) 80-25 MG tablet TAKE ONE TABLET BY MOUTH ONCE DAILY 02/17/16   Mosie Lukes, MD  Turmeric 500 MG CAPS Take 500 mg by mouth daily.     Historical Provider, MD    Family History Family History  Problem Relation Age of Onset  . Emphysema Father   . Lung cancer Father   . Breast cancer Maternal Aunt   . Colon cancer Maternal Aunt   . Breast cancer Paternal Grandfather   . Heart disease Paternal Grandfather   . Prostate cancer Paternal Grandfather   . Breast cancer    . Ovarian cancer    . Colon polyps    . Asthma Daughter     "seasonal"  . Arthritis Maternal Grandmother   . Arthritis Mother   . Multiple sclerosis Sister     Social History Social History  Substance Use Topics  . Smoking status: Never Smoker  . Smokeless tobacco: Never Used  . Alcohol use Yes     Comment: occ.     Allergies   Mucinex [guaifenesin er] and Penicillins   Review of Systems Review of Systems A complete 10 system review of systems was obtained and all systems are  negative except as noted in the HPI and PMH.   Physical Exam Updated Vital Signs BP 124/82   Pulse 110   Temp 98.7 F (37.1 C)   Resp 20   Ht 5\' 6"  (1.676 m)   Wt 297 lb (134.7 kg)   SpO2 97%   BMI 47.94 kg/m   Physical Exam  Constitutional: She is oriented to person, place, and time. She appears well-developed and well-nourished. No distress.  Patent airway. Phonating well.   HENT:  Head: Normocephalic and atraumatic.  Eyes: EOM are normal.  Neck: Neck supple.  Cardiovascular: Normal rate.   Pulmonary/Chest: Effort normal. No respiratory distress.  Abdominal: She exhibits no distension.  Musculoskeletal: Normal range of motion.  Neurological: She is alert and oriented to person, place, and time.  Skin: Skin is warm and dry. Rash noted. Rash is urticarial.  Diffuse urticarial rash to BUE, upper back, and torso.  Psychiatric: She has a normal mood and affect. Her behavior is normal.  Nursing note and vitals reviewed.  ED Treatments / Results  DIAGNOSTIC STUDIES: Oxygen Saturation is 97% on RA, nl by my interpretation.    COORDINATION OF CARE: 8:36 PM Discussed treatment plan with pt at bedside which includes referral to allergist, prednisone Rx, and pt agreed to plan.  Procedures Procedures (including critical care time)  Medications Ordered in ED Medications  predniSONE (DELTASONE) tablet 20 mg (20 mg Oral Given 03/19/16 2044)  famotidine (PEPCID) tablet 40 mg (40 mg Oral Given 03/19/16 2044)  predniSONE (DELTASONE) tablet 20 mg (20 mg Oral Given 03/19/16 2048)   Initial Impression / Assessment and Plan / ED Course  I have reviewed the triage vital signs and the nursing notes.  Clinical Course   I have reviewed the relevant previous healthcare records. I obtained HPI from historian.  ED Course:  Assessment: Patient with urticarial eruption. No  specific trigger. Discussed that urticaria can be trigger by stress heat cold and for unknown reasons. No signs of  anaphylactic reaction; no new medications. Will treat with 40 mg predisone and pepcid in the ED due to taking 20 prednisone earlier today. Will discharge the pt home with zantac Rx and prednisone taper. Follow up with allergist in 2-3 days. Return precautions discussed. Pt is safe for discharge at this time.    Disposition/Plan:  DC Home Additional Verbal discharge instructions given and discussed with patient.  Pt Instructed to f/u with PCP and allergist in the next week for evaluation and treatment of symptoms. Return precautions given Pt acknowledges and agrees with plan  Supervising Physician Gareth Morgan, MD  Final Clinical Impressions(s) / ED Diagnoses   Final diagnoses:  Urticarial rash    New Prescriptions New Prescriptions   No medications on file    I personally performed the services described in this documentation, which was scribed in my presence. The recorded information has been reviewed and is accurate.    Shary Decamp, PA-C 03/19/16 2117    Gareth Morgan, MD 03/20/16 [phone]

## 2016-03-19 NOTE — ED Triage Notes (Signed)
Pt in c/o hives and itching onset yesterday but much worse today. Pt has taken benadryl all day with minimal relief. Alert, interactive, ambulatory in NAD.

## 2016-03-27 ENCOUNTER — Ambulatory Visit (INDEPENDENT_AMBULATORY_CARE_PROVIDER_SITE_OTHER): Payer: BLUE CROSS/BLUE SHIELD | Admitting: Pediatrics

## 2016-03-27 ENCOUNTER — Encounter: Payer: Self-pay | Admitting: Pediatrics

## 2016-03-27 VITALS — BP 148/82 | HR 82 | Temp 98.7°F | Resp 20 | Ht 66.5 in | Wt 303.6 lb

## 2016-03-27 DIAGNOSIS — J4530 Mild persistent asthma, uncomplicated: Secondary | ICD-10-CM

## 2016-03-27 DIAGNOSIS — L503 Dermatographic urticaria: Secondary | ICD-10-CM

## 2016-03-27 DIAGNOSIS — L500 Allergic urticaria: Secondary | ICD-10-CM | POA: Diagnosis not present

## 2016-03-27 DIAGNOSIS — K219 Gastro-esophageal reflux disease without esophagitis: Secondary | ICD-10-CM | POA: Diagnosis not present

## 2016-03-27 DIAGNOSIS — G4733 Obstructive sleep apnea (adult) (pediatric): Secondary | ICD-10-CM | POA: Diagnosis not present

## 2016-03-27 DIAGNOSIS — Z853 Personal history of malignant neoplasm of breast: Secondary | ICD-10-CM

## 2016-03-27 DIAGNOSIS — J301 Allergic rhinitis due to pollen: Secondary | ICD-10-CM

## 2016-03-27 DIAGNOSIS — Z9989 Dependence on other enabling machines and devices: Secondary | ICD-10-CM | POA: Diagnosis not present

## 2016-03-27 HISTORY — DX: Personal history of malignant neoplasm of breast: Z85.3

## 2016-03-27 HISTORY — DX: Dermatographic urticaria: L50.3

## 2016-03-27 HISTORY — DX: Allergic urticaria: L50.0

## 2016-03-27 MED ORDER — FAMOTIDINE 20 MG PO TABS: 20.0000 mg | ORAL_TABLET | Freq: Two times a day (BID) | ORAL | 5 refills | Status: DC

## 2016-03-27 MED ORDER — MONTELUKAST SODIUM 10 MG PO TABS: 10.0000 mg | ORAL_TABLET | Freq: Every day | ORAL | 5 refills | Status: DC

## 2016-03-27 MED ORDER — DESONIDE 0.05 % EX CREA: TOPICAL_CREAM | CUTANEOUS | 3 refills | Status: DC

## 2016-03-27 MED ORDER — ALBUTEROL SULFATE HFA 108 (90 BASE) MCG/ACT IN AERS
2.0000 | INHALATION_SPRAY | Freq: Four times a day (QID) | RESPIRATORY_TRACT | 1 refills | Status: DC | PRN
Start: 2016-03-27 — End: 2016-04-28

## 2016-03-27 NOTE — Patient Instructions (Addendum)
Environmental control of dust and mold Zyrtec 10 mg twice a day for itching Pepcid 20 mg twice a day Montelukast 10 mg once a day Fluticasone 1 spray per nostril twice a day if needed for stuffy nose Ventolin 2 puffs every 4 hours if needed for wheezing or coughing spells Avoid foods with salicylates for 2 weeks to see if the itching improves. This would include large amounts of tomato See your family doctor for the right-sided abdominal pain Desonide 0.05% cream twice a day if needed to red itchy areas

## 2016-03-27 NOTE — Progress Notes (Signed)
[facility] [id number] Dept: [phone]  New Patient Note  Patient ID: [name], female    DOB: Aug 06, 1955  Age: 60 y.o. MRN: [id number] Date of Office Visit: 03/27/2016 Referring provider: Mosie Lukes, MD [address] [place], [place] [address]    Chief Complaint: New Patient (Initial Visit) (pt started having hives 9 days ago. pt states they are every where, very itchy. Pt hasnt changed anything other then being put on prozac. )  HPI Demi K Yount presents for evaluation of hives which began October  21st. of this year The hives have  continued  off and on since then. The hives appeared to be worse with areas of elastic such as her CPAP mask. She has had a history of asthma which is worse with weather changes. At times she does have nasal congestion.. Prednisone did not give her dramatic relief. She lost her job last week. She was started on Prozac about a month ago and that is the one most recent  medication. She does not have a history of eczema. She had a mastectomy 20 years ago and was treated with tamoxifen. She has gastroesophageal reflux.. She has obstructive sleep apnea and uses CPAP. Her hives became worse after taking a shower.  Review of Systems  Constitutional: Negative.   HENT:       Nasal congestion and sinus problems for several years. Obstructive sleep apnea needing CPAP.  Eyes: Negative.   Respiratory:       History of asthma since childhood  Cardiovascular:       Hypertension. History of PACs. Fluid  retention from tamoxifen  Gastrointestinal:       Gastroesophageal reflux  Genitourinary:       Mastectomy for cancer 20 years ago treated with tamoxifen  Musculoskeletal:       Knee replacement. Osteoarthritis  Skin:       Urticaria since 03/19/2015  Neurological: Negative.   Endo/Heme/Allergies:       Adult-onset diabetes. No thyroid disease. Removal of a parathyroid adenoma  Psychiatric/Behavioral: Negative.      Outpatient Encounter Prescriptions as of 03/27/2016  Medication Sig  . albuterol (VENTOLIN HFA) 108 (90 Base) MCG/ACT inhaler Inhale 2 puffs into the lungs every 6 (six) hours as needed for wheezing or shortness of breath.  . Calcium Carbonate (CALTRATE 600) 1500 MG TABS Take 1,500 mg by mouth daily.   . cetirizine (ZYRTEC) 10 MG tablet Take 1 tablet (10 mg total) by mouth 2 (two) times daily.  . Cinnamon 500 MG capsule Take 1,000 mg by mouth daily.  . diphenhydrAMINE (BENADRYL) 25 MG tablet Take 1 tablet (25 mg total) by mouth every 8 (eight) hours as needed for itching, allergies or sleep.  Marland Kitchen EPINEPHrine 0.3 mg/0.3 mL IJ SOAJ injection Inject 0.3 mLs (0.3 mg total) into the muscle once as needed (Throat or facial swelling or difficulty breathing).  Marland Kitchen esomeprazole (NEXIUM) 40 MG capsule Take 1 capsule (40 mg total) by mouth daily as needed. TAKE 1 CAPSULE (40 MG TOTAL) BY MOUTH DAILY.  . fluticasone (FLONASE) 50 MCG/ACT nasal spray Place 2 sprays into both nostrils daily. (Patient taking differently: Place 2 sprays into both nostrils daily as needed for allergies. )  . furosemide (LASIX) 40 MG tablet Take 1 tablet (40 mg total) by mouth 2 (two) times daily. (Patient taking differently: Take 40 mg by mouth daily. )  . Ginger 500 MG CAPS Take 500 mg by mouth daily.   Marland Kitchen  metFORMIN (GLUCOPHAGE) 500 MG tablet TAKE 1 TABLET (500 MG TOTAL) BY MOUTH 2 (TWO) TIMES DAILY WITH A MEAL. (Patient taking differently: TAKE 1 TABLET (500 MG TOTAL) BY MOUTH DAILY WITH A MEAL.)  . Multiple Vitamins-Minerals (MULTIVITAMIN & MINERAL PO) Take 1 tablet by mouth daily.  . ranitidine (ZANTAC) 150 MG tablet Take 1 tablet (150 mg total) by mouth 2 (two) times daily.  Marland Kitchen telmisartan-hydrochlorothiazide (MICARDIS HCT) 80-25 MG tablet TAKE ONE TABLET BY MOUTH ONCE DAILY  . Turmeric 500 MG CAPS Take 500 mg by mouth daily.   . [DISCONTINUED] albuterol (VENTOLIN HFA) 108 (90 Base) MCG/ACT inhaler Inhale 2 puffs into the lungs  every 6 (six) hours as needed for wheezing or shortness of breath.  Marland Kitchen acetic acid-hydrocortisone (VOSOL-HC) otic solution Place 4 drops into the left ear 3 (three) times daily. (Patient not taking: Reported on 03/27/2016)  . Cholecalciferol (VITAMIN D PO) Take 1 tablet by mouth daily.  . clotrimazole (LOTRIMIN) 1 % external solution 4 drops in left ear 4 times daily 7 days (Patient not taking: Reported on 03/27/2016)  . desonide (DESOWEN) 0.05 % cream Apply twice a day if needed to red itchy areas.  . famotidine (PEPCID) 20 MG tablet Take 1 tablet (20 mg total) by mouth 2 (two) times daily.  . ferrous sulfate 325 (65 FE) MG EC tablet Take 325 mg by mouth daily.  . Melatonin 5 MG TABS Take 5 mg by mouth at bedtime as needed (For sleep.).  Marland Kitchen montelukast (SINGULAIR) 10 MG tablet Take 1 tablet (10 mg total) by mouth at bedtime.  Marland Kitchen ofloxacin (FLOXIN OTIC) 0.3 % otic solution Place 5 drops into the left ear 2 (two) times daily. (Patient not taking: Reported on 03/27/2016)  . predniSONE (DELTASONE) 20 MG tablet Day 1: 60mg  Day 2: 40mg  Day 3: 40mg  Day 4: 20mg  Day 5: 20mg  Day 6:10mg  Day 7:10mg  (Patient not taking: Reported on 03/27/2016)   No facility-administered encounter medications on file as of 03/27/2016.      Drug Allergies:  Allergies  Allergen Reactions  . Mucinex [Guaifenesin Er] Shortness Of Breath  . Penicillins Hives and Other (See Comments)    Has patient had a PCN reaction causing immediate rash, facial/tongue/throat swelling, SOB or lightheadedness with hypotension: no Has patient had a PCN reaction causing severe rash involving mucus membranes or skin necrosis: no Has patient had a PCN reaction that required hospitalization no Has patient had a PCN reaction occurring within the last 10 years: no If all of the above answers are "NO", then may proceed with Cephalosporin use.     Family History: Kayzlee's family history includes Arthritis in her maternal grandmother and  mother; Asthma in her daughter; Breast cancer in her maternal aunt and paternal grandfather; Colon cancer in her maternal aunt; Emphysema in her father; Heart disease in her paternal grandfather; Lung cancer in her father; Multiple sclerosis in her sister; Prostate cancer in her paternal grandfather.. There is no family history of angioedema, eczema, hives, food allergies or lupus  Social and environmental. She has a poodle in the home. She is not exposed to cigarette smoking. She lost her job last week but has a retirement Statistician Exam: BP (!) 148/82   Pulse 82   Temp 98.7 F (37.1 C) (Oral)   Resp 20   Ht 5' 6.5" (1.689 m)   Wt (!) 303 lb 9.6 oz (137.7 kg)   BMI 48.27 kg/m    Physical Exam  Constitutional: She is  oriented to person, place, and time. She appears well-developed and well-nourished.  HENT:  Eyes normal. Ears normal. Nose mild swelling of nasal turbinates. Pharynx normal.  Neck: Neck supple. No thyromegaly present.  Cardiovascular:  S1 and S2 normal no murmurs  Pulmonary/Chest:  Clear to percussion auscultation  Abdominal: Soft. There is tenderness ( noted about 2 inches below the right costal margin at the midline. No hepatosplenomegaly).  Lymphadenopathy:    She has no cervical adenopathy.  Neurological: She is alert and oriented to person, place, and time.  Skin:  There were a few hives noted. There were a few areas with an erythematous papulosquamous eruption. She had dermographia noted.  Psychiatric: She has a normal mood and affect. Her behavior is normal. Judgment and thought content normal.  Vitals reviewed.   Diagnostics: FVC 2.93 L FEV1 2.16 L. Predicted FVC 3.54 L predicted FEV1 2.74 L. After albuterol 2 puffs FVC 3.19 L FEV1 2.66 L- the spirometry is in the normal range and the FEV1 did improve 23%  Allergy skin tests were positive to grass pollen and some molds. There was some reactivity to tomato   Assessment  Assessment and Plan: 1.  Mild persistent asthma without complication   2. Acute seasonal allergic rhinitis due to pollen   3. Allergic urticaria   4. Dermographia   5. Obstructive sleep apnea treated with continuous positive airway pressure (CPAP)   6. History of breast cancer   7. Gastroesophageal reflux disease without esophagitis     Meds ordered this encounter  Medications  . famotidine (PEPCID) 20 MG tablet    Sig: Take 1 tablet (20 mg total) by mouth 2 (two) times daily.    Dispense:  60 tablet    Refill:  5  . montelukast (SINGULAIR) 10 MG tablet    Sig: Take 1 tablet (10 mg total) by mouth at bedtime.    Dispense:  30 tablet    Refill:  5  . desonide (DESOWEN) 0.05 % cream    Sig: Apply twice a day if needed to red itchy areas.    Dispense:  45 g    Refill:  3  . albuterol (VENTOLIN HFA) 108 (90 Base) MCG/ACT inhaler    Sig: Inhale 2 puffs into the lungs every 6 (six) hours as needed for wheezing or shortness of breath.    Dispense:  18 g    Refill:  1    Patient Instructions  Environmental control of dust and mold Zyrtec 10 mg twice a day for itching Pepcid 20 mg twice a day Montelukast 10 mg once a day Fluticasone 1 spray per nostril twice a day if needed for stuffy nose Ventolin 2 puffs every 4 hours if needed for wheezing or coughing spells Avoid foods with salicylates for 2 weeks to see if the itching improves. This would include large amounts of tomato See your family doctor for the right-sided abdominal pain Desonide 0.05% cream twice a day if needed to red itchy areas    Return in about 2 weeks (around 04/10/2016).   Thank you for the opportunity to care for this patient.  Please do not hesitate to contact me with questions.  Penne Lash, M.D.  [facility] [address] [place], [place] [address] [phone]

## 2016-04-03 ENCOUNTER — Encounter: Payer: Self-pay | Admitting: Genetic Counselor

## 2016-04-04 ENCOUNTER — Encounter: Payer: Self-pay | Admitting: Genetic Counselor

## 2016-04-04 ENCOUNTER — Ambulatory Visit (HOSPITAL_BASED_OUTPATIENT_CLINIC_OR_DEPARTMENT_OTHER): Payer: BLUE CROSS/BLUE SHIELD | Admitting: Genetic Counselor

## 2016-04-04 ENCOUNTER — Other Ambulatory Visit: Payer: BLUE CROSS/BLUE SHIELD

## 2016-04-04 DIAGNOSIS — Z801 Family history of malignant neoplasm of trachea, bronchus and lung: Secondary | ICD-10-CM

## 2016-04-04 DIAGNOSIS — Z800 Family history of malignant neoplasm of digestive organs: Secondary | ICD-10-CM

## 2016-04-04 DIAGNOSIS — Z809 Family history of malignant neoplasm, unspecified: Secondary | ICD-10-CM

## 2016-04-04 DIAGNOSIS — Z1379 Encounter for other screening for genetic and chromosomal anomalies: Secondary | ICD-10-CM | POA: Diagnosis not present

## 2016-04-04 DIAGNOSIS — Z8041 Family history of malignant neoplasm of ovary: Secondary | ICD-10-CM

## 2016-04-04 DIAGNOSIS — Z853 Personal history of malignant neoplasm of breast: Secondary | ICD-10-CM | POA: Diagnosis not present

## 2016-04-04 DIAGNOSIS — Z8042 Family history of malignant neoplasm of prostate: Secondary | ICD-10-CM

## 2016-04-04 DIAGNOSIS — Z803 Family history of malignant neoplasm of breast: Secondary | ICD-10-CM | POA: Diagnosis not present

## 2016-04-04 DIAGNOSIS — Z8639 Personal history of other endocrine, nutritional and metabolic disease: Secondary | ICD-10-CM

## 2016-04-04 NOTE — Progress Notes (Addendum)
REFERRING PROVIDER: Mosie Lukes, [address] [place], [place] [address]  PRIMARY PROVIDER:  Penni Homans, MD  PRIMARY REASON FOR VISIT:  1. History of breast cancer   2. Family history of breast cancer in female   3. Family history of ovarian cancer   4. Family history of colon cancer   5. Family history of prostate cancer   6. Family history of lung cancer   7. Family history of cancer   8. History of hyperparathyroidism      HISTORY OF PRESENT ILLNESS:   [name], a 60 y.o. female, was seen for a [facility] cancer genetics consultation at the request of Dr. Charlett Blake due to a personal history of breast cancer at 47 and family history of breast, ovarian, and other cancers.  [name] presents to clinic today to discuss the possibility of a hereditary predisposition to cancer, genetic testing, and to further clarify her future cancer risks, as well as potential cancer risks for family members.   At the age of 31, [name] was diagnosed with right breast cancer. This was treated with right mastectomy and tamoxifen.  [name] reports no additional personal history of cancer.  She has a history of hyperparathyroidism and adenoma of her parathyroid gland, for which she underwent a parathyroidectomy at the age of 79.    HORMONAL RISK FACTORS:  Menarche was at age 79 (58th grade).  First live birth at age 35-32.  OCP use for approximately 6-8 months.  Ovaries intact: no.  Hysterectomy: yes, TAH-BSO in 1992 for endometriosis.  Menopausal status: postmenopausal.  HRT use: 0 years. Colonoscopy: yes; most recent colonoscopy was approx 3 years ago, has had two colonoscopies with approx 2 total polyps; on a 5-year colonoscopy schedule. Mammogram within the last year: yes. Number of breast biopsies: 2. Up to date with pelvic exams:  yes. Any excessive radiation exposure/other exposures in the past:  Worked around asbestos in an older building; reports secondhand smoke  exposure as a child  Past Medical History:  Diagnosis Date  . Allergic rhinitis   . Anxiety   . Breast cancer ([place])   . Bronchitis   . Colon polyps   . Depression   . Diabetes mellitus    borderline but takes metformin  . Edema   . Family history of breast cancer in first degree relative   . GERD (gastroesophageal reflux disease)   . Hyperparathyroidism   . Hypertension    controlled by medications  . Obesity   . Osteoarthritis    RA  . PONV (postoperative nausea and vomiting)    occasional  . Sleep apnea   . Viral meningitis   . Vitamin D deficiency     Past Surgical History:  Procedure Laterality Date  . ABDOMINAL HYSTERECTOMY  1998  . ADENOIDECTOMY    . BREAST SURGERY     Tran flap due to breast cancer  . CESAREAN SECTION  1988  . HAND SURGERY     dog bite, right hand  . HAND SURGERY     trauma, left hand  . KNEE ARTHROSCOPY     bilateral  . left knee replacement  2010  . MASTECTOMY  01/13/94   Right breast  . parathyroid resection    . PARATHYROIDECTOMY    . rectal abscess    . SEPTOPLASTY  1980  . SHOULDER ARTHROSCOPY Right 11/09/2015   Procedure: ARTHROSCOPY SHOULDER-acromioplasty, distal clavicle resection and debridement;  Surgeon: Melrose Nakayama,  MD;  Location: [facility];  Service: Orthopedics;  Laterality: Right;  . TOE SURGERY Right    paronychia and adenoma removed  . TONSILLECTOMY    . TOTAL KNEE ARTHROPLASTY  06/18/08   [facility]  . TUMOR REMOVAL  1982   , scalp    Social History   Social History  . Marital status: Widowed    Spouse name: N/A  . Number of children: 1  . Years of education: N/A   Occupational History  . [place] History Main Topics  . Smoking status: Never Smoker  . Smokeless tobacco: Never Used  . Alcohol use Yes     Comment: occ.  . Drug use: No  . Sexual activity: Not on file   Other Topics Concern  . Not on file   Social History Narrative  . No narrative on file     FAMILY HISTORY:   We obtained a detailed, 4-generation family history.  Significant diagnoses are listed below: Family History  Problem Relation Age of Onset  . Emphysema Father   . Lung cancer Father     lung ca dx 26  . Other Father     prostate issues  . Breast cancer Maternal Aunt     dx late 75s  . Colon cancer Maternal Aunt     dx 13s  . Prostate cancer Maternal Grandfather 85    d. 85y  . Heart disease Paternal Grandfather   . Heart attack Paternal Grandfather     d. 50y  . Diabetes Paternal Grandfather   . Asthma Daughter     "seasonal"  . Arthritis Maternal Grandmother   . Aneurysm Maternal Grandmother     d. brain aneurysm at 69  . Arthritis Mother   . Multiple sclerosis Sister   . Breast cancer Sister 35    L IDC and DCIS; ER/PR+, Her2-  . Fibrocystic breast disease Sister   . Cancer Maternal Uncle     d. mouth cancer at younger age; smoker  . Other Paternal Uncle     muscle issues - couldn't walk or talk; d. 20y  . Cancer Maternal Aunt     NOS type, dx 49s  . Ovarian cancer Maternal Aunt     dx 23s; d. late 65s  . Lung cancer Maternal Uncle     d. 74y; former smoker  . Throat cancer Cousin     maternal 1st cousin; smoker  . Breast cancer Cousin 1    maternal 1st cousin  . Other Paternal Uncle     prostate issues  . Breast cancer Cousin     paternal 1st cousin dx late 49s    Ms. Pair has one daughter who is 57.  Her daughter has recently had an abnormal mammogram and is currently getting follow-ups every 6 months.  Ms. Hayton has two full sisters, ages 61 and 75.  One sister, Santiago Glad, was recently diagnosed with breast cancer at 53 and has had negative genetic testing.  Her other sister has a history of fibrocystic breasts, but has not had cancer.    Ms. Doiron mother is currently 51 and has never had cancer.  Ms. Zbikowski mother has three full sisters and three full brothers.  One sister was diagnosed with and passed away from ovarian cancer in her 53s.  Another sister  was diagnosed with an unspecified type of cancer in her 55s.  The third sister was diagnosed with breast cancer in her late  59s and with colon cancer in her 20s and she is still living.  One brother had a history of smoking and he died of lung cancer at 65.  Another brother was also a smoker and died of a mouth cancer at a young age.  He had one son who was also a smoker and who has been diagnosed with throat cancer.  One brother has never had cancer, but he has a daughter who was diagnosed with breast cancer at 41.  Ms. Dadamo maternal grandmother died of a brain aneurysm at 48.  Her grandfather died of prostate cancer at 55.  Ms. Blasco has no further information for any maternal great aunts/uncles and great grandparents.  Ms. Lamba father was diagnosed with lung cancer and passed away at 5 following infection after his lung surgery.  Prior to this cancer, he was diagnosed with a mouth cancer.  He also had a history of prostate issues, but not due to cancer to Ms. Winegardner's knowledge.  Ms. Mcfadden father had a twin sister.  She is currently 79 and has not had cancer.  He also had four full brothers, all of whom have passed away.  One brother died in his 64s and also had a similar history of prostate issues.  One brother died from "muscular issues" at 20--Ms. Cusic describes that he could not walk or talk.  The other two uncles passed away and also never had cancer.  One of these uncles had three daughters and one son, and one of his daughters was diagnosed with breast cancer in her late 63s.  Ms. Weckwerth's paternal grandmother died of age-related causes in her 82s.  Her grandfather died of a heart attack at 47, and he also had a history of diabetes.  Ms. Reber has no further information for her paternal great aunts/uncles and great grandparents.  Patient's maternal ancestors are of English/Irish descent, and paternal ancestors are of [place] descent. There is no reported Ashkenazi Jewish ancestry. There is no  known consanguinity.  GENETIC COUNSELING ASSESSMENT: Tashica RHIA BLATCHFORD is a 60 y.o. female with a personal history of early-onset breast cancer and family history of breast and ovarian cancers which is somewhat suggestive of a hereditary cancer syndrome and predisposition to cancer. We, therefore, discussed and recommended the following at today's visit.   DISCUSSION: We reviewed the characteristics, features and inheritance patterns of hereditary cancer syndromes, particularly those caused by mutations within the BRCA1/2 genes. We also discussed genetic testing, including the appropriate family members to test, the process of testing, insurance coverage and turn-around-time for results. We discussed the implications of a negative, positive and/or variant of uncertain significant result. We recommended Ms. Tomei pursue genetic testing for a 34-gene Custom Cancer Panel (the 32-gene Comprehensive Cancer Panel plus MEN1 and RET) with MSH2 Exons 1-7 Inversion Analysis through [facility].  This Custom Panel offered by [facility] Junius Roads, MD) includes sequencing and/or deletion duplication testing of the following 34 genes: APC, ATM, AXIN2, BARD1, BMPR1A, BRCA1, BRCA2, BRIP1, CDH1, CDK4, CDKN2A, CHEK2, EPCAM, FANCC, MEN1, MLH1, MSH2, MSH6, MUTYH, NBN, PALB2, PMS2, POLD1, POLE, PTEN, RAD51C, RAD51D, RET, SCG5/GREM1, SMAD4, STK11, TP53, VHL, and XRCC2.    Based on Ms. Boies's personal and family history of cancer, she meets medical criteria for genetic testing. Despite that she meets criteria, she may still have an out of pocket cost. We discussed that if her out of pocket cost for testing is over $100, the laboratory will call and confirm whether she wants  to proceed with testing.  If the out of pocket cost of testing is less than $100 she will be billed by the genetic testing laboratory.   PLAN: After considering the risks, benefits, and limitations, Ms. Kruk  provided informed consent to  pursue genetic testing and the blood sample was sent to [facility] for analysis of the 34-gene Custom Panel with MSH2 Exons 1-7 Inversion Analysis. Results should be available within approximately 2-3 weeks' time, at which point they will be disclosed by telephone to Ms. Laurance Flatten, as will any additional recommendations warranted by these results. Ms. Latorre will receive a summary of her genetic counseling visit and a copy of her results once available. This information will also be available in Epic. We encouraged Ms. Rabel to remain in contact with cancer genetics annually so that we can continuously update the family history and inform her of any changes in cancer genetics and testing that may be of benefit for her family. Ms. Tamargo questions were answered to her satisfaction today. Our contact information was provided should additional questions or concerns arise.  Thank you for the referral and allowing [name] to share in the care of your patient.   Jeanine Luz, MS, [facility] Certified Genetic Counselor [email or web address] Phone: [phone]  The patient was seen for a total of 60 minutes in face-to-face genetic counseling.  This patient was discussed with Drs. Magrinat, Lindi Adie and/or Burr Medico who agrees with the above.    _______________________________________________________________________ For Office Staff:  Number of people involved in session: 1 Was an Intern/ student involved with case: no

## 2016-04-05 ENCOUNTER — Encounter: Payer: Self-pay | Admitting: Genetic Counselor

## 2016-04-05 ENCOUNTER — Telehealth: Payer: Self-pay | Admitting: Genetic Counselor

## 2016-04-05 NOTE — Telephone Encounter (Signed)
[name] called to update the family history of cancer, as reviewed yesterday.   I have made these changes on her pedigree and in her chart.  These updates won't affect the current testing order.

## 2016-04-24 ENCOUNTER — Ambulatory Visit: Payer: BLUE CROSS/BLUE SHIELD | Admitting: Pediatrics

## 2016-04-28 ENCOUNTER — Ambulatory Visit (INDEPENDENT_AMBULATORY_CARE_PROVIDER_SITE_OTHER): Payer: BLUE CROSS/BLUE SHIELD | Admitting: Family Medicine

## 2016-04-28 ENCOUNTER — Telehealth: Payer: Self-pay | Admitting: Genetic Counselor

## 2016-04-28 VITALS — BP 102/68 | HR 79 | Temp 98.6°F | Ht 67.0 in | Wt 302.2 lb

## 2016-04-28 DIAGNOSIS — Z9989 Dependence on other enabling machines and devices: Secondary | ICD-10-CM

## 2016-04-28 DIAGNOSIS — M1990 Unspecified osteoarthritis, unspecified site: Secondary | ICD-10-CM

## 2016-04-28 DIAGNOSIS — E1140 Type 2 diabetes mellitus with diabetic neuropathy, unspecified: Secondary | ICD-10-CM

## 2016-04-28 DIAGNOSIS — I10 Essential (primary) hypertension: Secondary | ICD-10-CM | POA: Diagnosis not present

## 2016-04-28 DIAGNOSIS — J4530 Mild persistent asthma, uncomplicated: Secondary | ICD-10-CM

## 2016-04-28 DIAGNOSIS — E559 Vitamin D deficiency, unspecified: Secondary | ICD-10-CM

## 2016-04-28 DIAGNOSIS — E782 Mixed hyperlipidemia: Secondary | ICD-10-CM

## 2016-04-28 DIAGNOSIS — R059 Cough, unspecified: Secondary | ICD-10-CM

## 2016-04-28 DIAGNOSIS — J209 Acute bronchitis, unspecified: Secondary | ICD-10-CM

## 2016-04-28 DIAGNOSIS — G4733 Obstructive sleep apnea (adult) (pediatric): Secondary | ICD-10-CM

## 2016-04-28 DIAGNOSIS — R05 Cough: Secondary | ICD-10-CM

## 2016-04-28 LAB — COMPREHENSIVE METABOLIC PANEL
ALT: 23 U/L (ref 0–35)
AST: 14 U/L (ref 0–37)
Albumin: 3.9 g/dL (ref 3.5–5.2)
Alkaline Phosphatase: 67 U/L (ref 39–117)
BUN: 32 mg/dL — ABNORMAL HIGH (ref 6–23)
CO2: 31 mEq/L (ref 19–32)
Calcium: 9.5 mg/dL (ref 8.4–10.5)
Chloride: 98 mEq/L (ref 96–112)
Creatinine, Ser: 0.97 mg/dL (ref 0.40–1.20)
GFR: 61.9 mL/min (ref >60.00)
Glucose, Bld: 98 mg/dL (ref 70–99)
Potassium: 4 mEq/L (ref 3.5–5.1)
Sodium: 138 mEq/L (ref 135–145)
Total Bilirubin: 0.4 mg/dL (ref 0.2–1.2)
Total Protein: 7 g/dL (ref 6.0–8.3)

## 2016-04-28 LAB — T4, FREE: Free T4: 0.88 ng/dL (ref 0.60–1.60)

## 2016-04-28 LAB — LIPID PANEL
Cholesterol: 162 mg/dL (ref 0–200)
HDL: 50.1 mg/dL (ref >39.00)
NonHDL: 111.74
Total CHOL/HDL Ratio: 3
Triglycerides: 239 mg/dL — ABNORMAL HIGH (ref 0.0–149.0)
VLDL: 47.8 mg/dL — ABNORMAL HIGH (ref 0.0–40.0)

## 2016-04-28 LAB — CBC
HCT: 34.8 % — ABNORMAL LOW (ref 36.0–46.0)
Hemoglobin: 11.4 g/dL — ABNORMAL LOW (ref 12.0–15.0)
MCHC: 32.9 g/dL (ref 30.0–36.0)
MCV: 83.5 fl (ref 78.0–100.0)
Platelets: 285 10*3/uL (ref 150.0–400.0)
RBC: 4.16 Mil/uL (ref 3.87–5.11)
RDW: 16.3 % — ABNORMAL HIGH (ref 11.5–15.5)
WBC: 13.3 10*3/uL — ABNORMAL HIGH (ref 4.0–10.5)

## 2016-04-28 LAB — LDL CHOLESTEROL, DIRECT: Direct LDL: 92 mg/dL

## 2016-04-28 LAB — TSH: TSH: 1.26 u[IU]/mL (ref 0.35–4.50)

## 2016-04-28 LAB — VITAMIN D 25 HYDROXY (VIT D DEFICIENCY, FRACTURES): VITD: 42.91 ng/mL (ref 30.00–100.00)

## 2016-04-28 MED ORDER — MONTELUKAST SODIUM 10 MG PO TABS: 10.0000 mg | ORAL_TABLET | Freq: Every day | ORAL | 5 refills | Status: DC

## 2016-04-28 MED ORDER — SULFAMETHOXAZOLE-TRIMETHOPRIM 800-160 MG PO TABS: 1.0000 | ORAL_TABLET | Freq: Two times a day (BID) | ORAL | 0 refills | Status: DC

## 2016-04-28 MED ORDER — ALBUTEROL SULFATE HFA 108 (90 BASE) MCG/ACT IN AERS: 2.0000 | INHALATION_SPRAY | Freq: Four times a day (QID) | RESPIRATORY_TRACT | 1 refills | Status: DC | PRN

## 2016-04-28 NOTE — Assessment & Plan Note (Signed)
Flares with illness and seasonal allergies, is flared with recent illness, refill given on ProAir

## 2016-04-28 NOTE — Assessment & Plan Note (Signed)
Check level today, only took one dose of vit D today

## 2016-04-28 NOTE — Assessment & Plan Note (Signed)
Using CPAP regularly

## 2016-04-28 NOTE — Patient Instructions (Signed)
Tylenol ES 500 mg 2 tabs twice d, aily, max of 3000 mg in 24 hours (6 tabs)  NOW probiotics 10 strain cap take 1 daily  Food Choices for Gastroesophageal Reflux Disease, Adult When you have gastroesophageal reflux disease (GERD), the foods you eat and your eating habits are very important. Choosing the right foods can help ease your discomfort. What guidelines do I need to follow?  Choose fruits, vegetables, whole grains, and low-fat dairy products.  Choose low-fat meat, fish, and poultry.  Limit fats such as oils, salad dressings, butter, nuts, and avocado.  Keep a food diary. This helps you identify foods that cause symptoms.  Avoid foods that cause symptoms. These may be different for everyone.  Eat small meals often instead of 3 large meals a day.  Eat your meals slowly, in a place where you are relaxed.  Limit fried foods.  Cook foods using methods other than frying.  Avoid drinking alcohol.  Avoid drinking large amounts of liquids with your meals.  Avoid bending over or lying down until 2-3 hours after eating. What foods are not recommended? These are some foods and drinks that may make your symptoms worse: Vegetables  Tomatoes. Tomato juice. Tomato and spaghetti sauce. Chili peppers. Onion and garlic. Horseradish. Fruits  Oranges, grapefruit, and lemon (fruit and juice). Meats  High-fat meats, fish, and poultry. This includes hot dogs, ribs, ham, sausage, salami, and bacon. Dairy  Whole milk and chocolate milk. Sour cream. Cream. Butter. Ice cream. Cream cheese. Drinks  Coffee and tea. Bubbly (carbonated) drinks or energy drinks. Condiments  Hot sauce. Barbecue sauce. Sweets/Desserts  Chocolate and cocoa. Donuts. Peppermint and spearmint. Fats and Oils  High-fat foods. This includes [place] fries and potato chips. Other  Vinegar. Strong spices. This includes black pepper, white pepper, red pepper, cayenne, curry powder, cloves, ginger, and chili powder. The  items listed above may not be a complete list of foods and drinks to avoid. Contact your dietitian for more information.  This information is not intended to replace advice given to you by your health care provider. Make sure you discuss any questions you have with your health care provider. Document Released: 11/14/2011 Document Revised: 10/21/2015 Document Reviewed: 03/19/2013 Elsevier Interactive Patient Education  2017 [employer].

## 2016-04-28 NOTE — Assessment & Plan Note (Signed)
Struggles with severe symptoms in hands, legs and recently left shoulder pain has had a shot by Dr Novella Olive helped for a few days. Taking Ibuprofen 2 twice daily. Add tylenol ES bid. F/u with ortho

## 2016-04-28 NOTE — Assessment & Plan Note (Signed)
Well controlled, no changes to meds. Encouraged heart healthy diet such as the DASH diet and exercise as tolerated.  °

## 2016-04-28 NOTE — Telephone Encounter (Signed)
Discussed with [name] that her genetic test results were negative for mutations within any of 34 genes on the Custom Cancer Panel through [facility].  Additionally, no variants of uncertain significance (VUSes) were found.  Discussed that we could still be missing something on our current testing, as there is no way to be certain if our current test is perfect.  Encouraged genetic testing of other affected maternal relatives.  Women in the family are still at a higher risk for breast and ovarian cancer.  [name] should continue to follow her doctors' recommendations.  Her sister who has not had breast cancer is likely eligible for breast MRIs, as she has now had two sisters who have had breast cancer.  [name] daughter is already getting annual mammograms.  [name] niece can get her first mammogram at 34.  [name] is welcome to call or email with any questions.  She would like a copy of her result emailed and I am happy to do that.

## 2016-04-28 NOTE — Progress Notes (Signed)
Pre visit review using our clinic review tool, if applicable. No additional management support is needed unless otherwise documented below in the visit note. 

## 2016-05-01 ENCOUNTER — Other Ambulatory Visit: Payer: Self-pay | Admitting: Family Medicine

## 2016-05-01 ENCOUNTER — Ambulatory Visit: Payer: Self-pay | Admitting: Genetic Counselor

## 2016-05-01 DIAGNOSIS — Z809 Family history of malignant neoplasm, unspecified: Secondary | ICD-10-CM

## 2016-05-01 DIAGNOSIS — Z853 Personal history of malignant neoplasm of breast: Secondary | ICD-10-CM

## 2016-05-01 DIAGNOSIS — Z8041 Family history of malignant neoplasm of ovary: Secondary | ICD-10-CM

## 2016-05-01 DIAGNOSIS — Z1379 Encounter for other screening for genetic and chromosomal anomalies: Secondary | ICD-10-CM

## 2016-05-01 DIAGNOSIS — Z803 Family history of malignant neoplasm of breast: Secondary | ICD-10-CM

## 2016-05-01 DIAGNOSIS — D649 Anemia, unspecified: Secondary | ICD-10-CM

## 2016-05-01 MED ORDER — FERROUS FUMARATE-FOLIC ACID 324-1 MG PO TABS
1.0000 | ORAL_TABLET | Freq: Every day | ORAL | 6 refills | Status: DC
Start: 2016-05-01 — End: 2017-07-17

## 2016-05-07 NOTE — Assessment & Plan Note (Signed)
Encouraged DASH diet, decrease po intake and increase exercise as tolerated. Needs 7-8 hours of sleep nightly. Avoid trans fats, eat small, frequent meals every 4-5 hours with lean proteins, complex carbs and healthy fats. Minimize simple carbs 

## 2016-05-07 NOTE — Assessment & Plan Note (Addendum)
minimize simple carbs. Increase exercise as tolerated. Continue current meds  

## 2016-05-07 NOTE — Assessment & Plan Note (Signed)
encouraged to treat her allergies and reflux daily. Due to recent flare in symptoms will treat an acute respiratory illness as well and she will report if no improvement

## 2016-05-07 NOTE — Progress Notes (Signed)
Patient ID: [name], female   DOB: 05/23/1956, 60 y.o.   MRN: [id number]   Subjective:    Patient ID: [name], female    DOB: 07-03-1955, 60 y.o.   MRN: [id number]  Chief Complaint  Patient presents with  . Follow-up    HPI Patient is in today for follow up. She is struggling with with sinus congestion, headache, cough, malaise and sputum production thick and green. Also notes fatigue, sore throat and malaise. Has been under a great deal of stress due to loosing her job and is noting increased pain in hands and legs. Is noting left shoulder pain is being treated by Dr Novella Olive for left shoulder pain. Denies CP/palp/SOB/HA/fevers/GI or GU c/o. Taking meds as prescribed  Past Medical History:  Diagnosis Date  . Allergic rhinitis   . Anxiety   . Breast cancer ([place])   . Bronchitis   . Colon polyps   . Depression   . Diabetes mellitus    borderline but takes metformin  . Edema   . Family history of breast cancer in first degree relative   . GERD (gastroesophageal reflux disease)   . Hyperparathyroidism   . Hypertension    controlled by medications  . Obesity   . Osteoarthritis    RA  . PONV (postoperative nausea and vomiting)    occasional  . Sleep apnea   . Viral meningitis   . Vitamin D deficiency     Past Surgical History:  Procedure Laterality Date  . ABDOMINAL HYSTERECTOMY  1998  . ADENOIDECTOMY    . BREAST SURGERY     Tran flap due to breast cancer  . CESAREAN SECTION  1988  . HAND SURGERY     dog bite, right hand  . HAND SURGERY     trauma, left hand  . KNEE ARTHROSCOPY     bilateral  . left knee replacement  2010  . MASTECTOMY  01/13/94   Right breast  . parathyroid resection    . PARATHYROIDECTOMY    . rectal abscess    . SEPTOPLASTY  1980  . SHOULDER ARTHROSCOPY Right 11/09/2015   Procedure: ARTHROSCOPY SHOULDER-acromioplasty, distal clavicle resection and debridement;  Surgeon: Melrose Nakayama, MD;  Location: [facility];  Service: Orthopedics;   Laterality: Right;  . TOE SURGERY Right    paronychia and adenoma removed  . TONSILLECTOMY    . TOTAL KNEE ARTHROPLASTY  06/18/08   [facility]  . TUMOR REMOVAL  1982   , scalp    Family History  Problem Relation Age of Onset  . Emphysema Father   . Lung cancer Father     lung ca dx 54  . Other Father     prostate issues  . Breast cancer Maternal Aunt     dx late 46s  . Colon cancer Maternal Aunt     dx 75s  . Prostate cancer Maternal Grandfather 85    d. 85y  . Heart disease Paternal Grandfather   . Heart attack Paternal Grandfather     d. 50y  . Diabetes Paternal Grandfather   . Asthma Daughter     "seasonal"  . Arthritis Maternal Grandmother   . Aneurysm Maternal Grandmother     d. brain aneurysm at 19  . Arthritis Mother   . Multiple sclerosis Sister   . Breast cancer Sister 38    L IDC and DCIS; ER/PR+, Her2-  . Fibrocystic breast disease Sister   . Cancer Maternal Uncle  d. mouth cancer at younger age; smoker  . Other Paternal Uncle     muscle issues - couldn't walk or talk; d. 20y  . Cancer Maternal Aunt     lymphoma, dx 56s  . Ovarian cancer Maternal Aunt     dx 47s; d. late 20s  . Lung cancer Maternal Uncle     d. 26y; former smoker  . Throat cancer Cousin     maternal 1st cousin; smoker  . Breast cancer Cousin 1    maternal 1st cousin  . Other Paternal Uncle     prostate issues  . Breast cancer Cousin     paternal 1st cousin dx late 21s  . Throat cancer Cousin     maternal 1st cousin; used SL tobacco  . Prostate cancer Cousin     maternal 1st cousin dx 38s    Social History   Social History  . Marital status: Widowed    Spouse name: N/A  . Number of children: 1  . Years of education: N/A   Occupational History  . [place] History Main Topics  . Smoking status: Never Smoker  . Smokeless tobacco: Never Used  . Alcohol use Yes     Comment: occ.  . Drug use: No  . Sexual activity: Not on file   Other  Topics Concern  . Not on file   Social History Narrative  . No narrative on file    Outpatient Medications Prior to Visit  Medication Sig Dispense Refill  . acetic acid-hydrocortisone (VOSOL-HC) otic solution Place 4 drops into the left ear 3 (three) times daily. 10 mL 0  . Calcium Carbonate (CALTRATE 600) 1500 MG TABS Take 1,500 mg by mouth daily.     . cetirizine (ZYRTEC) 10 MG tablet Take 1 tablet (10 mg total) by mouth 2 (two) times daily. 60 tablet 5  . Cholecalciferol (VITAMIN D PO) Take 1 tablet by mouth daily.    . Cinnamon 500 MG capsule Take 1,000 mg by mouth daily.    . clotrimazole (LOTRIMIN) 1 % external solution 4 drops in left ear 4 times daily 7 days 30 mL 0  . desonide (DESOWEN) 0.05 % cream Apply twice a day if needed to red itchy areas. 45 g 3  . diphenhydrAMINE (BENADRYL) 25 MG tablet Take 1 tablet (25 mg total) by mouth every 8 (eight) hours as needed for itching, allergies or sleep. 30 tablet 0  . EPINEPHrine 0.3 mg/0.3 mL IJ SOAJ injection Inject 0.3 mLs (0.3 mg total) into the muscle once as needed (Throat or facial swelling or difficulty breathing). 1 Device 0  . esomeprazole (NEXIUM) 40 MG capsule Take 1 capsule (40 mg total) by mouth daily as needed. TAKE 1 CAPSULE (40 MG TOTAL) BY MOUTH DAILY. 90 capsule 1  . famotidine (PEPCID) 20 MG tablet Take 1 tablet (20 mg total) by mouth 2 (two) times daily. 60 tablet 5  . ferrous sulfate 325 (65 FE) MG EC tablet Take 325 mg by mouth daily.    . fluticasone (FLONASE) 50 MCG/ACT nasal spray Place 2 sprays into both nostrils daily. (Patient taking differently: Place 2 sprays into both nostrils daily as needed for allergies. ) 16 g 1  . furosemide (LASIX) 40 MG tablet Take 1 tablet (40 mg total) by mouth 2 (two) times daily. (Patient taking differently: Take 40 mg by mouth daily. ) 60 tablet 3  . Ginger 500 MG CAPS Take 500 mg by mouth  daily.     . Melatonin 5 MG TABS Take 5 mg by mouth at bedtime as needed (For sleep.).    Marland Kitchen  metFORMIN (GLUCOPHAGE) 500 MG tablet TAKE 1 TABLET (500 MG TOTAL) BY MOUTH 2 (TWO) TIMES DAILY WITH A MEAL. (Patient taking differently: TAKE 1 TABLET (500 MG TOTAL) BY MOUTH DAILY WITH A MEAL.) 180 tablet 1  . Multiple Vitamins-Minerals (MULTIVITAMIN & MINERAL PO) Take 1 tablet by mouth daily.    Marland Kitchen ofloxacin (FLOXIN OTIC) 0.3 % otic solution Place 5 drops into the left ear 2 (two) times daily. 5 mL 0  . predniSONE (DELTASONE) 20 MG tablet Day 1: 60m Day 2: 453mDay 3: 4036may 4: 56m4my 5: 56mg52m 6:10mg 63m7:10mg 184mblet 0  . ranitidine (ZANTAC) 150 MG tablet Take 1 tablet (150 mg total) by mouth 2 (two) times daily. 60 tablet 5  . telmisartan-hydrochlorothiazide (MICARDIS HCT) 80-25 MG tablet TAKE ONE TABLET BY MOUTH ONCE DAILY 90 tablet 1  . Turmeric 500 MG CAPS Take 500 mg by mouth daily.     . albutMarland Kitchenrol (VENTOLIN HFA) 108 (90 Base) MCG/ACT inhaler Inhale 2 puffs into the lungs every 6 (six) hours as needed for wheezing or shortness of breath. 18 g 1  . montelukast (SINGULAIR) 10 MG tablet Take 1 tablet (10 mg total) by mouth at bedtime. 30 tablet 5   No facility-administered medications prior to visit.     Allergies  Allergen Reactions  . Mucinex [Guaifenesin Er] Shortness Of Breath  . Penicillins Hives and Other (See Comments)    Has patient had a PCN reaction causing immediate rash, facial/tongue/throat swelling, SOB or lightheadedness with hypotension: no Has patient had a PCN reaction causing severe rash involving mucus membranes or skin necrosis: no Has patient had a PCN reaction that required hospitalization no Has patient had a PCN reaction occurring within the last 10 years: no If all of the above answers are "NO", then may proceed with Cephalosporin use.   . Prozac [Fluoxetine Hcl] Hives    Review of Systems  Constitutional: Negative for chills, fever and malaise/fatigue.  HENT: Positive for congestion, sinus pain and sore throat.   Eyes: Negative for blurred  vision.  Respiratory: Positive for cough. Negative for shortness of breath.   Cardiovascular: Negative for chest pain, palpitations and leg swelling.  Gastrointestinal: Negative for abdominal pain, blood in stool and nausea.  Genitourinary: Negative for dysuria and frequency.  Musculoskeletal: Negative for falls.  Skin: Negative for rash.  Neurological: Negative for dizziness, loss of consciousness and headaches.  Endo/Heme/Allergies: Negative for environmental allergies.  Psychiatric/Behavioral: Negative for depression. The patient is not nervous/anxious.        Objective:    Physical Exam  Constitutional: She is oriented to person, place, and time. She appears well-developed and well-nourished. No distress.  HENT:  Head: Normocephalic and atraumatic.  Nose: Nose normal.  Nasal mucosa erythematous and boggy  Eyes: Right eye exhibits no discharge. Left eye exhibits no discharge.  Neck: Normal range of motion. Neck supple. No thyromegaly present.  Cardiovascular: Normal rate and regular rhythm.   No murmur heard. Pulmonary/Chest: Effort normal and breath sounds normal.  Decreased breath sounds b/l bases.  Abdominal: Soft. Bowel sounds are normal. There is no tenderness.  Musculoskeletal: She exhibits no edema.  Lymphadenopathy:    She has cervical adenopathy.  Neurological: She is alert and oriented to person, place, and time.  Skin: Skin is warm and dry.  Psychiatric: She  has a normal mood and affect.  Nursing note and vitals reviewed.   BP 102/68 (BP Location: Left Arm, Patient Position: Sitting, Cuff Size: Large)   Pulse 79   Temp 98.6 F (37 C) (Oral)   Ht '5\' 7"'  (1.702 m)   Wt (!) 302 lb 4 oz (137.1 kg)   SpO2 96%   BMI 47.34 kg/m  Wt Readings from Last 3 Encounters:  04/28/16 (!) 302 lb 4 oz (137.1 kg)  03/27/16 (!) 303 lb 9.6 oz (137.7 kg)  03/19/16 297 lb (134.7 kg)     Lab Results  Component Value Date   WBC 13.3 (H) 04/28/2016   HGB 11.4 (L) 04/28/2016     HCT 34.8 (L) 04/28/2016   PLT 285.0 04/28/2016   GLUCOSE 98 04/28/2016   CHOL 162 04/28/2016   TRIG 239.0 (H) 04/28/2016   HDL 50.10 04/28/2016   LDLDIRECT 92.0 04/28/2016   LDLCALC 99 01/21/2015   ALT 23 04/28/2016   AST 14 04/28/2016   NA 138 04/28/2016   K 4.0 04/28/2016   CL 98 04/28/2016   CREATININE 0.97 04/28/2016   BUN 32 (H) 04/28/2016   CO2 31 04/28/2016   TSH 1.26 04/28/2016   INR 2.0 (H) 06/21/2008   HGBA1C 6.1 11/18/2015    Lab Results  Component Value Date   TSH 1.26 04/28/2016   Lab Results  Component Value Date   WBC 13.3 (H) 04/28/2016   HGB 11.4 (L) 04/28/2016   HCT 34.8 (L) 04/28/2016   MCV 83.5 04/28/2016   PLT 285.0 04/28/2016   Lab Results  Component Value Date   NA 138 04/28/2016   K 4.0 04/28/2016   CO2 31 04/28/2016   GLUCOSE 98 04/28/2016   BUN 32 (H) 04/28/2016   CREATININE 0.97 04/28/2016   BILITOT 0.4 04/28/2016   ALKPHOS 67 04/28/2016   AST 14 04/28/2016   ALT 23 04/28/2016   PROT 7.0 04/28/2016   ALBUMIN 3.9 04/28/2016   CALCIUM 9.5 04/28/2016   ANIONGAP 11 11/09/2015   GFR 61.90 04/28/2016   Lab Results  Component Value Date   CHOL 162 04/28/2016   Lab Results  Component Value Date   HDL 50.10 04/28/2016   Lab Results  Component Value Date   LDLCALC 99 01/21/2015   Lab Results  Component Value Date   TRIG 239.0 (H) 04/28/2016   Lab Results  Component Value Date   CHOLHDL 3 04/28/2016   Lab Results  Component Value Date   HGBA1C 6.1 11/18/2015       Assessment & Plan:   Problem List Items Addressed This Visit    Vitamin D deficiency    Check level today, only took one dose of vit D today       Relevant Orders   T4, free (Completed)   Essential hypertension    Well controlled, no changes to meds. Encouraged heart healthy diet such as the DASH diet and exercise as tolerated.       Relevant Orders   CBC (Completed)   Comprehensive metabolic panel (Completed)   TSH (Completed)   Osteoarthritis     Struggles with severe symptoms in hands, legs and recently left shoulder pain has had a shot by Dr Novella Olive helped for a few days. Taking Ibuprofen 2 twice daily. Add tylenol ES bid. F/u with ortho      Relevant Orders   VITAMIN D 25 Hydroxy (Vit-D Deficiency, Fractures) (Completed)   Diabetes mellitus, type 2 (HCC)    minimize  simple carbs. Increase exercise as tolerated. Continue current meds      Bronchitis, acute    Started on Bactrim and probiotics.       Obstructive sleep apnea treated with continuous positive airway pressure (CPAP)    Using CPAP regularly      Cough    encouraged to treat her allergies and reflux daily. Due to recent flare in symptoms will treat an acute respiratory illness as well and she will report if no improvement      Mild persistent asthma without complication    Flares with illness and seasonal allergies, is flared with recent illness, refill given on ProAir      Relevant Medications   montelukast (SINGULAIR) 10 MG tablet   albuterol (VENTOLIN HFA) 108 (90 Base) MCG/ACT inhaler    Other Visit Diagnoses    Hyperlipidemia, mixed    -  Primary   Relevant Orders   Lipid panel (Completed)      I am having Ms. Speers start on sulfamethoxazole-trimethoprim. I am also having her maintain her calcium carbonate, ferrous sulfate, metFORMIN, Cinnamon, Multiple Vitamins-Minerals (MULTIVITAMIN & MINERAL PO), Ginger, Turmeric, fluticasone, Melatonin, Cholecalciferol (VITAMIN D PO), cetirizine, esomeprazole, ranitidine, diphenhydrAMINE, clotrimazole, EPINEPHrine, acetic acid-hydrocortisone, ofloxacin, furosemide, telmisartan-hydrochlorothiazide, predniSONE, famotidine, desonide, montelukast, and albuterol.  Meds ordered this encounter  Medications  . montelukast (SINGULAIR) 10 MG tablet    Sig: Take 1 tablet (10 mg total) by mouth at bedtime.    Dispense:  30 tablet    Refill:  5  . sulfamethoxazole-trimethoprim (BACTRIM DS,SEPTRA DS) 800-160 MG tablet     Sig: Take 1 tablet by mouth 2 (two) times daily.    Dispense:  20 tablet    Refill:  0  . albuterol (VENTOLIN HFA) 108 (90 Base) MCG/ACT inhaler    Sig: Inhale 2 puffs into the lungs every 6 (six) hours as needed for wheezing or shortness of breath.    Dispense:  18 g    Refill:  1     Jesusmanuel Erbes, MD

## 2016-05-07 NOTE — Assessment & Plan Note (Signed)
Started on Bactrim and probiotics.

## 2016-05-09 ENCOUNTER — Other Ambulatory Visit: Payer: Self-pay | Admitting: Orthopaedic Surgery

## 2016-05-09 DIAGNOSIS — M25512 Pain in left shoulder: Secondary | ICD-10-CM

## 2016-05-10 ENCOUNTER — Ambulatory Visit
Admission: RE | Admit: 2016-05-10 | Discharge: 2016-05-10 | Disposition: A | Discharge status: Home / Self Care | Payer: BLUE CROSS/BLUE SHIELD | Source: Ambulatory Visit | Attending: Orthopaedic Surgery | Admitting: Orthopaedic Surgery

## 2016-05-10 DIAGNOSIS — M25512 Pain in left shoulder: Secondary | ICD-10-CM

## 2016-05-15 DIAGNOSIS — Z1379 Encounter for other screening for genetic and chromosomal anomalies: Secondary | ICD-10-CM | POA: Insufficient documentation

## 2016-05-15 HISTORY — DX: Encounter for other screening for genetic and chromosomal anomalies: Z13.79

## 2016-05-15 NOTE — Progress Notes (Signed)
GENETIC TEST RESULT  HPI: Ms. [name] was previously seen in the [facility] due to a personal history of breast cancer at age 60, personal history of parathyroid adenoma, family history of breast, ovarian, prostate, and other cancers, and concerns regarding a hereditary predisposition to cancer. Please refer to our prior cancer genetics clinic note from April 04, 2016 for more information regarding Ms. [name]'s medical, social and family histories, and our assessment and recommendations, at the time. Ms. [name] recent genetic test results were disclosed to her, as were recommendations warranted by these results. These results and recommendations are discussed in more detail below.  GENETIC TEST RESULTS: Genetic testing reported out on April 25, 2016 through the 34-gene Custom Cancer Panel (with MSH2 Exons 1-7 Inversion Analysis) found no deleterious mutations.  Additionally, no variants of uncertain significance (VUSes) were found. This Custom Panel performed by [facility] Junius Roads, MD) includes sequencing and/or deletion duplication testing of the following 34 genes: APC, ATM, AXIN2, BARD1, BMPR1A, BRCA1, BRCA2, BRIP1, CDH1, CDK4, CDKN2A, CHEK2, EPCAM, FANCC, MEN1, MLH1, MSH2, MSH6, MUTYH, NBN, PALB2, PMS2, POLD1, POLE, PTEN, RAD51C, RAD51D, RET, SCG5/GREM1, SMAD4, STK11, TP53, VHL, and XRCC2. The test report will be scanned into EPIC and will be located under the Molecular Pathology section of the Results Review tab.   We discussed with Ms. [name] that since the current genetic testing is not perfect, it is possible there may be a gene mutation in one of these genes that current testing cannot detect, but that chance is small. We also discussed, that it is possible that another gene that has not yet been discovered, or that we have not yet tested, is responsible for the cancer diagnoses in the family, and it is, therefore, important to remain in touch with cancer  genetics in the future so that we can continue to offer Ms. [name] the most up-to-date genetic testing.     CANCER SCREENING RECOMMENDATIONS: Given Ms. [name]'s personal and family histories, we must interpret these negative results with some caution.  Families with features suggestive of hereditary risk for cancer tend to have multiple family members with cancer, diagnoses in multiple generations and diagnoses before the age of 25. Ms. [name] family exhibits some of these features. Thus this result may simply reflect our current inability to detect all mutations within these genes or there may be a different gene that has not yet been discovered or tested. Further testing of other affected family members may be helpful for further understanding of the personal and familial cancer risks. In the meantime, Ms. [name] should continue to follow her doctors' recommendations for cancer screening and these recommendations should be based on her personal and family history of cancer.  RECOMMENDATIONS FOR FAMILY MEMBERS: Men and women in this family might be at some increased risk of developing cancer, over the general population risk, simply due to the family history of cancer. We recommended women in this family have a yearly mammogram beginning at age 24, or 30 years younger than the earliest onset of cancer, an annual clinical breast exam, and perform monthly breast self-exams. Ms. [name]'s sister who has not been diagnosed with breast cancer may be eligible for additional breast cancer screening, such as breast MRIs, as she now has two sisters who have been diagnosed with breast cancer.  Ms. [name] daughter and niece can begin annual mammogram or breast MRI screening at age 71 based her history of breast cancer diagnosis at age 82.  Ms. [name] daughter  is already getting mammograms.  Women in this family should also have a gynecological exam as recommended by their primary provider. Men in the family should discuss  prostate cancer screening twith their doctors.  All family members should have a colonoscopy by age 69.  Based on Ms. [name]'s family history, we recommended her maternal aunt, who was diagnosed with breast cancer in her late 53s and colon cancer in her 105s, have genetic counseling and testing.  Other maternal relatives, such as the children of Ms. Ksiazek's aunt who passed from ovarian cancer, are also likely eligible for genetic counseling and testing.  Ms. [name] will let us know if we can be of any assistance in coordinating genetic counseling and/or testing for these family members.   FOLLOW-UP: Lastly, we discussed with Ms. [name] that cancer genetics is a rapidly advancing field and it is possible that new genetic tests will be appropriate for her and/or her family members in the future. We encouraged her to remain in contact with cancer genetics on an annual basis so we can update her personal and family histories and let her know of advances in cancer genetics that may benefit this family.   Our contact number was provided. Ms. [name] questions were answered to her satisfaction, and she knows she is welcome to call us at anytime with additional questions or concerns.   Holly Luz, MS, [facility], [place] Certified Genetic Counselor [email or web address] Phone: [phone]

## 2016-05-19 ENCOUNTER — Encounter: Payer: Self-pay | Admitting: *Deleted

## 2016-05-19 ENCOUNTER — Telehealth: Payer: Self-pay | Admitting: Family Medicine

## 2016-05-19 ENCOUNTER — Other Ambulatory Visit (INDEPENDENT_AMBULATORY_CARE_PROVIDER_SITE_OTHER): Payer: BLUE CROSS/BLUE SHIELD

## 2016-05-19 DIAGNOSIS — D649 Anemia, unspecified: Secondary | ICD-10-CM

## 2016-05-19 LAB — CBC WITH DIFFERENTIAL/PLATELET
Basophils Absolute: 0.1 10*3/uL (ref 0.0–0.1)
Basophils Relative: 0.7 % (ref 0.0–3.0)
Eosinophils Absolute: 0.2 10*3/uL (ref 0.0–0.7)
Eosinophils Relative: 2.2 % (ref 0.0–5.0)
HCT: 34.7 % — ABNORMAL LOW (ref 36.0–46.0)
Hemoglobin: 11.5 g/dL — ABNORMAL LOW (ref 12.0–15.0)
Lymphocytes Relative: 39.1 % (ref 12.0–46.0)
Lymphs Abs: 2.9 10*3/uL (ref 0.7–4.0)
MCHC: 33.1 g/dL (ref 30.0–36.0)
MCV: 83.3 fl (ref 78.0–100.0)
Monocytes Absolute: 0.8 10*3/uL (ref 0.1–1.0)
Monocytes Relative: 10.9 % (ref 3.0–12.0)
Neutro Abs: 3.5 10*3/uL (ref 1.4–7.7)
Neutrophils Relative %: 47.1 % (ref 43.0–77.0)
Platelets: 281 10*3/uL (ref 150.0–400.0)
RBC: 4.17 Mil/uL (ref 3.87–5.11)
RDW: 15.8 % — ABNORMAL HIGH (ref 11.5–15.5)
WBC: 7.4 10*3/uL (ref 4.0–10.5)

## 2016-05-19 MED ORDER — METFORMIN HCL 500 MG PO TABS: ORAL_TABLET | ORAL | 0 refills | Status: DC

## 2016-05-19 NOTE — Telephone Encounter (Signed)
OK to take twice daily for now. Would recommend scheduling DM visit with Dr. Charlett Blake for her 6 mo check. This can be further discussed at this appt. TY.

## 2016-05-19 NOTE — Telephone Encounter (Signed)
Patient is requesting a refill of albuterol (VENTOLIN HFA) 108 (90 Base) MCG/ACT inhaler  And  metFORMIN (GLUCOPHAGE) 500 MG tablet Please advise   Pharmacy: [facility], [place]

## 2016-05-19 NOTE — Telephone Encounter (Signed)
Medication filled to pharmacy as requested. MyChart message sent to pt to contact pharmacy for albuterol refill.

## 2016-05-19 NOTE — Telephone Encounter (Signed)
Albuterol filled 04/28/16, 18 g, 1 RF. Pt should not be out of this.   Please advise sig for metformin refill. Last filled 04/2015, 1 tab BID. Chart states pt is taking 1 tab daily.   Lab Results  Component Value Date   HGBA1C 6.1 11/18/2015

## 2016-05-30 MED ORDER — ALBUTEROL SULFATE HFA 108 (90 BASE) MCG/ACT IN AERS: 2.0000 | INHALATION_SPRAY | Freq: Four times a day (QID) | RESPIRATORY_TRACT | 1 refills | Status: DC | PRN

## 2016-05-30 NOTE — Telephone Encounter (Signed)
Medication filled to pharmacy as requested.   

## 2016-06-29 ENCOUNTER — Other Ambulatory Visit: Payer: Self-pay | Admitting: Obstetrics and Gynecology

## 2016-06-29 DIAGNOSIS — Z9011 Acquired absence of right breast and nipple: Secondary | ICD-10-CM

## 2016-06-29 DIAGNOSIS — Z853 Personal history of malignant neoplasm of breast: Secondary | ICD-10-CM

## 2016-07-11 ENCOUNTER — Encounter: Payer: Self-pay | Admitting: Family Medicine

## 2016-07-11 ENCOUNTER — Ambulatory Visit (INDEPENDENT_AMBULATORY_CARE_PROVIDER_SITE_OTHER): Payer: BLUE CROSS/BLUE SHIELD | Admitting: Family Medicine

## 2016-07-11 VITALS — BP 112/66 | HR 90 | Temp 98.4°F | Ht 67.0 in | Wt 297.8 lb

## 2016-07-11 DIAGNOSIS — I10 Essential (primary) hypertension: Secondary | ICD-10-CM | POA: Diagnosis not present

## 2016-07-11 DIAGNOSIS — F418 Other specified anxiety disorders: Secondary | ICD-10-CM

## 2016-07-11 DIAGNOSIS — E1140 Type 2 diabetes mellitus with diabetic neuropathy, unspecified: Secondary | ICD-10-CM

## 2016-07-11 DIAGNOSIS — D649 Anemia, unspecified: Secondary | ICD-10-CM | POA: Diagnosis not present

## 2016-07-11 DIAGNOSIS — Z0000 Encounter for general adult medical examination without abnormal findings: Secondary | ICD-10-CM | POA: Diagnosis not present

## 2016-07-11 DIAGNOSIS — G4700 Insomnia, unspecified: Secondary | ICD-10-CM | POA: Diagnosis not present

## 2016-07-11 DIAGNOSIS — E6609 Other obesity due to excess calories: Secondary | ICD-10-CM | POA: Diagnosis not present

## 2016-07-11 MED ORDER — SERTRALINE HCL 25 MG PO TABS: 25.0000 mg | ORAL_TABLET | Freq: Every day | ORAL | 1 refills | Status: DC

## 2016-07-11 NOTE — Assessment & Plan Note (Signed)
Maintain adequate hydration 

## 2016-07-11 NOTE — Progress Notes (Signed)
Pre visit review using our clinic review tool, if applicable. No additional management support is needed unless otherwise documented below in the visit note. 

## 2016-07-11 NOTE — Patient Instructions (Addendum)
Spoke with you in regards to [place]. At your convenience, please bring in a notarized copy of your Healthcare Power of [place] Will into Dr. Frederik Pear Office to be scanned into your Chart. Apply Lidocaine gel to the affected twice daily. Preventive Care 40-64 Years, Female Preventive care refers to lifestyle choices and visits with your health care provider that can promote health and wellness. What does preventive care include?  A yearly physical exam. This is also called an annual well check.  Dental exams once or twice a year.  Routine eye exams. Ask your health care provider how often you should have your eyes checked.  Personal lifestyle choices, including:  Daily care of your teeth and gums.  Regular physical activity.  Eating a healthy diet.  Avoiding tobacco and drug use.  Limiting alcohol use.  Practicing safe sex.  Taking low-dose aspirin daily starting at age 72.  Taking vitamin and mineral supplements as recommended by your health care provider. What happens during an annual well check? The services and screenings done by your health care provider during your annual well check will depend on your age, overall health, lifestyle risk factors, and family history of disease. Counseling  Your health care provider may ask you questions about your:  Alcohol use.  Tobacco use.  Drug use.  Emotional well-being.  Home and relationship well-being.  Sexual activity.  Eating habits.  Work and work Statistician.  Method of birth control.  Menstrual cycle.  Pregnancy history. Screening  You may have the following tests or measurements:  Height, weight, and BMI.  Blood pressure.  Lipid and cholesterol levels. These may be checked every 5 years, or more frequently if you are over 52 years old.  Skin check.  Lung cancer screening. You may have this screening every year starting at age 71 if you have a 30-pack-year history of smoking and  currently smoke or have quit within the past 15 years.  Fecal occult blood test (FOBT) of the stool. You may have this test every year starting at age 22.  Flexible sigmoidoscopy or colonoscopy. You may have a sigmoidoscopy every 5 years or a colonoscopy every 10 years starting at age 27.  Hepatitis C blood test.  Hepatitis B blood test.  Sexually transmitted disease (STD) testing.  Diabetes screening. This is done by checking your blood sugar (glucose) after you have not eaten for a while (fasting). You may have this done every 1-3 years.  Mammogram. This may be done every 1-2 years. Talk to your health care provider about when you should start having regular mammograms. This may depend on whether you have a family history of breast cancer.  BRCA-related cancer screening. This may be done if you have a family history of breast, ovarian, tubal, or peritoneal cancers.  Pelvic exam and Pap test. This may be done every 3 years starting at age 83. Starting at age 15, this may be done every 5 years if you have a Pap test in combination with an HPV test.  Bone density scan. This is done to screen for osteoporosis. You may have this scan if you are at high risk for osteoporosis. Discuss your test results, treatment options, and if necessary, the need for more tests with your health care provider. Vaccines  Your health care provider may recommend certain vaccines, such as:  Influenza vaccine. This is recommended every year.  Tetanus, diphtheria, and acellular pertussis (Tdap, Td) vaccine. You may need a Td booster every 10  years.  Varicella vaccine. You may need this if you have not been vaccinated.  Zoster vaccine. You may need this after age 51.  Measles, mumps, and rubella (MMR) vaccine. You may need at least one dose of MMR if you were born in 1957 or later. You may also need a second dose.  Pneumococcal 13-valent conjugate (PCV13) vaccine. You may need this if you have certain  conditions and were not previously vaccinated.  Pneumococcal polysaccharide (PPSV23) vaccine. You may need one or two doses if you smoke cigarettes or if you have certain conditions.  Meningococcal vaccine. You may need this if you have certain conditions.  Hepatitis A vaccine. You may need this if you have certain conditions or if you travel or work in places where you may be exposed to hepatitis A.  Hepatitis B vaccine. You may need this if you have certain conditions or if you travel or work in places where you may be exposed to hepatitis B.  Haemophilus influenzae type b (Hib) vaccine. You may need this if you have certain conditions. Talk to your health care provider about which screenings and vaccines you need and how often you need them. This information is not intended to replace advice given to you by your health care provider. Make sure you discuss any questions you have with your health care provider. Document Released: 06/11/2015 Document Revised: 02/02/2016 Document Reviewed: 03/16/2015 Elsevier Interactive Patient Education  2017 [employer].

## 2016-07-11 NOTE — Progress Notes (Signed)
Patient ID: [name], female   DOB: Jan 16, 1956, 61 y.o.   MRN: [id number]   Subjective:    Patient ID: [name], female    DOB: [birth year], 61 y.o.   MRN: [id number]  Chief Complaint  Patient presents with  . Annual Exam  . Diabetes  . Hyperlipidemia    Diabetes  She presents for her follow-up diabetic visit. She has type 2 diabetes mellitus. Pertinent negatives for hypoglycemia include no headaches. Pertinent negatives for diabetes include no blurred vision and no chest pain. Risk factors for coronary artery disease include obesity and hypertension. She is compliant with treatment most of the time. She is following a diabetic diet.  Hyperlipidemia  This is a chronic problem. Exacerbating diseases include diabetes and obesity. Pertinent negatives include no chest pain or shortness of breath. Current antihyperlipidemic treatment includes diet change. The current treatment provides mild improvement of lipids. Risk factors for coronary artery disease include hypertension.    Patient is in today for annual examination. Patient has a Hx of Dm, hyperlipidemia, hypertension. Patient has a complaint of some memory concerns for the past 2 months. No additional concerns noted. She does note a high level of stress lately and anxiety. Notes some anhedonia but no suicidal ideation. Has noted some trouble sleeping as well. One of her sisters has been recently diagnosed with breast cancer and her other sister struggles with rheumatoid arthritis. Denies CP/palp/SOB/HA/congestion/fevers/GI or GU c/o. Taking meds as prescribed  I acted as a Education administrator for Penni Homans, [place], [place]   Past Medical History:  Diagnosis Date  . Allergic rhinitis   . Anemia 07/17/2016  . Anxiety   . Breast cancer ([place])   . Bronchitis   . Colon polyps   . Depression   . Diabetes mellitus    borderline but takes metformin  . Edema   . Family history of breast cancer in first degree relative   . GERD  (gastroesophageal reflux disease)   . Hyperparathyroidism   . Hypertension    controlled by medications  . Obesity   . Osteoarthritis    RA  . PONV (postoperative nausea and vomiting)    occasional  . Sleep apnea   . Viral meningitis   . Vitamin D deficiency     Past Surgical History:  Procedure Laterality Date  . ABDOMINAL HYSTERECTOMY  1998  . ADENOIDECTOMY    . BREAST SURGERY     Tran flap due to breast cancer  . CESAREAN SECTION  1988  . HAND SURGERY     dog bite, right hand  . HAND SURGERY     trauma, left hand  . KNEE ARTHROSCOPY     bilateral  . left knee replacement  2010  . MASTECTOMY  01/13/94   Right breast  . parathyroid resection    . PARATHYROIDECTOMY    . rectal abscess    . SEPTOPLASTY  1980  . SHOULDER ARTHROSCOPY Right 11/09/2015   Procedure: ARTHROSCOPY SHOULDER-acromioplasty, distal clavicle resection and debridement;  Surgeon: Melrose Nakayama, MD;  Location: [facility];  Service: Orthopedics;  Laterality: Right;  . TOE SURGERY Right    paronychia and adenoma removed  . TONSILLECTOMY    . TOTAL KNEE ARTHROPLASTY  06/18/08   [facility]  . TUMOR REMOVAL  1982   , scalp    Family History  Problem Relation Age of Onset  . Emphysema Father   . Lung cancer Father     lung ca dx 52  .  Other Father     prostate issues  . Breast cancer Maternal Aunt     dx late 48s  . Colon cancer Maternal Aunt     dx 29s  . Prostate cancer Maternal Grandfather 85    d. 85y  . Heart disease Paternal Grandfather   . Heart attack Paternal Grandfather     d. 50y  . Diabetes Paternal Grandfather   . Asthma Daughter     "seasonal"  . Arthritis Maternal Grandmother   . Aneurysm Maternal Grandmother     d. brain aneurysm at 77  . Arthritis Mother   . Multiple sclerosis Sister   . Breast cancer Sister 74    L IDC and DCIS; ER/PR+, Her2-  . Cancer Sister   . Fibrocystic breast disease Sister   . Arthritis/Rheumatoid Sister   . Cancer Maternal Uncle     d. mouth cancer  at younger age; smoker  . Other Paternal Uncle     muscle issues - couldn't walk or talk; d. 20y  . Cancer Maternal Aunt     lymphoma, dx 57s  . Ovarian cancer Maternal Aunt     dx 20s; d. late 42s  . Lung cancer Maternal Uncle     d. 41y; former smoker  . Throat cancer Cousin     maternal 1st cousin; smoker  . Breast cancer Cousin 25    maternal 1st cousin  . Other Paternal Uncle     prostate issues  . Breast cancer Cousin     paternal 1st cousin dx late 69s  . Throat cancer Cousin     maternal 1st cousin; used SL tobacco  . Prostate cancer Cousin     maternal 1st cousin dx 84s    Social History   Social History  . Marital status: Widowed    Spouse name: N/A  . Number of children: 1  . Years of education: N/A   Occupational History  . [place] History Main Topics  . Smoking status: Never Smoker  . Smokeless tobacco: Never Used  . Alcohol use Yes     Comment: occ.  . Drug use: No  . Sexual activity: Not on file   Other Topics Concern  . Not on file   Social History Narrative  . No narrative on file    Outpatient Medications Prior to Visit  Medication Sig Dispense Refill  . acetic acid-hydrocortisone (VOSOL-HC) otic solution Place 4 drops into the left ear 3 (three) times daily. 10 mL 0  . albuterol (VENTOLIN HFA) 108 (90 Base) MCG/ACT inhaler Inhale 2 puffs into the lungs every 6 (six) hours as needed for wheezing or shortness of breath. 18 g 1  . Calcium Carbonate (CALTRATE 600) 1500 MG TABS Take 1,500 mg by mouth daily.     . cetirizine (ZYRTEC) 10 MG tablet Take 1 tablet (10 mg total) by mouth 2 (two) times daily. 60 tablet 5  . Cholecalciferol (VITAMIN D PO) Take 1 tablet by mouth daily.    . Cinnamon 500 MG capsule Take 1,000 mg by mouth daily.    . clotrimazole (LOTRIMIN) 1 % external solution 4 drops in left ear 4 times daily 7 days 30 mL 0  . desonide (DESOWEN) 0.05 % cream Apply twice a day if needed to red itchy areas. 45  g 3  . diphenhydrAMINE (BENADRYL) 25 MG tablet Take 1 tablet (25 mg total) by mouth every 8 (eight) hours as needed for itching,  allergies or sleep. 30 tablet 0  . EPINEPHrine 0.3 mg/0.3 mL IJ SOAJ injection Inject 0.3 mLs (0.3 mg total) into the muscle once as needed (Throat or facial swelling or difficulty breathing). 1 Device 0  . esomeprazole (NEXIUM) 40 MG capsule Take 1 capsule (40 mg total) by mouth daily as needed. TAKE 1 CAPSULE (40 MG TOTAL) BY MOUTH DAILY. 90 capsule 1  . famotidine (PEPCID) 20 MG tablet Take 1 tablet (20 mg total) by mouth 2 (two) times daily. 60 tablet 5  . Ferrous Fumarate-Folic Acid (HEMOCYTE-F) 324-1 MG TABS Take 1 tablet by mouth daily. 30 each 6  . ferrous sulfate 325 (65 FE) MG EC tablet Take 325 mg by mouth daily.    . fluticasone (FLONASE) 50 MCG/ACT nasal spray Place 2 sprays into both nostrils daily. (Patient taking differently: Place 2 sprays into both nostrils daily as needed for allergies. ) 16 g 1  . furosemide (LASIX) 40 MG tablet Take 1 tablet (40 mg total) by mouth 2 (two) times daily. (Patient taking differently: Take 40 mg by mouth daily. ) 60 tablet 3  . Ginger 500 MG CAPS Take 500 mg by mouth daily.     . Melatonin 5 MG TABS Take 5 mg by mouth at bedtime as needed (For sleep.).    Marland Kitchen metFORMIN (GLUCOPHAGE) 500 MG tablet TAKE 1 TABLET (500 MG TOTAL) BY MOUTH 2 (TWO) TIMES DAILY WITH A MEAL. 180 tablet 0  . montelukast (SINGULAIR) 10 MG tablet Take 1 tablet (10 mg total) by mouth at bedtime. 30 tablet 5  . Multiple Vitamins-Minerals (MULTIVITAMIN & MINERAL PO) Take 1 tablet by mouth daily.    Marland Kitchen ofloxacin (FLOXIN OTIC) 0.3 % otic solution Place 5 drops into the left ear 2 (two) times daily. 5 mL 0  . predniSONE (DELTASONE) 20 MG tablet Day 1: 68m Day 2: 471mDay 3: 4044may 4: 3m5my 5: 3mg44m 6:10mg 4m7:10mg 112mblet 0  . ranitidine (ZANTAC) 150 MG tablet Take 1 tablet (150 mg total) by mouth 2 (two) times daily. 60 tablet 5  .  sulfamethoxazole-trimethoprim (BACTRIM DS,SEPTRA DS) 800-160 MG tablet Take 1 tablet by mouth 2 (two) times daily. 20 tablet 0  . telmisartan-hydrochlorothiazide (MICARDIS HCT) 80-25 MG tablet TAKE ONE TABLET BY MOUTH ONCE DAILY 90 tablet 1  . Turmeric 500 MG CAPS Take 500 mg by mouth daily.      No facility-administered medications prior to visit.     Allergies  Allergen Reactions  . Mucinex [Guaifenesin Er] Shortness Of Breath  . Penicillins Hives and Other (See Comments)    Has patient had a PCN reaction causing immediate rash, facial/tongue/throat swelling, SOB or lightheadedness with hypotension: no Has patient had a PCN reaction causing severe rash involving mucus membranes or skin necrosis: no Has patient had a PCN reaction that required hospitalization no Has patient had a PCN reaction occurring within the last 10 years: no If all of the above answers are "NO", then may proceed with Cephalosporin use.   . Prozac [Fluoxetine Hcl] Hives  . Lexapro [Escitalopram Oxalate]     Nausea and hypersalivation.     Review of Systems  Constitutional: Negative for fever and malaise/fatigue.  HENT: Negative for congestion.   Eyes: Negative for blurred vision.  Respiratory: Negative for cough and shortness of breath.   Cardiovascular: Negative for chest pain, palpitations and leg swelling.  Gastrointestinal: Negative for vomiting.  Musculoskeletal: Negative for back pain.  Skin: Negative for rash.  Neurological: Negative for loss of consciousness and headaches.  Psychiatric/Behavioral: Positive for depression and memory loss. The patient has insomnia.        Objective:    Physical Exam  Constitutional: She is oriented to person, place, and time. She appears well-developed and well-nourished. No distress.  HENT:  Head: Normocephalic and atraumatic.  Eyes: Conjunctivae are normal.  Neck: Normal range of motion. No thyromegaly present.  Cardiovascular: Normal rate and regular rhythm.    Pulmonary/Chest: Effort normal and breath sounds normal. She has no wheezes.  Abdominal: Soft. Bowel sounds are normal. There is no tenderness.  Musculoskeletal: She exhibits no edema or deformity.  Neurological: She is alert and oriented to person, place, and time.  Skin: Skin is warm and dry. She is not diaphoretic.  Psychiatric: She has a normal mood and affect.    BP 112/66 (BP Location: Left Wrist, Patient Position: Sitting, Cuff Size: Large)   Pulse 90   Temp 98.4 F (36.9 C) (Oral)   Ht '5\' 7"'  (1.702 m)   Wt 297 lb 12.8 oz (135.1 kg)   SpO2 98% Comment: RA  BMI 46.64 kg/m  Wt Readings from Last 3 Encounters:  07/11/16 297 lb 12.8 oz (135.1 kg)  04/28/16 (!) 302 lb 4 oz (137.1 kg)  03/27/16 (!) 303 lb 9.6 oz (137.7 kg)     Lab Results  Component Value Date   WBC 7.4 05/19/2016   HGB 11.5 (L) 05/19/2016   HCT 34.7 (L) 05/19/2016   PLT 281.0 05/19/2016   GLUCOSE 98 04/28/2016   CHOL 162 04/28/2016   TRIG 239.0 (H) 04/28/2016   HDL 50.10 04/28/2016   LDLDIRECT 92.0 04/28/2016   LDLCALC 99 01/21/2015   ALT 23 04/28/2016   AST 14 04/28/2016   NA 138 04/28/2016   K 4.0 04/28/2016   CL 98 04/28/2016   CREATININE 0.97 04/28/2016   BUN 32 (H) 04/28/2016   CO2 31 04/28/2016   TSH 1.26 04/28/2016   INR 2.0 (H) 06/21/2008   HGBA1C 6.1 11/18/2015    Lab Results  Component Value Date   TSH 1.26 04/28/2016   Lab Results  Component Value Date   WBC 7.4 05/19/2016   HGB 11.5 (L) 05/19/2016   HCT 34.7 (L) 05/19/2016   MCV 83.3 05/19/2016   PLT 281.0 05/19/2016   Lab Results  Component Value Date   NA 138 04/28/2016   K 4.0 04/28/2016   CO2 31 04/28/2016   GLUCOSE 98 04/28/2016   BUN 32 (H) 04/28/2016   CREATININE 0.97 04/28/2016   BILITOT 0.4 04/28/2016   ALKPHOS 67 04/28/2016   AST 14 04/28/2016   ALT 23 04/28/2016   PROT 7.0 04/28/2016   ALBUMIN 3.9 04/28/2016   CALCIUM 9.5 04/28/2016   ANIONGAP 11 11/09/2015   GFR 61.90 04/28/2016   Lab Results    Component Value Date   CHOL 162 04/28/2016   Lab Results  Component Value Date   HDL 50.10 04/28/2016   Lab Results  Component Value Date   LDLCALC 99 01/21/2015   Lab Results  Component Value Date   TRIG 239.0 (H) 04/28/2016   Lab Results  Component Value Date   CHOLHDL 3 04/28/2016   Lab Results  Component Value Date   HGBA1C 6.1 11/18/2015       Assessment & Plan:   Problem List Items Addressed This Visit    Depression with anxiety    Has been having trouble with stress and memory. Agrees to start  Sertraline 25 mg daily. Report any concerns.      Essential hypertension    Well controlled, no changes to meds. Encouraged heart healthy diet such as the DASH diet and exercise as tolerated.       Insomnia    Encouraged good sleep hygiene such as dark, quiet room. No blue/green glowing lights such as computer screens in bedroom. No alcohol or stimulants in evening. Cut down on caffeine as able. Regular exercise is helpful but not just prior to bed time.       Obesity    Encouraged DASH diet, decrease po intake and increase exercise as tolerated. Needs 7-8 hours of sleep nightly. Avoid trans fats, eat small, frequent meals every 4-5 hours with lean proteins, complex carbs and healthy fats. Minimize simple carbs      Diabetes mellitus, type 2 (HCC)    hgba1c acceptable, minimize simple carbs. Increase exercise as tolerated. Continue current meds      Preventative health care    Patient encouraged to maintain heart healthy diet, regular exercise, adequate sleep. Consider daily probiotics. Take medications as prescribed      Anemia    Increase leafy greens, consider increased lean red meat and using cast iron cookware. Continue to monitor, report any concerns         I am having Ms. Crain start on sertraline. I am also having her maintain her calcium carbonate, ferrous sulfate, Cinnamon, Multiple Vitamins-Minerals (MULTIVITAMIN & MINERAL PO), Ginger, Turmeric,  fluticasone, Melatonin, Cholecalciferol (VITAMIN D PO), cetirizine, esomeprazole, ranitidine, diphenhydrAMINE, clotrimazole, EPINEPHrine, acetic acid-hydrocortisone, ofloxacin, furosemide, telmisartan-hydrochlorothiazide, predniSONE, famotidine, desonide, montelukast, sulfamethoxazole-trimethoprim, Ferrous Fumarate-Folic Acid, metFORMIN, and albuterol.  Meds ordered this encounter  Medications  . sertraline (ZOLOFT) 25 MG tablet    Sig: Take 1 tablet (25 mg total) by mouth daily.    Dispense:  30 tablet    Refill:  1    CMA served as scribe during this visit. History, Physical and Plan performed by medical provider. Documentation and orders reviewed and attested to.  Penni Homans, MD

## 2016-07-11 NOTE — Assessment & Plan Note (Signed)
Taking daily supplements 

## 2016-07-13 ENCOUNTER — Encounter: Payer: Self-pay | Admitting: Internal Medicine

## 2016-07-17 ENCOUNTER — Encounter: Payer: Self-pay | Admitting: Internal Medicine

## 2016-07-17 ENCOUNTER — Encounter: Payer: Self-pay | Admitting: Family Medicine

## 2016-07-17 DIAGNOSIS — D649 Anemia, unspecified: Secondary | ICD-10-CM | POA: Insufficient documentation

## 2016-07-17 HISTORY — DX: Anemia, unspecified: D64.9

## 2016-07-17 NOTE — Assessment & Plan Note (Signed)
Encouraged good sleep hygiene such as dark, quiet room. No blue/green glowing lights such as computer screens in bedroom. No alcohol or stimulants in evening. Cut down on caffeine as able. Regular exercise is helpful but not just prior to bed time.  

## 2016-07-17 NOTE — Assessment & Plan Note (Signed)
Has been having trouble with stress and memory. Agrees to start Sertraline 25 mg daily. Report any concerns.

## 2016-07-17 NOTE — Assessment & Plan Note (Signed)
Increase leafy greens, consider increased lean red meat and using cast iron cookware. Continue to monitor, report any concerns 

## 2016-07-17 NOTE — Assessment & Plan Note (Signed)
hgba1c acceptable, minimize simple carbs. Increase exercise as tolerated. Continue current meds 

## 2016-07-17 NOTE — Assessment & Plan Note (Signed)
Patient encouraged to maintain heart healthy diet, regular exercise, adequate sleep. Consider daily probiotics. Take medications as prescribed 

## 2016-07-17 NOTE — Assessment & Plan Note (Signed)
Encouraged DASH diet, decrease po intake and increase exercise as tolerated. Needs 7-8 hours of sleep nightly. Avoid trans fats, eat small, frequent meals every 4-5 hours with lean proteins, complex carbs and healthy fats. Minimize simple carbs 

## 2016-07-17 NOTE — Assessment & Plan Note (Signed)
Well controlled, no changes to meds. Encouraged heart healthy diet such as the DASH diet and exercise as tolerated.  °

## 2016-08-21 ENCOUNTER — Ambulatory Visit (INDEPENDENT_AMBULATORY_CARE_PROVIDER_SITE_OTHER): Payer: BLUE CROSS/BLUE SHIELD | Admitting: Family Medicine

## 2016-08-21 ENCOUNTER — Encounter: Payer: Self-pay | Admitting: Family Medicine

## 2016-08-21 VITALS — BP 118/74 | HR 92 | Temp 98.8°F | Wt 295.2 lb

## 2016-08-21 DIAGNOSIS — E559 Vitamin D deficiency, unspecified: Secondary | ICD-10-CM | POA: Diagnosis not present

## 2016-08-21 DIAGNOSIS — J45901 Unspecified asthma with (acute) exacerbation: Secondary | ICD-10-CM | POA: Diagnosis not present

## 2016-08-21 DIAGNOSIS — I10 Essential (primary) hypertension: Secondary | ICD-10-CM | POA: Diagnosis not present

## 2016-08-21 DIAGNOSIS — E782 Mixed hyperlipidemia: Secondary | ICD-10-CM | POA: Diagnosis not present

## 2016-08-21 DIAGNOSIS — D649 Anemia, unspecified: Secondary | ICD-10-CM | POA: Diagnosis not present

## 2016-08-21 DIAGNOSIS — E785 Hyperlipidemia, unspecified: Secondary | ICD-10-CM | POA: Insufficient documentation

## 2016-08-21 DIAGNOSIS — Z1239 Encounter for other screening for malignant neoplasm of breast: Secondary | ICD-10-CM

## 2016-08-21 DIAGNOSIS — J4530 Mild persistent asthma, uncomplicated: Secondary | ICD-10-CM

## 2016-08-21 DIAGNOSIS — J0100 Acute maxillary sinusitis, unspecified: Secondary | ICD-10-CM | POA: Diagnosis not present

## 2016-08-21 DIAGNOSIS — E6609 Other obesity due to excess calories: Secondary | ICD-10-CM

## 2016-08-21 DIAGNOSIS — Z1231 Encounter for screening mammogram for malignant neoplasm of breast: Secondary | ICD-10-CM

## 2016-08-21 HISTORY — DX: Hyperlipidemia, unspecified: E78.5

## 2016-08-21 LAB — LDL CHOLESTEROL, DIRECT: Direct LDL: 110 mg/dL

## 2016-08-21 LAB — COMPREHENSIVE METABOLIC PANEL
ALT: 16 U/L (ref 0–35)
AST: 17 U/L (ref 0–37)
Albumin: 4.3 g/dL (ref 3.5–5.2)
Alkaline Phosphatase: 69 U/L (ref 39–117)
BUN: 23 mg/dL (ref 6–23)
CO2: 31 mEq/L (ref 19–32)
Calcium: 9.9 mg/dL (ref 8.4–10.5)
Chloride: 96 mEq/L (ref 96–112)
Creatinine, Ser: 1.04 mg/dL (ref 0.40–1.20)
GFR: 57.05 mL/min — ABNORMAL LOW (ref >60.00)
Glucose, Bld: 106 mg/dL — ABNORMAL HIGH (ref 70–99)
Potassium: 3.7 mEq/L (ref 3.5–5.1)
Sodium: 138 mEq/L (ref 135–145)
Total Bilirubin: 0.4 mg/dL (ref 0.2–1.2)
Total Protein: 7.7 g/dL (ref 6.0–8.3)

## 2016-08-21 LAB — LIPID PANEL
Cholesterol: 174 mg/dL (ref 0–200)
HDL: 40.2 mg/dL (ref >39.00)
NonHDL: 133.66
Total CHOL/HDL Ratio: 4
Triglycerides: 251 mg/dL — ABNORMAL HIGH (ref 0.0–149.0)
VLDL: 50.2 mg/dL — ABNORMAL HIGH (ref 0.0–40.0)

## 2016-08-21 LAB — T4, FREE: Free T4: 0.79 ng/dL (ref 0.60–1.60)

## 2016-08-21 LAB — TSH: TSH: 1.01 u[IU]/mL (ref 0.35–4.50)

## 2016-08-21 MED ORDER — HYDROCODONE-HOMATROPINE 5-1.5 MG/5ML PO SYRP: 5.0000 mL | ORAL_SOLUTION | Freq: Three times a day (TID) | ORAL | 0 refills | Status: DC | PRN

## 2016-08-21 MED ORDER — CEFDINIR 300 MG PO CAPS: 300.0000 mg | ORAL_CAPSULE | Freq: Two times a day (BID) | ORAL | 0 refills | Status: DC

## 2016-08-21 MED ORDER — SERTRALINE HCL 25 MG PO TABS: 25.0000 mg | ORAL_TABLET | Freq: Every day | ORAL | 0 refills | Status: DC

## 2016-08-21 NOTE — Progress Notes (Signed)
Pre visit review using our clinic review tool, if applicable. No additional management support is needed unless otherwise documented below in the visit note. 

## 2016-08-21 NOTE — Assessment & Plan Note (Addendum)
Increase leafy greens, consider increased lean red meat and using cast iron cookware. Continue to monitor, report any concerns. Check cbc today 

## 2016-08-21 NOTE — Assessment & Plan Note (Signed)
Check level today. No changes

## 2016-08-21 NOTE — Assessment & Plan Note (Signed)
Well controlled, no changes to meds. Encouraged heart healthy diet such as the DASH diet and exercise as tolerated.  °

## 2016-08-21 NOTE — Assessment & Plan Note (Signed)
Encouraged heart healthy diet, increase exercise, avoid trans fats, consider a krill oil cap daily 

## 2016-08-21 NOTE — Patient Instructions (Addendum)
[place] twice daily Aged [employer] Garlic twice daily Vitamin C 500-1000mg  daily 64 ounces of water Probiotic "NOW"   Anemia, Nonspecific Anemia is a condition in which the concentration of red blood cells or hemoglobin in the blood is below normal. Hemoglobin is a substance in red blood cells that carries oxygen to the tissues of the body. Anemia results in not enough oxygen reaching these tissues. What are the causes? Common causes of anemia include:  Excessive bleeding. Bleeding may be internal or external. This includes excessive bleeding from periods (in women) or from the intestine.  Poor nutrition.  Chronic kidney, thyroid, and liver disease.  Bone marrow disorders that decrease red blood cell production.  Cancer and treatments for cancer.  HIV, AIDS, and their treatments.  Spleen problems that increase red blood cell destruction.  Blood disorders.  Excess destruction of red blood cells due to infection, medicines, and autoimmune disorders. What are the signs or symptoms?  Minor weakness.  Dizziness.  Headache.  Palpitations.  Shortness of breath, especially with exercise.  Paleness.  Cold sensitivity.  Indigestion.  Nausea.  Difficulty sleeping.  Difficulty concentrating. Symptoms may occur suddenly or they may develop slowly. How is this diagnosed? Additional blood tests are often needed. These help your health care provider determine the best treatment. Your health care provider will check your stool for blood and look for other causes of blood loss. How is this treated? Treatment varies depending on the cause of the anemia. Treatment can include:  Supplements of iron, vitamin Y10, or folic acid.  Hormone medicines.  A blood transfusion. This may be needed if blood loss is severe.  Hospitalization. This may be needed if there is significant continual blood loss.  Dietary changes.  Spleen removal. Follow these instructions at  home: Keep all follow-up appointments. It often takes many weeks to correct anemia, and having your health care provider check on your condition and your response to treatment is very important. Get help right away if:  You develop extreme weakness, shortness of breath, or chest pain.  You become dizzy or have trouble concentrating.  You develop heavy vaginal bleeding.  You develop a rash.  You have bloody or black, tarry stools.  You faint.  You vomit up blood.  You vomit repeatedly.  You have abdominal pain.  You have a fever or persistent symptoms for more than 2-3 days.  You have a fever and your symptoms suddenly get worse.  You are dehydrated. This information is not intended to replace advice given to you by your health care provider. Make sure you discuss any questions you have with your health care provider. Document Released: 06/22/2004 Document Revised: 10/27/2015 Document Reviewed: 11/08/2012 Elsevier Interactive Patient Education  2017 [employer].

## 2016-08-21 NOTE — Progress Notes (Signed)
Subjective:  I acted as a Education administrator for Dr. Charlett Blake. [place], [place]   Patient ID: [name], female    DOB: [birth year], 61 y.o.   MRN: [id number]  Chief Complaint  Patient presents with  . Follow-up    Medication changes    HPI  Patient is in today for a 6 week follow up. Patient c/o sinus congestion and pain, with draignage. Patient also following up on her Zoloft, patient states she is doing well on the Zoloft, she will continue with the current dose of Zoloft, and call if any thing changes. She denies fevers or chills but does acknowledge facial pressue, throat irritation and fatigue. Denies CP/palp/SOB/fevers/GI or GU c/o. Taking meds as prescribed  Patient Care Team: Mosie Lukes, MD as PCP - General (Family Medicine)   Past Medical History:  Diagnosis Date  . Allergic rhinitis   . Allergy   . Anemia 07/17/2016  . Anxiety   . Asthma    seasonal  . Blood transfusion without reported diagnosis   . Breast cancer ([place])    right  . Bronchitis   . Colon polyps   . Depression   . Diabetes mellitus    borderline but takes metformin  . Edema   . Family history of breast cancer in first degree relative   . GERD (gastroesophageal reflux disease)   . Hyperlipidemia 08/21/2016   no meds  . Hyperparathyroidism   . Hypertension    controlled by medications  . Neuromuscular disorder ([facility])    Fibromyalgia  . Obesity   . Osteoarthritis    RA  . PONV (postoperative nausea and vomiting)    occasional  . Sleep apnea    wears cpap  . Viral meningitis   . Vitamin D deficiency     Past Surgical History:  Procedure Laterality Date  . ABDOMINAL HYSTERECTOMY  1998  . ADENOIDECTOMY    . BREAST SURGERY     Tran flap due to breast cancer  . CESAREAN SECTION  1988  . COLONOSCOPY  08/14/2011  . HAND SURGERY     dog bite, right hand  . HAND SURGERY     trauma, left hand  . KNEE ARTHROSCOPY     bilateral  . left knee replacement  2010  . MASTECTOMY  01/13/94   Right breast    . parathyroid resection    . PARATHYROIDECTOMY    . POLYPECTOMY    . rectal abscess    . SEPTOPLASTY  1980  . SHOULDER ARTHROSCOPY Right 11/09/2015   Procedure: ARTHROSCOPY SHOULDER-acromioplasty, distal clavicle resection and debridement;  Surgeon: Melrose Nakayama, MD;  Location: [facility];  Service: Orthopedics;  Laterality: Right;  . shoulder arthroscopy     rotator cuff repair  . TOE SURGERY Right    paronychia and adenoma removed  . TONSILLECTOMY    . TOTAL KNEE ARTHROPLASTY  06/18/08   [facility]  . TUMOR REMOVAL  1982   , scalp    Family History  Problem Relation Age of Onset  . Emphysema Father   . Lung cancer Father     lung ca dx 49  . Other Father     prostate issues  . Breast cancer Maternal Aunt     dx late 12s  . Colon cancer Maternal Aunt     dx 18s  . Colon polyps Maternal Aunt   . Prostate cancer Maternal Grandfather 85    d. 85y  . Heart disease Paternal Grandfather   .  Heart attack Paternal Grandfather     d. 50y  . Diabetes Paternal Grandfather   . Asthma Daughter     "seasonal"  . Arthritis Maternal Grandmother   . Aneurysm Maternal Grandmother     d. brain aneurysm at 58  . Arthritis Mother   . Colon polyps Mother   . Multiple sclerosis Sister   . Breast cancer Sister 26    L IDC and DCIS; ER/PR+, Her2-  . Fibrocystic breast disease Sister   . Arthritis/Rheumatoid Sister   . Cancer Maternal Uncle     d. mouth cancer at younger age; smoker  . Other Paternal Uncle     muscle issues - couldn't walk or talk; d. 20y  . Cancer Maternal Aunt     lymphoma, dx 82s  . Ovarian cancer Maternal Aunt     dx 65s; d. late 24s  . Lung cancer Maternal Uncle     d. 57y; former smoker  . Throat cancer Cousin     maternal 1st cousin; smoker  . Breast cancer Cousin 36    maternal 1st cousin  . Other Paternal Uncle     prostate issues  . Breast cancer Cousin     paternal 1st cousin dx late 18s  . Throat cancer Cousin     maternal 1st cousin; used SL tobacco   . Prostate cancer Cousin     maternal 1st cousin dx 51s  . Esophageal cancer Neg Hx   . Rectal cancer Neg Hx   . Stomach cancer Neg Hx     Social History   Social History  . Marital status: Widowed    Spouse name: N/A  . Number of children: 1  . Years of education: N/A   Occupational History  . [place] History Main Topics  . Smoking status: Never Smoker  . Smokeless tobacco: Never Used  . Alcohol use Yes     Comment: occ.  . Drug use: No  . Sexual activity: Not on file   Other Topics Concern  . Not on file   Social History Narrative  . No narrative on file    Outpatient Medications Prior to Visit  Medication Sig Dispense Refill  . albuterol (VENTOLIN HFA) 108 (90 Base) MCG/ACT inhaler Inhale 2 puffs into the lungs every 6 (six) hours as needed for wheezing or shortness of breath. 18 g 1  . Calcium Carbonate (CALTRATE 600) 1500 MG TABS Take 1,500 mg by mouth daily.     . cetirizine (ZYRTEC) 10 MG tablet Take 1 tablet (10 mg total) by mouth 2 (two) times daily. 60 tablet 5  . Cholecalciferol (VITAMIN D PO) Take 1 tablet by mouth daily.    . Cinnamon 500 MG capsule Take 1,000 mg by mouth daily.    Marland Kitchen EPINEPHrine 0.3 mg/0.3 mL IJ SOAJ injection Inject 0.3 mLs (0.3 mg total) into the muscle once as needed (Throat or facial swelling or difficulty breathing). 1 Device 0  . Ferrous Fumarate-Folic Acid (HEMOCYTE-F) 324-1 MG TABS Take 1 tablet by mouth daily. 30 each 6  . fluticasone (FLONASE) 50 MCG/ACT nasal spray Place 2 sprays into both nostrils daily. (Patient taking differently: Place 2 sprays into both nostrils daily as needed for allergies. ) 16 g 1  . Ginger 500 MG CAPS Take 500 mg by mouth daily.     . Melatonin 5 MG TABS Take 5 mg by mouth at bedtime as needed (For sleep.).    Marland Kitchen  metFORMIN (GLUCOPHAGE) 500 MG tablet TAKE 1 TABLET (500 MG TOTAL) BY MOUTH 2 (TWO) TIMES DAILY WITH A MEAL. 180 tablet 0  . montelukast (SINGULAIR) 10 MG tablet Take  1 tablet (10 mg total) by mouth at bedtime. 30 tablet 5  . Multiple Vitamins-Minerals (MULTIVITAMIN & MINERAL PO) Take 1 tablet by mouth daily.    . ranitidine (ZANTAC) 150 MG tablet Take 1 tablet (150 mg total) by mouth 2 (two) times daily. 60 tablet 5  . telmisartan-hydrochlorothiazide (MICARDIS HCT) 80-25 MG tablet TAKE ONE TABLET BY MOUTH ONCE DAILY 90 tablet 1  . Turmeric 500 MG CAPS Take 500 mg by mouth daily.     Marland Kitchen acetic acid-hydrocortisone (VOSOL-HC) otic solution Place 4 drops into the left ear 3 (three) times daily. 10 mL 0  . diphenhydrAMINE (BENADRYL) 25 MG tablet Take 1 tablet (25 mg total) by mouth every 8 (eight) hours as needed for itching, allergies or sleep. 30 tablet 0  . esomeprazole (NEXIUM) 40 MG capsule Take 1 capsule (40 mg total) by mouth daily as needed. TAKE 1 CAPSULE (40 MG TOTAL) BY MOUTH DAILY. 90 capsule 1  . famotidine (PEPCID) 20 MG tablet Take 1 tablet (20 mg total) by mouth 2 (two) times daily. 60 tablet 5  . ferrous sulfate 325 (65 FE) MG EC tablet Take 325 mg by mouth daily.    . furosemide (LASIX) 40 MG tablet Take 1 tablet (40 mg total) by mouth 2 (two) times daily. (Patient taking differently: Take 40 mg by mouth daily. ) 60 tablet 3  . sertraline (ZOLOFT) 25 MG tablet Take 1 tablet (25 mg total) by mouth daily. 30 tablet 1  . clotrimazole (LOTRIMIN) 1 % external solution 4 drops in left ear 4 times daily 7 days 30 mL 0  . desonide (DESOWEN) 0.05 % cream Apply twice a day if needed to red itchy areas. 45 g 3  . ofloxacin (FLOXIN OTIC) 0.3 % otic solution Place 5 drops into the left ear 2 (two) times daily. 5 mL 0  . predniSONE (DELTASONE) 20 MG tablet Day 1: 25m Day 2: 455mDay 3: 4011may 4: 39m55my 5: 39mg86m 6:10mg 71m7:10mg 180mblet 0  . sulfamethoxazole-trimethoprim (BACTRIM DS,SEPTRA DS) 800-160 MG tablet Take 1 tablet by mouth 2 (two) times daily. 20 tablet 0   No facility-administered medications prior to visit.     Allergies    Allergen Reactions  . Mucinex [Guaifenesin Er] Shortness Of Breath  . Penicillins Hives and Other (See Comments)    Has patient had a PCN reaction causing immediate rash, facial/tongue/throat swelling, SOB or lightheadedness with hypotension: no Has patient had a PCN reaction causing severe rash involving mucus membranes or skin necrosis: no Has patient had a PCN reaction that required hospitalization no Has patient had a PCN reaction occurring within the last 10 years: no If all of the above answers are "NO", then may proceed with Cephalosporin use.   . Prozac [Fluoxetine Hcl] Hives  . Lexapro [Escitalopram Oxalate]     Nausea and hypersalivation.     Review of Systems  Constitutional: Positive for chills and malaise/fatigue. Negative for fever.  HENT: Positive for congestion, ear pain and sinus pain.   Eyes: Negative for blurred vision.  Respiratory: Positive for cough and sputum production. Negative for shortness of breath.   Cardiovascular: Negative for chest pain, palpitations and leg swelling.  Gastrointestinal: Negative for vomiting.  Musculoskeletal: Negative for back pain.  Skin: Negative for rash.  Neurological: Negative for loss of consciousness and headaches.       Objective:    Physical Exam  Constitutional: She is oriented to person, place, and time. She appears well-developed and well-nourished. No distress.  HENT:  Head: Normocephalic and atraumatic.  Nasal mucosa is boggy and erythematous.   Eyes: Conjunctivae are normal.  Neck: Normal range of motion. No thyromegaly present.  Cardiovascular: Normal rate and regular rhythm.   Pulmonary/Chest: Effort normal and breath sounds normal. She has no wheezes.  Abdominal: Soft. Bowel sounds are normal. There is no tenderness.  Musculoskeletal: Normal range of motion. She exhibits no edema or deformity.  Neurological: She is alert and oriented to person, place, and time.  Skin: Skin is warm and dry. She is not  diaphoretic.  Psychiatric: She has a normal mood and affect.    BP 118/74 (BP Location: Left Arm, Patient Position: Sitting, Cuff Size: Normal)   Pulse 92   Temp 98.8 F (37.1 C) (Oral)   Wt 295 lb 3.2 oz (133.9 kg)   SpO2 97%   BMI 46.23 kg/m  Wt Readings from Last 3 Encounters:  08/23/16 295 lb (133.8 kg)  08/21/16 295 lb 3.2 oz (133.9 kg)  07/11/16 297 lb 12.8 oz (135.1 kg)   BP Readings from Last 3 Encounters:  08/21/16 118/74  07/11/16 112/66  04/28/16 102/68     Immunization History  Administered Date(s) Administered  . Influenza Split 03/27/2011, 03/29/2012  . Influenza Whole 04/07/2009, 03/28/2010  . Influenza,inj,Quad PF,36+ Mos 02/26/2013, 01/28/2016  . Pneumococcal Polysaccharide-23 04/03/2014  . Td 04/29/2009  . Zoster 01/28/2016    Health Maintenance  Topic Date Due  . Hepatitis C Screening  February 07, 1956  . HIV Screening  07/23/1969  . HEMOGLOBIN A1C  05/19/2016  . OPHTHALMOLOGY EXAM  06/13/2016  . COLONOSCOPY  08/13/2016  . MAMMOGRAM  09/12/2016  . PAP SMEAR  03/31/2017  . FOOT EXAM  07/11/2017  . PNEUMOCOCCAL POLYSACCHARIDE VACCINE (2) 04/04/2019  . TETANUS/TDAP  04/30/2019  . INFLUENZA VACCINE  Completed    Lab Results  Component Value Date   WBC 7.4 05/19/2016   HGB 11.5 (L) 05/19/2016   HCT 34.7 (L) 05/19/2016   PLT 281.0 05/19/2016   GLUCOSE 106 (H) 08/21/2016   CHOL 174 08/21/2016   TRIG 251.0 (H) 08/21/2016   HDL 40.20 08/21/2016   LDLDIRECT 110.0 08/21/2016   LDLCALC 99 01/21/2015   ALT 16 08/21/2016   AST 17 08/21/2016   NA 138 08/21/2016   K 3.7 08/21/2016   CL 96 08/21/2016   CREATININE 1.04 08/21/2016   BUN 23 08/21/2016   CO2 31 08/21/2016   TSH 1.01 08/21/2016   INR 2.0 (H) 06/21/2008   HGBA1C 6.1 11/18/2015    Lab Results  Component Value Date   TSH 1.01 08/21/2016   Lab Results  Component Value Date   WBC 7.4 05/19/2016   HGB 11.5 (L) 05/19/2016   HCT 34.7 (L) 05/19/2016   MCV 83.3 05/19/2016   PLT 281.0  05/19/2016   Lab Results  Component Value Date   NA 138 08/21/2016   K 3.7 08/21/2016   CO2 31 08/21/2016   GLUCOSE 106 (H) 08/21/2016   BUN 23 08/21/2016   CREATININE 1.04 08/21/2016   BILITOT 0.4 08/21/2016   ALKPHOS 69 08/21/2016   AST 17 08/21/2016   ALT 16 08/21/2016   PROT 7.7 08/21/2016   ALBUMIN 4.3 08/21/2016   CALCIUM 9.9 08/21/2016   ANIONGAP 11 11/09/2015   GFR  57.05 (L) 08/21/2016   Lab Results  Component Value Date   CHOL 174 08/21/2016   Lab Results  Component Value Date   HDL 40.20 08/21/2016   Lab Results  Component Value Date   LDLCALC 99 01/21/2015   Lab Results  Component Value Date   TRIG 251.0 (H) 08/21/2016   Lab Results  Component Value Date   CHOLHDL 4 08/21/2016   Lab Results  Component Value Date   HGBA1C 6.1 11/18/2015         Assessment & Plan:   Problem List Items Addressed This Visit    Vitamin D deficiency    Check level today. No changes      Relevant Orders   Vitamin D 1,25 dihydroxy (Completed)   Essential hypertension - Primary    Well controlled, no changes to meds. Encouraged heart healthy diet such as the DASH diet and exercise as tolerated.       Relevant Medications   furosemide (LASIX) 40 MG tablet   Other Relevant Orders   T4, free (Completed)   TSH (Completed)   Comprehensive metabolic panel (Completed)   Obesity    Encouraged DASH diet, decrease po intake and increase exercise as tolerated. Needs 7-8 hours of sleep nightly. Avoid trans fats, eat small, frequent meals every 4-5 hours with lean proteins, complex carbs and healthy fats. Minimize simple carbs, referred to bariatic program       Sinusitis    Flared for last several days. Encouraged increased rest and hydration, add probiotics, zinc such as Coldeze or Xicam. Treat fevers as needed, Elderberry, aged garlic and vitamin c. Allowed hydromet for cough qhs and Omnicef if symptoms worsen      Relevant Medications   HYDROcodone-homatropine  (HYDROMET) 5-1.5 MG/5ML syrup   cefdinir (OMNICEF) 300 MG capsule   Asthma with acute exacerbation   Relevant Orders   CBC with Differential/Platelet   Mild persistent asthma without complication    Albuterol HFA is helpful as needed. Encouraged to use proactively for next 3 days and then as needed.       Anemia    Increase leafy greens, consider increased lean red meat and using cast iron cookware. Continue to monitor, report any concerns. Check cbc today      Hyperlipidemia    Encouraged heart healthy diet, increase exercise, avoid trans fats, consider a krill oil cap daily      Relevant Medications   furosemide (LASIX) 40 MG tablet   Other Relevant Orders   Lipid panel (Completed)    Other Visit Diagnoses    Breast cancer screening          I have discontinued Ms. Chlebowski's diphenhydrAMINE, clotrimazole, ofloxacin, predniSONE, desonide, and sulfamethoxazole-trimethoprim. I am also having her start on HYDROcodone-homatropine and cefdinir. Additionally, I am having her maintain her calcium carbonate, Cinnamon, Multiple Vitamins-Minerals (MULTIVITAMIN & MINERAL PO), Ginger, Turmeric, fluticasone, Melatonin, Cholecalciferol (VITAMIN D PO), cetirizine, ranitidine, EPINEPHrine, telmisartan-hydrochlorothiazide, montelukast, Ferrous Fumarate-Folic Acid, metFORMIN, albuterol, furosemide, and sertraline.  Meds ordered this encounter  Medications  . furosemide (LASIX) 40 MG tablet    Sig: Take 40 mg by mouth daily as needed.  Marland Kitchen HYDROcodone-homatropine (HYDROMET) 5-1.5 MG/5ML syrup    Sig: Take 5 mLs by mouth 3 (three) times daily as needed for cough.    Dispense:  120 mL    Refill:  0  . cefdinir (OMNICEF) 300 MG capsule    Sig: Take 1 capsule (300 mg total) by mouth 2 (two) times daily.  Dispense:  20 capsule    Refill:  0  . sertraline (ZOLOFT) 25 MG tablet    Sig: Take 1 tablet (25 mg total) by mouth daily.    Dispense:  90 tablet    Refill:  0    CMA served as scribe during  this visit. History, Physical and Plan performed by medical provider. Documentation and orders reviewed and attested to.  Penni Homans, MD

## 2016-08-21 NOTE — Assessment & Plan Note (Signed)
Encouraged DASH diet, decrease po intake and increase exercise as tolerated. Needs 7-8 hours of sleep nightly. Avoid trans fats, eat small, frequent meals every 4-5 hours with lean proteins, complex carbs and healthy fats. Minimize simple carbs, referred to bariatic program

## 2016-08-21 NOTE — Assessment & Plan Note (Signed)
Flared for last several days. Encouraged increased rest and hydration, add probiotics, zinc such as Coldeze or Xicam. Treat fevers as needed, Elderberry, aged garlic and vitamin c. Allowed hydromet for cough qhs and Omnicef if symptoms worsen

## 2016-08-21 NOTE — Assessment & Plan Note (Addendum)
Albuterol HFA is helpful as needed. Encouraged to use proactively for next 3 days and then as needed.

## 2016-08-23 ENCOUNTER — Ambulatory Visit (AMBULATORY_SURGERY_CENTER): Payer: Self-pay | Admitting: *Deleted

## 2016-08-23 VITALS — Ht 66.5 in | Wt 295.0 lb

## 2016-08-23 DIAGNOSIS — Z86010 Personal history of colonic polyps: Secondary | ICD-10-CM

## 2016-08-23 LAB — VITAMIN D 1,25 DIHYDROXY
Vitamin D 1, 25 (OH)2 Total: 33 pg/mL (ref 18–72)
Vitamin D2 1, 25 (OH)2: <8 pg/mL
Vitamin D3 1, 25 (OH)2: 33 pg/mL

## 2016-08-23 NOTE — Progress Notes (Signed)
No egg or soy allergy known to patient   issues with past sedation with any surgeries  or procedures of post op N/V , no intubation problems  No diet pills per patient No home 02 use per patient  No blood thinners per patient  Pt denies issues with constipation  No A fib or A flutter  No BP stick right arm from BR CA

## 2016-08-24 ENCOUNTER — Encounter: Payer: Self-pay | Admitting: Internal Medicine

## 2016-08-30 ENCOUNTER — Encounter: Payer: Self-pay | Admitting: Family Medicine

## 2016-08-30 MED ORDER — PREDNISONE 20 MG PO TABS: ORAL_TABLET | ORAL | 0 refills | Status: DC

## 2016-09-06 ENCOUNTER — Ambulatory Visit (AMBULATORY_SURGERY_CENTER): Payer: BLUE CROSS/BLUE SHIELD | Admitting: Internal Medicine

## 2016-09-06 ENCOUNTER — Encounter: Payer: Self-pay | Admitting: Internal Medicine

## 2016-09-06 VITALS — BP 154/80 | HR 75 | Temp 97.7°F | Resp 9 | Ht 67.2 in | Wt 295.0 lb

## 2016-09-06 DIAGNOSIS — Z86010 Personal history of colonic polyps: Secondary | ICD-10-CM | POA: Diagnosis present

## 2016-09-06 MED ORDER — SODIUM CHLORIDE 0.9 % IV SOLN: 500.0000 mL | INTRAVENOUS | Status: DC

## 2016-09-06 NOTE — Progress Notes (Signed)
A/ox3 pleased with MAC, report to Jane RN 

## 2016-09-06 NOTE — Op Note (Signed)
[facility] Patient Name: [name] Procedure Date: 09/06/2016 9:50 AM MRN: [id number] Endoscopist: Gatha Mayer , MD Age: 61 Referring MD:  Date of Birth: 1955/11/28 Gender: Female Account #: [id number] Procedure:                Colonoscopy Indications:              Surveillance: Personal history of adenomatous                            polyps on last colonoscopy 5 years ago Medicines:                Propofol per Anesthesia, Monitored Anesthesia Care Procedure:                Pre-Anesthesia Assessment:                           - Prior to the procedure, a History and Physical                            was performed, and patient medications and                            allergies were reviewed. The patient's tolerance of                            previous anesthesia was also reviewed. The risks                            and benefits of the procedure and the sedation                            options and risks were discussed with the patient.                            All questions were answered, and informed consent                            was obtained. Prior Anticoagulants: The patient has                            taken no previous anticoagulant or antiplatelet                            agents. ASA Grade Assessment: III - A patient with                            severe systemic disease. After reviewing the risks                            and benefits, the patient was deemed in                            satisfactory condition to undergo the procedure.  After obtaining informed consent, the colonoscope                            was passed under direct vision. Throughout the                            procedure, the patient's blood pressure, pulse, and                            oxygen saturations were monitored continuously. The                            Colonoscope was introduced through the anus and   advanced to the the cecum, identified by                            appendiceal orifice and ileocecal valve. The                            colonoscopy was performed without difficulty. The                            patient tolerated the procedure well. The quality                            of the bowel preparation was excellent. The bowel                            preparation used was Miralax. The ileocecal valve,                            appendiceal orifice, and rectum were photographed. Scope In: 9:53:46 AM Scope Out: 10:08:30 AM Scope Withdrawal Time: 0 hours 12 minutes 24 seconds  Total Procedure Duration: 0 hours 14 minutes 44 seconds  Findings:                 The perianal and digital rectal examinations were                            normal.                           Multiple small and large-mouthed diverticula were                            found in the sigmoid colon and descending colon.                            There was narrowing of the colon in association                            with the diverticular opening.                           Scattered small and large-mouthed diverticula were  found in the right colon.                           The exam was otherwise without abnormality on                            direct and retroflexion views. Complications:            No immediate complications. Estimated Blood Loss:     Estimated blood loss: none. Impression:               - Severe diverticulosis in the sigmoid colon and in                            the descending colon. There was narrowing of the                            colon in association with the diverticular opening.                           - Mild diverticulosis in the right colon.                           - The examination was otherwise normal on direct                            and retroflexion views.                           - No specimens collected. Recommendation:            - Patient has a contact number available for                            emergencies. The signs and symptoms of potential                            delayed complications were discussed with the                            patient. Return to normal activities tomorrow.                            Written discharge instructions were provided to the                            patient.                           - Resume previous diet.                           - Continue present medications.                           - Repeat colonoscopy in 5 years for surveillance.                           -  Could go longer probably but she had an aunt with                            colon cancer and does have hx small adenomas and                            prefers closer surveillance. Gatha Mayer, MD 09/06/2016 10:15:23 AM This report has been signed electronically.

## 2016-09-06 NOTE — Patient Instructions (Addendum)
   No polyps today. Still have diverticulosis.  Your next routine colonoscopy should be in 5 years - 2023.  I appreciate the opportunity to care for you. Gatha Mayer, MD, FACG  YOU HAD AN ENDOSCOPIC PROCEDURE TODAY AT [facility]:   Refer to the procedure report that was given to you for any specific questions about what was found during the examination.  If the procedure report does not answer your questions, please call your gastroenterologist to clarify.  If you requested that your care partner not be given the details of your procedure findings, then the procedure report has been included in a sealed envelope for you to review at your convenience later.  YOU SHOULD EXPECT: Some feelings of bloating in the abdomen. Passage of more gas than usual.  Walking can help get rid of the air that was put into your GI tract during the procedure and reduce the bloating. If you had a lower endoscopy (such as a colonoscopy or flexible sigmoidoscopy) you may notice spotting of blood in your stool or on the toilet paper. If you underwent a bowel prep for your procedure, you may not have a normal bowel movement for a few days.  Please Note:  You might notice some irritation and congestion in your nose or some drainage.  This is from the oxygen used during your procedure.  There is no need for concern and it should clear up in a day or so.  SYMPTOMS TO REPORT IMMEDIATELY:   Following lower endoscopy (colonoscopy or flexible sigmoidoscopy):  Excessive amounts of blood in the stool  Significant tenderness or worsening of abdominal pains  Swelling of the abdomen that is new, acute  Fever of 100F or higher   For urgent or emergent issues, a gastroenterologist can be reached at any hour by calling [phone].   DIET:  We do recommend a small meal at first, but then you may proceed to your regular diet.  Drink plenty of fluids but you should avoid alcoholic beverages for 24  hours.  ACTIVITY:  You should plan to take it easy for the rest of today and you should NOT DRIVE or use heavy machinery until tomorrow (because of the sedation medicines used during the test).    FOLLOW UP: Our staff will call the number listed on your records the next business day following your procedure to check on you and address any questions or concerns that you may have regarding the information given to you following your procedure. If we do not reach you, we will leave a message.  However, if you are feeling well and you are not experiencing any problems, there is no need to return our call.  We will assume that you have returned to your regular daily activities without incident.  If any biopsies were taken you will be contacted by phone or by letter within the next 1-3 weeks.  Please call us at [phone] if you have not heard about the biopsies in 3 weeks.    SIGNATURES/CONFIDENTIALITY: You and/or your care partner have signed paperwork which will be entered into your electronic medical record.  These signatures attest to the fact that that the information above on your After Visit Summary has been reviewed and is understood.  Full responsibility of the confidentiality of this discharge information lies with you and/or your care-partner.  Diverticulosis information given.   Recall colonoscopy 5 years-2023

## 2016-09-07 ENCOUNTER — Telehealth: Payer: Self-pay | Admitting: *Deleted

## 2016-09-07 NOTE — Telephone Encounter (Signed)
  Follow up Call-  Call back number 09/06/2016  Post procedure Call Back phone  # [phone]  Permission to leave phone message Yes  Some recent data might be hidden     Patient questions:  Do you have a fever, pain , or abdominal swelling? No. Pain Score  0 *  Have you tolerated food without any problems? Yes.    Have you been able to return to your normal activities? Yes.    Do you have any questions about your discharge instructions: Diet   No. Medications  No. Follow up visit  No.  Do you have questions or concerns about your Care? No.  Actions: * If pain score is 4 or above: No action needed, pain <4.

## 2016-09-13 ENCOUNTER — Ambulatory Visit
Admission: RE | Admit: 2016-09-13 | Discharge: 2016-09-13 | Disposition: A | Discharge status: Home / Self Care | Payer: BLUE CROSS/BLUE SHIELD | Source: Ambulatory Visit | Attending: Obstetrics and Gynecology | Admitting: Obstetrics and Gynecology

## 2016-09-13 DIAGNOSIS — Z9011 Acquired absence of right breast and nipple: Secondary | ICD-10-CM

## 2016-09-13 DIAGNOSIS — Z853 Personal history of malignant neoplasm of breast: Secondary | ICD-10-CM

## 2016-09-22 ENCOUNTER — Ambulatory Visit (HOSPITAL_BASED_OUTPATIENT_CLINIC_OR_DEPARTMENT_OTHER)
Admission: RE | Admit: 2016-09-22 | Discharge: 2016-09-22 | Disposition: A | Discharge status: Home / Self Care | Payer: BLUE CROSS/BLUE SHIELD | Source: Ambulatory Visit | Attending: Medical | Admitting: Medical

## 2016-09-22 ENCOUNTER — Encounter: Payer: Self-pay | Admitting: Medical

## 2016-09-22 ENCOUNTER — Ambulatory Visit (INDEPENDENT_AMBULATORY_CARE_PROVIDER_SITE_OTHER): Payer: BLUE CROSS/BLUE SHIELD | Admitting: Medical

## 2016-09-22 VITALS — BP 108/64 | HR 85 | Temp 99.0°F | Resp 16 | Ht 67.0 in | Wt 290.2 lb

## 2016-09-22 DIAGNOSIS — M25551 Pain in right hip: Secondary | ICD-10-CM

## 2016-09-22 DIAGNOSIS — K429 Umbilical hernia without obstruction or gangrene: Secondary | ICD-10-CM | POA: Diagnosis not present

## 2016-09-22 DIAGNOSIS — R1031 Right lower quadrant pain: Secondary | ICD-10-CM

## 2016-09-22 DIAGNOSIS — K5730 Diverticulosis of large intestine without perforation or abscess without bleeding: Secondary | ICD-10-CM | POA: Diagnosis not present

## 2016-09-22 LAB — CBC WITH DIFFERENTIAL/PLATELET
Basophils Absolute: 0 10*3/uL (ref 0.0–0.1)
Basophils Relative: 0.5 % (ref 0.0–3.0)
Eosinophils Absolute: 0.1 10*3/uL (ref 0.0–0.7)
Eosinophils Relative: 1.3 % (ref 0.0–5.0)
HCT: 36.3 % (ref 36.0–46.0)
Hemoglobin: 11.9 g/dL — ABNORMAL LOW (ref 12.0–15.0)
Lymphocytes Relative: 40.8 % (ref 12.0–46.0)
Lymphs Abs: 3.4 10*3/uL (ref 0.7–4.0)
MCHC: 32.9 g/dL (ref 30.0–36.0)
MCV: 85.3 fl (ref 78.0–100.0)
Monocytes Absolute: 0.9 10*3/uL (ref 0.1–1.0)
Monocytes Relative: 10.6 % (ref 3.0–12.0)
Neutro Abs: 4 10*3/uL (ref 1.4–7.7)
Neutrophils Relative %: 46.8 % (ref 43.0–77.0)
Platelets: 271 10*3/uL (ref 150.0–400.0)
RBC: 4.26 Mil/uL (ref 3.87–5.11)
RDW: 16.5 % — ABNORMAL HIGH (ref 11.5–15.5)
WBC: 8.4 10*3/uL (ref 4.0–10.5)

## 2016-09-22 LAB — COMPREHENSIVE METABOLIC PANEL
ALT: 35 U/L (ref 0–35)
AST: 37 U/L (ref 0–37)
Albumin: 4.4 g/dL (ref 3.5–5.2)
Alkaline Phosphatase: 72 U/L (ref 39–117)
BUN: 30 mg/dL — ABNORMAL HIGH (ref 6–23)
CO2: 28 mEq/L (ref 19–32)
Calcium: 10.6 mg/dL — ABNORMAL HIGH (ref 8.4–10.5)
Chloride: 101 mEq/L (ref 96–112)
Creatinine, Ser: 1.14 mg/dL (ref 0.40–1.20)
GFR: 51.3 mL/min — ABNORMAL LOW (ref >60.00)
Glucose, Bld: 121 mg/dL — ABNORMAL HIGH (ref 70–99)
Potassium: 4.2 mEq/L (ref 3.5–5.1)
Sodium: 139 mEq/L (ref 135–145)
Total Bilirubin: 0.5 mg/dL (ref 0.2–1.2)
Total Protein: 7.8 g/dL (ref 6.0–8.3)

## 2016-09-22 MED ORDER — TRAMADOL HCL 50 MG PO TABS: 50.0000 mg | ORAL_TABLET | Freq: Three times a day (TID) | ORAL | 0 refills | Status: DC | PRN

## 2016-09-22 MED ORDER — IOPAMIDOL (ISOVUE-300) INJECTION 61%
100.0000 mL | Freq: Once | INTRAVENOUS | Status: AC | PRN
  Administered 2016-09-22: 100 mL via INTRAVENOUS

## 2016-09-22 MED ORDER — DICLOFENAC SODIUM 75 MG PO TBEC: 75.0000 mg | DELAYED_RELEASE_TABLET | Freq: Two times a day (BID) | ORAL | 0 refills | Status: DC

## 2016-09-22 NOTE — Progress Notes (Signed)
Subjective:    Patient ID: [name], female    DOB: [birth year], 61 y.o.   MRN: [id number]  HPI  Pt in for some rt groin area pain/rt lower quadrant pain. Pt states she does not know what is causing pain. Pt states pain for 3-4 weeks. Pt just states pain came on very slowly. Over last week pain is worse.(moderat to severe now at times)  Pt pushrf on area last night and cause pain. Pt thought initially was a groin muscle strain. But no injury reported. Pain last night on palpation so much so she could not sleep.  Pt thought maybe fever last night but did not check. No nausea, no vomiting. Normal appetite.No pain on urination. No pain in her back.  Pt has hysterectomy. Pt thinks she has appendix.     Review of Systems  Constitutional: Negative for chills, fatigue and fever.  Respiratory: Negative for chest tightness, shortness of breath and wheezing.   Cardiovascular: Negative for chest pain and palpitations.  Gastrointestinal: Positive for abdominal pain. Negative for abdominal distention, blood in stool, constipation, diarrhea, nausea and vomiting.       See hpi.  Genitourinary: Negative for dysuria, flank pain, frequency, genital sores and hematuria.  Musculoskeletal: Negative for back pain.  Neurological: Negative for dizziness and headaches.  Hematological: Negative for adenopathy. Does not bruise/bleed easily.  Psychiatric/Behavioral: Negative for behavioral problems and confusion.    Past Medical History:  Diagnosis Date  . Allergic rhinitis   . Allergy   . Anemia 07/17/2016  . Anxiety   . Asthma    seasonal  . Blood transfusion without reported diagnosis   . Breast cancer ([place])    right  . Bronchitis   . Colon polyps   . Depression   . Diabetes mellitus    borderline but takes metformin  . Edema   . Family history of breast cancer in first degree relative   . GERD (gastroesophageal reflux disease)   . Hyperlipidemia 08/21/2016   no meds  .  Hyperparathyroidism   . Hypertension    controlled by medications  . Neuromuscular disorder ([facility])    Fibromyalgia  . Obesity   . Osteoarthritis    RA  . PONV (postoperative nausea and vomiting)    occasional  . Sleep apnea    wears cpap  . Viral meningitis   . Vitamin D deficiency      Social History   Social History  . Marital status: Widowed    Spouse name: N/A  . Number of children: 1  . Years of education: N/A   Occupational History  . [place] History Main Topics  . Smoking status: Never Smoker  . Smokeless tobacco: Never Used  . Alcohol use Yes     Comment: occ.  . Drug use: No  . Sexual activity: Not on file   Other Topics Concern  . Not on file   Social History Narrative  . No narrative on file    Past Surgical History:  Procedure Laterality Date  . ABDOMINAL HYSTERECTOMY  1998  . ADENOIDECTOMY    . BREAST EXCISIONAL BIOPSY Left 1993   benign  . BREAST SURGERY     Tran flap due to breast cancer  . CESAREAN SECTION  1988  . COLONOSCOPY  08/14/2011  . HAND SURGERY     dog bite, right hand  . HAND SURGERY     trauma, left hand  . KNEE ARTHROSCOPY  bilateral  . left knee replacement  2010  . MASTECTOMY  01/13/94   Right breast  . parathyroid resection    . PARATHYROIDECTOMY    . POLYPECTOMY    . rectal abscess    . SEPTOPLASTY  1980  . SHOULDER ARTHROSCOPY Right 11/09/2015   Procedure: ARTHROSCOPY SHOULDER-acromioplasty, distal clavicle resection and debridement;  Surgeon: Melrose Nakayama, MD;  Location: [place];  Service: Orthopedics;  Laterality: Right;  . shoulder arthroscopy     rotator cuff repair  . TOE SURGERY Right    paronychia and adenoma removed  . TONSILLECTOMY    . TOTAL KNEE ARTHROPLASTY  06/18/08   [facility]  . TUMOR REMOVAL  1982   , scalp    Family History  Problem Relation Age of Onset  . Emphysema Father   . Lung cancer Father     lung ca dx 64  . Other Father     prostate issues  .  Breast cancer Maternal Aunt     dx late 64s  . Colon cancer Maternal Aunt     dx 29s  . Colon polyps Maternal Aunt   . Prostate cancer Maternal Grandfather 85    d. 85y  . Heart disease Paternal Grandfather   . Heart attack Paternal Grandfather     d. 50y  . Diabetes Paternal Grandfather   . Asthma Daughter     "seasonal"  . Arthritis Maternal Grandmother   . Aneurysm Maternal Grandmother     d. brain aneurysm at 49  . Arthritis Mother   . Colon polyps Mother   . Multiple sclerosis Sister   . Breast cancer Sister 38    L IDC and DCIS; ER/PR+, Her2-  . Fibrocystic breast disease Sister   . Arthritis/Rheumatoid Sister   . Cancer Maternal Uncle     d. mouth cancer at younger age; smoker  . Other Paternal Uncle     muscle issues - couldn't walk or talk; d. 20y  . Cancer Maternal Aunt     lymphoma, dx 22s  . Ovarian cancer Maternal Aunt     dx 34s; d. late 32s  . Lung cancer Maternal Uncle     d. 22y; former smoker  . Throat cancer Cousin     maternal 1st cousin; smoker  . Breast cancer Cousin 66    maternal 1st cousin  . Other Paternal Uncle     prostate issues  . Breast cancer Cousin     paternal 1st cousin dx late 1s  . Throat cancer Cousin     maternal 1st cousin; used SL tobacco  . Prostate cancer Cousin     maternal 1st cousin dx 73s  . Esophageal cancer Neg Hx   . Rectal cancer Neg Hx   . Stomach cancer Neg Hx     Allergies  Allergen Reactions  . Mucinex [Guaifenesin Er] Shortness Of Breath  . Penicillins Hives and Other (See Comments)    Has patient had a PCN reaction causing immediate rash, facial/tongue/throat swelling, SOB or lightheadedness with hypotension: no Has patient had a PCN reaction causing severe rash involving mucus membranes or skin necrosis: no Has patient had a PCN reaction that required hospitalization no Has patient had a PCN reaction occurring within the last 10 years: no If all of the above answers are "NO", then may proceed with  Cephalosporin use.   . Prozac [Fluoxetine Hcl] Hives  . Lexapro [Escitalopram Oxalate]     Nausea and hypersalivation.  Current Outpatient Prescriptions on File Prior to Visit  Medication Sig Dispense Refill  . albuterol (VENTOLIN HFA) 108 (90 Base) MCG/ACT inhaler Inhale 2 puffs into the lungs every 6 (six) hours as needed for wheezing or shortness of breath. 18 g 1  . Calcium Carbonate (CALTRATE 600) 1500 MG TABS Take 1,500 mg by mouth daily.     . cetirizine (ZYRTEC) 10 MG tablet Take 1 tablet (10 mg total) by mouth 2 (two) times daily. 60 tablet 5  . Cholecalciferol (VITAMIN D PO) Take 1 tablet by mouth daily.    . Cinnamon 500 MG capsule Take 1,000 mg by mouth daily.    Marland Kitchen EPINEPHrine 0.3 mg/0.3 mL IJ SOAJ injection Inject 0.3 mLs (0.3 mg total) into the muscle once as needed (Throat or facial swelling or difficulty breathing). 1 Device 0  . Ferrous Fumarate-Folic Acid (HEMOCYTE-F) 324-1 MG TABS Take 1 tablet by mouth daily. 30 each 6  . fluticasone (FLONASE) 50 MCG/ACT nasal spray Place 2 sprays into both nostrils daily. (Patient taking differently: Place 2 sprays into both nostrils daily as needed for allergies. ) 16 g 1  . furosemide (LASIX) 40 MG tablet Take 40 mg by mouth daily as needed.    . Ginger 500 MG CAPS Take 500 mg by mouth daily.     Marland Kitchen KRILL OIL PO Take 1 capsule by mouth daily.    . Melatonin 5 MG TABS Take 5 mg by mouth at bedtime as needed (For sleep.).    Marland Kitchen metFORMIN (GLUCOPHAGE) 500 MG tablet TAKE 1 TABLET (500 MG TOTAL) BY MOUTH 2 (TWO) TIMES DAILY WITH A MEAL. 180 tablet 0  . montelukast (SINGULAIR) 10 MG tablet Take 1 tablet (10 mg total) by mouth at bedtime. 30 tablet 5  . Multiple Vitamins-Minerals (MULTIVITAMIN & MINERAL PO) Take 1 tablet by mouth daily.    Marland Kitchen OVER THE COUNTER MEDICATION Apple cider vinegar tablets 3 once a day    . Probiotic Product (PROBIOTIC ADVANCED PO) Take 1 capsule by mouth daily.    . ranitidine (ZANTAC) 150 MG tablet Take 1 tablet  (150 mg total) by mouth 2 (two) times daily. 60 tablet 5  . sertraline (ZOLOFT) 25 MG tablet Take 1 tablet (25 mg total) by mouth daily. 90 tablet 0  . telmisartan-hydrochlorothiazide (MICARDIS HCT) 80-25 MG tablet TAKE ONE TABLET BY MOUTH ONCE DAILY 90 tablet 1  . Turmeric 500 MG CAPS Take 500 mg by mouth daily.      Current Facility-Administered Medications on File Prior to Visit  Medication Dose Route Frequency Provider Last Rate Last Dose  . 0.9 %  sodium chloride infusion  500 mL Intravenous Continuous Gatha Mayer, MD        BP 108/64 (BP Location: Left Arm, Patient Position: Sitting, Cuff Size: Large)   Pulse 85   Temp 99 F (37.2 C) (Oral)   Resp 16   Ht _0  (1.702 m)   Wt 290 lb 3.2 oz (131.6 kg)   SpO2 98%   BMI 45.45 kg/m       Objective:   Physical Exam   General Appearance- Not in acute distress.  HEENT Eyes- Scleraeral/Conjuntiva-bilat- Not Yellow. Mouth & Throat- Normal.  Chest and Lung Exam Auscultation: Breath sounds:-Normal. Adventitious sounds:- No Adventitious sounds.  Cardiovascular Auscultation:Rythm - Regular. Heart Sounds -Normal heart sounds.  Abdomen Inspection:-Inspection Normal.  Palpation/Perucssion: Palpation and Percussion of the abdomen reveal- Rt anterior superior superior iliac crest pain. Rt groin pain on palpaiton.(but also  rt lower quadrant pain moderate to severe), No Rebound tenderness, No rigidity(Guarding) and No Palpable abdominal masses.  Liver:-Normal.  Spleen:- Normal.   Back- no cva  Tenderness.  Rt hip- faint pain on range of motion.       Assessment & Plan:  Your pain is difficult to make diagnosis without work up. Some concern for appendicitis, groin muscle strain, or possible hernia?  Will get cbc, cmp, ua and abd/pelvis ct.  Rest hydrate and blnad diet. Rx diclofenac for pain/inflammation. But also making tramadol available for more severe pain.  Follow up in 7 days or as needed  Please go down  stairs to schedule CT and start contrast.  Kinesha Auten, Percell Miller, PA-C

## 2016-09-22 NOTE — Progress Notes (Signed)
Pre visit review using our clinic review tool, if applicable. No additional management support is needed unless otherwise documented below in the visit note. 

## 2016-09-22 NOTE — Patient Instructions (Addendum)
Your pain is difficult to make diagnosis without work up. Some concern for appendicitis, groin muscle strain, or possible hernia?  Will get cbc, cmp, ua and abd/pelvis ct.  Rest hydrate and blnad diet. Rx diclofenac for pain/inflammation. But also making tramadol available for more severe pain.   Follow up in 7 days or as needed  Please go down stairs to schedule CT and start contrast.

## 2016-09-26 NOTE — Telephone Encounter (Signed)
referal to sports med placed. Can she be seen this week.

## 2016-09-27 NOTE — Telephone Encounter (Signed)
Would you put pt sports med referral on hold. She will update me in one week if she wants to go.

## 2016-09-27 NOTE — Telephone Encounter (Signed)
Referral sent to Sports Medicine, awaiting appt

## 2016-10-05 ENCOUNTER — Ambulatory Visit: Payer: BLUE CROSS/BLUE SHIELD | Admitting: Family Medicine

## 2016-10-31 ENCOUNTER — Other Ambulatory Visit: Payer: Self-pay | Admitting: Family Medicine

## 2016-11-09 ENCOUNTER — Encounter (HOSPITAL_COMMUNITY): Payer: Self-pay

## 2016-11-16 ENCOUNTER — Other Ambulatory Visit: Payer: Self-pay | Admitting: Family Medicine

## 2016-11-19 ENCOUNTER — Other Ambulatory Visit: Payer: Self-pay | Admitting: Family Medicine

## 2016-11-20 MED ORDER — METFORMIN HCL 500 MG PO TABS: 500.0000 mg | ORAL_TABLET | Freq: Two times a day (BID) | ORAL | 0 refills | Status: DC

## 2016-11-27 ENCOUNTER — Ambulatory Visit: Payer: BLUE CROSS/BLUE SHIELD | Admitting: Family Medicine

## 2016-12-01 ENCOUNTER — Ambulatory Visit (HOSPITAL_BASED_OUTPATIENT_CLINIC_OR_DEPARTMENT_OTHER)
Admission: RE | Admit: 2016-12-01 | Discharge: 2016-12-01 | Disposition: A | Discharge status: Home / Self Care | Payer: BLUE CROSS/BLUE SHIELD | Source: Ambulatory Visit | Attending: Family Medicine | Admitting: Family Medicine

## 2016-12-01 ENCOUNTER — Ambulatory Visit (INDEPENDENT_AMBULATORY_CARE_PROVIDER_SITE_OTHER): Payer: BLUE CROSS/BLUE SHIELD | Admitting: Family Medicine

## 2016-12-01 ENCOUNTER — Encounter: Payer: Self-pay | Admitting: Family Medicine

## 2016-12-01 VITALS — BP 128/82 | HR 87 | Temp 98.8°F | Resp 18 | Wt 283.0 lb

## 2016-12-01 DIAGNOSIS — E559 Vitamin D deficiency, unspecified: Secondary | ICD-10-CM | POA: Diagnosis not present

## 2016-12-01 DIAGNOSIS — E6609 Other obesity due to excess calories: Secondary | ICD-10-CM

## 2016-12-01 DIAGNOSIS — M25561 Pain in right knee: Secondary | ICD-10-CM | POA: Insufficient documentation

## 2016-12-01 DIAGNOSIS — M25551 Pain in right hip: Secondary | ICD-10-CM | POA: Diagnosis present

## 2016-12-01 DIAGNOSIS — E213 Hyperparathyroidism, unspecified: Secondary | ICD-10-CM

## 2016-12-01 DIAGNOSIS — E782 Mixed hyperlipidemia: Secondary | ICD-10-CM | POA: Diagnosis not present

## 2016-12-01 DIAGNOSIS — G8929 Other chronic pain: Secondary | ICD-10-CM

## 2016-12-01 DIAGNOSIS — M7121 Synovial cyst of popliteal space [Baker], right knee: Secondary | ICD-10-CM | POA: Diagnosis not present

## 2016-12-01 DIAGNOSIS — I10 Essential (primary) hypertension: Secondary | ICD-10-CM

## 2016-12-01 DIAGNOSIS — M25461 Effusion, right knee: Secondary | ICD-10-CM | POA: Diagnosis not present

## 2016-12-01 DIAGNOSIS — M25552 Pain in left hip: Secondary | ICD-10-CM | POA: Insufficient documentation

## 2016-12-01 DIAGNOSIS — H61893 Other specified disorders of external ear, bilateral: Secondary | ICD-10-CM

## 2016-12-01 DIAGNOSIS — R609 Edema, unspecified: Secondary | ICD-10-CM

## 2016-12-01 DIAGNOSIS — E1140 Type 2 diabetes mellitus with diabetic neuropathy, unspecified: Secondary | ICD-10-CM | POA: Diagnosis not present

## 2016-12-01 LAB — COMPREHENSIVE METABOLIC PANEL
ALT: 21 U/L (ref 0–35)
AST: 19 U/L (ref 0–37)
Albumin: 4 g/dL (ref 3.5–5.2)
Alkaline Phosphatase: 77 U/L (ref 39–117)
BUN: 20 mg/dL (ref 6–23)
CO2: 31 mEq/L (ref 19–32)
Calcium: 9.8 mg/dL (ref 8.4–10.5)
Chloride: 102 mEq/L (ref 96–112)
Creatinine, Ser: 1.03 mg/dL (ref 0.40–1.20)
GFR: 57.64 mL/min — ABNORMAL LOW (ref >60.00)
Glucose, Bld: 122 mg/dL — ABNORMAL HIGH (ref 70–99)
Potassium: 4.7 mEq/L (ref 3.5–5.1)
Sodium: 139 mEq/L (ref 135–145)
Total Bilirubin: 0.3 mg/dL (ref 0.2–1.2)
Total Protein: 7.2 g/dL (ref 6.0–8.3)

## 2016-12-01 LAB — CBC
HCT: 33.7 % — ABNORMAL LOW (ref 36.0–46.0)
Hemoglobin: 11.4 g/dL — ABNORMAL LOW (ref 12.0–15.0)
MCHC: 33.7 g/dL (ref 30.0–36.0)
MCV: 84.1 fl (ref 78.0–100.0)
Platelets: 235 10*3/uL (ref 150.0–400.0)
RBC: 4 Mil/uL (ref 3.87–5.11)
RDW: 15.4 % (ref 11.5–15.5)
WBC: 8.2 10*3/uL (ref 4.0–10.5)

## 2016-12-01 LAB — HEMOGLOBIN A1C: Hgb A1c MFr Bld: 6.6 % — ABNORMAL HIGH (ref 4.6–6.5)

## 2016-12-01 LAB — VITAMIN D 25 HYDROXY (VIT D DEFICIENCY, FRACTURES): VITD: 59.65 ng/mL (ref 30.00–100.00)

## 2016-12-01 NOTE — Assessment & Plan Note (Signed)
adovcare is a shake and vitamin regimen that is helping her metabolism and has helped her loose weight. Encouraged bariatric referral

## 2016-12-01 NOTE — Assessment & Plan Note (Signed)
Calcium is trending up again, did have a parathyroid surgery in past. Check labs today

## 2016-12-01 NOTE — Patient Instructions (Signed)

## 2016-12-01 NOTE — Assessment & Plan Note (Signed)
Well controlled, no changes to meds. Encouraged heart healthy diet such as the DASH diet and exercise as tolerated.  °

## 2016-12-01 NOTE — Assessment & Plan Note (Signed)
Check level 

## 2016-12-01 NOTE — Assessment & Plan Note (Signed)
Encouraged heart healthy diet, increase exercise, avoid trans fats, consider a krill oil cap daily 

## 2016-12-01 NOTE — Assessment & Plan Note (Signed)
hgba1c acceptable, minimize simple carbs. Increase exercise as tolerated.  

## 2016-12-01 NOTE — Progress Notes (Signed)
Subjective:  I acted as a Education administrator for Dr. Charlett Blake. [place], [place]  Patient ID: [name], female    DOB: [birth year], 61 y.o.   MRN: [id number]  No chief complaint on file.   HPI  Patient is in today for an acute visit for right leg pain. She states she denies any injury. she is noting increased right knee and right calf pain over past few days. No trauma or fall. also notes pedal edema b/l. Also notes her ears continue to itch. She denies any polyuria or polydipsia. No recent febrile illness or hospitalizations. Denies CP/palp/SOB/HA/congestion/fevers/GI or GU c/o. Taking meds as prescribed   Patient Care Team: Mosie Lukes, MD as PCP - General (Family Medicine)   Past Medical History:  Diagnosis Date  . Allergic rhinitis   . Allergy   . Anemia 07/17/2016  . Anxiety   . Asthma    seasonal  . Blood transfusion without reported diagnosis   . Breast cancer ([facility])    right  . Bronchitis   . Colon polyps   . Depression   . Diabetes mellitus    borderline but takes metformin  . Edema   . Family history of breast cancer in first degree relative   . GERD (gastroesophageal reflux disease)   . Hyperlipidemia 08/21/2016   no meds  . Hyperparathyroidism   . Hypertension    controlled by medications  . Neuromuscular disorder ([facility])    Fibromyalgia  . Obesity   . Osteoarthritis    RA  . PONV (postoperative nausea and vomiting)    occasional  . Sleep apnea    wears cpap  . Viral meningitis   . Vitamin D deficiency     Past Surgical History:  Procedure Laterality Date  . ABDOMINAL HYSTERECTOMY  1998  . ADENOIDECTOMY    . BREAST EXCISIONAL BIOPSY Left 1993   benign  . BREAST SURGERY     Tran flap due to breast cancer  . CESAREAN SECTION  1988  . COLONOSCOPY  08/14/2011  . HAND SURGERY     dog bite, right hand  . HAND SURGERY     trauma, left hand  . KNEE ARTHROSCOPY     bilateral  . left knee replacement  2010  . MASTECTOMY  01/13/94   Right breast  .  parathyroid resection    . PARATHYROIDECTOMY    . POLYPECTOMY    . rectal abscess    . SEPTOPLASTY  1980  . SHOULDER ARTHROSCOPY Right 11/09/2015   Procedure: ARTHROSCOPY SHOULDER-acromioplasty, distal clavicle resection and debridement;  Surgeon: Melrose Nakayama, MD;  Location: [facility];  Service: Orthopedics;  Laterality: Right;  . shoulder arthroscopy     rotator cuff repair  . TOE SURGERY Right    paronychia and adenoma removed  . TONSILLECTOMY    . TOTAL KNEE ARTHROPLASTY  06/18/08   [facility]  . TUMOR REMOVAL  1982   , scalp    Family History  Problem Relation Age of Onset  . Emphysema Father   . Lung cancer Father        lung ca dx 66  . Other Father        prostate issues  . Breast cancer Maternal Aunt        dx late 43s  . Colon cancer Maternal Aunt        dx 74s  . Colon polyps Maternal Aunt   . Prostate cancer Maternal Grandfather 45  d. 85y  . Heart disease Paternal Grandfather   . Heart attack Paternal Grandfather        d. 50y  . Diabetes Paternal Grandfather   . Asthma Daughter        "seasonal"  . Arthritis Maternal Grandmother   . Aneurysm Maternal Grandmother        d. brain aneurysm at 9  . Arthritis Mother   . Colon polyps Mother   . Multiple sclerosis Sister   . Breast cancer Sister 6       L IDC and DCIS; ER/PR+, Her2-  . Fibrocystic breast disease Sister   . Arthritis/Rheumatoid Sister   . Cancer Maternal Uncle        d. mouth cancer at younger age; smoker  . Other Paternal Uncle        muscle issues - couldn't walk or talk; d. 20y  . Cancer Maternal Aunt        lymphoma, dx 14s  . Ovarian cancer Maternal Aunt        dx 21s; d. late 7s  . Lung cancer Maternal Uncle        d. 81y; former smoker  . Throat cancer Cousin        maternal 1st cousin; smoker  . Breast cancer Cousin 22       maternal 1st cousin  . Other Paternal Uncle        prostate issues  . Breast cancer Cousin        paternal 1st cousin dx late 23s  . Throat cancer  Cousin        maternal 1st cousin; used SL tobacco  . Prostate cancer Cousin        maternal 1st cousin dx 65s  . Esophageal cancer Neg Hx   . Rectal cancer Neg Hx   . Stomach cancer Neg Hx     Social History   Social History  . Marital status: Widowed    Spouse name: N/A  . Number of children: 1  . Years of education: N/A   Occupational History  . [place] History Main Topics  . Smoking status: Never Smoker  . Smokeless tobacco: Never Used  . Alcohol use Yes     Comment: occ.  . Drug use: No  . Sexual activity: Not on file   Other Topics Concern  . Not on file   Social History Narrative  . No narrative on file    Outpatient Medications Prior to Visit  Medication Sig Dispense Refill  . albuterol (VENTOLIN HFA) 108 (90 Base) MCG/ACT inhaler Inhale 2 puffs into the lungs every 6 (six) hours as needed for wheezing or shortness of breath. 18 g 1  . Calcium Carbonate (CALTRATE 600) 1500 MG TABS Take 1,500 mg by mouth daily.     . cetirizine (ZYRTEC) 10 MG tablet Take 1 tablet (10 mg total) by mouth 2 (two) times daily. 60 tablet 5  . Cholecalciferol (VITAMIN D PO) Take 1 tablet by mouth daily.    . Cinnamon 500 MG capsule Take 1,000 mg by mouth daily.    . diclofenac (VOLTAREN) 75 MG EC tablet Take 1 tablet (75 mg total) by mouth 2 (two) times daily. 20 tablet 0  . EPINEPHrine 0.3 mg/0.3 mL IJ SOAJ injection Inject 0.3 mLs (0.3 mg total) into the muscle once as needed (Throat or facial swelling or difficulty breathing). 1 Device 0  . Ferrous Fumarate-Folic Acid (HEMOCYTE-F) 324-1 MG  TABS Take 1 tablet by mouth daily. 30 each 6  . fluticasone (FLONASE) 50 MCG/ACT nasal spray Place 2 sprays into both nostrils daily. (Patient taking differently: Place 2 sprays into both nostrils daily as needed for allergies. ) 16 g 1  . furosemide (LASIX) 40 MG tablet TAKE ONE TABLET BY MOUTH TWICE DAILY 60 tablet 3  . Ginger 500 MG CAPS Take 500 mg by mouth daily.      Marland Kitchen KRILL OIL PO Take 1 capsule by mouth daily.    . metFORMIN (GLUCOPHAGE) 500 MG tablet Take 1 tablet (500 mg total) by mouth 2 (two) times daily with a meal. 180 tablet 0  . montelukast (SINGULAIR) 10 MG tablet Take 1 tablet (10 mg total) by mouth at bedtime. 30 tablet 5  . Multiple Vitamins-Minerals (MULTIVITAMIN & MINERAL PO) Take 1 tablet by mouth daily.    Marland Kitchen OVER THE COUNTER MEDICATION Apple cider vinegar tablets 3 once a day    . Probiotic Product (PROBIOTIC ADVANCED PO) Take 1 capsule by mouth daily.    . ranitidine (ZANTAC) 150 MG tablet Take 1 tablet (150 mg total) by mouth 2 (two) times daily. 60 tablet 5  . sertraline (ZOLOFT) 25 MG tablet Take 1 tablet (25 mg total) by mouth daily. 90 tablet 0  . telmisartan-hydrochlorothiazide (MICARDIS HCT) 80-25 MG tablet TAKE ONE TABLET BY MOUTH ONCE DAILY 90 tablet 1  . traMADol (ULTRAM) 50 MG tablet Take 1 tablet (50 mg total) by mouth every 8 (eight) hours as needed. 12 tablet 0  . Turmeric 500 MG CAPS Take 500 mg by mouth daily.     . furosemide (LASIX) 40 MG tablet Take 40 mg by mouth daily as needed.    . Melatonin 5 MG TABS Take 5 mg by mouth at bedtime as needed (For sleep.).    Marland Kitchen 0.9 %  sodium chloride infusion      No facility-administered medications prior to visit.     Allergies  Allergen Reactions  . Mucinex [Guaifenesin Er] Shortness Of Breath  . Penicillins Hives and Other (See Comments)    Has patient had a PCN reaction causing immediate rash, facial/tongue/throat swelling, SOB or lightheadedness with hypotension: no Has patient had a PCN reaction causing severe rash involving mucus membranes or skin necrosis: no Has patient had a PCN reaction that required hospitalization no Has patient had a PCN reaction occurring within the last 10 years: no If all of the above answers are "NO", then may proceed with Cephalosporin use.   . Prozac [Fluoxetine Hcl] Hives  . Lexapro [Escitalopram Oxalate]     Nausea and  hypersalivation.     Review of Systems  Constitutional: Positive for malaise/fatigue. Negative for fever.  HENT: Negative for congestion.   Eyes: Negative for blurred vision.  Respiratory: Negative for cough and shortness of breath.   Cardiovascular: Positive for leg swelling. Negative for chest pain and palpitations.  Gastrointestinal: Negative for vomiting.  Musculoskeletal: Positive for joint pain. Negative for back pain.  Skin: Negative for rash.  Neurological: Negative for loss of consciousness and headaches.       Objective:    Physical Exam  Constitutional: She is oriented to person, place, and time. She appears well-developed and well-nourished. No distress.  HENT:  Head: Normocephalic and atraumatic.  Nose: Nose normal.  Eyes: Right eye exhibits no discharge. Left eye exhibits no discharge.  Neck: Normal range of motion. Neck supple.  Cardiovascular: Normal rate and regular rhythm.   No murmur  heard. Pulmonary/Chest: Effort normal and breath sounds normal.  Abdominal: Soft. Bowel sounds are normal. There is no tenderness.  Musculoskeletal: She exhibits edema.  Trace to 1+ pedal edema b/l  Neurological: She is alert and oriented to person, place, and time.  Skin: Skin is warm and dry.  Psychiatric: She has a normal mood and affect.  Nursing note and vitals reviewed.   BP 128/82 (BP Location: Left Arm, Patient Position: Sitting, Cuff Size: Normal)   Pulse 87   Temp 98.8 F (37.1 C) (Oral)   Resp 18   Wt 283 lb (128.4 kg)   SpO2 98%   BMI 44.32 kg/m  Wt Readings from Last 3 Encounters:  12/01/16 283 lb (128.4 kg)  09/22/16 290 lb 3.2 oz (131.6 kg)  09/06/16 295 lb (133.8 kg)   BP Readings from Last 3 Encounters:  12/01/16 128/82  09/22/16 108/64  09/06/16 (!) 154/80     Immunization History  Administered Date(s) Administered  . Influenza Split 03/27/2011, 03/29/2012  . Influenza Whole 04/07/2009, 03/28/2010  . Influenza,inj,Quad PF,36+ Mos  02/26/2013, 01/28/2016  . Pneumococcal Polysaccharide-23 04/03/2014  . Td 04/29/2009  . Zoster 01/28/2016    Health Maintenance  Topic Date Due  . Hepatitis C Screening  08-Aug-1955  . HIV Screening  07/23/1969  . OPHTHALMOLOGY EXAM  06/13/2016  . INFLUENZA VACCINE  12/27/2016  . PAP SMEAR  03/31/2017  . HEMOGLOBIN A1C  06/03/2017  . FOOT EXAM  07/11/2017  . MAMMOGRAM  09/13/2017  . PNEUMOCOCCAL POLYSACCHARIDE VACCINE (2) 04/04/2019  . TETANUS/TDAP  04/30/2019  . COLONOSCOPY  09/06/2021    Lab Results  Component Value Date   WBC 8.2 12/01/2016   HGB 11.4 (L) 12/01/2016   HCT 33.7 (L) 12/01/2016   PLT 235.0 12/01/2016   GLUCOSE 122 (H) 12/01/2016   CHOL 174 08/21/2016   TRIG 251.0 (H) 08/21/2016   HDL 40.20 08/21/2016   LDLDIRECT 110.0 08/21/2016   LDLCALC 99 01/21/2015   ALT 21 12/01/2016   AST 19 12/01/2016   NA 139 12/01/2016   K 4.7 12/01/2016   CL 102 12/01/2016   CREATININE 1.03 12/01/2016   BUN 20 12/01/2016   CO2 31 12/01/2016   TSH 1.01 08/21/2016   INR 2.0 (H) 06/21/2008   HGBA1C 6.6 (H) 12/01/2016    Lab Results  Component Value Date   TSH 1.01 08/21/2016   Lab Results  Component Value Date   WBC 8.2 12/01/2016   HGB 11.4 (L) 12/01/2016   HCT 33.7 (L) 12/01/2016   MCV 84.1 12/01/2016   PLT 235.0 12/01/2016   Lab Results  Component Value Date   NA 139 12/01/2016   K 4.7 12/01/2016   CO2 31 12/01/2016   GLUCOSE 122 (H) 12/01/2016   BUN 20 12/01/2016   CREATININE 1.03 12/01/2016   BILITOT 0.3 12/01/2016   ALKPHOS 77 12/01/2016   AST 19 12/01/2016   ALT 21 12/01/2016   PROT 7.2 12/01/2016   ALBUMIN 4.0 12/01/2016   CALCIUM 9.8 12/01/2016   ANIONGAP 11 11/09/2015   GFR 57.64 (L) 12/01/2016   Lab Results  Component Value Date   CHOL 174 08/21/2016   Lab Results  Component Value Date   HDL 40.20 08/21/2016   Lab Results  Component Value Date   LDLCALC 99 01/21/2015   Lab Results  Component Value Date   TRIG 251.0 (H) 08/21/2016    Lab Results  Component Value Date   CHOLHDL 4 08/21/2016   Lab Results  Component  Value Date   HGBA1C 6.6 (H) 12/01/2016         Assessment & Plan:   Problem List Items Addressed This Visit    Vitamin D deficiency    Check level      Relevant Orders   VITAMIN D 25 Hydroxy (Vit-D Deficiency, Fractures) (Completed)   Essential hypertension    Well controlled, no changes to meds. Encouraged heart healthy diet such as the DASH diet and exercise as tolerated.       Relevant Orders   CBC (Completed)   Comprehensive metabolic panel (Completed)   Edema    Right greater than left. With calf pain but ultrasound is negative for DVT. Encouraged to elevate feet, minimize sodium try compression hose.      Obesity    adovcare is a shake and vitamin regimen that is helping her metabolism and has helped her loose weight. Encouraged bariatric referral      Ear canal dryness    Vs otitis externa. Given rx for Vosol HC to try      Diabetes mellitus, type 2 ([place])    hgba1c acceptable, minimize simple carbs. Increase exercise as tolerated.       Relevant Orders   Hemoglobin A1c (Completed)   Hyperlipidemia    Encouraged heart healthy diet, increase exercise, avoid trans fats, consider a krill oil cap daily      Hip pain, bilateral    xrays just show mild arthritic changes. Referred to sports med for further consideration      Relevant Orders   DG HIPS BILAT WITH PELVIS 2V (Completed)    Other Visit Diagnoses    Chronic pain of right knee    -  Primary   Relevant Orders   US Venous Img Lower Unilateral Right (Completed)   DG Knee Complete 4 Views Right (Completed)   Ambulatory referral to Sports Medicine   Hyperparathyroidism ([place])       Relevant Orders   PTH, Intact and Calcium      I have discontinued Ms. Cassarino's Melatonin. I am also having her start on acetic acid-hydrocortisone. Additionally, I am having her maintain her calcium carbonate, Cinnamon, Multiple  Vitamins-Minerals (MULTIVITAMIN & MINERAL PO), Ginger, Turmeric, fluticasone, Cholecalciferol (VITAMIN D PO), cetirizine, ranitidine, EPINEPHrine, montelukast, Ferrous Fumarate-Folic Acid, albuterol, sertraline, KRILL OIL PO, Probiotic Product (PROBIOTIC ADVANCED PO), OVER THE COUNTER MEDICATION, diclofenac, traMADol, telmisartan-hydrochlorothiazide, furosemide, and metFORMIN. We will stop administering sodium chloride.  Meds ordered this encounter  Medications  . acetic acid-hydrocortisone (VOSOL-HC) OTIC solution    Sig: Place 3 drops into both ears 3 (three) times daily.    Dispense:  10 mL    Refill:  0    CMA served as scribe during this visit. History, Physical and Plan performed by medical provider. Documentation and orders reviewed and attested to.  Penni Homans, MD

## 2016-12-03 DIAGNOSIS — M2550 Pain in unspecified joint: Secondary | ICD-10-CM

## 2016-12-03 DIAGNOSIS — M25552 Pain in left hip: Secondary | ICD-10-CM

## 2016-12-03 DIAGNOSIS — M25551 Pain in right hip: Secondary | ICD-10-CM | POA: Insufficient documentation

## 2016-12-03 HISTORY — DX: Pain in unspecified joint: M25.50

## 2016-12-03 MED ORDER — HYDROCORTISONE-ACETIC ACID 1-2 % OT SOLN: 3.0000 [drp] | Freq: Three times a day (TID) | OTIC | 0 refills | Status: DC

## 2016-12-03 NOTE — Assessment & Plan Note (Signed)
Right greater than left. With calf pain but ultrasound is negative for DVT. Encouraged to elevate feet, minimize sodium try compression hose.

## 2016-12-03 NOTE — Assessment & Plan Note (Signed)
xrays just show mild arthritic changes. Referred to sports med for further consideration

## 2016-12-03 NOTE — Assessment & Plan Note (Signed)
Vs otitis externa. Given rx for Vosol HC to try

## 2016-12-04 LAB — PTH, INTACT AND CALCIUM
Calcium: 9.5 mg/dL (ref 8.6–10.4)
PTH: 48 pg/mL (ref 14–64)

## 2016-12-06 ENCOUNTER — Ambulatory Visit (INDEPENDENT_AMBULATORY_CARE_PROVIDER_SITE_OTHER): Payer: BLUE CROSS/BLUE SHIELD | Admitting: Family Medicine

## 2016-12-06 ENCOUNTER — Encounter: Payer: Self-pay | Admitting: Family Medicine

## 2016-12-06 DIAGNOSIS — M25561 Pain in right knee: Secondary | ICD-10-CM

## 2016-12-06 HISTORY — DX: Pain in right knee: M25.561

## 2016-12-06 NOTE — Patient Instructions (Signed)
Your pain is due to arthritis. These are the different medications you can take for this: Tylenol 500mg  1-2 tabs three times a day for pain. Capsaicin, aspercreme, or biofreeze topically up to four times a day may also help with pain. Some supplements that may help for arthritis: Boswellia extract, curcumin, pycnogenol Aleve 1-2 tabs twice a day with food Cortisone injections are an option. If cortisone injections do not help, there are different types of shots that may help but they take longer to take effect. It's important that you continue to stay active. Straight leg raises, knee extensions 3 sets of 10 once a day (add ankle weight if these become too easy). Consider physical therapy to strengthen muscles around the joint that hurts to take pressure off of the joint itself. Shoe inserts with good arch support may be helpful. Walker or cane if needed. Heat or ice 15 minutes at a time 3-4 times a day as needed to help with pain. Water aerobics and cycling with low resistance are the best two types of exercise for arthritis. Follow up with me in 1 month for reevaluation but call me sooner if you're struggling.

## 2016-12-06 NOTE — Progress Notes (Signed)
PCP and consultation requested by: Mosie Lukes, MD  Subjective:   HPI: Patient is a 61 y.o. female here for right knee pain.  Patient reports she's had about 3 weeks of right knee pain. Started following doing exercises in the pool - was running in place in the deep end when this started. Flared badly then. Pain level now down to 1-2/10, medial. Pain is sharp, worse with bending. Has to stand a moment before she's able to walk. Doppler u/s negative for DVT - noted bakers cyst. Radiographs showed arthritis. No skin changes, numbness.  Past Medical History:  Diagnosis Date  . Allergic rhinitis   . Allergy   . Anemia 07/17/2016  . Anxiety   . Asthma    seasonal  . Blood transfusion without reported diagnosis   . Breast cancer ([place])    right  . Bronchitis   . Colon polyps   . Depression   . Diabetes mellitus    borderline but takes metformin  . Edema   . Family history of breast cancer in first degree relative   . GERD (gastroesophageal reflux disease)   . Hyperlipidemia 08/21/2016   no meds  . Hyperparathyroidism   . Hypertension    controlled by medications  . Neuromuscular disorder (HCC)    Fibromyalgia  . Obesity   . Osteoarthritis    RA  . PONV (postoperative nausea and vomiting)    occasional  . Sleep apnea    wears cpap  . Viral meningitis   . Vitamin D deficiency     Current Outpatient Prescriptions on File Prior to Visit  Medication Sig Dispense Refill  . acetic acid-hydrocortisone (VOSOL-HC) OTIC solution Place 3 drops into both ears 3 (three) times daily. 10 mL 0  . albuterol (VENTOLIN HFA) 108 (90 Base) MCG/ACT inhaler Inhale 2 puffs into the lungs every 6 (six) hours as needed for wheezing or shortness of breath. 18 g 1  . Calcium Carbonate (CALTRATE 600) 1500 MG TABS Take 1,500 mg by mouth daily.     . cetirizine (ZYRTEC) 10 MG tablet Take 1 tablet (10 mg total) by mouth 2 (two) times daily. 60 tablet 5  . Cholecalciferol (VITAMIN D PO) Take 1  tablet by mouth daily.    . Cinnamon 500 MG capsule Take 1,000 mg by mouth daily.    . diclofenac (VOLTAREN) 75 MG EC tablet Take 1 tablet (75 mg total) by mouth 2 (two) times daily. 20 tablet 0  . EPINEPHrine 0.3 mg/0.3 mL IJ SOAJ injection Inject 0.3 mLs (0.3 mg total) into the muscle once as needed (Throat or facial swelling or difficulty breathing). 1 Device 0  . Ferrous Fumarate-Folic Acid (HEMOCYTE-F) 324-1 MG TABS Take 1 tablet by mouth daily. 30 each 6  . fluticasone (FLONASE) 50 MCG/ACT nasal spray Place 2 sprays into both nostrils daily. (Patient taking differently: Place 2 sprays into both nostrils daily as needed for allergies. ) 16 g 1  . furosemide (LASIX) 40 MG tablet TAKE ONE TABLET BY MOUTH TWICE DAILY 60 tablet 3  . Ginger 500 MG CAPS Take 500 mg by mouth daily.     Marland Kitchen KRILL OIL PO Take 1 capsule by mouth daily.    . metFORMIN (GLUCOPHAGE) 500 MG tablet Take 1 tablet (500 mg total) by mouth 2 (two) times daily with a meal. 180 tablet 0  . montelukast (SINGULAIR) 10 MG tablet Take 1 tablet (10 mg total) by mouth at bedtime. 30 tablet 5  . Multiple Vitamins-Minerals (  MULTIVITAMIN & MINERAL PO) Take 1 tablet by mouth daily.    Marland Kitchen OVER THE COUNTER MEDICATION Apple cider vinegar tablets 3 once a day    . Probiotic Product (PROBIOTIC ADVANCED PO) Take 1 capsule by mouth daily.    . ranitidine (ZANTAC) 150 MG tablet Take 1 tablet (150 mg total) by mouth 2 (two) times daily. 60 tablet 5  . sertraline (ZOLOFT) 25 MG tablet Take 1 tablet (25 mg total) by mouth daily. 90 tablet 0  . telmisartan-hydrochlorothiazide (MICARDIS HCT) 80-25 MG tablet TAKE ONE TABLET BY MOUTH ONCE DAILY 90 tablet 1  . traMADol (ULTRAM) 50 MG tablet Take 1 tablet (50 mg total) by mouth every 8 (eight) hours as needed. 12 tablet 0  . Turmeric 500 MG CAPS Take 500 mg by mouth daily.      No current facility-administered medications on file prior to visit.     Past Surgical History:  Procedure Laterality Date  .  ABDOMINAL HYSTERECTOMY  1998  . ADENOIDECTOMY    . BREAST EXCISIONAL BIOPSY Left 1993   benign  . BREAST SURGERY     Tran flap due to breast cancer  . CESAREAN SECTION  1988  . COLONOSCOPY  08/14/2011  . HAND SURGERY     dog bite, right hand  . HAND SURGERY     trauma, left hand  . KNEE ARTHROSCOPY     bilateral  . left knee replacement  2010  . MASTECTOMY  01/13/94   Right breast  . parathyroid resection    . PARATHYROIDECTOMY    . POLYPECTOMY    . rectal abscess    . SEPTOPLASTY  1980  . SHOULDER ARTHROSCOPY Right 11/09/2015   Procedure: ARTHROSCOPY SHOULDER-acromioplasty, distal clavicle resection and debridement;  Surgeon: Melrose Nakayama, MD;  Location: [facility];  Service: Orthopedics;  Laterality: Right;  . shoulder arthroscopy     rotator cuff repair  . TOE SURGERY Right    paronychia and adenoma removed  . TONSILLECTOMY    . TOTAL KNEE ARTHROPLASTY  06/18/08   [facility]  . TUMOR REMOVAL  1982   , scalp    Allergies  Allergen Reactions  . Mucinex [Guaifenesin Er] Shortness Of Breath  . Penicillins Hives and Other (See Comments)    Has patient had a PCN reaction causing immediate rash, facial/tongue/throat swelling, SOB or lightheadedness with hypotension: no Has patient had a PCN reaction causing severe rash involving mucus membranes or skin necrosis: no Has patient had a PCN reaction that required hospitalization no Has patient had a PCN reaction occurring within the last 10 years: no If all of the above answers are "NO", then may proceed with Cephalosporin use.   . Prozac [Fluoxetine Hcl] Hives  . Lexapro [Escitalopram Oxalate]     Nausea and hypersalivation.     Social History   Social History  . Marital status: Widowed    Spouse name: N/A  . Number of children: 1  . Years of education: N/A   Occupational History  . [employer] History Main Topics  . Smoking status: Never Smoker  . Smokeless tobacco: Never Used  . Alcohol use  Yes     Comment: occ.  . Drug use: No  . Sexual activity: Not on file   Other Topics Concern  . Not on file   Social History Narrative  . No narrative on file    Family History  Problem Relation Age of Onset  . Emphysema Father   .  Lung cancer Father        lung ca dx 66  . Other Father        prostate issues  . Breast cancer Maternal Aunt        dx late 31s  . Colon cancer Maternal Aunt        dx 76s  . Colon polyps Maternal Aunt   . Prostate cancer Maternal Grandfather 85       d. 85y  . Heart disease Paternal Grandfather   . Heart attack Paternal Grandfather        d. 50y  . Diabetes Paternal Grandfather   . Asthma Daughter        "seasonal"  . Arthritis Maternal Grandmother   . Aneurysm Maternal Grandmother        d. brain aneurysm at 7  . Arthritis Mother   . Colon polyps Mother   . Multiple sclerosis Sister   . Breast cancer Sister 91       L IDC and DCIS; ER/PR+, Her2-  . Fibrocystic breast disease Sister   . Arthritis/Rheumatoid Sister   . Cancer Maternal Uncle        d. mouth cancer at younger age; smoker  . Other Paternal Uncle        muscle issues - couldn't walk or talk; d. 20y  . Cancer Maternal Aunt        lymphoma, dx 64s  . Ovarian cancer Maternal Aunt        dx 32s; d. late 6s  . Lung cancer Maternal Uncle        d. 7y; former smoker  . Throat cancer Cousin        maternal 1st cousin; smoker  . Breast cancer Cousin 75       maternal 1st cousin  . Other Paternal Uncle        prostate issues  . Breast cancer Cousin        paternal 1st cousin dx late 19s  . Throat cancer Cousin        maternal 1st cousin; used SL tobacco  . Prostate cancer Cousin        maternal 1st cousin dx 43s  . Esophageal cancer Neg Hx   . Rectal cancer Neg Hx   . Stomach cancer Neg Hx     BP 108/70   Pulse 94   Ht '5\' 6"'  (1.676 m)   Wt 274 lb (124.3 kg)   BMI 44.22 kg/m   Review of Systems: See HPI above.     Objective:  Physical Exam:  Gen: NAD,  comfortable in exam room  Right knee: No gross deformity, ecchymoses.  Mild effusion.  No fullness posterior knee. TTP medial joint line, medial gastroc.  No other tenderness. FROM. Negative ant/post drawers. Negative valgus/varus testing. Negative lachmanns. Negative mcmurrays, apleys, thessalys, sit home, patellar apprehension. NV intact distally.  Left knee: FROM without pain.   Assessment & Plan:  1. Right knee pain - independently reviewed radiographs - arthritis noted but no other abnormalities.  No baker's cyst noted on today's [facility] ultrasound.  Pain due to arthritis and medial calf strain.  Shown home exercises to do daily.  Tylenol, topical medications, supplements discussed, aleve.  Consider injection, physical therapy.  F//u in 1 month for reevaluation.

## 2016-12-06 NOTE — Assessment & Plan Note (Signed)
independently reviewed radiographs - arthritis noted but no other abnormalities.  No baker's cyst noted on today's [facility] ultrasound.  Pain due to arthritis and medial calf strain.  Shown home exercises to do daily.  Tylenol, topical medications, supplements discussed, aleve.  Consider injection, physical therapy.  F//u in 1 month for reevaluation.

## 2016-12-11 ENCOUNTER — Ambulatory Visit: Payer: BLUE CROSS/BLUE SHIELD | Admitting: Family Medicine

## 2016-12-21 ENCOUNTER — Other Ambulatory Visit: Payer: Self-pay | Admitting: Family Medicine

## 2017-01-05 ENCOUNTER — Ambulatory Visit: Payer: BLUE CROSS/BLUE SHIELD | Admitting: Family Medicine

## 2017-01-25 ENCOUNTER — Other Ambulatory Visit: Payer: Self-pay | Admitting: Family Medicine

## 2017-01-25 DIAGNOSIS — T7840XD Allergy, unspecified, subsequent encounter: Secondary | ICD-10-CM

## 2017-01-25 DIAGNOSIS — K219 Gastro-esophageal reflux disease without esophagitis: Secondary | ICD-10-CM

## 2017-01-26 ENCOUNTER — Ambulatory Visit (INDEPENDENT_AMBULATORY_CARE_PROVIDER_SITE_OTHER): Payer: BLUE CROSS/BLUE SHIELD | Admitting: Medical

## 2017-01-26 ENCOUNTER — Encounter: Payer: Self-pay | Admitting: Medical

## 2017-01-26 VITALS — BP 112/66 | HR 88 | Temp 99.2°F | Resp 16 | Ht 66.0 in | Wt 272.2 lb

## 2017-01-26 DIAGNOSIS — M545 Low back pain, unspecified: Secondary | ICD-10-CM

## 2017-01-26 DIAGNOSIS — R1031 Right lower quadrant pain: Secondary | ICD-10-CM

## 2017-01-26 LAB — CBC WITH DIFFERENTIAL/PLATELET
Basophils Absolute: 0 10*3/uL (ref 0.0–0.1)
Basophils Relative: 0.5 % (ref 0.0–3.0)
Eosinophils Absolute: 0.1 10*3/uL (ref 0.0–0.7)
Eosinophils Relative: 1.3 % (ref 0.0–5.0)
HCT: 36.8 % (ref 36.0–46.0)
Hemoglobin: 11.9 g/dL — ABNORMAL LOW (ref 12.0–15.0)
Lymphocytes Relative: 30 % (ref 12.0–46.0)
Lymphs Abs: 2.5 10*3/uL (ref 0.7–4.0)
MCHC: 32.3 g/dL (ref 30.0–36.0)
MCV: 86.3 fl (ref 78.0–100.0)
Monocytes Absolute: 0.8 10*3/uL (ref 0.1–1.0)
Monocytes Relative: 9.4 % (ref 3.0–12.0)
Neutro Abs: 4.9 10*3/uL (ref 1.4–7.7)
Neutrophils Relative %: 58.8 % (ref 43.0–77.0)
Platelets: 244 10*3/uL (ref 150.0–400.0)
RBC: 4.26 Mil/uL (ref 3.87–5.11)
RDW: 15.8 % — ABNORMAL HIGH (ref 11.5–15.5)
WBC: 8.3 10*3/uL (ref 4.0–10.5)

## 2017-01-26 LAB — POC URINALSYSI DIPSTICK (AUTOMATED)
Bilirubin, UA: NEGATIVE
Blood, UA: NEGATIVE
Glucose, UA: NEGATIVE
Ketones, UA: NEGATIVE
Leukocytes, UA: NEGATIVE
Nitrite, UA: NEGATIVE
Protein, UA: NEGATIVE
Spec Grav, UA: >=1.03 — AB (ref 1.010–1.025)
Urobilinogen, UA: NEGATIVE E.U./dL — AB
pH, UA: 6 (ref 5.0–8.0)

## 2017-01-26 MED ORDER — KETOROLAC TROMETHAMINE 60 MG/2ML IM SOLN
60.0000 mg | Freq: Once | INTRAMUSCULAR | Status: AC
  Administered 2017-01-26: 60 mg via INTRAMUSCULAR

## 2017-01-26 MED ORDER — DICLOFENAC SODIUM 75 MG PO TBEC: 75.0000 mg | DELAYED_RELEASE_TABLET | Freq: Two times a day (BID) | ORAL | 0 refills | Status: DC

## 2017-01-26 MED ORDER — HYDROCODONE-ACETAMINOPHEN 5-325 MG PO TABS: 1.0000 | ORAL_TABLET | Freq: Four times a day (QID) | ORAL | 0 refills | Status: DC | PRN

## 2017-01-26 NOTE — Progress Notes (Signed)
Subjective:    Patient ID: [name], female    DOB: 03/04/1956, 61 y.o.   MRN: [id number]  HPI  Pt states she was laying back in a recliner. She sat up quickly and felt symmetric pain in her lower abdomen area. Pain was high level when sat up. Pain was moderate to severe all night. She also states last night her back hurt when she got out of recliner as well. She found some oxycodone from prior shoulder surgery and took tablet last night so she could sleep. Now her pain is persisting some. At rest not much pain but if she twists or bends pain is increased.   Pt has no radicular pain from her back. No leg weakness. No saddle anesthesia.   No uti type symptoms on review.   In April some rt lower quadrant pain but no injury event.   Hx of neuropathy and pcp reports gabapentin causes feet swelling.    Review of Systems  Constitutional: Positive for fatigue. Negative for chills and fever.       Faint  HENT: Negative for congestion, hearing loss, nosebleeds, postnasal drip, rhinorrhea and sinus pain.   Respiratory: Negative for cough, chest tightness, shortness of breath and wheezing.   Cardiovascular: Negative for chest pain and palpitations.  Gastrointestinal: Positive for abdominal pain. Negative for blood in stool, constipation, diarrhea, nausea and vomiting.       Right lower quadrant site and onset.  Genitourinary: Negative for difficulty urinating, enuresis, flank pain, frequency, pelvic pain and urgency.  Musculoskeletal: Negative for back pain, joint swelling and myalgias.  Skin: Negative for color change and rash.  Neurological: Negative for dizziness, seizures, speech difficulty, weakness, numbness and headaches.       Neuropathy hx. Side effect feet swelling. afriad to take lyrica due to weight gain.  Hematological: Negative for adenopathy. Does not bruise/bleed easily.  Psychiatric/Behavioral: Negative for behavioral problems, confusion, decreased concentration,  dysphoric mood and sleep disturbance. The patient is not nervous/anxious.        Some hx of insomnia. various meds used but did not help. Pt thinks never tried trazadone.   Past Medical History:  Diagnosis Date  . Allergic rhinitis   . Allergy   . Anemia 07/17/2016  . Anxiety   . Asthma    seasonal  . Blood transfusion without reported diagnosis   . Breast cancer ([place])    right  . Bronchitis   . Colon polyps   . Depression   . Diabetes mellitus    borderline but takes metformin  . Edema   . Family history of breast cancer in first degree relative   . GERD (gastroesophageal reflux disease)   . Hyperlipidemia 08/21/2016   no meds  . Hyperparathyroidism   . Hypertension    controlled by medications  . Neuromuscular disorder ([facility]CC)    Fibromyalgia  . Obesity   . Osteoarthritis    RA  . PONV (postoperative nausea and vomiting)    occasional  . Sleep apnea    wears cpap  . Viral meningitis   . Vitamin D deficiency      Social History   Social History  . Marital status: Widowed    Spouse name: N/A  . Number of children: 1  . Years of education: N/A   Occupational History  . [employer] History Main Topics  . Smoking status: Never Smoker  . Smokeless tobacco: Never Used  . Alcohol use Yes  Comment: occ.  . Drug use: No  . Sexual activity: Not on file   Other Topics Concern  . Not on file   Social History Narrative  . No narrative on file    Past Surgical History:  Procedure Laterality Date  . ABDOMINAL HYSTERECTOMY  1998  . ADENOIDECTOMY    . BREAST EXCISIONAL BIOPSY Left 1993   benign  . BREAST SURGERY     Tran flap due to breast cancer  . CESAREAN SECTION  1988  . COLONOSCOPY  08/14/2011  . HAND SURGERY     dog bite, right hand  . HAND SURGERY     trauma, left hand  . KNEE ARTHROSCOPY     bilateral  . left knee replacement  2010  . MASTECTOMY  01/13/94   Right breast  . parathyroid resection    . PARATHYROIDECTOMY     . POLYPECTOMY    . rectal abscess    . SEPTOPLASTY  1980  . SHOULDER ARTHROSCOPY Right 11/09/2015   Procedure: ARTHROSCOPY SHOULDER-acromioplasty, distal clavicle resection and debridement;  Surgeon: Melrose Nakayama, MD;  Location: [facility];  Service: Orthopedics;  Laterality: Right;  . shoulder arthroscopy     rotator cuff repair  . TOE SURGERY Right    paronychia and adenoma removed  . TONSILLECTOMY    . TOTAL KNEE ARTHROPLASTY  06/18/08   [facility]  . TUMOR REMOVAL  1982   , scalp    Family History  Problem Relation Age of Onset  . Emphysema Father   . Lung cancer Father        lung ca dx 54  . Other Father        prostate issues  . Breast cancer Maternal Aunt        dx late 71s  . Colon cancer Maternal Aunt        dx 45s  . Colon polyps Maternal Aunt   . Prostate cancer Maternal Grandfather 85       d. 85y  . Heart disease Paternal Grandfather   . Heart attack Paternal Grandfather        d. 50y  . Diabetes Paternal Grandfather   . Asthma Daughter        "seasonal"  . Arthritis Maternal Grandmother   . Aneurysm Maternal Grandmother        d. brain aneurysm at 78  . Arthritis Mother   . Colon polyps Mother   . Multiple sclerosis Sister   . Breast cancer Sister 76       L IDC and DCIS; ER/PR+, Her2-  . Fibrocystic breast disease Sister   . Arthritis/Rheumatoid Sister   . Cancer Maternal Uncle        d. mouth cancer at younger age; smoker  . Other Paternal Uncle        muscle issues - couldn't walk or talk; d. 20y  . Cancer Maternal Aunt        lymphoma, dx 41s  . Ovarian cancer Maternal Aunt        dx 53s; d. late 33s  . Lung cancer Maternal Uncle        d. 43y; former smoker  . Throat cancer Cousin        maternal 1st cousin; smoker  . Breast cancer Cousin 87       maternal 1st cousin  . Other Paternal Uncle        prostate issues  . Breast cancer Cousin  paternal 1st cousin dx late 57s  . Throat cancer Cousin        maternal 1st cousin; used SL  tobacco  . Prostate cancer Cousin        maternal 1st cousin dx 80s  . Esophageal cancer Neg Hx   . Rectal cancer Neg Hx   . Stomach cancer Neg Hx     Allergies  Allergen Reactions  . Mucinex [Guaifenesin Er] Shortness Of Breath  . Penicillins Hives and Other (See Comments)    Has patient had a PCN reaction causing immediate rash, facial/tongue/throat swelling, SOB or lightheadedness with hypotension: no Has patient had a PCN reaction causing severe rash involving mucus membranes or skin necrosis: no Has patient had a PCN reaction that required hospitalization no Has patient had a PCN reaction occurring within the last 10 years: no If all of the above answers are "NO", then may proceed with Cephalosporin use.   . Prozac [Fluoxetine Hcl] Hives  . Lexapro [Escitalopram Oxalate]     Nausea and hypersalivation.     Current Outpatient Prescriptions on File Prior to Visit  Medication Sig Dispense Refill  . acetic acid-hydrocortisone (VOSOL-HC) OTIC solution Place 3 drops into both ears 3 (three) times daily. 10 mL 0  . albuterol (VENTOLIN HFA) 108 (90 Base) MCG/ACT inhaler Inhale 2 puffs into the lungs every 6 (six) hours as needed for wheezing or shortness of breath. 18 g 1  . Calcium Carbonate (CALTRATE 600) 1500 MG TABS Take 1,500 mg by mouth daily.     . cetirizine (ZYRTEC) 10 MG tablet TAKE ONE TABLET BY MOUTH TWICE DAILY 60 tablet 5  . Cholecalciferol (VITAMIN D PO) Take 1 tablet by mouth daily.    . Cinnamon 500 MG capsule Take 1,000 mg by mouth daily.    . clindamycin (CLEOCIN) 150 MG capsule     . diclofenac (VOLTAREN) 75 MG EC tablet Take 1 tablet (75 mg total) by mouth 2 (two) times daily. 20 tablet 0  . EPINEPHrine 0.3 mg/0.3 mL IJ SOAJ injection Inject 0.3 mLs (0.3 mg total) into the muscle once as needed (Throat or facial swelling or difficulty breathing). 1 Device 0  . Ferrous Fumarate-Folic Acid (HEMOCYTE-F) 324-1 MG TABS Take 1 tablet by mouth daily. 30 each 6  .  fluticasone (FLONASE) 50 MCG/ACT nasal spray Place 2 sprays into both nostrils daily. (Patient taking differently: Place 2 sprays into both nostrils daily as needed for allergies. ) 16 g 1  . furosemide (LASIX) 40 MG tablet TAKE ONE TABLET BY MOUTH TWICE DAILY 60 tablet 3  . Ginger 500 MG CAPS Take 500 mg by mouth daily.     Marland Kitchen KRILL OIL PO Take 1 capsule by mouth daily.    . metFORMIN (GLUCOPHAGE) 500 MG tablet Take 1 tablet (500 mg total) by mouth 2 (two) times daily with a meal. 180 tablet 0  . montelukast (SINGULAIR) 10 MG tablet Take 1 tablet (10 mg total) by mouth at bedtime. 30 tablet 5  . Multiple Vitamins-Minerals (MULTIVITAMIN & MINERAL PO) Take 1 tablet by mouth daily.    Marland Kitchen OVER THE COUNTER MEDICATION Apple cider vinegar tablets 3 once a day    . Probiotic Product (PROBIOTIC ADVANCED PO) Take 1 capsule by mouth daily.    . ranitidine (ZANTAC) 150 MG tablet TAKE ONE TABLET BY MOUTH TWICE DAILY 60 tablet 5  . sertraline (ZOLOFT) 25 MG tablet TAKE 1 TABLET BY MOUTH ONCE DAILY 90 tablet 0  . telmisartan-hydrochlorothiazide (MICARDIS  HCT) 80-25 MG tablet TAKE ONE TABLET BY MOUTH ONCE DAILY 90 tablet 1  . traMADol (ULTRAM) 50 MG tablet Take 1 tablet (50 mg total) by mouth every 8 (eight) hours as needed. 12 tablet 0  . Turmeric 500 MG CAPS Take 500 mg by mouth daily.      No current facility-administered medications on file prior to visit.     BP 112/66   Pulse 88   Temp 99.2 F (37.3 C) (Oral)   Resp 16   Ht _0  (1.676 m)   Wt 272 lb 3.2 oz (123.5 kg)   SpO2 98%   BMI 43.93 kg/m       Objective:   Physical Exam  General Mental Status- Alert. General Appearance- Not in acute distress.   Skin General: Color- Normal Color. Moisture- Normal Moisture.  Neck Carotid Arteries- Normal color. Moisture- Normal Moisture. No carotid bruits. No JVD.  Chest and Lung Exam Auscultation: Breath Sounds:-Normal.  Cardiovascular Auscultation:Rythm- Regular. Murmurs & Other Heart  Sounds:Auscultation of the heart reveals- No Murmurs.  Abdomen Inspection:-Inspeection Normal. Palpation/Percussion:Note:No mass. Palpation and Percussion of the abdomen reveal- Mild direct tenderness to palpation in the right lower quadrant region pain (but also some directly in the anterior superior iliac spine area., Non Distended + BS, no rebound or guarding. On straight leg lift exam no bulge felt in the right groin or left groin region.  Right hip-no pain directly on palpation of the right hip. But on range of motion some pain in the right anterior superior iliac spine region.   Neurologic Cranial Nerve exam:- CN III-XII intact(No nystagmus), symmetric smile. Strength:- 5/5 equal and symmetric strength both upper and lower extremities.  Back-mild lumbosacral tenderness to palpation Lower extremities normal L5-S1 sensation. Normal 5 out of 5 bilateral symmetric strength.     Assessment & Plan:  Your recent bilateral lower abdomen region pain occurred while sitting up from a reclined position. This would support a diagnosis of strain in the rectus abdominal muscles. We give Toradol 60 mg intramuscular injection today. I want you to start diclofenac tomorrow afternoon. I am making a prescription of Norco available. Use if pain not controlled with anti-inflammatory. You have some oxycodone at home but please do not use this while you are using the Norco.  Since the pain is more prominent on the right side and adjacent to the appendix area, I do want you to be aware of your pain. Therefore Norco would be a better option. You had some pain on the right lower abdomen area in April but had negative CT of the abdomen. Discussion of classic appendicitis presentation today. But also explained sometimes can have atypical presentation. If you have any fevers, chills, loss appetite and increase of right lower abdomen region pain then be seen at the emergency department. Also if any excruciating increase  of right lower quadrant pain by itself also recommend ED evaluation.  Please get CBC today to evaluate infection fighting cells.  Follow up on Wednesday or as needed. Avoid any movements that would increase intra-abdominal pressure  [name], [facility]ercell Miller, [place]

## 2017-01-26 NOTE — Patient Instructions (Signed)
Your recent bilateral lower abdomen region pain occurred while sitting up from a reclined position. This would support a diagnosis of strain in the rectus abdominal muscles. We give Toradol 60 mg intramuscular injection today. I want you to start diclofenac tomorrow afternoon. I am making a prescription of Norco available. Use if pain not controlled with anti-inflammatory. You have some oxycodone at home but please do not use this while you are using the Norco.  Since the pain is more prominent on the right side and adjacent to the appendix area, I do want you to be aware of your pain. Therefore Norco would be a better option. You had some pain on the right lower abdomen area in April but had negative CT of the abdomen. Discussion of classic appendicitis presentation today. But also explained sometimes can have atypical presentation. If you have any fevers, chills, loss appetite and increase of right lower abdomen region pain then be seen at the emergency department. Also if any excruciating increase of right lower quadrant pain by itself also recommend ED evaluation.  Please get CBC today to evaluate infection fighting cells.  Follow up on Wednesday or as needed. Avoid any movements that would increase intra-abdominal pressure.

## 2017-02-19 ENCOUNTER — Other Ambulatory Visit: Payer: Self-pay | Admitting: Family Medicine

## 2017-03-05 ENCOUNTER — Ambulatory Visit (INDEPENDENT_AMBULATORY_CARE_PROVIDER_SITE_OTHER): Payer: BLUE CROSS/BLUE SHIELD | Admitting: Family Medicine

## 2017-03-05 ENCOUNTER — Encounter: Payer: Self-pay | Admitting: Family Medicine

## 2017-03-05 DIAGNOSIS — Z23 Encounter for immunization: Secondary | ICD-10-CM | POA: Diagnosis not present

## 2017-03-05 DIAGNOSIS — E782 Mixed hyperlipidemia: Secondary | ICD-10-CM

## 2017-03-05 DIAGNOSIS — E6609 Other obesity due to excess calories: Secondary | ICD-10-CM

## 2017-03-05 DIAGNOSIS — E559 Vitamin D deficiency, unspecified: Secondary | ICD-10-CM | POA: Diagnosis not present

## 2017-03-05 DIAGNOSIS — G4733 Obstructive sleep apnea (adult) (pediatric): Secondary | ICD-10-CM

## 2017-03-05 DIAGNOSIS — Z9989 Dependence on other enabling machines and devices: Secondary | ICD-10-CM

## 2017-03-05 DIAGNOSIS — I10 Essential (primary) hypertension: Secondary | ICD-10-CM | POA: Diagnosis not present

## 2017-03-05 DIAGNOSIS — F418 Other specified anxiety disorders: Secondary | ICD-10-CM

## 2017-03-05 DIAGNOSIS — M109 Gout, unspecified: Secondary | ICD-10-CM | POA: Diagnosis not present

## 2017-03-05 DIAGNOSIS — R739 Hyperglycemia, unspecified: Secondary | ICD-10-CM | POA: Diagnosis not present

## 2017-03-05 DIAGNOSIS — D649 Anemia, unspecified: Secondary | ICD-10-CM

## 2017-03-05 DIAGNOSIS — H9202 Otalgia, left ear: Secondary | ICD-10-CM

## 2017-03-05 DIAGNOSIS — J301 Allergic rhinitis due to pollen: Secondary | ICD-10-CM | POA: Diagnosis not present

## 2017-03-05 HISTORY — DX: Hyperglycemia, unspecified: R73.9

## 2017-03-05 LAB — LIPID PANEL
Cholesterol: 182 mg/dL (ref 0–200)
HDL: 46.1 mg/dL (ref >39.00)
LDL Cholesterol: 107 mg/dL — ABNORMAL HIGH (ref 0–99)
NonHDL: 135.88
Total CHOL/HDL Ratio: 4
Triglycerides: 145 mg/dL (ref 0.0–149.0)
VLDL: 29 mg/dL (ref 0.0–40.0)

## 2017-03-05 LAB — TSH: TSH: 0.77 u[IU]/mL (ref 0.35–4.50)

## 2017-03-05 LAB — CBC
HCT: 36.4 % (ref 36.0–46.0)
Hemoglobin: 11.9 g/dL — ABNORMAL LOW (ref 12.0–15.0)
MCHC: 32.8 g/dL (ref 30.0–36.0)
MCV: 86.5 fl (ref 78.0–100.0)
Platelets: 273 10*3/uL (ref 150.0–400.0)
RBC: 4.21 Mil/uL (ref 3.87–5.11)
RDW: 15 % (ref 11.5–15.5)
WBC: 8.6 10*3/uL (ref 4.0–10.5)

## 2017-03-05 LAB — COMPREHENSIVE METABOLIC PANEL
ALT: 17 U/L (ref 0–35)
AST: 15 U/L (ref 0–37)
Albumin: 4 g/dL (ref 3.5–5.2)
Alkaline Phosphatase: 75 U/L (ref 39–117)
BUN: 20 mg/dL (ref 6–23)
CO2: 30 mEq/L (ref 19–32)
Calcium: 9.7 mg/dL (ref 8.4–10.5)
Chloride: 100 mEq/L (ref 96–112)
Creatinine, Ser: 0.94 mg/dL (ref 0.40–1.20)
GFR: 64 mL/min (ref >60.00)
Glucose, Bld: 123 mg/dL — ABNORMAL HIGH (ref 70–99)
Potassium: 4.3 mEq/L (ref 3.5–5.1)
Sodium: 139 mEq/L (ref 135–145)
Total Bilirubin: 0.4 mg/dL (ref 0.2–1.2)
Total Protein: 7.4 g/dL (ref 6.0–8.3)

## 2017-03-05 LAB — HEMOGLOBIN A1C: Hgb A1c MFr Bld: 6.5 % (ref 4.6–6.5)

## 2017-03-05 LAB — VITAMIN D 25 HYDROXY (VIT D DEFICIENCY, FRACTURES): VITD: 49.99 ng/mL (ref 30.00–100.00)

## 2017-03-05 LAB — URIC ACID: Uric Acid, Serum: 9.2 mg/dL — ABNORMAL HIGH (ref 2.4–7.0)

## 2017-03-05 MED ORDER — ESOMEPRAZOLE MAGNESIUM 40 MG PO CPDR: 40.0000 mg | DELAYED_RELEASE_CAPSULE | Freq: Every day | ORAL | 1 refills | Status: DC

## 2017-03-05 MED ORDER — ALPRAZOLAM 0.25 MG PO TABS: 0.2500 mg | ORAL_TABLET | Freq: Two times a day (BID) | ORAL | 1 refills | Status: DC | PRN

## 2017-03-05 MED ORDER — SERTRALINE HCL 50 MG PO TABS: 50.0000 mg | ORAL_TABLET | Freq: Every day | ORAL | 3 refills | Status: DC

## 2017-03-05 MED ORDER — FAMOTIDINE 20 MG PO TABS: 20.0000 mg | ORAL_TABLET | Freq: Every day | ORAL | 0 refills | Status: DC

## 2017-03-05 MED ORDER — ALLOPURINOL 100 MG PO TABS: 100.0000 mg | ORAL_TABLET | Freq: Every day | ORAL | 1 refills | Status: DC

## 2017-03-05 NOTE — Assessment & Plan Note (Signed)
Check Uric Acid 

## 2017-03-05 NOTE — Progress Notes (Signed)
Subjective:  I acted as a Education administrator for Dr. Charlett Blake. [place], [place]  Patient ID: [name], female    DOB: 1955-07-29, 61 y.o.   MRN: [id number]  No chief complaint on file.   HPI  Patient is in today for a 3 month follow up. She is doing well today but has been under a great deal of stess. Her ailing, elderly mother is needing her care regularly she alo has a sick sister. Patient herself reports being physically well no recent febrile illness or hospitalizations. No polyuria or polydipsia. Denies CP/palp/SOB/HA/congestion/fevers/GI or GU c/o. Taking meds as prescribed  Patient Care Team: Mosie Lukes, MD as PCP - General (Family Medicine)   Past Medical History:  Diagnosis Date  . Allergic rhinitis   . Allergy   . Anemia 07/17/2016  . Anxiety   . Asthma    seasonal  . Blood transfusion without reported diagnosis   . Breast cancer ([place])    right  . Bronchitis   . Colon polyps   . Depression   . Diabetes mellitus    borderline but takes metformin  . Edema   . Family history of breast cancer in first degree relative   . GERD (gastroesophageal reflux disease)   . Hyperglycemia 03/05/2017  . Hyperlipidemia 08/21/2016   no meds  . Hyperparathyroidism   . Hypertension    controlled by medications  . Neuromuscular disorder (H[facility])    Fibromyalgia  . Obesity   . Osteoarthritis    RA  . PONV (postoperative nausea and vomiting)    occasional  . Sleep apnea    wears cpap  . Viral meningitis   . Vitamin D deficiency     Past Surgical History:  Procedure Laterality Date  . ABDOMINAL HYSTERECTOMY  1998  . ADENOIDECTOMY    . BREAST EXCISIONAL BIOPSY Left 1993   benign  . BREAST SURGERY     Tran flap due to breast cancer  . CESAREAN SECTION  1988  . COLONOSCOPY  08/14/2011  . HAND SURGERY     dog bite, right hand  . HAND SURGERY     trauma, left hand  . KNEE ARTHROSCOPY     bilateral  . left knee replacement  2010  . MASTECTOMY  01/13/94   Right breast  .  parathyroid resection    . PARATHYROIDECTOMY    . POLYPECTOMY    . rectal abscess    . SEPTOPLASTY  1980  . SHOULDER ARTHROSCOPY Right 11/09/2015   Procedure: ARTHROSCOPY SHOULDER-acromioplasty, distal clavicle resection and debridement;  Surgeon: Melrose Nakayama, MD;  Location: [facility];  Service: Orthopedics;  Laterality: Right;  . shoulder arthroscopy     rotator cuff repair  . TOE SURGERY Right    paronychia and adenoma removed  . TONSILLECTOMY    . TOTAL KNEE ARTHROPLASTY  06/18/08   [facility]  . TUMOR REMOVAL  1982   , scalp    Family History  Problem Relation Age of Onset  . Emphysema Father   . Lung cancer Father        lung ca dx 83  . Other Father        prostate issues  . Breast cancer Maternal Aunt        dx late 75s  . Colon cancer Maternal Aunt        dx 23s  . Colon polyps Maternal Aunt   . Prostate cancer Maternal Grandfather 85       d. 85y  .  Heart disease Paternal Grandfather   . Heart attack Paternal Grandfather        d. 50y  . Diabetes Paternal Grandfather   . Asthma Daughter        "seasonal"  . Arthritis Maternal Grandmother   . Aneurysm Maternal Grandmother        d. brain aneurysm at 14  . Arthritis Mother   . Colon polyps Mother   . Multiple sclerosis Sister   . Breast cancer Sister 63       L IDC and DCIS; ER/PR+, Her2-  . Fibrocystic breast disease Sister   . Arthritis/Rheumatoid Sister   . Cancer Maternal Uncle        d. mouth cancer at younger age; smoker  . Other Paternal Uncle        muscle issues - couldn't walk or talk; d. 20y  . Cancer Maternal Aunt        lymphoma, dx 37s  . Ovarian cancer Maternal Aunt        dx 24s; d. late 54s  . Lung cancer Maternal Uncle        d. 77y; former smoker  . Throat cancer Cousin        maternal 1st cousin; smoker  . Breast cancer Cousin 32       maternal 1st cousin  . Other Paternal Uncle        prostate issues  . Breast cancer Cousin        paternal 1st cousin dx late 16s  . Throat cancer  Cousin        maternal 1st cousin; used SL tobacco  . Prostate cancer Cousin        maternal 1st cousin dx 45s  . Esophageal cancer Neg Hx   . Rectal cancer Neg Hx   . Stomach cancer Neg Hx     Social History   Social History  . Marital status: Widowed    Spouse name: N/A  . Number of children: 1  . Years of education: N/A   Occupational History  . [place] History Main Topics  . Smoking status: Never Smoker  . Smokeless tobacco: Never Used  . Alcohol use Yes     Comment: occ.  . Drug use: No  . Sexual activity: Not on file   Other Topics Concern  . Not on file   Social History Narrative  . No narrative on file    Outpatient Medications Prior to Visit  Medication Sig Dispense Refill  . acetic acid-hydrocortisone (VOSOL-HC) OTIC solution Place 3 drops into both ears 3 (three) times daily. 10 mL 0  . albuterol (VENTOLIN HFA) 108 (90 Base) MCG/ACT inhaler Inhale 2 puffs into the lungs every 6 (six) hours as needed for wheezing or shortness of breath. 18 g 1  . Calcium Carbonate (CALTRATE 600) 1500 MG TABS Take 1,500 mg by mouth daily.     . cetirizine (ZYRTEC) 10 MG tablet TAKE ONE TABLET BY MOUTH TWICE DAILY 60 tablet 5  . Cholecalciferol (VITAMIN D PO) Take 1 tablet by mouth daily.    . diclofenac (VOLTAREN) 75 MG EC tablet Take 1 tablet (75 mg total) by mouth 2 (two) times daily. 20 tablet 0  . diclofenac (VOLTAREN) 75 MG EC tablet Take 1 tablet (75 mg total) by mouth 2 (two) times daily. 30 tablet 0  . EPINEPHrine 0.3 mg/0.3 mL IJ SOAJ injection Inject 0.3 mLs (0.3 mg total) into the muscle once  as needed (Throat or facial swelling or difficulty breathing). 1 Device 0  . Ferrous Fumarate-Folic Acid (HEMOCYTE-F) 324-1 MG TABS Take 1 tablet by mouth daily. 30 each 6  . fluticasone (FLONASE) 50 MCG/ACT nasal spray Place 2 sprays into both nostrils daily. (Patient taking differently: Place 2 sprays into both nostrils daily as needed for allergies.  ) 16 g 1  . furosemide (LASIX) 40 MG tablet TAKE ONE TABLET BY MOUTH TWICE DAILY 60 tablet 3  . Ginger 500 MG CAPS Take 500 mg by mouth daily.     Marland Kitchen HYDROcodone-acetaminophen (NORCO) 5-325 MG tablet Take 1-2 tablets by mouth every 6 (six) hours as needed for moderate pain. 16 tablet 0  . KRILL OIL PO Take 1 capsule by mouth daily.    . metFORMIN (GLUCOPHAGE) 500 MG tablet TAKE ONE TABLET BY MOUTH TWICE DAILY WITH MEALS 180 tablet 1  . montelukast (SINGULAIR) 10 MG tablet Take 1 tablet (10 mg total) by mouth at bedtime. 30 tablet 5  . Multiple Vitamins-Minerals (MULTIVITAMIN & MINERAL PO) Take 1 tablet by mouth daily.    Marland Kitchen OVER THE COUNTER MEDICATION Apple cider vinegar tablets 3 once a day    . Probiotic Product (PROBIOTIC ADVANCED PO) Take 1 capsule by mouth daily.    . ranitidine (ZANTAC) 150 MG tablet TAKE ONE TABLET BY MOUTH TWICE DAILY 60 tablet 5  . telmisartan-hydrochlorothiazide (MICARDIS HCT) 80-25 MG tablet TAKE ONE TABLET BY MOUTH ONCE DAILY 90 tablet 1  . Turmeric 500 MG CAPS Take 500 mg by mouth daily.     . Cinnamon 500 MG capsule Take 1,000 mg by mouth daily.    . clindamycin (CLEOCIN) 150 MG capsule     . sertraline (ZOLOFT) 25 MG tablet TAKE 1 TABLET BY MOUTH ONCE DAILY 90 tablet 0   No facility-administered medications prior to visit.     Allergies  Allergen Reactions  . Mucinex [Guaifenesin Er] Shortness Of Breath  . Gabapentin Swelling  . Penicillins Hives and Other (See Comments)    Has patient had a PCN reaction causing immediate rash, facial/tongue/throat swelling, SOB or lightheadedness with hypotension: no Has patient had a PCN reaction causing severe rash involving mucus membranes or skin necrosis: no Has patient had a PCN reaction that required hospitalization no Has patient had a PCN reaction occurring within the last 10 years: no If all of the above answers are "NO", then may proceed with Cephalosporin use.   . Prozac [Fluoxetine Hcl] Hives  . Lexapro  [Escitalopram Oxalate]     Nausea and hypersalivation.     Review of Systems  Constitutional: Negative for fever and malaise/fatigue.  HENT: Negative for congestion.   Eyes: Negative for blurred vision.  Respiratory: Negative for cough and shortness of breath.   Cardiovascular: Negative for chest pain, palpitations and leg swelling.  Gastrointestinal: Negative for vomiting.  Musculoskeletal: Negative for back pain.  Skin: Negative for rash.  Neurological: Negative for loss of consciousness and headaches.  Psychiatric/Behavioral: The patient is nervous/anxious.        Objective:    Physical Exam  Constitutional: She is oriented to person, place, and time. She appears well-developed and well-nourished. No distress.  HENT:  Head: Normocephalic and atraumatic.  Eyes: Conjunctivae are normal.  Neck: Normal range of motion. No thyromegaly present.  Cardiovascular: Normal rate and regular rhythm.   Pulmonary/Chest: Effort normal and breath sounds normal. She has no wheezes.  Abdominal: Soft. Bowel sounds are normal. There is no tenderness.  Musculoskeletal:  Normal range of motion. She exhibits no edema or deformity.  Neurological: She is alert and oriented to person, place, and time. She displays normal reflexes.  Skin: Skin is warm and dry. She is not diaphoretic.  Psychiatric: She has a normal mood and affect.    There were no vitals taken for this visit. Wt Readings from Last 3 Encounters:  01/26/17 272 lb 3.2 oz (123.5 kg)  12/06/16 274 lb (124.3 kg)  12/01/16 283 lb (128.4 kg)   BP Readings from Last 3 Encounters:  01/26/17 112/66  12/06/16 108/70  12/01/16 128/82     Immunization History  Administered Date(s) Administered  . Influenza Split 03/27/2011, 03/29/2012  . Influenza Whole 04/07/2009, 03/28/2010  . Influenza,inj,Quad PF,6+ Mos 02/26/2013, 01/28/2016, 03/05/2017  . Pneumococcal Polysaccharide-23 04/03/2014  . Td 04/29/2009  . Zoster 01/28/2016    Health  Maintenance  Topic Date Due  . Hepatitis C Screening  05/01/1956  . HIV Screening  07/23/1969  . OPHTHALMOLOGY EXAM  06/13/2016  . INFLUENZA VACCINE  12/27/2016  . PAP SMEAR  03/31/2017  . HEMOGLOBIN A1C  06/03/2017  . FOOT EXAM  07/11/2017  . MAMMOGRAM  09/13/2017  . PNEUMOCOCCAL POLYSACCHARIDE VACCINE (2) 04/04/2019  . TETANUS/TDAP  04/30/2019  . COLONOSCOPY  09/06/2021    Lab Results  Component Value Date   WBC 8.6 03/05/2017   HGB 11.9 (L) 03/05/2017   HCT 36.4 03/05/2017   PLT 273.0 03/05/2017   GLUCOSE 123 (H) 03/05/2017   CHOL 182 03/05/2017   TRIG 145.0 03/05/2017   HDL 46.10 03/05/2017   LDLDIRECT 110.0 08/21/2016   LDLCALC 107 (H) 03/05/2017   ALT 17 03/05/2017   AST 15 03/05/2017   NA 139 03/05/2017   K 4.3 03/05/2017   CL 100 03/05/2017   CREATININE 0.94 03/05/2017   BUN 20 03/05/2017   CO2 30 03/05/2017   TSH 0.77 03/05/2017   INR 2.0 (H) 06/21/2008   HGBA1C 6.5 03/05/2017    Lab Results  Component Value Date   TSH 0.77 03/05/2017   Lab Results  Component Value Date   WBC 8.6 03/05/2017   HGB 11.9 (L) 03/05/2017   HCT 36.4 03/05/2017   MCV 86.5 03/05/2017   PLT 273.0 03/05/2017   Lab Results  Component Value Date   NA 139 03/05/2017   K 4.3 03/05/2017   CO2 30 03/05/2017   GLUCOSE 123 (H) 03/05/2017   BUN 20 03/05/2017   CREATININE 0.94 03/05/2017   BILITOT 0.4 03/05/2017   ALKPHOS 75 03/05/2017   AST 15 03/05/2017   ALT 17 03/05/2017   PROT 7.4 03/05/2017   ALBUMIN 4.0 03/05/2017   CALCIUM 9.7 03/05/2017   ANIONGAP 11 11/09/2015   GFR 64.00 03/05/2017   Lab Results  Component Value Date   CHOL 182 03/05/2017   Lab Results  Component Value Date   HDL 46.10 03/05/2017   Lab Results  Component Value Date   LDLCALC 107 (H) 03/05/2017   Lab Results  Component Value Date   TRIG 145.0 03/05/2017   Lab Results  Component Value Date   CHOLHDL 4 03/05/2017   Lab Results  Component Value Date   HGBA1C 6.5 03/05/2017          Assessment & Plan:   Problem List Items Addressed This Visit    Gout of big toe (Chronic)    Check Uric Acid      Relevant Medications   allopurinol (ZYLOPRIM) 100 MG tablet   Other Relevant Orders  Uric acid (Completed)   Vitamin D deficiency    Check level today      Relevant Orders   VITAMIN D 25 Hydroxy (Vit-D Deficiency, Fractures) (Completed)   Depression with anxiety    Is struggling with a great deal of stress. Will increase the Sertraline to 50 mg daily.mother with PE after back surgery. Sister with severe pain due to a hip that needs surgery but she has now been diagnosed with breast cancer.       Relevant Medications   sertraline (ZOLOFT) 50 MG tablet   ALPRAZolam (XANAX) 0.25 MG tablet   Other Relevant Orders   Ambulatory referral to [facility] hypertension    Well controlled, no changes to meds. Encouraged heart healthy diet such as the DASH diet and exercise as tolerated.       Relevant Orders   CBC (Completed)   Comprehensive metabolic panel (Completed)   TSH (Completed)   Obesity    Encouraged DASH diet, decrease po intake and increase exercise as tolerated. Needs 7-8 hours of sleep nightly. Avoid trans fats, eat small, frequent meals every 4-5 hours with lean proteins, complex carbs and healthy fats. Minimize simple carbs, bariatric referral      Obstructive sleep apnea treated with continuous positive airway pressure (CPAP)    Using CPAP      Left ear pain    Persistent itching in ear despite numerous treatments with ent      Acute seasonal allergic rhinitis due to pollen    Taking regular meds but not sure it is helping      Anemia    Increase leafy greens, consider increased lean red meat and using cast iron cookware. Continue to monitor, report any concerns      Hyperlipidemia    Encouraged heart healthy diet, increase exercise, avoid trans fats, consider a krill oil cap daily      Relevant Orders   Lipid panel  (Completed)   Hyperglycemia    hgba1c acceptable, minimize simple carbs. Increase exercise as tolerated.      Relevant Orders   Hemoglobin A1c (Completed)    Other Visit Diagnoses    Needs flu shot    -  Primary   Relevant Orders   Flu Vaccine QUAD 6+ mos PF IM (Fluarix Quad PF) (Completed)      I have discontinued Ms. Cancelliere's Cinnamon, clindamycin, and sertraline. I have also changed her famotidine. Additionally, I am having her start on sertraline, ALPRAZolam, esomeprazole, and allopurinol. Lastly, I am having her maintain her calcium carbonate, Multiple Vitamins-Minerals (MULTIVITAMIN & MINERAL PO), Ginger, Turmeric, fluticasone, Cholecalciferol (VITAMIN D PO), EPINEPHrine, montelukast, Ferrous Fumarate-Folic Acid, albuterol, KRILL OIL PO, Probiotic Product (PROBIOTIC ADVANCED PO), OVER THE COUNTER MEDICATION, diclofenac, telmisartan-hydrochlorothiazide, furosemide, acetic acid-hydrocortisone, cetirizine, ranitidine, diclofenac, HYDROcodone-acetaminophen, and metFORMIN.  Meds ordered this encounter  Medications  . DISCONTD: famotidine (PEPCID) 20 MG tablet    Sig: Take 1 tablet by mouth daily.  . famotidine (PEPCID) 20 MG tablet    Sig: Take 1 tablet (20 mg total) by mouth daily.    Dispense:  90 tablet    Refill:  0  . sertraline (ZOLOFT) 50 MG tablet    Sig: Take 1 tablet (50 mg total) by mouth daily.    Dispense:  30 tablet    Refill:  3  . ALPRAZolam (XANAX) 0.25 MG tablet    Sig: Take 1 tablet (0.25 mg total) by mouth 2 (two) times daily as needed for anxiety.  Dispense:  30 tablet    Refill:  1  . esomeprazole (NEXIUM) 40 MG capsule    Sig: Take 1 capsule (40 mg total) by mouth daily.    Dispense:  90 capsule    Refill:  1  . allopurinol (ZYLOPRIM) 100 MG tablet    Sig: Take 1 tablet (100 mg total) by mouth daily.    Dispense:  90 tablet    Refill:  1    CMA served as Education administrator during this visit. History, Physical and Plan performed by medical provider.  Documentation and orders reviewed and attested to.  Penni Homans, MD

## 2017-03-05 NOTE — Assessment & Plan Note (Signed)
Increase leafy greens, consider increased lean red meat and using cast iron cookware. Continue to monitor, report any concerns 

## 2017-03-05 NOTE — Assessment & Plan Note (Signed)
Using CPAP 

## 2017-03-05 NOTE — Assessment & Plan Note (Signed)
hgba1c acceptable, minimize simple carbs. Increase exercise as tolerated.  

## 2017-03-05 NOTE — Assessment & Plan Note (Signed)
Encouraged heart healthy diet, increase exercise, avoid trans fats, consider a krill oil cap daily 

## 2017-03-05 NOTE — Assessment & Plan Note (Signed)
Encouraged DASH diet, decrease po intake and increase exercise as tolerated. Needs 7-8 hours of sleep nightly. Avoid trans fats, eat small, frequent meals every 4-5 hours with lean proteins, complex carbs and healthy fats. Minimize simple carbs, bariatric referral 

## 2017-03-05 NOTE — Assessment & Plan Note (Addendum)
Is struggling with a great deal of stress. Will increase the Sertraline to 50 mg daily.mother with PE after back surgery. Sister with severe pain due to a hip that needs surgery but she has now been diagnosed with breast cancer.

## 2017-03-05 NOTE — Assessment & Plan Note (Signed)
Persistent itching in ear despite numerous treatments with ent

## 2017-03-05 NOTE — Assessment & Plan Note (Signed)
Well controlled, no changes to meds. Encouraged heart healthy diet such as the DASH diet and exercise as tolerated.  °

## 2017-03-05 NOTE — Assessment & Plan Note (Signed)
Taking regular meds but not sure it is helping

## 2017-03-05 NOTE — Assessment & Plan Note (Signed)
Check level today 

## 2017-03-05 NOTE — Patient Instructions (Signed)

## 2017-03-11 ENCOUNTER — Encounter: Payer: Self-pay | Admitting: Family Medicine

## 2017-03-13 ENCOUNTER — Telehealth: Payer: Self-pay | Admitting: Family Medicine

## 2017-03-13 NOTE — Telephone Encounter (Signed)
Patient Name: [name]  DOB: Nov 30, 1955    Initial Comment Caller states she is itching all over.   Nurse Assessment  Nurse: Raphael Gibney, RN, Vanita Ingles Date/Time (Eastern Time): 03/13/2017 10:20:18 AM  Confirm and document reason for call. If symptomatic, describe symptoms. ---Caller states she is itching all over. Has had hives before. No rash or hives. She is taking allopurinol which started last Monday. itching started on Saturday. Stopped Allopurinol on Sunday.  Does the patient have any new or worsening symptoms? ---Yes  Will a triage be completed? ---Yes  Related visit to physician within the last 2 weeks? ---No  Does the PT have any chronic conditions? (i.e. diabetes, asthma, etc.) ---Yes  List chronic conditions. ---HTN; prediabetes  Is this a behavioral health or substance abuse call? ---No     Guidelines    Guideline Title Affirmed Question Affirmed Notes  Itching - Widespread [1] MODERATE-SEVERE widespread itching (i.e., interferes with sleep, normal activities or school) AND [2] not improved after 24 hours of itching Care Advice    Final Disposition User   See Physician within 24 Hours [place], RN, Vanita Ingles    Comments  pt stopped her aloupurinol on Sunday. Wants to know if there is something else besides Alloupurinol that can be called in. Also she would like something called in for the itching rather than coming to the office. Please call pt back regarding medication.   Referrals  REFERRED TO PCP OFFICE   Caller Disagree/Comply Disagree  Caller Understands Yes  PreDisposition Call Doctor

## 2017-03-14 NOTE — Telephone Encounter (Signed)
She can start Uloric 40 mg tabs, 1 tab po qd disp #30 with 3 rf put on rx that she had an allergic rxn to allopurinol and put it in her allergy list

## 2017-03-14 NOTE — Telephone Encounter (Signed)
Team health follow up call made to patient. Patient states her hives went away but she still has some itching. States she has been to an allergist in the past who told her she was Not allergic to anything. States she did stop the Allopurinol. Advised patient that Dr. Charlett Blake didprescribe medication for her pruritis. Called in to patients pharmacy (Hydroxyzine) patient notified. Will check with Dr. Charlett Blake regarding the replacement for Allopurinol.

## 2017-03-16 NOTE — Telephone Encounter (Signed)
Relation to OI:LNZV Call back [place]: [place], [place] [phone] (Phone) [phone] (Fax)     Reason for call:  Patient checking on the status of allopurinol (ZYLOPRIM) 100 MG tablet alternate, stating medication caused her to break out into a rash, patient states pharmacy never received.   Patient states she also requested her esomeprazole (NEXIUM) 40 MG capsule, please advise

## 2017-03-19 MED ORDER — FEBUXOSTAT 40 MG PO TABS: 40.0000 mg | ORAL_TABLET | Freq: Every day | ORAL | 3 refills | Status: DC

## 2017-03-19 NOTE — Telephone Encounter (Signed)
New medication sent to pharmacy. Nexium was sent to pharmacy 03/05/17

## 2017-03-21 ENCOUNTER — Ambulatory Visit (INDEPENDENT_AMBULATORY_CARE_PROVIDER_SITE_OTHER): Payer: BLUE CROSS/BLUE SHIELD | Admitting: Medical

## 2017-03-21 ENCOUNTER — Encounter: Payer: Self-pay | Admitting: Medical

## 2017-03-21 VITALS — BP 115/75 | HR 80 | Temp 98.6°F | Resp 16 | Ht 66.0 in | Wt 280.6 lb

## 2017-03-21 DIAGNOSIS — L989 Disorder of the skin and subcutaneous tissue, unspecified: Secondary | ICD-10-CM

## 2017-03-21 DIAGNOSIS — B070 Plantar wart: Secondary | ICD-10-CM

## 2017-03-21 NOTE — Progress Notes (Signed)
Subjective:    Patient ID: [name], female    DOB: 1955/06/15, 61 y.o.   MRN: [id number]  HPI  Pt in for recent small area on rt heel seen just recently over weekend. Area little tender with small black seed like region in center. She picked at center but no medication used otc.  She states that she dug out the center seedy region but then it grew back with the same appearance.  Pt had no prior warts.    Review of Systems  Constitutional: Negative for chills, fatigue and fever.  Respiratory: Negative for cough, chest tightness, shortness of breath and wheezing.   Cardiovascular: Negative for palpitations.  Gastrointestinal: Negative for abdominal pain.  Musculoskeletal: Negative for back pain.  Skin: Negative for rash.       See hpi  Neurological: Negative for dizziness, syncope, weakness, numbness and headaches.  Hematological: Negative for adenopathy. Does not bruise/bleed easily.  Psychiatric/Behavioral: Negative for behavioral problems and confusion.    Past Medical History:  Diagnosis Date  . Allergic rhinitis   . Allergy   . Anemia 07/17/2016  . Anxiety   . Asthma    seasonal  . Blood transfusion without reported diagnosis   . Breast cancer ([place])    right  . Bronchitis   . Colon polyps   . Depression   . Diabetes mellitus    borderline but takes metformin  . Edema   . Family history of breast cancer in first degree relative   . GERD (gastroesophageal reflux disease)   . Hyperglycemia 03/05/2017  . Hyperlipidemia 08/21/2016   no meds  . Hyperparathyroidism   . Hypertension    controlled by medications  . Neuromuscular disorder ([facility])    Fibromyalgia  . Obesity   . Osteoarthritis    RA  . PONV (postoperative nausea and vomiting)    occasional  . Sleep apnea    wears cpap  . Viral meningitis   . Vitamin D deficiency      Social History   Social History  . Marital status: Widowed    Spouse name: N/A  . Number of children: 1  . Years of  education: N/A   Occupational History  . [place] History Main Topics  . Smoking status: Never Smoker  . Smokeless tobacco: Never Used  . Alcohol use Yes     Comment: occ.  . Drug use: No  . Sexual activity: Not on file   Other Topics Concern  . Not on file   Social History Narrative  . No narrative on file    Past Surgical History:  Procedure Laterality Date  . ABDOMINAL HYSTERECTOMY  1998  . ADENOIDECTOMY    . BREAST EXCISIONAL BIOPSY Left 1993   benign  . BREAST SURGERY     Tran flap due to breast cancer  . CESAREAN SECTION  1988  . COLONOSCOPY  08/14/2011  . HAND SURGERY     dog bite, right hand  . HAND SURGERY     trauma, left hand  . KNEE ARTHROSCOPY     bilateral  . left knee replacement  2010  . MASTECTOMY  01/13/94   Right breast  . parathyroid resection    . PARATHYROIDECTOMY    . POLYPECTOMY    . rectal abscess    . SEPTOPLASTY  1980  . SHOULDER ARTHROSCOPY Right 11/09/2015   Procedure: ARTHROSCOPY SHOULDER-acromioplasty, distal clavicle resection and debridement;  Surgeon: Melrose Nakayama, MD;  Location: [facility];  Service: Orthopedics;  Laterality: Right;  . shoulder arthroscopy     rotator cuff repair  . TOE SURGERY Right    paronychia and adenoma removed  . TONSILLECTOMY    . TOTAL KNEE ARTHROPLASTY  06/18/08   Daldorf  . TUMOR REMOVAL  1982   , scalp    Family History  Problem Relation Age of Onset  . Emphysema Father   . Lung cancer Father        lung ca dx 2  . Other Father        prostate issues  . Breast cancer Maternal Aunt        dx late 71s  . Colon cancer Maternal Aunt        dx 7s  . Colon polyps Maternal Aunt   . Prostate cancer Maternal Grandfather 85       d. 85y  . Heart disease Paternal Grandfather   . Heart attack Paternal Grandfather        d. 50y  . Diabetes Paternal Grandfather   . Asthma Daughter        "seasonal"  . Arthritis Maternal Grandmother   . Aneurysm Maternal Grandmother          d. brain aneurysm at 61  . Arthritis Mother   . Colon polyps Mother   . Multiple sclerosis Sister   . Breast cancer Sister 47       L IDC and DCIS; ER/PR+, Her2-  . Fibrocystic breast disease Sister   . Arthritis/Rheumatoid Sister   . Cancer Maternal Uncle        d. mouth cancer at younger age; smoker  . Other Paternal Uncle        muscle issues - couldn't walk or talk; d. 20y  . Cancer Maternal Aunt        lymphoma, dx 36s  . Ovarian cancer Maternal Aunt        dx 36s; d. late 43s  . Lung cancer Maternal Uncle        d. 76y; former smoker  . Throat cancer Cousin        maternal 1st cousin; smoker  . Breast cancer Cousin 67       maternal 1st cousin  . Other Paternal Uncle        prostate issues  . Breast cancer Cousin        paternal 1st cousin dx late 34s  . Throat cancer Cousin        maternal 1st cousin; used SL tobacco  . Prostate cancer Cousin        maternal 1st cousin dx 56s  . Esophageal cancer Neg Hx   . Rectal cancer Neg Hx   . Stomach cancer Neg Hx     Allergies  Allergen Reactions  . Allopurinol Hives  . Mucinex [Guaifenesin Er] Shortness Of Breath  . Gabapentin Swelling  . Penicillins Hives and Other (See Comments)    Has patient had a PCN reaction causing immediate rash, facial/tongue/throat swelling, SOB or lightheadedness with hypotension: no Has patient had a PCN reaction causing severe rash involving mucus membranes or skin necrosis: no Has patient had a PCN reaction that required hospitalization no Has patient had a PCN reaction occurring within the last 10 years: no If all of the above answers are "NO", then may proceed with Cephalosporin use.   . Prozac [Fluoxetine Hcl] Hives  . Lexapro [Escitalopram Oxalate]     Nausea and hypersalivation.  Current Outpatient Prescriptions on File Prior to Visit  Medication Sig Dispense Refill  . acetic acid-hydrocortisone (VOSOL-HC) OTIC solution Place 3 drops into both ears 3 (three) times  daily. 10 mL 0  . albuterol (VENTOLIN HFA) 108 (90 Base) MCG/ACT inhaler Inhale 2 puffs into the lungs every 6 (six) hours as needed for wheezing or shortness of breath. 18 g 1  . ALPRAZolam (XANAX) 0.25 MG tablet Take 1 tablet (0.25 mg total) by mouth 2 (two) times daily as needed for anxiety. 30 tablet 1  . Calcium Carbonate (CALTRATE 600) 1500 MG TABS Take 1,500 mg by mouth daily.     . cetirizine (ZYRTEC) 10 MG tablet TAKE ONE TABLET BY MOUTH TWICE DAILY 60 tablet 5  . Cholecalciferol (VITAMIN D PO) Take 1 tablet by mouth daily.    . diclofenac (VOLTAREN) 75 MG EC tablet Take 1 tablet (75 mg total) by mouth 2 (two) times daily. 20 tablet 0  . diclofenac (VOLTAREN) 75 MG EC tablet Take 1 tablet (75 mg total) by mouth 2 (two) times daily. 30 tablet 0  . EPINEPHrine 0.3 mg/0.3 mL IJ SOAJ injection Inject 0.3 mLs (0.3 mg total) into the muscle once as needed (Throat or facial swelling or difficulty breathing). 1 Device 0  . esomeprazole (NEXIUM) 40 MG capsule Take 1 capsule (40 mg total) by mouth daily. 90 capsule 1  . famotidine (PEPCID) 20 MG tablet Take 1 tablet (20 mg total) by mouth daily. 90 tablet 0  . febuxostat (ULORIC) 40 MG tablet Take 1 tablet (40 mg total) by mouth daily. 30 tablet 3  . Ferrous Fumarate-Folic Acid (HEMOCYTE-F) 324-1 MG TABS Take 1 tablet by mouth daily. 30 each 6  . fluticasone (FLONASE) 50 MCG/ACT nasal spray Place 2 sprays into both nostrils daily. (Patient taking differently: Place 2 sprays into both nostrils daily as needed for allergies. ) 16 g 1  . furosemide (LASIX) 40 MG tablet TAKE ONE TABLET BY MOUTH TWICE DAILY 60 tablet 3  . Ginger 500 MG CAPS Take 500 mg by mouth daily.     Marland Kitchen HYDROcodone-acetaminophen (NORCO) 5-325 MG tablet Take 1-2 tablets by mouth every 6 (six) hours as needed for moderate pain. 16 tablet 0  . KRILL OIL PO Take 1 capsule by mouth daily.    . metFORMIN (GLUCOPHAGE) 500 MG tablet TAKE ONE TABLET BY MOUTH TWICE DAILY WITH MEALS 180 tablet  1  . montelukast (SINGULAIR) 10 MG tablet Take 1 tablet (10 mg total) by mouth at bedtime. 30 tablet 5  . Multiple Vitamins-Minerals (MULTIVITAMIN & MINERAL PO) Take 1 tablet by mouth daily.    Marland Kitchen OVER THE COUNTER MEDICATION Apple cider vinegar tablets 3 once a day    . Probiotic Product (PROBIOTIC ADVANCED PO) Take 1 capsule by mouth daily.    . ranitidine (ZANTAC) 150 MG tablet TAKE ONE TABLET BY MOUTH TWICE DAILY 60 tablet 5  . sertraline (ZOLOFT) 50 MG tablet Take 1 tablet (50 mg total) by mouth daily. 30 tablet 3  . telmisartan-hydrochlorothiazide (MICARDIS HCT) 80-25 MG tablet TAKE ONE TABLET BY MOUTH ONCE DAILY 90 tablet 1  . Turmeric 500 MG CAPS Take 500 mg by mouth daily.      No current facility-administered medications on file prior to visit.     BP 115/75   Pulse 80   Temp 98.6 F (37 C) (Oral)   Resp 16   Ht '5\' 6"'  (1.676 m)   Wt 280 lb 9.6 oz (127.3 kg)  SpO2 99%   BMI 45.29 kg/m       Objective:   Physical Exam  General- No acute distress. Pleasant patient. Neck- Full range of motion, no jvd Lungs- Clear, even and unlabored. Heart- regular rate and rhythm. Neurologic- CNII- XII grossly intact.  Rt foot- small circular 6 mm wart appearance on heel. Mild tenderness.      Assessment & Plan:  You do appear to have probable plantar wart.  I did cryo-freeze the area today.  1 week from now would recommend trying Compound W.  Then a 2 weeks let us reevaluate the area.  If you think it is obviously not improved let me know and I would refer you to a podiatrist for evaluation and further treatment.  Follow-up as needed.  Pt signed consent form to do procedure.

## 2017-03-21 NOTE — Patient Instructions (Signed)
You do appear to have probable plantar wart.  I did cryo-freeze the area today.  1 week from now would recommend trying Compound W.  Then a 2 weeks let us reevaluate the area.  If you think it is obviously not improved let me know and I would refer you to a podiatrist for evaluation and further treatment.  Follow-up as needed.

## 2017-04-12 ENCOUNTER — Ambulatory Visit: Payer: BLUE CROSS/BLUE SHIELD | Admitting: Psychology

## 2017-04-16 ENCOUNTER — Encounter: Payer: Self-pay | Admitting: Family Medicine

## 2017-04-22 ENCOUNTER — Other Ambulatory Visit: Payer: Self-pay | Admitting: Family Medicine

## 2017-04-22 DIAGNOSIS — L84 Corns and callosities: Secondary | ICD-10-CM

## 2017-04-23 ENCOUNTER — Encounter: Payer: Self-pay | Admitting: Family Medicine

## 2017-04-23 DIAGNOSIS — L299 Pruritus, unspecified: Secondary | ICD-10-CM

## 2017-04-24 MED ORDER — SERTRALINE HCL 50 MG PO TABS: 50.0000 mg | ORAL_TABLET | Freq: Every day | ORAL | 1 refills | Status: DC

## 2017-04-27 ENCOUNTER — Ambulatory Visit: Payer: BLUE CROSS/BLUE SHIELD | Admitting: Internal Medicine

## 2017-04-27 ENCOUNTER — Encounter: Payer: Self-pay | Admitting: Internal Medicine

## 2017-04-27 VITALS — BP 128/80 | HR 80 | Temp 98.4°F | Resp 14 | Ht 66.0 in | Wt 280.1 lb

## 2017-04-27 DIAGNOSIS — G629 Polyneuropathy, unspecified: Secondary | ICD-10-CM | POA: Diagnosis not present

## 2017-04-27 MED ORDER — GABAPENTIN 300 MG PO CAPS: 300.0000 mg | ORAL_CAPSULE | Freq: Every day | ORAL | 3 refills | Status: DC

## 2017-04-27 NOTE — Progress Notes (Signed)
Subjective:    Patient ID: [name], female    DOB: 1955/06/21, 61 y.o.   MRN: [id number]  DOS:  04/27/2017 Type of visit - description : acute, neuropathy Interval history: Symptoms started about 2 years ago with a burning feeling at the feet, symmetric, worse at the plantar area going up until mid to food. Symptoms are worse at night. 2 years ago was prescribed gabapentin at another office, she had to stop it because she developed some swelling.  At that time did not have any real allergic reaction such as redness, itching etc. Symptoms are getting progressively worse and like something done   Review of Systems Denies any significant back pain.  No rash or itching of the lower extremities No lower extremity edema or claudication  Past Medical History:  Diagnosis Date  . Allergic rhinitis   . Allergy   . Anemia 07/17/2016  . Anxiety   . Asthma    seasonal  . Blood transfusion without reported diagnosis   . Breast cancer ([place])    right  . Bronchitis   . Colon polyps   . Depression   . Diabetes mellitus    borderline but takes metformin  . Edema   . Family history of breast cancer in first degree relative   . GERD (gastroesophageal reflux disease)   . Hyperglycemia 03/05/2017  . Hyperlipidemia 08/21/2016   no meds  . Hyperparathyroidism   . Hypertension    controlled by medications  . Neuromuscular disorder ([facility])    Fibromyalgia  . Obesity   . Osteoarthritis    RA  . PONV (postoperative nausea and vomiting)    occasional  . Sleep apnea    wears cpap  . Viral meningitis   . Vitamin D deficiency     Past Surgical History:  Procedure Laterality Date  . ABDOMINAL HYSTERECTOMY  1998  . ADENOIDECTOMY    . BREAST EXCISIONAL BIOPSY Left 1993   benign  . BREAST SURGERY     Tran flap due to breast cancer  . CESAREAN SECTION  1988  . COLONOSCOPY  08/14/2011  . HAND SURGERY     dog bite, right hand  . HAND SURGERY     trauma, left hand  . KNEE ARTHROSCOPY      bilateral  . left knee replacement  2010  . MASTECTOMY  01/13/94   Right breast  . parathyroid resection    . PARATHYROIDECTOMY    . POLYPECTOMY    . rectal abscess    . SEPTOPLASTY  1980  . SHOULDER ARTHROSCOPY Right 11/09/2015   Procedure: ARTHROSCOPY SHOULDER-acromioplasty, distal clavicle resection and debridement;  Surgeon: Melrose Nakayama, MD;  Location: [facility];  Service: Orthopedics;  Laterality: Right;  . shoulder arthroscopy     rotator cuff repair  . TOE SURGERY Right    paronychia and adenoma removed  . TONSILLECTOMY    . TOTAL KNEE ARTHROPLASTY  06/18/08   Daldorf  . TUMOR REMOVAL  1982   , scalp    Social History   Socioeconomic History  . Marital status: Widowed    Spouse name: Not on file  . Number of children: 1  . Years of education: Not on file  . Highest education level: Not on file  Social Needs  . Financial resource strain: Not on file  . Food insecurity - worry: Not on file  . Food insecurity - inability: Not on file  . Transportation needs - medical: Not on file  .  Transportation needs - non-medical: Not on file  Occupational History  . Occupation: ACCOUNTING    Employer: [employer]  Tobacco Use  . Smoking status: Never Smoker  . Smokeless tobacco: Never Used  Substance and Sexual Activity  . Alcohol use: Yes    Comment: occ.  . Drug use: No  . Sexual activity: Not on file  Other Topics Concern  . Not on file  Social History Narrative  . Not on file      Allergies as of 04/27/2017      Reactions   Allopurinol Hives   Mucinex [guaifenesin Er] Shortness Of Breath   Penicillins Hives, Other (See Comments)   Has patient had a PCN reaction causing immediate rash, facial/tongue/throat swelling, SOB or lightheadedness with hypotension: no Has patient had a PCN reaction causing severe rash involving mucus membranes or skin necrosis: no Has patient had a PCN reaction that required hospitalization no Has patient had a PCN reaction  occurring within the last 10 years: no If all of the above answers are "NO", then may proceed with Cephalosporin use.   Prozac [fluoxetine Hcl] Hives   Lexapro [escitalopram Oxalate]    Nausea and hypersalivation.       Medication List        Accurate as of 04/27/17 11:59 PM. Always use your most recent med list.          acetic acid-hydrocortisone OTIC solution Commonly known as:  VOSOL-HC Place 3 drops into both ears 3 (three) times daily.   albuterol 108 (90 Base) MCG/ACT inhaler Commonly known as:  VENTOLIN HFA Inhale 2 puffs into the lungs every 6 (six) hours as needed for wheezing or shortness of breath.   ALPRAZolam 0.25 MG tablet Commonly known as:  XANAX Take 1 tablet (0.25 mg total) by mouth 2 (two) times daily as needed for anxiety.   CALTRATE 600 1500 (600 Ca) MG Tabs tablet Generic drug:  calcium carbonate Take 1,500 mg by mouth daily.   cetirizine 10 MG tablet Commonly known as:  ZYRTEC TAKE ONE TABLET BY MOUTH TWICE DAILY   diclofenac 75 MG EC tablet Commonly known as:  VOLTAREN Take 1 tablet (75 mg total) by mouth 2 (two) times daily.   EPINEPHrine 0.3 mg/0.3 mL Soaj injection Commonly known as:  EPI-PEN Inject 0.3 mLs (0.3 mg total) into the muscle once as needed (Throat or facial swelling or difficulty breathing).   esomeprazole 40 MG capsule Commonly known as:  NEXIUM Take 1 capsule (40 mg total) by mouth daily.   famotidine 20 MG tablet Commonly known as:  PEPCID Take 1 tablet (20 mg total) by mouth daily.   febuxostat 40 MG tablet Commonly known as:  ULORIC Take 1 tablet (40 mg total) by mouth daily.   Ferrous Fumarate-Folic Acid 017-5 MG Tabs Commonly known as:  HEMOCYTE-F Take 1 tablet by mouth daily.   fluocinonide 0.05 % external solution Commonly known as:  LIDEX As directed   fluticasone 50 MCG/ACT nasal spray Commonly known as:  FLONASE Place 2 sprays into both nostrils daily.   furosemide 40 MG tablet Commonly known as:   LASIX TAKE ONE TABLET BY MOUTH TWICE DAILY   gabapentin 300 MG capsule Commonly known as:  NEURONTIN Take 1-2 capsules (300-600 mg total) by mouth at bedtime.   Ginger 500 MG Caps Take 500 mg by mouth daily.   HYDROcodone-acetaminophen 5-325 MG tablet Commonly known as:  NORCO Take 1-2 tablets by mouth every 6 (six) hours as  needed for moderate pain.   KRILL OIL PO Take 1 capsule by mouth daily.   metFORMIN 500 MG tablet Commonly known as:  GLUCOPHAGE TAKE ONE TABLET BY MOUTH TWICE DAILY WITH MEALS   montelukast 10 MG tablet Commonly known as:  SINGULAIR Take 1 tablet (10 mg total) by mouth at bedtime.   MULTIVITAMIN & MINERAL PO Take 1 tablet by mouth daily.   OVER THE COUNTER MEDICATION Apple cider vinegar tablets 3 once a day   PROBIOTIC ADVANCED PO Take 1 capsule by mouth daily.   ranitidine 150 MG tablet Commonly known as:  ZANTAC TAKE ONE TABLET BY MOUTH TWICE DAILY   sertraline 50 MG tablet Commonly known as:  ZOLOFT Take 1 tablet (50 mg total) by mouth daily.   telmisartan-hydrochlorothiazide 80-25 MG tablet Commonly known as:  MICARDIS HCT TAKE ONE TABLET BY MOUTH ONCE DAILY   Turmeric 500 MG Caps Take 500 mg by mouth daily.   VITAMIN D PO Take 1 tablet by mouth daily.          Objective:   Physical Exam BP 128/80 (BP Location: Left Arm, Patient Position: Sitting, Cuff Size: Normal)   Pulse 80   Temp 98.4 F (36.9 C) (Oral)   Resp 14   Ht 5\' 6"  (1.676 m)   Wt 280 lb 2 oz (127.1 kg)   SpO2 96%   BMI 45.21 kg/m  General:   Well developed, well nourished . NAD.  HEENT:  Normocephalic . Face symmetric, atraumatic Lower extremities: No edema, good pedal pulses, pinprick examination normal  Skin: Not pale. Not jaundice Neurologic:  alert & oriented X3.  Speech normal, gait appropriate for age and unassisted Psych--  Cognition and judgment appear intact.  Cooperative with normal attention span and concentration.  Behavior  appropriate. No anxious or depressed appearing.      Assessment & Plan:   61 year old female with multiple medical problems including diabetes, HTN, PSA, DJD, gout, R.A., depression, breast cancer (no chemo-no XRT) presents with:  Neuropathy: Neuropathy as described above, due to diabetes? She had a low normal vitamin D level recently, A1c 6.5, B12 few years ago normal, taking folic acid supplements. We discussed gabapentin, Lyrica (patient reluctant due to possible weight gain and cost) and Elavil.  At the end we agreed to retry gabapentin, watch for swelling, take it only at night.  Also use Capsaicin. Patient educated about feet care with diabetes. Follow-up with PCP, consider furthereval.

## 2017-04-27 NOTE — Patient Instructions (Signed)
Gabapentin 300 mg: 1 or 2 at night  CAPSAICIN nightly  Call if no better    Diabetes and Foot Care Diabetes may cause you to have problems because of poor blood supply (circulation) to your feet and legs. This may cause the skin on your feet to become thinner, break easier, and heal more slowly. Your skin may become dry, and the skin may peel and crack. You may also have nerve damage in your legs and feet causing decreased feeling in them. You may not notice minor injuries to your feet that could lead to infections or more serious problems. Taking care of your feet is one of the most important things you can do for yourself. Follow these instructions at home:  Wear shoes at all times, even in the house. Do not go barefoot. Bare feet are easily injured.  Check your feet daily for blisters, cuts, and redness. If you cannot see the bottom of your feet, use a mirror or ask someone for help.  Wash your feet with warm water (do not use hot water) and mild soap. Then pat your feet and the areas between your toes until they are completely dry. Do not soak your feet as this can dry your skin.  Apply a moisturizing lotion or petroleum jelly (that does not contain alcohol and is unscented) to the skin on your feet and to dry, brittle toenails. Do not apply lotion between your toes.  Trim your toenails straight across. Do not dig under them or around the cuticle. File the edges of your nails with an emery board or nail file.  Do not cut corns or calluses or try to remove them with medicine.  Wear clean socks or stockings every day. Make sure they are not too tight. Do not wear knee-high stockings since they may decrease blood flow to your legs.  Wear shoes that fit properly and have enough cushioning. To break in new shoes, wear them for just a few hours a day. This prevents you from injuring your feet. Always look in your shoes before you put them on to be sure there are no objects inside.  Do not  cross your legs. This may decrease the blood flow to your feet.  If you find a minor scrape, cut, or break in the skin on your feet, keep it and the skin around it clean and dry. These areas may be cleansed with mild soap and water. Do not cleanse the area with peroxide, alcohol, or iodine.  When you remove an adhesive bandage, be sure not to damage the skin around it.  If you have a wound, look at it several times a day to make sure it is healing.  Do not use heating pads or hot water bottles. They may burn your skin. If you have lost feeling in your feet or legs, you may not know it is happening until it is too late.  Make sure your health care provider performs a complete foot exam at least annually or more often if you have foot problems. Report any cuts, sores, or bruises to your health care provider immediately. Contact a health care provider if:  You have an injury that is not healing.  You have cuts or breaks in the skin.  You have an ingrown nail.  You notice redness on your legs or feet.  You feel burning or tingling in your legs or feet.  You have pain or cramps in your legs and feet.  Your legs  or feet are numb.  Your feet always feel cold. Get help right away if:  There is increasing redness, swelling, or pain in or around a wound.  There is a red line that goes up your leg.  Pus is coming from a wound.  You develop a fever or as directed by your health care provider.  You notice a bad smell coming from an ulcer or wound. This information is not intended to replace advice given to you by your health care provider. Make sure you discuss any questions you have with your health care provider. Document Released: 05/12/2000 Document Revised: 10/21/2015 Document Reviewed: 10/22/2012 Elsevier Interactive Patient Education  2017 [employer].

## 2017-04-27 NOTE — Progress Notes (Signed)
Pre visit review using our clinic review tool, if applicable. No additional management support is needed unless otherwise documented below in the visit note. 

## 2017-05-04 ENCOUNTER — Other Ambulatory Visit: Payer: Self-pay | Admitting: Family Medicine

## 2017-05-07 ENCOUNTER — Ambulatory Visit: Payer: BLUE CROSS/BLUE SHIELD | Admitting: Podiatry

## 2017-05-10 ENCOUNTER — Encounter: Payer: Self-pay | Admitting: Podiatry

## 2017-05-10 ENCOUNTER — Ambulatory Visit (INDEPENDENT_AMBULATORY_CARE_PROVIDER_SITE_OTHER): Payer: BLUE CROSS/BLUE SHIELD

## 2017-05-10 ENCOUNTER — Ambulatory Visit: Payer: BLUE CROSS/BLUE SHIELD | Admitting: Podiatry

## 2017-05-10 DIAGNOSIS — M2032 Hallux varus (acquired), left foot: Secondary | ICD-10-CM | POA: Diagnosis not present

## 2017-05-10 DIAGNOSIS — L84 Corns and callosities: Secondary | ICD-10-CM

## 2017-05-10 DIAGNOSIS — G629 Polyneuropathy, unspecified: Secondary | ICD-10-CM | POA: Diagnosis not present

## 2017-05-10 DIAGNOSIS — M79672 Pain in left foot: Secondary | ICD-10-CM | POA: Diagnosis not present

## 2017-05-10 DIAGNOSIS — M722 Plantar fascial fibromatosis: Secondary | ICD-10-CM | POA: Diagnosis not present

## 2017-05-10 NOTE — Patient Instructions (Signed)

## 2017-05-15 NOTE — Progress Notes (Signed)
Subjective:   Patient ID: [name], female   DOB: 61 y.o.   MRN: [id number]   HPI 61 year old female presents the office today for concerns of a callus left big toe and this is also started on the right side.  She states that the callus on the left big toe was doing well however she got a pedicure and they trimmed the area and spot underneath the area.  This is been ongoing intermittently for the last year.  She also states that she had a wart on her right foot that was frozen by her primary care physician and she was to make sure this is resolved as well.  She denies any pain along the callus area today but it does cause irritation set issues.  Denies seeing any drainage or pus coming from the area.  She also states that she occasionally gets some heel and arch pain.  This is intermittent in nature.  She denies any swelling or redness and denies any pain at nighttime.  She said no recent treatment for the arch, heel pain.  She has no other concerns today.   Review of Systems  All other systems reviewed and are negative.  Past Medical History:  Diagnosis Date  . Allergic rhinitis   . Allergy   . Anemia 07/17/2016  . Anxiety   . Asthma    seasonal  . Blood transfusion without reported diagnosis   . Breast cancer ([place])    right  . Bronchitis   . Colon polyps   . Depression   . Diabetes mellitus    borderline but takes metformin  . Edema   . Family history of breast cancer in first degree relative   . GERD (gastroesophageal reflux disease)   . Hyperglycemia 03/05/2017  . Hyperlipidemia 08/21/2016   no meds  . Hyperparathyroidism   . Hypertension    controlled by medications  . Neuromuscular disorder ([facility])    Fibromyalgia  . Obesity   . Osteoarthritis    RA  . PONV (postoperative nausea and vomiting)    occasional  . Sleep apnea    wears cpap  . Viral meningitis   . Vitamin D deficiency     Past Surgical History:  Procedure Laterality Date  . ABDOMINAL HYSTERECTOMY   1998  . ADENOIDECTOMY    . BREAST EXCISIONAL BIOPSY Left 1993   benign  . BREAST SURGERY     Tran flap due to breast cancer  . CESAREAN SECTION  1988  . COLONOSCOPY  08/14/2011  . HAND SURGERY     dog bite, right hand  . HAND SURGERY     trauma, left hand  . KNEE ARTHROSCOPY     bilateral  . left knee replacement  2010  . MASTECTOMY  01/13/94   Right breast  . parathyroid resection    . PARATHYROIDECTOMY    . POLYPECTOMY    . rectal abscess    . SEPTOPLASTY  1980  . SHOULDER ARTHROSCOPY Right 11/09/2015   Procedure: ARTHROSCOPY SHOULDER-acromioplasty, distal clavicle resection and debridement;  Surgeon: Melrose Nakayama, MD;  Location: [facility];  Service: Orthopedics;  Laterality: Right;  . shoulder arthroscopy     rotator cuff repair  . TOE SURGERY Right    paronychia and adenoma removed  . TONSILLECTOMY    . TOTAL KNEE ARTHROPLASTY  06/18/08   [facility]  . TUMOR REMOVAL  1982   , scalp     Current Outpatient Medications:  .  acetic acid-hydrocortisone (  VOSOL-HC) OTIC solution, Place 3 drops into both ears 3 (three) times daily., Disp: 10 mL, Rfl: 0 .  albuterol (VENTOLIN HFA) 108 (90 Base) MCG/ACT inhaler, Inhale 2 puffs into the lungs every 6 (six) hours as needed for wheezing or shortness of breath., Disp: 18 g, Rfl: 1 .  ALPRAZolam (XANAX) 0.25 MG tablet, Take 1 tablet (0.25 mg total) by mouth 2 (two) times daily as needed for anxiety., Disp: 30 tablet, Rfl: 1 .  Calcium Carbonate (CALTRATE 600) 1500 MG TABS, Take 1,500 mg by mouth daily. , Disp: , Rfl:  .  cetirizine (ZYRTEC) 10 MG tablet, TAKE ONE TABLET BY MOUTH TWICE DAILY, Disp: 60 tablet, Rfl: 5 .  Cholecalciferol (VITAMIN D PO), Take 1 tablet by mouth daily., Disp: , Rfl:  .  diclofenac (VOLTAREN) 75 MG EC tablet, Take 1 tablet (75 mg total) by mouth 2 (two) times daily., Disp: 20 tablet, Rfl: 0 .  EPINEPHrine 0.3 mg/0.3 mL IJ SOAJ injection, Inject 0.3 mLs (0.3 mg total) into the muscle once as needed (Throat or facial  swelling or difficulty breathing). (Patient not taking: Reported on 04/27/2017), Disp: 1 Device, Rfl: 0 .  esomeprazole (NEXIUM) 40 MG capsule, Take 1 capsule (40 mg total) by mouth daily., Disp: 90 capsule, Rfl: 1 .  famotidine (PEPCID) 20 MG tablet, Take 1 tablet (20 mg total) by mouth daily., Disp: 90 tablet, Rfl: 0 .  febuxostat (ULORIC) 40 MG tablet, Take 1 tablet (40 mg total) by mouth daily., Disp: 30 tablet, Rfl: 3 .  Ferrous Fumarate-Folic Acid (HEMOCYTE-F) 324-1 MG TABS, Take 1 tablet by mouth daily., Disp: 30 each, Rfl: 6 .  fluocinonide (LIDEX) 0.05 % external solution, As directed, Disp: , Rfl:  .  fluticasone (FLONASE) 50 MCG/ACT nasal spray, Place 2 sprays into both nostrils daily. (Patient taking differently: Place 2 sprays into both nostrils daily as needed for allergies. ), Disp: 16 g, Rfl: 1 .  furosemide (LASIX) 40 MG tablet, TAKE ONE TABLET BY MOUTH TWICE DAILY, Disp: 60 tablet, Rfl: 3 .  gabapentin (NEURONTIN) 300 MG capsule, Take 1-2 capsules (300-600 mg total) by mouth at bedtime., Disp: 60 capsule, Rfl: 3 .  Ginger 500 MG CAPS, Take 500 mg by mouth daily. , Disp: , Rfl:  .  HYDROcodone-acetaminophen (NORCO) 5-325 MG tablet, Take 1-2 tablets by mouth every 6 (six) hours as needed for moderate pain., Disp: 16 tablet, Rfl: 0 .  KRILL OIL PO, Take 1 capsule by mouth daily., Disp: , Rfl:  .  metFORMIN (GLUCOPHAGE) 500 MG tablet, TAKE ONE TABLET BY MOUTH TWICE DAILY WITH MEALS, Disp: 180 tablet, Rfl: 1 .  montelukast (SINGULAIR) 10 MG tablet, Take 1 tablet (10 mg total) by mouth at bedtime., Disp: 30 tablet, Rfl: 5 .  Multiple Vitamins-Minerals (MULTIVITAMIN & MINERAL PO), Take 1 tablet by mouth daily., Disp: , Rfl:  .  OVER THE COUNTER MEDICATION, Apple cider vinegar tablets 3 once a day, Disp: , Rfl:  .  Probiotic Product (PROBIOTIC ADVANCED PO), Take 1 capsule by mouth daily., Disp: , Rfl:  .  ranitidine (ZANTAC) 150 MG tablet, TAKE ONE TABLET BY MOUTH TWICE DAILY, Disp: 60  tablet, Rfl: 5 .  sertraline (ZOLOFT) 50 MG tablet, Take 1 tablet (50 mg total) by mouth daily., Disp: 90 tablet, Rfl: 1 .  telmisartan-hydrochlorothiazide (MICARDIS HCT) 80-25 MG tablet, TAKE 1 TABLET BY MOUTH ONCE DAILY, Disp: 90 tablet, Rfl: 1 .  Turmeric 500 MG CAPS, Take 500 mg by mouth daily. ,  Disp: , Rfl:   Allergies  Allergen Reactions  . Allopurinol Hives  . Mucinex [Guaifenesin Er] Shortness Of Breath  . Penicillins Hives and Other (See Comments)    Has patient had a PCN reaction causing immediate rash, facial/tongue/throat swelling, SOB or lightheadedness with hypotension: no Has patient had a PCN reaction causing severe rash involving mucus membranes or skin necrosis: no Has patient had a PCN reaction that required hospitalization no Has patient had a PCN reaction occurring within the last 10 years: no If all of the above answers are "NO", then may proceed with Cephalosporin use.   . Prozac [Fluoxetine Hcl] Hives  . Lexapro [Escitalopram Oxalate]     Nausea and hypersalivation.     Social History   Socioeconomic History  . Marital status: Widowed    Spouse name: Not on file  . Number of children: 1  . Years of education: Not on file  . Highest education level: Not on file  Social Needs  . Financial resource strain: Not on file  . Food insecurity - worry: Not on file  . Food insecurity - inability: Not on file  . Transportation needs - medical: Not on file  . Transportation needs - non-medical: Not on file  Occupational History  . Occupation: ACCOUNTING    Employer: [employer]  Tobacco Use  . Smoking status: Never Smoker  . Smokeless tobacco: Never Used  Substance and Sexual Activity  . Alcohol use: Yes    Comment: occ.  . Drug use: No  . Sexual activity: Not on file  Other Topics Concern  . Not on file  Social History Narrative  . Not on file        Objective:  Physical Exam  General: AAO x3, NAD  Dermatological: The area the previous wart  on the right medial heel appears to be resolved there is new pink skin present.  Along the dorsal medial aspect of the left hallux IPJ is a hyperkeratotic lesion which is pre-ulcerative and there is a small area of blood underneath the callus however after debridement there is no significant underlying ulceration there is no drainage or pus.  There is no edema, erythema, increase in warmth.  There is no clinical signs of infection noted.  No other open lesions or pre-ulcerative lesions are identified today.  Vascular: Dorsalis Pedis artery and Posterior Tibial artery pedal pulses are 2/4 bilateral with immedate capillary fill time. There is no pain with calf compression, swelling, warmth, erythema.   Neruologic: She occasionally gets some burning to her feet.  She has been seen by her primary care physician and she is on gabapentin for this but she is also been using capsaicin nightly.   Musculoskeletal: Hallux malleus is present the left hallux.  Lateral deviation of the second digit.  There is decreased range of motion the first MTPJ there is mild varus present on the left side.  There is no area of pinpoint tenderness or pain to vibratory sensation.  There is no pain on the course or insertion of the plantar fascia today but subjectively she does with pain in this area as well.  Plantar fascia appears to be intact.  Achilles tendon intact.  Ankle, subtalar joint range of motion intact.  Gait: Unassisted, Nonantalgic.       Assessment:   Pre-ulcerative callus hallux with digital deformity; plantar fasciitis     Plan:  -Treatment options discussed including all alternatives, risks, and complications -Etiology of symptoms were discussed -X-rays  were obtained and reviewed.  No evidence of acute fracture and there is no evidence of osteomyelitis. -I debrided the hyperkeratotic lesion to the left hallux today without any complications or bleeding.  Recommend a small amount of antibiotic ointment  daily.  Offloading pads were dispensed. -Given her foot positioning to think she will benefit from an orthotic.  She was molded for orthotics today by Liliane Channel.  This will also help with her arch and heel pain as well.  Hopefully the insert will help take some pressure off the hallux to help prevent recurring callus and hopefully prevent ulceration.  Ultimately she may need to have surgery to straighten the toe but we will hold off on that for now. -Monitor for any clinical signs or symptoms of infection and directed to call the office immediately should any occur or go to the ER. -Follow-up in 3 weeks to pick up inserts or sooner if any issues are to arise.  I will also see her back at that point to check on the left hallux. -Wart is resolved to the right side but continue to monitor recurrence  Trula Slade DPM

## 2017-05-21 ENCOUNTER — Other Ambulatory Visit: Payer: Self-pay

## 2017-05-21 MED ORDER — FEBUXOSTAT 40 MG PO TABS: 40.0000 mg | ORAL_TABLET | Freq: Every day | ORAL | 1 refills | Status: DC

## 2017-06-05 ENCOUNTER — Ambulatory Visit: Payer: BLUE CROSS/BLUE SHIELD | Admitting: Family Medicine

## 2017-06-05 ENCOUNTER — Encounter: Payer: Self-pay | Admitting: Family Medicine

## 2017-06-05 VITALS — BP 118/68 | HR 82 | Temp 98.4°F | Resp 18 | Wt 291.2 lb

## 2017-06-05 DIAGNOSIS — F418 Other specified anxiety disorders: Secondary | ICD-10-CM | POA: Diagnosis not present

## 2017-06-05 DIAGNOSIS — E782 Mixed hyperlipidemia: Secondary | ICD-10-CM | POA: Diagnosis not present

## 2017-06-05 DIAGNOSIS — E6609 Other obesity due to excess calories: Secondary | ICD-10-CM | POA: Diagnosis not present

## 2017-06-05 DIAGNOSIS — I10 Essential (primary) hypertension: Secondary | ICD-10-CM

## 2017-06-05 DIAGNOSIS — G4700 Insomnia, unspecified: Secondary | ICD-10-CM | POA: Diagnosis not present

## 2017-06-05 DIAGNOSIS — E1140 Type 2 diabetes mellitus with diabetic neuropathy, unspecified: Secondary | ICD-10-CM

## 2017-06-05 DIAGNOSIS — M109 Gout, unspecified: Secondary | ICD-10-CM | POA: Diagnosis not present

## 2017-06-05 DIAGNOSIS — G2581 Restless legs syndrome: Secondary | ICD-10-CM

## 2017-06-05 DIAGNOSIS — G4733 Obstructive sleep apnea (adult) (pediatric): Secondary | ICD-10-CM | POA: Diagnosis not present

## 2017-06-05 LAB — CBC
HCT: 36 % (ref 36.0–46.0)
Hemoglobin: 11.6 g/dL — ABNORMAL LOW (ref 12.0–15.0)
MCHC: 32.3 g/dL (ref 30.0–36.0)
MCV: 86.6 fl (ref 78.0–100.0)
Platelets: 240 10*3/uL (ref 150.0–400.0)
RBC: 4.16 Mil/uL (ref 3.87–5.11)
RDW: 15.4 % (ref 11.5–15.5)
WBC: 6.7 10*3/uL (ref 4.0–10.5)

## 2017-06-05 LAB — COMPREHENSIVE METABOLIC PANEL
ALT: 16 U/L (ref 0–35)
AST: 17 U/L (ref 0–37)
Albumin: 4.1 g/dL (ref 3.5–5.2)
Alkaline Phosphatase: 69 U/L (ref 39–117)
BUN: 23 mg/dL (ref 6–23)
CO2: 35 mEq/L — ABNORMAL HIGH (ref 19–32)
Calcium: 9.8 mg/dL (ref 8.4–10.5)
Chloride: 96 mEq/L (ref 96–112)
Creatinine, Ser: 0.98 mg/dL (ref 0.40–1.20)
GFR: 60.95 mL/min (ref >60.00)
Glucose, Bld: 123 mg/dL — ABNORMAL HIGH (ref 70–99)
Potassium: 3.9 mEq/L (ref 3.5–5.1)
Sodium: 137 mEq/L (ref 135–145)
Total Bilirubin: 0.3 mg/dL (ref 0.2–1.2)
Total Protein: 7 g/dL (ref 6.0–8.3)

## 2017-06-05 LAB — LIPID PANEL
Cholesterol: 155 mg/dL (ref 0–200)
HDL: 46.2 mg/dL (ref >39.00)
LDL Cholesterol: 80 mg/dL (ref 0–99)
NonHDL: 109.13
Total CHOL/HDL Ratio: 3
Triglycerides: 144 mg/dL (ref 0.0–149.0)
VLDL: 28.8 mg/dL (ref 0.0–40.0)

## 2017-06-05 LAB — URIC ACID: Uric Acid, Serum: 5.4 mg/dL (ref 2.4–7.0)

## 2017-06-05 LAB — TSH: TSH: 1 u[IU]/mL (ref 0.35–4.50)

## 2017-06-05 LAB — HEMOGLOBIN A1C: Hgb A1c MFr Bld: 6.4 % (ref 4.6–6.5)

## 2017-06-05 MED ORDER — ROPINIROLE HCL 0.5 MG PO TABS: 0.5000 mg | ORAL_TABLET | Freq: Three times a day (TID) | ORAL | 1 refills | Status: DC

## 2017-06-05 MED ORDER — AMITRIPTYLINE HCL 10 MG PO TABS: 10.0000 mg | ORAL_TABLET | Freq: Every day | ORAL | 1 refills | Status: DC

## 2017-06-05 NOTE — Patient Instructions (Addendum)
Sarna lotion is for itching.  Benadryl 25 to 50 mg at bed as needed for itching and sleep Start Amitriptyline at 10 mg nightly x 3 days then increase to 20 mg at bed as tolerated and as needed. Wait 3-5 more and can increase  to 30 mg as needed and tolerated  Start Requip as needed 3 days after the amitriptyline has been increased Insomnia Insomnia is a sleep disorder that makes it difficult to fall asleep or to stay asleep. Insomnia can cause tiredness (fatigue), low energy, difficulty concentrating, mood swings, and poor performance at work or school. There are three different ways to classify insomnia:  Difficulty falling asleep.  Difficulty staying asleep.  Waking up too early in the morning.  Any type of insomnia can be long-term (chronic) or short-term (acute). Both are common. Short-term insomnia usually lasts for three months or less. Chronic insomnia occurs at least three times a week for longer than three months. What are the causes? Insomnia may be caused by another condition, situation, or substance, such as:  Anxiety.  Certain medicines.  Gastroesophageal reflux disease (GERD) or other gastrointestinal conditions.  Asthma or other breathing conditions.  Restless legs syndrome, sleep apnea, or other sleep disorders.  Chronic pain.  Menopause. This may include hot flashes.  Stroke.  Abuse of alcohol, tobacco, or illegal drugs.  Depression.  Caffeine.  Neurological disorders, such as Alzheimer disease.  An overactive thyroid (hyperthyroidism).  The cause of insomnia may not be known. What increases the risk? Risk factors for insomnia include:  Gender. Women are more commonly affected than men.  Age. Insomnia is more common as you get older.  Stress. This may involve your professional or personal life.  Income. Insomnia is more common in people with lower income.  Lack of exercise.  Irregular work schedule or night shifts.  Traveling between  different time zones.  What are the signs or symptoms? If you have insomnia, trouble falling asleep or trouble staying asleep is the main symptom. This may lead to other symptoms, such as:  Feeling fatigued.  Feeling nervous about going to sleep.  Not feeling rested in the morning.  Having trouble concentrating.  Feeling irritable, anxious, or depressed.  How is this treated? Treatment for insomnia depends on the cause. If your insomnia is caused by an underlying condition, treatment will focus on addressing the condition. Treatment may also include:  Medicines to help you sleep.  Counseling or therapy.  Lifestyle adjustments.  Follow these instructions at home:  Take medicines only as directed by your health care provider.  Keep regular sleeping and waking hours. Avoid naps.  Keep a sleep diary to help you and your health care provider figure out what could be causing your insomnia. Include: ? When you sleep. ? When you wake up during the night. ? How well you sleep. ? How rested you feel the next day. ? Any side effects of medicines you are taking. ? What you eat and drink.  Make your bedroom a comfortable place where it is easy to fall asleep: ? Put up shades or special blackout curtains to block light from outside. ? Use a white noise machine to block noise. ? Keep the temperature cool.  Exercise regularly as directed by your health care provider. Avoid exercising right before bedtime.  Use relaxation techniques to manage stress. Ask your health care provider to suggest some techniques that may work well for you. These may include: ? Breathing exercises. ? Routines to release  muscle tension. ? Visualizing peaceful scenes.  Cut back on alcohol, caffeinated beverages, and cigarettes, especially close to bedtime. These can disrupt your sleep.  Do not overeat or eat spicy foods right before bedtime. This can lead to digestive discomfort that can make it hard for  you to sleep.  Limit screen use before bedtime. This includes: ? Watching TV. ? Using your smartphone, tablet, and computer.  Stick to a routine. This can help you fall asleep faster. Try to do a quiet activity, brush your teeth, and go to bed at the same time each night.  Get out of bed if you are still awake after 15 minutes of trying to sleep. Keep the lights down, but try reading or doing a quiet activity. When you feel sleepy, go back to bed.  Make sure that you drive carefully. Avoid driving if you feel very sleepy.  Keep all follow-up appointments as directed by your health care provider. This is important. Contact a health care provider if:  You are tired throughout the day or have trouble in your daily routine due to sleepiness.  You continue to have sleep problems or your sleep problems get worse. Get help right away if:  You have serious thoughts about hurting yourself or someone else. This information is not intended to replace advice given to you by your health care provider. Make sure you discuss any questions you have with your health care provider. Document Released: 05/12/2000 Document Revised: 10/15/2015 Document Reviewed: 02/13/2014 Elsevier Interactive Patient Education  [employer].

## 2017-06-05 NOTE — Assessment & Plan Note (Signed)
hgba1c acceptable, minimize simple carbs. Increase exercise as tolerated. Continue current meds 

## 2017-06-05 NOTE — Progress Notes (Signed)
Subjective:  I acted as a Education administrator for Dr. Charlett Blake. [place], [place]  Patient ID: [name], female    DOB: March 12, 1956, 62 y.o.   MRN: [id number]  No chief complaint on file.   HPI  Patient is in today for a 3 month follow up and over all is doing well. No recent febrile illness or hospitalization. She is happy to be seeing podiatry and her foot symptoms are improving with her great care. Notes some trouble with poor sleep. Has trouble maintaining sleep. Wakes with fatigue and headaches at times. Also notes RLS symptoms with her legs twitching and having a hard time getting comfortable. Denies CP/palp/SOB/HA/congestion/fevers/GI or GU c/o. Taking meds as prescribed  Patient Care Team: Mosie Lukes, MD as PCP - General (Family Medicine)   Past Medical History:  Diagnosis Date  . Allergic rhinitis   . Allergy   . Anemia 07/17/2016  . Anxiety   . Asthma    seasonal  . Blood transfusion without reported diagnosis   . Breast cancer ([place])    right  . Bronchitis   . Colon polyps   . Depression   . Diabetes mellitus    borderline but takes metformin  . Edema   . Family history of breast cancer in first degree relative   . GERD (gastroesophageal reflux disease)   . Hyperglycemia 03/05/2017  . Hyperlipidemia 08/21/2016   no meds  . Hyperparathyroidism   . Hypertension    controlled by medications  . Neuromuscular disorder (HCC)    Fibromyalgia  . Obesity   . Osteoarthritis    RA  . PONV (postoperative nausea and vomiting)    occasional  . Sleep apnea    wears cpap  . Viral meningitis   . Vitamin D deficiency     Past Surgical History:  Procedure Laterality Date  . ABDOMINAL HYSTERECTOMY  1998  . ADENOIDECTOMY    . BREAST EXCISIONAL BIOPSY Left 1993   benign  . BREAST SURGERY     Tran flap due to breast cancer  . CESAREAN SECTION  1988  . COLONOSCOPY  08/14/2011  . HAND SURGERY     dog bite, right hand  . HAND SURGERY     trauma, left hand  . KNEE ARTHROSCOPY      bilateral  . left knee replacement  2010  . MASTECTOMY  01/13/94   Right breast  . parathyroid resection    . PARATHYROIDECTOMY    . POLYPECTOMY    . rectal abscess    . SEPTOPLASTY  1980  . SHOULDER ARTHROSCOPY Right 11/09/2015   Procedure: ARTHROSCOPY SHOULDER-acromioplasty, distal clavicle resection and debridement;  Surgeon: Melrose Nakayama, MD;  Location: [facility];  Service: Orthopedics;  Laterality: Right;  . shoulder arthroscopy     rotator cuff repair  . TOE SURGERY Right    paronychia and adenoma removed  . TONSILLECTOMY    . TOTAL KNEE ARTHROPLASTY  06/18/08   [facility]  . TUMOR REMOVAL  1982   , scalp    Family History  Problem Relation Age of Onset  . Emphysema Father   . Lung cancer Father        lung ca dx 69  . Other Father        prostate issues  . Breast cancer Maternal Aunt        dx late 50s  . Colon cancer Maternal Aunt        dx 84s  . Colon polyps Maternal Aunt   .  Prostate cancer Maternal Grandfather 85       d. 85y  . Heart disease Paternal Grandfather   . Heart attack Paternal Grandfather        d. 50y  . Diabetes Paternal Grandfather   . Asthma Daughter        "seasonal"  . Arthritis Maternal Grandmother   . Aneurysm Maternal Grandmother        d. brain aneurysm at 83  . Arthritis Mother   . Colon polyps Mother   . Multiple sclerosis Sister   . Breast cancer Sister 37       L IDC and DCIS; ER/PR+, Her2-  . Fibrocystic breast disease Sister   . Arthritis/Rheumatoid Sister   . Cancer Maternal Uncle        d. mouth cancer at younger age; smoker  . Other Paternal Uncle        muscle issues - couldn't walk or talk; d. 20y  . Cancer Maternal Aunt        lymphoma, dx 76s  . Ovarian cancer Maternal Aunt        dx 63s; d. late 31s  . Lung cancer Maternal Uncle        d. 77y; former smoker  . Throat cancer Cousin        maternal 1st cousin; smoker  . Breast cancer Cousin 71       maternal 1st cousin  . Other Paternal Uncle        prostate  issues  . Breast cancer Cousin        paternal 1st cousin dx late 52s  . Throat cancer Cousin        maternal 1st cousin; used SL tobacco  . Prostate cancer Cousin        maternal 1st cousin dx 71s  . Esophageal cancer Neg Hx   . Rectal cancer Neg Hx   . Stomach cancer Neg Hx     Social History   Socioeconomic History  . Marital status: Widowed    Spouse name: Not on file  . Number of children: 1  . Years of education: Not on file  . Highest education level: Not on file  Social Needs  . Financial resource strain: Not on file  . Food insecurity - worry: Not on file  . Food insecurity - inability: Not on file  . Transportation needs - medical: Not on file  . Transportation needs - non-medical: Not on file  Occupational History  . Occupation: ACCOUNTING    Employer: [employer]  Tobacco Use  . Smoking status: Never Smoker  . Smokeless tobacco: Never Used  Substance and Sexual Activity  . Alcohol use: Yes    Comment: occ.  . Drug use: No  . Sexual activity: Not on file  Other Topics Concern  . Not on file  Social History Narrative  . Not on file    Outpatient Medications Prior to Visit  Medication Sig Dispense Refill  . acetic acid-hydrocortisone (VOSOL-HC) OTIC solution Place 3 drops into both ears 3 (three) times daily. 10 mL 0  . albuterol (VENTOLIN HFA) 108 (90 Base) MCG/ACT inhaler Inhale 2 puffs into the lungs every 6 (six) hours as needed for wheezing or shortness of breath. 18 g 1  . ALPRAZolam (XANAX) 0.25 MG tablet Take 1 tablet (0.25 mg total) by mouth 2 (two) times daily as needed for anxiety. 30 tablet 1  . Calcium Carbonate (CALTRATE 600) 1500 MG TABS Take 1,500 mg  by mouth daily.     . cetirizine (ZYRTEC) 10 MG tablet TAKE ONE TABLET BY MOUTH TWICE DAILY 60 tablet 5  . Cholecalciferol (VITAMIN D PO) Take 1 tablet by mouth daily.    . diclofenac (VOLTAREN) 75 MG EC tablet Take 1 tablet (75 mg total) by mouth 2 (two) times daily. 20 tablet 0  .  EPINEPHrine 0.3 mg/0.3 mL IJ SOAJ injection Inject 0.3 mLs (0.3 mg total) into the muscle once as needed (Throat or facial swelling or difficulty breathing). 1 Device 0  . esomeprazole (NEXIUM) 40 MG capsule Take 1 capsule (40 mg total) by mouth daily. 90 capsule 1  . famotidine (PEPCID) 20 MG tablet Take 1 tablet (20 mg total) by mouth daily. 90 tablet 0  . febuxostat (ULORIC) 40 MG tablet Take 1 tablet (40 mg total) by mouth daily. 90 tablet 1  . Ferrous Fumarate-Folic Acid (HEMOCYTE-F) 324-1 MG TABS Take 1 tablet by mouth daily. 30 each 6  . fluocinonide (LIDEX) 0.05 % external solution As directed    . fluticasone (FLONASE) 50 MCG/ACT nasal spray Place 2 sprays into both nostrils daily. (Patient taking differently: Place 2 sprays into both nostrils daily as needed for allergies. ) 16 g 1  . furosemide (LASIX) 40 MG tablet TAKE ONE TABLET BY MOUTH TWICE DAILY 60 tablet 3  . Ginger 500 MG CAPS Take 500 mg by mouth daily.     Marland Kitchen HYDROcodone-acetaminophen (NORCO) 5-325 MG tablet Take 1-2 tablets by mouth every 6 (six) hours as needed for moderate pain. 16 tablet 0  . KRILL OIL PO Take 1 capsule by mouth daily.    . metFORMIN (GLUCOPHAGE) 500 MG tablet TAKE ONE TABLET BY MOUTH TWICE DAILY WITH MEALS 180 tablet 1  . montelukast (SINGULAIR) 10 MG tablet Take 1 tablet (10 mg total) by mouth at bedtime. 30 tablet 5  . Multiple Vitamins-Minerals (MULTIVITAMIN & MINERAL PO) Take 1 tablet by mouth daily.    Marland Kitchen OVER THE COUNTER MEDICATION Apple cider vinegar tablets 3 once a day    . Probiotic Product (PROBIOTIC ADVANCED PO) Take 1 capsule by mouth daily.    . ranitidine (ZANTAC) 150 MG tablet TAKE ONE TABLET BY MOUTH TWICE DAILY 60 tablet 5  . sertraline (ZOLOFT) 50 MG tablet Take 1 tablet (50 mg total) by mouth daily. 90 tablet 1  . telmisartan-hydrochlorothiazide (MICARDIS HCT) 80-25 MG tablet TAKE 1 TABLET BY MOUTH ONCE DAILY 90 tablet 1  . Turmeric 500 MG CAPS Take 500 mg by mouth daily.     Marland Kitchen  gabapentin (NEURONTIN) 300 MG capsule Take 1-2 capsules (300-600 mg total) by mouth at bedtime. 60 capsule 3   No facility-administered medications prior to visit.     Allergies  Allergen Reactions  . Allopurinol Hives  . Mucinex [Guaifenesin Er] Shortness Of Breath  . Penicillins Hives and Other (See Comments)    Has patient had a PCN reaction causing immediate rash, facial/tongue/throat swelling, SOB or lightheadedness with hypotension: no Has patient had a PCN reaction causing severe rash involving mucus membranes or skin necrosis: no Has patient had a PCN reaction that required hospitalization no Has patient had a PCN reaction occurring within the last 10 years: no If all of the above answers are "NO", then may proceed with Cephalosporin use.   . Prozac [Fluoxetine Hcl] Hives  . Lexapro [Escitalopram Oxalate]     Nausea and hypersalivation.     Review of Systems  Constitutional: Positive for malaise/fatigue. Negative for fever.  HENT: Negative for congestion.   Eyes: Negative for blurred vision.  Respiratory: Negative for shortness of breath.   Cardiovascular: Negative for chest pain, palpitations and leg swelling.  Gastrointestinal: Negative for abdominal pain, blood in stool and nausea.  Genitourinary: Negative for dysuria and frequency.  Musculoskeletal: Positive for joint pain. Negative for falls.  Skin: Negative for rash.  Neurological: Negative for dizziness, loss of consciousness and headaches.  Endo/Heme/Allergies: Negative for environmental allergies.  Psychiatric/Behavioral: Negative for depression. The patient has insomnia. The patient is not nervous/anxious.        Objective:    Physical Exam  Constitutional: She is oriented to person, place, and time. She appears well-developed and well-nourished. No distress.  HENT:  Head: Normocephalic and atraumatic.  Nose: Nose normal.  Eyes: Right eye exhibits no discharge. Left eye exhibits no discharge.  Neck:  Normal range of motion. Neck supple.  Cardiovascular: Normal rate and regular rhythm.  No murmur heard. Pulmonary/Chest: Effort normal and breath sounds normal.  Abdominal: Soft. Bowel sounds are normal. There is no tenderness.  Musculoskeletal: She exhibits no edema.  Neurological: She is alert and oriented to person, place, and time.  Skin: Skin is warm and dry.  Psychiatric: She has a normal mood and affect.  Nursing note and vitals reviewed.   BP 118/68 (BP Location: Left Arm, Patient Position: Sitting, Cuff Size: Normal)   Pulse 82   Temp 98.4 F (36.9 C) (Oral)   Resp 18   Wt 291 lb 3.2 oz (132.1 kg)   SpO2 98%   BMI 47.00 kg/m  Wt Readings from Last 3 Encounters:  06/05/17 291 lb 3.2 oz (132.1 kg)  04/27/17 280 lb 2 oz (127.1 kg)  03/21/17 280 lb 9.6 oz (127.3 kg)   BP Readings from Last 3 Encounters:  06/05/17 118/68  04/27/17 128/80  03/21/17 115/75     Immunization History  Administered Date(s) Administered  . Influenza Split 03/27/2011, 03/29/2012  . Influenza Whole 04/07/2009, 03/28/2010  . Influenza,inj,Quad PF,6+ Mos 02/26/2013, 01/28/2016, 03/05/2017  . Pneumococcal Polysaccharide-23 04/03/2014  . Td 04/29/2009  . Zoster 01/28/2016    Health Maintenance  Topic Date Due  . Hepatitis C Screening  Jul 09, 1955  . HIV Screening  07/23/1969  . OPHTHALMOLOGY EXAM  06/13/2016  . PAP SMEAR  03/31/2017  . FOOT EXAM  07/11/2017  . MAMMOGRAM  09/13/2017  . HEMOGLOBIN A1C  12/03/2017  . PNEUMOCOCCAL POLYSACCHARIDE VACCINE (2) 04/04/2019  . TETANUS/TDAP  04/30/2019  . COLONOSCOPY  09/06/2021  . INFLUENZA VACCINE  Completed    Lab Results  Component Value Date   WBC 6.7 06/05/2017   HGB 11.6 (L) 06/05/2017   HCT 36.0 06/05/2017   PLT 240.0 06/05/2017   GLUCOSE 123 (H) 06/05/2017   CHOL 155 06/05/2017   TRIG 144.0 06/05/2017   HDL 46.20 06/05/2017   LDLDIRECT 110.0 08/21/2016   LDLCALC 80 06/05/2017   ALT 16 06/05/2017   AST 17 06/05/2017   NA 137  06/05/2017   K 3.9 06/05/2017   CL 96 06/05/2017   CREATININE 0.98 06/05/2017   BUN 23 06/05/2017   CO2 35 (H) 06/05/2017   TSH 1.00 06/05/2017   INR 2.0 (H) 06/21/2008   HGBA1C 6.4 06/05/2017    Lab Results  Component Value Date   TSH 1.00 06/05/2017   Lab Results  Component Value Date   WBC 6.7 06/05/2017   HGB 11.6 (L) 06/05/2017   HCT 36.0 06/05/2017   MCV 86.6 06/05/2017   PLT  240.0 06/05/2017   Lab Results  Component Value Date   NA 137 06/05/2017   K 3.9 06/05/2017   CO2 35 (H) 06/05/2017   GLUCOSE 123 (H) 06/05/2017   BUN 23 06/05/2017   CREATININE 0.98 06/05/2017   BILITOT 0.3 06/05/2017   ALKPHOS 69 06/05/2017   AST 17 06/05/2017   ALT 16 06/05/2017   PROT 7.0 06/05/2017   ALBUMIN 4.1 06/05/2017   CALCIUM 9.8 06/05/2017   ANIONGAP 11 11/09/2015   GFR 60.95 06/05/2017   Lab Results  Component Value Date   CHOL 155 06/05/2017   Lab Results  Component Value Date   HDL 46.20 06/05/2017   Lab Results  Component Value Date   LDLCALC 80 06/05/2017   Lab Results  Component Value Date   TRIG 144.0 06/05/2017   Lab Results  Component Value Date   CHOLHDL 3 06/05/2017   Lab Results  Component Value Date   HGBA1C 6.4 06/05/2017         Assessment & Plan:   Problem List Items Addressed This Visit    Gout of big toe (Chronic)    Asymptomatic check level      Relevant Orders   Uric acid (Completed)   Depression with anxiety    Feels she is managing her stress despite her sister's MS her mother's poor health and dementia she lost her job and looses insurance in May. She did use one Alprazolam when she was very stressed over the [place] and it helped her without being over sedated.      Relevant Medications   amitriptyline (ELAVIL) 10 MG tablet   Essential hypertension    Well controlled, no changes to meds. Encouraged heart healthy diet such as the DASH diet and exercise as tolerated.       Relevant Orders   CBC (Completed)    Comprehensive metabolic panel (Completed)   TSH (Completed)   Insomnia    Encouraged good sleep hygiene such as dark, quiet room. No blue/green glowing lights such as computer screens in bedroom. No alcohol or stimulants in evening. Cut down on caffeine as able. Regular exercise is helpful but not just prior to bed time. Has tried melatonin up to 3 mg daily can try increasing to 10 mg if not helpful can use Elavil 10-20 mg qhs and is set up for a new sleep study to evaluate CPAP machine      Obesity    Encouraged DASH diet, decrease po intake and increase exercise as tolerated. Needs 7-8 hours of sleep nightly. Avoid trans fats, eat small, frequent meals every 4-5 hours with lean proteins, complex carbs and healthy fats. Minimize simple carbs, bariatric referra;      Diabetes mellitus, type 2 (HCC)    hgba1c acceptable, minimize simple carbs. Increase exercise as tolerated. Continue current meds      Relevant Orders   Hemoglobin A1c (Completed)   TSH (Completed)   OSA (obstructive sleep apnea) - Primary    Sleep is worsening, notes am headaches and fatigue as well. Will refer back to pulmonology for furhter consideration.      Relevant Orders   Ambulatory referral to Pulmonology   Hyperlipidemia   Relevant Orders   Lipid panel (Completed)   RLS (restless legs syndrome)    Increase hydration, try topical rubs and consider Requip         I have discontinued Jersie K. Faison's gabapentin. I am also having her start on amitriptyline and rOPINIRole. Additionally, I am  having her maintain her calcium carbonate, Multiple Vitamins-Minerals (MULTIVITAMIN & MINERAL PO), Ginger, Turmeric, fluticasone, Cholecalciferol (VITAMIN D PO), EPINEPHrine, montelukast, Ferrous Fumarate-Folic Acid, albuterol, KRILL OIL PO, Probiotic Product (PROBIOTIC ADVANCED PO), OVER THE COUNTER MEDICATION, diclofenac, furosemide, acetic acid-hydrocortisone, cetirizine, ranitidine, HYDROcodone-acetaminophen, metFORMIN,  famotidine, ALPRAZolam, esomeprazole, sertraline, fluocinonide, telmisartan-hydrochlorothiazide, and febuxostat.  Meds ordered this encounter  Medications  . amitriptyline (ELAVIL) 10 MG tablet    Sig: Take 1-3 tablets (10-30 mg total) by mouth at bedtime.    Dispense:  90 tablet    Refill:  1  . rOPINIRole (REQUIP) 0.5 MG tablet    Sig: Take 1 tablet (0.5 mg total) by mouth 3 (three) times daily.    Dispense:  30 tablet    Refill:  1    CMA served as scribe during this visit. History, Physical and Plan performed by medical provider. Documentation and orders reviewed and attested to.  Penni Homans, MD

## 2017-06-05 NOTE — Assessment & Plan Note (Signed)
Feels she is managing her stress despite her sister's MS her mother's poor health and dementia she lost her job and looses insurance in May. She did use one Alprazolam when she was very stressed over the [place] and it helped her without being over sedated.

## 2017-06-05 NOTE — Assessment & Plan Note (Signed)
Asymptomatic check level

## 2017-06-05 NOTE — Assessment & Plan Note (Signed)
Encouraged DASH diet, decrease po intake and increase exercise as tolerated. Needs 7-8 hours of sleep nightly. Avoid trans fats, eat small, frequent meals every 4-5 hours with lean proteins, complex carbs and healthy fats. Minimize simple carbs, bariatric referra;

## 2017-06-05 NOTE — Assessment & Plan Note (Addendum)
Encouraged good sleep hygiene such as dark, quiet room. No blue/green glowing lights such as computer screens in bedroom. No alcohol or stimulants in evening. Cut down on caffeine as able. Regular exercise is helpful but not just prior to bed time. Has tried melatonin up to 3 mg daily can try increasing to 10 mg if not helpful can use Elavil 10-20 mg qhs and is set up for a new sleep study to evaluate CPAP machine

## 2017-06-05 NOTE — Assessment & Plan Note (Signed)
Well controlled, no changes to meds. Encouraged heart healthy diet such as the DASH diet and exercise as tolerated.  °

## 2017-06-07 ENCOUNTER — Encounter: Payer: Self-pay | Admitting: Podiatry

## 2017-06-07 ENCOUNTER — Ambulatory Visit: Payer: BLUE CROSS/BLUE SHIELD | Admitting: Orthotics

## 2017-06-07 ENCOUNTER — Ambulatory Visit: Payer: BLUE CROSS/BLUE SHIELD | Admitting: Podiatry

## 2017-06-07 DIAGNOSIS — M2032 Hallux varus (acquired), left foot: Secondary | ICD-10-CM

## 2017-06-07 DIAGNOSIS — M722 Plantar fascial fibromatosis: Secondary | ICD-10-CM

## 2017-06-07 DIAGNOSIS — L539 Erythematous condition, unspecified: Secondary | ICD-10-CM

## 2017-06-07 DIAGNOSIS — G2581 Restless legs syndrome: Secondary | ICD-10-CM | POA: Insufficient documentation

## 2017-06-07 DIAGNOSIS — L84 Corns and callosities: Secondary | ICD-10-CM

## 2017-06-07 HISTORY — DX: Restless legs syndrome: G25.81

## 2017-06-07 MED ORDER — DOXYCYCLINE HYCLATE 100 MG PO TABS: 100.0000 mg | ORAL_TABLET | Freq: Two times a day (BID) | ORAL | 0 refills | Status: DC

## 2017-06-07 NOTE — Assessment & Plan Note (Signed)
Increase hydration, try topical rubs and consider Requip

## 2017-06-07 NOTE — Progress Notes (Signed)
Patient picked up f/o; however could not try them on for definitve fit due to the fact she is still in healing shoe.

## 2017-06-07 NOTE — Assessment & Plan Note (Signed)
Sleep is worsening, notes am headaches and fatigue as well. Will refer back to pulmonology for furhter consideration.

## 2017-06-07 NOTE — Patient Instructions (Signed)

## 2017-06-08 ENCOUNTER — Encounter: Payer: Self-pay | Admitting: Family Medicine

## 2017-06-08 NOTE — Telephone Encounter (Signed)
Spoke w/ Jasmine at [facility], verbal given to fill medications together.

## 2017-06-10 NOTE — Progress Notes (Signed)
Subjective: [name] presents the office today for follow-up evaluation of a painful lesion to the left big toe.  She states that it is still sore and she has noticed some redness around the area.  She denies any drainage or pus and she denies any red streaks.  She states the area still painful with shoes.  No other concerns today.  No recent injury.  She also presents today to pick up orthotics Denies any systemic complaints such as fevers, chills, nausea, vomiting. No acute changes since last appointment, and no other complaints at this time.   Objective: AAO x3, NAD DP/PT pulses palpable bilaterally, CRT less than 3 seconds Hallux malleus is present.  On the dorsal medial aspect of the left IPJ there is a small, minimal hyperkeratotic tissue.  There is no underlying ulceration.  There is slight erythema around this area and a mild increase in warmth there is no ascending cellulitis.  There is no fluctuation or crepitation.  There is no malodor. No open lesions or pre-ulcerative lesions.  No pain with calf compression, swelling, warmth, erythema  Assessment: Erythema left hallux likely result of irritation  Plan: -All treatment options discussed with the patient including all alternatives, risks, complications.  -Given the ongoing erythema to the left hallux and mild increase in warmth and start antibiotics in case of infection although I think this is still more from inflammation given the digital deformity and rubbing inside of her shoes.  Prescribed doxycycline.  Also will put under a surgical shoe to help take pressure off the area and this was dispensed today. -I did dispense the orthotics in order to hold off on wearing them.  I did cut them to fit her shoes. -Monitor for any clinical signs or symptoms of infection and directed to call the office immediately should any occur or go to the ER. -Follow-up as scheduled or sooner if needed.  She has no further questions or concerns and agrees  this plan. -Patient encouraged to call the office with any questions, concerns, change in symptoms.   Trula Slade DPM

## 2017-06-11 ENCOUNTER — Encounter: Payer: Self-pay | Admitting: Family Medicine

## 2017-06-11 NOTE — Telephone Encounter (Signed)
Spoke with pharmacist, Rick Duff. She apologizes for not having record of our previous authorization below and took verbal to fill both medications at current doses. Message sent to pt.

## 2017-06-11 NOTE — Telephone Encounter (Signed)
See previous email from 06/08/17.

## 2017-06-11 NOTE — Telephone Encounter (Signed)
See previous email from 06/08/17

## 2017-06-12 NOTE — Telephone Encounter (Signed)
Patient picked up medication

## 2017-06-12 NOTE — Telephone Encounter (Signed)
Done

## 2017-06-21 ENCOUNTER — Ambulatory Visit (INDEPENDENT_AMBULATORY_CARE_PROVIDER_SITE_OTHER): Payer: BLUE CROSS/BLUE SHIELD

## 2017-06-21 ENCOUNTER — Encounter: Payer: Self-pay | Admitting: Podiatry

## 2017-06-21 ENCOUNTER — Ambulatory Visit: Payer: BLUE CROSS/BLUE SHIELD | Admitting: Podiatry

## 2017-06-21 DIAGNOSIS — M2032 Hallux varus (acquired), left foot: Secondary | ICD-10-CM

## 2017-06-24 ENCOUNTER — Encounter: Payer: Self-pay | Admitting: Podiatry

## 2017-06-24 NOTE — Progress Notes (Signed)
Subjective: [name] presents the office today for follow-up evaluation of a painful lesion to the left big toe. She states that it is getting better but it is still sore.  She states that when she wears shoes in the wintertime when she gets the majority of the discomfort.  She states that if she needs to have surgery she like to do this in the next couple of months if able.  She denies any open sores.  She denies any increase in swelling or redness.  She states the area of her left big toe still has a small spot and is painful but overall it does look better.  She has no other concerns today. Denies any systemic complaints such as fevers, chills, nausea, vomiting. No acute changes since last appointment, and no other complaints at this time.   Objective: AAO x3, NAD DP/PT pulses palpable bilaterally, CRT less than 3 seconds Hallux malleus is present.  On the dorsal medial aspect of the left IPJ there is a small erythematous spot likely more from inflammation as opposed to infection.  There is no skin breakdown.  Upon weightbearing she has a hallux varus present however her other digits are also medially deviated position No open lesions or pre-ulcerative lesions.  No pain with calf compression, swelling, warmth, erythema      Assessment: Erythema left hallux likely result of irritation  Plan: -All treatment options discussed with the patient including all alternatives, risks, complications.  -She still in the surgical shoe.  I would like for her to get into the orthotic and had a Liliane Channel also reevaluate her today for the inserts.  She had to get the inserts from think that we need to have more support to help take pressure off the area.  If we try the orthotics without any further success despite modifications we discussed surgical intervention.  I discussed with her exostectomy of the hallux.  Liliane Channel also evaluate her today for orthotic modifications.  Trula Slade DPM

## 2017-06-25 ENCOUNTER — Other Ambulatory Visit: Payer: BLUE CROSS/BLUE SHIELD | Admitting: Orthotics

## 2017-06-26 ENCOUNTER — Ambulatory Visit: Payer: BLUE CROSS/BLUE SHIELD | Admitting: Orthotics

## 2017-06-26 DIAGNOSIS — M2032 Hallux varus (acquired), left foot: Secondary | ICD-10-CM

## 2017-06-26 DIAGNOSIS — M722 Plantar fascial fibromatosis: Secondary | ICD-10-CM

## 2017-06-26 DIAGNOSIS — L84 Corns and callosities: Secondary | ICD-10-CM

## 2017-06-26 NOTE — Progress Notes (Signed)
Patient came in today for adjustment to f/o to provide more relief to hallux malleus L.  I trimmed 1/8" of material off forefoot of current f/o and added scaphoid padding to beef up arch.  She seemed to get immediate relief.  Did advise her to get wide shoes.

## 2017-07-03 ENCOUNTER — Other Ambulatory Visit: Payer: Self-pay | Admitting: Obstetrics and Gynecology

## 2017-07-03 DIAGNOSIS — N6320 Unspecified lump in the left breast, unspecified quadrant: Secondary | ICD-10-CM

## 2017-07-06 ENCOUNTER — Ambulatory Visit: Payer: BLUE CROSS/BLUE SHIELD | Admitting: Family Medicine

## 2017-07-06 ENCOUNTER — Ambulatory Visit
Admission: RE | Admit: 2017-07-06 | Discharge: 2017-07-06 | Disposition: A | Discharge status: Home / Self Care | Payer: BLUE CROSS/BLUE SHIELD | Source: Ambulatory Visit | Attending: Obstetrics and Gynecology | Admitting: Obstetrics and Gynecology

## 2017-07-06 VITALS — BP 108/62 | HR 86 | Temp 98.3°F | Resp 18 | Wt 291.8 lb

## 2017-07-06 DIAGNOSIS — D649 Anemia, unspecified: Secondary | ICD-10-CM

## 2017-07-06 DIAGNOSIS — M069 Rheumatoid arthritis, unspecified: Secondary | ICD-10-CM | POA: Diagnosis not present

## 2017-07-06 DIAGNOSIS — G2581 Restless legs syndrome: Secondary | ICD-10-CM

## 2017-07-06 DIAGNOSIS — I10 Essential (primary) hypertension: Secondary | ICD-10-CM

## 2017-07-06 DIAGNOSIS — J0110 Acute frontal sinusitis, unspecified: Secondary | ICD-10-CM | POA: Diagnosis not present

## 2017-07-06 DIAGNOSIS — M797 Fibromyalgia: Secondary | ICD-10-CM

## 2017-07-06 DIAGNOSIS — E1140 Type 2 diabetes mellitus with diabetic neuropathy, unspecified: Secondary | ICD-10-CM

## 2017-07-06 DIAGNOSIS — N6320 Unspecified lump in the left breast, unspecified quadrant: Secondary | ICD-10-CM

## 2017-07-06 DIAGNOSIS — E6609 Other obesity due to excess calories: Secondary | ICD-10-CM | POA: Diagnosis not present

## 2017-07-06 DIAGNOSIS — E559 Vitamin D deficiency, unspecified: Secondary | ICD-10-CM | POA: Diagnosis not present

## 2017-07-06 DIAGNOSIS — Z79899 Other long term (current) drug therapy: Secondary | ICD-10-CM

## 2017-07-06 DIAGNOSIS — E782 Mixed hyperlipidemia: Secondary | ICD-10-CM | POA: Diagnosis not present

## 2017-07-06 DIAGNOSIS — Z23 Encounter for immunization: Secondary | ICD-10-CM | POA: Diagnosis not present

## 2017-07-06 MED ORDER — DOXYCYCLINE HYCLATE 100 MG PO TABS: 100.0000 mg | ORAL_TABLET | Freq: Two times a day (BID) | ORAL | 0 refills | Status: DC

## 2017-07-06 MED ORDER — FERROUS FUMARATE-FOLIC ACID 324-1 MG PO TABS: 1.0000 | ORAL_TABLET | Freq: Every day | ORAL | 5 refills | Status: DC

## 2017-07-06 MED ORDER — ZOSTER VAC RECOMB ADJUVANTED 50 MCG/0.5ML IM SUSR: 0.5000 mL | Freq: Once | INTRAMUSCULAR | 1 refills | Status: AC

## 2017-07-06 MED ORDER — ALPRAZOLAM 0.25 MG PO TABS: 0.2500 mg | ORAL_TABLET | Freq: Two times a day (BID) | ORAL | 2 refills | Status: DC | PRN

## 2017-07-06 NOTE — Patient Instructions (Addendum)
Shingrix is the new shingles shot, 2 shots over 2 to 6 months, call insurance and confirm they will pay then go to [facility] to get it   Recommend calcium intake of 1200 to 1500 mg daily, divided into roughly 3 doses. Best source is the diet and a single dairy serving is about 500 mg, a supplement of calcium citrate once or twice daily to balance diet is fine if not getting enough in diet. Also need Vitamin D 2000 IU caps, 1 cap daily if not already taking vitamin D. Also recommend weight baring exercise on hips and upper body to keep bones strong  consider rubbing feet down with Menthol and/or Lidocaine  Sinusitis, Adult Sinusitis is soreness and inflammation of your sinuses. Sinuses are hollow spaces in the bones around your face. They are located:  Around your eyes.  In the middle of your forehead.  Behind your nose.  In your cheekbones.  Your sinuses and nasal passages are lined with a stringy fluid (mucus). Mucus normally drains out of your sinuses. When your nasal tissues get inflamed or swollen, the mucus can get trapped or blocked so air cannot flow through your sinuses. This lets bacteria, viruses, and funguses grow, and that leads to infection. Follow these instructions at home: Medicines  Take, use, or apply over-the-counter and prescription medicines only as told by your doctor. These may include nasal sprays.  If you were prescribed an antibiotic medicine, take it as told by your doctor. Do not stop taking the antibiotic even if you start to feel better. Hydrate and Humidify  Drink enough water to keep your pee (urine) clear or pale yellow.  Use a cool mist humidifier to keep the humidity level in your home above 50%.  Breathe in steam for 10-15 minutes, 3-4 times a day or as told by your doctor. You can do this in the bathroom while a hot shower is running.  Try not to spend time in cool or dry air. Rest  Rest as much as possible.  Sleep with your  head raised (elevated).  Make sure to get enough sleep each night. General instructions  Put a warm, moist washcloth on your face 3-4 times a day or as told by your doctor. This will help with discomfort.  Wash your hands often with soap and water. If there is no soap and water, use hand sanitizer.  Do not smoke. Avoid being around people who are smoking (secondhand smoke).  Keep all follow-up visits as told by your doctor. This is important. Contact a doctor if:  You have a fever.  Your symptoms get worse.  Your symptoms do not get better within 10 days. Get help right away if:  You have a very bad headache.  You cannot stop throwing up (vomiting).  You have pain or swelling around your face or eyes.  You have trouble seeing.  You feel confused.  Your neck is stiff.  You have trouble breathing. This information is not intended to replace advice given to you by your health care provider. Make sure you discuss any questions you have with your health care provider. Document Released: 11/01/2007 Document Revised: 01/09/2016 Document Reviewed: 03/10/2015 Elsevier Interactive Patient Education  [employer].

## 2017-07-06 NOTE — Assessment & Plan Note (Signed)
Encouraged increased rest and hydration, add probiotics, zinc such as Coldeze or Xicam. Treat fevers as needed, elderberry, vit c. Start Doxycyline

## 2017-07-06 NOTE — Progress Notes (Signed)
Subjective:  I acted as a Education administrator for Dr. Charlett Blake. [place], [place]  Patient ID: [name], female    DOB: 11-Dec-1955, 62 y.o.   MRN: [id number]  No chief complaint on file.   HPI  Patient is in today for 1 month follow up and is complaining of congestion with some headache and malaise. No fevers but some cough and sputum production is noted. She is pleased with her response to Amitriptyline she notes her TMJ and RLS are much better but she notes her paresthesias in her feet from her neuropathy. No recent hospitalizations. Denies CP/palp/SOB/HA/fevers/GI or GU c/o. Taking meds as prescribed  Patient Care Team: Mosie Lukes, MD as PCP - General (Family Medicine)   Past Medical History:  Diagnosis Date  . Allergic rhinitis   . Allergy   . Anemia 07/17/2016  . Anxiety   . Asthma    seasonal  . Blood transfusion without reported diagnosis   . Breast cancer ([facility])    right  . Bronchitis   . Colon polyps   . Depression   . Diabetes mellitus    borderline but takes metformin  . Edema   . Family history of breast cancer in first degree relative   . GERD (gastroesophageal reflux disease)   . Hyperglycemia 03/05/2017  . Hyperlipidemia 08/21/2016   no meds  . Hyperparathyroidism   . Hypertension    controlled by medications  . Neuromuscular disorder (HCC)    Fibromyalgia  . Obesity   . Osteoarthritis    RA  . PONV (postoperative nausea and vomiting)    occasional  . Sleep apnea    wears cpap  . Viral meningitis   . Vitamin D deficiency     Past Surgical History:  Procedure Laterality Date  . ABDOMINAL HYSTERECTOMY  1998  . ADENOIDECTOMY    . BREAST EXCISIONAL BIOPSY Left 1993   benign  . BREAST SURGERY     Tran flap due to breast cancer  . CESAREAN SECTION  1988  . COLONOSCOPY  08/14/2011  . HAND SURGERY     dog bite, right hand  . HAND SURGERY     trauma, left hand  . KNEE ARTHROSCOPY     bilateral  . left knee replacement  2010  . MASTECTOMY  01/13/94   Right breast  . parathyroid resection    . PARATHYROIDECTOMY    . POLYPECTOMY    . rectal abscess    . SEPTOPLASTY  1980  . SHOULDER ARTHROSCOPY Right 11/09/2015   Procedure: ARTHROSCOPY SHOULDER-acromioplasty, distal clavicle resection and debridement;  Surgeon: Melrose Nakayama, MD;  Location: [facility];  Service: Orthopedics;  Laterality: Right;  . shoulder arthroscopy     rotator cuff repair  . TOE SURGERY Right    paronychia and adenoma removed  . TONSILLECTOMY    . TOTAL KNEE ARTHROPLASTY  06/18/08   [facility]  . TUMOR REMOVAL  1982   , scalp    Family History  Problem Relation Age of Onset  . Emphysema Father   . Lung cancer Father        lung ca dx 42  . Other Father        prostate issues  . Breast cancer Maternal Aunt        dx late 79s  . Colon cancer Maternal Aunt        dx 56s  . Colon polyps Maternal Aunt   . Prostate cancer Maternal Grandfather 55  d. 85y  . Heart disease Paternal Grandfather   . Heart attack Paternal Grandfather        d. 50y  . Diabetes Paternal Grandfather   . Asthma Daughter        "seasonal"  . Arthritis Maternal Grandmother   . Aneurysm Maternal Grandmother        d. brain aneurysm at 54  . Arthritis Mother   . Colon polyps Mother   . Multiple sclerosis Sister   . Breast cancer Sister 22       L IDC and DCIS; ER/PR+, Her2-  . Fibrocystic breast disease Sister   . Arthritis/Rheumatoid Sister   . Cancer Maternal Uncle        d. mouth cancer at younger age; smoker  . Other Paternal Uncle        muscle issues - couldn't walk or talk; d. 20y  . Cancer Maternal Aunt        lymphoma, dx 39s  . Ovarian cancer Maternal Aunt        dx 66s; d. late 60s  . Lung cancer Maternal Uncle        d. 1y; former smoker  . Throat cancer Cousin        maternal 1st cousin; smoker  . Breast cancer Cousin 11       maternal 1st cousin  . Other Paternal Uncle        prostate issues  . Breast cancer Cousin        paternal 1st cousin dx late 67s    . Throat cancer Cousin        maternal 1st cousin; used SL tobacco  . Prostate cancer Cousin        maternal 1st cousin dx 33s  . Esophageal cancer Neg Hx   . Rectal cancer Neg Hx   . Stomach cancer Neg Hx     Social History   Socioeconomic History  . Marital status: Widowed    Spouse name: Not on file  . Number of children: 1  . Years of education: Not on file  . Highest education level: Not on file  Social Needs  . Financial resource strain: Not on file  . Food insecurity - worry: Not on file  . Food insecurity - inability: Not on file  . Transportation needs - medical: Not on file  . Transportation needs - non-medical: Not on file  Occupational History  . Occupation: ACCOUNTING    Employer: [employer]  Tobacco Use  . Smoking status: Never Smoker  . Smokeless tobacco: Never Used  Substance and Sexual Activity  . Alcohol use: Yes    Comment: occ.  . Drug use: No  . Sexual activity: Not on file  Other Topics Concern  . Not on file  Social History Narrative  . Not on file    Outpatient Medications Prior to Visit  Medication Sig Dispense Refill  . acetic acid-hydrocortisone (VOSOL-HC) OTIC solution Place 3 drops into both ears 3 (three) times daily. 10 mL 0  . albuterol (VENTOLIN HFA) 108 (90 Base) MCG/ACT inhaler Inhale 2 puffs into the lungs every 6 (six) hours as needed for wheezing or shortness of breath. 18 g 1  . amitriptyline (ELAVIL) 10 MG tablet Take 1-3 tablets (10-30 mg total) by mouth at bedtime. 90 tablet 1  . Calcium Carbonate (CALTRATE 600) 1500 MG TABS Take 1,500 mg by mouth daily.     . cetirizine (ZYRTEC) 10 MG tablet TAKE ONE TABLET  BY MOUTH TWICE DAILY 60 tablet 5  . Cholecalciferol (VITAMIN D PO) Take 1 tablet by mouth daily.    . diclofenac (VOLTAREN) 75 MG EC tablet Take 1 tablet (75 mg total) by mouth 2 (two) times daily. 20 tablet 0  . doxycycline (VIBRA-TABS) 100 MG tablet Take 1 tablet (100 mg total) by mouth 2 (two) times daily. 20  tablet 0  . EPINEPHrine 0.3 mg/0.3 mL IJ SOAJ injection Inject 0.3 mLs (0.3 mg total) into the muscle once as needed (Throat or facial swelling or difficulty breathing). 1 Device 0  . esomeprazole (NEXIUM) 40 MG capsule Take 1 capsule (40 mg total) by mouth daily. 90 capsule 1  . famotidine (PEPCID) 20 MG tablet Take 1 tablet (20 mg total) by mouth daily. 90 tablet 0  . febuxostat (ULORIC) 40 MG tablet Take 1 tablet (40 mg total) by mouth daily. 90 tablet 1  . Ferrous Fumarate-Folic Acid (HEMOCYTE-F) 324-1 MG TABS Take 1 tablet by mouth daily. 30 each 6  . fluocinonide (LIDEX) 0.05 % external solution As directed    . fluticasone (FLONASE) 50 MCG/ACT nasal spray Place 2 sprays into both nostrils daily. (Patient taking differently: Place 2 sprays into both nostrils daily as needed for allergies. ) 16 g 1  . furosemide (LASIX) 40 MG tablet TAKE ONE TABLET BY MOUTH TWICE DAILY 60 tablet 3  . Ginger 500 MG CAPS Take 500 mg by mouth daily.     Marland Kitchen HYDROcodone-acetaminophen (NORCO) 5-325 MG tablet Take 1-2 tablets by mouth every 6 (six) hours as needed for moderate pain. 16 tablet 0  . KRILL OIL PO Take 1 capsule by mouth daily.    . metFORMIN (GLUCOPHAGE) 500 MG tablet TAKE ONE TABLET BY MOUTH TWICE DAILY WITH MEALS 180 tablet 1  . montelukast (SINGULAIR) 10 MG tablet Take 1 tablet (10 mg total) by mouth at bedtime. 30 tablet 5  . Multiple Vitamins-Minerals (MULTIVITAMIN & MINERAL PO) Take 1 tablet by mouth daily.    Marland Kitchen OVER THE COUNTER MEDICATION Apple cider vinegar tablets 3 once a day    . Probiotic Product (PROBIOTIC ADVANCED PO) Take 1 capsule by mouth daily.    . ranitidine (ZANTAC) 150 MG tablet TAKE ONE TABLET BY MOUTH TWICE DAILY 60 tablet 5  . rOPINIRole (REQUIP) 0.5 MG tablet Take 1 tablet (0.5 mg total) by mouth 3 (three) times daily. 30 tablet 1  . sertraline (ZOLOFT) 50 MG tablet Take 1 tablet (50 mg total) by mouth daily. 90 tablet 1  . telmisartan-hydrochlorothiazide (MICARDIS HCT) 80-25  MG tablet TAKE 1 TABLET BY MOUTH ONCE DAILY 90 tablet 1  . Turmeric 500 MG CAPS Take 500 mg by mouth daily.     Marland Kitchen ALPRAZolam (XANAX) 0.25 MG tablet Take 1 tablet (0.25 mg total) by mouth 2 (two) times daily as needed for anxiety. 30 tablet 1   No facility-administered medications prior to visit.     Allergies  Allergen Reactions  . Allopurinol Hives  . Mucinex [Guaifenesin Er] Shortness Of Breath  . Penicillins Hives and Other (See Comments)    Has patient had a PCN reaction causing immediate rash, facial/tongue/throat swelling, SOB or lightheadedness with hypotension: no Has patient had a PCN reaction causing severe rash involving mucus membranes or skin necrosis: no Has patient had a PCN reaction that required hospitalization no Has patient had a PCN reaction occurring within the last 10 years: no If all of the above answers are "NO", then may proceed with Cephalosporin use.   Marland Kitchen  Prozac [Fluoxetine Hcl] Hives  . Lexapro [Escitalopram Oxalate]     Nausea and hypersalivation.     Review of Systems  Constitutional: Negative for chills, fever and malaise/fatigue.  HENT: Positive for congestion. Negative for hearing loss.   Eyes: Negative for discharge.  Respiratory: Positive for cough and sputum production. Negative for shortness of breath.   Cardiovascular: Negative for chest pain, palpitations and leg swelling.  Gastrointestinal: Negative for abdominal pain, blood in stool, constipation, diarrhea, heartburn, nausea and vomiting.  Genitourinary: Negative for dysuria, frequency, hematuria and urgency.  Musculoskeletal: Positive for back pain and joint pain. Negative for falls and myalgias.  Skin: Negative for rash.  Neurological: Positive for sensory change and headaches. Negative for dizziness, loss of consciousness and weakness.  Endo/Heme/Allergies: Negative for environmental allergies. Does not bruise/bleed easily.  Psychiatric/Behavioral: Negative for depression and suicidal  ideas. The patient is not nervous/anxious and does not have insomnia.        Objective:    Physical Exam  Constitutional: She is oriented to person, place, and time. She appears well-developed and well-nourished. No distress.  HENT:  Head: Normocephalic and atraumatic.  Nose: Nose normal.  Eyes: Right eye exhibits no discharge. Left eye exhibits no discharge.  Neck: Normal range of motion. Neck supple.  Cardiovascular: Normal rate and regular rhythm.  No murmur heard. Pulmonary/Chest: Effort normal and breath sounds normal.  Abdominal: Soft. Bowel sounds are normal. There is no tenderness.  Musculoskeletal: She exhibits no edema.  Neurological: She is alert and oriented to person, place, and time.  Skin: Skin is warm and dry.  Psychiatric: She has a normal mood and affect.  Nursing note and vitals reviewed.   BP 108/62 (BP Location: Left Arm, Patient Position: Sitting, Cuff Size: Normal)   Pulse 86   Temp 98.3 F (36.8 C) (Oral)   Resp 18   Wt 291 lb 12.8 oz (132.4 kg)   SpO2 97%   BMI 47.10 kg/m  Wt Readings from Last 3 Encounters:  07/06/17 291 lb 12.8 oz (132.4 kg)  06/05/17 291 lb 3.2 oz (132.1 kg)  04/27/17 280 lb 2 oz (127.1 kg)   BP Readings from Last 3 Encounters:  07/06/17 108/62  06/05/17 118/68  04/27/17 128/80     Immunization History  Administered Date(s) Administered  . Influenza Split 03/27/2011, 03/29/2012  . Influenza Whole 04/07/2009, 03/28/2010  . Influenza,inj,Quad PF,6+ Mos 02/26/2013, 01/28/2016, 03/05/2017  . Pneumococcal Polysaccharide-23 04/03/2014  . Td 04/29/2009  . Zoster 01/28/2016    Health Maintenance  Topic Date Due  . Hepatitis C Screening  [birth year]  . HIV Screening  07/23/1969  . OPHTHALMOLOGY EXAM  06/13/2016  . PAP SMEAR  03/31/2017  . FOOT EXAM  07/11/2017  . MAMMOGRAM  09/13/2017  . HEMOGLOBIN A1C  12/03/2017  . PNEUMOCOCCAL POLYSACCHARIDE VACCINE (2) 04/04/2019  . TETANUS/TDAP  04/30/2019  . COLONOSCOPY   09/06/2021  . INFLUENZA VACCINE  Completed    Lab Results  Component Value Date   WBC 6.7 06/05/2017   HGB 11.6 (L) 06/05/2017   HCT 36.0 06/05/2017   PLT 240.0 06/05/2017   GLUCOSE 123 (H) 06/05/2017   CHOL 155 06/05/2017   TRIG 144.0 06/05/2017   HDL 46.20 06/05/2017   LDLDIRECT 110.0 08/21/2016   LDLCALC 80 06/05/2017   ALT 16 06/05/2017   AST 17 06/05/2017   NA 137 06/05/2017   K 3.9 06/05/2017   CL 96 06/05/2017   CREATININE 0.98 06/05/2017   BUN 23 06/05/2017  CO2 35 (H) 06/05/2017   TSH 1.00 06/05/2017   INR 2.0 (H) 06/21/2008   HGBA1C 6.4 06/05/2017    Lab Results  Component Value Date   TSH 1.00 06/05/2017   Lab Results  Component Value Date   WBC 6.7 06/05/2017   HGB 11.6 (L) 06/05/2017   HCT 36.0 06/05/2017   MCV 86.6 06/05/2017   PLT 240.0 06/05/2017   Lab Results  Component Value Date   NA 137 06/05/2017   K 3.9 06/05/2017   CO2 35 (H) 06/05/2017   GLUCOSE 123 (H) 06/05/2017   BUN 23 06/05/2017   CREATININE 0.98 06/05/2017   BILITOT 0.3 06/05/2017   ALKPHOS 69 06/05/2017   AST 17 06/05/2017   ALT 16 06/05/2017   PROT 7.0 06/05/2017   ALBUMIN 4.1 06/05/2017   CALCIUM 9.8 06/05/2017   ANIONGAP 11 11/09/2015   GFR 60.95 06/05/2017   Lab Results  Component Value Date   CHOL 155 06/05/2017   Lab Results  Component Value Date   HDL 46.20 06/05/2017   Lab Results  Component Value Date   LDLCALC 80 06/05/2017   Lab Results  Component Value Date   TRIG 144.0 06/05/2017   Lab Results  Component Value Date   CHOLHDL 3 06/05/2017   Lab Results  Component Value Date   HGBA1C 6.4 06/05/2017         Assessment & Plan:   Problem List Items Addressed This Visit    Vitamin D deficiency    Daily supplements and monitor      Essential hypertension    Well controlled, no changes to meds. Encouraged heart healthy diet such as the DASH diet and exercise as tolerated.       Acute sinusitis    Encouraged increased rest and  hydration, add probiotics, zinc such as Coldeze or Xicam. Treat fevers as needed, elderberry, vit c. Start Doxycyline      Relevant Medications   doxycycline (VIBRA-TABS) 100 MG tablet   Fibromyalgia    Struggling with chronic pain and if she exerts herself she is disabled for the day      Obesity    Maintain heart healthy diet, decrease po intake and increase exercise as tolerated. Needs 7-8 hours of sleep nightly. Avoid trans fats, eat small, frequent meals every 4-5 hours with lean proteins, complex carbs and healthy fats. Minimize simple carbs      Diabetes mellitus, type 2 (HCC)    hgba1c acceptable, minimize simple carbs. Increase exercise as tolerated. Continue current meds      Rheumatoid arthritis ([place])    Continues to struggle with daily pain but Amitriptyline has been helpful      Anemia    Doing well on daily iron supplements      Relevant Medications   Ferrous Fumarate-Folic Acid (HEMOCYTE-F) 324-1 MG TABS   Hyperlipidemia    Encouraged heart healthy diet, increase exercise, avoid trans fats, consider a krill oil cap daily      RLS (restless legs syndrome)    Good response to Elavil at 20 mg can use up to 30 mg as needed       Other Visit Diagnoses    High risk medication use    -  Primary   Relevant Orders   Pain Mgmt, Profile 8 w/Conf, U (Completed)   Need for viral immunization          I am having [name] start on Ferrous Fumarate-Folic Acid, Zoster Vaccine Adjuvanted, and  doxycycline. I am also having her maintain her calcium carbonate, Multiple Vitamins-Minerals (MULTIVITAMIN & MINERAL PO), Ginger, Turmeric, fluticasone, Cholecalciferol (VITAMIN D PO), EPINEPHrine, montelukast, Ferrous Fumarate-Folic Acid, albuterol, KRILL OIL PO, Probiotic Product (PROBIOTIC ADVANCED PO), OVER THE COUNTER MEDICATION, diclofenac, furosemide, acetic acid-hydrocortisone, cetirizine, ranitidine, HYDROcodone-acetaminophen, metFORMIN, famotidine, esomeprazole,  sertraline, fluocinonide, telmisartan-hydrochlorothiazide, febuxostat, amitriptyline, rOPINIRole, doxycycline, and ALPRAZolam.  Meds ordered this encounter  Medications  . ALPRAZolam (XANAX) 0.25 MG tablet    Sig: Take 1 tablet (0.25 mg total) by mouth 2 (two) times daily as needed for anxiety.    Dispense:  30 tablet    Refill:  2  . Ferrous Fumarate-Folic Acid (HEMOCYTE-F) 324-1 MG TABS    Sig: Take 1 tablet by mouth daily.    Dispense:  30 each    Refill:  5  . Zoster Vaccine Adjuvanted [facility]) injection    Sig: Inject 0.5 mLs into the muscle once for 1 dose.    Dispense:  0.5 mL    Refill:  1  . doxycycline (VIBRA-TABS) 100 MG tablet    Sig: Take 1 tablet (100 mg total) by mouth 2 (two) times daily.    Dispense:  20 tablet    Refill:  0    CMA served as scribe during this visit. History, Physical and Plan performed by medical provider. Documentation and orders reviewed and attested to.  Penni Homans, MD

## 2017-07-06 NOTE — Assessment & Plan Note (Signed)
Struggling with chronic pain and if she exerts herself she is disabled for the day

## 2017-07-06 NOTE — Assessment & Plan Note (Signed)
Well controlled, no changes to meds. Encouraged heart healthy diet such as the DASH diet and exercise as tolerated.  °

## 2017-07-07 LAB — PAIN MGMT, PROFILE 8 W/CONF, U
6 Acetylmorphine: NEGATIVE ng/mL (ref <10)
Alcohol Metabolites: NEGATIVE ng/mL (ref <500)
Amphetamines: NEGATIVE ng/mL (ref <500)
Benzodiazepines: NEGATIVE ng/mL (ref <100)
Buprenorphine, Urine: NEGATIVE ng/mL (ref <5)
Cocaine Metabolite: NEGATIVE ng/mL (ref <150)
Creatinine: 153.1 mg/dL
MDMA: NEGATIVE ng/mL (ref <500)
Marijuana Metabolite: NEGATIVE ng/mL (ref <20)
Opiates: NEGATIVE ng/mL (ref <100)
Oxidant: NEGATIVE ug/mL (ref <200)
Oxycodone: NEGATIVE ng/mL (ref <100)
pH: 5.82 (ref 4.5–9.0)

## 2017-07-08 NOTE — Assessment & Plan Note (Signed)
Continues to struggle with daily pain but Amitriptyline has been helpful

## 2017-07-08 NOTE — Assessment & Plan Note (Signed)
Good response to Elavil at 20 mg can use up to 30 mg as needed

## 2017-07-08 NOTE — Assessment & Plan Note (Signed)
Maintain heart healthy diet, decrease po intake and increase exercise as tolerated. Needs 7-8 hours of sleep nightly. Avoid trans fats, eat small, frequent meals every 4-5 hours with lean proteins, complex carbs and healthy fats. Minimize simple carbs

## 2017-07-08 NOTE — Assessment & Plan Note (Signed)
Doing well on daily iron supplements

## 2017-07-08 NOTE — Assessment & Plan Note (Signed)
Encouraged heart healthy diet, increase exercise, avoid trans fats, consider a krill oil cap daily 

## 2017-07-08 NOTE — Assessment & Plan Note (Signed)
hgba1c acceptable, minimize simple carbs. Increase exercise as tolerated. Continue current meds 

## 2017-07-08 NOTE — Assessment & Plan Note (Signed)
Daily supplements and monitor 

## 2017-07-10 ENCOUNTER — Encounter: Payer: Self-pay | Admitting: Family Medicine

## 2017-07-16 ENCOUNTER — Telehealth: Payer: Self-pay | Admitting: Family Medicine

## 2017-07-16 NOTE — Telephone Encounter (Signed)
Patient phoned for an appointment because of a large swollen lymph node under her jaw. She saw Dr. Charlett Blake on 07/06/17 and was treated for sinusitis with Doxycycline 100 mg tabs. She did report that she did not begin the antibiotic until 4 days ago, Friday 2/15 for two reasons: Pharmacy did not have the medicine in stock until 3 days later-by that time she felt better.She began feeling worse on Friday, 2/15 with the same symptoms-sinus pressure, sinus congestion, cough with brown phlegm. During our conversation her cough was nonstop to the point she could barely talk. She has tried Delsym with some relief but not much, uses nettie pot-has not helped at all. Appointment made with Dr. Larose Kells as One Note reads do not schedule further appointments with Dr. Charlett Blake, for now.

## 2017-07-17 ENCOUNTER — Encounter: Payer: Self-pay | Admitting: Internal Medicine

## 2017-07-17 ENCOUNTER — Ambulatory Visit: Payer: BLUE CROSS/BLUE SHIELD | Admitting: Internal Medicine

## 2017-07-17 ENCOUNTER — Telehealth: Payer: Self-pay | Admitting: Internal Medicine

## 2017-07-17 VITALS — BP 132/68 | HR 68 | Temp 98.6°F | Resp 14 | Ht 66.0 in | Wt 294.0 lb

## 2017-07-17 DIAGNOSIS — J40 Bronchitis, not specified as acute or chronic: Secondary | ICD-10-CM | POA: Diagnosis not present

## 2017-07-17 MED ORDER — AZELASTINE HCL 0.1 % NA SOLN
2.0000 | Freq: Every evening | NASAL | 3 refills | Status: DC | PRN
Start: 2017-07-17 — End: 2019-03-25

## 2017-07-17 MED ORDER — HYDROCODONE-HOMATROPINE 5-1.5 MG/5ML PO SYRP: 5.0000 mL | ORAL_SOLUTION | Freq: Every evening | ORAL | 0 refills | Status: DC | PRN

## 2017-07-17 MED ORDER — AZITHROMYCIN 250 MG PO TABS: ORAL_TABLET | ORAL | 0 refills | Status: DC

## 2017-07-17 MED ORDER — PREDNISONE 10 MG PO TABS: ORAL_TABLET | ORAL | 0 refills | Status: DC

## 2017-07-17 NOTE — Progress Notes (Signed)
Subjective:    Patient ID: [name], female    DOB: [birth year], 62 y.o.   MRN: [id number]  DOS:  07/17/2017 Type of visit - description : acute  Interval history: Symptoms started almost 3 weeks ago, was seen 07/06/2017 by PCP, prescribed antibiotics. She was not getting better so started doxycycline 07/13/2017. She is here because she is not feeling much better, continue with upper respiratory symptoms and b/c 2 days ago a gland at the left side of the neck started to get bigger and painful.  Review of Systems No fever chills Denies any difficulty swallowing or difficulty breathing (except for nasal congestion) Continue with cough, sometimes intense.  No wheezing that she can tell.   Past Medical History:  Diagnosis Date  . Allergic rhinitis   . Allergy   . Anemia 07/17/2016  . Anxiety   . Asthma    seasonal  . Blood transfusion without reported diagnosis   . Breast cancer ([place])    right  . Bronchitis   . Colon polyps   . Depression   . Diabetes mellitus    borderline but takes metformin  . Edema   . Family history of breast cancer in first degree relative   . GERD (gastroesophageal reflux disease)   . Hyperglycemia 03/05/2017  . Hyperlipidemia 08/21/2016   no meds  . Hyperparathyroidism   . Hypertension    controlled by medications  . Neuromuscular disorder ([facility])    Fibromyalgia  . Obesity   . Osteoarthritis    RA  . PONV (postoperative nausea and vomiting)    occasional  . Sleep apnea    wears cpap  . Viral meningitis   . Vitamin D deficiency     Past Surgical History:  Procedure Laterality Date  . ABDOMINAL HYSTERECTOMY  1998  . ADENOIDECTOMY    . BREAST EXCISIONAL BIOPSY Left 1993   benign  . BREAST SURGERY     Tran flap due to breast cancer  . CESAREAN SECTION  1988  . COLONOSCOPY  08/14/2011  . HAND SURGERY     dog bite, right hand  . HAND SURGERY     trauma, left hand  . KNEE ARTHROSCOPY     bilateral  . left knee replacement  2010  .  MASTECTOMY  01/13/94   Right breast  . parathyroid resection    . PARATHYROIDECTOMY    . POLYPECTOMY    . rectal abscess    . SEPTOPLASTY  1980  . SHOULDER ARTHROSCOPY Right 11/09/2015   Procedure: ARTHROSCOPY SHOULDER-acromioplasty, distal clavicle resection and debridement;  Surgeon: Melrose Nakayama, MD;  Location: [facility];  Service: Orthopedics;  Laterality: Right;  . shoulder arthroscopy     rotator cuff repair  . TOE SURGERY Right    paronychia and adenoma removed  . TONSILLECTOMY    . TOTAL KNEE ARTHROPLASTY  06/18/08   [facility]  . TUMOR REMOVAL  1982   , scalp    Social History   Socioeconomic History  . Marital status: Widowed    Spouse name: Not on file  . Number of children: 1  . Years of education: Not on file  . Highest education level: Not on file  Social Needs  . Financial resource strain: Not on file  . Food insecurity - worry: Not on file  . Food insecurity - inability: Not on file  . Transportation needs - medical: Not on file  . Transportation needs - non-medical: Not on file  Occupational History  .  Occupation: ACCOUNTING    Employer: [employer]  Tobacco Use  . Smoking status: Never Smoker  . Smokeless tobacco: Never Used  Substance and Sexual Activity  . Alcohol use: Yes    Comment: occ.  . Drug use: No  . Sexual activity: Not on file  Other Topics Concern  . Not on file  Social History Narrative  . Not on file      Allergies as of 07/17/2017      Reactions   Allopurinol Hives   Mucinex [guaifenesin Er] Shortness Of Breath   Penicillins Hives, Other (See Comments)   Has patient had a PCN reaction causing immediate rash, facial/tongue/throat swelling, SOB or lightheadedness with hypotension: no Has patient had a PCN reaction causing severe rash involving mucus membranes or skin necrosis: no Has patient had a PCN reaction that required hospitalization no Has patient had a PCN reaction occurring within the last 10 years: no If all of the  above answers are "NO", then may proceed with Cephalosporin use.   Prozac [fluoxetine Hcl] Hives   Lexapro [escitalopram Oxalate]    Nausea and hypersalivation.       Medication List        Accurate as of 07/17/17 11:59 PM. Always use your most recent med list.          acetic acid-hydrocortisone OTIC solution Commonly known as:  VOSOL-HC Place 3 drops into both ears 3 (three) times daily.   albuterol 108 (90 Base) MCG/ACT inhaler Commonly known as:  VENTOLIN HFA Inhale 2 puffs into the lungs every 6 (six) hours as needed for wheezing or shortness of breath.   ALPRAZolam 0.25 MG tablet Commonly known as:  XANAX Take 1 tablet (0.25 mg total) by mouth 2 (two) times daily as needed for anxiety.   amitriptyline 10 MG tablet Commonly known as:  ELAVIL Take 1-3 tablets (10-30 mg total) by mouth at bedtime.   azelastine 0.1 % nasal spray Commonly known as:  ASTELIN Place 2 sprays into both nostrils at bedtime as needed for rhinitis. Use in each nostril as directed   azithromycin 250 MG tablet Commonly known as:  ZITHROMAX Z-PAK 2 tabs a day the first day, then 1 tab a day x 4 days   CALTRATE 600 1500 (600 Ca) MG Tabs tablet Generic drug:  calcium carbonate Take 1,500 mg by mouth daily.   cetirizine 10 MG tablet Commonly known as:  ZYRTEC TAKE ONE TABLET BY MOUTH TWICE DAILY   diclofenac 75 MG EC tablet Commonly known as:  VOLTAREN Take 1 tablet (75 mg total) by mouth 2 (two) times daily.   EPINEPHrine 0.3 mg/0.3 mL Soaj injection Commonly known as:  EPI-PEN Inject 0.3 mLs (0.3 mg total) into the muscle once as needed (Throat or facial swelling or difficulty breathing).   esomeprazole 40 MG capsule Commonly known as:  NEXIUM Take 1 capsule (40 mg total) by mouth daily.   famotidine 20 MG tablet Commonly known as:  PEPCID Take 1 tablet (20 mg total) by mouth daily.   Ferrous Fumarate-Folic Acid 161-0 MG Tabs Commonly known as:  HEMOCYTE-F Take 1 tablet by mouth  daily.   fluocinonide 0.05 % external solution Commonly known as:  LIDEX As directed   fluticasone 50 MCG/ACT nasal spray Commonly known as:  FLONASE Place 2 sprays into both nostrils daily.   furosemide 40 MG tablet Commonly known as:  LASIX TAKE ONE TABLET BY MOUTH TWICE DAILY   Ginger 500 MG Caps Take 500  mg by mouth daily.   HYDROcodone-homatropine 5-1.5 MG/5ML syrup Commonly known as:  HYCODAN Take 5 mLs by mouth at bedtime as needed for cough.   KRILL OIL PO Take 1 capsule by mouth daily.   metFORMIN 500 MG tablet Commonly known as:  GLUCOPHAGE TAKE ONE TABLET BY MOUTH TWICE DAILY WITH MEALS   montelukast 10 MG tablet Commonly known as:  SINGULAIR Take 1 tablet (10 mg total) by mouth at bedtime.   MULTIVITAMIN & MINERAL PO Take 1 tablet by mouth daily.   OVER THE COUNTER MEDICATION Apple cider vinegar tablets 3 once a day   predniSONE 10 MG tablet Commonly known as:  DELTASONE 2 tabs a day x 5 days   PROBIOTIC ADVANCED PO Take 1 capsule by mouth daily.   ranitidine 150 MG tablet Commonly known as:  ZANTAC TAKE ONE TABLET BY MOUTH TWICE DAILY   rOPINIRole 0.5 MG tablet Commonly known as:  REQUIP Take 1 tablet (0.5 mg total) by mouth 3 (three) times daily.   sertraline 50 MG tablet Commonly known as:  ZOLOFT Take 1 tablet (50 mg total) by mouth daily.   telmisartan-hydrochlorothiazide 80-25 MG tablet Commonly known as:  MICARDIS HCT TAKE 1 TABLET BY MOUTH ONCE DAILY   Turmeric 500 MG Caps Take 500 mg by mouth daily.   VITAMIN D PO Take 1 tablet by mouth daily.          Objective:   Physical Exam  Neck:     BP 132/68 (BP Location: Left Arm, Patient Position: Sitting, Cuff Size: Normal)   Pulse 68   Temp 98.6 F (37 C) (Oral)   Resp 14   Ht 5\' 6"  (1.676 m)   Wt 294 lb (133.4 kg)   SpO2 98%   BMI 47.45 kg/m  General:   Well developed, well nourished, NAD.  Frequent cough noted. HEENT:  Normocephalic . Face symmetric,  atraumatic. TMs: Normal Nose quite congested, sinuses no TTP Throat: Symmetric, not red, uvula midline.  No difficulty swallowing noted. Lungs:  CTA B Normal respiratory effort, no intercostal retractions, no accessory muscle use. Heart: RRR,  no murmur.  No pretibial edema bilaterally  Skin: Not pale. Not jaundice Neurologic:  alert & oriented X3.  Speech normal, gait appropriate for age and unassisted Psych--  Cognition and judgment appear intact.  Cooperative with normal attention span and concentration.  Behavior appropriate. No anxious or depressed appearing.      Assessment & Plan:   62 year old female with multiple medical problems including diabetes, HTN, DJD, gout, depression, breast cancer (no chemo-no XRT) presents with:  Bronchitis, mild sinusitis, LAD versus submandibular parotitis. Change doxycycline to Zithromax Fluids, Mucinex DM, Flonase, Astelin. Cough control-- hydrocodone. Call if not gradually better particularly if the gland is not back to normal.

## 2017-07-17 NOTE — Telephone Encounter (Signed)
Copied from [place]. Topic: Quick Communication - See Telephone Encounter >> Jul 17, 2017  3:59 PM Robina Ade, Helene Kelp D wrote: CRM for notification. See Telephone encounter for: 07/17/17. Patient called today to ask about her Zithromax that Dr. Larose Kells said she should start at todays visit. He told her to stop her doxycycline and start Zithromax but it was not sent to her pharmacy. Please call patient if this was a mistake and when it is sent in.

## 2017-07-17 NOTE — Telephone Encounter (Signed)
LMOM informing Pt that zpak has been sent- apologized for inconvenience.

## 2017-07-17 NOTE — Telephone Encounter (Signed)
Please call the patient, I am sorry about that, prescription sent just now.

## 2017-07-17 NOTE — Patient Instructions (Signed)
Rest, fluids , tylenol  For cough:  Take Mucinex DM twice a day as needed until better If the cough continue, use hydrocodone   For nasal congestion: Use OTC   Flonase : 2 nasal sprays on each side of the nose in the morning until you feel better Use ASTELIN a prescribed spray : 2 nasal sprays on each side of the nose at night until you feel better  Take prednisone for a few days. If your blood sugars are more than 200 stop prednisone and let us know  Stop doxycycline, start Zithromax   Call if not gradually better over the next  10 days  Call anytime if the symptoms are severe  Call if the gland at the left neck is not gradually decreasing in size, call if it gets bigger.

## 2017-07-17 NOTE — Progress Notes (Signed)
Pre visit review using our clinic review tool, if applicable. No additional management support is needed unless otherwise documented below in the visit note. 

## 2017-07-17 NOTE — Telephone Encounter (Signed)
Please advise 

## 2017-07-19 ENCOUNTER — Institutional Professional Consult (permissible substitution): Payer: Self-pay | Admitting: Pulmonary Disease

## 2017-08-01 ENCOUNTER — Encounter: Payer: Self-pay | Admitting: Family Medicine

## 2017-08-01 LAB — HM DIABETES EYE EXAM

## 2017-08-08 MED FILL — SHINGRIX 50 MCG SUS: 50 | 1 days supply | Qty: 1 | Fill #0

## 2017-08-13 ENCOUNTER — Other Ambulatory Visit: Payer: Self-pay | Admitting: Family Medicine

## 2017-08-23 ENCOUNTER — Ambulatory Visit: Payer: Self-pay | Admitting: Medical

## 2017-08-23 ENCOUNTER — Other Ambulatory Visit: Payer: Self-pay | Admitting: Family Medicine

## 2017-09-03 ENCOUNTER — Encounter: Payer: Self-pay | Admitting: Family Medicine

## 2017-09-05 IMAGING — MG MM SCREENING BREAST TOMO UNI L
6 series · 6 of 14 positions shown · non-contrast
Comparison: Previous exam(s).

ACR Breast Density Category a: The breast tissue is almost entirely
fatty.

CLINICAL DATA: Screening.

EXAM:
2D DIGITAL SCREENING UNILATERAL LEFT MAMMOGRAM WITH CAD AND ADJUNCT
TOMO

[L CC]
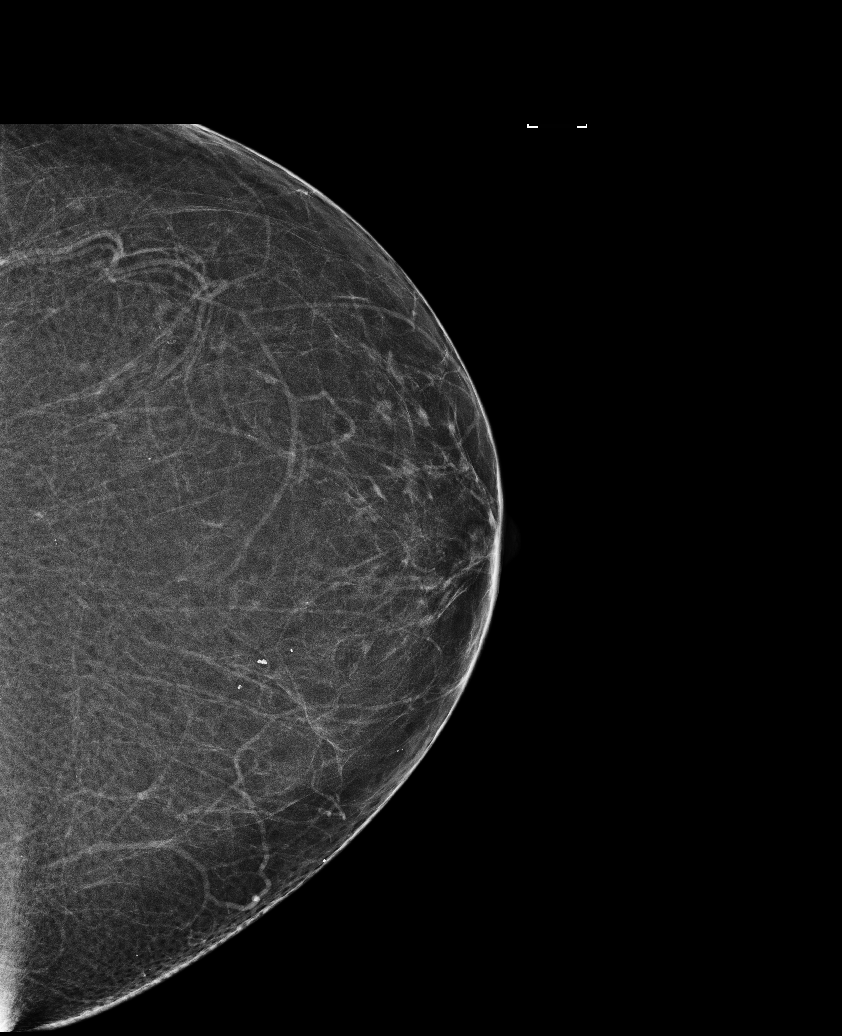

[L MLO synth-2D]
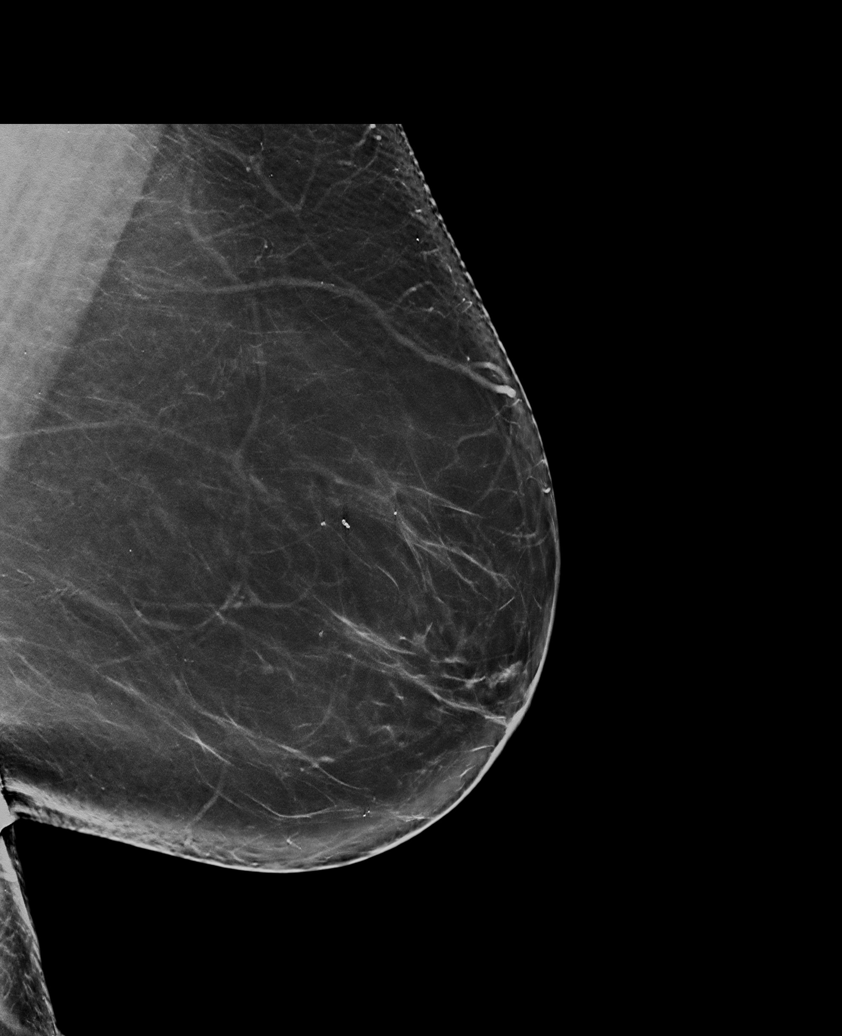

[L MLO]
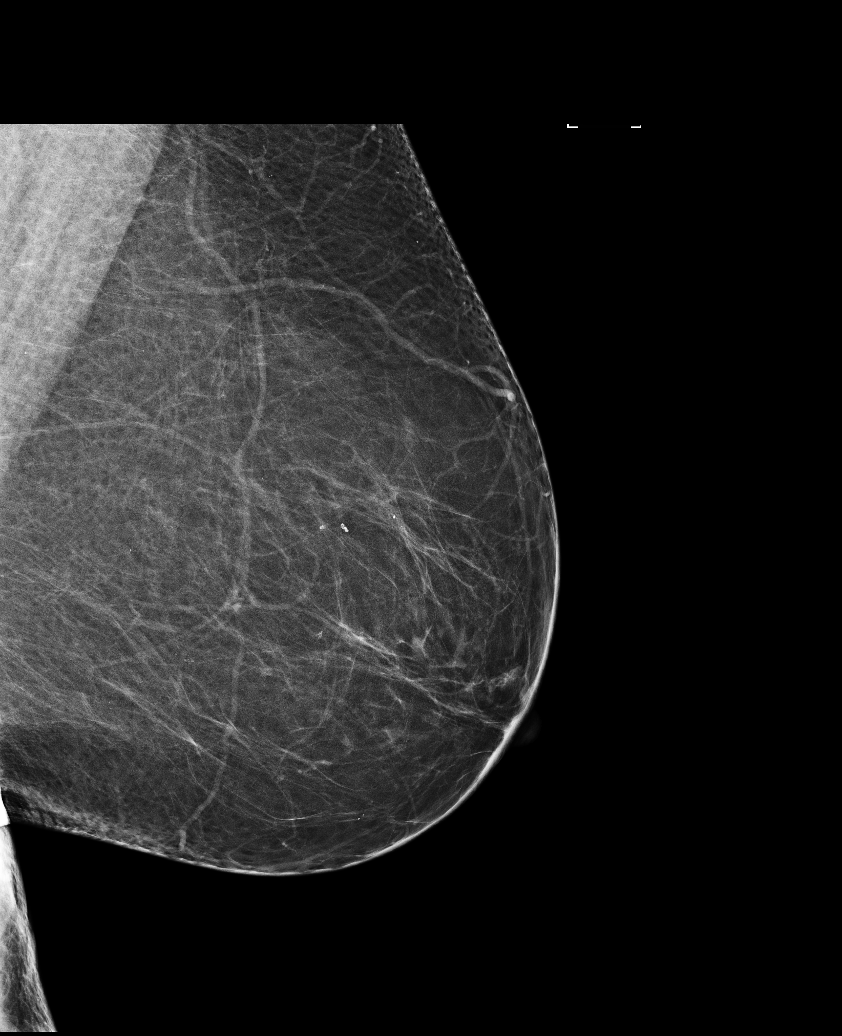

[L CC synth-2D]
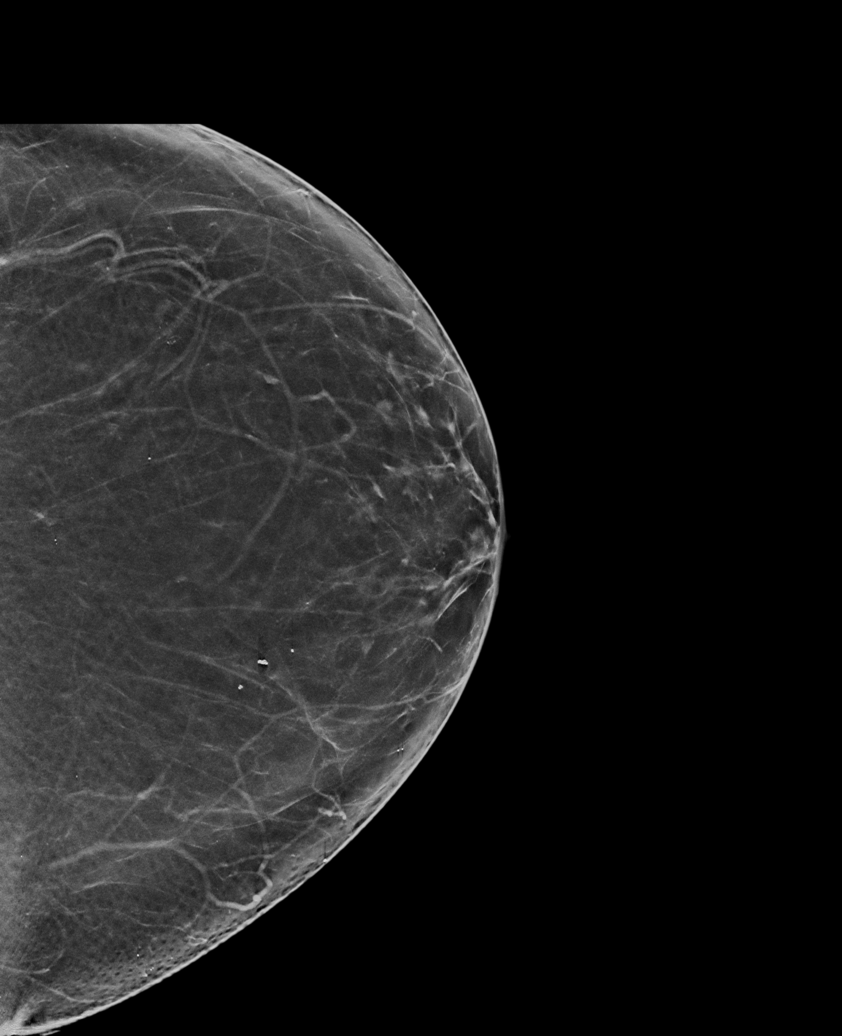

[L MLO tomo · tomo slice 46/91.0]
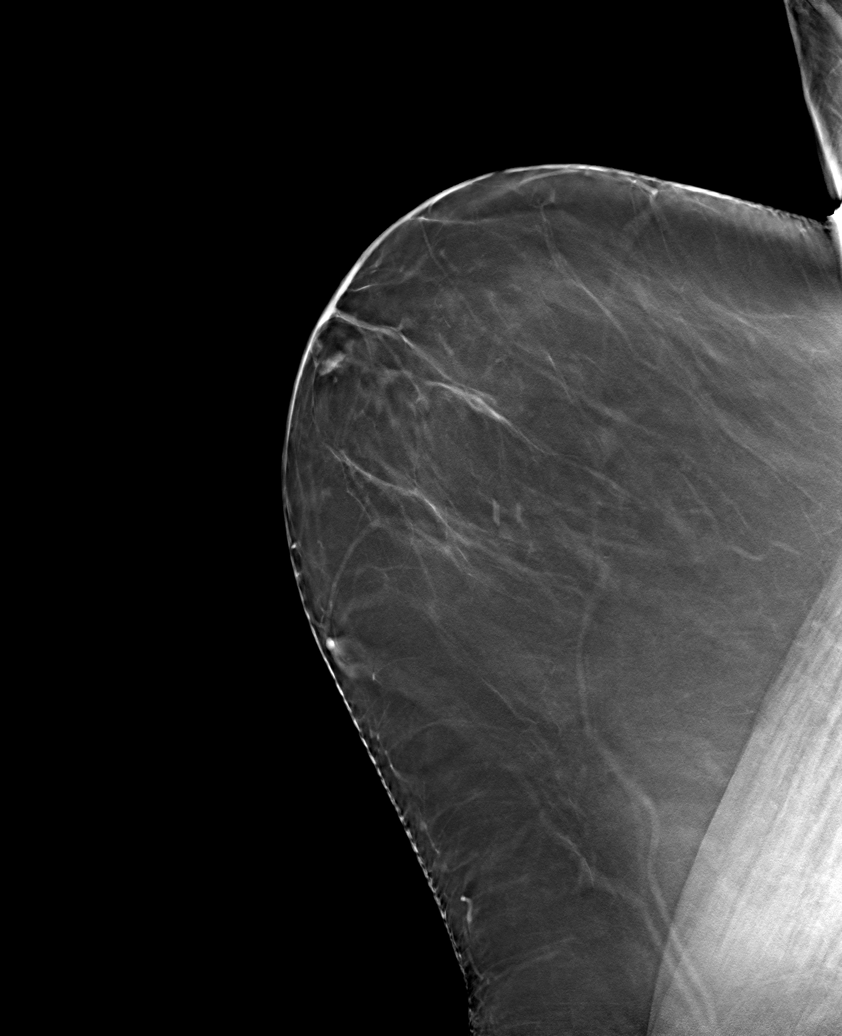

[L CC tomo · tomo slice 35/70.0]
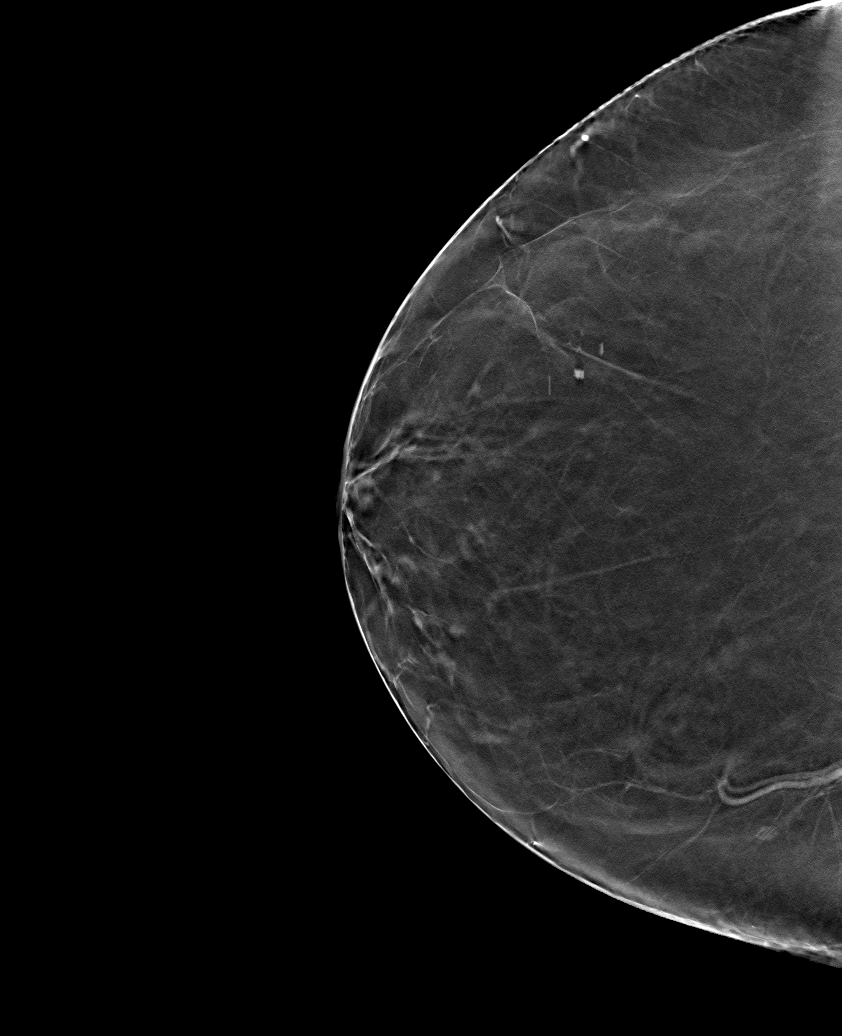

[6 of 14 positions shown; findings below may reference images not displayed]

FINDINGS: The patient has had a right mastectomy. There are no findings
suspicious for malignancy.

Images were processed with CAD.
IMPRESSION: No mammographic evidence of malignancy. A result letter of this
screening mammogram will be mailed directly to the patient.

RECOMMENDATION:
Screening mammogram in one year.  (Code:YA-A-A8A)

BI-RADS CATEGORY  1: Negative.

## 2017-09-06 ENCOUNTER — Telehealth: Payer: Self-pay | Admitting: Family Medicine

## 2017-09-06 ENCOUNTER — Other Ambulatory Visit: Payer: Self-pay

## 2017-09-06 MED ORDER — SERTRALINE HCL 50 MG PO TABS: 50.0000 mg | ORAL_TABLET | Freq: Every day | ORAL | 1 refills | Status: DC

## 2017-09-06 NOTE — Telephone Encounter (Unsigned)
Copied from [place] [id number]. Topic: Quick Communication - See Telephone Encounter >> Sep 06, 2017 10:46 AM Hewitt Shorts wrote: CRM for notification. See Telephone encounter for: 09/06/17.pt is wanting to know if she can get her dosage changed from  30  day supply to a 90 day supply  Best number [phone]

## 2017-09-06 NOTE — Telephone Encounter (Signed)
Call to pharmacy- patient has 90 day and 30 day left with refills before she is due follow up with provider. Left message for patient task completed as she requested.

## 2017-09-18 ENCOUNTER — Ambulatory Visit: Payer: Self-pay | Admitting: *Deleted

## 2017-09-18 NOTE — Telephone Encounter (Signed)
Pt called with dizziness when she is lying down and esp when she turns her head to the left. She still has to hold on to the wall when she is walking. She also is having some congestion and sinus congestion. She has been dx with vertigo about 31 years ago and has only had another episode with it about 10 years ago.  Home care advice given to her with verbal understanding.  Appointment made per protocol. Advised to call back with worsening symptoms and go to the emergency department at closest facility. Pt voiced understanding.  Reason for Disposition . [1] MODERATE dizziness (e.g., vertigo; feels very unsteady, interferes with normal activities) AND [2] has NOT been evaluated by physician for this  Answer Assessment - Initial Assessment Questions 1. DESCRIPTION: "Describe your dizziness."     Head spinning 2. VERTIGO: "Do you feel like either you or the room is spinning or tilting?"      Yes spinning 3. LIGHTHEADED: "Do you feel lightheaded?" (e.g., somewhat faint, woozy, weak upon standing)     woozy 4. SEVERITY: "How bad is it?"  "Can you walk?"   - MILD - Feels unsteady but walking normally.   - MODERATE - Feels very unsteady when walking, but not falling; interferes with normal activities (e.g., school, work) .   - SEVERE - Unable to walk without falling (requires assistance).     moderate 5. ONSET:  "When did the dizziness begin?"     Thursday 6. AGGRAVATING FACTORS: "Does anything make it worse?" (e.g., standing, change in head position)     Changing head position to the left side 7. CAUSE: "What do you think is causing the dizziness?"     vertigo 8. RECURRENT SYMPTOM: "Have you had dizziness before?" If so, ask: "When was the last time?" "What happened that time?"     Once 31 years ago and at least 10 years ago 9. OTHER SYMPTOMS: "Do you have any other symptoms?" (e.g., headache, weakness, numbness, vomiting, earache)     Congestion, sinus congestion, nausea if she keeps her eyes  open 10. PREGNANCY: "Is there any chance you are pregnant?" "When was your last menstrual period?"       no  Protocols used: DIZZINESS - VERTIGO-A-AH

## 2017-09-19 ENCOUNTER — Encounter: Payer: Self-pay | Admitting: Medical

## 2017-09-19 ENCOUNTER — Ambulatory Visit: Payer: BLUE CROSS/BLUE SHIELD | Admitting: Medical

## 2017-09-19 VITALS — BP 110/73 | HR 97 | Temp 99.4°F | Resp 16 | Ht 66.0 in | Wt 292.6 lb

## 2017-09-19 DIAGNOSIS — J301 Allergic rhinitis due to pollen: Secondary | ICD-10-CM

## 2017-09-19 DIAGNOSIS — D649 Anemia, unspecified: Secondary | ICD-10-CM

## 2017-09-19 DIAGNOSIS — R42 Dizziness and giddiness: Secondary | ICD-10-CM

## 2017-09-19 LAB — CBC WITH DIFFERENTIAL/PLATELET
Basophils Absolute: 0 10*3/uL (ref 0.0–0.1)
Basophils Relative: 0.5 % (ref 0.0–3.0)
Eosinophils Absolute: 0.2 10*3/uL (ref 0.0–0.7)
Eosinophils Relative: 1.8 % (ref 0.0–5.0)
HCT: 36.6 % (ref 36.0–46.0)
Hemoglobin: 12.1 g/dL (ref 12.0–15.0)
Lymphocytes Relative: 35.4 % (ref 12.0–46.0)
Lymphs Abs: 3.3 10*3/uL (ref 0.7–4.0)
MCHC: 33 g/dL (ref 30.0–36.0)
MCV: 86.5 fl (ref 78.0–100.0)
Monocytes Absolute: 1 10*3/uL (ref 0.1–1.0)
Monocytes Relative: 10.3 % (ref 3.0–12.0)
Neutro Abs: 4.8 10*3/uL (ref 1.4–7.7)
Neutrophils Relative %: 52 % (ref 43.0–77.0)
Platelets: 260 10*3/uL (ref 150.0–400.0)
RBC: 4.23 Mil/uL (ref 3.87–5.11)
RDW: 15.6 % — ABNORMAL HIGH (ref 11.5–15.5)
WBC: 9.3 10*3/uL (ref 4.0–10.5)

## 2017-09-19 LAB — COMPREHENSIVE METABOLIC PANEL
ALT: 21 U/L (ref 0–35)
AST: 20 U/L (ref 0–37)
Albumin: 4.1 g/dL (ref 3.5–5.2)
Alkaline Phosphatase: 71 U/L (ref 39–117)
BUN: 20 mg/dL (ref 6–23)
CO2: 33 mEq/L — ABNORMAL HIGH (ref 19–32)
Calcium: 9.9 mg/dL (ref 8.4–10.5)
Chloride: 97 mEq/L (ref 96–112)
Creatinine, Ser: 1.01 mg/dL (ref 0.40–1.20)
GFR: 58.81 mL/min — ABNORMAL LOW (ref >60.00)
Glucose, Bld: 115 mg/dL — ABNORMAL HIGH (ref 70–99)
Potassium: 4.3 mEq/L (ref 3.5–5.1)
Sodium: 138 mEq/L (ref 135–145)
Total Bilirubin: 0.3 mg/dL (ref 0.2–1.2)
Total Protein: 7.4 g/dL (ref 6.0–8.3)

## 2017-09-19 MED ORDER — MECLIZINE HCL 12.5 MG PO TABS: 12.5000 mg | ORAL_TABLET | Freq: Three times a day (TID) | ORAL | 0 refills | Status: DC | PRN

## 2017-09-19 MED ORDER — FLUTICASONE PROPIONATE 50 MCG/ACT NA SUSP: 2.0000 | Freq: Every day | NASAL | 1 refills | Status: DC

## 2017-09-19 NOTE — Patient Instructions (Signed)
You do have signs and symptoms of vertigo.  You have a good neurologic exam presently and no report of any gross motor or sensory function deficits.  I am going to print you Epley maneuvers to start and we will see if your vertigo tapers off.  If by Monday vertigo not tapering off then would consider referral to physical therapist.  If you have dizziness that is worsening then can try meclizine.  If dizziness with motor or sensory function deficits then recommend ED evaluation for CT imaging.  Will get CBC today to check your anemia and a metabolic panel to check your electrolytes.  You do have some recent allergy symptoms and advised using Astelin twice daily and Flonase in the morning.  Flonase prescription sent to pharmacy today.  Continue with your Zyrtec.  Follow-up in 7 days or as needed.  How to Perform the Epley Maneuver The Epley maneuver is an exercise that relieves symptoms of vertigo. Vertigo is the feeling that you or your surroundings are moving when they are not. When you feel vertigo, you may feel like the room is spinning and have trouble walking. Dizziness is a little different than vertigo. When you are dizzy, you may feel unsteady or light-headed. You can do this maneuver at home whenever you have symptoms of vertigo. You can do it up to 3 times a day until your symptoms go away. Even though the Epley maneuver may relieve your vertigo for a few weeks, it is possible that your symptoms will return. This maneuver relieves vertigo, but it does not relieve dizziness. What are the risks? If it is done correctly, the Epley maneuver is considered safe. Sometimes it can lead to dizziness or nausea that goes away after a short time. If you develop other symptoms, such as changes in vision, weakness, or numbness, stop doing the maneuver and call your health care provider. How to perform the Epley maneuver 1. Sit on the edge of a bed or table with your back straight and your legs extended  or hanging over the edge of the bed or table. 2. Turn your head halfway toward the affected ear or side. 3. Lie backward quickly with your head turned until you are lying flat on your back. You may want to position a pillow under your shoulders. 4. Hold this position for 30 seconds. You may experience an attack of vertigo. This is normal. 5. Turn your head to the opposite direction until your unaffected ear is facing the floor. 6. Hold this position for 30 seconds. You may experience an attack of vertigo. This is normal. Hold this position until the vertigo stops. 7. Turn your whole body to the same side as your head. Hold for another 30 seconds. 8. Sit back up. You can repeat this exercise up to 3 times a day. Follow these instructions at home:  After doing the Epley maneuver, you can return to your normal activities.  Ask your health care provider if there is anything you should do at home to prevent vertigo. He or she may recommend that you: ? Keep your head raised (elevated) with two or more pillows while you sleep. ? Do not sleep on the side of your affected ear. ? Get up slowly from bed. ? Avoid sudden movements during the day. ? Avoid extreme head movement, like looking up or bending over. Contact a health care provider if:  Your vertigo gets worse.  You have other symptoms, including: ? Nausea. ? Vomiting. ? Headache.  Get help right away if:  You have vision changes.  You have a severe or worsening headache or neck pain.  You cannot stop vomiting.  You have new numbness or weakness in any part of your body. Summary  Vertigo is the feeling that you or your surroundings are moving when they are not.  The Epley maneuver is an exercise that relieves symptoms of vertigo.  If the Epley maneuver is done correctly, it is considered safe. You can do it up to 3 times a day. This information is not intended to replace advice given to you by your health care provider. Make sure  you discuss any questions you have with your health care provider. Document Released: 05/20/2013 Document Revised: 04/04/2016 Document Reviewed: 04/04/2016 Elsevier Interactive Patient Education  2017 [employer].

## 2017-09-19 NOTE — Progress Notes (Signed)
Subjective:    Patient ID: [name], female    DOB: [birth year], 62 y.o.   MRN: [id number]  HPI  Pt in for some dizziness that she notes mostly when she lies flat on her back. Also when she turns her head to left side.   Also when she sits up in bed will feel mildly dizzy briefly.  Symptoms since Thursday.  Pt also notes recent nasal congestion. Some sneezing and itchy eyes. Pt is on zyrtec.   Pt indicates at beginning of May will likely loose insurance.  On review no HA or gross motor/sensory function deficits.   Pt is not diabetic.   Review of Systems  Constitutional: Negative for chills, fatigue and fever.  Respiratory: Negative for cough, chest tightness, shortness of breath and wheezing.   Cardiovascular: Negative for chest pain.  Gastrointestinal: Negative for abdominal pain, diarrhea and rectal pain.  Genitourinary: Negative for flank pain and frequency.  Musculoskeletal: Negative for back pain, myalgias, neck pain and neck stiffness.  Skin: Negative for rash.  Neurological: Positive for dizziness. Negative for syncope, weakness, numbness and headaches.       See hpi.  Hematological: Negative for adenopathy. Does not bruise/bleed easily.  Psychiatric/Behavioral: Negative for behavioral problems and confusion. The patient is not nervous/anxious.     Past Medical History:  Diagnosis Date  . Allergic rhinitis   . Allergy   . Anemia 07/17/2016  . Anxiety   . Asthma    seasonal  . Blood transfusion without reported diagnosis   . Breast cancer ([place])    right  . Bronchitis   . Colon polyps   . Depression   . Diabetes mellitus    borderline but takes metformin  . Edema   . Family history of breast cancer in first degree relative   . GERD (gastroesophageal reflux disease)   . Hyperglycemia 03/05/2017  . Hyperlipidemia 08/21/2016   no meds  . Hyperparathyroidism   . Hypertension    controlled by medications  . Neuromuscular disorder ([facility])    Fibromyalgia   . Obesity   . Osteoarthritis    RA  . PONV (postoperative nausea and vomiting)    occasional  . Sleep apnea    wears cpap  . Viral meningitis   . Vitamin D deficiency      Social History   Socioeconomic History  . Marital status: Widowed    Spouse name: Not on file  . Number of children: 1  . Years of education: Not on file  . Highest education level: Not on file  Occupational History  . Occupation: ACCOUNTING    Employer: [employer]  . Financial resource strain: Not on file  . Food insecurity:    Worry: Not on file    Inability: Not on file  . Transportation needs:    Medical: Not on file    Non-medical: Not on file  Tobacco Use  . Smoking status: Never Smoker  . Smokeless tobacco: Never Used  Substance and Sexual Activity  . Alcohol use: Yes    Comment: occ.  . Drug use: No  . Sexual activity: Not on file  Lifestyle  . Physical activity:    Days per week: Not on file    Minutes per session: Not on file  . Stress: Not on file  Relationships  . Social connections:    Talks on phone: Not on file    Gets together: Not on file  Attends religious service: Not on file    Active member of club or organization: Not on file    Attends meetings of clubs or organizations: Not on file    Relationship status: Not on file  . Intimate partner violence:    Fear of current or ex partner: Not on file    Emotionally abused: Not on file    Physically abused: Not on file    Forced sexual activity: Not on file  Other Topics Concern  . Not on file  Social History Narrative  . Not on file    Past Surgical History:  Procedure Laterality Date  . ABDOMINAL HYSTERECTOMY  1998  . ADENOIDECTOMY    . BREAST EXCISIONAL BIOPSY Left 1993   benign  . BREAST SURGERY     Tran flap due to breast cancer  . CESAREAN SECTION  1988  . COLONOSCOPY  08/14/2011  . HAND SURGERY     dog bite, right hand  . HAND SURGERY     trauma, left hand  . KNEE ARTHROSCOPY       bilateral  . left knee replacement  2010  . MASTECTOMY  01/13/94   Right breast  . parathyroid resection    . PARATHYROIDECTOMY    . POLYPECTOMY    . rectal abscess    . SEPTOPLASTY  1980  . SHOULDER ARTHROSCOPY Right 11/09/2015   Procedure: ARTHROSCOPY SHOULDER-acromioplasty, distal clavicle resection and debridement;  Surgeon: Melrose Nakayama, MD;  Location: [facility];  Service: Orthopedics;  Laterality: Right;  . shoulder arthroscopy     rotator cuff repair  . TOE SURGERY Right    paronychia and adenoma removed  . TONSILLECTOMY    . TOTAL KNEE ARTHROPLASTY  06/18/08   [facility]  . TUMOR REMOVAL  1982   , scalp    Family History  Problem Relation Age of Onset  . Emphysema Father   . Lung cancer Father        lung ca dx 51  . Other Father        prostate issues  . Breast cancer Maternal Aunt        dx late 39s  . Colon cancer Maternal Aunt        dx 56s  . Colon polyps Maternal Aunt   . Prostate cancer Maternal Grandfather 85       d. 85y  . Heart disease Paternal Grandfather   . Heart attack Paternal Grandfather        d. 50y  . Diabetes Paternal Grandfather   . Asthma Daughter        "seasonal"  . Arthritis Maternal Grandmother   . Aneurysm Maternal Grandmother        d. brain aneurysm at 81  . Arthritis Mother   . Colon polyps Mother   . Multiple sclerosis Sister   . Breast cancer Sister 15       L IDC and DCIS; ER/PR+, Her2-  . Fibrocystic breast disease Sister   . Arthritis/Rheumatoid Sister   . Cancer Maternal Uncle        d. mouth cancer at younger age; smoker  . Other Paternal Uncle        muscle issues - couldn't walk or talk; d. 20y  . Cancer Maternal Aunt        lymphoma, dx 31s  . Ovarian cancer Maternal Aunt        dx 74s; d. late 62s  . Lung cancer Maternal Uncle  d. 57y; former smoker  . Throat cancer Cousin        maternal 1st cousin; smoker  . Breast cancer Cousin 39       maternal 1st cousin  . Other Paternal Uncle        prostate  issues  . Breast cancer Cousin        paternal 1st cousin dx late 64s  . Throat cancer Cousin        maternal 1st cousin; used SL tobacco  . Prostate cancer Cousin        maternal 1st cousin dx 53s  . Esophageal cancer Neg Hx   . Rectal cancer Neg Hx   . Stomach cancer Neg Hx     Allergies  Allergen Reactions  . Allopurinol Hives  . Mucinex [Guaifenesin Er] Shortness Of Breath  . Penicillins Hives and Other (See Comments)    Has patient had a PCN reaction causing immediate rash, facial/tongue/throat swelling, SOB or lightheadedness with hypotension: no Has patient had a PCN reaction causing severe rash involving mucus membranes or skin necrosis: no Has patient had a PCN reaction that required hospitalization no Has patient had a PCN reaction occurring within the last 10 years: no If all of the above answers are "NO", then may proceed with Cephalosporin use.   . Prozac [Fluoxetine Hcl] Hives  . Lexapro [Escitalopram Oxalate]     Nausea and hypersalivation.     Current Outpatient Medications on File Prior to Visit  Medication Sig Dispense Refill  . acetic acid-hydrocortisone (VOSOL-HC) OTIC solution Place 3 drops into both ears 3 (three) times daily. 10 mL 0  . albuterol (VENTOLIN HFA) 108 (90 Base) MCG/ACT inhaler Inhale 2 puffs into the lungs every 6 (six) hours as needed for wheezing or shortness of breath. 18 g 1  . ALPRAZolam (XANAX) 0.25 MG tablet Take 1 tablet (0.25 mg total) by mouth 2 (two) times daily as needed for anxiety. 30 tablet 2  . amitriptyline (ELAVIL) 10 MG tablet TAKE 1 TO 3 TABLETS BY MOUTH AT BEDTIME 90 tablet 1  . azelastine (ASTELIN) 0.1 % nasal spray Place 2 sprays into both nostrils at bedtime as needed for rhinitis. Use in each nostril as directed 30 mL 3  . azithromycin (ZITHROMAX Z-PAK) 250 MG tablet 2 tabs a day the first day, then 1 tab a day x 4 days 6 tablet 0  . Calcium Carbonate (CALTRATE 600) 1500 MG TABS Take 1,500 mg by mouth daily.     .  cetirizine (ZYRTEC) 10 MG tablet TAKE ONE TABLET BY MOUTH TWICE DAILY 60 tablet 5  . Cholecalciferol (VITAMIN D PO) Take 1 tablet by mouth daily.    . diclofenac (VOLTAREN) 75 MG EC tablet Take 1 tablet (75 mg total) by mouth 2 (two) times daily. 20 tablet 0  . EPINEPHrine 0.3 mg/0.3 mL IJ SOAJ injection Inject 0.3 mLs (0.3 mg total) into the muscle once as needed (Throat or facial swelling or difficulty breathing). 1 Device 0  . esomeprazole (NEXIUM) 40 MG capsule Take 1 capsule (40 mg total) by mouth daily. 90 capsule 1  . famotidine (PEPCID) 20 MG tablet Take 1 tablet (20 mg total) by mouth daily. 90 tablet 0  . Ferrous Fumarate-Folic Acid (HEMOCYTE-F) 324-1 MG TABS Take 1 tablet by mouth daily. 30 each 5  . fluocinonide (LIDEX) 0.05 % external solution As directed    . fluticasone (FLONASE) 50 MCG/ACT nasal spray Place 2 sprays into both nostrils daily. (Patient taking  differently: Place 2 sprays into both nostrils daily as needed for allergies. ) 16 g 1  . furosemide (LASIX) 40 MG tablet TAKE 1 TABLET BY MOUTH TWICE DAILY 60 tablet 3  . gabapentin (NEURONTIN) 300 MG capsule   3  . Ginger 500 MG CAPS Take 500 mg by mouth daily.     Marland Kitchen HYDROcodone-homatropine (HYCODAN) 5-1.5 MG/5ML syrup Take 5 mLs by mouth at bedtime as needed for cough. 120 mL 0  . KRILL OIL PO Take 1 capsule by mouth daily.    . metFORMIN (GLUCOPHAGE) 500 MG tablet TAKE 1 TABLET BY MOUTH TWICE DAILY WITH MEALS 180 tablet 1  . montelukast (SINGULAIR) 10 MG tablet Take 1 tablet (10 mg total) by mouth at bedtime. 30 tablet 5  . Multiple Vitamins-Minerals (MULTIVITAMIN & MINERAL PO) Take 1 tablet by mouth daily.    Marland Kitchen OVER THE COUNTER MEDICATION Apple cider vinegar tablets 3 once a day    . predniSONE (DELTASONE) 10 MG tablet 2 tabs a day x 5 days 10 tablet 0  . Probiotic Product (PROBIOTIC ADVANCED PO) Take 1 capsule by mouth daily.    . ranitidine (ZANTAC) 150 MG tablet TAKE ONE TABLET BY MOUTH TWICE DAILY 60 tablet 5  .  rOPINIRole (REQUIP) 0.5 MG tablet Take 1 tablet (0.5 mg total) by mouth 3 (three) times daily. 30 tablet 1  . sertraline (ZOLOFT) 50 MG tablet Take 1 tablet (50 mg total) by mouth daily. 90 tablet 1  . telmisartan-hydrochlorothiazide (MICARDIS HCT) 80-25 MG tablet TAKE 1 TABLET BY MOUTH ONCE DAILY 90 tablet 1  . Turmeric 500 MG CAPS Take 500 mg by mouth daily.      No current facility-administered medications on file prior to visit.     BP 110/73   Pulse 97   Temp 99.4 F (37.4 C) (Oral)   Resp 16   Ht '5\' 6"'  (1.676 m)   Wt 292 lb 9.6 oz (132.7 kg)   SpO2 95%   BMI 47.23 kg/m       Objective:   Physical Exam  General Mental Status- Alert. General Appearance- Not in acute distress.   Skin General: Color- Normal Color. Moisture- Normal Moisture.  Neck Carotid Arteries- Normal color. Moisture- Normal Moisture. No carotid bruits. No JVD.  Chest and Lung Exam Auscultation: Breath Sounds:-Normal.  Cardiovascular Auscultation:Rythm- Regular. Murmurs & Other Heart Sounds:Auscultation of the heart reveals- No Murmurs.  Abdomen Inspection:-Inspeection Normal. Palpation/Percussion:Note:No mass. Palpation and Percussion of the abdomen reveal- Non Tender, Non Distended + BS, no rebound or guarding.  Neurologic Cranial Nerve exam:- CN III-XII intact(No nystagmus), symmetric smile. Drift Test:- No drift. Romberg Exam:- Negative.  Heal to Toe Gait exam:-Normal. Finger to Nose:- Normal/Intact Strength:- 5/5 equal and symmetric strength both upper and lower extremities. On supine to sitting brief 10 second or so with dizziness/vertigo. On lying supine  No vertigo but  when turns head left is worse.   HEENT Head- Normal. Ear Auditory Canal - Left- Normal. Right - Normal.Tympanic Membrane- Left- Normal. Right- Normal. Eye Sclera/Conjunctiva- Left- Normal. Right- Normal. Nose & Sinuses Nasal Mucosa- Left-  Boggy and Congested. Right-  Boggy and  Congested.Bilateral  No  maxillary and no frontal sinus pressure. Mouth & Throat Lips: Upper Lip- Normal: no dryness, cracking, pallor, cyanosis, or vesicular eruption. Lower Lip-Normal: no dryness, cracking, pallor, cyanosis or vesicular eruption. Buccal Mucosa- Bilateral- No Aphthous ulcers. Oropharynx- No Discharge or Erythema. +pnd Tonsils: Characteristics- Bilateral- No Erythema or Congestion. Size/Enlargement- Bilateral- No enlargement. Discharge-  bilateral-None.  Neck Neck- Supple. No Masses.   Chest and Lung Exam Auscultation: Breath Sounds:-Clear even and unlabored.  Cardiovascular Auscultation:Rythm- Regular, rate and rhythm. Murmurs & Other Heart Sounds:Ausculatation of the heart reveal- No Murmurs.  Lymphatic Head & Neck General Head & Neck Lymphatics: Bilateral: Description- No Localized lymphadenopathy.      Assessment & Plan:  You do have signs and symptoms of vertigo.  You have a good neurologic exam presently and no report of any gross motor or sensory function deficits.  I am going to print you Epley maneuvers to start and we will see if your vertigo tapers off.  If by Monday vertigo not tapering off then would consider referral to physical therapist.  If you have dizziness that is worsening then can try meclizine.  If dizziness with motor or sensory function deficits then recommend ED evaluation for CT imaging.  Will get CBC today to check your anemia and a metabolic panel to check your electrolytes.  You do have some recent allergy symptoms and advised using Astelin twice daily and Flonase in the morning.  Flonase prescription sent to pharmacy today.  Continue with your Zyrtec.  Follow-up in 7 days or as needed.  Mackie Pai, PA-C

## 2017-09-24 ENCOUNTER — Encounter: Payer: Self-pay | Admitting: Medical

## 2017-10-04 ENCOUNTER — Ambulatory Visit: Payer: BLUE CROSS/BLUE SHIELD | Admitting: Family Medicine

## 2017-10-04 ENCOUNTER — Encounter: Payer: Self-pay | Admitting: Family Medicine

## 2017-10-04 VITALS — BP 110/70 | HR 87 | Temp 99.3°F | Resp 18 | Ht 66.0 in | Wt 292.0 lb

## 2017-10-04 DIAGNOSIS — I10 Essential (primary) hypertension: Secondary | ICD-10-CM | POA: Diagnosis not present

## 2017-10-04 DIAGNOSIS — R05 Cough: Secondary | ICD-10-CM | POA: Diagnosis not present

## 2017-10-04 DIAGNOSIS — H8110 Benign paroxysmal vertigo, unspecified ear: Secondary | ICD-10-CM | POA: Diagnosis not present

## 2017-10-04 DIAGNOSIS — R0982 Postnasal drip: Secondary | ICD-10-CM | POA: Diagnosis not present

## 2017-10-04 DIAGNOSIS — R062 Wheezing: Secondary | ICD-10-CM

## 2017-10-04 DIAGNOSIS — R059 Cough, unspecified: Secondary | ICD-10-CM

## 2017-10-04 DIAGNOSIS — E1140 Type 2 diabetes mellitus with diabetic neuropathy, unspecified: Secondary | ICD-10-CM | POA: Diagnosis not present

## 2017-10-04 MED ORDER — HYDROCODONE-HOMATROPINE 5-1.5 MG/5ML PO SYRP: 5.0000 mL | ORAL_SOLUTION | Freq: Three times a day (TID) | ORAL | 0 refills | Status: AC | PRN

## 2017-10-04 MED ORDER — PREDNISONE 10 MG PO TABS: ORAL_TABLET | ORAL | 0 refills | Status: DC

## 2017-10-04 MED ORDER — IPRATROPIUM BROMIDE 0.03 % NA SOLN: 2.0000 | Freq: Four times a day (QID) | NASAL | 6 refills | Status: DC

## 2017-10-04 MED ORDER — CLARITHROMYCIN 250 MG PO TABS: 250.0000 mg | ORAL_TABLET | Freq: Two times a day (BID) | ORAL | 0 refills | Status: DC

## 2017-10-04 MED ORDER — ALBUTEROL SULFATE HFA 108 (90 BASE) MCG/ACT IN AERS: 2.0000 | INHALATION_SPRAY | Freq: Four times a day (QID) | RESPIRATORY_TRACT | 2 refills | Status: DC | PRN

## 2017-10-04 NOTE — Progress Notes (Signed)
[facility] [place], [place], [place] [address] [phone]  Date:  10/04/2017   Name:  [name]   DOB:  [birth year]   MRN:  [id number]  PCP:  Mosie Lukes, MD    Chief Complaint: URI (since yesterday,no known fever coughing, trouble sleeping, , vertigo, neck pain)   History of Present Illness:  [name] is a 62 y.o. very pleasant female patient who presents with the following:  Primary pt of Dr Charlett Blake with history of RA, OSA, obesity, hyperlipidemia, HTN, DM I have seen this pt in the past, back in 2017 Here today with concern of illness- she has noted upper respiratory sx off an on since January- will wax and wane She was doing ok, but yesterday she noted recurrent onset of productive cough which got worse when she laid down in bed. She was able to bring up a "wad of phlegm."  She tried flonase, and delsym and vicks vapo rub She coughed a lot last night, had a hard time sleeping She has noted some wheezing   She does tend to have seasonal wheezing when she has her allergy sx, but does not have history of asthma   She took a zpack back in February  States that she often uses clarithormycin which will work well for her She needs a new albuterol inhaler as hers is too old to uses   She has vertigo which we have been trying to work on with Epley maneuvers She has had vertigo for 3 weeks now, and her insurnace expires at the end of this month - will put in a neurology appt for her   No history of glaucoma  Lab Results  Component Value Date   HGBA1C 6.4 06/05/2017   She is on metformin 500 once a day She does not have a glucose meter - we can give her one, she does know how to use one   NCCSR:  Cough syrup in February   Patient Active Problem List   Diagnosis Date Noted  . RLS (restless legs syndrome) 06/07/2017  . Right knee pain 12/06/2016  . Hip pain, bilateral 12/03/2016  .  Hyperlipidemia 08/21/2016  . Anemia 07/17/2016  . Genetic testing 05/15/2016  . Allergic urticaria 03/27/2016  . Dermographia 03/27/2016  . History of breast cancer 03/27/2016  . Rheumatoid arthritis ([place]) 06/24/2015  . Diverticulitis of colon without hemorrhage 09/21/2014  . Asthma with acute exacerbation 07/14/2014  . Lumbar radiculopathy, acute 08/21/2013  . Gout of big toe 03/10/2013  . Preventative health care 10/08/2012  . OSA (obstructive sleep apnea) 04/23/2012  . Diabetes mellitus, type 2 ([place]) 12/10/2011  . Personal history of colonic polyps - adenoma 08/14/2011  . Fatigue 08/03/2011  . Ear canal dryness 08/03/2011  . Palpitations 09/21/2010  . Obesity 09/21/2010  . PAC (premature atrial contraction) 09/21/2010  . AIRWAY OBSTRUCTION 06/20/2010  . ATTENTION DEFICIT DISORDER, INATTENTIVE TYPE 09/22/2009  . Acute sinusitis 09/22/2009  . VIRAL MENINGITIS, HX OF 04/29/2009  . Insomnia 02/15/2009  . Vitamin D deficiency 03/11/2008  . Fibromyalgia 03/05/2008  . HYPERPARATHYROIDISM, HX OF 08/06/2007  . ALLERGIC RHINITIS 07/05/2007  . Osteoarthritis 02/28/2007  . Edema 11/01/2006  . Depression with anxiety 06/25/2006  . Essential hypertension 06/25/2006  . Gastroesophageal reflux disease without esophagitis 06/25/2006    Past Medical History:  Diagnosis Date  . Allergic rhinitis   . Allergy   . Anemia 07/17/2016  .  Anxiety   . Asthma    seasonal  . Blood transfusion without reported diagnosis   . Breast cancer ([facility])    right  . Bronchitis   . Colon polyps   . Depression   . Diabetes mellitus    borderline but takes metformin  . Edema   . Family history of breast cancer in first degree relative   . GERD (gastroesophageal reflux disease)   . Hyperglycemia 03/05/2017  . Hyperlipidemia 08/21/2016   no meds  . Hyperparathyroidism   . Hypertension    controlled by medications  . Neuromuscular disorder (HCC)    Fibromyalgia  . Obesity   . Osteoarthritis    RA   . PONV (postoperative nausea and vomiting)    occasional  . Sleep apnea    wears cpap  . Viral meningitis   . Vitamin D deficiency     Past Surgical History:  Procedure Laterality Date  . ABDOMINAL HYSTERECTOMY  1998  . ADENOIDECTOMY    . BREAST EXCISIONAL BIOPSY Left 1993   benign  . BREAST SURGERY     Tran flap due to breast cancer  . CESAREAN SECTION  1988  . COLONOSCOPY  08/14/2011  . HAND SURGERY     dog bite, right hand  . HAND SURGERY     trauma, left hand  . KNEE ARTHROSCOPY     bilateral  . left knee replacement  2010  . MASTECTOMY  01/13/94   Right breast  . parathyroid resection    . PARATHYROIDECTOMY    . POLYPECTOMY    . rectal abscess    . SEPTOPLASTY  1980  . SHOULDER ARTHROSCOPY Right 11/09/2015   Procedure: ARTHROSCOPY SHOULDER-acromioplasty, distal clavicle resection and debridement;  Surgeon: Melrose Nakayama, MD;  Location: [facility];  Service: Orthopedics;  Laterality: Right;  . shoulder arthroscopy     rotator cuff repair  . TOE SURGERY Right    paronychia and adenoma removed  . TONSILLECTOMY    . TOTAL KNEE ARTHROPLASTY  06/18/08   [facility]  . TUMOR REMOVAL  1982   , scalp    Social History   Tobacco Use  . Smoking status: Never Smoker  . Smokeless tobacco: Never Used  Substance Use Topics  . Alcohol use: Yes    Comment: occ.  . Drug use: No    Family History  Problem Relation Age of Onset  . Emphysema Father   . Lung cancer Father        lung ca dx 56  . Other Father        prostate issues  . Breast cancer Maternal Aunt        dx late 67s  . Colon cancer Maternal Aunt        dx 26s  . Colon polyps Maternal Aunt   . Prostate cancer Maternal Grandfather 85       d. 85y  . Heart disease Paternal Grandfather   . Heart attack Paternal Grandfather        d. 50y  . Diabetes Paternal Grandfather   . Asthma Daughter        "seasonal"  . Arthritis Maternal Grandmother   . Aneurysm Maternal Grandmother        d. brain aneurysm at 61   . Arthritis Mother   . Colon polyps Mother   . Multiple sclerosis Sister   . Breast cancer Sister 56       L IDC and DCIS; ER/PR+, Her2-  . Fibrocystic  breast disease Sister   . Arthritis/Rheumatoid Sister   . Cancer Maternal Uncle        d. mouth cancer at younger age; smoker  . Other Paternal Uncle        muscle issues - couldn't walk or talk; d. 20y  . Cancer Maternal Aunt        lymphoma, dx 6s  . Ovarian cancer Maternal Aunt        dx 69s; d. late 47s  . Lung cancer Maternal Uncle        d. 31y; former smoker  . Throat cancer Cousin        maternal 1st cousin; smoker  . Breast cancer Cousin 51       maternal 1st cousin  . Other Paternal Uncle        prostate issues  . Breast cancer Cousin        paternal 1st cousin dx late 71s  . Throat cancer Cousin        maternal 1st cousin; used SL tobacco  . Prostate cancer Cousin        maternal 1st cousin dx 17s  . Esophageal cancer Neg Hx   . Rectal cancer Neg Hx   . Stomach cancer Neg Hx     Allergies  Allergen Reactions  . Allopurinol Hives  . Mucinex [Guaifenesin Er] Shortness Of Breath  . Penicillins Hives and Other (See Comments)    Has patient had a PCN reaction causing immediate rash, facial/tongue/throat swelling, SOB or lightheadedness with hypotension: no Has patient had a PCN reaction causing severe rash involving mucus membranes or skin necrosis: no Has patient had a PCN reaction that required hospitalization no Has patient had a PCN reaction occurring within the last 10 years: no If all of the above answers are "NO", then may proceed with Cephalosporin use.   . Prozac [Fluoxetine Hcl] Hives  . Amitriptyline Other (See Comments)    "felt weird, fatigue, dizziness"  . Lexapro [Escitalopram Oxalate]     Nausea and hypersalivation.     Medication list has been reviewed and updated.  Current Outpatient Medications on File Prior to Visit  Medication Sig Dispense Refill  . acetic acid-hydrocortisone  (VOSOL-HC) OTIC solution Place 3 drops into both ears 3 (three) times daily. 10 mL 0  . albuterol (VENTOLIN HFA) 108 (90 Base) MCG/ACT inhaler Inhale 2 puffs into the lungs every 6 (six) hours as needed for wheezing or shortness of breath. 18 g 1  . ALPRAZolam (XANAX) 0.25 MG tablet Take 1 tablet (0.25 mg total) by mouth 2 (two) times daily as needed for anxiety. 30 tablet 2  . azelastine (ASTELIN) 0.1 % nasal spray Place 2 sprays into both nostrils at bedtime as needed for rhinitis. Use in each nostril as directed 30 mL 3  . azithromycin (ZITHROMAX Z-PAK) 250 MG tablet 2 tabs a day the first day, then 1 tab a day x 4 days 6 tablet 0  . Calcium Carbonate (CALTRATE 600) 1500 MG TABS Take 1,500 mg by mouth daily.     . cetirizine (ZYRTEC) 10 MG tablet TAKE ONE TABLET BY MOUTH TWICE DAILY 60 tablet 5  . Cholecalciferol (VITAMIN D PO) Take 1 tablet by mouth daily.    . diclofenac (VOLTAREN) 75 MG EC tablet Take 1 tablet (75 mg total) by mouth 2 (two) times daily. 20 tablet 0  . EPINEPHrine 0.3 mg/0.3 mL IJ SOAJ injection Inject 0.3 mLs (0.3 mg total) into the muscle once as  needed (Throat or facial swelling or difficulty breathing). 1 Device 0  . esomeprazole (NEXIUM) 40 MG capsule Take 1 capsule (40 mg total) by mouth daily. 90 capsule 1  . famotidine (PEPCID) 20 MG tablet Take 1 tablet (20 mg total) by mouth daily. 90 tablet 0  . Ferrous Fumarate-Folic Acid (HEMOCYTE-F) 324-1 MG TABS Take 1 tablet by mouth daily. 30 each 5  . fluocinonide (LIDEX) 0.05 % external solution As directed    . fluticasone (FLONASE) 50 MCG/ACT nasal spray Place 2 sprays into both nostrils daily. (Patient taking differently: Place 2 sprays into both nostrils daily as needed for allergies. ) 16 g 1  . fluticasone (FLONASE) 50 MCG/ACT nasal spray Place 2 sprays into both nostrils daily. 16 g 1  . furosemide (LASIX) 40 MG tablet TAKE 1 TABLET BY MOUTH TWICE DAILY 60 tablet 3  . gabapentin (NEURONTIN) 300 MG capsule   3  .  Ginger 500 MG CAPS Take 500 mg by mouth daily.     Marland Kitchen HYDROcodone-homatropine (HYCODAN) 5-1.5 MG/5ML syrup Take 5 mLs by mouth at bedtime as needed for cough. 120 mL 0  . KRILL OIL PO Take 1 capsule by mouth daily.    . meclizine (ANTIVERT) 12.5 MG tablet Take 1 tablet (12.5 mg total) by mouth 3 (three) times daily as needed for dizziness. 30 tablet 0  . metFORMIN (GLUCOPHAGE) 500 MG tablet TAKE 1 TABLET BY MOUTH TWICE DAILY WITH MEALS 180 tablet 1  . montelukast (SINGULAIR) 10 MG tablet Take 1 tablet (10 mg total) by mouth at bedtime. 30 tablet 5  . Multiple Vitamins-Minerals (MULTIVITAMIN & MINERAL PO) Take 1 tablet by mouth daily.    Marland Kitchen OVER THE COUNTER MEDICATION Apple cider vinegar tablets 3 once a day    . predniSONE (DELTASONE) 10 MG tablet 2 tabs a day x 5 days 10 tablet 0  . Probiotic Product (PROBIOTIC ADVANCED PO) Take 1 capsule by mouth daily.    . ranitidine (ZANTAC) 150 MG tablet TAKE ONE TABLET BY MOUTH TWICE DAILY 60 tablet 5  . rOPINIRole (REQUIP) 0.5 MG tablet Take 1 tablet (0.5 mg total) by mouth 3 (three) times daily. 30 tablet 1  . sertraline (ZOLOFT) 50 MG tablet Take 1 tablet (50 mg total) by mouth daily. 90 tablet 1  . telmisartan-hydrochlorothiazide (MICARDIS HCT) 80-25 MG tablet TAKE 1 TABLET BY MOUTH ONCE DAILY 90 tablet 1  . Turmeric 500 MG CAPS Take 500 mg by mouth daily.      No current facility-administered medications on file prior to visit.     Review of Systems:  As per HPI- otherwise negative. No significant fever, no chills   Physical Examination: Vitals:   10/04/17 1647  BP: 110/70  Pulse: 87  Resp: 18  Temp: 99.3 F (37.4 C)  SpO2: 98%   Vitals:   10/04/17 1647  Weight: 292 lb (132.5 kg)  Height: 5' 6" (1.676 m)   Body mass index is 47.13 kg/m. Ideal Body Weight: Weight in (lb) to have BMI = 25: 154.6  GEN: WDWN, NAD, Non-toxic, A & O x 3 HEENT: Atraumatic, Normocephalic. Neck supple. No masses, No LAD.  Bilateral TM wnl, oropharynx  normal.  PEERL,EOMI.   Ears and Nose: No external deformity. CV: RRR, No M/G/R. No JVD. No thrill. No extra heart sounds. PULM: CTA B, no wheezes, crackles, rhonchi. No retractions. No resp. distress. No accessory muscle use. ABD: S, NT, ND, +BS. No rebound. No HSM. EXTR: No c/c/e NEURO Normal  gait.  PSYCH: Normally interactive. Conversant. Not depressed or anxious appearing.  Calm demeanor.  Obese, looks well No wheeze on exam today but she is coughing some   Assessment and Plan: Wheezing - Plan: albuterol (VENTOLIN HFA) 108 (90 Base) MCG/ACT inhaler, predniSONE (DELTASONE) 10 MG tablet  Essential hypertension  Type 2 diabetes mellitus with diabetic neuropathy, without long-term current use of insulin (HCC)  Post-nasal drip - Plan: ipratropium (ATROVENT) 0.03 % nasal spray  Benign paroxysmal positional vertigo, unspecified laterality - Plan: Ambulatory referral to Neurology  Cough - Plan: HYDROcodone-homatropine (HYCODAN) 5-1.5 MG/5ML syrup, clarithromycin (BIAXIN) 250 MG tablet  Here today with sx of allergic bronchospasm Treat with albuterol and hycodan prn. Prednisone.  She uses xanax infrequently- reminded not to use with cough syrup Given rx for abx to hold  Good to see you today- I hope that you feel better soon I refilled your albuterol inhaler and cough syrup today Remember the cough syrup will make you feel sleepy- don't use when you need to drive We will use prednisone for your cough and wheezing- this will make your blood sugar go up. Please watch sugars/ carbs and let me know if excessively high (over 300+)  We will have you try atrovent nasal for post- nasal drip. You can use this up to 4x a day I would have you hold off on using antibiotics at this time due to likely viral illness. However I did give you an rx for biaxin to hold and use if you are not feeling better in the next few days  Meds ordered this encounter  Medications  . ipratropium (ATROVENT) 0.03 %  nasal spray    Sig: Place 2 sprays into the nose 4 (four) times daily.    Dispense:  30 mL    Refill:  6  . albuterol (VENTOLIN HFA) 108 (90 Base) MCG/ACT inhaler    Sig: Inhale 2 puffs into the lungs every 6 (six) hours as needed for wheezing or shortness of breath.    Dispense:  18 g    Refill:  2  . predniSONE (DELTASONE) 10 MG tablet    Sig: 2 tabs a day x 5 days    Dispense:  10 tablet    Refill:  0  . HYDROcodone-homatropine (HYCODAN) 5-1.5 MG/5ML syrup    Sig: Take 5 mLs by mouth every 8 (eight) hours as needed for up to 5 days for cough.    Dispense:  75 mL    Refill:  0  . clarithromycin (BIAXIN) 250 MG tablet    Sig: Take 1 tablet (250 mg total) by mouth 2 (two) times daily.    Dispense:  14 tablet    Refill:  0    Signed Lamar Blinks, MD

## 2017-10-04 NOTE — Patient Instructions (Signed)
Good to see you today- I hope that you feel better soon I refilled your albuterol inhaler and cough syrup today Remember the cough syrup will make you feel sleepy- don't use when you need to drive We will use prednisone for your cough and wheezing- this will make your blood sugar go up. Please watch sugars/ carbs and let me know if excessively high (over 300+)  We will have you try atrovent nasal for post- nasal drip. You can use this up to 4x a day I would have you hold off on using antibiotics at this time due to likely viral illness. However I did give you an rx for biaxin to hold and use if you are not feeling better in the next few days

## 2017-10-05 MED FILL — SHINGRIX 50 MCG SUS: 50 | 1 days supply | Qty: 1 | Fill #1

## 2017-10-11 ENCOUNTER — Ambulatory Visit: Payer: BLUE CROSS/BLUE SHIELD | Admitting: Family Medicine

## 2017-10-11 ENCOUNTER — Encounter: Payer: Self-pay | Admitting: Family Medicine

## 2017-10-11 VITALS — BP 108/62 | HR 75 | Temp 98.7°F | Resp 18 | Wt 294.0 lb

## 2017-10-11 DIAGNOSIS — E6609 Other obesity due to excess calories: Secondary | ICD-10-CM | POA: Diagnosis not present

## 2017-10-11 DIAGNOSIS — E785 Hyperlipidemia, unspecified: Secondary | ICD-10-CM

## 2017-10-11 DIAGNOSIS — J45901 Unspecified asthma with (acute) exacerbation: Secondary | ICD-10-CM | POA: Diagnosis not present

## 2017-10-11 DIAGNOSIS — R739 Hyperglycemia, unspecified: Secondary | ICD-10-CM

## 2017-10-11 DIAGNOSIS — I10 Essential (primary) hypertension: Secondary | ICD-10-CM

## 2017-10-11 DIAGNOSIS — E559 Vitamin D deficiency, unspecified: Secondary | ICD-10-CM | POA: Diagnosis not present

## 2017-10-11 DIAGNOSIS — E1140 Type 2 diabetes mellitus with diabetic neuropathy, unspecified: Secondary | ICD-10-CM | POA: Diagnosis not present

## 2017-10-11 MED ORDER — METHYLPREDNISOLONE 4 MG PO TABS: ORAL_TABLET | ORAL | 0 refills | Status: DC

## 2017-10-11 MED ORDER — HYDROCODONE-HOMATROPINE 5-1.5 MG/5ML PO SYRP: 5.0000 mL | ORAL_SOLUTION | Freq: Every evening | ORAL | 0 refills | Status: DC | PRN

## 2017-10-11 MED ORDER — DOXYCYCLINE HYCLATE 100 MG PO TABS
100.0000 mg | ORAL_TABLET | Freq: Two times a day (BID) | ORAL | 0 refills | Status: DC
Start: 2017-10-11 — End: 2018-05-23

## 2017-10-11 MED ORDER — DOXYCYCLINE HYCLATE 100 MG PO TABS: 100.0000 mg | ORAL_TABLET | Freq: Two times a day (BID) | ORAL | 0 refills | Status: DC

## 2017-10-11 NOTE — Patient Instructions (Addendum)
Encouraged increased rest and hydration, add probiotics, zinc such as Coldeze or Xicam. Treat fevers as needed, Vitamin C 500 to 1000 mg, Elderberry liquid, Aged or black garlic daily, [place]cka herbal for respiratory symptoms  Can increase Cetirizine to twice daily as needed. Sinusitis, Adult Sinusitis is soreness and inflammation of your sinuses. Sinuses are hollow spaces in the bones around your face. They are located:  Around your eyes.  In the middle of your forehead.  Behind your nose.  In your cheekbones.  Your sinuses and nasal passages are lined with a stringy fluid (mucus). Mucus normally drains out of your sinuses. When your nasal tissues get inflamed or swollen, the mucus can get trapped or blocked so air cannot flow through your sinuses. This lets bacteria, viruses, and funguses grow, and that leads to infection. Follow these instructions at home: Medicines  Take, use, or apply over-the-counter and prescription medicines only as told by your doctor. These may include nasal sprays.  If you were prescribed an antibiotic medicine, take it as told by your doctor. Do not stop taking the antibiotic even if you start to feel better. Hydrate and Humidify  Drink enough water to keep your pee (urine) clear or pale yellow.  Use a cool mist humidifier to keep the humidity level in your home above 50%.  Breathe in steam for 10-15 minutes, 3-4 times a day or as told by your doctor. You can do this in the bathroom while a hot shower is running.  Try not to spend time in cool or dry air. Rest  Rest as much as possible.  Sleep with your head raised (elevated).  Make sure to get enough sleep each night. General instructions  Put a warm, moist washcloth on your face 3-4 times a day or as told by your doctor. This will help with discomfort.  Wash your hands often with soap and water. If there is no soap and water, use hand sanitizer.  Do not smoke. Avoid being around people who are  smoking (secondhand smoke).  Keep all follow-up visits as told by your doctor. This is important. Contact a doctor if:  You have a fever.  Your symptoms get worse.  Your symptoms do not get better within 10 days. Get help right away if:  You have a very bad headache.  You cannot stop throwing up (vomiting).  You have pain or swelling around your face or eyes.  You have trouble seeing.  You feel confused.  Your neck is stiff.  You have trouble breathing. This information is not intended to replace advice given to you by your health care provider. Make sure you discuss any questions you have with your health care provider. Document Released: 11/01/2007 Document Revised: 01/09/2016 Document Reviewed: 03/10/2015 Elsevier Interactive Patient Education  2018 [employer].  Vertigo Vertigo means that you feel like you are moving when you are not. Vertigo can also make you feel like things around you are moving when they are not. This feeling can come and go at any time. Vertigo often goes away on its own. Follow these instructions at home:  Avoid making fast movements.  Avoid driving.  Avoid using heavy machinery.  Avoid doing any task or activity that might cause danger to you or other people if you would have a vertigo attack while you are doing it.  Sit down right away if you feel dizzy or have trouble with your balance.  Take over-the-counter and prescription medicines only as told by your  doctor.  Follow instructions from your doctor about which positions or movements you should avoid.  Drink enough fluid to keep your pee (urine) clear or pale yellow.  Keep all follow-up visits as told by your doctor. This is important. Contact a doctor if:  Medicine does not help your vertigo.  You have a fever.  Your problems get worse or you have new symptoms.  Your family or friends see changes in your behavior.  You feel sick to your stomach (nauseous) or you throw up  (vomit).  You have a "pins and needles" feeling or you are numb in part of your body. Get help right away if:  You have trouble moving or talking.  You are always dizzy.  You pass out (faint).  You get very bad headaches.  You feel weak or have trouble using your hands, arms, or legs.  You have changes in your hearing.  You have changes in your seeing (vision).  You get a stiff neck.  Bright light starts to bother you. This information is not intended to replace advice given to you by your health care provider. Make sure you discuss any questions you have with your health care provider. Document Released: 02/22/2008 Document Revised: 10/21/2015 Document Reviewed: 09/07/2014 Elsevier Interactive Patient Education  [employer].

## 2017-10-11 NOTE — Progress Notes (Signed)
Subjective:  I acted as a Education administrator for Dr. Charlett Blake. [place], [place]  Patient ID: [name], female    DOB: 28-Aug-1955, 62 y.o.   MRN: [id number]  No chief complaint on file.   HPI  Patient is in today for a follow up for a recent exacerbation of her asthma. She is feeling notably better aftera a course of steroids. No wheezing any longer. No fevers or chills. No recent hospitalization. She is still feeling light headed and woozey at times. No falls or injury. Some head congestion and pressure into ears at times but no pain. No longer taking Elavil. Denies CP/palp/SOB/fevers/GI or GU c/o. Taking meds as prescribed  Patient Care Team: Mosie Lukes, MD as PCP - General (Family Medicine)   Past Medical History:  Diagnosis Date  . Allergic rhinitis   . Allergy   . Anemia 07/17/2016  . Anxiety   . Asthma    seasonal  . Blood transfusion without reported diagnosis   . Breast cancer ([place])    right  . Bronchitis   . Colon polyps   . Depression   . Diabetes mellitus    borderline but takes metformin  . Edema   . Family history of breast cancer in first degree relative   . GERD (gastroesophageal reflux disease)   . Hyperglycemia 03/05/2017  . Hyperlipidemia 08/21/2016   no meds  . Hyperparathyroidism   . Hypertension    controlled by medications  . Neuromuscular disorder (HCC)    Fibromyalgia  . Obesity   . Osteoarthritis    RA  . PONV (postoperative nausea and vomiting)    occasional  . Sleep apnea    wears cpap  . Viral meningitis   . Vitamin D deficiency     Past Surgical History:  Procedure Laterality Date  . ABDOMINAL HYSTERECTOMY  1998  . ADENOIDECTOMY    . BREAST EXCISIONAL BIOPSY Left 1993   benign  . BREAST SURGERY     Tran flap due to breast cancer  . CESAREAN SECTION  1988  . COLONOSCOPY  08/14/2011  . HAND SURGERY     dog bite, right hand  . HAND SURGERY     trauma, left hand  . KNEE ARTHROSCOPY     bilateral  . left knee replacement  2010  .  MASTECTOMY  01/13/94   Right breast  . parathyroid resection    . PARATHYROIDECTOMY    . POLYPECTOMY    . rectal abscess    . SEPTOPLASTY  1980  . SHOULDER ARTHROSCOPY Right 11/09/2015   Procedure: ARTHROSCOPY SHOULDER-acromioplasty, distal clavicle resection and debridement;  Surgeon: Melrose Nakayama, MD;  Location: [facility];  Service: Orthopedics;  Laterality: Right;  . shoulder arthroscopy     rotator cuff repair  . TOE SURGERY Right    paronychia and adenoma removed  . TONSILLECTOMY    . TOTAL KNEE ARTHROPLASTY  06/18/08   [facility]  . TUMOR REMOVAL  1982   , scalp    Family History  Problem Relation Age of Onset  . Emphysema Father   . Lung cancer Father        lung ca dx 57  . Other Father        prostate issues  . Breast cancer Maternal Aunt        dx late 55s  . Colon cancer Maternal Aunt        dx 18s  . Colon polyps Maternal Aunt   . Prostate cancer  Maternal Grandfather 85       d. 85y  . Heart disease Paternal Grandfather   . Heart attack Paternal Grandfather        d. 50y  . Diabetes Paternal Grandfather   . Asthma Daughter        "seasonal"  . Arthritis Maternal Grandmother   . Aneurysm Maternal Grandmother        d. brain aneurysm at 92  . Arthritis Mother   . Colon polyps Mother   . Multiple sclerosis Sister   . Breast cancer Sister 60       L IDC and DCIS; ER/PR+, Her2-  . Fibrocystic breast disease Sister   . Arthritis/Rheumatoid Sister   . Cancer Maternal Uncle        d. mouth cancer at younger age; smoker  . Other Paternal Uncle        muscle issues - couldn't walk or talk; d. 20y  . Cancer Maternal Aunt        lymphoma, dx 31s  . Ovarian cancer Maternal Aunt        dx 51s; d. late 59s  . Lung cancer Maternal Uncle        d. 46y; former smoker  . Throat cancer Cousin        maternal 1st cousin; smoker  . Breast cancer Cousin 51       maternal 1st cousin  . Other Paternal Uncle        prostate issues  . Breast cancer Cousin        paternal  1st cousin dx late 75s  . Throat cancer Cousin        maternal 1st cousin; used SL tobacco  . Prostate cancer Cousin        maternal 1st cousin dx 49s  . Esophageal cancer Neg Hx   . Rectal cancer Neg Hx   . Stomach cancer Neg Hx     Social History   Socioeconomic History  . Marital status: Widowed    Spouse name: Not on file  . Number of children: 1  . Years of education: Not on file  . Highest education level: Not on file  Occupational History  . Occupation: ACCOUNTING    Employer: [employer]  . Financial resource strain: Not on file  . Food insecurity:    Worry: Not on file    Inability: Not on file  . Transportation needs:    Medical: Not on file    Non-medical: Not on file  Tobacco Use  . Smoking status: Never Smoker  . Smokeless tobacco: Never Used  Substance and Sexual Activity  . Alcohol use: Yes    Comment: occ.  . Drug use: No  . Sexual activity: Not on file  Lifestyle  . Physical activity:    Days per week: Not on file    Minutes per session: Not on file  . Stress: Not on file  Relationships  . Social connections:    Talks on phone: Not on file    Gets together: Not on file    Attends religious service: Not on file    Active member of club or organization: Not on file    Attends meetings of clubs or organizations: Not on file    Relationship status: Not on file  . Intimate partner violence:    Fear of current or ex partner: Not on file    Emotionally abused: Not on file  Physically abused: Not on file    Forced sexual activity: Not on file  Other Topics Concern  . Not on file  Social History Narrative  . Not on file    Outpatient Medications Prior to Visit  Medication Sig Dispense Refill  . acetic acid-hydrocortisone (VOSOL-HC) OTIC solution Place 3 drops into both ears 3 (three) times daily. 10 mL 0  . albuterol (VENTOLIN HFA) 108 (90 Base) MCG/ACT inhaler Inhale 2 puffs into the lungs every 6 (six) hours as needed for  wheezing or shortness of breath. 18 g 2  . ALPRAZolam (XANAX) 0.25 MG tablet Take 1 tablet (0.25 mg total) by mouth 2 (two) times daily as needed for anxiety. 30 tablet 2  . azelastine (ASTELIN) 0.1 % nasal spray Place 2 sprays into both nostrils at bedtime as needed for rhinitis. Use in each nostril as directed 30 mL 3  . Calcium Carbonate (CALTRATE 600) 1500 MG TABS Take 1,500 mg by mouth daily.     . cetirizine (ZYRTEC) 10 MG tablet TAKE ONE TABLET BY MOUTH TWICE DAILY 60 tablet 5  . Cholecalciferol (VITAMIN D PO) Take 1 tablet by mouth daily.    . diclofenac (VOLTAREN) 75 MG EC tablet Take 1 tablet (75 mg total) by mouth 2 (two) times daily. 20 tablet 0  . EPINEPHrine 0.3 mg/0.3 mL IJ SOAJ injection Inject 0.3 mLs (0.3 mg total) into the muscle once as needed (Throat or facial swelling or difficulty breathing). 1 Device 0  . esomeprazole (NEXIUM) 40 MG capsule Take 1 capsule (40 mg total) by mouth daily. 90 capsule 1  . famotidine (PEPCID) 20 MG tablet Take 1 tablet (20 mg total) by mouth daily. 90 tablet 0  . Ferrous Fumarate-Folic Acid (HEMOCYTE-F) 324-1 MG TABS Take 1 tablet by mouth daily. 30 each 5  . fluocinonide (LIDEX) 0.05 % external solution As directed    . fluticasone (FLONASE) 50 MCG/ACT nasal spray Place 2 sprays into both nostrils daily. (Patient taking differently: Place 2 sprays into both nostrils daily as needed for allergies. ) 16 g 1  . fluticasone (FLONASE) 50 MCG/ACT nasal spray Place 2 sprays into both nostrils daily. 16 g 1  . furosemide (LASIX) 40 MG tablet TAKE 1 TABLET BY MOUTH TWICE DAILY 60 tablet 3  . gabapentin (NEURONTIN) 300 MG capsule   3  . Ginger 500 MG CAPS Take 500 mg by mouth daily.     Marland Kitchen ipratropium (ATROVENT) 0.03 % nasal spray Place 2 sprays into the nose 4 (four) times daily. 30 mL 6  . KRILL OIL PO Take 1 capsule by mouth daily.    . metFORMIN (GLUCOPHAGE) 500 MG tablet TAKE 1 TABLET BY MOUTH TWICE DAILY WITH MEALS 180 tablet 1  . montelukast  (SINGULAIR) 10 MG tablet Take 1 tablet (10 mg total) by mouth at bedtime. 30 tablet 5  . Multiple Vitamins-Minerals (MULTIVITAMIN & MINERAL PO) Take 1 tablet by mouth daily.    Marland Kitchen OVER THE COUNTER MEDICATION Apple cider vinegar tablets 3 once a day    . predniSONE (DELTASONE) 10 MG tablet 2 tabs a day x 5 days 10 tablet 0  . Probiotic Product (PROBIOTIC ADVANCED PO) Take 1 capsule by mouth daily.    . ranitidine (ZANTAC) 150 MG tablet TAKE ONE TABLET BY MOUTH TWICE DAILY 60 tablet 5  . sertraline (ZOLOFT) 50 MG tablet Take 1 tablet (50 mg total) by mouth daily. 90 tablet 1  . telmisartan-hydrochlorothiazide (MICARDIS HCT) 80-25 MG tablet TAKE 1  TABLET BY MOUTH ONCE DAILY 90 tablet 1  . Turmeric 500 MG CAPS Take 500 mg by mouth daily.     Marland Kitchen azithromycin (ZITHROMAX Z-PAK) 250 MG tablet 2 tabs a day the first day, then 1 tab a day x 4 days 6 tablet 0  . clarithromycin (BIAXIN) 250 MG tablet Take 1 tablet (250 mg total) by mouth 2 (two) times daily. 14 tablet 0  . HYDROcodone-homatropine (HYCODAN) 5-1.5 MG/5ML syrup Take 5 mLs by mouth at bedtime as needed for cough. 120 mL 0  . meclizine (ANTIVERT) 12.5 MG tablet Take 1 tablet (12.5 mg total) by mouth 3 (three) times daily as needed for dizziness. 30 tablet 0  . rOPINIRole (REQUIP) 0.5 MG tablet Take 1 tablet (0.5 mg total) by mouth 3 (three) times daily. 30 tablet 1   No facility-administered medications prior to visit.     Allergies  Allergen Reactions  . Allopurinol Hives  . Mucinex [Guaifenesin Er] Shortness Of Breath  . Penicillins Hives and Other (See Comments)    Has patient had a PCN reaction causing immediate rash, facial/tongue/throat swelling, SOB or lightheadedness with hypotension: no Has patient had a PCN reaction causing severe rash involving mucus membranes or skin necrosis: no Has patient had a PCN reaction that required hospitalization no Has patient had a PCN reaction occurring within the last 10 years: no If all of the  above answers are "NO", then may proceed with Cephalosporin use.   . Prozac [Fluoxetine Hcl] Hives  . Amitriptyline Other (See Comments)    "felt weird, fatigue, dizziness"  . Lexapro [Escitalopram Oxalate]     Nausea and hypersalivation.     Review of Systems  Constitutional: Positive for malaise/fatigue. Negative for fever.  HENT: Positive for congestion.   Eyes: Negative for blurred vision.  Respiratory: Negative for shortness of breath.   Cardiovascular: Negative for chest pain, palpitations and leg swelling.  Gastrointestinal: Negative for abdominal pain, blood in stool and nausea.  Genitourinary: Negative for dysuria and frequency.  Musculoskeletal: Negative for falls.  Skin: Negative for rash.  Neurological: Positive for dizziness and headaches. Negative for loss of consciousness.  Endo/Heme/Allergies: Negative for environmental allergies.  Psychiatric/Behavioral: Negative for depression. The patient is not nervous/anxious.        Objective:    Physical Exam  Constitutional: She is oriented to person, place, and time. No distress.  HENT:  Head: Normocephalic and atraumatic.  Eyes: Conjunctivae are normal.  Neck: Neck supple. No thyromegaly present.  Cardiovascular: Normal rate, regular rhythm and normal heart sounds.  No murmur heard. Pulmonary/Chest: Effort normal and breath sounds normal. She has no wheezes.  Abdominal: She exhibits no distension and no mass.  Musculoskeletal: She exhibits no edema.  Lymphadenopathy:    She has no cervical adenopathy.  Neurological: She is alert and oriented to person, place, and time.  Skin: Skin is warm and dry. No rash noted. She is not diaphoretic.  Psychiatric: Judgment normal.    BP 108/62 (BP Location: Left Arm, Patient Position: Sitting, Cuff Size: Large)   Pulse 75   Temp 98.7 F (37.1 C) (Oral)   Resp 18   Wt 294 lb (133.4 kg)   SpO2 98%   BMI 47.45 kg/m  Wt Readings from Last 3 Encounters:  10/11/17 294 lb  (133.4 kg)  10/04/17 292 lb (132.5 kg)  09/19/17 292 lb 9.6 oz (132.7 kg)   BP Readings from Last 3 Encounters:  10/11/17 108/62  10/04/17 110/70  09/19/17 110/73  Immunization History  Administered Date(s) Administered  . Influenza Split 03/27/2011, 03/29/2012  . Influenza Whole 04/07/2009, 03/28/2010  . Influenza,inj,Quad PF,6+ Mos 02/26/2013, 01/28/2016, 03/05/2017  . Pneumococcal Polysaccharide-23 04/03/2014  . Td 04/29/2009  . Zoster 01/28/2016  . Zoster Recombinat (Shingrix) 08/08/2017, 10/08/2017    Health Maintenance  Topic Date Due  . Hepatitis C Screening  August 20, 1955  . HIV Screening  07/23/1969  . PAP SMEAR  03/31/2017  . FOOT EXAM  07/11/2017  . MAMMOGRAM  09/13/2017  . INFLUENZA VACCINE  12/27/2017  . HEMOGLOBIN A1C  04/14/2018  . OPHTHALMOLOGY EXAM  08/02/2018  . PNEUMOCOCCAL POLYSACCHARIDE VACCINE (2) 04/04/2019  . TETANUS/TDAP  04/30/2019  . COLONOSCOPY  09/06/2021    Lab Results  Component Value Date   WBC 9.3 09/19/2017   HGB 12.1 09/19/2017   HCT 36.6 09/19/2017   PLT 260.0 09/19/2017   GLUCOSE 115 (H) 09/19/2017   CHOL 182 10/12/2017   TRIG 144.0 10/12/2017   HDL 46.40 10/12/2017   LDLDIRECT 110.0 08/21/2016   LDLCALC 107 (H) 10/12/2017   ALT 21 09/19/2017   AST 20 09/19/2017   NA 138 09/19/2017   K 4.3 09/19/2017   CL 97 09/19/2017   CREATININE 1.01 09/19/2017   BUN 20 09/19/2017   CO2 33 (H) 09/19/2017   TSH 0.88 10/12/2017   INR 2.0 (H) 06/21/2008   HGBA1C 6.6 (H) 10/12/2017    Lab Results  Component Value Date   TSH 0.88 10/12/2017   Lab Results  Component Value Date   WBC 9.3 09/19/2017   HGB 12.1 09/19/2017   HCT 36.6 09/19/2017   MCV 86.5 09/19/2017   PLT 260.0 09/19/2017   Lab Results  Component Value Date   NA 138 09/19/2017   K 4.3 09/19/2017   CO2 33 (H) 09/19/2017   GLUCOSE 115 (H) 09/19/2017   BUN 20 09/19/2017   CREATININE 1.01 09/19/2017   BILITOT 0.3 09/19/2017   ALKPHOS 71 09/19/2017   AST 20  09/19/2017   ALT 21 09/19/2017   PROT 7.4 09/19/2017   ALBUMIN 4.1 09/19/2017   CALCIUM 9.9 09/19/2017   ANIONGAP 11 11/09/2015   GFR 58.81 (L) 09/19/2017   Lab Results  Component Value Date   CHOL 182 10/12/2017   Lab Results  Component Value Date   HDL 46.40 10/12/2017   Lab Results  Component Value Date   LDLCALC 107 (H) 10/12/2017   Lab Results  Component Value Date   TRIG 144.0 10/12/2017   Lab Results  Component Value Date   CHOLHDL 4 10/12/2017   Lab Results  Component Value Date   HGBA1C 6.6 (H) 10/12/2017         Assessment & Plan:   Problem List Items Addressed This Visit    Vitamin D deficiency - Primary    Maintain daily supplements.       Essential hypertension   Relevant Orders   TSH (Completed)   Obesity    Encouraged DASH diet, decrease po intake and increase exercise as tolerated. Needs 7-8 hours of sleep nightly. Avoid trans fats, eat small, frequent meals every 4-5 hours with lean proteins, complex carbs and healthy fats. Minimize simple carbs      Diabetes mellitus, type 2 (HCC)    hgba1c acceptable, minimize simple carbs. Increase exercise as tolerated. Continue current meds      Asthma with acute exacerbation    She notes her wheezing is much better since her treatment with steroids and nasal spray. She still  has some light headedness and woozy feeling at times. Encouraged to continue nasal sprays and hydrate well. This should resolve in time.      Relevant Medications   methylPREDNISolone (MEDROL) 4 MG tablet   Hyperlipidemia    Encouraged heart healthy diet, increase exercise, avoid trans fats, consider a krill oil cap daily      Relevant Orders   Lipid panel (Completed)    Other Visit Diagnoses    Hyperglycemia       Relevant Orders   Hemoglobin A1c (Completed)      I have discontinued Karlissa K. Emrich's rOPINIRole, azithromycin, meclizine, and clarithromycin. I am also having her start on methylPREDNISolone.  Additionally, I am having her maintain her calcium carbonate, Multiple Vitamins-Minerals (MULTIVITAMIN & MINERAL PO), Ginger, Turmeric, fluticasone, Cholecalciferol (VITAMIN D PO), EPINEPHrine, montelukast, KRILL OIL PO, Probiotic Product (PROBIOTIC ADVANCED PO), OVER THE COUNTER MEDICATION, diclofenac, acetic acid-hydrocortisone, cetirizine, ranitidine, famotidine, esomeprazole, fluocinonide, telmisartan-hydrochlorothiazide, ALPRAZolam, Ferrous Fumarate-Folic Acid, azelastine, furosemide, metFORMIN, sertraline, gabapentin, fluticasone, ipratropium, albuterol, predniSONE, HYDROcodone-homatropine, and doxycycline.  Meds ordered this encounter  Medications  . HYDROcodone-homatropine (HYCODAN) 5-1.5 MG/5ML syrup    Sig: Take 5 mLs by mouth at bedtime as needed for cough.    Dispense:  120 mL    Refill:  0  . DISCONTD: doxycycline (VIBRA-TABS) 100 MG tablet    Sig: Take 1 tablet (100 mg total) by mouth 2 (two) times daily.    Dispense:  20 tablet    Refill:  0  . doxycycline (VIBRA-TABS) 100 MG tablet    Sig: Take 1 tablet (100 mg total) by mouth 2 (two) times daily.    Dispense:  20 tablet    Refill:  0  . methylPREDNISolone (MEDROL) 4 MG tablet    Sig: 5 tab po qd X 1d then 4 tab po qd X 1d then 3 tab po qd X 1d then 2 tab po qd then 1 tab po qd    Dispense:  15 tablet    Refill:  0    CMA served as Education administrator during this visit. History, Physical and Plan performed by medical provider. Documentation and orders reviewed and attested to.  Penni Homans, MD

## 2017-10-12 ENCOUNTER — Other Ambulatory Visit (INDEPENDENT_AMBULATORY_CARE_PROVIDER_SITE_OTHER): Payer: BLUE CROSS/BLUE SHIELD

## 2017-10-12 DIAGNOSIS — E785 Hyperlipidemia, unspecified: Secondary | ICD-10-CM | POA: Diagnosis not present

## 2017-10-12 LAB — LIPID PANEL
Cholesterol: 182 mg/dL (ref 0–200)
HDL: 46.4 mg/dL (ref >39.00)
LDL Cholesterol: 107 mg/dL — ABNORMAL HIGH (ref 0–99)
NonHDL: 135.87
Total CHOL/HDL Ratio: 4
Triglycerides: 144 mg/dL (ref 0.0–149.0)
VLDL: 28.8 mg/dL (ref 0.0–40.0)

## 2017-10-12 LAB — TSH: TSH: 0.88 u[IU]/mL (ref 0.35–4.50)

## 2017-10-12 LAB — HEMOGLOBIN A1C: Hgb A1c MFr Bld: 6.6 % — ABNORMAL HIGH (ref 4.6–6.5)

## 2017-10-14 NOTE — Assessment & Plan Note (Signed)
Encouraged heart healthy diet, increase exercise, avoid trans fats, consider a krill oil cap daily 

## 2017-10-14 NOTE — Assessment & Plan Note (Signed)
hgba1c acceptable, minimize simple carbs. Increase exercise as tolerated. Continue current meds 

## 2017-10-14 NOTE — Assessment & Plan Note (Signed)
Maintain daily supplements.

## 2017-10-14 NOTE — Assessment & Plan Note (Signed)
She notes her wheezing is much better since her treatment with steroids and nasal spray. She still has some light headedness and woozy feeling at times. Encouraged to continue nasal sprays and hydrate well. This should resolve in time.

## 2017-10-14 NOTE — Assessment & Plan Note (Signed)
Encouraged DASH diet, decrease po intake and increase exercise as tolerated. Needs 7-8 hours of sleep nightly. Avoid trans fats, eat small, frequent meals every 4-5 hours with lean proteins, complex carbs and healthy fats. Minimize simple carbs 

## 2017-10-24 ENCOUNTER — Other Ambulatory Visit: Payer: Self-pay | Admitting: Family Medicine

## 2017-11-22 ENCOUNTER — Encounter: Payer: Self-pay | Admitting: Family Medicine

## 2017-11-24 ENCOUNTER — Other Ambulatory Visit: Payer: Self-pay | Admitting: Family Medicine

## 2017-11-24 DIAGNOSIS — R51 Headache: Principal | ICD-10-CM

## 2017-11-24 DIAGNOSIS — R519 Headache, unspecified: Secondary | ICD-10-CM

## 2017-11-24 MED ORDER — HYDROXYZINE HCL 25 MG PO TABS: 25.0000 mg | ORAL_TABLET | Freq: Three times a day (TID) | ORAL | 0 refills | Status: DC | PRN

## 2017-12-05 ENCOUNTER — Institutional Professional Consult (permissible substitution): Payer: BLUE CROSS/BLUE SHIELD | Admitting: Diagnostic Neuroimaging

## 2018-02-02 ENCOUNTER — Encounter: Payer: Self-pay | Admitting: Family Medicine

## 2018-02-02 DIAGNOSIS — I10 Essential (primary) hypertension: Secondary | ICD-10-CM

## 2018-02-04 ENCOUNTER — Other Ambulatory Visit: Payer: Self-pay | Admitting: Family Medicine

## 2018-02-08 NOTE — Telephone Encounter (Signed)
Spoke with patient she states she will begin taking the new medication as soon as she gets back from vacation.    FYI: Micardis-HCTZ did not make a 20/12.5.  I sent in 20mg  of Micardis and 12.5 HCTZ to her pharmacy. I did make her aware she needed to take both pills together.  They do have a Micardis-HCTZ 40/12.5mg 

## 2018-02-10 NOTE — Telephone Encounter (Signed)
Perfect thanks

## 2018-02-11 ENCOUNTER — Encounter: Payer: Self-pay | Admitting: Family Medicine

## 2018-02-12 ENCOUNTER — Other Ambulatory Visit: Payer: Self-pay | Admitting: Family Medicine

## 2018-02-12 MED ORDER — TELMISARTAN 40 MG PO TABS: 40.0000 mg | ORAL_TABLET | Freq: Every day | ORAL | 3 refills | Status: DC

## 2018-02-12 MED ORDER — HYDROCHLOROTHIAZIDE 12.5 MG PO CAPS: 12.5000 mg | ORAL_CAPSULE | Freq: Every day | ORAL | 2 refills | Status: DC

## 2018-02-13 MED ORDER — TELMISARTAN 20 MG PO TABS: 20.0000 mg | ORAL_TABLET | Freq: Every day | ORAL | 3 refills | Status: DC

## 2018-02-13 MED ORDER — HYDROCHLOROTHIAZIDE 12.5 MG PO CAPS: 12.5000 mg | ORAL_CAPSULE | Freq: Every day | ORAL | 3 refills | Status: DC

## 2018-02-13 NOTE — Telephone Encounter (Signed)
Spoke with patient she stated she had been only taking half of the 80/20 tablet and was having dizziness.    Would the 20/12.5mg  be correct?  Please advise

## 2018-03-14 ENCOUNTER — Encounter: Payer: Self-pay | Admitting: Family Medicine

## 2018-03-14 ENCOUNTER — Ambulatory Visit: Payer: Self-pay | Admitting: Family Medicine

## 2018-03-14 DIAGNOSIS — R42 Dizziness and giddiness: Secondary | ICD-10-CM

## 2018-03-14 DIAGNOSIS — J309 Allergic rhinitis, unspecified: Secondary | ICD-10-CM

## 2018-03-14 DIAGNOSIS — I10 Essential (primary) hypertension: Secondary | ICD-10-CM

## 2018-03-14 HISTORY — DX: Dizziness and giddiness: R42

## 2018-03-14 MED ORDER — FAMOTIDINE 20 MG PO TABS: 40.0000 mg | ORAL_TABLET | Freq: Every day | ORAL | 0 refills | Status: DC

## 2018-03-14 MED ORDER — TIZANIDINE HCL 4 MG PO TABS: 4.0000 mg | ORAL_TABLET | Freq: Every evening | ORAL | 0 refills | Status: DC | PRN

## 2018-03-14 MED FILL — tiZANidine HCL 4 MG TABS: 4 | 30 days supply | Qty: 30 | Fill #0

## 2018-03-14 NOTE — Patient Instructions (Addendum)
Nasal saline twice a day Zyrtec twice a day Flonase once a day  Hypertension Hypertension, commonly called high blood pressure, is when the force of blood pumping through the arteries is too strong. The arteries are the blood vessels that carry blood from the heart throughout the body. Hypertension forces the heart to work harder to pump blood and may cause arteries to become narrow or stiff. Having untreated or uncontrolled hypertension can cause heart attacks, strokes, kidney disease, and other problems. A blood pressure reading consists of a higher number over a lower number. Ideally, your blood pressure should be below 120/80. The first ("top") number is called the systolic pressure. It is a measure of the pressure in your arteries as your heart beats. The second ("bottom") number is called the diastolic pressure. It is a measure of the pressure in your arteries as the heart relaxes. What are the causes? The cause of this condition is not known. What increases the risk? Some risk factors for high blood pressure are under your control. Others are not. Factors you can change  Smoking.  Having type 2 diabetes mellitus, high cholesterol, or both.  Not getting enough exercise or physical activity.  Being overweight.  Having too much fat, sugar, calories, or salt (sodium) in your diet.  Drinking too much alcohol. Factors that are difficult or impossible to change  Having chronic kidney disease.  Having a family history of high blood pressure.  Age. Risk increases with age.  Race. You may be at higher risk if you are African-American.  Gender. Men are at higher risk than women before age 24. After age 59, women are at higher risk than men.  Having obstructive sleep apnea.  Stress. What are the signs or symptoms? Extremely high blood pressure (hypertensive crisis) may cause:  Headache.  Anxiety.  Shortness of breath.  Nosebleed.  Nausea and vomiting.  Severe chest  pain.  Jerky movements you cannot control (seizures).  How is this diagnosed? This condition is diagnosed by measuring your blood pressure while you are seated, with your arm resting on a surface. The cuff of the blood pressure monitor will be placed directly against the skin of your upper arm at the level of your heart. It should be measured at least twice using the same arm. Certain conditions can cause a difference in blood pressure between your right and left arms. Certain factors can cause blood pressure readings to be lower or higher than normal (elevated) for a short period of time:  When your blood pressure is higher when you are in a health care provider's office than when you are at home, this is called white coat hypertension. Most people with this condition do not need medicines.  When your blood pressure is higher at home than when you are in a health care provider's office, this is called masked hypertension. Most people with this condition may need medicines to control blood pressure.  If you have a high blood pressure reading during one visit or you have normal blood pressure with other risk factors:  You may be asked to return on a different day to have your blood pressure checked again.  You may be asked to monitor your blood pressure at home for 1 week or longer.  If you are diagnosed with hypertension, you may have other blood or imaging tests to help your health care provider understand your overall risk for other conditions. How is this treated? This condition is treated by making healthy lifestyle  changes, such as eating healthy foods, exercising more, and reducing your alcohol intake. Your health care provider may prescribe medicine if lifestyle changes are not enough to get your blood pressure under control, and if:  Your systolic blood pressure is above 130.  Your diastolic blood pressure is above 80.  Your personal target blood pressure may vary depending on your  medical conditions, your age, and other factors. Follow these instructions at home: Eating and drinking  Eat a diet that is high in fiber and potassium, and low in sodium, added sugar, and fat. An example eating plan is called the DASH (Dietary Approaches to Stop Hypertension) diet. To eat this way: ? Eat plenty of fresh fruits and vegetables. Try to fill half of your plate at each meal with fruits and vegetables. ? Eat whole grains, such as whole wheat pasta, brown rice, or whole grain bread. Fill about one quarter of your plate with whole grains. ? Eat or drink low-fat dairy products, such as skim milk or low-fat yogurt. ? Avoid fatty cuts of meat, processed or cured meats, and poultry with skin. Fill about one quarter of your plate with lean proteins, such as fish, chicken without skin, beans, eggs, and tofu. ? Avoid premade and processed foods. These tend to be higher in sodium, added sugar, and fat.  Reduce your daily sodium intake. Most people with hypertension should eat less than 1,500 mg of sodium a day.  Limit alcohol intake to no more than 1 drink a day for nonpregnant women and 2 drinks a day for men. One drink equals 12 oz of beer, 5 oz of wine, or 1 oz of hard liquor. Lifestyle  Work with your health care provider to maintain a healthy body weight or to lose weight. Ask what an ideal weight is for you.  Get at least 30 minutes of exercise that causes your heart to beat faster (aerobic exercise) most days of the week. Activities may include walking, swimming, or biking.  Include exercise to strengthen your muscles (resistance exercise), such as pilates or lifting weights, as part of your weekly exercise routine. Try to do these types of exercises for 30 minutes at least 3 days a week.  Do not use any products that contain nicotine or tobacco, such as cigarettes and e-cigarettes. If you need help quitting, ask your health care provider.  Monitor your blood pressure at home as  told by your health care provider.  Keep all follow-up visits as told by your health care provider. This is important. Medicines  Take over-the-counter and prescription medicines only as told by your health care provider. Follow directions carefully. Blood pressure medicines must be taken as prescribed.  Do not skip doses of blood pressure medicine. Doing this puts you at risk for problems and can make the medicine less effective.  Ask your health care provider about side effects or reactions to medicines that you should watch for. Contact a health care provider if:  You think you are having a reaction to a medicine you are taking.  You have headaches that keep coming back (recurring).  You feel dizzy.  You have swelling in your ankles.  You have trouble with your vision. Get help right away if:  You develop a severe headache or confusion.  You have unusual weakness or numbness.  You feel faint.  You have severe pain in your chest or abdomen.  You vomit repeatedly.  You have trouble breathing. Summary  Hypertension is when the force  of blood pumping through your arteries is too strong. If this condition is not controlled, it may put you at risk for serious complications.  Your personal target blood pressure may vary depending on your medical conditions, your age, and other factors. For most people, a normal blood pressure is less than 120/80.  Hypertension is treated with lifestyle changes, medicines, or a combination of both. Lifestyle changes include weight loss, eating a healthy, low-sodium diet, exercising more, and limiting alcohol. This information is not intended to replace advice given to you by your health care provider. Make sure you discuss any questions you have with your health care provider. Document Released: 05/15/2005 Document Revised: 04/12/2016 Document Reviewed: 04/12/2016 Elsevier Interactive Patient Education  [employer].

## 2018-03-14 NOTE — Progress Notes (Signed)
Subjective:    Patient ID: [name], female    DOB: 1955-08-09, 62 y.o.   MRN: [id number]  No chief complaint on file.   HPI Patient is in today for follow up. No recent febrile illness or hospitalizations. she continues to struggle with itching in her ears especially on the left. Also notes dizziness especially when she moves or stands up quickly, turns her head to the left and bends forward. No headaches, tinnitus, or other neurolgic complaints. Well controlled, no changes to meds. Encouraged heart healthy diet such as the DASH diet and exercise as tolerated.   Past Medical History:  Diagnosis Date  . Allergic rhinitis   . Allergy   . Anemia 07/17/2016  . Anxiety   . Asthma    seasonal  . Blood transfusion without reported diagnosis   . Breast cancer ([facility])    right  . Bronchitis   . Colon polyps   . Depression   . Diabetes mellitus    borderline but takes metformin  . Edema   . Family history of breast cancer in first degree relative   . GERD (gastroesophageal reflux disease)   . Hyperglycemia 03/05/2017  . Hyperlipidemia 08/21/2016   no meds  . Hyperparathyroidism   . Hypertension    controlled by medications  . Neuromuscular disorder ([facility])    Fibromyalgia  . Obesity   . Osteoarthritis    RA  . PONV (postoperative nausea and vomiting)    occasional  . Sleep apnea    wears cpap  . Viral meningitis   . Vitamin D deficiency     Past Surgical History:  Procedure Laterality Date  . ABDOMINAL HYSTERECTOMY  1998  . ADENOIDECTOMY    . BREAST EXCISIONAL BIOPSY Left 1993   benign  . BREAST SURGERY     Tran flap due to breast cancer  . CESAREAN SECTION  1988  . COLONOSCOPY  08/14/2011  . HAND SURGERY     dog bite, right hand  . HAND SURGERY     trauma, left hand  . KNEE ARTHROSCOPY     bilateral  . left knee replacement  2010  . MASTECTOMY  01/13/94   Right breast  . parathyroid resection    . PARATHYROIDECTOMY    . POLYPECTOMY    . rectal abscess      . SEPTOPLASTY  1980  . SHOULDER ARTHROSCOPY Right 11/09/2015   Procedure: ARTHROSCOPY SHOULDER-acromioplasty, distal clavicle resection and debridement;  Surgeon: Melrose Nakayama, MD;  Location: [facility];  Service: Orthopedics;  Laterality: Right;  . shoulder arthroscopy     rotator cuff repair  . TOE SURGERY Right    paronychia and adenoma removed  . TONSILLECTOMY    . TOTAL KNEE ARTHROPLASTY  06/18/08   [facility]  . TUMOR REMOVAL  1982   , scalp    Family History  Problem Relation Age of Onset  . Emphysema Father   . Lung cancer Father        lung ca dx 64  . Other Father        prostate issues  . Breast cancer Maternal Aunt        dx late 51s  . Colon cancer Maternal Aunt        dx 38s  . Colon polyps Maternal Aunt   . Prostate cancer Maternal Grandfather 85       d. 85y  . Heart disease Paternal Grandfather   . Heart attack Paternal Grandfather  d. 50y  . Diabetes Paternal Grandfather   . Asthma Daughter        "seasonal"  . Arthritis Maternal Grandmother   . Aneurysm Maternal Grandmother        d. brain aneurysm at 40  . Arthritis Mother   . Colon polyps Mother   . Multiple sclerosis Sister   . Breast cancer Sister 60       L IDC and DCIS; ER/PR+, Her2-  . Fibrocystic breast disease Sister   . Arthritis/Rheumatoid Sister   . Cancer Maternal Uncle        d. mouth cancer at younger age; smoker  . Other Paternal Uncle        muscle issues - couldn't walk or talk; d. 20y  . Cancer Maternal Aunt        lymphoma, dx 45s  . Ovarian cancer Maternal Aunt        dx 44s; d. late 64s  . Lung cancer Maternal Uncle        d. 31y; former smoker  . Throat cancer Cousin        maternal 1st cousin; smoker  . Breast cancer Cousin 51       maternal 1st cousin  . Other Paternal Uncle        prostate issues  . Breast cancer Cousin        paternal 1st cousin dx late 64s  . Throat cancer Cousin        maternal 1st cousin; used SL tobacco  . Prostate cancer Cousin         maternal 1st cousin dx 60s  . Esophageal cancer Neg Hx   . Rectal cancer Neg Hx   . Stomach cancer Neg Hx     Social History   Socioeconomic History  . Marital status: Widowed    Spouse name: Not on file  . Number of children: 1  . Years of education: Not on file  . Highest education level: Not on file  Occupational History  . Occupation: ACCOUNTING    Employer: [employer]  . Financial resource strain: Not on file  . Food insecurity:    Worry: Not on file    Inability: Not on file  . Transportation needs:    Medical: Not on file    Non-medical: Not on file  Tobacco Use  . Smoking status: Never Smoker  . Smokeless tobacco: Never Used  Substance and Sexual Activity  . Alcohol use: Yes    Comment: occ.  . Drug use: No  . Sexual activity: Not on file  Lifestyle  . Physical activity:    Days per week: Not on file    Minutes per session: Not on file  . Stress: Not on file  Relationships  . Social connections:    Talks on phone: Not on file    Gets together: Not on file    Attends religious service: Not on file    Active member of club or organization: Not on file    Attends meetings of clubs or organizations: Not on file    Relationship status: Not on file  . Intimate partner violence:    Fear of current or ex partner: Not on file    Emotionally abused: Not on file    Physically abused: Not on file    Forced sexual activity: Not on file  Other Topics Concern  . Not on file  Social History Narrative  . Not on  file    Outpatient Medications Prior to Visit  Medication Sig Dispense Refill  . albuterol (VENTOLIN HFA) 108 (90 Base) MCG/ACT inhaler Inhale 2 puffs into the lungs every 6 (six) hours as needed for wheezing or shortness of breath. 18 g 2  . ALPRAZolam (XANAX) 0.25 MG tablet Take 1 tablet (0.25 mg total) by mouth 2 (two) times daily as needed for anxiety. 30 tablet 2  . azelastine (ASTELIN) 0.1 % nasal spray Place 2 sprays into both  nostrils at bedtime as needed for rhinitis. Use in each nostril as directed 30 mL 3  . Calcium Carbonate (CALTRATE 600) 1500 MG TABS Take 1,500 mg by mouth daily.     . cetirizine (ZYRTEC) 10 MG tablet TAKE ONE TABLET BY MOUTH TWICE DAILY 60 tablet 5  . Cholecalciferol (VITAMIN D PO) Take 1 tablet by mouth daily.    . diclofenac (VOLTAREN) 75 MG EC tablet Take 1 tablet (75 mg total) by mouth 2 (two) times daily. 20 tablet 0  . doxycycline (VIBRA-TABS) 100 MG tablet Take 1 tablet (100 mg total) by mouth 2 (two) times daily. 20 tablet 0  . EPINEPHrine 0.3 mg/0.3 mL IJ SOAJ injection Inject 0.3 mLs (0.3 mg total) into the muscle once as needed (Throat or facial swelling or difficulty breathing). 1 Device 0  . esomeprazole (NEXIUM) 40 MG capsule TAKE 1 CAPSULE BY MOUTH ONCE DAILY 90 capsule 1  . Ferrous Fumarate-Folic Acid (HEMOCYTE-F) 324-1 MG TABS Take 1 tablet by mouth daily. 30 each 5  . fluocinonide (LIDEX) 0.05 % external solution As directed    . fluticasone (FLONASE) 50 MCG/ACT nasal spray Place 2 sprays into both nostrils daily. 16 g 1  . furosemide (LASIX) 40 MG tablet TAKE 1 TABLET BY MOUTH TWICE DAILY 60 tablet 3  . gabapentin (NEURONTIN) 300 MG capsule   3  . Ginger 500 MG CAPS Take 500 mg by mouth daily.     . hydrochlorothiazide (MICROZIDE) 12.5 MG capsule Take 1 capsule (12.5 mg total) by mouth daily. 30 capsule 3  . HYDROcodone-homatropine (HYCODAN) 5-1.5 MG/5ML syrup Take 5 mLs by mouth at bedtime as needed for cough. 120 mL 0  . hydrOXYzine (ATARAX/VISTARIL) 25 MG tablet Take 1 tablet (25 mg total) by mouth 3 (three) times daily as needed. 30 tablet 0  . ipratropium (ATROVENT) 0.03 % nasal spray Place 2 sprays into the nose 4 (four) times daily. 30 mL 6  . KRILL OIL PO Take 1 capsule by mouth daily.    . metFORMIN (GLUCOPHAGE) 500 MG tablet TAKE 1 TABLET BY MOUTH TWICE DAILY WITH MEALS 180 tablet 1  . montelukast (SINGULAIR) 10 MG tablet Take 1 tablet (10 mg total) by mouth at  bedtime. 30 tablet 5  . Multiple Vitamins-Minerals (MULTIVITAMIN & MINERAL PO) Take 1 tablet by mouth daily.    Marland Kitchen OVER THE COUNTER MEDICATION Apple cider vinegar tablets 3 once a day    . predniSONE (DELTASONE) 10 MG tablet 2 tabs a day x 5 days 10 tablet 0  . Probiotic Product (PROBIOTIC ADVANCED PO) Take 1 capsule by mouth daily.    . sertraline (ZOLOFT) 50 MG tablet Take 1 tablet (50 mg total) by mouth daily. 90 tablet 1  . telmisartan (MICARDIS) 20 MG tablet Take 1 tablet (20 mg total) by mouth daily. 30 tablet 3  . Turmeric 500 MG CAPS Take 500 mg by mouth daily.     Marland Kitchen acetic acid-hydrocortisone (VOSOL-HC) OTIC solution Place 3 drops into both  ears 3 (three) times daily. 10 mL 0  . famotidine (PEPCID) 20 MG tablet Take 1 tablet (20 mg total) by mouth daily. 90 tablet 0  . fluticasone (FLONASE) 50 MCG/ACT nasal spray Place 2 sprays into both nostrils daily. (Patient taking differently: Place 2 sprays into both nostrils daily as needed for allergies. ) 16 g 1  . methylPREDNISolone (MEDROL) 4 MG tablet 5 tab po qd X 1d then 4 tab po qd X 1d then 3 tab po qd X 1d then 2 tab po qd then 1 tab po qd 15 tablet 0  . ranitidine (ZANTAC) 150 MG tablet TAKE ONE TABLET BY MOUTH TWICE DAILY 60 tablet 5   No facility-administered medications prior to visit.     Allergies  Allergen Reactions  . Allopurinol Hives  . Mucinex [Guaifenesin Er] Shortness Of Breath  . Penicillins Hives and Other (See Comments)    Has patient had a PCN reaction causing immediate rash, facial/tongue/throat swelling, SOB or lightheadedness with hypotension: no Has patient had a PCN reaction causing severe rash involving mucus membranes or skin necrosis: no Has patient had a PCN reaction that required hospitalization no Has patient had a PCN reaction occurring within the last 10 years: no If all of the above answers are "NO", then may proceed with Cephalosporin use.   . Prozac [Fluoxetine Hcl] Hives  . Amitriptyline Other  (See Comments)    "felt weird, fatigue, dizziness"  . Lexapro [Escitalopram Oxalate]     Nausea and hypersalivation.     Review of Systems  Constitutional: Negative for fever and malaise/fatigue.  HENT: Negative for congestion.   Eyes: Negative for blurred vision.  Respiratory: Negative for shortness of breath.   Cardiovascular: Negative for chest pain, palpitations and leg swelling.  Gastrointestinal: Negative for abdominal pain, blood in stool and nausea.  Genitourinary: Negative for dysuria and frequency.  Musculoskeletal: Negative for falls.  Skin: Negative for rash.  Neurological: Positive for dizziness. Negative for loss of consciousness and headaches.  Endo/Heme/Allergies: Negative for environmental allergies.  Psychiatric/Behavioral: Negative for depression. The patient is nervous/anxious.        Objective:    Physical Exam  Constitutional: She is oriented to person, place, and time. She appears well-developed and well-nourished. No distress.  HENT:  Head: Normocephalic and atraumatic.  Nose: Nose normal.  Eyes: Right eye exhibits no discharge. Left eye exhibits no discharge.  Nystagmus noted 2 beats with lateral gaze to left.   Neck: Normal range of motion. Neck supple.  Cardiovascular: Normal rate and regular rhythm.  No murmur heard. Pulmonary/Chest: Effort normal and breath sounds normal.  Abdominal: Soft. Bowel sounds are normal. There is no tenderness.  Musculoskeletal: She exhibits no edema.  Neurological: She is alert and oriented to person, place, and time.  Skin: Skin is warm and dry.  Psychiatric: She has a normal mood and affect.  Nursing note and vitals reviewed.   BP 130/80 (BP Location: Left Arm, Patient Position: Sitting, Cuff Size: Normal)   Pulse 86   Temp 99 F (37.2 C) (Oral)   Resp 18   Wt 292 lb 6.4 oz (132.6 kg)   SpO2 99%   BMI 47.19 kg/m  Wt Readings from Last 3 Encounters:  03/14/18 292 lb 6.4 oz (132.6 kg)  10/11/17 294 lb  (133.4 kg)  10/04/17 292 lb (132.5 kg)     Lab Results  Component Value Date   WBC 9.3 09/19/2017   HGB 12.1 09/19/2017   HCT 36.6 09/19/2017  PLT 260.0 09/19/2017   GLUCOSE 115 (H) 09/19/2017   CHOL 182 10/12/2017   TRIG 144.0 10/12/2017   HDL 46.40 10/12/2017   LDLDIRECT 110.0 08/21/2016   LDLCALC 107 (H) 10/12/2017   ALT 21 09/19/2017   AST 20 09/19/2017   NA 138 09/19/2017   K 4.3 09/19/2017   CL 97 09/19/2017   CREATININE 1.01 09/19/2017   BUN 20 09/19/2017   CO2 33 (H) 09/19/2017   TSH 0.88 10/12/2017   INR 2.0 (H) 06/21/2008   HGBA1C 6.6 (H) 10/12/2017    Lab Results  Component Value Date   TSH 0.88 10/12/2017   Lab Results  Component Value Date   WBC 9.3 09/19/2017   HGB 12.1 09/19/2017   HCT 36.6 09/19/2017   MCV 86.5 09/19/2017   PLT 260.0 09/19/2017   Lab Results  Component Value Date   NA 138 09/19/2017   K 4.3 09/19/2017   CO2 33 (H) 09/19/2017   GLUCOSE 115 (H) 09/19/2017   BUN 20 09/19/2017   CREATININE 1.01 09/19/2017   BILITOT 0.3 09/19/2017   ALKPHOS 71 09/19/2017   AST 20 09/19/2017   ALT 21 09/19/2017   PROT 7.4 09/19/2017   ALBUMIN 4.1 09/19/2017   CALCIUM 9.9 09/19/2017   ANIONGAP 11 11/09/2015   GFR 58.81 (L) 09/19/2017   Lab Results  Component Value Date   CHOL 182 10/12/2017   Lab Results  Component Value Date   HDL 46.40 10/12/2017   Lab Results  Component Value Date   LDLCALC 107 (H) 10/12/2017   Lab Results  Component Value Date   TRIG 144.0 10/12/2017   Lab Results  Component Value Date   CHOLHDL 4 10/12/2017   Lab Results  Component Value Date   HGBA1C 6.6 (H) 10/12/2017       Assessment & Plan:   Problem List Items Addressed This Visit    Essential hypertension    Well controlled, no changes to meds. Encouraged heart healthy diet such as the DASH diet and exercise as tolerated.       Allergic rhinitis    Flared with weather change encouraged Zyrtec bid, Flonase and nasal saline.        Vertigo    Sense of spinning with arising, quick position changes and bending forward. Encouraged to hydrate well and seek care if worsens in frequency or intensity         I have discontinued [name]'s acetic acid-hydrocortisone, ranitidine, and methylPREDNISolone. I have also changed her famotidine. Additionally, I am having her start on tiZANidine. Lastly, I am having her maintain her calcium carbonate, Multiple Vitamins-Minerals (MULTIVITAMIN & MINERAL PO), Ginger, Turmeric, Cholecalciferol (VITAMIN D PO), EPINEPHrine, montelukast, KRILL OIL PO, Probiotic Product (PROBIOTIC ADVANCED PO), OVER THE COUNTER MEDICATION, diclofenac, cetirizine, fluocinonide, ALPRAZolam, Ferrous Fumarate-Folic Acid, azelastine, furosemide, metFORMIN, sertraline, gabapentin, fluticasone, ipratropium, albuterol, predniSONE, HYDROcodone-homatropine, doxycycline, esomeprazole, hydrOXYzine, telmisartan, and hydrochlorothiazide.  Meds ordered this encounter  Medications  . famotidine (PEPCID) 20 MG tablet    Sig: Take 2 tablets (40 mg total) by mouth daily.    Dispense:  90 tablet    Refill:  0  . tiZANidine (ZANAFLEX) 4 MG tablet    Sig: Take 1 tablet (4 mg total) by mouth at bedtime as needed for muscle spasms.    Dispense:  30 tablet    Refill:  0     Penni Homans, MD

## 2018-03-14 NOTE — Assessment & Plan Note (Signed)
Well controlled, no changes to meds. Encouraged heart healthy diet such as the DASH diet and exercise as tolerated.  °

## 2018-03-14 NOTE — Assessment & Plan Note (Signed)
Sense of spinning with arising, quick position changes and bending forward. Encouraged to hydrate well and seek care if worsens in frequency or intensity

## 2018-03-14 NOTE — Assessment & Plan Note (Signed)
Flared with weather change encouraged Zyrtec bid, Flonase and nasal saline.

## 2018-04-18 ENCOUNTER — Ambulatory Visit: Payer: Self-pay | Admitting: Family Medicine

## 2018-05-07 ENCOUNTER — Other Ambulatory Visit: Payer: Self-pay | Admitting: Family Medicine

## 2018-05-23 ENCOUNTER — Ambulatory Visit: Payer: Self-pay | Admitting: Family Medicine

## 2018-05-23 ENCOUNTER — Encounter: Payer: Self-pay | Admitting: Family Medicine

## 2018-05-23 VITALS — BP 142/82 | HR 87 | Temp 98.2°F | Resp 18 | Ht 66.0 in | Wt 298.0 lb

## 2018-05-23 DIAGNOSIS — J0100 Acute maxillary sinusitis, unspecified: Secondary | ICD-10-CM

## 2018-05-23 MED ORDER — DOXYCYCLINE HYCLATE 100 MG PO TABS: 100.0000 mg | ORAL_TABLET | Freq: Two times a day (BID) | ORAL | 0 refills | Status: AC

## 2018-05-23 MED ORDER — PREDNISONE 50 MG PO TABS: 50.0000 mg | ORAL_TABLET | Freq: Every day | ORAL | 0 refills | Status: DC

## 2018-05-23 NOTE — Progress Notes (Addendum)
Chief Complaint  Patient presents with  . URI    cough, clogged ears, nasal congestion,pressure, eye swelling, sickness since November, bronchitis, drainage- took prednisone    [name] here for URI complaints.  Duration: 1 month - 04/08/18 started Associated symptoms: sinus congestion, sinus pain, rhinorrhea, ear fullness, upper dental pain, wheezing and shortness of breath Denies: itchy watery eyes, ear pain, ear drainage, sore throat and myalgia Treatment to date: pred 20 mg/d for 5 d, Clindamycin, Nyquil, and a Predpak Sick contacts: No  +hx of asthma.   ROS:  Const: Denies fevers HEENT: As noted in HPI Lungs: + SOB  Past Medical History:  Diagnosis Date  . Allergic rhinitis   . Allergy   . Anemia 07/17/2016  . Anxiety   . Asthma    seasonal  . Blood transfusion without reported diagnosis   . Breast cancer ([place])    right  . Bronchitis   . Colon polyps   . Depression   . Diabetes mellitus    borderline but takes metformin  . Edema   . Family history of breast cancer in first degree relative   . GERD (gastroesophageal reflux disease)   . Hyperglycemia 03/05/2017  . Hyperlipidemia 08/21/2016   no meds  . Hyperparathyroidism   . Hypertension    controlled by medications  . Neuromuscular disorder (HCC)    Fibromyalgia  . Obesity   . Osteoarthritis    RA  . PONV (postoperative nausea and vomiting)    occasional  . Sleep apnea    wears cpap  . Viral meningitis   . Vitamin D deficiency     BP (!) 142/82 (BP Location: Right Arm, Patient Position: Sitting, Cuff Size: Large)   Pulse 87   Temp 98.2 F (36.8 C) (Oral)   Resp 18   Ht 5\' 6"  (1.676 m)   Wt 298 lb (135.2 kg)   SpO2 97%   BMI 48.10 kg/m  General: Awake, alert, appears stated age HEENT: AT, [place], ears patent b/l and TM w b/l serous fluid, no bulging or erythema, nares patent w/o discharge, pharynx pink and without exudates, +ttp over max sinuses, MMM Neck: No masses or asymmetry Heart:  RRR Lungs: CTAB, no accessory muscle use Psych: Age appropriate judgment and insight, normal mood and affect  Acute maxillary sinusitis, recurrence not specified - Plan: doxycycline (VIBRA-TABS) 100 MG tablet, predniSONE (DELTASONE) 50 MG tablet  Orders as above. Pred for ears. Abx for sinusitis. If no improvement, I would want to get CXR and consider referral for allergic rhinitis given duration.  Continue to push fluids, practice good hand hygiene, cover mouth when coughing. We discussed BP. She has been on OTC meds that can raise it. If no improvement after this resolves, she will schedule appt with Dr Charlett Blake.  F/u prn. If starting to experience fevers, shaking, or shortness of breath, seek immediate care. Pt voiced understanding and agreement to the plan.  Powell, DO 05/23/18 4:38 PM

## 2018-05-23 NOTE — Patient Instructions (Signed)
Continue to push fluids, practice good hand hygiene, and cover your mouth if you cough. ? ?If you start having fevers, shaking or shortness of breath, seek immediate care. ? ?OK to take Tylenol 1000 mg (2 extra strength tabs) or 975 mg (3 regular strength tabs) every 6 hours as needed. ? ?Let us know if you need anything. ?

## 2018-05-28 ENCOUNTER — Telehealth: Payer: Self-pay

## 2018-05-28 NOTE — Telephone Encounter (Signed)
Copied from [place] [id number]. Topic: General - Other >> May 27, 2018  8:57 AM Leward Quan A wrote: Reason for CRM: Patient called to request a letter of sorts that has an explanation of her visit should include, date seen, time spent for visit, person seen, whatever was done and amount paid. Stated that the last one was emailed to her on 03/20/18 any questions please call Ph# [phone]     We have no way of emailing a letter to a patient unless it was via my chart. I do not see where we have sent such letter.  I will contact patient to get more understanding.

## 2018-05-31 ENCOUNTER — Encounter: Payer: Self-pay | Admitting: Family Medicine

## 2018-05-31 ENCOUNTER — Other Ambulatory Visit: Payer: Self-pay | Admitting: Family Medicine

## 2018-05-31 MED ORDER — PSEUDOEPHEDRINE HCL 30 MG PO TABS: 30.0000 mg | ORAL_TABLET | ORAL | 0 refills | Status: DC | PRN

## 2018-06-10 ENCOUNTER — Encounter: Payer: Self-pay | Admitting: Family Medicine

## 2018-06-10 NOTE — Telephone Encounter (Signed)
Patient called wanting a hard copy of a receipt of her last office visit. Patient said that Ms.  Leonia Corona made one for her last time and would like to talk to her about this. Please call pt back, thanks.

## 2018-06-11 ENCOUNTER — Encounter: Payer: Self-pay | Admitting: Family Medicine

## 2018-06-13 NOTE — Telephone Encounter (Signed)
Tried to call pt 2x on mobile number and VM never picked up.  Pt needs to contact billing for this information.

## 2018-06-18 ENCOUNTER — Other Ambulatory Visit: Payer: Self-pay | Admitting: Family Medicine

## 2018-07-05 ENCOUNTER — Other Ambulatory Visit (HOSPITAL_COMMUNITY): Payer: Self-pay | Admitting: *Deleted

## 2018-07-05 DIAGNOSIS — N6320 Unspecified lump in the left breast, unspecified quadrant: Secondary | ICD-10-CM

## 2018-07-16 ENCOUNTER — Encounter (HOSPITAL_COMMUNITY): Payer: Self-pay

## 2018-07-16 ENCOUNTER — Ambulatory Visit (HOSPITAL_COMMUNITY)
Admission: RE | Admit: 2018-07-16 | Discharge: 2018-07-16 | Disposition: A | Discharge status: Home / Self Care | Payer: Self-pay | Source: Ambulatory Visit | Attending: Obstetrics and Gynecology | Admitting: Obstetrics and Gynecology

## 2018-07-16 ENCOUNTER — Ambulatory Visit
Admission: RE | Admit: 2018-07-16 | Discharge: 2018-07-16 | Disposition: A | Discharge status: Home / Self Care | Payer: No Typology Code available for payment source | Source: Ambulatory Visit | Attending: Obstetrics and Gynecology | Admitting: Obstetrics and Gynecology

## 2018-07-16 VITALS — BP 132/76 | Wt 298.0 lb

## 2018-07-16 DIAGNOSIS — N6320 Unspecified lump in the left breast, unspecified quadrant: Secondary | ICD-10-CM

## 2018-07-16 DIAGNOSIS — Z1239 Encounter for other screening for malignant neoplasm of breast: Secondary | ICD-10-CM

## 2018-07-16 DIAGNOSIS — N6324 Unspecified lump in the left breast, lower inner quadrant: Secondary | ICD-10-CM

## 2018-07-16 NOTE — Progress Notes (Signed)
Complaints of a left breast lump x 7 months. Patient has a history of right breast cancer.  Pap Smear: Pap smear not completed today. Last Pap smear was 03/31/2014 at [facility] and normal. Per patient has no history of an abnormal Pap smear. Patient has a history of a hysterectomy in 1998 for fibroids. Patient doesn't need any further Pap smears due to her history of a hysterectomy for benign reasons per BCCCP and ACOG guidelines. Last Pap smear result is in Epic.  Physical exam: Breasts Left breast larger than right breast due to history of mastectomy for breast cancer. No skin abnormalities left breast. Scar observed right breast at mastectomy site.  No nipple retraction left breast. No nipple discharge left breast. No lymphadenopathy. No lumps palpated right breast. Palpated a lump within the left breast at 7 o'clock 7 cm from the nipple. No complaints of pain or tenderness on exam. Referred patient to the [facility] for a diagnostic mammogram and left breast ultrasound. Appointment scheduled for Tuesday, July 16, 2018 at 0920.        Pelvic/Bimanual No Pap smear completed today since patient has a history of a hysterectomy for benign reasons. Pap smear not indicated per BCCCP guidelines.   Smoking History: Patient has never smoked.  Patient Navigation: Patient education provided. Access to services provided for patient through [facility] program.   Colorectal Cancer Screening: Patient had a colonoscopy completed 09/06/2016. No complaints today. FIT Test given to patient to complete and return to BCCCP.  Breast and Cervical Cancer Risk Assessment: Patient has a family history of two sisters and a maternal aunt having breast cancer. Patient completed genetic testing and has known genetic mutations. Patient has no history of radiation treatment to the chest before age 67. Patient has no history of cervical dysplasia, immunocompromised, or DES exposure  in-utero.  Risk Assessment    Risk Scores      07/16/2018   Last edited by: Armond Hang, LPN   5-year risk: 7.6 %   Lifetime risk: 28.5 %

## 2018-07-16 NOTE — Patient Instructions (Signed)
Explained breast self awareness with [name]. Patient did not need a Pap smear today due to patient has a history of a hysterectomy for benign reasons. Explained to patient that she doesn't need any further Pap smears due to her history of a hysterectomy for benign reasons. Referred patient to the [facility] for a diagnostic mammogram and left breast ultrasound. Appointment scheduled for Tuesday, July 16, 2018 at 0920. Patient aware of appointment and will be there. [name] verbalized understanding.  [name], Arvil Chaco, RN 7:42 AM

## 2018-07-17 ENCOUNTER — Encounter (HOSPITAL_COMMUNITY): Payer: Self-pay | Admitting: *Deleted

## 2018-09-30 ENCOUNTER — Encounter: Payer: Self-pay | Admitting: Family Medicine

## 2018-10-01 ENCOUNTER — Other Ambulatory Visit: Payer: Self-pay | Admitting: Family Medicine

## 2018-10-01 MED ORDER — ALPRAZOLAM 0.25 MG PO TABS: 0.2500 mg | ORAL_TABLET | Freq: Two times a day (BID) | ORAL | 0 refills | Status: DC | PRN

## 2018-10-01 MED ORDER — SERTRALINE HCL 50 MG PO TABS: 50.0000 mg | ORAL_TABLET | Freq: Every day | ORAL | 1 refills | Status: DC

## 2018-12-09 ENCOUNTER — Encounter: Payer: Self-pay | Admitting: Family Medicine

## 2018-12-09 ENCOUNTER — Other Ambulatory Visit: Payer: Self-pay | Admitting: Family Medicine

## 2018-12-09 MED ORDER — CIPROFLOXACIN HCL 500 MG PO TABS: 500.0000 mg | ORAL_TABLET | Freq: Two times a day (BID) | ORAL | 0 refills | Status: DC

## 2018-12-09 MED ORDER — METRONIDAZOLE 500 MG PO TABS: 500.0000 mg | ORAL_TABLET | Freq: Three times a day (TID) | ORAL | 0 refills | Status: DC

## 2018-12-11 ENCOUNTER — Ambulatory Visit (INDEPENDENT_AMBULATORY_CARE_PROVIDER_SITE_OTHER): Payer: Self-pay | Admitting: Family Medicine

## 2018-12-11 ENCOUNTER — Other Ambulatory Visit: Payer: Self-pay

## 2018-12-11 ENCOUNTER — Other Ambulatory Visit: Payer: Self-pay | Admitting: Family Medicine

## 2018-12-11 DIAGNOSIS — R197 Diarrhea, unspecified: Secondary | ICD-10-CM

## 2018-12-11 DIAGNOSIS — K219 Gastro-esophageal reflux disease without esophagitis: Secondary | ICD-10-CM

## 2018-12-11 DIAGNOSIS — E1140 Type 2 diabetes mellitus with diabetic neuropathy, unspecified: Secondary | ICD-10-CM

## 2018-12-11 DIAGNOSIS — Z20822 Contact with and (suspected) exposure to covid-19: Secondary | ICD-10-CM

## 2018-12-11 MED ORDER — FAMOTIDINE 40 MG PO TABS: 40.0000 mg | ORAL_TABLET | Freq: Every day | ORAL | 5 refills | Status: DC

## 2018-12-11 NOTE — Assessment & Plan Note (Signed)
She switched to Nexium a couple weeks ago from Ranitidine. We will try to switch to Famotidine 40 mg daily to see if that is effective.

## 2018-12-11 NOTE — Assessment & Plan Note (Signed)
Sick roughly 5 days. She traveled to [place] to spend time with family from [place]. They ate in on Thursday night and had corn on the cob. That night she developed watery, painful diarrhea and she was concerned she had developed diverticulitis which she has had in the past. She says she started feeling better with the initiation of Cipro and flagyl. She never developed fevers or chills. No one around her has had similar symptoms. 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 No known Covid contacts but she agrees to [employer] testing which is ordered. She will self quarantine until results are available.

## 2018-12-11 NOTE — Progress Notes (Signed)
Virtual Visit via Video Note  I connected with [name] on 12/11/18 at 11:20 AM EDT by a video enabled telemedicine application and verified that I am speaking with the correct person using two identifiers.  Location: Patient: work Provider: home   I discussed the limitations of evaluation and management by telemedicine and the availability of in person appointments. The patient expressed understanding and agreed to proceed. [name] CMA was able to get patient set up on video visit.    Subjective:    Patient ID: [name], female    DOB: 09/17/1955, 63 y.o.   MRN: [id number]  No chief complaint on file.   HPI Patient is in today for evaluation of diarrhea. Sick roughly 5 days. She traveled to [place] to spend time with family from [place]. They ate in on Thursday night and had corn on the cob. That night she developed watery, painful diarrhea and she was concerned she had developed diverticulitis which she has had in the past. She says she started feeling better with the initiation of Cipro and flagyl. She never developed fevers or chills. No one around her has had similar symptoms. 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 No known Covid contacts. Her Blood sugars have been good, does not endorse polyuria or polydipsia. Denies CP/palp/SOB/HA/congestion/fevers or GU c/o. Taking meds as prescribed  Past Medical History:  Diagnosis Date  . Allergic rhinitis   . Allergy   . Anemia 07/17/2016  . Anxiety   . Asthma    seasonal  . Blood transfusion without reported diagnosis   . Breast cancer ([place])    right  . Bronchitis   . Colon polyps   . Depression   . Diabetes mellitus    borderline but takes metformin  . Edema   . Family history of breast cancer in first degree relative   . GERD (gastroesophageal reflux disease)   . Hyperglycemia 03/05/2017  . Hyperlipidemia 08/21/2016   no meds  . Hyperparathyroidism   . Hypertension    controlled by medications  . Neuromuscular disorder (HCC)    Fibromyalgia  . Obesity   . Osteoarthritis    RA  . PONV (postoperative nausea and vomiting)    occasional  . Sleep apnea    wears cpap  . Viral meningitis   . Vitamin D deficiency     Past Surgical History:  Procedure Laterality Date  . ABDOMINAL HYSTERECTOMY  1998  . ADENOIDECTOMY    . BREAST EXCISIONAL BIOPSY Left 1993   benign  . BREAST SURGERY     Tran flap due to breast cancer  . CESAREAN SECTION  1988  . COLONOSCOPY  08/14/2011  . HAND SURGERY     dog bite, right hand  . HAND SURGERY     trauma, left hand  . KNEE ARTHROSCOPY     bilateral  . left knee replacement  2010  . MASTECTOMY  01/13/94   Right breast  . parathyroid resection    . PARATHYROIDECTOMY    . POLYPECTOMY    . rectal abscess    . SEPTOPLASTY  1980  . SHOULDER ARTHROSCOPY Right 11/09/2015   Procedure: ARTHROSCOPY SHOULDER-acromioplasty, distal clavicle resection and debridement;  Surgeon: Melrose Nakayama, MD;  Location: [facility];  Service: Orthopedics;  Laterality: Right;  . shoulder arthroscopy     rotator cuff repair  . TOE SURGERY Right    paronychia and adenoma removed  .  TONSILLECTOMY    . TOTAL KNEE ARTHROPLASTY  06/18/08   [facility]  . TUMOR REMOVAL  1982   , scalp    Family History  Problem Relation Age of Onset  . Emphysema Father   . Lung cancer Father        lung ca dx 2  . Other Father        prostate issues  . Breast cancer Maternal Aunt        dx late 78s  . Colon cancer Maternal Aunt        dx 4s  . Colon polyps Maternal Aunt   . Prostate cancer Maternal Grandfather 85       d. 85y  . Heart disease Paternal Grandfather   . Heart attack Paternal Grandfather        d. 50y  . Diabetes Paternal Grandfather   . Asthma Daughter        "seasonal"  . Arthritis Maternal Grandmother   . Aneurysm Maternal Grandmother        d. brain aneurysm at 25  . Arthritis Mother   . Colon polyps Mother   . Multiple sclerosis  Sister   . Breast cancer Sister 50       L IDC and DCIS; ER/PR+, Her2-  . Fibrocystic breast disease Sister   . Arthritis/Rheumatoid Sister   . Breast cancer Sister   . Cancer Maternal Uncle        d. mouth cancer at younger age; smoker  . Other Paternal Uncle        muscle issues - couldn't walk or talk; d. 20y  . Cancer Maternal Aunt        lymphoma, dx 37s  . Ovarian cancer Maternal Aunt        dx 93s; d. late 4s  . Lung cancer Maternal Uncle        d. 86y; former smoker  . Throat cancer Cousin        maternal 1st cousin; smoker  . Breast cancer Cousin 4       maternal 1st cousin  . Other Paternal Uncle        prostate issues  . Breast cancer Cousin        paternal 1st cousin dx late 20s  . Throat cancer Cousin        maternal 1st cousin; used SL tobacco  . Prostate cancer Cousin        maternal 1st cousin dx 29s  . Esophageal cancer Neg Hx   . Rectal cancer Neg Hx   . Stomach cancer Neg Hx     Social History   Socioeconomic History  . Marital status: Widowed    Spouse name: Not on file  . Number of children: 1  . Years of education: Not on file  . Highest education level: 12th grade  Occupational History  . Occupation: ACCOUNTING    Employer: [employer]  . Financial resource strain: Not on file  . Food insecurity    Worry: Not on file    Inability: Not on file  . Transportation needs    Medical: No    Non-medical: No  Tobacco Use  . Smoking status: Never Smoker  . Smokeless tobacco: Never Used  Substance and Sexual Activity  . Alcohol use: Yes    Comment: occ.  . Drug use: No  . Sexual activity: Not on file  Lifestyle  . Physical activity    Days per  week: Not on file    Minutes per session: Not on file  . Stress: Not on file  Relationships  . Social Herbalist on phone: Not on file    Gets together: Not on file    Attends religious service: Not on file    Active member of club or organization: Not on file     Attends meetings of clubs or organizations: Not on file    Relationship status: Not on file  . Intimate partner violence    Fear of current or ex partner: Not on file    Emotionally abused: Not on file    Physically abused: Not on file    Forced sexual activity: Not on file  Other Topics Concern  . Not on file  Social History Narrative  . Not on file    Outpatient Medications Prior to Visit  Medication Sig Dispense Refill  . albuterol (VENTOLIN HFA) 108 (90 Base) MCG/ACT inhaler Inhale 2 puffs into the lungs every 6 (six) hours as needed for wheezing or shortness of breath. 18 g 2  . azelastine (ASTELIN) 0.1 % nasal spray Place 2 sprays into both nostrils at bedtime as needed for rhinitis. Use in each nostril as directed (Patient not taking: Reported on 07/16/2018) 30 mL 3  . Calcium Carbonate (CALTRATE 600) 1500 MG TABS Take 1,500 mg by mouth daily.     . cetirizine (ZYRTEC) 10 MG tablet TAKE ONE TABLET BY MOUTH TWICE DAILY 60 tablet 5  . Cholecalciferol (VITAMIN D PO) Take 1 tablet by mouth daily.    . ciprofloxacin (CIPRO) 500 MG tablet Take 1 tablet (500 mg total) by mouth 2 (two) times daily. 20 tablet 0  . diclofenac (VOLTAREN) 75 MG EC tablet Take 1 tablet (75 mg total) by mouth 2 (two) times daily. (Patient not taking: Reported on 07/16/2018) 20 tablet 0  . EPINEPHrine 0.3 mg/0.3 mL IJ SOAJ injection Inject 0.3 mLs (0.3 mg total) into the muscle once as needed (Throat or facial swelling or difficulty breathing). 1 Device 0  . esomeprazole (NEXIUM) 40 MG capsule TAKE 1 CAPSULE BY MOUTH ONCE DAILY 90 capsule 1  . Ferrous Fumarate-Folic Acid (HEMOCYTE-F) 324-1 MG TABS Take 1 tablet by mouth daily. (Patient not taking: Reported on 07/16/2018) 30 each 5  . fluocinonide (LIDEX) 0.05 % external solution As directed    . fluticasone (FLONASE) 50 MCG/ACT nasal spray Place 2 sprays into both nostrils daily. 16 g 1  . furosemide (LASIX) 40 MG tablet TAKE 1 TABLET BY MOUTH TWICE DAILY 60 tablet 3   . gabapentin (NEURONTIN) 300 MG capsule   3  . Ginger 500 MG CAPS Take 500 mg by mouth daily.     . hydrochlorothiazide (MICROZIDE) 12.5 MG capsule Take 1 capsule by mouth once daily 90 capsule 0  . hydrOXYzine (ATARAX/VISTARIL) 25 MG tablet Take 1 tablet (25 mg total) by mouth 3 (three) times daily as needed. (Patient not taking: Reported on 07/16/2018) 30 tablet 0  . ipratropium (ATROVENT) 0.03 % nasal spray Place 2 sprays into the nose 4 (four) times daily. (Patient not taking: Reported on 07/16/2018) 30 mL 6  . KRILL OIL PO Take 1 capsule by mouth daily.    . metFORMIN (GLUCOPHAGE) 500 MG tablet TAKE 1 TABLET BY MOUTH TWICE DAILY WITH MEALS 180 tablet 1  . metroNIDAZOLE (FLAGYL) 500 MG tablet Take 1 tablet (500 mg total) by mouth 3 (three) times daily. 30 tablet 0  . montelukast (SINGULAIR)  10 MG tablet Take 1 tablet (10 mg total) by mouth at bedtime. (Patient not taking: Reported on 07/16/2018) 30 tablet 5  . Multiple Vitamins-Minerals (MULTIVITAMIN & MINERAL PO) Take 1 tablet by mouth daily.    Marland Kitchen OVER THE COUNTER MEDICATION Apple cider vinegar tablets 3 once a day    . Probiotic Product (PROBIOTIC ADVANCED PO) Take 1 capsule by mouth daily.    . pseudoephedrine (SUDAFED) 30 MG tablet Take 1 tablet (30 mg total) by mouth every 4 (four) hours as needed for congestion. (Patient not taking: Reported on 07/16/2018) 30 tablet 0  . sertraline (ZOLOFT) 50 MG tablet Take 1 tablet (50 mg total) by mouth daily. 90 tablet 1  . telmisartan (MICARDIS) 20 MG tablet Take 1 tablet by mouth once daily 90 tablet 0  . tiZANidine (ZANAFLEX) 4 MG tablet Take 1 tablet (4 mg total) by mouth at bedtime as needed for muscle spasms. (Patient not taking: Reported on 07/16/2018) 30 tablet 0  . Turmeric 500 MG CAPS Take 500 mg by mouth daily.     Marland Kitchen ALPRAZolam (XANAX) 0.25 MG tablet Take 1 tablet (0.25 mg total) by mouth 2 (two) times daily as needed for anxiety. 30 tablet 0  . famotidine (PEPCID) 20 MG tablet Take 2 tablets  (40 mg total) by mouth daily. 90 tablet 0  . predniSONE (DELTASONE) 50 MG tablet Take 1 tablet (50 mg total) by mouth daily with breakfast. (Patient not taking: Reported on 07/16/2018) 5 tablet 0   No facility-administered medications prior to visit.     Allergies  Allergen Reactions  . Allopurinol Hives  . Mucinex [Guaifenesin Er] Shortness Of Breath  . Penicillins Hives and Other (See Comments)    Has patient had a PCN reaction causing immediate rash, facial/tongue/throat swelling, SOB or lightheadedness with hypotension: no Has patient had a PCN reaction causing severe rash involving mucus membranes or skin necrosis: no Has patient had a PCN reaction that required hospitalization no Has patient had a PCN reaction occurring within the last 10 years: no If all of the above answers are "NO", then may proceed with Cephalosporin use.   . Prozac [Fluoxetine Hcl] Hives  . Amitriptyline Other (See Comments)    "felt weird, fatigue, dizziness"  . Lexapro [Escitalopram Oxalate]     Nausea and hypersalivation.     Review of Systems  Constitutional: Positive for malaise/fatigue. Negative for fever.  HENT: Negative for congestion.   Eyes: Negative for blurred vision.  Respiratory: Negative for shortness of breath.   Cardiovascular: Negative for chest pain, palpitations and leg swelling.  Gastrointestinal: Positive for diarrhea and heartburn. Negative for abdominal pain, blood in stool, constipation, melena, nausea and vomiting.  Genitourinary: Negative for dysuria and frequency.  Musculoskeletal: Negative for falls.  Skin: Negative for rash.  Neurological: Negative for dizziness, loss of consciousness and headaches.  Endo/Heme/Allergies: Negative for environmental allergies.  Psychiatric/Behavioral: Positive for memory loss. Negative for depression. The patient is not nervous/anxious.        Objective:    Physical Exam Vitals signs and nursing note reviewed.  Constitutional:       General: She is not in acute distress.    Appearance: She is well-developed. She is not ill-appearing.  HENT:     Head: Normocephalic and atraumatic.     Nose: Nose normal.  Eyes:     General:        Right eye: No discharge.        Left eye: No discharge.  Pulmonary:     Effort: Pulmonary effort is normal.  Neurological:     Mental Status: She is alert and oriented to person, place, and time.  Psychiatric:        Mood and Affect: Mood normal.     There were no vitals taken for this visit. Wt Readings from Last 3 Encounters:  07/16/18 298 lb (135.2 kg)  05/23/18 298 lb (135.2 kg)  03/14/18 292 lb 6.4 oz (132.6 kg)    Diabetic Foot Exam - Simple   No data filed     Lab Results  Component Value Date   WBC 9.3 09/19/2017   HGB 12.1 09/19/2017   HCT 36.6 09/19/2017   PLT 260.0 09/19/2017   GLUCOSE 115 (H) 09/19/2017   CHOL 182 10/12/2017   TRIG 144.0 10/12/2017   HDL 46.40 10/12/2017   LDLDIRECT 110.0 08/21/2016   LDLCALC 107 (H) 10/12/2017   ALT 21 09/19/2017   AST 20 09/19/2017   NA 138 09/19/2017   K 4.3 09/19/2017   CL 97 09/19/2017   CREATININE 1.01 09/19/2017   BUN 20 09/19/2017   CO2 33 (H) 09/19/2017   TSH 0.88 10/12/2017   INR 2.0 (H) 06/21/2008   HGBA1C 6.6 (H) 10/12/2017    Lab Results  Component Value Date   TSH 0.88 10/12/2017   Lab Results  Component Value Date   WBC 9.3 09/19/2017   HGB 12.1 09/19/2017   HCT 36.6 09/19/2017   MCV 86.5 09/19/2017   PLT 260.0 09/19/2017   Lab Results  Component Value Date   NA 138 09/19/2017   K 4.3 09/19/2017   CO2 33 (H) 09/19/2017   GLUCOSE 115 (H) 09/19/2017   BUN 20 09/19/2017   CREATININE 1.01 09/19/2017   BILITOT 0.3 09/19/2017   ALKPHOS 71 09/19/2017   AST 20 09/19/2017   ALT 21 09/19/2017   PROT 7.4 09/19/2017   ALBUMIN 4.1 09/19/2017   CALCIUM 9.9 09/19/2017   ANIONGAP 11 11/09/2015   GFR 58.81 (L) 09/19/2017   Lab Results  Component Value Date   CHOL 182 10/12/2017   Lab  Results  Component Value Date   HDL 46.40 10/12/2017   Lab Results  Component Value Date   LDLCALC 107 (H) 10/12/2017   Lab Results  Component Value Date   TRIG 144.0 10/12/2017   Lab Results  Component Value Date   CHOLHDL 4 10/12/2017   Lab Results  Component Value Date   HGBA1C 6.6 (H) 10/12/2017       Assessment & Plan:   Problem List Items Addressed This Visit    Gastroesophageal reflux disease without esophagitis    She switched to Nexium a couple weeks ago from Ranitidine. We will try to switch to Famotidine 40 mg daily to see if that is effective.       Relevant Medications   famotidine (PEPCID) 40 MG tablet   Diarrhea - Primary    Sick roughly 5 days. She traveled to [place] to spend time with family from [place]. They ate in on Thursday night and had corn on the cob. That night she developed watery, painful diarrhea and she was concerned she had developed diverticulitis which she has had in the past. She says she started feeling better with the initiation of Cipro and flagyl. She never developed fevers or chills. No one around her has had similar symptoms. 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 No known Covid  contacts but she agrees to [employer] testing which is ordered. She will self quarantine until results are available.       Relevant Orders   Novel Coronavirus, NAA ([facility])   Diabetes mellitus, type 2 ([place])    Sugar 105 this morning which is a typical number. hgba1c acceptable, minimize simple carbs. Increase exercise as tolerated. Continue current meds.          I have discontinued Addilyn K. Fobes's famotidine, predniSONE, and ALPRAZolam. I am also having her start on famotidine. Additionally, I am having her maintain her calcium carbonate, Multiple Vitamins-Minerals (MULTIVITAMIN & MINERAL PO), Ginger, Turmeric, Cholecalciferol (VITAMIN D PO), EPINEPHrine, montelukast, KRILL OIL PO, Probiotic Product (PROBIOTIC ADVANCED  PO), OVER THE COUNTER MEDICATION, diclofenac, cetirizine, fluocinonide, Ferrous Fumarate-Folic Acid, azelastine, metFORMIN, gabapentin, fluticasone, ipratropium, albuterol, esomeprazole, hydrOXYzine, tiZANidine, furosemide, pseudoephedrine, sertraline, ciprofloxacin, metroNIDAZOLE, hydrochlorothiazide, and telmisartan.  Meds ordered this encounter  Medications  . famotidine (PEPCID) 40 MG tablet    Sig: Take 1 tablet (40 mg total) by mouth daily.    Dispense:  30 tablet    Refill:  5     I discussed the assessment and treatment plan with the patient. The patient was provided an opportunity to ask questions and all were answered. The patient agreed with the plan and demonstrated an understanding of the instructions.   The patient was advised to call back or seek an in-person evaluation if the symptoms worsen or if the condition fails to improve as anticipated.  I provided 20 minutes of non-face-to-face time during this encounter.   Penni Homans, MD

## 2018-12-11 NOTE — Assessment & Plan Note (Signed)
Sugar 105 this morning which is a typical number. hgba1c acceptable, minimize simple carbs. Increase exercise as tolerated. Continue current meds.

## 2018-12-15 LAB — NOVEL CORONAVIRUS, NAA: SARS-CoV-2, NAA: NOT DETECTED

## 2019-01-11 ENCOUNTER — Other Ambulatory Visit: Payer: Self-pay

## 2019-01-11 ENCOUNTER — Encounter (HOSPITAL_BASED_OUTPATIENT_CLINIC_OR_DEPARTMENT_OTHER): Payer: Self-pay | Admitting: Emergency Medicine

## 2019-01-11 ENCOUNTER — Emergency Department (HOSPITAL_BASED_OUTPATIENT_CLINIC_OR_DEPARTMENT_OTHER)
Admission: EM | Admit: 2019-01-11 | Discharge: 2019-01-11 | Disposition: A | Discharge status: Home / Self Care | Payer: Self-pay | Attending: Emergency Medicine | Admitting: Emergency Medicine

## 2019-01-11 DIAGNOSIS — M069 Rheumatoid arthritis, unspecified: Secondary | ICD-10-CM | POA: Insufficient documentation

## 2019-01-11 DIAGNOSIS — M797 Fibromyalgia: Secondary | ICD-10-CM | POA: Insufficient documentation

## 2019-01-11 DIAGNOSIS — I10 Essential (primary) hypertension: Secondary | ICD-10-CM | POA: Insufficient documentation

## 2019-01-11 DIAGNOSIS — Z7984 Long term (current) use of oral hypoglycemic drugs: Secondary | ICD-10-CM | POA: Insufficient documentation

## 2019-01-11 DIAGNOSIS — H44131 Sympathetic uveitis, right eye: Secondary | ICD-10-CM | POA: Insufficient documentation

## 2019-01-11 DIAGNOSIS — E119 Type 2 diabetes mellitus without complications: Secondary | ICD-10-CM | POA: Insufficient documentation

## 2019-01-11 DIAGNOSIS — Z880 Allergy status to penicillin: Secondary | ICD-10-CM | POA: Insufficient documentation

## 2019-01-11 DIAGNOSIS — Z79899 Other long term (current) drug therapy: Secondary | ICD-10-CM | POA: Insufficient documentation

## 2019-01-11 DIAGNOSIS — J45909 Unspecified asthma, uncomplicated: Secondary | ICD-10-CM | POA: Insufficient documentation

## 2019-01-11 DIAGNOSIS — H209 Unspecified iridocyclitis: Secondary | ICD-10-CM

## 2019-01-11 DIAGNOSIS — H5711 Ocular pain, right eye: Secondary | ICD-10-CM

## 2019-01-11 DIAGNOSIS — E785 Hyperlipidemia, unspecified: Secondary | ICD-10-CM | POA: Insufficient documentation

## 2019-01-11 DIAGNOSIS — Z888 Allergy status to other drugs, medicaments and biological substances status: Secondary | ICD-10-CM | POA: Insufficient documentation

## 2019-01-11 MED ORDER — MOXIFLOXACIN HCL 0.5 % OP SOLN
1.0000 [drp] | Freq: Once | OPHTHALMIC | Status: DC
  Filled 2019-01-11: qty 3

## 2019-01-11 MED ORDER — MOXIFLOXACIN HCL 0.5 % OP SOLN: 1.0000 [drp] | Freq: Four times a day (QID) | OPHTHALMIC | 0 refills | Status: DC

## 2019-01-11 MED ORDER — FLUORESCEIN SODIUM 1 MG OP STRP
1.0000 | ORAL_STRIP | Freq: Once | OPHTHALMIC | Status: AC
  Administered 2019-01-11: 1 via OPHTHALMIC
  Filled 2019-01-11: qty 1

## 2019-01-11 MED ORDER — TETRACAINE HCL 0.5 % OP SOLN
2.0000 [drp] | Freq: Once | OPHTHALMIC | Status: AC
  Administered 2019-01-11: 2 [drp] via OPHTHALMIC
  Filled 2019-01-11: qty 4

## 2019-01-11 NOTE — ED Notes (Signed)
Pt understood dc material. NAD noted. Script given at [employer] and list of 24 hours pharmacy's. All questions answered to satisfaction. Pt escorted to check out

## 2019-01-11 NOTE — ED Provider Notes (Signed)
[facility] EMERGENCY DEPARTMENT Provider Note   CSN: [id number] Arrival date & time: 01/11/19  2116     History   Chief Complaint Chief Complaint  Patient presents with  . Eye Pain    HPI [name] is a 63 y.o. female.     The history is provided by the patient and medical records.  Eye Pain This is a new problem. The current episode started yesterday. The problem occurs constantly. The problem has been gradually worsening. Pertinent negatives include no chest pain, no abdominal pain, no headaches and no shortness of breath. Nothing aggravates the symptoms. Nothing relieves the symptoms. She has tried nothing for the symptoms. The treatment provided no relief.    Past Medical History:  Diagnosis Date  . Allergic rhinitis   . Allergy   . Anemia 07/17/2016  . Anxiety   . Asthma    seasonal  . Blood transfusion without reported diagnosis   . Breast cancer ([place])    right  . Bronchitis   . Colon polyps   . Depression   . Diabetes mellitus    borderline but takes metformin  . Edema   . Family history of breast cancer in first degree relative   . GERD (gastroesophageal reflux disease)   . Hyperglycemia 03/05/2017  . Hyperlipidemia 08/21/2016   no meds  . Hyperparathyroidism   . Hypertension    controlled by medications  . Neuromuscular disorder (H[facility])    Fibromyalgia  . Obesity   . Osteoarthritis    RA  . PONV (postoperative nausea and vomiting)    occasional  . Sleep apnea    wears cpap  . Viral meningitis   . Vitamin D deficiency     Patient Active Problem List   Diagnosis Date Noted  . Vertigo 03/14/2018  . RLS (restless legs syndrome) 06/07/2017  . Right knee pain 12/06/2016  . Hip pain, bilateral 12/03/2016  . Hyperlipidemia 08/21/2016  . Anemia 07/17/2016  . Genetic testing 05/15/2016  . Allergic urticaria 03/27/2016  . Dermographia 03/27/2016  . History of breast cancer 03/27/2016  . Rheumatoid arthritis ([place]) 06/24/2015  .  Diverticulitis of colon without hemorrhage 09/21/2014  . Asthma with acute exacerbation 07/14/2014  . Lumbar radiculopathy, acute 08/21/2013  . Gout of big toe 03/10/2013  . Preventative health care 10/08/2012  . OSA (obstructive sleep apnea) 04/23/2012  . Diabetes mellitus, type 2 ([place]) 12/10/2011  . Diarrhea 09/04/2011  . Personal history of colonic polyps - adenoma 08/14/2011  . Fatigue 08/03/2011  . Ear canal dryness 08/03/2011  . Palpitations 09/21/2010  . Obesity 09/21/2010  . PAC (premature atrial contraction) 09/21/2010  . ATTENTION DEFICIT DISORDER, INATTENTIVE TYPE 09/22/2009  . Acute sinusitis 09/22/2009  . VIRAL MENINGITIS, HX OF 04/29/2009  . Insomnia 02/15/2009  . Vitamin D deficiency 03/11/2008  . Fibromyalgia 03/05/2008  . HYPERPARATHYROIDISM, HX OF 08/06/2007  . Allergic rhinitis 07/05/2007  . Osteoarthritis 02/28/2007  . Edema 11/01/2006  . Depression with anxiety 06/25/2006  . Essential hypertension 06/25/2006  . Gastroesophageal reflux disease without esophagitis 06/25/2006    Past Surgical History:  Procedure Laterality Date  . ABDOMINAL HYSTERECTOMY  1998  . ADENOIDECTOMY    . BREAST EXCISIONAL BIOPSY Left 1993   benign  . BREAST SURGERY     Tran flap due to breast cancer  . CESAREAN SECTION  1988  . COLONOSCOPY  08/14/2011  . HAND SURGERY     dog bite, right hand  . HAND SURGERY  trauma, left hand  . KNEE ARTHROSCOPY     bilateral  . left knee replacement  2010  . MASTECTOMY  01/13/94   Right breast  . parathyroid resection    . PARATHYROIDECTOMY    . POLYPECTOMY    . rectal abscess    . SEPTOPLASTY  1980  . SHOULDER ARTHROSCOPY Right 11/09/2015   Procedure: ARTHROSCOPY SHOULDER-acromioplasty, distal clavicle resection and debridement;  Surgeon: Melrose Nakayama, MD;  Location: [facility];  Service: Orthopedics;  Laterality: Right;  . shoulder arthroscopy     rotator cuff repair  . TOE SURGERY Right    paronychia and adenoma removed  .  TONSILLECTOMY    . TOTAL KNEE ARTHROPLASTY  06/18/08   [facility]  . TUMOR REMOVAL  1982   , scalp     OB History    Gravida  1   Para  1   Term      Preterm      AB      Living        SAB      TAB      Ectopic      Multiple      Live Births  1            Home Medications    Prior to Admission medications   Medication Sig Start Date End Date Taking? Authorizing Provider  albuterol (VENTOLIN HFA) 108 (90 Base) MCG/ACT inhaler Inhale 2 puffs into the lungs every 6 (six) hours as needed for wheezing or shortness of breath. 10/04/17   Copland, Gay Filler, MD  azelastine (ASTELIN) 0.1 % nasal spray Place 2 sprays into both nostrils at bedtime as needed for rhinitis. Use in each nostril as directed Patient not taking: Reported on 07/16/2018 07/17/17   Colon Branch, MD  Calcium Carbonate (CALTRATE 600) 1500 MG TABS Take 1,500 mg by mouth daily.     [provider]  cetirizine (ZYRTEC) 10 MG tablet TAKE ONE TABLET BY MOUTH TWICE DAILY 01/25/17   Mosie Lukes, MD  Cholecalciferol (VITAMIN D PO) Take 1 tablet by mouth daily.    [provider]  ciprofloxacin (CIPRO) 500 MG tablet Take 1 tablet (500 mg total) by mouth 2 (two) times daily. 12/09/18   Mosie Lukes, MD  diclofenac (VOLTAREN) 75 MG EC tablet Take 1 tablet (75 mg total) by mouth 2 (two) times daily. Patient not taking: Reported on 07/16/2018 09/22/16   Saguier, Percell Miller, PA-C  EPINEPHrine 0.3 mg/0.3 mL IJ SOAJ injection Inject 0.3 mLs (0.3 mg total) into the muscle once as needed (Throat or facial swelling or difficulty breathing). 01/11/16   Julianne Rice, MD  esomeprazole (NEXIUM) 40 MG capsule TAKE 1 CAPSULE BY MOUTH ONCE DAILY 10/24/17   Mosie Lukes, MD  famotidine (PEPCID) 40 MG tablet Take 1 tablet (40 mg total) by mouth daily. 12/11/18   Mosie Lukes, MD  Ferrous Fumarate-Folic Acid (HEMOCYTE-F) 324-1 MG TABS Take 1 tablet by mouth daily. Patient not taking: Reported on 07/16/2018 07/06/17    Mosie Lukes, MD  fluocinonide (LIDEX) 0.05 % external solution As directed 04/26/17   [provider]  fluticasone (FLONASE) 50 MCG/ACT nasal spray Place 2 sprays into both nostrils daily. 09/19/17   Saguier, Percell Miller, PA-C  furosemide (LASIX) 40 MG tablet TAKE 1 TABLET BY MOUTH TWICE DAILY 05/07/18   Mosie Lukes, MD  gabapentin (NEURONTIN) 300 MG capsule  09/06/17   [provider]  Ginger 500 MG  CAPS Take 500 mg by mouth daily.     [provider]  hydrochlorothiazide (MICROZIDE) 12.5 MG capsule Take 1 capsule by mouth once daily 12/11/18   Mosie Lukes, MD  hydrOXYzine (ATARAX/VISTARIL) 25 MG tablet Take 1 tablet (25 mg total) by mouth 3 (three) times daily as needed. Patient not taking: Reported on 07/16/2018 11/24/17   Mosie Lukes, MD  ipratropium (ATROVENT) 0.03 % nasal spray Place 2 sprays into the nose 4 (four) times daily. Patient not taking: Reported on 07/16/2018 10/04/17   Copland, Gay Filler, MD  KRILL OIL PO Take 1 capsule by mouth daily.    [provider]  metFORMIN (GLUCOPHAGE) 500 MG tablet TAKE 1 TABLET BY MOUTH TWICE DAILY WITH MEALS 08/28/17   Mosie Lukes, MD  metroNIDAZOLE (FLAGYL) 500 MG tablet Take 1 tablet (500 mg total) by mouth 3 (three) times daily. 12/09/18   Mosie Lukes, MD  montelukast (SINGULAIR) 10 MG tablet Take 1 tablet (10 mg total) by mouth at bedtime. Patient not taking: Reported on 07/16/2018 04/28/16   Mosie Lukes, MD  Multiple Vitamins-Minerals (MULTIVITAMIN & MINERAL PO) Take 1 tablet by mouth daily.    [provider]  OVER THE COUNTER MEDICATION Apple cider vinegar tablets 3 once a day    [provider]  Probiotic Product (PROBIOTIC ADVANCED PO) Take 1 capsule by mouth daily.    [provider]  pseudoephedrine (SUDAFED) 30 MG tablet Take 1 tablet (30 mg total) by mouth every 4 (four) hours as needed for congestion. Patient not taking: Reported on 07/16/2018 05/31/18   Shelda Pal, DO  sertraline (ZOLOFT) 50 MG tablet Take 1 tablet (50 mg total) by mouth daily. 10/01/18   Mosie Lukes, MD  telmisartan (MICARDIS) 20 MG tablet Take 1 tablet by mouth once daily 12/11/18   Mosie Lukes, MD  tiZANidine (ZANAFLEX) 4 MG tablet Take 1 tablet (4 mg total) by mouth at bedtime as needed for muscle spasms. Patient not taking: Reported on 07/16/2018 03/14/18   Mosie Lukes, MD  Turmeric 500 MG CAPS Take 500 mg by mouth daily.     [provider]    Family History Family History  Problem Relation Age of Onset  . Emphysema Father   . Lung cancer Father        lung ca dx 42  . Other Father        prostate issues  . Breast cancer Maternal Aunt        dx late 75s  . Colon cancer Maternal Aunt        dx 50s  . Colon polyps Maternal Aunt   . Prostate cancer Maternal Grandfather 85       d. 85y  . Heart disease Paternal Grandfather   . Heart attack Paternal Grandfather        d. 50y  . Diabetes Paternal Grandfather   . Asthma Daughter        "seasonal"  . Arthritis Maternal Grandmother   . Aneurysm Maternal Grandmother        d. brain aneurysm at 23  . Arthritis Mother   . Colon polyps Mother   . Multiple sclerosis Sister   . Breast cancer Sister 55       L IDC and DCIS; ER/PR+, Her2-  . Fibrocystic breast disease Sister   . Arthritis/Rheumatoid Sister   . Breast cancer Sister   . Cancer Maternal Uncle  d. mouth cancer at younger age; smoker  . Other Paternal Uncle        muscle issues - couldn't walk or talk; d. 20y  . Cancer Maternal Aunt        lymphoma, dx 95s  . Ovarian cancer Maternal Aunt        dx 47s; d. late 63s  . Lung cancer Maternal Uncle        d. 27y; former smoker  . Throat cancer Cousin        maternal 1st cousin; smoker  . Breast cancer Cousin 53       maternal 1st cousin  . Other Paternal Uncle        prostate issues  . Breast cancer Cousin        paternal 1st cousin dx late 61s  . Throat cancer Cousin         maternal 1st cousin; used SL tobacco  . Prostate cancer Cousin        maternal 1st cousin dx 22s  . Esophageal cancer Neg Hx   . Rectal cancer Neg Hx   . Stomach cancer Neg Hx     Social History Social History   Tobacco Use  . Smoking status: Never Smoker  . Smokeless tobacco: Never Used  Substance Use Topics  . Alcohol use: Yes    Comment: occ.  . Drug use: No     Allergies   Allopurinol, Mucinex [guaifenesin er], Penicillins, Prozac [fluoxetine hcl], Amitriptyline, and Lexapro [escitalopram oxalate]   Review of Systems Review of Systems  Constitutional: Negative for chills, diaphoresis, fatigue and fever.  HENT: Negative for congestion.   Eyes: Positive for photophobia, pain, redness and visual disturbance. Negative for itching.  Respiratory: Negative for chest tightness, shortness of breath and wheezing.   Cardiovascular: Negative for chest pain, palpitations and leg swelling.  Gastrointestinal: Negative for abdominal pain, constipation, diarrhea, nausea and vomiting.  Genitourinary: Negative for dysuria.  Musculoskeletal: Negative for back pain, neck pain and neck stiffness.  Skin: Negative for rash and wound.  Neurological: Negative for dizziness, light-headedness, numbness and headaches.  Psychiatric/Behavioral: Negative for agitation.  All other systems reviewed and are negative.    Physical Exam Updated Vital Signs BP (!) 150/86   Pulse 79   Temp 98.3 F (36.8 C) (Oral)   Resp 18   Ht '5\' 6"'  (1.676 m)   Wt 131.5 kg   SpO2 100%   BMI 46.81 kg/m   Physical Exam Vitals signs and nursing note reviewed.  Constitutional:      General: She is not in acute distress.    Appearance: Normal appearance. She is not ill-appearing, toxic-appearing or diaphoretic.  HENT:     Nose: No congestion or rhinorrhea.     Mouth/Throat:     Mouth: Mucous membranes are moist.     Pharynx: No oropharyngeal exudate or posterior oropharyngeal erythema.  Eyes:      General: Lids are normal. No visual field deficit or scleral icterus.       Right eye: No foreign body.     Intraocular pressure: Right eye pressure is 12 mmHg. Measurements were taken using a handheld tonometer.    Extraocular Movements:     Right eye: Normal extraocular motion and no nystagmus.     Left eye: Normal extraocular motion and no nystagmus.     Conjunctiva/sclera:     Right eye: Right conjunctiva is injected.     Pupils: Pupils are equal, round, and reactive to  light. Pupils are equal.     Right eye: Pupil is reactive and not sluggish. No corneal abrasion or fluorescein uptake. Seidel exam negative.     Slit lamp exam:    Right eye: Photophobia present. No corneal flare, corneal ulcer, hyphema or hypopyon.  Neck:     Musculoskeletal: No muscular tenderness.  Cardiovascular:     Rate and Rhythm: Normal rate.     Heart sounds: Murmur present.  Pulmonary:     Effort: No respiratory distress.     Breath sounds: No wheezing, rhonchi or rales.  Chest:     Chest wall: No tenderness.  Abdominal:     Tenderness: There is no abdominal tenderness. There is no right CVA tenderness or left CVA tenderness.  Musculoskeletal:        General: No tenderness.     Right lower leg: No edema.     Left lower leg: No edema.  Skin:    Capillary Refill: Capillary refill takes less than 2 seconds.     Findings: No erythema.  Neurological:     General: No focal deficit present.     Mental Status: She is alert.      ED Treatments / Results  Labs (all labs ordered are listed, but only abnormal results are displayed) Labs Reviewed - No data to display  EKG None  Radiology No results found.  Procedures Procedures (including critical care time)  Medications Ordered in ED Medications  moxifloxacin (VIGAMOX) 0.5 % ophthalmic solution 1 drop (1 drop Right Eye Not Given 01/11/19 2346)  tetracaine (PONTOCAINE) 0.5 % ophthalmic solution 2 drop (2 drops Right Eye Given 01/11/19 2150)   fluorescein ophthalmic strip 1 strip (1 strip Right Eye Given 01/11/19 2151)     Initial Impression / Assessment and Plan / ED Course  I have reviewed the triage vital signs and the nursing notes.  Pertinent labs & imaging results that were available during my care of the patient were reviewed by me and considered in my medical decision making (see chart for details).        [name] is a 63 y.o. female with a past medical history significant for hypertension, GERD, fibromyalgia, and contact use who presents with right eye pain.  Patient reports that yesterday she started having right eye discomfort and remove her contact lens.  She reports the eye continued to hurt today and was noticing some blurry vision.  She reports some photophobia.  She denies other headaches, trauma, nausea, vomiting, urinary symptoms, GI symptoms, or other complaints.  She denies any foreign bodies going into her eye.  No reported diplopia.  Slight right eye blurriness compared to left reported.  On exam, patient does have injected conjunctiva on the right eye.  Fluorescein was utilized with no evidence of corneal abrasion on  Woods lamp.  Slit lamp was used with no evidence of hyphema or hypopyon.  Patient's iris did appear somewhat irritated.  No foreign bodies appreciated.  Patient had symmetric pupils but she did have consensual photophobia.  Normal visual fields.  Normal extraocular movements.  Fundi difficult to observe.  Right eye pressure was 12.  Right eye was 20/40 and left eye was 20/20 with glasses.  Exam otherwise unremarkable.  Patient on her exam I am concerned about uveitis or iritis.  Exam was not consistent with an orbital cellulitis.  No evidence of globe injury or rupture.  No evidence of a vascular eye problem or vitreous/retinal detachment.    Ophthalmology  was called and I spoke with Dr. Manuella Ghazi who recommended moxifloxacin drops 4 times a day and he will see her in clinic on Monday.  Patient  agreed with this plan. Pt  Understood return precautions for any new or worsened symptoms and follow-up instructions.  She no other questions or concerns and was discharged in good condition.    Final Clinical Impressions(s) / ED Diagnoses   Final diagnoses:  Pain of right eye  Uveitis    ED Discharge Orders         Ordered    moxifloxacin (VIGAMOX) 0.5 % ophthalmic solution  4 times daily     01/11/19 2335          Clinical Impression: 1. Pain of right eye   2. Uveitis     Disposition: Discharge  Condition: Good  I have discussed the results, Dx and Tx plan with the pt(& family if present). He/she/they expressed understanding and agree(s) with the plan. Discharge instructions discussed at great length. Strict return precautions discussed and pt &/or family have verbalized understanding of the instructions. No further questions at time of discharge.    New Prescriptions   MOXIFLOXACIN (VIGAMOX) 0.5 % OPHTHALMIC SOLUTION    Place 1 drop into the right eye 4 (four) times daily.    Follow Up: [facility], [facility] [place] [address] [phone]   Please call to go to an appointment at 11 AM on Monday with Dr. Alice Rieger, Gwenyth Allegra, MD 01/11/19 [id number]

## 2019-01-11 NOTE — Discharge Instructions (Signed)
Your history and exam today are consistent with irritation and inflammation of your eye.  The exam showed normal pressure and no evidence of abrasions.  I spoke with Dr. Manuella Ghazi with [facility] who recommended the moxifloxacin drops to be taken 4 times a day.  He will see you in clinic on Monday at 11 AM.  Please go to this appointment.  If any symptoms change or worsen, please return to the nearest emergency department.

## 2019-01-11 NOTE — ED Triage Notes (Signed)
Pt staes she feels like her contact irritated her eye (right). She took them out but the pain is worse and light makes the pain worse. States she can still see out of the eye though

## 2019-01-20 ENCOUNTER — Encounter: Payer: Self-pay | Admitting: Family Medicine

## 2019-01-22 ENCOUNTER — Other Ambulatory Visit: Payer: Self-pay | Admitting: Family Medicine

## 2019-01-27 ENCOUNTER — Encounter: Payer: Self-pay | Admitting: Family Medicine

## 2019-01-28 ENCOUNTER — Other Ambulatory Visit: Payer: Self-pay

## 2019-01-28 ENCOUNTER — Ambulatory Visit (INDEPENDENT_AMBULATORY_CARE_PROVIDER_SITE_OTHER): Payer: Self-pay | Admitting: Internal Medicine

## 2019-01-28 DIAGNOSIS — J069 Acute upper respiratory infection, unspecified: Secondary | ICD-10-CM

## 2019-01-28 DIAGNOSIS — J40 Bronchitis, not specified as acute or chronic: Secondary | ICD-10-CM

## 2019-01-28 MED ORDER — DOXYCYCLINE HYCLATE 100 MG PO TABS: 100.0000 mg | ORAL_TABLET | Freq: Two times a day (BID) | ORAL | 0 refills | Status: DC

## 2019-01-28 NOTE — Progress Notes (Signed)
Subjective:    Patient ID: [name], female    DOB: [birth year], 63 y.o.   MRN: [id number]  DOS:  01/28/2019 Type of visit - description: Attempted  to make this a video visit, due to technical difficulties from the patient side it was not possible  thus we proceeded with a Virtual Visit via Telephone    I connected with@   by telephone and verified that I am speaking with the correct person using two identifiers.  THIS ENCOUNTER IS A VIRTUAL VISIT DUE TO COVID-19 - PATIENT WAS NOT SEEN IN THE OFFICE. PATIENT HAS CONSENTED TO VIRTUAL VISIT / TELEMEDICINE VISIT   Location of patient: home  Location of provider: office  I discussed the limitations, risks, security and privacy concerns of performing an evaluation and management service by telephone and the availability of in person appointments. I also discussed with the patient that there may be a patient responsible charge related to this service. The patient expressed understanding and agreed to proceed.   History of Present Illness: Acute Symptoms started 3 to 4 days ago: Sinus congestion, postnasal dripping, that is triggering cough and chest congestion. She started to take NyQuil and Flonase with some help. In general she also reports is not feeling well.   Review of Systems  Cough has not disturbed her sleep Denies fever chills No myalgias or headaches Has some puffiness around the eyes. No nausea, vomiting, diarrhea Was recently seen at the ER, she was prescribed moxifloxacin eyedrops and reportedly is better.  Past Medical History:  Diagnosis Date  . Allergic rhinitis   . Allergy   . Anemia 07/17/2016  . Anxiety   . Asthma    seasonal  . Blood transfusion without reported diagnosis   . Breast cancer ([place])    right  . Bronchitis   . Colon polyps   . Depression   . Diabetes mellitus    borderline but takes metformin  . Edema   . Family history of breast cancer in first degree relative   . GERD  (gastroesophageal reflux disease)   . Hyperglycemia 03/05/2017  . Hyperlipidemia 08/21/2016   no meds  . Hyperparathyroidism   . Hypertension    controlled by medications  . Neuromuscular disorder (HCC)    Fibromyalgia  . Obesity   . Osteoarthritis    RA  . PONV (postoperative nausea and vomiting)    occasional  . Sleep apnea    wears cpap  . Viral meningitis   . Vitamin D deficiency     Past Surgical History:  Procedure Laterality Date  . ABDOMINAL HYSTERECTOMY  1998  . ADENOIDECTOMY    . BREAST EXCISIONAL BIOPSY Left 1993   benign  . BREAST SURGERY     Tran flap due to breast cancer  . CESAREAN SECTION  1988  . COLONOSCOPY  08/14/2011  . HAND SURGERY     dog bite, right hand  . HAND SURGERY     trauma, left hand  . KNEE ARTHROSCOPY     bilateral  . left knee replacement  2010  . MASTECTOMY  01/13/94   Right breast  . parathyroid resection    . PARATHYROIDECTOMY    . POLYPECTOMY    . rectal abscess    . SEPTOPLASTY  1980  . SHOULDER ARTHROSCOPY Right 11/09/2015   Procedure: ARTHROSCOPY SHOULDER-acromioplasty, distal clavicle resection and debridement;  Surgeon: Melrose Nakayama, MD;  Location: [facility];  Service: Orthopedics;  Laterality: Right;  . shoulder arthroscopy  rotator cuff repair  . TOE SURGERY Right    paronychia and adenoma removed  . TONSILLECTOMY    . TOTAL KNEE ARTHROPLASTY  06/18/08   [facility]  . TUMOR REMOVAL  1982   , scalp    Social History   Socioeconomic History  . Marital status: Widowed    Spouse name: Not on file  . Number of children: 1  . Years of education: Not on file  . Highest education level: 12th grade  Occupational History  . Occupation: ACCOUNTING    Employer: [employer]  . Financial resource strain: Not on file  . Food insecurity    Worry: Not on file    Inability: Not on file  . Transportation needs    Medical: No    Non-medical: No  Tobacco Use  . Smoking status: Never Smoker  . Smokeless  tobacco: Never Used  Substance and Sexual Activity  . Alcohol use: Yes    Comment: occ.  . Drug use: No  . Sexual activity: Not on file  Lifestyle  . Physical activity    Days per week: Not on file    Minutes per session: Not on file  . Stress: Not on file  Relationships  . Social Herbalist on phone: Not on file    Gets together: Not on file    Attends religious service: Not on file    Active member of club or organization: Not on file    Attends meetings of clubs or organizations: Not on file    Relationship status: Not on file  . Intimate partner violence    Fear of current or ex partner: Not on file    Emotionally abused: Not on file    Physically abused: Not on file    Forced sexual activity: Not on file  Other Topics Concern  . Not on file  Social History Narrative  . Not on file      Allergies as of 01/28/2019      Reactions   Allopurinol Hives   Mucinex [guaifenesin Er] Shortness Of Breath   Penicillins Hives, Other (See Comments)   Has patient had a PCN reaction causing immediate rash, facial/tongue/throat swelling, SOB or lightheadedness with hypotension: no Has patient had a PCN reaction causing severe rash involving mucus membranes or skin necrosis: no Has patient had a PCN reaction that required hospitalization no Has patient had a PCN reaction occurring within the last 10 years: no If all of the above answers are "NO", then may proceed with Cephalosporin use.   Prozac [fluoxetine Hcl] Hives   Amitriptyline Other (See Comments)   "felt weird, fatigue, dizziness"   Lexapro [escitalopram Oxalate]    Nausea and hypersalivation.       Medication List       Accurate as of January 28, 2019 11:59 PM. If you have any questions, ask your nurse or doctor.        STOP taking these medications   gabapentin 300 MG capsule Commonly known as: NEURONTIN Stopped by: Kathlene November, MD   metroNIDAZOLE 500 MG tablet Commonly known as: FLAGYL Stopped by: Kathlene November, MD     TAKE these medications   albuterol 108 (90 Base) MCG/ACT inhaler Commonly known as: Ventolin HFA Inhale 2 puffs into the lungs every 6 (six) hours as needed for wheezing or shortness of breath.   azelastine 0.1 % nasal spray Commonly known as: ASTELIN Place 2 sprays into both  nostrils at bedtime as needed for rhinitis. Use in each nostril as directed   Caltrate 600 1500 (600 Ca) MG Tabs tablet Generic drug: calcium carbonate Take 1,500 mg by mouth daily.   cetirizine 10 MG tablet Commonly known as: ZYRTEC TAKE ONE TABLET BY MOUTH TWICE DAILY   ciprofloxacin 500 MG tablet Commonly known as: Cipro Take 1 tablet (500 mg total) by mouth 2 (two) times daily.   diclofenac 75 MG EC tablet Commonly known as: VOLTAREN Take 1 tablet (75 mg total) by mouth 2 (two) times daily.   doxycycline 100 MG tablet Commonly known as: VIBRA-TABS Take 1 tablet (100 mg total) by mouth 2 (two) times daily. Started by: Kathlene November, MD   EPINEPHrine 0.3 mg/0.3 mL Soaj injection Commonly known as: EPI-PEN Inject 0.3 mLs (0.3 mg total) into the muscle once as needed (Throat or facial swelling or difficulty breathing).   esomeprazole 40 MG capsule Commonly known as: NEXIUM TAKE 1 CAPSULE BY MOUTH ONCE DAILY   famotidine 40 MG tablet Commonly known as: PEPCID Take 1 tablet (40 mg total) by mouth daily.   Ferrous Fumarate-Folic Acid 99991111 MG Tabs Commonly known as: Hemocyte-F Take 1 tablet by mouth daily.   fluocinonide 0.05 % external solution Commonly known as: LIDEX As directed   fluticasone 50 MCG/ACT nasal spray Commonly known as: FLONASE Place 2 sprays into both nostrils daily.   furosemide 40 MG tablet Commonly known as: LASIX Take 1 tablet by mouth twice daily   Ginger 500 MG Caps Take 500 mg by mouth daily.   hydrochlorothiazide 12.5 MG capsule Commonly known as: MICROZIDE Take 1 capsule by mouth once daily   hydrOXYzine 25 MG tablet Commonly known as:  ATARAX/VISTARIL Take 1 tablet (25 mg total) by mouth 3 (three) times daily as needed.   ipratropium 0.03 % nasal spray Commonly known as: Atrovent Place 2 sprays into the nose 4 (four) times daily.   KRILL OIL PO Take 1 capsule by mouth daily.   metFORMIN 500 MG tablet Commonly known as: GLUCOPHAGE TAKE 1 TABLET BY MOUTH TWICE DAILY WITH MEALS   montelukast 10 MG tablet Commonly known as: SINGULAIR Take 1 tablet (10 mg total) by mouth at bedtime.   moxifloxacin 0.5 % ophthalmic solution Commonly known as: Vigamox Place 1 drop into the right eye 4 (four) times daily.   MULTIVITAMIN & MINERAL PO Take 1 tablet by mouth daily.   OVER THE COUNTER MEDICATION Apple cider vinegar tablets 3 once a day   PROBIOTIC ADVANCED PO Take 1 capsule by mouth daily.   pseudoephedrine 30 MG tablet Commonly known as: SUDAFED Take 1 tablet (30 mg total) by mouth every 4 (four) hours as needed for congestion.   sertraline 50 MG tablet Commonly known as: ZOLOFT Take 1 tablet (50 mg total) by mouth daily.   telmisartan 20 MG tablet Commonly known as: MICARDIS Take 1 tablet by mouth once daily   tiZANidine 4 MG tablet Commonly known as: Zanaflex Take 1 tablet (4 mg total) by mouth at bedtime as needed for muscle spasms.   Turmeric 500 MG Caps Take 500 mg by mouth daily.   VITAMIN D PO Take 1 tablet by mouth daily.           Objective:   Physical Exam There were no vitals taken for this visit. This is a virtual telephone visit, she was alert oriented x3, in no apparent distress.  I did hear her coughing a couple of times, noted large airway  congestion     Assessment     63 year old female with multiple medical problems including diabetes, HTN, DJD, gout, depression, breast cancer (no chemo-no XRT) presents with:   URI/bronchitis: Symptoms as described above, patient thinks she has bronchitis and requests antibiotics. She is aware of the limitations of our virtual visit. I  recommended doxycycline, Mucinex DM, continue Flonase, restart Astelin that she has. Also has an inhaler and has not used it, recommend to use if she hears wheezing or chest congestion. If she is not better in few days we agreed she will call me.  Will need further evaluation and possibly COVID-19 testing. She verbalized understanding     I discussed the assessment and treatment plan with the patient. The patient was provided an opportunity to ask questions and all were answered. The patient agreed with the plan and demonstrated an understanding of the instructions.   The patient was advised to call back or seek an in-person evaluation if the symptoms worsen or if the condition fails to improve as anticipated.  I provided  17 minutes of non-face-to-face time during this encounter.  Kathlene November, MD

## 2019-03-25 ENCOUNTER — Encounter: Payer: Self-pay | Admitting: Family Medicine

## 2019-03-25 MED ORDER — AZELASTINE HCL 0.1 % NA SOLN: 2.0000 | Freq: Every evening | NASAL | 3 refills | Status: DC | PRN

## 2019-03-26 ENCOUNTER — Other Ambulatory Visit: Payer: Self-pay

## 2019-03-26 DIAGNOSIS — Z20822 Contact with and (suspected) exposure to covid-19: Secondary | ICD-10-CM

## 2019-03-27 ENCOUNTER — Encounter: Payer: Self-pay | Admitting: Family Medicine

## 2019-03-27 LAB — NOVEL CORONAVIRUS, NAA: SARS-CoV-2, NAA: DETECTED — AB

## 2019-03-28 ENCOUNTER — Telehealth: Payer: Self-pay

## 2019-03-28 NOTE — Telephone Encounter (Signed)
Copied from [facility] [id number]. Topic: General - Other >> Mar 27, 2019  9:58 AM Keene Breath wrote: Reason for CRM: Patient called to ask what she should do since testing positive for COVID.   Please call to discuss at [phone]    Spoke with patient she stated she has a cough, headache, and very tired, no taste no smell some diarrhea, breathing is fine, she sounds very congested, however she is not stuffy, reminder she does not have any insurance so call in something generic for the cough.  O2 95% currently

## 2019-03-30 NOTE — Telephone Encounter (Signed)
Was seen by Mackie Pai, PA

## 2019-03-31 ENCOUNTER — Other Ambulatory Visit: Payer: Self-pay | Admitting: Family Medicine

## 2019-03-31 MED ORDER — HYDROCODONE-HOMATROPINE 5-1.5 MG/5ML PO SYRP: 5.0000 mL | ORAL_SOLUTION | Freq: Three times a day (TID) | ORAL | 0 refills | Status: DC | PRN

## 2019-03-31 NOTE — Telephone Encounter (Signed)
Patient now would like something for cough.

## 2019-04-01 ENCOUNTER — Other Ambulatory Visit: Payer: Self-pay | Admitting: Family Medicine

## 2019-04-02 ENCOUNTER — Other Ambulatory Visit: Payer: Self-pay

## 2019-04-02 ENCOUNTER — Ambulatory Visit (INDEPENDENT_AMBULATORY_CARE_PROVIDER_SITE_OTHER): Payer: Self-pay | Admitting: Family Medicine

## 2019-04-02 ENCOUNTER — Encounter: Payer: Self-pay | Admitting: Family Medicine

## 2019-04-02 DIAGNOSIS — U071 COVID-19: Secondary | ICD-10-CM

## 2019-04-02 DIAGNOSIS — J0110 Acute frontal sinusitis, unspecified: Secondary | ICD-10-CM

## 2019-04-02 MED ORDER — DOXYCYCLINE HYCLATE 100 MG PO TABS: 100.0000 mg | ORAL_TABLET | Freq: Two times a day (BID) | ORAL | 0 refills | Status: AC

## 2019-04-02 MED ORDER — PREDNISONE 20 MG PO TABS: 40.0000 mg | ORAL_TABLET | Freq: Every day | ORAL | 0 refills | Status: AC

## 2019-04-02 NOTE — Progress Notes (Signed)
CC: Covid+  [name] here for URI complaints. Due to COVID-19 pandemic, we are interacting via web portal for an electronic face-to-face visit. I verified patient's ID using 2 identifiers. Patient agreed to proceed with visit via this method. Patient is at home, I am at office. Patient and I are present for visit.   Duration: 12 days; worsening bronchitis symptoms 2-3 days ago Associated symptoms: sinus congestion, sinus pain, rhinorrhea, ear fullness, sore throat, myalgia, diarrhea, nausea, loss of sense of smell/taste, and cough Denies: itchy watery eyes, ear pain, ear drainage, wheezing and shortness of breath Treatment to date: Hycodan Sick contacts: No  ROS:  Const: Denies fevers HEENT: As noted in HPI Lungs: No SOB  Past Medical History:  Diagnosis Date  . Allergic rhinitis   . Allergy   . Anemia 07/17/2016  . Anxiety   . Asthma    seasonal  . Blood transfusion without reported diagnosis   . Breast cancer ([place])    right  . Bronchitis   . Colon polyps   . Depression   . Diabetes mellitus    borderline but takes metformin  . Edema   . Family history of breast cancer in first degree relative   . GERD (gastroesophageal reflux disease)   . Hyperglycemia 03/05/2017  . Hyperlipidemia 08/21/2016   no meds  . Hyperparathyroidism   . Hypertension    controlled by medications  . Neuromuscular disorder (HCC)    Fibromyalgia  . Obesity   . Osteoarthritis    RA  . PONV (postoperative nausea and vomiting)    occasional  . Sleep apnea    wears cpap  . Viral meningitis   . Vitamin D deficiency    Exam No conversational dyspnea Age appropriate judgment and insight Nml affect and mood  COVID-19  Acute frontal sinusitis, recurrence not specified - Plan: doxycycline (VIBRA-TABS) 100 MG tablet, predniSONE (DELTASONE) 20 MG tablet  Orders as above. Pred burst first, if no improvement after 3-4 d, take doxy. She has done well with this in the past.  Continue to push  fluids, practice good hand hygiene, cover mouth when coughing. F/u prn. If starting to experience fevers, shaking, or shortness of breath, seek immediate care. Pt voiced understanding and agreement to the plan.  [place], DO 04/02/19 3:58 PM

## 2019-04-03 ENCOUNTER — Encounter: Payer: Self-pay | Admitting: Family Medicine

## 2019-04-11 ENCOUNTER — Telehealth: Payer: Self-pay | Admitting: Family Medicine

## 2019-04-11 NOTE — Telephone Encounter (Signed)
Please advise 

## 2019-04-11 NOTE — Telephone Encounter (Signed)
Pt stated she was given an antibiotic and prednisone and finished them but she is still having symptoms. She is not coughing as bad but still has a cough, headache and is not fully well. She would like to know if something else can be sent to her pharmacy without a virtual appt first. Pt does not have insurance currently. She will do a virtual if it is absolutely necessary. Please advise.

## 2019-04-13 NOTE — Telephone Encounter (Signed)
The question is does she need more meds. If she is continuing to improve she probably just needs to rest, drink fluids, treat symptoms, Take Mucinex and if she is having trouble sleeping then we can send her in a cough syrup maybel if she is worse I need to know what her symptoms are

## 2019-04-14 ENCOUNTER — Other Ambulatory Visit: Payer: Self-pay | Admitting: Family Medicine

## 2019-04-14 MED ORDER — BENZONATATE 100 MG PO CAPS: 100.0000 mg | ORAL_CAPSULE | Freq: Three times a day (TID) | ORAL | 0 refills | Status: DC | PRN

## 2019-04-15 ENCOUNTER — Other Ambulatory Visit: Payer: Self-pay

## 2019-04-15 DIAGNOSIS — Z20822 Contact with and (suspected) exposure to covid-19: Secondary | ICD-10-CM

## 2019-04-17 LAB — NOVEL CORONAVIRUS, NAA: SARS-CoV-2, NAA: NOT DETECTED

## 2019-04-27 ENCOUNTER — Encounter: Payer: Self-pay | Admitting: Family Medicine

## 2019-04-28 ENCOUNTER — Other Ambulatory Visit: Payer: Self-pay | Admitting: Family Medicine

## 2019-04-28 MED ORDER — PROMETHAZINE-DM 6.25-15 MG/5ML PO SYRP: 5.0000 mL | ORAL_SOLUTION | Freq: Four times a day (QID) | ORAL | 0 refills | Status: DC | PRN

## 2019-06-09 ENCOUNTER — Other Ambulatory Visit: Payer: Self-pay | Admitting: Obstetrics and Gynecology

## 2019-06-09 DIAGNOSIS — Z1231 Encounter for screening mammogram for malignant neoplasm of breast: Secondary | ICD-10-CM

## 2019-06-10 ENCOUNTER — Other Ambulatory Visit: Payer: Self-pay | Admitting: Family Medicine

## 2019-06-30 ENCOUNTER — Encounter: Payer: Self-pay | Admitting: Family Medicine

## 2019-06-30 MED ORDER — TELMISARTAN 20 MG PO TABS: 20.0000 mg | ORAL_TABLET | Freq: Every day | ORAL | 1 refills | Status: DC

## 2019-07-06 ENCOUNTER — Encounter: Payer: Self-pay | Admitting: Family Medicine

## 2019-07-07 DIAGNOSIS — M25561 Pain in right knee: Secondary | ICD-10-CM | POA: Diagnosis not present

## 2019-07-07 MED ORDER — HYDROCHLOROTHIAZIDE 12.5 MG PO CAPS: 12.5000 mg | ORAL_CAPSULE | Freq: Every day | ORAL | 1 refills | Status: DC

## 2019-07-07 MED ORDER — FUROSEMIDE 40 MG PO TABS: 40.0000 mg | ORAL_TABLET | Freq: Two times a day (BID) | ORAL | 1 refills | Status: DC

## 2019-07-07 MED ORDER — ESOMEPRAZOLE MAGNESIUM 40 MG PO CPDR: 40.0000 mg | DELAYED_RELEASE_CAPSULE | Freq: Every day | ORAL | 3 refills | Status: DC

## 2019-07-07 NOTE — Addendum Note (Signed)
Addended byDamita Dunnings D on: 07/07/2019 01:30 PM   Modules accepted: Orders

## 2019-07-09 ENCOUNTER — Encounter: Payer: Self-pay | Admitting: Family Medicine

## 2019-07-21 ENCOUNTER — Encounter (INDEPENDENT_AMBULATORY_CARE_PROVIDER_SITE_OTHER): Payer: Self-pay | Admitting: Bariatrics

## 2019-07-21 ENCOUNTER — Other Ambulatory Visit: Payer: Self-pay

## 2019-07-21 ENCOUNTER — Ambulatory Visit (INDEPENDENT_AMBULATORY_CARE_PROVIDER_SITE_OTHER): Payer: Medicare Other | Admitting: Bariatrics

## 2019-07-21 VITALS — BP 115/72 | HR 86 | Temp 98.6°F | Ht 66.0 in | Wt 301.0 lb

## 2019-07-21 DIAGNOSIS — E559 Vitamin D deficiency, unspecified: Secondary | ICD-10-CM

## 2019-07-21 DIAGNOSIS — E119 Type 2 diabetes mellitus without complications: Secondary | ICD-10-CM | POA: Diagnosis not present

## 2019-07-21 DIAGNOSIS — Z6841 Body Mass Index (BMI) 40.0 and over, adult: Secondary | ICD-10-CM | POA: Diagnosis not present

## 2019-07-21 DIAGNOSIS — G4733 Obstructive sleep apnea (adult) (pediatric): Secondary | ICD-10-CM

## 2019-07-21 DIAGNOSIS — E7849 Other hyperlipidemia: Secondary | ICD-10-CM

## 2019-07-21 DIAGNOSIS — R5383 Other fatigue: Secondary | ICD-10-CM

## 2019-07-21 DIAGNOSIS — I10 Essential (primary) hypertension: Secondary | ICD-10-CM

## 2019-07-21 DIAGNOSIS — R0602 Shortness of breath: Secondary | ICD-10-CM

## 2019-07-21 DIAGNOSIS — Z0289 Encounter for other administrative examinations: Secondary | ICD-10-CM

## 2019-07-21 DIAGNOSIS — Z1331 Encounter for screening for depression: Secondary | ICD-10-CM

## 2019-07-21 NOTE — Progress Notes (Signed)
Chief Complaint:   OBESITY [name] (MR# [id number]) is a 64 y.o. female who presents for evaluation and treatment of obesity and related comorbidities. Current BMI is Body mass index is 48.58 kg/m.Marland Kitchen [name] has been struggling with her weight for many years and has been unsuccessful in either losing weight, maintaining weight loss, or reaching her healthy weight goal.  [name] is currently in the action stage of change and ready to dedicate time achieving and maintaining a healthier weight. [name] is interested in becoming our patient and working on intensive lifestyle modifications including (but not limited to) diet and exercise for weight loss.  [name] is waiting on a right knee replacement (needs BMI to be less than 40). She is seeing the orthopedist. She likes to cook, but states that it is hard to cook for one person. She struggles with portion size.  [name]'s habits were reviewed today and are as follows: her desired weight loss is 61 lbs, she has been heavy most of her life, she started gaining weight after childbirth, her heaviest weight ever was 301 pounds, she craves ice cream, she snacks frequently in the evenings, she skips dinner frequently, she has binge eating behaviors and she struggles with emotional eating.  Depression Screen [name] (modified PHQ-9) score was 12.  Depression screen [name] 2/9 07/21/2019  Decreased Interest 1  Down, Depressed, Hopeless 1  PHQ - 2 Score 2  Altered sleeping 3  Tired, decreased energy 2  Change in appetite 2  Feeling bad or failure about yourself  1  Trouble concentrating 1  Moving slowly or fidgety/restless 0  Suicidal thoughts 1  PHQ-9 Score 12  Difficult doing work/chores Not difficult at all  Some recent data might be hidden   Subjective:   Other fatigue. [name] denies daytime somnolence and admits to waking up still tired. [name] generally gets 5 hours of sleep per night, and states that she does  not sleep well most nights. Snoring is not present with CPAP. Apneic episodes are not present. Epworth Sleepiness Score is 8.   Shortness of breath on exertion. [name] notes increasing shortness of breath with certain activities and seems to be worsening over time with weight gain. She notes getting out of breath sooner with activity than she used to. This has gotten worse recently. [name] denies shortness of breath at rest or orthopnea.  OSA (obstructive sleep apnea). [name] wears CPAP and reports restless sleep.  Vitamin D deficiency. [name] is taking Vitamin D over-the-counter and a multivitamin.  Other hyperlipidemia. [name] has diabetes mellitus. She is on no medications.   Lab Results  Component Value Date   CHOL 182 10/12/2017   HDL 46.40 10/12/2017   LDLCALC 107 (H) 10/12/2017   LDLDIRECT 110.0 08/21/2016   TRIG 144.0 10/12/2017   CHOLHDL 4 10/12/2017   Lab Results  Component Value Date   ALT 21 09/19/2017   AST 20 09/19/2017   ALKPHOS 71 09/19/2017   BILITOT 0.3 09/19/2017   The 10-year ASCVD risk score [name] DC Jr., et al., 2013) is: 10.5%   Values used to calculate the score:     Age: 80 years     Sex: Female     Is Non-Hispanic African American: No     Diabetic: Yes     Tobacco smoker: No     Systolic Blood Pressure: [id number]     Is BP treated: Yes     HDL Cholesterol: 46.4 mg/dL  Total Cholesterol: 182 mg/dL  Type 2 diabetes mellitus without complication, without long-term current use of insulin ([name]). [name] is taking metformin. Random glucose range is between 115 and 120.  Lab Results  Component Value Date   HGBA1C 6.6 (H) 10/12/2017   HGBA1C 6.4 06/05/2017   HGBA1C 6.5 03/05/2017   Lab Results  Component Value Date   LDLCALC 107 (H) 10/12/2017   CREATININE 1.01 09/19/2017   No results found for: INSULIN  Essential hypertension. [name] is taking telmisartan and HCTZ.  BP Readings from Last 3 Encounters:  07/21/19 115/72  01/11/19 (!)  148/91  07/16/18 132/76   Lab Results  Component Value Date   CREATININE 1.01 09/19/2017   CREATININE 0.98 06/05/2017   CREATININE 0.94 03/05/2017   Depression screening. [name] had a moderately positive depression screen with a PHQ-9 score of 12.  Assessment/Plan:   Other fatigue. [name] does feel that her weight is causing her energy to be lower than it should be. Fatigue may be related to obesity, depression or many other causes. Labs will be ordered, and in the meanwhile, [name] will focus on self care including making healthy food choices, increasing physical activity and focusing on stress reduction. EKG 12-Lead, T3, T4, free, TSH ordered.   Shortness of breath on exertion. [name] does feel that she gets out of breath more easily that she used to when she exercises. [name]'s shortness of breath appears to be obesity related and exercise induced. She has agreed to work on weight loss and gradually increase exercise to treat her exercise induced shortness of breath. Will continue to monitor closely. [name] will continue activities and increase exercvise.  OSA (obstructive sleep apnea). Intensive lifestyle modifications are the first line treatment for this issue. We discussed several lifestyle modifications today and she will continue to work on diet, exercise and weight loss efforts. We will continue to monitor. Orders and follow up as documented in patient record. [name] will continue the use of the CPAP.  Counseling  Sleep apnea is a condition in which breathing pauses or becomes shallow during sleep. This happens over and over during the night. This disrupts your sleep and keeps your body from getting the rest that it needs, which can cause tiredness and lack of energy (fatigue) during the day.  Sleep apnea treatment: If you were given a device to open your airway while you sleep, USE IT!  Sleep hygiene:   Limit or avoid alcohol, caffeinated beverages, and cigarettes,  especially close to bedtime.   Do not eat a large meal or eat spicy foods right before bedtime. This can lead to digestive discomfort that can make it hard for you to sleep.  Keep a sleep diary to help you and your health care provider figure out what could be causing your insomnia.  . Make your bedroom a dark, comfortable place where it is easy to fall asleep. ? Put up shades or blackout curtains to block light from outside. ? Use a white noise machine to block noise. ? Keep the temperature cool. . Limit screen use before bedtime. This includes: ? Watching TV. ? Using your smartphone, tablet, or computer. . Stick to a routine that includes going to bed and waking up at the same times every day and night. This can help you fall asleep faster. Consider making a quiet activity, such as reading, part of your nighttime routine. . Try to avoid taking naps during the day so that you sleep better at night. . Get out  of bed if you are still awake after 15 minutes of trying to sleep. Keep the lights down, but try reading or doing a quiet activity. When you feel sleepy, go back to bed.  Vitamin D deficiency. Low Vitamin D level contributes to fatigue and are associated with obesity, breast, and colon cancer. She agrees to continue to take prescription Vitamin D @50 ,000 IU every week and will follow-up for routine testing of Vitamin D, at least 2-3 times per year to avoid over-replacement. VITAMIN D 25 Hydroxy (Vit-D Deficiency, Fractures) level ordered.  Other hyperlipidemia. Cardiovascular risk and specific lipid/LDL goals reviewed.  We discussed several lifestyle modifications today and Dafina will continue to work on diet, exercise and weight loss efforts. Orders and follow up as documented in patient record. Lipid Panel With LDL/HDL Ratio labs ordered.  Counseling Intensive lifestyle modifications are the first line treatment for this issue. . Dietary changes: Increase soluble fiber. Decrease simple  carbohydrates. . Exercise changes: Moderate to vigorous-intensity aerobic activity 150 minutes per week if tolerated. . Lipid-lowering medications: see documented in medical record.   Type 2 diabetes mellitus without complication, without long-term current use of insulin ([name]). Good blood sugar control is important to decrease the likelihood of diabetic complications such as nephropathy, neuropathy, limb loss, blindness, coronary artery disease, and death. Intensive lifestyle modification including diet, exercise and weight loss are the first line of treatment for diabetes. Emillia will continue metformin. Comprehensive metabolic panel, Hemoglobin A1c, Insulin, random labs ordered.  Essential hypertension. [name] is working on healthy weight loss and exercise to improve blood pressure control. We will watch for signs of hypotension as she continues her lifestyle modifications. She will continue her medications as directed.  Depression screening. Auriel had a positive depression screening. Depression is commonly associated with obesity and often results in emotional eating behaviors. We will monitor this closely and work on CBT to help improve the non-hunger eating patterns. Referral to Psychology may be required if no improvement is seen as she continues in our clinic.  Class 3 severe obesity with serious comorbidity and body mass index (BMI) of 45.0 to 49.9 in adult, unspecified obesity type ([name]).  [name] is currently in the action stage of change and her goal is to continue with weight loss efforts. I recommend [name] begin the structured treatment plan as follows:  She has agreed to the Category 4 Plan.  She will work on meal planning.   We independently reviewed labs from 10/12/2017 with the patient including lipids, A1c, and TSH. No recent labs on chart.  Exercise goals: [name] will join the gym and will start going on a regular basis.   Behavioral modification strategies: increasing  lean protein intake, decreasing simple carbohydrates, increasing vegetables, increasing water intake, decreasing eating out, no skipping meals, meal planning and cooking strategies, keeping healthy foods in the home and planning for success.  She was informed of the importance of frequent follow-up visits to maximize her success with intensive lifestyle modifications for her multiple health conditions. She was informed we would discuss her lab results at her next visit unless there is a critical issue that needs to be addressed sooner. Penina agreed to keep her next visit at the agreed upon time to discuss these results.  Objective:   Blood pressure 115/72, pulse 86, temperature 98.6 F (37 C), height 5\' 6"  (1.676 m), weight (!) 301 lb (136.5 kg), SpO2 98 %. Body mass index is 48.58 kg/m.  EKG: Normal sinus rhythm, rate 91 BPM. Within  normal limits.  Indirect Calorimeter completed today shows a VO2 of 367 and a REE of 2555.  Her calculated basal metabolic rate is [id number] thus her basal metabolic rate is better than expected.  General: Cooperative, alert, well developed, in no acute distress. HEENT: Conjunctivae and lids unremarkable. Cardiovascular: Regular rhythm.  Lungs: Normal work of breathing. Neurologic: No focal deficits.   Lab Results  Component Value Date   CREATININE 1.01 09/19/2017   BUN 20 09/19/2017   NA 138 09/19/2017   K 4.3 09/19/2017   CL 97 09/19/2017   CO2 33 (H) 09/19/2017   Lab Results  Component Value Date   ALT 21 09/19/2017   AST 20 09/19/2017   ALKPHOS 71 09/19/2017   BILITOT 0.3 09/19/2017   Lab Results  Component Value Date   HGBA1C 6.6 (H) 10/12/2017   HGBA1C 6.4 06/05/2017   HGBA1C 6.5 03/05/2017   HGBA1C 6.6 (H) 12/01/2016   HGBA1C 6.1 11/18/2015   No results found for: INSULIN Lab Results  Component Value Date   TSH 0.88 10/12/2017   Lab Results  Component Value Date   CHOL 182 10/12/2017   HDL 46.40 10/12/2017   LDLCALC 107 (H)  10/12/2017   LDLDIRECT 110.0 08/21/2016   TRIG 144.0 10/12/2017   CHOLHDL 4 10/12/2017   Lab Results  Component Value Date   WBC 9.3 09/19/2017   HGB 12.1 09/19/2017   HCT 36.6 09/19/2017   MCV 86.5 09/19/2017   PLT 260.0 09/19/2017   No results found for: IRON, TIBC, FERRITIN  Obesity Behavioral Intervention Visit Documentation for Insurance:   Approximately 15 minutes were spent on the discussion below.  ASK: We discussed the diagnosis of obesity with Leira today and Jennavie agreed to give [name] permission to discuss obesity behavioral modification therapy today.  ASSESS: Chiquita has the diagnosis of obesity and her BMI today is 48.7. Edwena is in the action stage of change.   ADVISE: Yatzary was educated on the multiple health risks of obesity as well as the benefit of weight loss to improve her health. She was advised of the need for long term treatment and the importance of lifestyle modifications to improve her current health and to decrease her risk of future health problems.  AGREE: Multiple dietary modification options and treatment options were discussed and Jolynn agreed to follow the recommendations documented in the above note.  ARRANGE: Aprel was educated on the importance of frequent visits to treat obesity as outlined per CMS and USPSTF guidelines and agreed to schedule her next follow up appointment today.  Attestation Statements:   Reviewed by clinician on day of visit: allergies, medications, problem list, medical history, surgical history, family history, social history, and previous encounter notes.  [name], am acting as Location manager for [employer], DO   I have reviewed the above documentation for accuracy and completeness, and I agree with the above. [name], DO

## 2019-07-22 ENCOUNTER — Encounter (INDEPENDENT_AMBULATORY_CARE_PROVIDER_SITE_OTHER): Payer: Self-pay | Admitting: Bariatrics

## 2019-07-22 ENCOUNTER — Ambulatory Visit
Admission: RE | Admit: 2019-07-22 | Discharge: 2019-07-22 | Disposition: A | Discharge status: Home / Self Care | Payer: Medicare Other | Source: Ambulatory Visit | Attending: Obstetrics and Gynecology | Admitting: Obstetrics and Gynecology

## 2019-07-22 DIAGNOSIS — Z1231 Encounter for screening mammogram for malignant neoplasm of breast: Secondary | ICD-10-CM | POA: Diagnosis not present

## 2019-07-22 LAB — COMPREHENSIVE METABOLIC PANEL
ALT: 29 IU/L (ref 0–32)
AST: 28 IU/L (ref 0–40)
Albumin/Globulin Ratio: 1.6 (ref 1.2–2.2)
Albumin: 4.4 g/dL (ref 3.8–4.8)
Alkaline Phosphatase: 68 IU/L (ref 39–117)
BUN/Creatinine Ratio: 17 (ref 12–28)
BUN: 15 mg/dL (ref 8–27)
Bilirubin Total: 0.3 mg/dL (ref 0.0–1.2)
CO2: 26 mmol/L (ref 20–29)
Calcium: 9.7 mg/dL (ref 8.7–10.3)
Chloride: 100 mmol/L (ref 96–106)
Creatinine, Ser: 0.88 mg/dL (ref 0.57–1.00)
GFR calc Af Amer: 80 mL/min/{1.73_m2} (ref >59)
GFR calc non Af Amer: 70 mL/min/{1.73_m2} (ref >59)
Globulin, Total: 2.7 g/dL (ref 1.5–4.5)
Glucose: 115 mg/dL — ABNORMAL HIGH (ref 65–99)
Potassium: 4.2 mmol/L (ref 3.5–5.2)
Sodium: 143 mmol/L (ref 134–144)
Total Protein: 7.1 g/dL (ref 6.0–8.5)

## 2019-07-22 LAB — T3: T3, Total: 143 ng/dL (ref 71–180)

## 2019-07-22 LAB — LIPID PANEL WITH LDL/HDL RATIO
Cholesterol, Total: 180 mg/dL (ref 100–199)
HDL: 50 mg/dL (ref >39)
LDL Chol Calc (NIH): 97 mg/dL (ref 0–99)
LDL/HDL Ratio: 1.9 ratio (ref 0.0–3.2)
Triglycerides: 193 mg/dL — ABNORMAL HIGH (ref 0–149)
VLDL Cholesterol Cal: 33 mg/dL (ref 5–40)

## 2019-07-22 LAB — HEMOGLOBIN A1C
Est. average glucose Bld gHb Est-mCnc: 137 mg/dL
Hgb A1c MFr Bld: 6.4 % — ABNORMAL HIGH (ref 4.8–5.6)

## 2019-07-22 LAB — T4, FREE: Free T4: 0.99 ng/dL (ref 0.82–1.77)

## 2019-07-22 LAB — TSH: TSH: 0.807 u[IU]/mL (ref 0.450–4.500)

## 2019-07-22 LAB — VITAMIN D 25 HYDROXY (VIT D DEFICIENCY, FRACTURES): Vit D, 25-Hydroxy: 38.3 ng/mL (ref 30.0–100.0)

## 2019-07-22 LAB — INSULIN, RANDOM: INSULIN: 35.9 u[IU]/mL — ABNORMAL HIGH (ref 2.6–24.9)

## 2019-07-22 NOTE — Telephone Encounter (Signed)
Please advise 

## 2019-07-23 ENCOUNTER — Other Ambulatory Visit: Payer: Self-pay

## 2019-07-23 NOTE — Telephone Encounter (Signed)
Please advise 

## 2019-07-24 ENCOUNTER — Ambulatory Visit: Payer: Self-pay | Admitting: Family Medicine

## 2019-07-25 DIAGNOSIS — E119 Type 2 diabetes mellitus without complications: Secondary | ICD-10-CM | POA: Diagnosis not present

## 2019-07-30 DIAGNOSIS — Z124 Encounter for screening for malignant neoplasm of cervix: Secondary | ICD-10-CM | POA: Diagnosis not present

## 2019-08-01 LAB — HM PAP SMEAR

## 2019-08-04 ENCOUNTER — Encounter (INDEPENDENT_AMBULATORY_CARE_PROVIDER_SITE_OTHER): Payer: Self-pay | Admitting: Bariatrics

## 2019-08-04 ENCOUNTER — Ambulatory Visit (INDEPENDENT_AMBULATORY_CARE_PROVIDER_SITE_OTHER): Payer: Medicare Other | Admitting: Bariatrics

## 2019-08-04 ENCOUNTER — Other Ambulatory Visit: Payer: Self-pay

## 2019-08-04 ENCOUNTER — Encounter: Payer: Self-pay | Admitting: Family Medicine

## 2019-08-04 VITALS — BP 120/83 | HR 94 | Temp 98.7°F | Ht 66.0 in | Wt 298.0 lb

## 2019-08-04 DIAGNOSIS — Z6841 Body Mass Index (BMI) 40.0 and over, adult: Secondary | ICD-10-CM

## 2019-08-04 DIAGNOSIS — R7303 Prediabetes: Secondary | ICD-10-CM | POA: Diagnosis not present

## 2019-08-04 DIAGNOSIS — G4733 Obstructive sleep apnea (adult) (pediatric): Secondary | ICD-10-CM

## 2019-08-04 DIAGNOSIS — E559 Vitamin D deficiency, unspecified: Secondary | ICD-10-CM

## 2019-08-04 MED ORDER — VITAMIN D (ERGOCALCIFEROL) 1.25 MG (50000 UNIT) PO CAPS: 50000.0000 [IU] | ORAL_CAPSULE | ORAL | 0 refills | Status: DC

## 2019-08-04 NOTE — Progress Notes (Signed)
Chief Complaint:   OBESITY [name] is here to discuss her progress with her obesity treatment plan along with follow-up of her obesity related diagnoses. [name] is on the Category 4 Plan and states she is following her eating plan approximately 90% of the time. [name] states she is doing water aerobics 60 minutes 1 time per week.  Today's visit was #: 2 Starting weight: 301 lbs Starting date: 07/21/2019 Today's weight: 298 lbs Today's date: 08/04/2019 Total lbs lost to date: 3 Total lbs lost since last in-office visit: 3  Interim History: [name] is down 3 lbs. She states that she could not eat all the meat, so she substituted with protein cereal.  Subjective:   Vitamin D deficiency. [name] is taking Vitamin D OTC. Last Vitamin D 38.3 on 07/21/2019.  Prediabetes. [name] has a diagnosis of prediabetes based on her elevated HgA1c and was informed this puts her at greater risk of developing diabetes. She continues to work on diet and exercise to decrease her risk of diabetes. She denies nausea or hypoglycemia. [name] reports decreased appetite.  Lab Results  Component Value Date   HGBA1C 6.4 (H) 07/21/2019   Lab Results  Component Value Date   INSULIN 35.9 (H) 07/21/2019   OSA (obstructive sleep apnea). [name] reports insomnia.  Assessment/Plan:   Vitamin D deficiency. Low Vitamin D level contributes to fatigue and are associated with obesity, breast, and colon cancer. She was given a prescription for Vitamin D, Ergocalciferol, (DRISDOL) 1.25 MG (50000 UNIT) CAPS capsule every week #4 with 0 refills and will follow-up for routine testing of Vitamin D, at least 2-3 times per year to avoid over-replacement.    Prediabetes. [name] will continue to work on weight loss, decrease exercise, increasing healthy fats and protein, and decreasing simple carbohydrates to help decrease the risk of diabetes. She was given handout on Insulin Resistance and  Prediabetes.  OSA (obstructive sleep apnea). Intensive lifestyle modifications are the first line treatment for this issue. We discussed several lifestyle modifications today and she will continue to work on diet, exercise and weight loss efforts. We will continue to monitor. Orders and follow up as documented in patient record. [name] will wear her CPAP nightly. She will take melatonin 5 mg at night.  Counseling  Sleep apnea is a condition in which breathing pauses or becomes shallow during sleep. This happens over and over during the night. This disrupts your sleep and keeps your body from getting the rest that it needs, which can cause tiredness and lack of energy (fatigue) during the day.  Sleep apnea treatment: If you were given a device to open your airway while you sleep, USE IT!  Sleep hygiene:   Limit or avoid alcohol, caffeinated beverages, and cigarettes, especially close to bedtime.   Do not eat a large meal or eat spicy foods right before bedtime. This can lead to digestive discomfort that can make it hard for you to sleep.  Keep a sleep diary to help you and your health care provider figure out what could be causing your insomnia.  . Make your bedroom a dark, comfortable place where it is easy to fall asleep. ? Put up shades or blackout curtains to block light from outside. ? Use a white noise machine to block noise. ? Keep the temperature cool. . Limit screen use before bedtime. This includes: ? Watching TV. ? Using your smartphone, tablet, or computer. . Stick to a routine that includes going to bed and  waking up at the same times every day and night. This can help you fall asleep faster. Consider making a quiet activity, such as reading, part of your nighttime routine. . Try to avoid taking naps during the day so that you sleep better at night. . Get out of bed if you are still awake after 15 minutes of trying to sleep. Keep the lights down, but try reading or doing a  quiet activity. When you feel sleepy, go back to bed.  Class 3 severe obesity with serious comorbidity and body mass index (BMI) of 45.0 to 49.9 in adult, unspecified obesity type ([place]).  [name] is currently in the action stage of change. As such, her goal is to continue with weight loss efforts. She has agreed to the Category 4 Plan.   We reviewed labs from 07/21/2019 with the patient including CMP, lipids, Vitamin D, A1c, insulin, and thyroid panel.  She will work on meal planning and increasing her water intake to 64 ounces daily.  Exercise goals: [name] will exercise more. She will be swimming every Monday.  Behavioral modification strategies: increasing lean protein intake, decreasing simple carbohydrates, increasing vegetables, increasing water intake, decreasing eating out, no skipping meals, meal planning and cooking strategies and keeping healthy foods in the home.  [name] has agreed to follow-up with our clinic in 2 weeks. She was informed of the importance of frequent follow-up visits to maximize her success with intensive lifestyle modifications for her multiple health conditions.   Objective:   Blood pressure 120/83, pulse 94, temperature 98.7 F (37.1 C), height 5\' 6"  (1.676 m), weight 298 lb (135.2 kg), SpO2 95 %. Body mass index is 48.1 kg/m.  General: Cooperative, alert, well developed, in no acute distress. HEENT: Conjunctivae and lids unremarkable. Cardiovascular: Regular rhythm.  Lungs: Normal work of breathing. Neurologic: No focal deficits.   Lab Results  Component Value Date   CREATININE 0.88 07/21/2019   BUN 15 07/21/2019   NA 143 07/21/2019   K 4.2 07/21/2019   CL 100 07/21/2019   CO2 26 07/21/2019   Lab Results  Component Value Date   ALT 29 07/21/2019   AST 28 07/21/2019   ALKPHOS 68 07/21/2019   BILITOT 0.3 07/21/2019   Lab Results  Component Value Date   HGBA1C 6.4 (H) 07/21/2019   HGBA1C 6.6 (H) 10/12/2017   HGBA1C 6.4 06/05/2017    HGBA1C 6.5 03/05/2017   HGBA1C 6.6 (H) 12/01/2016   Lab Results  Component Value Date   INSULIN 35.9 (H) 07/21/2019   Lab Results  Component Value Date   TSH 0.807 07/21/2019   Lab Results  Component Value Date   CHOL 180 07/21/2019   HDL 50 07/21/2019   LDLCALC 97 07/21/2019   LDLDIRECT 110.0 08/21/2016   TRIG 193 (H) 07/21/2019   CHOLHDL 4 10/12/2017   Lab Results  Component Value Date   WBC 9.3 09/19/2017   HGB 12.1 09/19/2017   HCT 36.6 09/19/2017   MCV 86.5 09/19/2017   PLT 260.0 09/19/2017   No results found for: IRON, TIBC, FERRITIN  Obesity Behavioral Intervention Documentation for Insurance:   Approximately 15 minutes were spent on the discussion below.  ASK: We discussed the diagnosis of obesity with [name] today and [name] agreed to give [name] permission to discuss obesity behavioral modification therapy today.  ASSESS: [name] has the diagnosis of obesity and her BMI today is 48.2. [name] is in the action stage of change.   ADVISE: [name] was educated on the  multiple health risks of obesity as well as the benefit of weight loss to improve her health. She was advised of the need for long term treatment and the importance of lifestyle modifications to improve her current health and to decrease her risk of future health problems.  AGREE: Multiple dietary modification options and treatment options were discussed and [name] agreed to follow the recommendations documented in the above note.  ARRANGE: [name] was educated on the importance of frequent visits to treat obesity as outlined per CMS and USPSTF guidelines and agreed to schedule her next follow up appointment today.  Attestation Statements:   Reviewed by clinician on day of visit: allergies, medications, problem list, medical history, surgical history, family history, social history, and previous encounter notes.  [name], am acting as Location manager for [employer], DO   I have  reviewed the above documentation for accuracy and completeness, and I agree with the above. [name], DO

## 2019-08-05 ENCOUNTER — Encounter (INDEPENDENT_AMBULATORY_CARE_PROVIDER_SITE_OTHER): Payer: Self-pay | Admitting: Bariatrics

## 2019-08-06 ENCOUNTER — Encounter: Payer: Self-pay | Admitting: Family Medicine

## 2019-08-07 MED ORDER — METFORMIN HCL 500 MG PO TABS: 500.0000 mg | ORAL_TABLET | Freq: Two times a day (BID) | ORAL | 1 refills | Status: DC

## 2019-08-09 ENCOUNTER — Ambulatory Visit: Payer: Medicare Other

## 2019-08-11 ENCOUNTER — Ambulatory Visit: Payer: Medicare Other | Attending: Internal Medicine

## 2019-08-11 DIAGNOSIS — Z23 Encounter for immunization: Secondary | ICD-10-CM

## 2019-08-11 NOTE — Progress Notes (Signed)
   Covid-19 Vaccination Clinic  Name:  [name]    MRN: [id number] DOB: 18-Apr-1956  08/11/2019  [name] was observed post Covid-19 immunization for 15 minutes without incident. She was provided with Vaccine Information Sheet and instruction to access the V-Safe system.   [name] was instructed to call 911 with any severe reactions post vaccine: Marland Kitchen Difficulty breathing  . Swelling of face and throat  . A fast heartbeat  . A bad rash all over body  . Dizziness and weakness   Immunizations Administered    Name Date Dose VIS Date Route   Pfizer COVID-19 Vaccine 08/11/2019  2:00 PM 0.3 mL 05/09/2019 Intramuscular   Manufacturer: [place]   Lot: [id number]   [place]: [id number]

## 2019-08-18 ENCOUNTER — Ambulatory Visit: Payer: Self-pay | Admitting: Family Medicine

## 2019-08-19 ENCOUNTER — Ambulatory Visit (INDEPENDENT_AMBULATORY_CARE_PROVIDER_SITE_OTHER): Payer: Medicare Other | Admitting: Medical

## 2019-08-19 ENCOUNTER — Other Ambulatory Visit: Payer: Self-pay

## 2019-08-19 DIAGNOSIS — J3489 Other specified disorders of nose and nasal sinuses: Secondary | ICD-10-CM | POA: Diagnosis not present

## 2019-08-19 DIAGNOSIS — R05 Cough: Secondary | ICD-10-CM

## 2019-08-19 DIAGNOSIS — J309 Allergic rhinitis, unspecified: Secondary | ICD-10-CM | POA: Diagnosis not present

## 2019-08-19 DIAGNOSIS — R062 Wheezing: Secondary | ICD-10-CM | POA: Diagnosis not present

## 2019-08-19 DIAGNOSIS — R059 Cough, unspecified: Secondary | ICD-10-CM

## 2019-08-19 MED ORDER — LEVOCETIRIZINE DIHYDROCHLORIDE 5 MG PO TABS: 5.0000 mg | ORAL_TABLET | Freq: Every evening | ORAL | 3 refills | Status: DC

## 2019-08-19 MED ORDER — ALBUTEROL SULFATE HFA 108 (90 BASE) MCG/ACT IN AERS: 2.0000 | INHALATION_SPRAY | Freq: Four times a day (QID) | RESPIRATORY_TRACT | 0 refills | Status: DC | PRN

## 2019-08-19 MED ORDER — BENZONATATE 100 MG PO CAPS: 100.0000 mg | ORAL_CAPSULE | Freq: Three times a day (TID) | ORAL | 0 refills | Status: DC | PRN

## 2019-08-19 MED ORDER — DOXYCYCLINE HYCLATE 100 MG PO TABS: 100.0000 mg | ORAL_TABLET | Freq: Two times a day (BID) | ORAL | 0 refills | Status: DC

## 2019-08-19 MED ORDER — FLUTICASONE PROPIONATE 50 MCG/ACT NA SUSP: 2.0000 | Freq: Every day | NASAL | 2 refills | Status: DC

## 2019-08-19 MED ORDER — MONTELUKAST SODIUM 10 MG PO TABS: 10.0000 mg | ORAL_TABLET | Freq: Every day | ORAL | 3 refills | Status: DC

## 2019-08-19 NOTE — Patient Instructions (Signed)
Recent signs/symptoms likely allergic rhinitis with some early concern sinus infection and bronchitis.  Recommend start xyzal, montelukast and flonase.  Benzonatate for cough.   If sinus pressure worsens or chest congestion worsens then start doxycycline antibiotic. Not to start today. Pt expresses understanding.   Albuterol inhaler to use if needed for wheezing.  If any constant wheezing despite the above then consider taper prednisone.  Pt had covid in November and recent vaccine one week ago so thinks covid unlikely. But in event signs/symptoms worsen or change notify us. 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  Follow up 7-10 days or as needed

## 2019-08-19 NOTE — Progress Notes (Signed)
   Subjective:    Patient ID: [name], female    DOB: Oct 27, 1955, 64 y.o.   MRN: [id number]  HPI  Virtual Visit via Video Note  I connected with [name] on 08/19/19 at  1:20 PM EDT by a video enabled telemedicine application and verified that I am speaking with the correct person using two identifiers. Pt did not check vitals today.  Location: Patient: work Provider: office   I discussed the limitations of evaluation and management by telemedicine and the availability of in person appointments. The patient expressed understanding and agreed to proceed.  Pt did not check bp or pulse today.  History of Present Illness: Pt states just recently she got nasal congestion, pnd and some productive cough. Symptoms started on Sunday but worse past 2 days. Thinks faint transient occasional wheeze at night. Pt states usually in spring will get allergies.    No fever, no chills, no sweats, no body aches and no loss of smell recently.(but does not last week for 2 days last week some symptoms body aches , fever, chills and lymph node under arm swelled on side of covid vaccine). Those symptoms have resolved.  Pt does have sinus pressure.   Observations/Objective:  General-no acute distress, pleasant, oriented. Lungs- on inspection lungs appear unlabored. Neck- no tracheal deviation or jvd on inspection. Neuro- gross motor function appears intact. heent- mild sinus pressure on self palpatin.   Assessment and Plan: Recent signs/symptoms likely allergic rhinitis with some early concern sinus infection and bronchitis.  Recommend start xyzal, montelukast and flonase.  Benzonatate for cough.   If sinus pressure worsens or chest congestion worsens then start doxycycline antibiotic. Not to start today. Pt expresses understanding.   Albuterol inhaler to use if needed for wheezing.  If any constant wheezing despite the above then consider taper prednisone.  Pt had  covid in November and recent vaccine one week ago so thinks covid unlikely. But in event signs/symptoms worsen or change notify us. 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  Follow up 7-10 days or as needed  Time spent on chart review and with patient discussing  allergic rhitnis , sinus pressure, bronchitis concerns,  wheezing, covid history of infection, and recent vaccine for covid.  Treatment plans, follow up plan and documentation totaled 30  minutes.  Follow Up Instructions:    I discussed the assessment and treatment plan with the patient. The patient was provided an opportunity to ask questions and all were answered. The patient agreed with the plan and demonstrated an understanding of the instructions.   The patient was advised to call back or seek an in-person evaluation if the symptoms worsen or if the condition fails to improve as anticipated.  I provided 25  minutes of non-face-to-face time during this encounter.   Mackie Pai, PA-C   Review of Systems     Objective:   Physical Exam        Assessment & Plan:

## 2019-08-21 ENCOUNTER — Other Ambulatory Visit: Payer: Self-pay

## 2019-08-21 ENCOUNTER — Encounter (INDEPENDENT_AMBULATORY_CARE_PROVIDER_SITE_OTHER): Payer: Self-pay | Admitting: Bariatrics

## 2019-08-21 ENCOUNTER — Ambulatory Visit (INDEPENDENT_AMBULATORY_CARE_PROVIDER_SITE_OTHER): Payer: Medicare Other | Admitting: Bariatrics

## 2019-08-21 VITALS — BP 121/58 | HR 89 | Temp 98.7°F | Ht 66.0 in | Wt 289.0 lb

## 2019-08-21 DIAGNOSIS — E119 Type 2 diabetes mellitus without complications: Secondary | ICD-10-CM | POA: Diagnosis not present

## 2019-08-21 DIAGNOSIS — Z6841 Body Mass Index (BMI) 40.0 and over, adult: Secondary | ICD-10-CM | POA: Diagnosis not present

## 2019-08-21 DIAGNOSIS — E559 Vitamin D deficiency, unspecified: Secondary | ICD-10-CM

## 2019-08-21 MED ORDER — VITAMIN D (ERGOCALCIFEROL) 1.25 MG (50000 UNIT) PO CAPS: 50000.0000 [IU] | ORAL_CAPSULE | ORAL | 0 refills | Status: DC

## 2019-08-21 NOTE — Progress Notes (Signed)
Chief Complaint:   OBESITY [name] is here to discuss her progress with her obesity treatment plan along with follow-up of her obesity related diagnoses. [name] is on the Category 4 Plan and states she is following her eating plan approximately 75% of the time. [name] states she is swimming 45 minutes 1 time per week.  Today's visit was #: 3 Starting weight: 301 lbs Starting date: 07/21/2019 Today's weight: 289 lbs Today's date: 08/21/2019 Total lbs lost to date: 12 Total lbs lost since last in-office visit: 9  Interim History: [name] is down 9 lbs since her last visit. She hates deli meat and is getting tired of eggs. She states she is not drinking sodas.  Subjective:   Type 2 diabetes mellitus without complication, without long-term current use of insulin ([place]). [name] is taking Glucophage and is well controlled.  Lab Results  Component Value Date   HGBA1C 6.4 (H) 07/21/2019   HGBA1C 6.6 (H) 10/12/2017   HGBA1C 6.4 06/05/2017   Lab Results  Component Value Date   LDLCALC 97 07/21/2019   CREATININE 0.88 07/21/2019   Lab Results  Component Value Date   INSULIN 35.9 (H) 07/21/2019   Vitamin D deficiency. No nausea, vomiting, or muscle weakness. Last Vitamin D 38.3 on 07/21/2019.  Assessment/Plan:   Type 2 diabetes mellitus without complication, without long-term current use of insulin ([place]). Good blood sugar control is important to decrease the likelihood of diabetic complications such as nephropathy, neuropathy, limb loss, blindness, coronary artery disease, and death. Intensive lifestyle modification including diet, exercise and weight loss are the first line of treatment for diabetes. [name] will continue her medication as directed and will decrease carbohydrates.  Vitamin D deficiency. Low Vitamin D level contributes to fatigue and are associated with obesity, breast, and colon cancer. She was given a prescription for Vitamin D, Ergocalciferol,  (DRISDOL) 1.25 MG (50000 UNIT) CAPS capsule every week #4 with 0 refills and will follow-up for routine testing of Vitamin D, at least 2-3 times per year to avoid over-replacement.    Class 3 severe obesity with serious comorbidity and body mass index (BMI) of 45.0 to 49.9 in adult, unspecified obesity type ([place]).  [name] is not currently in the action stage of change. As such, her goal is to continue with weight loss efforts. She has agreed to the Category 4 Plan.   She will work on meal planning and intentional eating. Handouts were given on Lunch Ideas and Protein Equivalents.  Exercise goals: [name] will continue swimming and jogging in the pool 45 minutes 1 time per week.  Behavioral modification strategies: increasing lean protein intake, decreasing simple carbohydrates, increasing vegetables, increasing water intake, decreasing eating out, no skipping meals, meal planning and cooking strategies, keeping healthy foods in the home and planning for success.  [name] has agreed to follow-up with our clinic in 2 weeks. She was informed of the importance of frequent follow-up visits to maximize her success with intensive lifestyle modifications for her multiple health conditions.   Objective:   Blood pressure (!) 121/58, pulse 89, temperature 98.7 F (37.1 C), height 5\' 6"  (1.676 m), weight 289 lb (131.1 kg), SpO2 95 %. Body mass index is 46.65 kg/m.  General: Cooperative, alert, well developed, in no acute distress. HEENT: Conjunctivae and lids unremarkable. Cardiovascular: Regular rhythm.  Lungs: Normal work of breathing. Neurologic: No focal deficits.   Lab Results  Component Value Date   CREATININE 0.88 07/21/2019   BUN 15 07/21/2019  NA 143 07/21/2019   K 4.2 07/21/2019   CL 100 07/21/2019   CO2 26 07/21/2019   Lab Results  Component Value Date   ALT 29 07/21/2019   AST 28 07/21/2019   ALKPHOS 68 07/21/2019   BILITOT 0.3 07/21/2019   Lab Results  Component Value  Date   HGBA1C 6.4 (H) 07/21/2019   HGBA1C 6.6 (H) 10/12/2017   HGBA1C 6.4 06/05/2017   HGBA1C 6.5 03/05/2017   HGBA1C 6.6 (H) 12/01/2016   Lab Results  Component Value Date   INSULIN 35.9 (H) 07/21/2019   Lab Results  Component Value Date   TSH 0.807 07/21/2019   Lab Results  Component Value Date   CHOL 180 07/21/2019   HDL 50 07/21/2019   LDLCALC 97 07/21/2019   LDLDIRECT 110.0 08/21/2016   TRIG 193 (H) 07/21/2019   CHOLHDL 4 10/12/2017   Lab Results  Component Value Date   WBC 9.3 09/19/2017   HGB 12.1 09/19/2017   HCT 36.6 09/19/2017   MCV 86.5 09/19/2017   PLT 260.0 09/19/2017   No results found for: IRON, TIBC, FERRITIN  Obesity Behavioral Intervention Documentation for Insurance:   Approximately 15 minutes were spent on the discussion below.  ASK: We discussed the diagnosis of obesity with [name] today and [name] agreed to give [name] permission to discuss obesity behavioral modification therapy today.  ASSESS: [name] has the diagnosis of obesity and her BMI today is 46.7. [name] is in the action stage of change.   ADVISE: [name] was educated on the multiple health risks of obesity as well as the benefit of weight loss to improve her health. She was advised of the need for long term treatment and the importance of lifestyle modifications to improve her current health and to decrease her risk of future health problems.  AGREE: Multiple dietary modification options and treatment options were discussed and [name] agreed to follow the recommendations documented in the above note.  ARRANGE: [name] was educated on the importance of frequent visits to treat obesity as outlined per CMS and USPSTF guidelines and agreed to schedule her next follow up appointment today.  Attestation Statements:   Reviewed by clinician on day of visit: allergies, medications, problem list, medical history, surgical history, family history, social history, and previous encounter  notes.  [name], am acting as Location manager for [employer], DO   I have reviewed the above documentation for accuracy and completeness, and I agree with the above. Holly Lesch, DO

## 2019-09-01 ENCOUNTER — Encounter: Payer: Self-pay | Admitting: Family Medicine

## 2019-09-03 ENCOUNTER — Ambulatory Visit: Payer: Medicare Other

## 2019-09-08 ENCOUNTER — Encounter (INDEPENDENT_AMBULATORY_CARE_PROVIDER_SITE_OTHER): Payer: Self-pay | Admitting: Bariatrics

## 2019-09-08 ENCOUNTER — Other Ambulatory Visit: Payer: Self-pay

## 2019-09-08 ENCOUNTER — Ambulatory Visit (INDEPENDENT_AMBULATORY_CARE_PROVIDER_SITE_OTHER): Payer: Medicare Other | Admitting: Bariatrics

## 2019-09-08 VITALS — BP 119/81 | HR 83 | Temp 98.1°F | Ht 66.0 in | Wt 285.0 lb

## 2019-09-08 DIAGNOSIS — E119 Type 2 diabetes mellitus without complications: Secondary | ICD-10-CM

## 2019-09-08 DIAGNOSIS — E1159 Type 2 diabetes mellitus with other circulatory complications: Secondary | ICD-10-CM

## 2019-09-08 DIAGNOSIS — I10 Essential (primary) hypertension: Secondary | ICD-10-CM | POA: Diagnosis not present

## 2019-09-08 DIAGNOSIS — I152 Hypertension secondary to endocrine disorders: Secondary | ICD-10-CM

## 2019-09-08 DIAGNOSIS — Z6841 Body Mass Index (BMI) 40.0 and over, adult: Secondary | ICD-10-CM

## 2019-09-08 NOTE — Progress Notes (Signed)
Chief Complaint:   OBESITY [name] is here to discuss her progress with her obesity treatment plan along with follow-up of her obesity related diagnoses. [name] is on the Category 4 Plan and states she is following her eating plan approximately 90% of the time. [name] states she is swimming 45 minutes 1 time per week.  Today's visit was #: 4 Starting weight: 301 lbs Starting date: 07/21/2019 Today's weight: 285 lbs Today's date: 09/08/2019 Total lbs lost to date: 16 Total lbs lost since last in-office visit: 4  Interim History: [name] has been steadily increasing her water intake and now craves water and has dramatically decreased her soda intake.  Subjective:   Type 2 diabetes mellitus without complication, without long-term current use of insulin ([place]). [name] is currently on metformin 500 mg BID and tolerating it well.   Lab Results  Component Value Date   HGBA1C 6.4 (H) 07/21/2019   HGBA1C 6.6 (H) 10/12/2017   HGBA1C 6.4 06/05/2017   Lab Results  Component Value Date   LDLCALC 97 07/21/2019   CREATININE 0.88 07/21/2019   Lab Results  Component Value Date   INSULIN 35.9 (H) 07/21/2019   Hypertension associated with type 2 diabetes mellitus ([place]). Blood pressure is well controlled at her office visit today. She is currently on telmisartan 20 mg daily, HCTZ 12.5 mg daily, and furosemide 40 mg BID.  BP Readings from Last 3 Encounters:  09/08/19 119/81  08/21/19 (!) 121/58  08/04/19 120/83   Lab Results  Component Value Date   CREATININE 0.88 07/21/2019   CREATININE 1.01 09/19/2017   CREATININE 0.98 06/05/2017   Assessment/Plan:   Type 2 diabetes mellitus without complication, without long-term current use of insulin ([place]). Good blood sugar control is important to decrease the likelihood of diabetic complications such as nephropathy, neuropathy, limb loss, blindness, coronary artery disease, and death. Intensive lifestyle modification including  diet, exercise and weight loss are the first line of treatment for diabetes. [name] will continue the Category 4 meal plan.  Hypertension associated with type 2 diabetes mellitus ([place]). [name] is working on healthy weight loss and exercise to improve blood pressure control. We will watch for signs of hypotension as she continues her lifestyle modifications. She will continue the Category 4 meal plan.  Class 3 severe obesity with serious comorbidity and body mass index (BMI) of 45.0 to 49.9 in adult, unspecified obesity type ([place]).  [name] is currently in the action stage of change. As such, her goal is to continue with weight loss efforts. She has agreed to the Category 4 Plan.   We provided Seasonings sheet to enhance Category 4 meal plan prepping.  Exercise goals: [name] will continue her current exercise regimen.  Behavioral modification strategies: increasing lean protein intake, decreasing simple carbohydrates, increasing vegetables, increasing water intake, decreasing eating out, no skipping meals, meal planning and cooking strategies, keeping healthy foods in the home, dealing with family or coworker sabotage, travel eating strategies, holiday eating strategies  and celebration eating strategies.  [name] has agreed to follow-up with our clinic in 4 weeks. She was informed of the importance of frequent follow-up visits to maximize her success with intensive lifestyle modifications for her multiple health conditions.   Objective:   Blood pressure 119/81, pulse 83, temperature 98.1 F (36.7 C), height 5\' 6"  (1.676 m), weight 285 lb (129.3 kg), SpO2 96 %. Body mass index is 46 kg/m.  General: Cooperative, alert, well developed, in no acute distress. HEENT: Conjunctivae and  lids unremarkable. Cardiovascular: Regular rhythm.  Lungs: Normal work of breathing. Neurologic: No focal deficits.   Lab Results  Component Value Date   CREATININE 0.88 07/21/2019   BUN 15 07/21/2019   NA  143 07/21/2019   K 4.2 07/21/2019   CL 100 07/21/2019   CO2 26 07/21/2019   Lab Results  Component Value Date   ALT 29 07/21/2019   AST 28 07/21/2019   ALKPHOS 68 07/21/2019   BILITOT 0.3 07/21/2019   Lab Results  Component Value Date   HGBA1C 6.4 (H) 07/21/2019   HGBA1C 6.6 (H) 10/12/2017   HGBA1C 6.4 06/05/2017   HGBA1C 6.5 03/05/2017   HGBA1C 6.6 (H) 12/01/2016   Lab Results  Component Value Date   INSULIN 35.9 (H) 07/21/2019   Lab Results  Component Value Date   TSH 0.807 07/21/2019   Lab Results  Component Value Date   CHOL 180 07/21/2019   HDL 50 07/21/2019   LDLCALC 97 07/21/2019   LDLDIRECT 110.0 08/21/2016   TRIG 193 (H) 07/21/2019   CHOLHDL 4 10/12/2017   Lab Results  Component Value Date   WBC 9.3 09/19/2017   HGB 12.1 09/19/2017   HCT 36.6 09/19/2017   MCV 86.5 09/19/2017   PLT 260.0 09/19/2017   No results found for: IRON, TIBC, FERRITIN  Attestation Statements:   Reviewed by clinician on day of visit: allergies, medications, problem list, medical history, surgical history, family history, social history, and previous encounter notes.  Time spent on visit including pre-visit chart review and post-visit charting and care was 20 minutes.   [name], am acting as Location manager for [employer], DO   I have reviewed the above documentation for accuracy and completeness, and I agree with the above. [name], DO

## 2019-09-09 ENCOUNTER — Ambulatory Visit: Payer: Medicare Other

## 2019-09-15 DIAGNOSIS — M25561 Pain in right knee: Secondary | ICD-10-CM | POA: Diagnosis not present

## 2019-09-17 ENCOUNTER — Encounter (INDEPENDENT_AMBULATORY_CARE_PROVIDER_SITE_OTHER): Payer: Self-pay | Admitting: Bariatrics

## 2019-09-17 ENCOUNTER — Other Ambulatory Visit (INDEPENDENT_AMBULATORY_CARE_PROVIDER_SITE_OTHER): Payer: Self-pay

## 2019-09-17 DIAGNOSIS — E559 Vitamin D deficiency, unspecified: Secondary | ICD-10-CM

## 2019-09-17 MED ORDER — VITAMIN D (ERGOCALCIFEROL) 1.25 MG (50000 UNIT) PO CAPS: 50000.0000 [IU] | ORAL_CAPSULE | ORAL | 0 refills | Status: DC

## 2019-10-07 ENCOUNTER — Other Ambulatory Visit: Payer: Self-pay

## 2019-10-07 ENCOUNTER — Ambulatory Visit (INDEPENDENT_AMBULATORY_CARE_PROVIDER_SITE_OTHER): Payer: Medicare Other | Admitting: Bariatrics

## 2019-10-07 ENCOUNTER — Encounter (INDEPENDENT_AMBULATORY_CARE_PROVIDER_SITE_OTHER): Payer: Self-pay | Admitting: Bariatrics

## 2019-10-07 VITALS — BP 112/76 | HR 78 | Temp 98.3°F | Ht 66.0 in | Wt 274.0 lb

## 2019-10-07 DIAGNOSIS — I10 Essential (primary) hypertension: Secondary | ICD-10-CM

## 2019-10-07 DIAGNOSIS — Z6841 Body Mass Index (BMI) 40.0 and over, adult: Secondary | ICD-10-CM

## 2019-10-07 DIAGNOSIS — E559 Vitamin D deficiency, unspecified: Secondary | ICD-10-CM | POA: Diagnosis not present

## 2019-10-07 DIAGNOSIS — K5909 Other constipation: Secondary | ICD-10-CM | POA: Diagnosis not present

## 2019-10-07 NOTE — Progress Notes (Signed)
Chief Complaint:   OBESITY [name] is here to discuss her progress with her obesity treatment plan along with follow-up of her obesity related diagnoses. [name] is on the Category 4 Plan and states she is following her eating plan approximately 90% of the time. [name] states she is swimming 60 minutes 1 time per week.  Today's visit was #: 5 Starting weight: 301 lbs Starting date: 07/21/2019 Today's weight: 274 lbs Today's date: 10/07/2019 Total lbs lost to date: 27 Total lbs lost since last in-office visit: 11  Interim History: [name] is down 11 lbs and has done very well overall. She reports being more active (moving and swimming) and has increased her water intake.  Subjective:   Essential hypertension. [name] is taking HCTZ and Micardis. Blood pressure is well controlled.  BP Readings from Last 3 Encounters:  10/07/19 112/76  09/08/19 119/81  08/21/19 (!) 121/58   Lab Results  Component Value Date   CREATININE 0.88 07/21/2019   CREATININE 1.01 09/19/2017   CREATININE 0.98 06/05/2017   Vitamin D deficiency. [name] is taking Vitamin D. Last Vitamin D 38.3 on 07/21/2019.  Other constipation. [name] is taking Metamucil.  Assessment/Plan:   Essential hypertension. [name] is working on healthy weight loss and exercise to improve blood pressure control. We will watch for signs of hypotension as she continues her lifestyle modifications. She will continue her medications as directed.  Vitamin D deficiency. Low Vitamin D level contributes to fatigue and are associated with obesity, breast, and colon cancer. She agrees to continue to take Vitamin D and will follow-up for routine testing of Vitamin D, at least 2-3 times per year to avoid over-replacement.  Other constipation. [name] was informed that a decrease in bowel movement frequency is normal while losing weight, but stools should not be hard or painful. Orders and follow up as documented in patient  record. She will add Colace OTC and increase her water intake. She will add wheat bran of flax seed as needed.  Counseling Getting to Good Bowel Health: Your goal is to have one soft bowel movement each day. Drink at least 8 glasses of water each day. Eat plenty of fiber (goal is over 25 grams each day). It is best to get most of your fiber from dietary sources which includes leafy green vegetables, fresh fruit, and whole grains. You may need to add fiber with the help of OTC fiber supplements. These include Metamucil, Citrucel, and Flaxseed. If you are still having trouble, try adding Miralax or Magnesium Citrate. If all of these changes do not work, Cabin crew.  Class 3 severe obesity with serious comorbidity and body mass index (BMI) of 40.0 to 44.9 in adult, unspecified obesity type ([place]).  [name] is currently in the action stage of change. As such, her goal is to continue with weight loss efforts. She has agreed to the Category 4 Plan.   She will work on meal planning, intentional eating, and decreasing carbohydrates.  Exercise goals: Older adults should follow the adult guidelines. When older adults cannot meet the adult guidelines, they should be as physically active as their abilities and conditions will allow.   Behavioral modification strategies: increasing lean protein intake, decreasing simple carbohydrates, increasing vegetables, increasing water intake, decreasing eating out, no skipping meals, meal planning and cooking strategies, keeping healthy foods in the home and avoiding temptations.  [name] has agreed to follow-up with our clinic in 2 weeks. She was informed of the importance of frequent  follow-up visits to maximize her success with intensive lifestyle modifications for her multiple health conditions.   Objective:   Blood pressure 112/76, pulse 78, temperature 98.3 F (36.8 C), height 5\' 6"  (1.676 m), weight 274 lb (124.3 kg), SpO2 97 %. Body mass index is  44.22 kg/m.  General: Cooperative, alert, well developed, in no acute distress. HEENT: Conjunctivae and lids unremarkable. Cardiovascular: Regular rhythm.  Lungs: Normal work of breathing. Neurologic: No focal deficits.   Lab Results  Component Value Date   CREATININE 0.88 07/21/2019   BUN 15 07/21/2019   NA 143 07/21/2019   K 4.2 07/21/2019   CL 100 07/21/2019   CO2 26 07/21/2019   Lab Results  Component Value Date   ALT 29 07/21/2019   AST 28 07/21/2019   ALKPHOS 68 07/21/2019   BILITOT 0.3 07/21/2019   Lab Results  Component Value Date   HGBA1C 6.4 (H) 07/21/2019   HGBA1C 6.6 (H) 10/12/2017   HGBA1C 6.4 06/05/2017   HGBA1C 6.5 03/05/2017   HGBA1C 6.6 (H) 12/01/2016   Lab Results  Component Value Date   INSULIN 35.9 (H) 07/21/2019   Lab Results  Component Value Date   TSH 0.807 07/21/2019   Lab Results  Component Value Date   CHOL 180 07/21/2019   HDL 50 07/21/2019   LDLCALC 97 07/21/2019   LDLDIRECT 110.0 08/21/2016   TRIG 193 (H) 07/21/2019   CHOLHDL 4 10/12/2017   Lab Results  Component Value Date   WBC 9.3 09/19/2017   HGB 12.1 09/19/2017   HCT 36.6 09/19/2017   MCV 86.5 09/19/2017   PLT 260.0 09/19/2017   No results found for: IRON, TIBC, FERRITIN  Obesity Behavioral Intervention Documentation for Insurance:   Approximately 15 minutes were spent on the discussion below.  ASK: We discussed the diagnosis of obesity with [name] today and [name] agreed to give [name] permission to discuss obesity behavioral modification therapy today.  ASSESS: [name] has the diagnosis of obesity and her BMI today is 44.3. [name] is in the action stage of change.   ADVISE: [name] was educated on the multiple health risks of obesity as well as the benefit of weight loss to improve her health. She was advised of the need for long term treatment and the importance of lifestyle modifications to improve her current health and to decrease her risk of future health  problems.  AGREE: Multiple dietary modification options and treatment options were discussed and [name] agreed to follow the recommendations documented in the above note.  ARRANGE: [name] was educated on the importance of frequent visits to treat obesity as outlined per CMS and USPSTF guidelines and agreed to schedule her next follow up appointment today.  Attestation Statements:   Reviewed by clinician on day of visit: allergies, medications, problem list, medical history, surgical history, family history, social history, and previous encounter notes.  [name], am acting as Location manager for [employer], DO   I have reviewed the above documentation for accuracy and completeness, and I agree with the above. [name], DO

## 2019-10-16 ENCOUNTER — Ambulatory Visit: Payer: Medicare Other

## 2019-10-21 ENCOUNTER — Encounter: Payer: Self-pay | Admitting: Family Medicine

## 2019-10-21 ENCOUNTER — Other Ambulatory Visit: Payer: Self-pay

## 2019-10-21 ENCOUNTER — Ambulatory Visit (INDEPENDENT_AMBULATORY_CARE_PROVIDER_SITE_OTHER): Payer: Medicare Other | Admitting: Family Medicine

## 2019-10-21 VITALS — BP 110/68 | HR 77 | Temp 97.3°F | Resp 12 | Ht 66.0 in | Wt 266.0 lb

## 2019-10-21 DIAGNOSIS — H81399 Other peripheral vertigo, unspecified ear: Secondary | ICD-10-CM | POA: Diagnosis not present

## 2019-10-21 DIAGNOSIS — R32 Unspecified urinary incontinence: Secondary | ICD-10-CM | POA: Diagnosis not present

## 2019-10-21 DIAGNOSIS — I10 Essential (primary) hypertension: Secondary | ICD-10-CM | POA: Diagnosis not present

## 2019-10-21 DIAGNOSIS — E1140 Type 2 diabetes mellitus with diabetic neuropathy, unspecified: Secondary | ICD-10-CM

## 2019-10-21 DIAGNOSIS — E559 Vitamin D deficiency, unspecified: Secondary | ICD-10-CM

## 2019-10-21 DIAGNOSIS — E782 Mixed hyperlipidemia: Secondary | ICD-10-CM | POA: Diagnosis not present

## 2019-10-21 DIAGNOSIS — R42 Dizziness and giddiness: Secondary | ICD-10-CM

## 2019-10-21 DIAGNOSIS — Z0000 Encounter for general adult medical examination without abnormal findings: Secondary | ICD-10-CM | POA: Diagnosis not present

## 2019-10-21 LAB — COMPREHENSIVE METABOLIC PANEL
ALT: 23 U/L (ref 0–35)
AST: 20 U/L (ref 0–37)
Albumin: 4.7 g/dL (ref 3.5–5.2)
Alkaline Phosphatase: 71 U/L (ref 39–117)
BUN: 24 mg/dL — ABNORMAL HIGH (ref 6–23)
CO2: 32 mEq/L (ref 19–32)
Calcium: 10.7 mg/dL — ABNORMAL HIGH (ref 8.4–10.5)
Chloride: 98 mEq/L (ref 96–112)
Creatinine, Ser: 0.94 mg/dL (ref 0.40–1.20)
GFR: 59.72 mL/min — ABNORMAL LOW (ref >60.00)
Glucose, Bld: 127 mg/dL — ABNORMAL HIGH (ref 70–99)
Potassium: 4.1 mEq/L (ref 3.5–5.1)
Sodium: 139 mEq/L (ref 135–145)
Total Bilirubin: 0.6 mg/dL (ref 0.2–1.2)
Total Protein: 7.5 g/dL (ref 6.0–8.3)

## 2019-10-21 NOTE — Assessment & Plan Note (Signed)
Patient encouraged to maintain heart healthy diet, regular exercise, adequate sleep. Consider daily probiotics. Take medications as prescribed. Labs reviewed 

## 2019-10-21 NOTE — Patient Instructions (Addendum)
Magnesium Glycinate 400 mg at bedtime   Jobst compression hose, on in am and off in pm. Light weight knee highs, 10-20 mmhg  Try ice and lidocaine topically to legs and consider podiatry referral for flat feet and foot pain Preventive Care 65 Years and Older, Female Preventive care refers to lifestyle choices and visits with your health care provider that can promote health and wellness. This includes:  A yearly physical exam. This is also called an annual well check.  Regular dental and eye exams.  Immunizations.  Screening for certain conditions.  Healthy lifestyle choices, such as diet and exercise. What can I expect for my preventive care visit? Physical exam Your health care provider will check:  Height and weight. These may be used to calculate body mass index (BMI), which is a measurement that tells if you are at a healthy weight.  Heart rate and blood pressure.  Your skin for abnormal spots. Counseling Your health care provider may ask you questions about:  Alcohol, tobacco, and drug use.  Emotional well-being.  Home and relationship well-being.  Sexual activity.  Eating habits.  History of falls.  Memory and ability to understand (cognition).  Work and work Statistician.  Pregnancy and menstrual history. What immunizations do I need?  Influenza (flu) vaccine  This is recommended every year. Tetanus, diphtheria, and pertussis (Tdap) vaccine  You may need a Td booster every 10 years. Varicella (chickenpox) vaccine  You may need this vaccine if you have not already been vaccinated. Zoster (shingles) vaccine  You may need this after age 35. Pneumococcal conjugate (PCV13) vaccine  One dose is recommended after age 35. Pneumococcal polysaccharide (PPSV23) vaccine  One dose is recommended after age 73. Measles, mumps, and rubella (MMR) vaccine  You may need at least one dose of MMR if you were born in 1957 or later. You may also need a second  dose. Meningococcal conjugate (MenACWY) vaccine  You may need this if you have certain conditions. Hepatitis A vaccine  You may need this if you have certain conditions or if you travel or work in places where you may be exposed to hepatitis A. Hepatitis B vaccine  You may need this if you have certain conditions or if you travel or work in places where you may be exposed to hepatitis B. Haemophilus influenzae type b (Hib) vaccine  You may need this if you have certain conditions. You may receive vaccines as individual doses or as more than one vaccine together in one shot (combination vaccines). Talk with your health care provider about the risks and benefits of combination vaccines. What tests do I need? Blood tests  Lipid and cholesterol levels. These may be checked every 5 years, or more frequently depending on your overall health.  Hepatitis C test.  Hepatitis B test. Screening  Lung cancer screening. You may have this screening every year starting at age 71 if you have a 30-pack-year history of smoking and currently smoke or have quit within the past 15 years.  Colorectal cancer screening. All adults should have this screening starting at age 30 and continuing until age 25. Your health care provider may recommend screening at age 10 if you are at increased risk. You will have tests every 1-10 years, depending on your results and the type of screening test.  Diabetes screening. This is done by checking your blood sugar (glucose) after you have not eaten for a while (fasting). You may have this done every 1-3 years.  Mammogram.  This may be done every 1-2 years. Talk with your health care provider about how often you should have regular mammograms.  BRCA-related cancer screening. This may be done if you have a family history of breast, ovarian, tubal, or peritoneal cancers. Other tests  Sexually transmitted disease (STD) testing.  Bone density scan. This is done to screen for  osteoporosis. You may have this done starting at age 51. Follow these instructions at home: Eating and drinking  Eat a diet that includes fresh fruits and vegetables, whole grains, lean protein, and low-fat dairy products. Limit your intake of foods with high amounts of sugar, saturated fats, and salt.  Take vitamin and mineral supplements as recommended by your health care provider.  Do not drink alcohol if your health care provider tells you not to drink.  If you drink alcohol: ? Limit how much you have to 0-1 drink a day. ? Be aware of how much alcohol is in your drink. In the [place] one drink equals one 12 oz bottle of beer (355 mL), one 5 oz glass of wine (148 mL), or one 1 oz glass of hard liquor (44 mL). Lifestyle  Take daily care of your teeth and gums.  Stay active. Exercise for at least 30 minutes on 5 or more days each week.  Do not use any products that contain nicotine or tobacco, such as cigarettes, e-cigarettes, and chewing tobacco. If you need help quitting, ask your health care provider.  If you are sexually active, practice safe sex. Use a condom or other form of protection in order to prevent STIs (sexually transmitted infections).  Talk with your health care provider about taking a low-dose aspirin or statin. What's next?  Go to your health care provider once a year for a well check visit.  Ask your health care provider how often you should have your eyes and teeth checked.  Stay up to date on all vaccines. This information is not intended to replace advice given to you by your health care provider. Make sure you discuss any questions you have with your health care provider. Document Revised: 05/09/2018 Document Reviewed: 05/09/2018 Elsevier Patient Education  2020 [place].  Dupuytren's Contracture Dupuytren's contracture is a condition in which tissue under the skin of the palm becomes thick. This causes one or more of the fingers to curl inward  (contract) toward the palm. After a while, the fingers may not be able to straighten out. This condition affects some or all of the fingers and the palm of the hand. This condition may affect one or both hands. Dupuytren's contracture is a long-term (chronic) condition that develops (progresses) slowly over time. There is no cure, but symptoms can be managed and progression can be slowed with treatment. This condition is usually not dangerous or painful, but it can interfere with everyday tasks. What are the causes?  This condition is caused by tissue (fascia) in the palm that gets thicker and tighter. When the fascia thickens, it pulls on the cords of tissue (tendons) that control finger movement. This causes the fingers to contract. The cause of fascia thickening is not known. However, the condition is often passed along from parent to child (inherited). What increases the risk? The following factors may make you more likely to develop this condition:  Being 38 years of age or older.  Being female.  Having a family history of this condition.  Using tobacco products, including cigarettes, chewing tobacco, and e-cigarettes.  Drinking alcohol excessively.  Having diabetes.  Having a seizure disorder. What are the signs or symptoms? Early symptoms of this condition may include:  Thick, puckered skin on the hand.  One or more lumps (nodules) on the palm. Nodules may be tender when they first appear, but they are generally painless. Later symptoms of this condition may include:  Thick cords of tissue in the palm.  Fingers curled up toward the palm.  Inability to straighten the fingers into their normal position. Though this condition is usually painless, you may have discomfort when holding or grabbing objects. How is this diagnosed? This condition is diagnosed with a physical exam, which may include:  Looking at your hands and feeling your palms. This is to check for thickened  fascia and nodules.  Measuring finger motion.  Doing the [place] tabletop test. You may be asked to try to put your hand on a surface, with your palm down and your fingers straight out. How is this treated? There is no cure for this condition, but treatment can relieve discomfort and make symptoms more manageable. Treatment options may include:  Physical therapy. This can strengthen your hand and increase flexibility.  Occupational therapy. This can help you with everyday tasks that may be more difficult because of your condition.  Shots (injections). Substances may be injected into your hand, such as: ? Medicines that help to decrease swelling (corticosteroids). ? Proteins (collagenase) to weaken thick tissue. After a collagenase injection, your health care provider may stretch your fingers.  Needle aponeurotomy. A needle is pushed through the skin and into the fascia. Moving the needle against the fascia can weaken or break up the thick tissue.  Surgery. This may be needed if your condition causes discomfort or interferes with everyday activities. Physical therapy is usually needed after surgery. No treatment is guaranteed to cure this condition. Recurrence of symptoms is common. Follow these instructions at home: Hand care  Take these actions to help protect your hand from possible injury: ? Use tools that have padded grips. ? Wear protective gloves while you work with your hands. ? Avoid repetitive hand movements. General instructions  Take over-the-counter and prescription medicines only as told by your health care provider.  Manage any other conditions that you have, such as diabetes.  If physical therapy was prescribed, do exercises as told by your health care provider.  Do not use any products that contain nicotine or tobacco, such as cigarettes, e-cigarettes, and chewing tobacco. If you need help quitting, ask your health care provider.  If you drink alcohol: ? Limit  how much you use to:  0-1 drink a day for women.  0-2 drinks a day for men. ? Be aware of how much alcohol is in your drink. In the [place] one drink equals one 12 oz bottle of beer (355 mL), one 5 oz glass of wine (148 mL), or one 1 oz glass of hard liquor (44 mL).  Keep all follow-up visits as told by your health care provider. This is important. Contact a health care provider if:  You develop new symptoms, or your symptoms get worse.  You have pain that gets worse or does not get better with medicine.  You have difficulty or discomfort with everyday tasks.  You develop numbness or tingling. Get help right away if:  You have severe pain.  Your fingers change color or become unusually cold. Summary  Dupuytren's contracture is a condition in which tissue under the skin of the palm becomes thick.  This  condition is caused by tissue (fascia) that thickens. When it thickens, it pulls on the cords of tissue (tendons) that control finger movement and makes the fingers to contract.  You are more likely to develop this condition if you are a man, are over 13 years of age, have a family history of the condition, and drink a lot of alcohol.  This condition can be treated with physical and occupational therapy, injections, and surgery.  Follow instructions about how to care for your hand. Get help right away if you have severe pain or your fingers change color or become cold. This information is not intended to replace advice given to you by your health care provider. Make sure you discuss any questions you have with your health care provider. Document Revised: 12/04/2017 Document Reviewed: 12/04/2017 Elsevier Patient Education  [place].

## 2019-10-21 NOTE — Progress Notes (Signed)
Subjective:    Patient ID: [name], female    DOB: [birth year], 64 y.o.   MRN: [id number]  Chief Complaint  Patient presents with  . Annual Exam    HPI Patient is in today for annual preventative exam and follow up on chronic medical concerns. No recent febrile illness or hospitalizations. She has had a couple of episodes of vertigo. No recent trauma or head injury.no other neurologic complaints. She is struggling with persistent knee pain, has suffered a fall. She is working with [facility] and feels better as she looses weight. Denies CP/palp/SOB/HA/congestion/fevers/GI c/o. Taking meds as prescribed. She is struggling with worsening urinary incontinence. No dysuria or hematuria.   Past Medical History:  Diagnosis Date  . Allergic rhinitis   . Allergy   . Anemia 07/17/2016  . Anxiety   . Asthma    seasonal  . Blood transfusion without reported diagnosis   . Breast cancer ([place])    right  . Bronchitis   . Colon polyps   . CPAP (continuous positive airway pressure) dependence   . Depression   . Diabetes mellitus    borderline but takes metformin  . Edema   . Family history of breast cancer in first degree relative   . Fibromyalgia   . GERD (gastroesophageal reflux disease)   . Hyperglycemia 03/05/2017  . Hyperlipidemia 08/21/2016   no meds  . Hyperparathyroidism   . Hypertension    controlled by medications  . Joint pain   . Neuromuscular disorder (HCC)    Fibromyalgia  . Obesity   . Osteoarthritis    RA  . PONV (postoperative nausea and vomiting)    occasional  . Pre-diabetes   . Sleep apnea    wears cpap  . Viral meningitis   . Vitamin D deficiency     Past Surgical History:  Procedure Laterality Date  . ABDOMINAL HYSTERECTOMY  1998  . ADENOIDECTOMY    . BREAST EXCISIONAL BIOPSY Left 1993   benign  . BREAST SURGERY     Tran flap due to breast cancer  . CESAREAN SECTION  1988  . COLONOSCOPY  08/14/2011  . HAND SURGERY     dog bite, right hand  . HAND SURGERY     trauma, left hand  . KNEE ARTHROSCOPY     bilateral  . left knee replacement  2010  . MASTECTOMY  01/13/94   Right breast  . parathyroid resection    . PARATHYROIDECTOMY    . POLYPECTOMY    . rectal abscess    . SEPTOPLASTY  1980  . SHOULDER ARTHROSCOPY Right 11/09/2015   Procedure: ARTHROSCOPY SHOULDER-acromioplasty, distal clavicle resection and debridement;  Surgeon: Melrose Nakayama, MD;  Location: [facility];  Service: Orthopedics;  Laterality: Right;  . shoulder arthroscopy     rotator cuff repair  . TOE SURGERY Right    paronychia and adenoma removed  . TONSILLECTOMY    . TOTAL KNEE ARTHROPLASTY  06/18/08   [facility]  . TUMOR REMOVAL  1982   , scalp    Family History  Problem Relation Age of Onset  . Emphysema Father   . Lung cancer Father        lung ca dx 39  . Other Father        prostate issues  . Diabetes Father   . Breast cancer Maternal Aunt        dx late 75s  . Colon cancer Maternal Aunt  dx 32s  . Colon polyps Maternal Aunt   . Prostate cancer Maternal Grandfather 85       d. 85y  . Heart disease Paternal Grandfather   . Heart attack Paternal Grandfather        d. 50y  . Diabetes Paternal Grandfather   . Asthma Daughter        "seasonal"  . Arthritis Maternal Grandmother   . Aneurysm Maternal Grandmother        d. brain aneurysm at 66  . Arthritis Mother   . Colon polyps Mother   . Hypertension Mother   . Sleep apnea Mother   . Multiple sclerosis Sister   . Breast cancer Sister 65       L IDC and DCIS; ER/PR+, Her2-  . Fibrocystic breast disease Sister   . Arthritis/Rheumatoid Sister   . Breast cancer Sister   . Cancer Maternal Uncle        d. mouth cancer at younger age; smoker  . Other Paternal Uncle        muscle issues - couldn't walk or talk; d. 20y  . Cancer Maternal Aunt        lymphoma, dx 64s  . Ovarian cancer Maternal Aunt        dx 81s; d. late 29s  . Lung cancer Maternal Uncle         d. 9y; former smoker  . Throat cancer Cousin        maternal 1st cousin; smoker  . Breast cancer Cousin 32       maternal 1st cousin  . Other Paternal Uncle        prostate issues  . Breast cancer Cousin        paternal 1st cousin dx late 74s  . Throat cancer Cousin        maternal 1st cousin; used SL tobacco  . Prostate cancer Cousin        maternal 1st cousin dx 37s  . Esophageal cancer Neg Hx   . Rectal cancer Neg Hx   . Stomach cancer Neg Hx     Social History   Socioeconomic History  . Marital status: Widowed    Spouse name: Not on file  . Number of children: 1  . Years of education: Not on file  . Highest education level: 12th grade  Occupational History  . Occupation: ACCOUNTING    Employer: [employer]  Tobacco Use  . Smoking status: Never Smoker  . Smokeless tobacco: Never Used  Substance and Sexual Activity  . Alcohol use: Not Currently  . Drug use: No  . Sexual activity: Not on file  Other Topics Concern  . Not on file  Social History Narrative  . Not on file   Social Determinants of Health   Financial Resource Strain:   . Difficulty of Paying Living Expenses:   Food Insecurity:   . Worried About Charity fundraiser in the Last Year:   . Arboriculturist in the Last Year:   Transportation Needs:   . Film/video editor (Medical):   Marland Kitchen Lack of Transportation (Non-Medical):   Physical Activity:   . Days of Exercise per Week:   . Minutes of Exercise per Session:   Stress:   . Feeling of Stress :   Social Connections:   . Frequency of Communication with Friends and Family:   . Frequency of Social Gatherings with Friends and Family:   . Attends Religious Services:   .  Active Member of Clubs or Organizations:   . Attends Archivist Meetings:   Marland Kitchen Marital Status:   Intimate Partner Violence:   . Fear of Current or Ex-Partner:   . Emotionally Abused:   Marland Kitchen Physically Abused:   . Sexually Abused:     Outpatient Medications Prior to  Visit  Medication Sig Dispense Refill  . albuterol (VENTOLIN HFA) 108 (90 Base) MCG/ACT inhaler Inhale 2 puffs into the lungs every 6 (six) hours as needed. 18 g 0  . Calcium Carbonate (CALTRATE 600) 1500 MG TABS Take 1,500 mg by mouth daily.     . diclofenac (VOLTAREN) 75 MG EC tablet Take 1 tablet (75 mg total) by mouth 2 (two) times daily. 20 tablet 0  . famotidine (PEPCID) 40 MG tablet Take 1 tablet (40 mg total) by mouth daily. 30 tablet 5  . furosemide (LASIX) 40 MG tablet Take 1 tablet (40 mg total) by mouth 2 (two) times daily. 180 tablet 1  . glucosamine-chondroitin 500-400 MG tablet Take 2 tablets by mouth 2 (two) times daily.    . hydrochlorothiazide (MICROZIDE) 12.5 MG capsule Take 1 capsule (12.5 mg total) by mouth daily. 90 capsule 1  . metFORMIN (GLUCOPHAGE) 500 MG tablet Take 1 tablet (500 mg total) by mouth 2 (two) times daily with a meal. 180 tablet 1  . Multiple Vitamins-Minerals (MULTIVITAMIN & MINERAL PO) Take 1 tablet by mouth daily.    Marland Kitchen telmisartan (MICARDIS) 20 MG tablet Take 1 tablet (20 mg total) by mouth daily. 90 tablet 1  . Vitamin D, Ergocalciferol, (DRISDOL) 1.25 MG (50000 UNIT) CAPS capsule Take 1 capsule (50,000 Units total) by mouth every 7 (seven) days. 4 capsule 0  . Zinc 30 MG CAPS Take by mouth.    . benzonatate (TESSALON) 100 MG capsule Take 1 capsule (100 mg total) by mouth 3 (three) times daily as needed for cough. 30 capsule 0  . cholecalciferol (VITAMIN D) 25 MCG (1000 UNIT) tablet Take 1,000 Units by mouth daily.    Marland Kitchen doxycycline (VIBRA-TABS) 100 MG tablet Take 1 tablet (100 mg total) by mouth 2 (two) times daily. Can give caps or generic. 20 tablet 0  . Echinacea 500 MG CAPS Take 760 mg by mouth daily.    Marland Kitchen esomeprazole (NEXIUM) 40 MG capsule Take 1 capsule (40 mg total) by mouth daily. 90 capsule 3  . fluticasone (FLONASE) 50 MCG/ACT nasal spray Place 2 sprays into both nostrils daily. 16 g 2  . folic acid (FOLVITE) 202 MCG tablet Take 400 mcg by  mouth daily.     No facility-administered medications prior to visit.    Allergies  Allergen Reactions  . Allopurinol Hives  . Mucinex [Guaifenesin Er] Shortness Of Breath  . Penicillins Hives and Other (See Comments)    Has patient had a PCN reaction causing immediate rash, facial/tongue/throat swelling, SOB or lightheadedness with hypotension: no Has patient had a PCN reaction causing severe rash involving mucus membranes or skin necrosis: no Has patient had a PCN reaction that required hospitalization no Has patient had a PCN reaction occurring within the last 10 years: no If all of the above answers are "NO", then may proceed with Cephalosporin use.   . Prozac [Fluoxetine Hcl] Hives  . Amitriptyline Other (See Comments)    "felt weird, fatigue, dizziness"  . Lexapro [Escitalopram Oxalate]     Nausea and hypersalivation.     Review of Systems  Constitutional: Negative for fever and malaise/fatigue.  HENT: Negative for  congestion, ear pain, hearing loss and tinnitus.   Eyes: Negative for blurred vision and discharge.  Respiratory: Negative for shortness of breath.   Cardiovascular: Negative for chest pain, palpitations and leg swelling.  Gastrointestinal: Negative for abdominal pain, blood in stool and nausea.  Genitourinary: Negative for dysuria and frequency.  Musculoskeletal: Negative for falls.  Skin: Negative for rash.  Neurological: Negative for dizziness, loss of consciousness and headaches.  Endo/Heme/Allergies: Negative for environmental allergies.  Psychiatric/Behavioral: Negative for depression. The patient is not nervous/anxious.        Objective:    Physical Exam Constitutional:      General: She is not in acute distress.    Appearance: She is well-developed.  HENT:     Head: Normocephalic and atraumatic.  Eyes:     Conjunctiva/sclera: Conjunctivae normal.  Neck:     Thyroid: No thyromegaly.  Cardiovascular:     Rate and Rhythm: Normal rate and  regular rhythm.     Heart sounds: Normal heart sounds. No murmur.  Pulmonary:     Effort: Pulmonary effort is normal. No respiratory distress.     Breath sounds: Normal breath sounds.  Abdominal:     General: Bowel sounds are normal. There is no distension.     Palpations: Abdomen is soft. There is no mass.     Tenderness: There is no abdominal tenderness.  Musculoskeletal:     Cervical back: Neck supple.  Lymphadenopathy:     Cervical: No cervical adenopathy.  Skin:    General: Skin is warm and dry.  Neurological:     Mental Status: She is alert and oriented to person, place, and time.  Psychiatric:        Behavior: Behavior normal.     BP 110/68 (BP Location: Left Arm, Cuff Size: Large)   Pulse 77   Temp (!) 97.3 F (36.3 C) (Temporal)   Resp 12   Ht _0  (1.676 m)   Wt 266 lb (120.7 kg)   SpO2 97%   BMI 42.93 kg/m  Wt Readings from Last 3 Encounters:  10/22/19 269 lb (122 kg)  10/21/19 266 lb (120.7 kg)  10/07/19 274 lb (124.3 kg)    Diabetic Foot Exam - Simple   No data filed     Lab Results  Component Value Date   WBC 9.3 09/19/2017   HGB 12.1 09/19/2017   HCT 36.6 09/19/2017   PLT 260.0 09/19/2017   GLUCOSE 127 (H) 10/21/2019   CHOL 180 07/21/2019   TRIG 193 (H) 07/21/2019   HDL 50 07/21/2019   LDLDIRECT 110.0 08/21/2016   LDLCALC 97 07/21/2019   ALT 23 10/21/2019   AST 20 10/21/2019   NA 139 10/21/2019   K 4.1 10/21/2019   CL 98 10/21/2019   CREATININE 0.94 10/21/2019   BUN 24 (H) 10/21/2019   CO2 32 10/21/2019   TSH 0.807 07/21/2019   INR 2.0 (H) 06/21/2008   HGBA1C 6.4 (H) 07/21/2019    Lab Results  Component Value Date   TSH 0.807 07/21/2019   Lab Results  Component Value Date   WBC 9.3 09/19/2017   HGB 12.1 09/19/2017   HCT 36.6 09/19/2017   MCV 86.5 09/19/2017   PLT 260.0 09/19/2017   Lab Results  Component Value Date   NA 139 10/21/2019   K 4.1 10/21/2019   CO2 32 10/21/2019   GLUCOSE 127 (H) 10/21/2019   BUN 24 (H)  10/21/2019   CREATININE 0.94 10/21/2019   BILITOT 0.6 10/21/2019  ALKPHOS 71 10/21/2019   AST 20 10/21/2019   ALT 23 10/21/2019   PROT 7.5 10/21/2019   ALBUMIN 4.7 10/21/2019   CALCIUM 10.7 (H) 10/21/2019   ANIONGAP 11 11/09/2015   GFR 59.72 (L) 10/21/2019   Lab Results  Component Value Date   CHOL 180 07/21/2019   Lab Results  Component Value Date   HDL 50 07/21/2019   Lab Results  Component Value Date   LDLCALC 97 07/21/2019   Lab Results  Component Value Date   TRIG 193 (H) 07/21/2019   Lab Results  Component Value Date   CHOLHDL 4 10/12/2017   Lab Results  Component Value Date   HGBA1C 6.4 (H) 07/21/2019       Assessment & Plan:   Problem List Items Addressed This Visit    Vitamin D deficiency    Supplement and monitor      Essential hypertension    Well controlled, no changes to meds. Encouraged heart healthy diet such as the DASH diet and exercise as tolerated.       Relevant Orders   Comprehensive metabolic panel (Completed)   Diabetes mellitus, type 2 (HCC)    hgba1c acceptable, minimize simple carbs. Increase exercise as tolerated. Continue current meds      Preventative health care    Patient encouraged to maintain heart healthy diet, regular exercise, adequate sleep. Consider daily probiotics. Take medications as prescribed. Labs reviewed      Hyperlipidemia    Encouraged heart healthy diet, increase exercise, avoid trans fats, consider a krill oil cap daily      Vertigo    Hydrate and consider PT if symptoms worsen. Can use Meclizine prn. She has an occasional headache, a couple of falls we have ordered an MR of the brain to rule out any pathology.       Urinary incontinence - Primary    Referred to urology for evaluation      Relevant Orders   Ambulatory referral to Urology   Other peripheral vertigo, unspecified ear   Relevant Orders   MR Brain W Wo Contrast (Completed)      I have discontinued Maud K. Miyasato's  esomeprazole, folic acid, cholecalciferol, Echinacea, fluticasone, benzonatate, and doxycycline. I am also having her maintain her calcium carbonate, Multiple Vitamins-Minerals (MULTIVITAMIN & MINERAL PO), diclofenac, famotidine, telmisartan, hydrochlorothiazide, furosemide, Zinc, glucosamine-chondroitin, metFORMIN, and albuterol.  No orders of the defined types were placed in this encounter.    Penni Homans, MD

## 2019-10-21 NOTE — Assessment & Plan Note (Signed)
Well controlled, no changes to meds. Encouraged heart healthy diet such as the DASH diet and exercise as tolerated.  °

## 2019-10-21 NOTE — Assessment & Plan Note (Signed)
Supplement and monitor 

## 2019-10-21 NOTE — Assessment & Plan Note (Signed)
hgba1c acceptable, minimize simple carbs. Increase exercise as tolerated. Continue current meds 

## 2019-10-21 NOTE — Assessment & Plan Note (Signed)
Encouraged heart healthy diet, increase exercise, avoid trans fats, consider a krill oil cap daily 

## 2019-10-22 ENCOUNTER — Encounter: Payer: Self-pay | Admitting: Family Medicine

## 2019-10-22 ENCOUNTER — Encounter (INDEPENDENT_AMBULATORY_CARE_PROVIDER_SITE_OTHER): Payer: Self-pay | Admitting: Bariatrics

## 2019-10-22 ENCOUNTER — Ambulatory Visit (INDEPENDENT_AMBULATORY_CARE_PROVIDER_SITE_OTHER): Payer: Medicare Other | Admitting: Bariatrics

## 2019-10-22 VITALS — BP 100/70 | HR 83 | Temp 98.5°F | Ht 66.0 in | Wt 269.0 lb

## 2019-10-22 DIAGNOSIS — Z6841 Body Mass Index (BMI) 40.0 and over, adult: Secondary | ICD-10-CM

## 2019-10-22 DIAGNOSIS — E119 Type 2 diabetes mellitus without complications: Secondary | ICD-10-CM | POA: Diagnosis not present

## 2019-10-22 DIAGNOSIS — E559 Vitamin D deficiency, unspecified: Secondary | ICD-10-CM

## 2019-10-22 MED ORDER — VITAMIN D (ERGOCALCIFEROL) 1.25 MG (50000 UNIT) PO CAPS: 50000.0000 [IU] | ORAL_CAPSULE | ORAL | 0 refills | Status: DC

## 2019-10-22 NOTE — Progress Notes (Signed)
Chief Complaint:   OBESITY [name] is here to discuss her progress with her obesity treatment plan along with follow-up of her obesity related diagnoses. [name] is on the Category 4 Plan and states she is following her eating plan approximately 85% of the time. [name] states she is swimming/increased exercise 60 minutes 3 times per week.  Today's visit was #: 6 Starting weight: 301 lbs Starting date: 07/21/2019 Today's weight: 269 lbs Today's date: 10/22/2019 Total lbs lost to date: 32 Total lbs lost since last in-office visit: 5  Interim History: [name] is down 5 lbs. She states that she had a tooth pulled and has not been eating "the exact right things."  Subjective:   Vitamin D deficiency. No nausea, vomiting, or muscle weakness. Last Vitamin D 38.3 on 07/21/2019.  Type 2 diabetes mellitus without complication, without long-term current use of insulin ([place]). [name] is taking metformin.  Lab Results  Component Value Date   HGBA1C 6.4 (H) 07/21/2019   HGBA1C 6.6 (H) 10/12/2017   HGBA1C 6.4 06/05/2017   Lab Results  Component Value Date   LDLCALC 97 07/21/2019   CREATININE 0.94 10/21/2019   Lab Results  Component Value Date   INSULIN 35.9 (H) 07/21/2019   Assessment/Plan:   Vitamin D deficiency. Low Vitamin D level contributes to fatigue and are associated with obesity, breast, and colon cancer. She was given a prescription for Vitamin D, Ergocalciferol, (DRISDOL) 1.25 MG (50000 UNIT) CAPS capsule every week #4 with 0 refills and will follow-up for routine testing of Vitamin D, at least 2-3 times per year to avoid over-replacement.   Type 2 diabetes mellitus without complication, without long-term current use of insulin ([place]). Good blood sugar control is important to decrease the likelihood of diabetic complications such as nephropathy, neuropathy, limb loss, blindness, coronary artery disease, and death. Intensive lifestyle modification including diet,  exercise and weight loss are the first line of treatment for diabetes. [name] will continue her medication as directed.   Class 3 severe obesity with serious comorbidity and body mass index (BMI) of 40.0 to 44.9 in adult, unspecified obesity type ([place]).  [name] is currently in the action stage of change. As such, her goal is to continue with weight loss efforts. She has agreed to the Category 4 Plan.   She will work on meal planning and intentional eating.   Exercise goals: Older adults should follow the adult guidelines. When older adults cannot meet the adult guidelines, they should be as physically active as their abilities and conditions will allow.   Behavioral modification strategies: increasing lean protein intake, decreasing simple carbohydrates, increasing vegetables, increasing water intake, decreasing eating out, no skipping meals, meal planning and cooking strategies, keeping healthy foods in the home and planning for success.  [name] has agreed to follow-up with our clinic in 3-4 weeks. She was informed of the importance of frequent follow-up visits to maximize her success with intensive lifestyle modifications for her multiple health conditions.   Objective:   Blood pressure 100/70, pulse 83, temperature 98.5 F (36.9 C), temperature source Oral, height 5\' 6"  (1.676 m), weight 269 lb (122 kg), SpO2 97 %. Body mass index is 43.42 kg/m.  General: Cooperative, alert, well developed, in no acute distress. HEENT: Conjunctivae and lids unremarkable. Cardiovascular: Regular rhythm.  Lungs: Normal work of breathing. Neurologic: No focal deficits.   Lab Results  Component Value Date   CREATININE 0.94 10/21/2019   BUN 24 (H) 10/21/2019   NA 139  10/21/2019   K 4.1 10/21/2019   CL 98 10/21/2019   CO2 32 10/21/2019   Lab Results  Component Value Date   ALT 23 10/21/2019   AST 20 10/21/2019   ALKPHOS 71 10/21/2019   BILITOT 0.6 10/21/2019   Lab Results  Component Value  Date   HGBA1C 6.4 (H) 07/21/2019   HGBA1C 6.6 (H) 10/12/2017   HGBA1C 6.4 06/05/2017   HGBA1C 6.5 03/05/2017   HGBA1C 6.6 (H) 12/01/2016   Lab Results  Component Value Date   INSULIN 35.9 (H) 07/21/2019   Lab Results  Component Value Date   TSH 0.807 07/21/2019   Lab Results  Component Value Date   CHOL 180 07/21/2019   HDL 50 07/21/2019   LDLCALC 97 07/21/2019   LDLDIRECT 110.0 08/21/2016   TRIG 193 (H) 07/21/2019   CHOLHDL 4 10/12/2017   Lab Results  Component Value Date   WBC 9.3 09/19/2017   HGB 12.1 09/19/2017   HCT 36.6 09/19/2017   MCV 86.5 09/19/2017   PLT 260.0 09/19/2017   No results found for: IRON, TIBC, FERRITIN  Obesity Behavioral Intervention Documentation for Insurance:   Approximately 15 minutes were spent on the discussion below.  ASK: We discussed the diagnosis of obesity with [name] today and [name] agreed to give [name] permission to discuss obesity behavioral modification therapy today.  ASSESS: [name] has the diagnosis of obesity and her BMI today is 43.4. [name] is in the action stage of change.   ADVISE: [name] was educated on the multiple health risks of obesity as well as the benefit of weight loss to improve her health. She was advised of the need for long term treatment and the importance of lifestyle modifications to improve her current health and to decrease her risk of future health problems.  AGREE: Multiple dietary modification options and treatment options were discussed and [name] agreed to follow the recommendations documented in the above note.  ARRANGE: [name] was educated on the importance of frequent visits to treat obesity as outlined per CMS and USPSTF guidelines and agreed to schedule her next follow up appointment today.  Attestation Statements:   Reviewed by clinician on day of visit: allergies, medications, problem list, medical history, surgical history, family history, social history, and previous encounter  notes.  [name], am acting as Location manager for [employer], DO   I have reviewed the above documentation for accuracy and completeness, and I agree with the above. [name], DO

## 2019-10-24 ENCOUNTER — Encounter: Payer: Self-pay | Admitting: *Deleted

## 2019-10-25 ENCOUNTER — Ambulatory Visit (HOSPITAL_BASED_OUTPATIENT_CLINIC_OR_DEPARTMENT_OTHER)
Admission: RE | Admit: 2019-10-25 | Discharge: 2019-10-25 | Disposition: A | Discharge status: Home / Self Care | Payer: Medicare Other | Source: Ambulatory Visit | Attending: Family Medicine | Admitting: Family Medicine

## 2019-10-25 ENCOUNTER — Other Ambulatory Visit: Payer: Self-pay

## 2019-10-25 ENCOUNTER — Other Ambulatory Visit: Payer: Self-pay | Admitting: Family Medicine

## 2019-10-25 DIAGNOSIS — R42 Dizziness and giddiness: Secondary | ICD-10-CM | POA: Diagnosis not present

## 2019-10-25 DIAGNOSIS — H81399 Other peripheral vertigo, unspecified ear: Secondary | ICD-10-CM | POA: Insufficient documentation

## 2019-10-25 MED ORDER — GADOBUTROL 1 MMOL/ML IV SOLN
10.0000 mL | Freq: Once | INTRAVENOUS | Status: AC | PRN
  Administered 2019-10-25: 10 mL via INTRAVENOUS

## 2019-10-27 DIAGNOSIS — H81399 Other peripheral vertigo, unspecified ear: Secondary | ICD-10-CM

## 2019-10-27 DIAGNOSIS — R32 Unspecified urinary incontinence: Secondary | ICD-10-CM

## 2019-10-27 HISTORY — DX: Other peripheral vertigo, unspecified ear: H81.399

## 2019-10-27 HISTORY — DX: Unspecified urinary incontinence: R32

## 2019-10-27 NOTE — Assessment & Plan Note (Addendum)
Hydrate and consider PT if symptoms worsen. Can use Meclizine prn. She has an occasional headache, a couple of falls we have ordered an MR of the brain to rule out any pathology.

## 2019-10-27 NOTE — Assessment & Plan Note (Signed)
Referred to urology for evaluation

## 2019-11-13 ENCOUNTER — Encounter (INDEPENDENT_AMBULATORY_CARE_PROVIDER_SITE_OTHER): Payer: Self-pay | Admitting: Bariatrics

## 2019-11-13 ENCOUNTER — Encounter: Payer: Self-pay | Admitting: Family Medicine

## 2019-11-13 ENCOUNTER — Ambulatory Visit (INDEPENDENT_AMBULATORY_CARE_PROVIDER_SITE_OTHER): Payer: Medicare Other | Admitting: Bariatrics

## 2019-11-13 ENCOUNTER — Other Ambulatory Visit: Payer: Self-pay

## 2019-11-13 VITALS — BP 120/83 | HR 70 | Temp 98.8°F | Ht 66.0 in | Wt 269.0 lb

## 2019-11-13 DIAGNOSIS — E559 Vitamin D deficiency, unspecified: Secondary | ICD-10-CM | POA: Diagnosis not present

## 2019-11-13 DIAGNOSIS — Z6841 Body Mass Index (BMI) 40.0 and over, adult: Secondary | ICD-10-CM

## 2019-11-13 DIAGNOSIS — E669 Obesity, unspecified: Secondary | ICD-10-CM | POA: Diagnosis not present

## 2019-11-13 DIAGNOSIS — E1169 Type 2 diabetes mellitus with other specified complication: Secondary | ICD-10-CM

## 2019-11-13 DIAGNOSIS — E66813 Obesity, class 3: Secondary | ICD-10-CM

## 2019-11-13 MED ORDER — VITAMIN D (ERGOCALCIFEROL) 1.25 MG (50000 UNIT) PO CAPS: 50000.0000 [IU] | ORAL_CAPSULE | ORAL | 0 refills | Status: DC

## 2019-11-13 NOTE — Progress Notes (Signed)
Chief Complaint:   OBESITY [name] is here to discuss her progress with her obesity treatment plan along with follow-up of her obesity related diagnoses. [name] is on the Category 4 Plan and states she is following her eating plan approximately 85% of the time. [name] states she is swimming 60 minutes 1 time per week.  Today's visit was #: 7 Starting weight: 301 lbs Starting date: 07/21/2019 Today's weight: 269 lbs Today's date: 11/13/2019 Total lbs lost to date: 32 Total lbs lost since last in-office visit: 0  Interim History: [name]'s weight remains the same.  Subjective:   Type 2 diabetes mellitus with obesity ([place]). [name] is taking metformin.   Lab Results  Component Value Date   HGBA1C 6.4 (H) 07/21/2019   HGBA1C 6.6 (H) 10/12/2017   HGBA1C 6.4 06/05/2017   Lab Results  Component Value Date   LDLCALC 97 07/21/2019   CREATININE 0.94 10/21/2019   Lab Results  Component Value Date   INSULIN 35.9 (H) 07/21/2019   Vitamin D deficiency. No nausea, vomiting, or muscle weakness. Last Vitamin D was 38.3 on 07/21/2019.  Assessment/Plan:   Type 2 diabetes mellitus with obesity ([place]). Good blood sugar control is important to decrease the likelihood of diabetic complications such as nephropathy, neuropathy, limb loss, blindness, coronary artery disease, and death. Intensive lifestyle modification including diet, exercise and weight loss are the first line of treatment for diabetes. [name] will continue her current medication as directed. Hemoglobin A1c, Insulin, random labs ordered.  Vitamin D deficiency. Low Vitamin D level contributes to fatigue and are associated with obesity, breast, and colon cancer. She was given a prescription for Vitamin D, Ergocalciferol, (DRISDOL) 1.25 MG (50000 UNIT) CAPS capsule every week #4 with 0 refills and VITAMIN D 25 Hydroxy (Vit-D Deficiency, Fractures) level was ordered today.  Class 3 severe obesity with serious  comorbidity and body mass index (BMI) of 40.0 to 44.9 in adult, unspecified obesity type ([place]).  [name] is currently in the action stage of change. As such, her goal is to continue with weight loss efforts. She has agreed to the Category 4 Plan minus 100 calories.   She will work on meal planning, intentional eating, and decreasing her sodium intake.  Exercise goals: [name] will continue to swim once a week and increase her activities.  Behavioral modification strategies: increasing lean protein intake, decreasing simple carbohydrates, increasing vegetables, increasing water intake, decreasing eating out, no skipping meals, meal planning and cooking strategies, keeping healthy foods in the home and planning for success.  [name] has agreed to follow-up with our clinic in 2-3 weeks. She was informed of the importance of frequent follow-up visits to maximize her success with intensive lifestyle modifications for her multiple health conditions.   [name] was informed we would discuss her lab results at her next visit unless there is a critical issue that needs to be addressed sooner. [name] agreed to keep her next visit at the agreed upon time to discuss these results.  Objective:   Blood pressure 120/83, pulse 70, temperature 98.8 F (37.1 C), height 5\' 6"  (1.676 m), weight 269 lb (122 kg), SpO2 95 %. Body mass index is 43.42 kg/m.  General: Cooperative, alert, well developed, in no acute distress. HEENT: Conjunctivae and lids unremarkable. Cardiovascular: Regular rhythm.  Lungs: Normal work of breathing. Neurologic: No focal deficits.   Lab Results  Component Value Date   CREATININE 0.94 10/21/2019   BUN 24 (H) 10/21/2019   NA 139 10/21/2019  K 4.1 10/21/2019   CL 98 10/21/2019   CO2 32 10/21/2019   Lab Results  Component Value Date   ALT 23 10/21/2019   AST 20 10/21/2019   ALKPHOS 71 10/21/2019   BILITOT 0.6 10/21/2019   Lab Results  Component Value Date   HGBA1C 6.4  (H) 07/21/2019   HGBA1C 6.6 (H) 10/12/2017   HGBA1C 6.4 06/05/2017   HGBA1C 6.5 03/05/2017   HGBA1C 6.6 (H) 12/01/2016   Lab Results  Component Value Date   INSULIN 35.9 (H) 07/21/2019   Lab Results  Component Value Date   TSH 0.807 07/21/2019   Lab Results  Component Value Date   CHOL 180 07/21/2019   HDL 50 07/21/2019   LDLCALC 97 07/21/2019   LDLDIRECT 110.0 08/21/2016   TRIG 193 (H) 07/21/2019   CHOLHDL 4 10/12/2017   Lab Results  Component Value Date   WBC 9.3 09/19/2017   HGB 12.1 09/19/2017   HCT 36.6 09/19/2017   MCV 86.5 09/19/2017   PLT 260.0 09/19/2017   No results found for: IRON, TIBC, FERRITIN  Obesity Behavioral Intervention Documentation for Insurance:   Approximately 15 minutes were spent on the discussion below.  ASK: We discussed the diagnosis of obesity with [name] today and [name] agreed to give [name] permission to discuss obesity behavioral modification therapy today.  ASSESS: [name] has the diagnosis of obesity and her BMI today is 43.5. [name] is in the action stage of change.   ADVISE: [name] was educated on the multiple health risks of obesity as well as the benefit of weight loss to improve her health. She was advised of the need for long term treatment and the importance of lifestyle modifications to improve her current health and to decrease her risk of future health problems.  AGREE: Multiple dietary modification options and treatment options were discussed and [name] agreed to follow the recommendations documented in the above note.  ARRANGE: [name] was educated on the importance of frequent visits to treat obesity as outlined per CMS and USPSTF guidelines and agreed to schedule her next follow up appointment today.  Attestation Statements:   Reviewed by clinician on day of visit: allergies, medications, problem list, medical history, surgical history, family history, social history, and previous encounter notes.  [name], am acting as Location manager for [employer], DO   I have reviewed the above documentation for accuracy and completeness, and I agree with the above. [name], DO

## 2019-11-14 ENCOUNTER — Encounter (INDEPENDENT_AMBULATORY_CARE_PROVIDER_SITE_OTHER): Payer: Self-pay | Admitting: Bariatrics

## 2019-11-14 LAB — HEMOGLOBIN A1C
Est. average glucose Bld gHb Est-mCnc: 126 mg/dL
Hgb A1c MFr Bld: 6 % — ABNORMAL HIGH (ref 4.8–5.6)

## 2019-11-14 LAB — VITAMIN D 25 HYDROXY (VIT D DEFICIENCY, FRACTURES): Vit D, 25-Hydroxy: 53.3 ng/mL (ref 30.0–100.0)

## 2019-11-14 LAB — INSULIN, RANDOM: INSULIN: 12.9 u[IU]/mL (ref 2.6–24.9)

## 2019-11-17 ENCOUNTER — Encounter (INDEPENDENT_AMBULATORY_CARE_PROVIDER_SITE_OTHER): Payer: Self-pay | Admitting: Bariatrics

## 2019-11-17 DIAGNOSIS — M1711 Unilateral primary osteoarthritis, right knee: Secondary | ICD-10-CM | POA: Diagnosis not present

## 2019-11-24 ENCOUNTER — Encounter: Payer: Self-pay | Admitting: Family Medicine

## 2019-11-24 DIAGNOSIS — G4733 Obstructive sleep apnea (adult) (pediatric): Secondary | ICD-10-CM

## 2019-11-27 ENCOUNTER — Encounter (INDEPENDENT_AMBULATORY_CARE_PROVIDER_SITE_OTHER): Payer: Self-pay

## 2019-12-02 ENCOUNTER — Other Ambulatory Visit: Payer: Self-pay

## 2019-12-02 ENCOUNTER — Ambulatory Visit (INDEPENDENT_AMBULATORY_CARE_PROVIDER_SITE_OTHER): Payer: Medicare Other | Admitting: Bariatrics

## 2019-12-02 ENCOUNTER — Encounter (INDEPENDENT_AMBULATORY_CARE_PROVIDER_SITE_OTHER): Payer: Self-pay | Admitting: Bariatrics

## 2019-12-02 VITALS — BP 118/78 | HR 75 | Temp 98.6°F | Ht 66.0 in | Wt 267.0 lb

## 2019-12-02 DIAGNOSIS — Z6841 Body Mass Index (BMI) 40.0 and over, adult: Secondary | ICD-10-CM

## 2019-12-02 DIAGNOSIS — I10 Essential (primary) hypertension: Secondary | ICD-10-CM | POA: Diagnosis not present

## 2019-12-02 DIAGNOSIS — E559 Vitamin D deficiency, unspecified: Secondary | ICD-10-CM

## 2019-12-02 DIAGNOSIS — K5909 Other constipation: Secondary | ICD-10-CM

## 2019-12-02 NOTE — Progress Notes (Signed)
Chief Complaint:   OBESITY [name] is here to discuss her progress with her obesity treatment plan along with follow-up of her obesity related diagnoses. [name] is on the Category 4 Plan minus 100 calories and states she is following her eating plan approximately 80% of the time. [name] states she is swimming 60 minutes 1 time per week.  Today's visit was #: 8 Starting weight: 301 lbs Starting date: 07/21/2019 Today's weight: 267 lbs Today's date: 12/02/2019 Total lbs lost to date: 34 Total lbs lost since last in-office visit: 2  Interim History: [name] is down an additional 2 lbs and doing very well overall.  Subjective:   Essential hypertension. Sheron is taking HCTZ and Micardis. Blood pressure is controlled.  BP Readings from Last 3 Encounters:  12/02/19 118/78  11/13/19 120/83  10/22/19 100/70   Lab Results  Component Value Date   CREATININE 0.94 10/21/2019   CREATININE 0.88 07/21/2019   CREATININE 1.01 09/19/2017   Vitamin D deficiency. [name] is taking Vitamin D supplementation.    Ref. Range 11/13/2019 13:49  Vitamin D, 25-Hydroxy Latest Ref Range: 30.0 - 100.0 ng/mL 53.3   Other constipation. [name] reports having retained stool.  Assessment/Plan:   Essential hypertension. Mazy is working on healthy weight loss and exercise to improve blood pressure control. We will watch for signs of hypotension as she continues her lifestyle modifications. She will continue her medications as directed.  Vitamin D deficiency. Low Vitamin D level contributes to fatigue and are associated with obesity, breast, and colon cancer. She agrees to continue to take Vitamin D as directed and will follow-up for routine testing of Vitamin D, at least 2-3 times per year to avoid over-replacement.  Other constipation. [name] was informed that a decrease in bowel movement frequency is normal while losing weight, but stools should not be hard or painful. Orders and  follow up as documented in patient record. She will use glycerin suppositories as directed, will continue Benefiber and Metamucil, and will increase her water intake.  Counseling Getting to Good Bowel Health: Your goal is to have one soft bowel movement each day. Drink at least 8 glasses of water each day. Eat plenty of fiber (goal is over 25 grams each day). It is best to get most of your fiber from dietary sources which includes leafy green vegetables, fresh fruit, and whole grains. You may need to add fiber with the help of OTC fiber supplements. These include Metamucil, Citrucel, and Flaxseed. If you are still having trouble, try adding Miralax or Magnesium Citrate. If all of these changes do not work, Cabin crew.  Class 3 severe obesity with serious comorbidity and body mass index (BMI) of 40.0 to 44.9 in adult, unspecified obesity type ([place]).  [name] is currently in the action stage of change. As such, her goal is to continue with weight loss efforts. She has agreed to the Category 4 Plan minus 100 calories.   Exercise goals: [name] will continue swimming for 60 minutes 1 time per week.  Behavioral modification strategies: increasing lean protein intake, decreasing simple carbohydrates, increasing vegetables, increasing water intake, decreasing eating out, no skipping meals, meal planning and cooking strategies, keeping healthy foods in the home and planning for success.  [name] has agreed to follow-up with our clinic in 2 weeks. She was informed of the importance of frequent follow-up visits to maximize her success with intensive lifestyle modifications for her multiple health conditions.   Objective:   Blood pressure  118/78, pulse 75, temperature 98.6 F (37 C), height 5\' 6"  (1.676 m), weight 267 lb (121.1 kg), SpO2 97 %. Body mass index is 43.09 kg/m.  General: Cooperative, alert, well developed, in no acute distress. HEENT: Conjunctivae and lids  unremarkable. Cardiovascular: Regular rhythm.  Lungs: Normal work of breathing. Neurologic: No focal deficits.   Lab Results  Component Value Date   CREATININE 0.94 10/21/2019   BUN 24 (H) 10/21/2019   NA 139 10/21/2019   K 4.1 10/21/2019   CL 98 10/21/2019   CO2 32 10/21/2019   Lab Results  Component Value Date   ALT 23 10/21/2019   AST 20 10/21/2019   ALKPHOS 71 10/21/2019   BILITOT 0.6 10/21/2019   Lab Results  Component Value Date   HGBA1C 6.0 (H) 11/13/2019   HGBA1C 6.4 (H) 07/21/2019   HGBA1C 6.6 (H) 10/12/2017   HGBA1C 6.4 06/05/2017   HGBA1C 6.5 03/05/2017   Lab Results  Component Value Date   INSULIN 12.9 11/13/2019   INSULIN 35.9 (H) 07/21/2019   Lab Results  Component Value Date   TSH 0.807 07/21/2019   Lab Results  Component Value Date   CHOL 180 07/21/2019   HDL 50 07/21/2019   LDLCALC 97 07/21/2019   LDLDIRECT 110.0 08/21/2016   TRIG 193 (H) 07/21/2019   CHOLHDL 4 10/12/2017   Lab Results  Component Value Date   WBC 9.3 09/19/2017   HGB 12.1 09/19/2017   HCT 36.6 09/19/2017   MCV 86.5 09/19/2017   PLT 260.0 09/19/2017   No results found for: IRON, TIBC, FERRITIN  Obesity Behavioral Intervention Documentation for Insurance:   Approximately 15 minutes were spent on the discussion below.  ASK: We discussed the diagnosis of obesity with [name] today and [name] agreed to give [name] permission to discuss obesity behavioral modification therapy today.  ASSESS: [name] has the diagnosis of obesity and her BMI today is 43.2. [name] is in the action stage of change.   ADVISE: [name] was educated on the multiple health risks of obesity as well as the benefit of weight loss to improve her health. She was advised of the need for long term treatment and the importance of lifestyle modifications to improve her current health and to decrease her risk of future health problems.  AGREE: Multiple dietary modification options and treatment options  were discussed and [name] agreed to follow the recommendations documented in the above note.  ARRANGE: [name] was educated on the importance of frequent visits to treat obesity as outlined per CMS and USPSTF guidelines and agreed to schedule her next follow up appointment today.  Attestation Statements:   Reviewed by clinician on day of visit: allergies, medications, problem list, medical history, surgical history, family history, social history, and previous encounter notes.  [name], am acting as Location manager for [employer], DO   I have reviewed the above documentation for accuracy and completeness, and I agree with the above. [name], DO

## 2019-12-04 ENCOUNTER — Ambulatory Visit (INDEPENDENT_AMBULATORY_CARE_PROVIDER_SITE_OTHER): Payer: Medicare Other

## 2019-12-04 ENCOUNTER — Encounter: Payer: Self-pay | Admitting: Pulmonary Disease

## 2019-12-04 ENCOUNTER — Ambulatory Visit: Payer: Medicare Other | Admitting: Pulmonary Disease

## 2019-12-04 ENCOUNTER — Other Ambulatory Visit: Payer: Self-pay

## 2019-12-04 ENCOUNTER — Other Ambulatory Visit: Payer: Self-pay | Admitting: Pulmonary Disease

## 2019-12-04 VITALS — BP 122/70 | HR 75 | Temp 98.4°F | Ht 66.0 in | Wt 268.4 lb

## 2019-12-04 DIAGNOSIS — R059 Cough, unspecified: Secondary | ICD-10-CM

## 2019-12-04 DIAGNOSIS — R918 Other nonspecific abnormal finding of lung field: Secondary | ICD-10-CM | POA: Diagnosis not present

## 2019-12-04 DIAGNOSIS — R05 Cough: Secondary | ICD-10-CM | POA: Diagnosis not present

## 2019-12-04 DIAGNOSIS — U071 COVID-19: Secondary | ICD-10-CM

## 2019-12-04 DIAGNOSIS — G4733 Obstructive sleep apnea (adult) (pediatric): Secondary | ICD-10-CM

## 2019-12-04 DIAGNOSIS — R0602 Shortness of breath: Secondary | ICD-10-CM

## 2019-12-04 NOTE — Patient Instructions (Signed)
  Schedule home sleep test Based on this we will try to get you a new CPAP machine  Chest x-ray today  Congratulations on your weight loss!

## 2019-12-04 NOTE — Assessment & Plan Note (Signed)
She is very compliant with her CPAP machine and this is certainly helped improve her daytime somnolence and fatigue. She has lost 40 pounds since her original sleep study.  She would certainly be eligible for a new CPAP machine.  We will undertake a home sleep test to reassess severity of her OSA given her weight loss and based on this, get her a new CPAP machine We will set this to 8 cm, she requests a different DME from [facility].  Weight loss encouraged, compliance with goal of at least 4-6 hrs every night is the expectation. Advised against medications with sedative side effects Cautioned against driving when sleepy - understanding that sleepiness will vary on a day to day basis

## 2019-12-04 NOTE — Addendum Note (Signed)
Addended by: Edythe Clarity on: 12/04/2019 09:32 AM   Modules accepted: Orders

## 2019-12-04 NOTE — Progress Notes (Signed)
Subjective:    Patient ID: [name], female    DOB: Jan 16, 1956, 64 y.o.   MRN: [id number]  HPI  64 year old obese woman presents to reestablish care for OSA.  She was diagnosed in 2013 with moderate OSA and placed on CPAP with dramatic improvement in her daytime somnolence and fatigue she settled down with nasal pillows and has been very compliant since then, she was last seen in 2014, I reviewed those records including her original sleep study. She has lost 65 pounds from 308 pounds to her current weight of 268, she is planning for right total knee replacement and plans to lose more .  Her CPAP machine has started giving her an error message stating that motor has exceeded its lifespan, and she is worried, otherwise it is working okay.  Epworth Sleepiness Scale is 8 and she denies significant sleepiness.  Her husband has passed away and she is downsizing and therefore more fatigued due to the working well. Bedtime is between 10:11 PM, sleep latency about 30 minutes, she sleeps on her right with 1 pillow, reports 1 nocturnal awakening for nocturia and is out of bed by 7 AM with occasional dryness of mouth There is no history suggestive of cataplexy, sleep paralysis or parasomnias   PMH -hypertension, diabetes, "asthma" but she reports using albuterol MDI only for episodes of bronchitis.   She reports Covid infection in October which was uneventful however she does have a residual cough which is dry, no diurnal variation or seasonal variation.  No obvious heartburn or postnasal drip   Significant tests/ events reviewed  04/2012 NPSG >> mod OSA, 15/h with nadir desatn 71% corre ected by CPAP 8cm, small full face mask    Past Medical History:  Diagnosis Date  . Allergic rhinitis   . Allergy   . Anemia 07/17/2016  . Anxiety   . Asthma    seasonal  . Blood transfusion without reported diagnosis   . Breast cancer ([place])    right  . Bronchitis   . Colon polyps   . CPAP  (continuous positive airway pressure) dependence   . Depression   . Diabetes mellitus    borderline but takes metformin  . Edema   . Family history of breast cancer in first degree relative   . Fibromyalgia   . GERD (gastroesophageal reflux disease)   . Hyperglycemia 03/05/2017  . Hyperlipidemia 08/21/2016   no meds  . Hyperparathyroidism   . Hypertension    controlled by medications  . Joint pain   . Neuromuscular disorder ([facility])    Fibromyalgia  . Obesity   . Osteoarthritis    RA  . PONV (postoperative nausea and vomiting)    occasional  . Pre-diabetes   . Sleep apnea    wears cpap  . Viral meningitis   . Vitamin D deficiency     Past Surgical History:  Procedure Laterality Date  . ABDOMINAL HYSTERECTOMY  1998  . ADENOIDECTOMY    . BREAST EXCISIONAL BIOPSY Left 1993   benign  . BREAST SURGERY     Tran flap due to breast cancer  . CESAREAN SECTION  1988  . COLONOSCOPY  08/14/2011  . HAND SURGERY     dog bite, right hand  . HAND SURGERY     trauma, left hand  . KNEE ARTHROSCOPY     bilateral  . left knee replacement  2010  . MASTECTOMY  01/13/94   Right breast  . parathyroid resection    .  PARATHYROIDECTOMY    . POLYPECTOMY    . rectal abscess    . SEPTOPLASTY  1980  . SHOULDER ARTHROSCOPY Right 11/09/2015   Procedure: ARTHROSCOPY SHOULDER-acromioplasty, distal clavicle resection and debridement;  Surgeon: Melrose Nakayama, MD;  Location: [facility];  Service: Orthopedics;  Laterality: Right;  . shoulder arthroscopy     rotator cuff repair  . TOE SURGERY Right    paronychia and adenoma removed  . TONSILLECTOMY    . TOTAL KNEE ARTHROPLASTY  06/18/08   [facility]  . TUMOR REMOVAL  1982   , scalp    Allergies  Allergen Reactions  . Allopurinol Hives  . Mucinex [Guaifenesin Er] Shortness Of Breath  . Penicillins Hives and Other (See Comments)    Has patient had a PCN reaction causing immediate rash, facial/tongue/throat swelling, SOB or lightheadedness with  hypotension: no Has patient had a PCN reaction causing severe rash involving mucus membranes or skin necrosis: no Has patient had a PCN reaction that required hospitalization no Has patient had a PCN reaction occurring within the last 10 years: no If all of the above answers are "NO", then may proceed with Cephalosporin use.   . Prozac [Fluoxetine Hcl] Hives  . Amitriptyline Other (See Comments)    "felt weird, fatigue, dizziness"  . Lexapro [Escitalopram Oxalate]     Nausea and hypersalivation.     Social History   Socioeconomic History  . Marital status: Widowed    Spouse name: Not on file  . Number of children: 1  . Years of education: Not on file  . Highest education level: 12th grade  Occupational History  . Occupation: ACCOUNTING    Employer: [employer]  Tobacco Use  . Smoking status: Never Smoker  . Smokeless tobacco: Never Used  Vaping Use  . Vaping Use: Never used  Substance and Sexual Activity  . Alcohol use: Not Currently  . Drug use: No  . Sexual activity: Not on file  Other Topics Concern  . Not on file  Social History Narrative  . Not on file   Social Determinants of Health   Financial Resource Strain:   . Difficulty of Paying Living Expenses:   Food Insecurity:   . Worried About Charity fundraiser in the Last Year:   . Arboriculturist in the Last Year:   Transportation Needs:   . Film/video editor (Medical):   Marland Kitchen Lack of Transportation (Non-Medical):   Physical Activity:   . Days of Exercise per Week:   . Minutes of Exercise per Session:   Stress:   . Feeling of Stress :   Social Connections:   . Frequency of Communication with Friends and Family:   . Frequency of Social Gatherings with Friends and Family:   . Attends Religious Services:   . Active Member of Clubs or Organizations:   . Attends Archivist Meetings:   Marland Kitchen Marital Status:   Intimate Partner Violence:   . Fear of Current or Ex-Partner:   . Emotionally Abused:    Marland Kitchen Physically Abused:   . Sexually Abused:     Family History  Problem Relation Age of Onset  . Emphysema Father   . Lung cancer Father        lung ca dx 31  . Other Father        prostate issues  . Diabetes Father   . Breast cancer Maternal Aunt        dx late 11s  .  Colon cancer Maternal Aunt        dx 81s  . Colon polyps Maternal Aunt   . Prostate cancer Maternal Grandfather 85       d. 85y  . Heart disease Paternal Grandfather   . Heart attack Paternal Grandfather        d. 50y  . Diabetes Paternal Grandfather   . Asthma Daughter        "seasonal"  . Arthritis Maternal Grandmother   . Aneurysm Maternal Grandmother        d. brain aneurysm at 23  . Arthritis Mother   . Colon polyps Mother   . Hypertension Mother   . Sleep apnea Mother   . Multiple sclerosis Sister   . Breast cancer Sister 45       L IDC and DCIS; ER/PR+, Her2-  . Fibrocystic breast disease Sister   . Arthritis/Rheumatoid Sister   . Breast cancer Sister   . Cancer Maternal Uncle        d. mouth cancer at younger age; smoker  . Other Paternal Uncle        muscle issues - couldn't walk or talk; d. 20y  . Cancer Maternal Aunt        lymphoma, dx 69s  . Ovarian cancer Maternal Aunt        dx 35s; d. late 98s  . Lung cancer Maternal Uncle        d. 50y; former smoker  . Throat cancer Cousin        maternal 1st cousin; smoker  . Breast cancer Cousin 67       maternal 1st cousin  . Other Paternal Uncle        prostate issues  . Breast cancer Cousin        paternal 1st cousin dx late 70s  . Throat cancer Cousin        maternal 1st cousin; used SL tobacco  . Prostate cancer Cousin        maternal 1st cousin dx 39s  . Esophageal cancer Neg Hx   . Rectal cancer Neg Hx   . Stomach cancer Neg Hx      Review of Systems   Constitutional: negative for anorexia, fevers and sweats  Eyes: negative for irritation, redness and visual disturbance  Ears, nose, mouth, throat, and face: negative for  earaches, epistaxis, nasal congestion and sore throat  Respiratory: negative for dyspnea on exertion, sputum and wheezing  Cardiovascular: negative for chest pain, dyspnea, lower extremity edema, orthopnea, palpitations and syncope  Gastrointestinal: negative for abdominal pain, constipation, diarrhea, melena, nausea and vomiting  Genitourinary:negative for dysuria, frequency and hematuria  Hematologic/lymphatic: negative for bleeding, easy bruising and lymphadenopathy  Musculoskeletal:negative for arthralgias, muscle weakness and stiff joints    Neurological: negative for coordination problems, gait problems, headaches and weakness  Endocrine: negative for diabetic symptoms including polydipsia, polyuria and weight loss     Objective:   Physical Exam  Gen. Pleasant, obese, in no distress ENT - no lesions, no post nasal drip Neck: No JVD, no thyromegaly, no carotid bruits Lungs: no use of accessory muscles, no dullness to percussion, decreased without rales or rhonchi  Cardiovascular: Rhythm regular, heart sounds  normal, no murmurs or gallops, no peripheral edema Musculoskeletal: No deformities, no cyanosis or clubbing , no tremors       Assessment & Plan:

## 2019-12-04 NOTE — Assessment & Plan Note (Addendum)
We will assess chest x-ray to rule out post Covid scarring as a cause for her residual cough. Other differential includes postnasal drip and reflux Does not seem to be cough variant asthma  Addendum -chest x-ray shows bilateral interstitial coarsening , proceed with high-resolution CT chest without contrast to clarify

## 2019-12-10 ENCOUNTER — Other Ambulatory Visit: Payer: Self-pay

## 2019-12-10 ENCOUNTER — Ambulatory Visit (HOSPITAL_BASED_OUTPATIENT_CLINIC_OR_DEPARTMENT_OTHER)
Admission: RE | Admit: 2019-12-10 | Discharge: 2019-12-10 | Disposition: A | Discharge status: Home / Self Care | Payer: Medicare Other | Source: Ambulatory Visit | Attending: Pulmonary Disease | Admitting: Pulmonary Disease

## 2019-12-10 ENCOUNTER — Other Ambulatory Visit: Payer: Self-pay | Admitting: Pulmonary Disease

## 2019-12-10 ENCOUNTER — Other Ambulatory Visit: Payer: Self-pay | Admitting: Family Medicine

## 2019-12-10 DIAGNOSIS — R0602 Shortness of breath: Secondary | ICD-10-CM | POA: Diagnosis not present

## 2019-12-18 ENCOUNTER — Encounter (INDEPENDENT_AMBULATORY_CARE_PROVIDER_SITE_OTHER): Payer: Self-pay | Admitting: Bariatrics

## 2019-12-18 ENCOUNTER — Other Ambulatory Visit: Payer: Self-pay

## 2019-12-18 ENCOUNTER — Ambulatory Visit (INDEPENDENT_AMBULATORY_CARE_PROVIDER_SITE_OTHER): Payer: Medicare Other | Admitting: Bariatrics

## 2019-12-18 VITALS — BP 113/71 | HR 94 | Temp 98.7°F | Ht 66.0 in | Wt 262.0 lb

## 2019-12-18 DIAGNOSIS — I10 Essential (primary) hypertension: Secondary | ICD-10-CM | POA: Diagnosis not present

## 2019-12-18 DIAGNOSIS — Z6841 Body Mass Index (BMI) 40.0 and over, adult: Secondary | ICD-10-CM | POA: Diagnosis not present

## 2019-12-18 DIAGNOSIS — E559 Vitamin D deficiency, unspecified: Secondary | ICD-10-CM

## 2019-12-18 MED ORDER — VITAMIN D (ERGOCALCIFEROL) 1.25 MG (50000 UNIT) PO CAPS: 50000.0000 [IU] | ORAL_CAPSULE | ORAL | 0 refills | Status: DC

## 2019-12-18 NOTE — Progress Notes (Signed)
Chief Complaint:   OBESITY Morning [name] is here to discuss her progress with her obesity treatment plan along with follow-up of her obesity related diagnoses. [name] is on the Category 4 Plan minus 100 calories and states she is following her eating plan approximately 60% of the time. [name] states she is exercising 0 minutes 0 times per week.  Today's visit was #: 9 Starting weight: 301 lbs Starting date: 07/21/2019 Today's weight: 262 lbs Today's date: 12/18/2019 Total lbs lost to date: 39 Total lbs lost since last in-office visit: 5  Interim History: [name] is down 5 lbs and doing well overall. She has increased her water intake. She has been under more stress.  Subjective:   Vitamin D deficiency. No nausea, vomiting, or muscle weakness.    Ref. Range 11/13/2019 13:49  Vitamin D, 25-Hydroxy Latest Ref Range: 30.0 - 100.0 ng/mL 53.3   Essential hypertension. [name] is taking HCTZ and Micardis. Blood pressure is controlled.  BP Readings from Last 3 Encounters:  12/18/19 113/71  12/04/19 122/70  12/02/19 118/78   Lab Results  Component Value Date   CREATININE 0.94 10/21/2019   CREATININE 0.88 07/21/2019   CREATININE 1.01 09/19/2017   Assessment/Plan:   Vitamin D deficiency. Low Vitamin D level contributes to fatigue and are associated with obesity, breast, and colon cancer. She was given a prescription for Vitamin D, Ergocalciferol, (DRISDOL) 1.25 MG (50000 UNIT) CAPS capsule every week #4 with 0 refills and will follow-up for routine testing of Vitamin D, at least 2-3 times per year to avoid over-replacement.   Essential hypertension. [name] is working on healthy weight loss and exercise to improve blood pressure control. We will watch for signs of hypotension as she continues her lifestyle modifications. She will continue her medications as directed.   Class 3 severe obesity with serious comorbidity and body mass index (BMI) of 40.0 to 44.9 in adult,  unspecified obesity type ([place]).  [name] is currently in the action stage of change. As such, her goal is to continue with weight loss efforts. She has agreed to the Category 4 Plan minus 100 calories.  She will work on meal planning and intentional eating.    Exercise goals: [name] will increase her activities.  Behavioral modification strategies: increasing lean protein intake, decreasing simple carbohydrates, increasing vegetables, increasing water intake, decreasing eating out, no skipping meals, meal planning and cooking strategies, keeping healthy foods in the home and planning for success.  [name] has agreed to follow-up with our clinic in 2 weeks. She was informed of the importance of frequent follow-up visits to maximize her success with intensive lifestyle modifications for her multiple health conditions.   Objective:   Blood pressure 113/71, pulse 94, temperature 98.7 F (37.1 C), height 5\' 6"  (1.676 m), weight 262 lb (118.8 kg). Body mass index is 42.29 kg/m.  General: Cooperative, alert, well developed, in no acute distress. HEENT: Conjunctivae and lids unremarkable. Cardiovascular: Regular rhythm.  Lungs: Normal work of breathing. Neurologic: No focal deficits.   Lab Results  Component Value Date   CREATININE 0.94 10/21/2019   BUN 24 (H) 10/21/2019   NA 139 10/21/2019   K 4.1 10/21/2019   CL 98 10/21/2019   CO2 32 10/21/2019   Lab Results  Component Value Date   ALT 23 10/21/2019   AST 20 10/21/2019   ALKPHOS 71 10/21/2019   BILITOT 0.6 10/21/2019   Lab Results  Component Value Date   HGBA1C 6.0 (H) 11/13/2019  HGBA1C 6.4 (H) 07/21/2019   HGBA1C 6.6 (H) 10/12/2017   HGBA1C 6.4 06/05/2017   HGBA1C 6.5 03/05/2017   Lab Results  Component Value Date   INSULIN 12.9 11/13/2019   INSULIN 35.9 (H) 07/21/2019   Lab Results  Component Value Date   TSH 0.807 07/21/2019   Lab Results  Component Value Date   CHOL 180 07/21/2019   HDL 50 07/21/2019    LDLCALC 97 07/21/2019   LDLDIRECT 110.0 08/21/2016   TRIG 193 (H) 07/21/2019   CHOLHDL 4 10/12/2017   Lab Results  Component Value Date   WBC 9.3 09/19/2017   HGB 12.1 09/19/2017   HCT 36.6 09/19/2017   MCV 86.5 09/19/2017   PLT 260.0 09/19/2017   No results found for: IRON, TIBC, FERRITIN  Obesity Behavioral Intervention Documentation for Insurance:   Approximately 15 minutes were spent on the discussion below.  ASK: We discussed the diagnosis of obesity with [name] today and [name] agreed to give [name] permission to discuss obesity behavioral modification therapy today.  ASSESS: [name] has the diagnosis of obesity and her BMI today is 42.4. [name] is in the action stage of change.   ADVISE: [name] was educated on the multiple health risks of obesity as well as the benefit of weight loss to improve her health. She was advised of the need for long term treatment and the importance of lifestyle modifications to improve her current health and to decrease her risk of future health problems.  AGREE: Multiple dietary modification options and treatment options were discussed and [name] agreed to follow the recommendations documented in the above note.  ARRANGE: [name] was educated on the importance of frequent visits to treat obesity as outlined per CMS and USPSTF guidelines and agreed to schedule her next follow up appointment today.  Attestation Statements:   Reviewed by clinician on day of visit: allergies, medications, problem list, medical history, surgical history, family history, social history, and previous encounter notes.  [name], am acting as Location manager for [employer], DO   I have reviewed the above documentation for accuracy and completeness, and I agree with the above. [name], DO

## 2019-12-24 ENCOUNTER — Encounter: Payer: Self-pay | Admitting: Family Medicine

## 2019-12-24 ENCOUNTER — Other Ambulatory Visit: Payer: Self-pay | Admitting: Family Medicine

## 2019-12-24 MED ORDER — SERTRALINE HCL 50 MG PO TABS
ORAL_TABLET | ORAL | 3 refills | Status: DC
Start: 2019-12-24 — End: 2020-04-19

## 2020-01-06 ENCOUNTER — Ambulatory Visit: Payer: Medicare Other

## 2020-01-06 ENCOUNTER — Other Ambulatory Visit: Payer: Self-pay

## 2020-01-06 DIAGNOSIS — G4733 Obstructive sleep apnea (adult) (pediatric): Secondary | ICD-10-CM

## 2020-01-08 ENCOUNTER — Encounter (INDEPENDENT_AMBULATORY_CARE_PROVIDER_SITE_OTHER): Payer: Self-pay | Admitting: Bariatrics

## 2020-01-08 ENCOUNTER — Ambulatory Visit (INDEPENDENT_AMBULATORY_CARE_PROVIDER_SITE_OTHER): Payer: Medicare Other | Admitting: Bariatrics

## 2020-01-08 ENCOUNTER — Other Ambulatory Visit: Payer: Self-pay

## 2020-01-08 VITALS — BP 104/74 | HR 64 | Temp 98.6°F | Ht 66.0 in | Wt 254.0 lb

## 2020-01-08 DIAGNOSIS — E1169 Type 2 diabetes mellitus with other specified complication: Secondary | ICD-10-CM | POA: Diagnosis not present

## 2020-01-08 DIAGNOSIS — E669 Obesity, unspecified: Secondary | ICD-10-CM | POA: Diagnosis not present

## 2020-01-08 DIAGNOSIS — E559 Vitamin D deficiency, unspecified: Secondary | ICD-10-CM | POA: Diagnosis not present

## 2020-01-08 DIAGNOSIS — Z6841 Body Mass Index (BMI) 40.0 and over, adult: Secondary | ICD-10-CM

## 2020-01-08 MED ORDER — VITAMIN D (ERGOCALCIFEROL) 1.25 MG (50000 UNIT) PO CAPS: 50000.0000 [IU] | ORAL_CAPSULE | ORAL | 0 refills | Status: DC

## 2020-01-13 ENCOUNTER — Encounter (INDEPENDENT_AMBULATORY_CARE_PROVIDER_SITE_OTHER): Payer: Self-pay | Admitting: Bariatrics

## 2020-01-13 NOTE — Progress Notes (Signed)
Chief Complaint:   OBESITY [name] is here to discuss her progress with her obesity treatment plan along with follow-up of her obesity related diagnoses. Stavroula is on the Category 4 Plan minus 100 calories and states she is following her eating plan approximately 60% of the time. Babita states she is swimming 60 minutes 1 time per week.  Today's visit was #: 10 Starting weight: 301 lbs Starting date: 07/21/2019 Today's weight: 254 lbs Today's date: 01/08/2020 Total lbs lost to date: 47 Total lbs lost since last in-office visit: 8  Interim History: [name] is down 8 lbs. Her PCP put her on Zoloft. She is eating less and reports getting adequate protein. She needs to get her BMI below 40 for knee replacement surgery.  Subjective:   Vitamin D deficiency. No nausea, vomiting, or muscle weakness.    Ref. Range 11/13/2019 13:49  Vitamin D, 25-Hydroxy Latest Ref Range: 30.0 - 100.0 ng/mL 53.3   Type 2 diabetes mellitus with obesity ([place]). [name] is taking metformin.   Lab Results  Component Value Date   HGBA1C 6.0 (H) 11/13/2019   HGBA1C 6.4 (H) 07/21/2019   HGBA1C 6.6 (H) 10/12/2017   Lab Results  Component Value Date   LDLCALC 97 07/21/2019   CREATININE 0.94 10/21/2019   Lab Results  Component Value Date   INSULIN 12.9 11/13/2019   INSULIN 35.9 (H) 07/21/2019   Assessment/Plan:   Vitamin D deficiency. Low Vitamin D level contributes to fatigue and are associated with obesity, breast, and colon cancer. She was given a prescription for Vitamin D, Ergocalciferol, (DRISDOL) 1.25 MG (50000 UNIT) CAPS capsule every week #4 with 0 refills and will follow-up for routine testing of Vitamin D, at least 2-3 times per year to avoid over-replacement.   Type 2 diabetes mellitus with obesity ([place]). Good blood sugar control is important to decrease the likelihood of diabetic complications such as nephropathy, neuropathy, limb loss, blindness, coronary artery disease, and  death. Intensive lifestyle modification including diet, exercise and weight loss are the first line of treatment for diabetes. [name] will continue her medication as directed.   Class 3 severe obesity with serious comorbidity and body mass index (BMI) of 40.0 to 44.9 in adult, unspecified obesity type ([place]).  [name] is currently in the action stage of change. As such, her goal is to continue with weight loss efforts. She has agreed to the Category 4 Plan minus 100 calories.   She will work on meal planning, intentional eating, and increasing her protein intake.   Exercise goals: [name] will continue her current exercise regimen.   Behavioral modification strategies: increasing lean protein intake, decreasing simple carbohydrates, increasing vegetables, increasing water intake, decreasing eating out, no skipping meals, meal planning and cooking strategies, keeping healthy foods in the home and planning for success.  [name] has agreed to follow-up with our clinic in 2-3 weeks. She was informed of the importance of frequent follow-up visits to maximize her success with intensive lifestyle modifications for her multiple health conditions.   Objective:   Blood pressure 104/74, pulse 64, temperature 98.6 F (37 C), height 5\' 6"  (1.676 m), weight 254 lb (115.2 kg), SpO2 98 %. Body mass index is 41 kg/m.  General: Cooperative, alert, well developed, in no acute distress. HEENT: Conjunctivae and lids unremarkable. Cardiovascular: Regular rhythm.  Lungs: Normal work of breathing. Neurologic: No focal deficits.   Lab Results  Component Value Date   CREATININE 0.94 10/21/2019   BUN 24 (H) 10/21/2019  NA 139 10/21/2019   K 4.1 10/21/2019   CL 98 10/21/2019   CO2 32 10/21/2019   Lab Results  Component Value Date   ALT 23 10/21/2019   AST 20 10/21/2019   ALKPHOS 71 10/21/2019   BILITOT 0.6 10/21/2019   Lab Results  Component Value Date   HGBA1C 6.0 (H) 11/13/2019   HGBA1C 6.4 (H)  07/21/2019   HGBA1C 6.6 (H) 10/12/2017   HGBA1C 6.4 06/05/2017   HGBA1C 6.5 03/05/2017   Lab Results  Component Value Date   INSULIN 12.9 11/13/2019   INSULIN 35.9 (H) 07/21/2019   Lab Results  Component Value Date   TSH 0.807 07/21/2019   Lab Results  Component Value Date   CHOL 180 07/21/2019   HDL 50 07/21/2019   LDLCALC 97 07/21/2019   LDLDIRECT 110.0 08/21/2016   TRIG 193 (H) 07/21/2019   CHOLHDL 4 10/12/2017   Lab Results  Component Value Date   WBC 9.3 09/19/2017   HGB 12.1 09/19/2017   HCT 36.6 09/19/2017   MCV 86.5 09/19/2017   PLT 260.0 09/19/2017   No results found for: IRON, TIBC, FERRITIN  Obesity Behavioral Intervention Documentation for Insurance:   Approximately 15 minutes were spent on the discussion below.  ASK: We discussed the diagnosis of obesity with [name] today and [name] agreed to give [name] permission to discuss obesity behavioral modification therapy today.  ASSESS: [name] has the diagnosis of obesity and her BMI today is 41.1. [name] is in the action stage of change.   ADVISE: [name] was educated on the multiple health risks of obesity as well as the benefit of weight loss to improve her health. She was advised of the need for long term treatment and the importance of lifestyle modifications to improve her current health and to decrease her risk of future health problems.  AGREE: Multiple dietary modification options and treatment options were discussed and [name] agreed to follow the recommendations documented in the above note.  ARRANGE: [name] was educated on the importance of frequent visits to treat obesity as outlined per CMS and USPSTF guidelines and agreed to schedule her next follow up appointment today.  Attestation Statements:   Reviewed by clinician on day of visit: allergies, medications, problem list, medical history, surgical history, family history, social history, and previous encounter notes.  [name], am  acting as Location manager for [employer], DO   I have reviewed the above documentation for accuracy and completeness, and I agree with the above. Holly Lesch, DO

## 2020-01-16 ENCOUNTER — Encounter (INDEPENDENT_AMBULATORY_CARE_PROVIDER_SITE_OTHER): Payer: Self-pay | Admitting: Bariatrics

## 2020-01-19 ENCOUNTER — Other Ambulatory Visit (INDEPENDENT_AMBULATORY_CARE_PROVIDER_SITE_OTHER): Payer: Self-pay

## 2020-01-19 DIAGNOSIS — E559 Vitamin D deficiency, unspecified: Secondary | ICD-10-CM

## 2020-01-19 MED ORDER — VITAMIN D (ERGOCALCIFEROL) 1.25 MG (50000 UNIT) PO CAPS: 50000.0000 [IU] | ORAL_CAPSULE | ORAL | 0 refills | Status: DC

## 2020-01-21 ENCOUNTER — Telehealth: Payer: Self-pay | Admitting: Pulmonary Disease

## 2020-01-21 DIAGNOSIS — G4733 Obstructive sleep apnea (adult) (pediatric): Secondary | ICD-10-CM

## 2020-01-21 NOTE — Telephone Encounter (Signed)
HST shows moderate OSA, AHI 15/hour, worse in supine position. She needs to continue on CPAP. Please let her know and send prescription for new CPAP 8 cm to any DME other than [facility], per patient request

## 2020-01-26 ENCOUNTER — Other Ambulatory Visit: Payer: Self-pay | Admitting: Pulmonary Disease

## 2020-01-26 DIAGNOSIS — G4733 Obstructive sleep apnea (adult) (pediatric): Secondary | ICD-10-CM

## 2020-01-26 NOTE — Progress Notes (Signed)
cpap

## 2020-01-26 NOTE — Telephone Encounter (Signed)
Pt order has been placed in chart for new cpap setting it has been set to dme

## 2020-01-29 ENCOUNTER — Ambulatory Visit (INDEPENDENT_AMBULATORY_CARE_PROVIDER_SITE_OTHER): Payer: Medicare Other | Admitting: Bariatrics

## 2020-01-29 ENCOUNTER — Encounter (INDEPENDENT_AMBULATORY_CARE_PROVIDER_SITE_OTHER): Payer: Self-pay | Admitting: Bariatrics

## 2020-01-29 ENCOUNTER — Other Ambulatory Visit: Payer: Self-pay

## 2020-01-29 VITALS — BP 105/66 | HR 76 | Temp 98.5°F | Ht 66.0 in | Wt 251.0 lb

## 2020-01-29 DIAGNOSIS — Z6841 Body Mass Index (BMI) 40.0 and over, adult: Secondary | ICD-10-CM

## 2020-01-29 DIAGNOSIS — G4700 Insomnia, unspecified: Secondary | ICD-10-CM | POA: Diagnosis not present

## 2020-01-29 DIAGNOSIS — M797 Fibromyalgia: Secondary | ICD-10-CM | POA: Diagnosis not present

## 2020-01-29 DIAGNOSIS — E1169 Type 2 diabetes mellitus with other specified complication: Secondary | ICD-10-CM

## 2020-01-29 DIAGNOSIS — E669 Obesity, unspecified: Secondary | ICD-10-CM

## 2020-01-29 MED ORDER — TRAZODONE HCL 50 MG PO TABS: 25.0000 mg | ORAL_TABLET | Freq: Every evening | ORAL | 0 refills | Status: DC | PRN

## 2020-01-29 NOTE — Progress Notes (Signed)
Chief Complaint:   OBESITY [name] is here to discuss her progress with her obesity treatment plan along with follow-up of her obesity related diagnoses. [name] is on the Category 4 Plan and states she is following her eating plan approximately 80% of the time. [name] states she is swimming 60 minutes 2-3 times per week.  Today's visit was #: 11 Starting weight: 301 lbs Starting date: 07/21/2019 Today's weight: 251 lbs Today's date: 01/29/2020 Total lbs lost to date: 50 Total lbs lost since last in-office visit: 3  Interim History: [name] is down 3 lbs and has done very well overall. She reports having a lot of pain in the right knee.  Subjective:   Type 2 diabetes mellitus with obesity ([place]). [name] is taking metformin.  Lab Results  Component Value Date   HGBA1C 6.0 (H) 11/13/2019   HGBA1C 6.4 (H) 07/21/2019   HGBA1C 6.6 (H) 10/12/2017   Lab Results  Component Value Date   LDLCALC 97 07/21/2019   CREATININE 0.94 10/21/2019   Lab Results  Component Value Date   INSULIN 12.9 11/13/2019   INSULIN 35.9 (H) 07/21/2019   Fibromyalgia. [name] reports less pain with Vitamin D.  Insomnia, unspecified type. [name] is taking melatonin and is able to go to sleep, but is not staying asleep.  Assessment/Plan:   Type 2 diabetes mellitus with obesity ([place]). Good blood sugar control is important to decrease the likelihood of diabetic complications such as nephropathy, neuropathy, limb loss, blindness, coronary artery disease, and death. Intensive lifestyle modification including diet, exercise and weight loss are the first line of treatment for diabetes. [name] will continue metformin as directed.   Fibromyalgia. [name] will continue exercise/activities as tolerated.  Insomnia, unspecified type. The problem of recurrent insomnia was discussed. Orders and follow up as documented in patient record. Counseling: Intensive lifestyle modifications are the first line  treatment for this issue. We discussed several lifestyle modifications today and she will continue to work on diet, exercise and weight loss efforts. Prescription was given for traZODone (DESYREL) 50 MG tablet 0.5-1 tablet nightly #30 with 0 refills.  Counseling  Limit or avoid alcohol, caffeinated beverages, and cigarettes, especially close to bedtime.   Do not eat a large meal or eat spicy foods right before bedtime. This can lead to digestive discomfort that can make it hard for you to sleep.  Keep a sleep diary to help you and your health care provider figure out what could be causing your insomnia.  . Make your bedroom a dark, comfortable place where it is easy to fall asleep. ? Put up shades or blackout curtains to block light from outside. ? Use a white noise machine to block noise. ? Keep the temperature cool. . Limit screen use before bedtime. This includes: ? Watching TV. ? Using your smartphone, tablet, or computer. . Stick to a routine that includes going to bed and waking up at the same times every day and night. This can help you fall asleep faster. Consider making a quiet activity, such as reading, part of your nighttime routine. . Try to avoid taking naps during the day so that you sleep better at night. . Get out of bed if you are still awake after 15 minutes of trying to sleep. Keep the lights down, but try reading or doing a quiet activity. When you feel sleepy, go back to bed.  Class 3 severe obesity with serious comorbidity and body mass index (BMI) of 40.0 to 44.9  in adult, unspecified obesity type ([employer]).  [name] is currently in the action stage of change. As such, her goal is to continue with weight loss efforts. She has agreed to the Category 4 Plan.   She will work on meal planning and intentional eating.   Exercise goals: Older adults should follow the adult guidelines. When older adults cannot meet the adult guidelines, they should be as physically active as  their abilities and conditions will allow.   Behavioral modification strategies: increasing lean protein intake, decreasing simple carbohydrates, increasing vegetables, increasing water intake, decreasing eating out, no skipping meals, meal planning and cooking strategies, keeping healthy foods in the home and planning for success.  [name] has agreed to follow-up with our clinic in 2-3 weeks. She was informed of the importance of frequent follow-up visits to maximize her success with intensive lifestyle modifications for her multiple health conditions.   Objective:   Blood pressure 105/66, pulse 76, temperature 98.5 F (36.9 C), height 5\' 6"  (1.676 m), weight 251 lb (113.9 kg), SpO2 96 %. Body mass index is 40.51 kg/m.  General: Cooperative, alert, well developed, in no acute distress. HEENT: Conjunctivae and lids unremarkable. Cardiovascular: Regular rhythm.  Lungs: Normal work of breathing. Neurologic: No focal deficits.   Lab Results  Component Value Date   CREATININE 0.94 10/21/2019   BUN 24 (H) 10/21/2019   NA 139 10/21/2019   K 4.1 10/21/2019   CL 98 10/21/2019   CO2 32 10/21/2019   Lab Results  Component Value Date   ALT 23 10/21/2019   AST 20 10/21/2019   ALKPHOS 71 10/21/2019   BILITOT 0.6 10/21/2019   Lab Results  Component Value Date   HGBA1C 6.0 (H) 11/13/2019   HGBA1C 6.4 (H) 07/21/2019   HGBA1C 6.6 (H) 10/12/2017   HGBA1C 6.4 06/05/2017   HGBA1C 6.5 03/05/2017   Lab Results  Component Value Date   INSULIN 12.9 11/13/2019   INSULIN 35.9 (H) 07/21/2019   Lab Results  Component Value Date   TSH 0.807 07/21/2019   Lab Results  Component Value Date   CHOL 180 07/21/2019   HDL 50 07/21/2019   LDLCALC 97 07/21/2019   LDLDIRECT 110.0 08/21/2016   TRIG 193 (H) 07/21/2019   CHOLHDL 4 10/12/2017   Lab Results  Component Value Date   WBC 9.3 09/19/2017   HGB 12.1 09/19/2017   HCT 36.6 09/19/2017   MCV 86.5 09/19/2017   PLT 260.0 09/19/2017   No  results found for: IRON, TIBC, FERRITIN  Obesity Behavioral Intervention Documentation for Insurance:   Approximately 15 minutes were spent on the discussion below.  ASK: We discussed the diagnosis of obesity with [name] today and [name] agreed to give [name] permission to discuss obesity behavioral modification therapy today.  ASSESS: [name] has the diagnosis of obesity and her BMI today is 40.6. [name] is in the action stage of change.   ADVISE: [name] was educated on the multiple health risks of obesity as well as the benefit of weight loss to improve her health. She was advised of the need for long term treatment and the importance of lifestyle modifications to improve her current health and to decrease her risk of future health problems.  AGREE: Multiple dietary modification options and treatment options were discussed and [name] agreed to follow the recommendations documented in the above note.  ARRANGE: [name] was educated on the importance of frequent visits to treat obesity as outlined per CMS and USPSTF guidelines and agreed to schedule her  next follow up appointment today.  Attestation Statements:   Reviewed by clinician on day of visit: allergies, medications, problem list, medical history, surgical history, family history, social history, and previous encounter notes.  [name], am acting as Location manager for [employer], DO   I have reviewed the above documentation for accuracy and completeness, and I agree with the above. [name], DO

## 2020-02-03 ENCOUNTER — Encounter: Payer: Self-pay | Admitting: Family Medicine

## 2020-02-09 DIAGNOSIS — M1711 Unilateral primary osteoarthritis, right knee: Secondary | ICD-10-CM | POA: Diagnosis not present

## 2020-02-16 ENCOUNTER — Encounter (INDEPENDENT_AMBULATORY_CARE_PROVIDER_SITE_OTHER): Payer: Self-pay | Admitting: Bariatrics

## 2020-02-16 DIAGNOSIS — E559 Vitamin D deficiency, unspecified: Secondary | ICD-10-CM

## 2020-02-16 MED ORDER — VITAMIN D (ERGOCALCIFEROL) 1.25 MG (50000 UNIT) PO CAPS: 50000.0000 [IU] | ORAL_CAPSULE | ORAL | 0 refills | Status: DC

## 2020-02-18 NOTE — Telephone Encounter (Signed)
Patient checking status of CPAP order. Would like to use [facility]. Patient phone number is [phone].

## 2020-02-19 ENCOUNTER — Ambulatory Visit (INDEPENDENT_AMBULATORY_CARE_PROVIDER_SITE_OTHER): Payer: Medicare Other | Admitting: Bariatrics

## 2020-02-19 ENCOUNTER — Encounter (INDEPENDENT_AMBULATORY_CARE_PROVIDER_SITE_OTHER): Payer: Self-pay | Admitting: Bariatrics

## 2020-02-19 ENCOUNTER — Other Ambulatory Visit: Payer: Self-pay

## 2020-02-19 VITALS — BP 110/75 | HR 66 | Temp 98.4°F | Ht 66.0 in | Wt 252.0 lb

## 2020-02-19 DIAGNOSIS — Z6841 Body Mass Index (BMI) 40.0 and over, adult: Secondary | ICD-10-CM | POA: Diagnosis not present

## 2020-02-19 DIAGNOSIS — E559 Vitamin D deficiency, unspecified: Secondary | ICD-10-CM | POA: Diagnosis not present

## 2020-02-19 DIAGNOSIS — I10 Essential (primary) hypertension: Secondary | ICD-10-CM

## 2020-02-19 NOTE — Telephone Encounter (Signed)
Order states to send to [facility] and that is where it was sent on 8/30 & confirmation was received from Lorenzo on 8/31.  This message states pt wants [facility].  Called pt to verify where order should be sent.  She states [facility] is fine but she hasn't heard from anyone.  I called [facility] & spoke to Tiffany.  She states order has been sent to repap dept & they will be calling pt.  She is going to call pt now to go over info with her.  I called pt & made her aware.  Nothing further needed.

## 2020-02-23 NOTE — Progress Notes (Signed)
Chief Complaint:   OBESITY [name] is here to discuss her progress with her obesity treatment plan along with follow-up of her obesity related diagnoses. [name] is on the Category 4 Plan and states she is following her eating plan approximately 80% of the time. [name] states she is exercising for 0 minutes 0 times per week.  Today's visit was #: 12 Starting weight: 301 lbs Starting date: 07/21/2019 Today's weight: 252 lbs Today's date: 02/19/2020 Total lbs lost to date: 49 lbs Total lbs lost since last in-office visit: 0  Interim History: [name] is up 1 pound but has done well overall.  Her total body weight is up about 3.5 pounds, but she has not been as active.  Subjective:   1. Essential hypertension Review: taking medications as instructed, no medication side effects noted, no chest pain on exertion, no dyspnea on exertion, no swelling of ankles.  Taking Microzide and Micardis.  BP Readings from Last 3 Encounters:  02/19/20 110/75  01/29/20 105/66  01/08/20 104/74   2. Vitamin D deficiency [name]'s Vitamin D level was 53.3 on 11/13/2019. She is currently taking prescription vitamin D 50,000 IU each week. She denies nausea, vomiting or muscle weakness.    Assessment/Plan:   1. Essential hypertension [name] is working on healthy weight loss and exercise to improve blood pressure control. We will watch for signs of hypotension as she continues her lifestyle modifications.  Continue medications.  2. Vitamin D deficiency Low Vitamin D level contributes to fatigue and are associated with obesity, breast, and colon cancer. She agrees to continue to take prescription Vitamin D @50 ,000 IU every week and will follow-up for routine testing of Vitamin D, at least 2-3 times per year to avoid over-replacement.    3. Class 3 severe obesity with serious comorbidity and body mass index (BMI) of 40.0 to 44.9 in adult, unspecified obesity type (HCC) [name] is currently in the action  stage of change. As such, her goal is to continue with weight loss efforts. She has agreed to the Category 3 Plan.   She will work on meal planning, intentional eating, and increasing activity.  No take out.  Exercise goals: Older adults should follow the adult guidelines. When older adults cannot meet the adult guidelines, they should be as physically active as their abilities and conditions will allow.  Older adults should do exercises that maintain or improve balance if they are at risk of falling.   Behavioral modification strategies: increasing lean protein intake, decreasing simple carbohydrates, increasing vegetables, increasing water intake, decreasing eating out, no skipping meals, meal planning and cooking strategies, keeping healthy foods in the home and planning for success.  [name] has agreed to follow-up with our clinic in 3 weeks. She was informed of the importance of frequent follow-up visits to maximize her success with intensive lifestyle modifications for her multiple health conditions.   Objective:   Blood pressure 110/75, pulse 66, temperature 98.4 F (36.9 C), height 5\' 6"  (1.676 m), weight 252 lb (114.3 kg), SpO2 96 %. Body mass index is 40.67 kg/m.  General: Cooperative, alert, well developed, in no acute distress. HEENT: Conjunctivae and lids unremarkable. Cardiovascular: Regular [name].  Lungs: Normal work of breathing. Neurologic: No focal deficits.   Lab Results  Component Value Date   CREATININE 0.94 10/21/2019   BUN 24 (H) 10/21/2019   NA 139 10/21/2019   K 4.1 10/21/2019   CL 98 10/21/2019   CO2 32 10/21/2019   Lab Results  Component  Value Date   ALT 23 10/21/2019   AST 20 10/21/2019   ALKPHOS 71 10/21/2019   BILITOT 0.6 10/21/2019   Lab Results  Component Value Date   HGBA1C 6.0 (H) 11/13/2019   HGBA1C 6.4 (H) 07/21/2019   HGBA1C 6.6 (H) 10/12/2017   HGBA1C 6.4 06/05/2017   HGBA1C 6.5 03/05/2017   Lab Results  Component Value Date    INSULIN 12.9 11/13/2019   INSULIN 35.9 (H) 07/21/2019   Lab Results  Component Value Date   TSH 0.807 07/21/2019   Lab Results  Component Value Date   CHOL 180 07/21/2019   HDL 50 07/21/2019   LDLCALC 97 07/21/2019   LDLDIRECT 110.0 08/21/2016   TRIG 193 (H) 07/21/2019   CHOLHDL 4 10/12/2017   Lab Results  Component Value Date   WBC 9.3 09/19/2017   HGB 12.1 09/19/2017   HCT 36.6 09/19/2017   MCV 86.5 09/19/2017   PLT 260.0 09/19/2017   Obesity Behavioral Intervention:   Approximately 15 minutes were spent on the discussion below.  ASK: We discussed the diagnosis of obesity with [name] today and [name] agreed to give [name] permission to discuss obesity behavioral modification therapy today.  ASSESS: [name] has the diagnosis of obesity and her BMI today is 40.7. [name] is in the action stage of change.   ADVISE: [name] was educated on the multiple health risks of obesity as well as the benefit of weight loss to improve her health. She was advised of the need for long term treatment and the importance of lifestyle modifications to improve her current health and to decrease her risk of future health problems.  AGREE: Multiple dietary modification options and treatment options were discussed and [name] agreed to follow the recommendations documented in the above note.  ARRANGE: [name] was educated on the importance of frequent visits to treat obesity as outlined per CMS and USPSTF guidelines and agreed to schedule her next follow up appointment today.  Attestation Statements:   Reviewed by clinician on day of visit: allergies, medications, problem list, medical history, surgical history, family history, social history, and previous encounter notes.  I, Water quality scientist, CMA, am acting as Location manager for [employer], DO  I have reviewed the above documentation for accuracy and completeness, and I agree with the above. [name], DO

## 2020-02-27 ENCOUNTER — Ambulatory Visit: Payer: Medicare Other

## 2020-03-11 ENCOUNTER — Ambulatory Visit (INDEPENDENT_AMBULATORY_CARE_PROVIDER_SITE_OTHER): Payer: Medicare Other | Admitting: Bariatrics

## 2020-03-11 ENCOUNTER — Other Ambulatory Visit: Payer: Self-pay

## 2020-03-11 ENCOUNTER — Encounter (INDEPENDENT_AMBULATORY_CARE_PROVIDER_SITE_OTHER): Payer: Self-pay | Admitting: Bariatrics

## 2020-03-11 VITALS — BP 114/77 | HR 66 | Temp 98.3°F | Ht 66.0 in | Wt 246.0 lb

## 2020-03-11 DIAGNOSIS — Z6839 Body mass index (BMI) 39.0-39.9, adult: Secondary | ICD-10-CM

## 2020-03-11 DIAGNOSIS — E559 Vitamin D deficiency, unspecified: Secondary | ICD-10-CM

## 2020-03-11 DIAGNOSIS — E669 Obesity, unspecified: Secondary | ICD-10-CM

## 2020-03-11 DIAGNOSIS — Z9189 Other specified personal risk factors, not elsewhere classified: Secondary | ICD-10-CM

## 2020-03-11 DIAGNOSIS — E1169 Type 2 diabetes mellitus with other specified complication: Secondary | ICD-10-CM

## 2020-03-15 ENCOUNTER — Encounter (INDEPENDENT_AMBULATORY_CARE_PROVIDER_SITE_OTHER): Payer: Self-pay | Admitting: Bariatrics

## 2020-03-15 NOTE — Progress Notes (Signed)
Chief Complaint:   OBESITY [name] is here to discuss her progress with her obesity treatment plan along with follow-up of her obesity related diagnoses. [name] is on the Category 4 Plan and states she is following her eating plan approximately 80% of the time. [name] states she is exercising 0 minutes 0 times per week.  Today's visit was #: 80 Starting weight: 301 lbs Starting date: 07/21/2019 Today's weight: 246 lbs Today's date: 03/11/2020 Total lbs lost to date: 13 Total lbs lost since last in-office visit: 6  Interim History: [name] is down an additional 6 lbs and doing very well overall. She is still in an apartment. She states she cannot do deli meat.  Subjective:   Type 2 diabetes mellitus with obesity ([place]). [name] is taking Glucophage.  Lab Results  Component Value Date   HGBA1C 6.0 (H) 11/13/2019   HGBA1C 6.4 (H) 07/21/2019   HGBA1C 6.6 (H) 10/12/2017   Lab Results  Component Value Date   LDLCALC 97 07/21/2019   CREATININE 0.94 10/21/2019   Lab Results  Component Value Date   INSULIN 12.9 11/13/2019   INSULIN 35.9 (H) 07/21/2019   Vitamin D deficiency. No nausea, vomiting, or muscle weakness.    Ref. Range 11/13/2019 13:49  Vitamin D, 25-Hydroxy Latest Ref Range: 30.0 - 100.0 ng/mL 53.3   At risk for hypoglycemia. [name] is at increased risk for hypoglycemia due to diabetes mellitus and dietary changes.  Assessment/Plan:   Type 2 diabetes mellitus with obesity ([place]). Good blood sugar control is important to decrease the likelihood of diabetic complications such as nephropathy, neuropathy, limb loss, blindness, coronary artery disease, and death. Intensive lifestyle modification including diet, exercise and weight loss are the first line of treatment for diabetes. Adelise will continue her medication as directed.   Vitamin D deficiency. Low Vitamin D level contributes to fatigue and are associated with obesity, breast, and colon cancer.  She agrees to continue to take prescription Vitamin D @50 ,000 IU every week #4 with 0 refills and will follow-up for routine testing of Vitamin D, at least 2-3 times per year to avoid over-replacement.  At risk for hypoglycemia. [name] was given approximately 15 minutes of counseling today regarding prevention of hypoglycemia. She was advised of symptoms of hypoglycemia. [name] was instructed to avoid skipping meals, eat regular protein rich meals and schedule low calorie snacks as needed.   Repetitive spaced learning was employed today to elicit superior memory formation and behavioral change.  Class 2 severe obesity with serious comorbidity and body mass index (BMI) of 39.0 to 39.9 in adult, unspecified obesity type ([place]).  [name] is currently in the action stage of change. As such, her goal is to continue with weight loss efforts. She has agreed to the Category 4 Plan.   She will work on meal planning and intentional eating.   Exercise goals: [name] will do arm weights for exercise.  Behavioral modification strategies: increasing lean protein intake, decreasing simple carbohydrates, increasing vegetables, increasing water intake, decreasing eating out, no skipping meals, meal planning and cooking strategies, keeping healthy foods in the home and planning for success.  [name] has agreed to follow-up with our clinic fasting in 3 weeks. She was informed of the importance of frequent follow-up visits to maximize her success with intensive lifestyle modifications for her multiple health conditions.   Objective:   Blood pressure 114/77, pulse 66, temperature 98.3 F (36.8 C), height 5\' 6"  (1.676 m), weight 246 lb (111.6 kg), SpO2  97 %. Body mass index is 39.71 kg/m.  General: Cooperative, alert, well developed, in no acute distress. HEENT: Conjunctivae and lids unremarkable. Cardiovascular: Regular rhythm.  Lungs: Normal work of breathing. Neurologic: No focal deficits.   Lab Results   Component Value Date   CREATININE 0.94 10/21/2019   BUN 24 (H) 10/21/2019   NA 139 10/21/2019   K 4.1 10/21/2019   CL 98 10/21/2019   CO2 32 10/21/2019   Lab Results  Component Value Date   ALT 23 10/21/2019   AST 20 10/21/2019   ALKPHOS 71 10/21/2019   BILITOT 0.6 10/21/2019   Lab Results  Component Value Date   HGBA1C 6.0 (H) 11/13/2019   HGBA1C 6.4 (H) 07/21/2019   HGBA1C 6.6 (H) 10/12/2017   HGBA1C 6.4 06/05/2017   HGBA1C 6.5 03/05/2017   Lab Results  Component Value Date   INSULIN 12.9 11/13/2019   INSULIN 35.9 (H) 07/21/2019   Lab Results  Component Value Date   TSH 0.807 07/21/2019   Lab Results  Component Value Date   CHOL 180 07/21/2019   HDL 50 07/21/2019   LDLCALC 97 07/21/2019   LDLDIRECT 110.0 08/21/2016   TRIG 193 (H) 07/21/2019   CHOLHDL 4 10/12/2017   Lab Results  Component Value Date   WBC 9.3 09/19/2017   HGB 12.1 09/19/2017   HCT 36.6 09/19/2017   MCV 86.5 09/19/2017   PLT 260.0 09/19/2017   No results found for: IRON, TIBC, FERRITIN  Attestation Statements:   Reviewed by clinician on day of visit: allergies, medications, problem list, medical history, surgical history, family history, social history, and previous encounter notes.  [name], am acting as Location manager for [employer], DO   I have reviewed the above documentation for accuracy and completeness, and I agree with the above. [name], DO

## 2020-03-23 ENCOUNTER — Other Ambulatory Visit (INDEPENDENT_AMBULATORY_CARE_PROVIDER_SITE_OTHER): Payer: Self-pay | Admitting: Bariatrics

## 2020-03-23 DIAGNOSIS — E559 Vitamin D deficiency, unspecified: Secondary | ICD-10-CM

## 2020-03-24 ENCOUNTER — Encounter (INDEPENDENT_AMBULATORY_CARE_PROVIDER_SITE_OTHER): Payer: Self-pay | Admitting: Bariatrics

## 2020-03-24 NOTE — Telephone Encounter (Signed)
Last seen by Dr. Brown. 

## 2020-03-24 NOTE — Telephone Encounter (Signed)
Pt saw Dr.Brown last 

## 2020-04-05 ENCOUNTER — Ambulatory Visit (INDEPENDENT_AMBULATORY_CARE_PROVIDER_SITE_OTHER): Payer: Medicare Other | Admitting: Bariatrics

## 2020-04-05 ENCOUNTER — Other Ambulatory Visit: Payer: Self-pay

## 2020-04-05 ENCOUNTER — Encounter (INDEPENDENT_AMBULATORY_CARE_PROVIDER_SITE_OTHER): Payer: Self-pay | Admitting: Bariatrics

## 2020-04-05 VITALS — BP 115/78 | HR 78 | Temp 98.6°F | Ht 66.0 in | Wt 247.0 lb

## 2020-04-05 DIAGNOSIS — I152 Hypertension secondary to endocrine disorders: Secondary | ICD-10-CM

## 2020-04-05 DIAGNOSIS — E559 Vitamin D deficiency, unspecified: Secondary | ICD-10-CM | POA: Diagnosis not present

## 2020-04-05 DIAGNOSIS — E1159 Type 2 diabetes mellitus with other circulatory complications: Secondary | ICD-10-CM | POA: Diagnosis not present

## 2020-04-05 DIAGNOSIS — E782 Mixed hyperlipidemia: Secondary | ICD-10-CM | POA: Diagnosis not present

## 2020-04-05 DIAGNOSIS — E1140 Type 2 diabetes mellitus with diabetic neuropathy, unspecified: Secondary | ICD-10-CM | POA: Diagnosis not present

## 2020-04-05 DIAGNOSIS — Z6841 Body Mass Index (BMI) 40.0 and over, adult: Secondary | ICD-10-CM

## 2020-04-05 DIAGNOSIS — E1169 Type 2 diabetes mellitus with other specified complication: Secondary | ICD-10-CM | POA: Diagnosis not present

## 2020-04-05 DIAGNOSIS — E669 Obesity, unspecified: Secondary | ICD-10-CM

## 2020-04-05 NOTE — Progress Notes (Signed)
Chief Complaint:   OBESITY [name] is here to discuss her progress with her obesity treatment plan along with follow-up of her obesity related diagnoses. [name] is on the Category 4 Plan and states she is following her eating plan approximately 80% of the time. [name] states she is exercising 0 minutes 0 times per week.  Today's visit was #: 14 Starting weight: 301 lbs Starting date: 07/21/2019 Today's weight: 247 lbs Today's date: 04/05/2020 Total lbs lost to date: 72 Total lbs lost since last in-office visit: 0  Interim History: [name] is up 1 lb from her last visit. She does have edema in her feet. She states that she ate pizza this week.  Subjective:   Vitamin D deficiency. [name] is taking high dose Vitamin D supplementation.    Ref. Range 11/13/2019 13:49  Vitamin D, 25-Hydroxy Latest Ref Range: 30.0 - 100.0 ng/mL 53.3   Type 2 diabetes mellitus with obesity ([place]). [name] is taking Glucophage.   Lab Results  Component Value Date   HGBA1C 6.0 (H) 11/13/2019   HGBA1C 6.4 (H) 07/21/2019   HGBA1C 6.6 (H) 10/12/2017   Lab Results  Component Value Date   LDLCALC 97 07/21/2019   CREATININE 0.94 10/21/2019   Lab Results  Component Value Date   INSULIN 12.9 11/13/2019   INSULIN 35.9 (H) 07/21/2019   Hypertension associated with type 2 diabetes mellitus ([place]). [name] is taking Microzide and Micardis.  BP Readings from Last 3 Encounters:  04/05/20 115/78  03/11/20 114/77  02/19/20 110/75   Lab Results  Component Value Date   CREATININE 0.94 10/21/2019   CREATININE 0.88 07/21/2019   CREATININE 1.01 09/19/2017   Mixed hyperlipidemia. [name] is on no medication.   Lab Results  Component Value Date   CHOL 180 07/21/2019   HDL 50 07/21/2019   LDLCALC 97 07/21/2019   LDLDIRECT 110.0 08/21/2016   TRIG 193 (H) 07/21/2019   CHOLHDL 4 10/12/2017   Lab Results  Component Value Date   ALT 23 10/21/2019   AST 20 10/21/2019   ALKPHOS 71  10/21/2019   BILITOT 0.6 10/21/2019   The 10-year ASCVD risk score [name] DC Jr., et al., 2013) is: 11.2%   Values used to calculate the score:     Age: 64 years     Sex: Female     Is Non-Hispanic African American: No     Diabetic: Yes     Tobacco smoker: No     Systolic Blood Pressure: 017 mmHg     Is BP treated: Yes     HDL Cholesterol: 50 mg/dL     Total Cholesterol: 180 mg/dL  Assessment/Plan:   Vitamin D deficiency. Low Vitamin D level contributes to fatigue and are associated with obesity, breast, and colon cancer. She agrees to continue to take Vitamin D as directed and VITAMIN D 25 Hydroxy (Vit-D Deficiency, Fractures) level will be checked today.   Type 2 diabetes mellitus with obesity ([place]). Good blood sugar control is important to decrease the likelihood of diabetic complications such as nephropathy, neuropathy, limb loss, blindness, coronary artery disease, and death. Intensive lifestyle modification including diet, exercise and weight loss are the first line of treatment for diabetes. [name] will continue Glucophage and will increase to 500 mg 1 in a.m. and 1 in p.m. with meals Insulin, random, Hemoglobin A1c labs will be checked today.   Hypertension associated with type 2 diabetes mellitus ([place]). [name] is working on healthy weight loss and exercise to  improve blood pressure control. We will watch for signs of hypotension as she continues her lifestyle modifications. She will continue her medications as directed and will decrease her salt intake.  Mixed hyperlipidemia. Cardiovascular risk and specific lipid/LDL goals reviewed.  We discussed several lifestyle modifications today and [name] will continue to work on diet, exercise and weight loss efforts. Orders and follow up as documented in patient record. Comprehensive metabolic panel, Lipid Panel With LDL/HDL Ratio labs will be checked today.   Counseling Intensive lifestyle modifications are the first line treatment for  this issue. . Dietary changes: Increase soluble fiber. Decrease simple carbohydrates. . Exercise changes: Moderate to vigorous-intensity aerobic activity 150 minutes per week if tolerated. . Lipid-lowering medications: see documented in medical record.   Class 3 severe obesity with serious comorbidity and body mass index (BMI) of 40.0 to 44.9 in adult, unspecified obesity type ([place]).  [name] is currently in the action stage of change. As such, her goal is to continue with weight loss efforts. She has agreed to the Category 4 Plan.   She will work on meal planning and intentional eating.   Exercise goals: Older adults should follow the adult guidelines. When older adults cannot meet the adult guidelines, they should be as physically active as their abilities and conditions will allow.   Behavioral modification strategies: increasing lean protein intake, decreasing simple carbohydrates, increasing vegetables, increasing water intake, decreasing eating out, no skipping meals, meal planning and cooking strategies, keeping healthy foods in the home and planning for success.  [name] has agreed to follow-up with our clinic in 2-3 weeks. She was informed of the importance of frequent follow-up visits to maximize her success with intensive lifestyle modifications for her multiple health conditions.   Objective:   Blood pressure 115/78, pulse 78, temperature 98.6 F (37 C), temperature source Oral, height 5\' 6"  (1.676 m), weight 247 lb (112 kg), SpO2 99 %. Body mass index is 39.87 kg/m.  General: Cooperative, alert, well developed, in no acute distress. HEENT: Conjunctivae and lids unremarkable. Cardiovascular: Regular rhythm.  Lungs: Normal work of breathing. Neurologic: No focal deficits.   Lab Results  Component Value Date   CREATININE 0.94 10/21/2019   BUN 24 (H) 10/21/2019   NA 139 10/21/2019   K 4.1 10/21/2019   CL 98 10/21/2019   CO2 32 10/21/2019   Lab Results  Component Value  Date   ALT 23 10/21/2019   AST 20 10/21/2019   ALKPHOS 71 10/21/2019   BILITOT 0.6 10/21/2019   Lab Results  Component Value Date   HGBA1C 6.0 (H) 11/13/2019   HGBA1C 6.4 (H) 07/21/2019   HGBA1C 6.6 (H) 10/12/2017   HGBA1C 6.4 06/05/2017   HGBA1C 6.5 03/05/2017   Lab Results  Component Value Date   INSULIN 12.9 11/13/2019   INSULIN 35.9 (H) 07/21/2019   Lab Results  Component Value Date   TSH 0.807 07/21/2019   Lab Results  Component Value Date   CHOL 180 07/21/2019   HDL 50 07/21/2019   LDLCALC 97 07/21/2019   LDLDIRECT 110.0 08/21/2016   TRIG 193 (H) 07/21/2019   CHOLHDL 4 10/12/2017   Lab Results  Component Value Date   WBC 9.3 09/19/2017   HGB 12.1 09/19/2017   HCT 36.6 09/19/2017   MCV 86.5 09/19/2017   PLT 260.0 09/19/2017   No results found for: IRON, TIBC, FERRITIN  Obesity Behavioral Intervention:   Approximately 15 minutes were spent on the discussion below.  ASK: We discussed the diagnosis  of obesity with Raigan today and Christel agreed to give [name] permission to discuss obesity behavioral modification therapy today.  ASSESS: Leoni has the diagnosis of obesity and her BMI today is 40.0. Deborahann is in the action stage of change.   ADVISE: Samantha was educated on the multiple health risks of obesity as well as the benefit of weight loss to improve her health. She was advised of the need for long term treatment and the importance of lifestyle modifications to improve her current health and to decrease her risk of future health problems.  AGREE: Multiple dietary modification options and treatment options were discussed and Jacy agreed to follow the recommendations documented in the above note.  ARRANGE: Jameelah was educated on the importance of frequent visits to treat obesity as outlined per CMS and USPSTF guidelines and agreed to schedule her next follow up appointment today.  Attestation Statements:   Reviewed by clinician on day of visit:  allergies, medications, problem list, medical history, surgical history, family history, social history, and previous encounter notes.  [name], am acting as Location manager for [employer], DO   I have reviewed the above documentation for accuracy and completeness, and I agree with the above. [name], DO

## 2020-04-06 LAB — COMPREHENSIVE METABOLIC PANEL
ALT: 13 IU/L (ref 0–32)
AST: 14 IU/L (ref 0–40)
Albumin/Globulin Ratio: 1.6 (ref 1.2–2.2)
Albumin: 4.4 g/dL (ref 3.8–4.8)
Alkaline Phosphatase: 105 IU/L (ref 44–121)
BUN/Creatinine Ratio: 19 (ref 12–28)
BUN: 15 mg/dL (ref 8–27)
Bilirubin Total: 0.4 mg/dL (ref 0.0–1.2)
CO2: 30 mmol/L — ABNORMAL HIGH (ref 20–29)
Calcium: 9.9 mg/dL (ref 8.7–10.3)
Chloride: 99 mmol/L (ref 96–106)
Creatinine, Ser: 0.81 mg/dL (ref 0.57–1.00)
GFR calc Af Amer: 88 mL/min/{1.73_m2} (ref >59)
GFR calc non Af Amer: 76 mL/min/{1.73_m2} (ref >59)
Globulin, Total: 2.7 g/dL (ref 1.5–4.5)
Glucose: 111 mg/dL — ABNORMAL HIGH (ref 65–99)
Potassium: 4.6 mmol/L (ref 3.5–5.2)
Sodium: 140 mmol/L (ref 134–144)
Total Protein: 7.1 g/dL (ref 6.0–8.5)

## 2020-04-06 LAB — LIPID PANEL WITH LDL/HDL RATIO
Cholesterol, Total: 184 mg/dL (ref 100–199)
HDL: 49 mg/dL
LDL Chol Calc (NIH): 119 mg/dL — ABNORMAL HIGH (ref 0–99)
LDL/HDL Ratio: 2.4 ratio (ref 0.0–3.2)
Triglycerides: 90 mg/dL (ref 0–149)
VLDL Cholesterol Cal: 16 mg/dL (ref 5–40)

## 2020-04-06 LAB — VITAMIN D 25 HYDROXY (VIT D DEFICIENCY, FRACTURES): Vit D, 25-Hydroxy: 60.4 ng/mL (ref 30.0–100.0)

## 2020-04-06 LAB — HEMOGLOBIN A1C
Est. average glucose Bld gHb Est-mCnc: 131 mg/dL
Hgb A1c MFr Bld: 6.2 % — ABNORMAL HIGH (ref 4.8–5.6)

## 2020-04-06 LAB — INSULIN, RANDOM: INSULIN: 19.2 u[IU]/mL (ref 2.6–24.9)

## 2020-04-19 ENCOUNTER — Other Ambulatory Visit: Payer: Self-pay

## 2020-04-19 ENCOUNTER — Ambulatory Visit (INDEPENDENT_AMBULATORY_CARE_PROVIDER_SITE_OTHER): Payer: Medicare Other | Admitting: Family Medicine

## 2020-04-19 ENCOUNTER — Ambulatory Visit (INDEPENDENT_AMBULATORY_CARE_PROVIDER_SITE_OTHER): Payer: Medicare Other | Admitting: Bariatrics

## 2020-04-19 ENCOUNTER — Encounter: Payer: Self-pay | Admitting: Family Medicine

## 2020-04-19 VITALS — BP 130/72 | HR 93 | Temp 98.4°F | Resp 18 | Wt 254.0 lb

## 2020-04-19 DIAGNOSIS — R413 Other amnesia: Secondary | ICD-10-CM

## 2020-04-19 DIAGNOSIS — I10 Essential (primary) hypertension: Secondary | ICD-10-CM

## 2020-04-19 DIAGNOSIS — M109 Gout, unspecified: Secondary | ICD-10-CM

## 2020-04-19 DIAGNOSIS — G3184 Mild cognitive impairment, so stated: Secondary | ICD-10-CM

## 2020-04-19 DIAGNOSIS — G629 Polyneuropathy, unspecified: Secondary | ICD-10-CM | POA: Insufficient documentation

## 2020-04-19 DIAGNOSIS — M26622 Arthralgia of left temporomandibular joint: Secondary | ICD-10-CM

## 2020-04-19 DIAGNOSIS — E782 Mixed hyperlipidemia: Secondary | ICD-10-CM | POA: Diagnosis not present

## 2020-04-19 DIAGNOSIS — E1140 Type 2 diabetes mellitus with diabetic neuropathy, unspecified: Secondary | ICD-10-CM

## 2020-04-19 DIAGNOSIS — Z7189 Other specified counseling: Secondary | ICD-10-CM

## 2020-04-19 DIAGNOSIS — M26629 Arthralgia of temporomandibular joint, unspecified side: Secondary | ICD-10-CM

## 2020-04-19 DIAGNOSIS — G6289 Other specified polyneuropathies: Secondary | ICD-10-CM

## 2020-04-19 DIAGNOSIS — Z23 Encounter for immunization: Secondary | ICD-10-CM

## 2020-04-19 DIAGNOSIS — R42 Dizziness and giddiness: Secondary | ICD-10-CM | POA: Diagnosis not present

## 2020-04-19 DIAGNOSIS — G4700 Insomnia, unspecified: Secondary | ICD-10-CM

## 2020-04-19 DIAGNOSIS — F418 Other specified anxiety disorders: Secondary | ICD-10-CM

## 2020-04-19 DIAGNOSIS — E559 Vitamin D deficiency, unspecified: Secondary | ICD-10-CM

## 2020-04-19 HISTORY — DX: Other specified counseling: Z71.89

## 2020-04-19 HISTORY — DX: Other amnesia: R41.3

## 2020-04-19 HISTORY — DX: Polyneuropathy, unspecified: G62.9

## 2020-04-19 HISTORY — DX: Arthralgia of temporomandibular joint, unspecified side: M26.629

## 2020-04-19 MED ORDER — ATORVASTATIN CALCIUM 10 MG PO TABS: 10.0000 mg | ORAL_TABLET | ORAL | 3 refills | Status: DC

## 2020-04-19 MED ORDER — SERTRALINE HCL 50 MG PO TABS: 25.0000 mg | ORAL_TABLET | Freq: Every day | ORAL | 1 refills | Status: DC

## 2020-04-19 NOTE — Assessment & Plan Note (Addendum)
Encouraged heart healthy diet, increase exercise, avoid trans fats, consider a krill oil cap daily. Patient willing to try Atorvastatin 10 mg po twice weekly and reevaluate labs in 3 months

## 2020-04-19 NOTE — Assessment & Plan Note (Signed)
Supplement and monitor 

## 2020-04-19 NOTE — Assessment & Plan Note (Signed)
Did not tolerate Gabapentin which caused edema. Will try Trazadone qhs as the nights are when she is most bothered, has already tried up to 50 mg without side effects can try 75 and then 100 mg as needed.

## 2020-04-19 NOTE — Assessment & Plan Note (Signed)
Has recently tried Trazadone 25-50 mg without side effects but not helping sleep much. Can increase to 75 and then 100 mg as needed and as tolerated. If no response my consider trying Elavil at low doses again

## 2020-04-19 NOTE — Assessment & Plan Note (Signed)
Doing well with Sertraline 25 mg daily. She tried 50 but felt too tired so went back down to 25 mg daily. Given refill for 90 day supply

## 2020-04-19 NOTE — Patient Instructions (Signed)

## 2020-04-19 NOTE — Assessment & Plan Note (Signed)
Mild memory lapses like remembering to complete tasks. Consider Neuropsych eval is worsens

## 2020-04-19 NOTE — Assessment & Plan Note (Signed)
Had [place] in 1/21 then took [employer] shots which she feels made her sick so she has hesitant to take booster but she is going to take it.

## 2020-04-19 NOTE — Assessment & Plan Note (Signed)
Has to stand up slowly.and when she turns corner can feel off balance. Encouraged hydration and protein intake every 4 hours. Arise slowly

## 2020-04-19 NOTE — Assessment & Plan Note (Addendum)
Working with healthy weight and wellness. Had good results initially but has slowed down she continues to try

## 2020-04-19 NOTE — Assessment & Plan Note (Signed)
hgba1c acceptable, minimize simple carbs. Increase exercise as tolerated. Continue current meds. Continue Metformin 500 mg po bid 90 day supply

## 2020-04-19 NOTE — Progress Notes (Signed)
Subjective:    Patient ID: [name], female    DOB: Mar 12, 1956, 64 y.o.   MRN: [id number]  Chief Complaint  Patient presents with  . Follow-up    Pt would like her left ear looked at.    HPI Patient is in today for follow up on chronic medical concerns. No recent febrile illness or hospitalizations. She did not tolerate Gabapentin due to edema so is still struggling with insomnia contributed to by burning and pain in her feet. She is having pain over her left TMJ for several months. No injury or fall. She has lost a good deal of weight with healthy weight and wellness but has plateaued some and is frustrated with that. She notes much of her fatigue and aches and pains are much better as a result. Denies CP/palp/SOB/HA/congestion/fevers/GI or GU c/o. Taking meds as prescribed  Past Medical History:  Diagnosis Date  . Allergic rhinitis   . Allergy   . Anemia 07/17/2016  . Anxiety   . Asthma    seasonal  . Blood transfusion without reported diagnosis   . Breast cancer ([place])    right  . Bronchitis   . Colon polyps   . CPAP (continuous positive airway pressure) dependence   . Depression   . Diabetes mellitus    borderline but takes metformin  . Edema   . Family history of breast cancer in first degree relative   . Fibromyalgia   . GERD (gastroesophageal reflux disease)   . Hyperglycemia 03/05/2017  . Hyperlipidemia 08/21/2016   no meds  . Hyperparathyroidism   . Hypertension    controlled by medications  . Joint pain   . Neuromuscular disorder (HCC)    Fibromyalgia  . Obesity   . Osteoarthritis    RA  . PONV (postoperative nausea and vomiting)    occasional  . Pre-diabetes   . Sleep apnea    wears cpap  . Viral meningitis   . Vitamin D deficiency     Past Surgical History:  Procedure Laterality Date  . ABDOMINAL HYSTERECTOMY  1998  . ADENOIDECTOMY    . BREAST EXCISIONAL BIOPSY Left 1993   benign  . BREAST SURGERY     Tran flap due to breast cancer    . CESAREAN SECTION  1988  . COLONOSCOPY  08/14/2011  . HAND SURGERY     dog bite, right hand  . HAND SURGERY     trauma, left hand  . KNEE ARTHROSCOPY     bilateral  . left knee replacement  2010  . MASTECTOMY  01/13/94   Right breast  . parathyroid resection    . PARATHYROIDECTOMY    . POLYPECTOMY    . rectal abscess    . SEPTOPLASTY  1980  . SHOULDER ARTHROSCOPY Right 11/09/2015   Procedure: ARTHROSCOPY SHOULDER-acromioplasty, distal clavicle resection and debridement;  Surgeon: Melrose Nakayama, MD;  Location: [facility];  Service: Orthopedics;  Laterality: Right;  . shoulder arthroscopy     rotator cuff repair  . TOE SURGERY Right    paronychia and adenoma removed  . TONSILLECTOMY    . TOTAL KNEE ARTHROPLASTY  06/18/08   [facility]  . TUMOR REMOVAL  1982   , scalp    Family History  Problem Relation Age of Onset  . Emphysema Father   . Lung cancer Father        lung ca dx 67  . Other Father        prostate issues  .  Diabetes Father   . Breast cancer Maternal Aunt        dx late 76s  . Colon cancer Maternal Aunt        dx 74s  . Colon polyps Maternal Aunt   . Prostate cancer Maternal Grandfather 85       d. 85y  . Heart disease Paternal Grandfather   . Heart attack Paternal Grandfather        d. 50y  . Diabetes Paternal Grandfather   . Asthma Daughter        "seasonal"  . Arthritis Maternal Grandmother   . Aneurysm Maternal Grandmother        d. brain aneurysm at 34  . Arthritis Mother   . Colon polyps Mother   . Hypertension Mother   . Sleep apnea Mother   . Multiple sclerosis Sister   . Breast cancer Sister 92       L IDC and DCIS; ER/PR+, Her2-  . Fibrocystic breast disease Sister   . Arthritis/Rheumatoid Sister   . Breast cancer Sister   . Cancer Maternal Uncle        d. mouth cancer at younger age; smoker  . Other Paternal Uncle        muscle issues - couldn't walk or talk; d. 20y  . Cancer Maternal Aunt        lymphoma, dx 52s  . Ovarian cancer  Maternal Aunt        dx 68s; d. late 11s  . Lung cancer Maternal Uncle        d. 29y; former smoker  . Throat cancer Cousin        maternal 1st cousin; smoker  . Breast cancer Cousin 56       maternal 1st cousin  . Other Paternal Uncle        prostate issues  . Breast cancer Cousin        paternal 1st cousin dx late 32s  . Throat cancer Cousin        maternal 1st cousin; used SL tobacco  . Prostate cancer Cousin        maternal 1st cousin dx 9s  . Esophageal cancer Neg Hx   . Rectal cancer Neg Hx   . Stomach cancer Neg Hx     Social History   Socioeconomic History  . Marital status: Widowed    Spouse name: Not on file  . Number of children: 1  . Years of education: Not on file  . Highest education level: 12th grade  Occupational History  . Occupation: ACCOUNTING    Employer: [employer]  Tobacco Use  . Smoking status: Never Smoker  . Smokeless tobacco: Never Used  Vaping Use  . Vaping Use: Never used  Substance and Sexual Activity  . Alcohol use: Not Currently  . Drug use: No  . Sexual activity: Not on file  Other Topics Concern  . Not on file  Social History Narrative  . Not on file   Social Determinants of Health   Financial Resource Strain:   . Difficulty of Paying Living Expenses: Not on file  Food Insecurity:   . Worried About Charity fundraiser in the Last Year: Not on file  . Ran Out of Food in the Last Year: Not on file  Transportation Needs:   . Lack of Transportation (Medical): Not on file  . Lack of Transportation (Non-Medical): Not on file  Physical Activity:   . Days of Exercise  per Week: Not on file  . Minutes of Exercise per Session: Not on file  Stress:   . Feeling of Stress : Not on file  Social Connections:   . Frequency of Communication with Friends and Family: Not on file  . Frequency of Social Gatherings with Friends and Family: Not on file  . Attends Religious Services: Not on file  . Active Member of Clubs or Organizations:  Not on file  . Attends Archivist Meetings: Not on file  . Marital Status: Not on file  Intimate Partner Violence:   . Fear of Current or Ex-Partner: Not on file  . Emotionally Abused: Not on file  . Physically Abused: Not on file  . Sexually Abused: Not on file    Outpatient Medications Prior to Visit  Medication Sig Dispense Refill  . albuterol (VENTOLIN HFA) 108 (90 Base) MCG/ACT inhaler Inhale 2 puffs into the lungs every 6 (six) hours as needed. 18 g 0  . Calcium Carbonate (CALTRATE 600) 1500 MG TABS Take 1,500 mg by mouth daily.     . diclofenac (VOLTAREN) 75 MG EC tablet Take 1 tablet (75 mg total) by mouth 2 (two) times daily. 20 tablet 0  . famotidine (PEPCID) 40 MG tablet Take 1 tablet (40 mg total) by mouth daily. 30 tablet 5  . furosemide (LASIX) 40 MG tablet Take 1 tablet (40 mg total) by mouth 2 (two) times daily. 180 tablet 1  . glucosamine-chondroitin 500-400 MG tablet Take 2 tablets by mouth 2 (two) times daily.    . hydrochlorothiazide (MICROZIDE) 12.5 MG capsule Take 1 capsule (12.5 mg total) by mouth daily. 90 capsule 1  . metFORMIN (GLUCOPHAGE) 500 MG tablet Take 1 tablet (500 mg total) by mouth 2 (two) times daily with a meal. 180 tablet 1  . Multiple Vitamins-Minerals (MULTIVITAMIN & MINERAL PO) Take 1 tablet by mouth daily.    Marland Kitchen telmisartan (MICARDIS) 20 MG tablet TAKE 1 TABLET BY MOUTH  DAILY 90 tablet 3  . traZODone (DESYREL) 50 MG tablet Take 0.5-1 tablets (25-50 mg total) by mouth at bedtime as needed for sleep. 30 tablet 0  . Vitamin D, Ergocalciferol, (DRISDOL) 1.25 MG (50000 UNIT) CAPS capsule TAKE ONE CAPSULE BY MOUTH EVERY 7 DAYS 4 capsule 0  . Zinc 30 MG CAPS Take by mouth.    . sertraline (ZOLOFT) 50 MG tablet 1/2 tab po daily x 7 days and then increase to 1 tab po daily 30 tablet 3   No facility-administered medications prior to visit.    Allergies  Allergen Reactions  . Allopurinol Hives  . Mucinex [Guaifenesin Er] Shortness Of Breath    . Penicillins Hives and Other (See Comments)    Has patient had a PCN reaction causing immediate rash, facial/tongue/throat swelling, SOB or lightheadedness with hypotension: no Has patient had a PCN reaction causing severe rash involving mucus membranes or skin necrosis: no Has patient had a PCN reaction that required hospitalization no Has patient had a PCN reaction occurring within the last 10 years: no If all of the above answers are "NO", then may proceed with Cephalosporin use.   . Prozac [Fluoxetine Hcl] Hives  . Amitriptyline Other (See Comments)    "felt weird, fatigue, dizziness"  . Lexapro [Escitalopram Oxalate]     Nausea and hypersalivation.     Review of Systems  Constitutional: Positive for malaise/fatigue. Negative for fever.  HENT: Negative for congestion.   Eyes: Negative for blurred vision.  Respiratory: Negative for shortness  of breath.   Cardiovascular: Negative for chest pain, palpitations and leg swelling.  Gastrointestinal: Negative for abdominal pain, blood in stool and nausea.  Genitourinary: Negative for dysuria and frequency.  Musculoskeletal: Positive for back pain, joint pain and myalgias. Negative for falls.  Skin: Negative for rash.  Neurological: Positive for sensory change. Negative for dizziness, loss of consciousness and headaches.  Endo/Heme/Allergies: Negative for environmental allergies.  Psychiatric/Behavioral: Positive for depression.       Objective:    Physical Exam Vitals and nursing note reviewed.  Constitutional:      General: She is not in acute distress.    Appearance: She is well-developed.  HENT:     Head: Normocephalic and atraumatic.     Nose: Nose normal.  Eyes:     General:        Right eye: No discharge.        Left eye: No discharge.  Cardiovascular:     Rate and Rhythm: Normal rate and regular rhythm.     Heart sounds: No murmur heard.   Pulmonary:     Effort: Pulmonary effort is normal.     Breath sounds:  Normal breath sounds.  Abdominal:     General: Bowel sounds are normal.     Palpations: Abdomen is soft.     Tenderness: There is no abdominal tenderness.  Musculoskeletal:     Cervical back: Normal range of motion and neck supple.  Skin:    General: Skin is warm and dry.  Neurological:     Mental Status: She is alert and oriented to person, place, and time.     BP 130/72   Pulse 93   Temp 98.4 F (36.9 C) (Oral)   Resp 18   Wt 254 lb (115.2 kg)   SpO2 98%   BMI 41.00 kg/m  Wt Readings from Last 3 Encounters:  04/19/20 254 lb (115.2 kg)  04/05/20 247 lb (112 kg)  03/11/20 246 lb (111.6 kg)    Diabetic Foot Exam - Simple   Simple Foot Form Visual Inspection No deformities, no ulcerations, no other skin breakdown bilaterally: Yes Sensation Testing Intact to touch and monofilament testing bilaterally: Yes Pulse Check Posterior Tibialis and Dorsalis pulse intact bilaterally: Yes Comments    Lab Results  Component Value Date   WBC 9.3 09/19/2017   HGB 12.1 09/19/2017   HCT 36.6 09/19/2017   PLT 260.0 09/19/2017   GLUCOSE 111 (H) 04/05/2020   CHOL 184 04/05/2020   TRIG 90 04/05/2020   HDL 49 04/05/2020   LDLDIRECT 110.0 08/21/2016   LDLCALC 119 (H) 04/05/2020   ALT 13 04/05/2020   AST 14 04/05/2020   NA 140 04/05/2020   K 4.6 04/05/2020   CL 99 04/05/2020   CREATININE 0.81 04/05/2020   BUN 15 04/05/2020   CO2 30 (H) 04/05/2020   TSH 0.807 07/21/2019   INR 2.0 (H) 06/21/2008   HGBA1C 6.2 (H) 04/05/2020    Lab Results  Component Value Date   TSH 0.807 07/21/2019   Lab Results  Component Value Date   WBC 9.3 09/19/2017   HGB 12.1 09/19/2017   HCT 36.6 09/19/2017   MCV 86.5 09/19/2017   PLT 260.0 09/19/2017   Lab Results  Component Value Date   NA 140 04/05/2020   K 4.6 04/05/2020   CO2 30 (H) 04/05/2020   GLUCOSE 111 (H) 04/05/2020   BUN 15 04/05/2020   CREATININE 0.81 04/05/2020   BILITOT 0.4 04/05/2020   ALKPHOS 105  04/05/2020   AST 14  04/05/2020   ALT 13 04/05/2020   PROT 7.1 04/05/2020   ALBUMIN 4.4 04/05/2020   CALCIUM 9.9 04/05/2020   ANIONGAP 11 11/09/2015   GFR 59.72 (L) 10/21/2019   Lab Results  Component Value Date   CHOL 184 04/05/2020   Lab Results  Component Value Date   HDL 49 04/05/2020   Lab Results  Component Value Date   LDLCALC 119 (H) 04/05/2020   Lab Results  Component Value Date   TRIG 90 04/05/2020   Lab Results  Component Value Date   CHOLHDL 4 10/12/2017   Lab Results  Component Value Date   HGBA1C 6.2 (H) 04/05/2020       Assessment & Plan:   Problem List Items Addressed This Visit    Gout of big toe (Chronic)    No recent flare      Vitamin D deficiency    Supplement and monitor      Depression with anxiety    Doing well with Sertraline 25 mg daily. She tried 50 but felt too tired so went back down to 25 mg daily. Given refill for 90 day supply      Relevant Medications   sertraline (ZOLOFT) 50 MG tablet   Essential hypertension    Well controlled, no changes to meds. Encouraged heart healthy diet such as the DASH diet and exercise as tolerated.       Relevant Medications   atorvastatin (LIPITOR) 10 MG tablet   Insomnia    Has recently tried Trazadone 25-50 mg without side effects but not helping sleep much. Can increase to 75 and then 100 mg as needed and as tolerated. If no response my consider trying Elavil at low doses again      Morbid obesity ([place])    Working with healthy weight and wellness. Had good results initially but has slowed down she continues to try      Diabetes mellitus, type 2 (HCC)    hgba1c acceptable, minimize simple carbs. Increase exercise as tolerated. Continue current meds. Continue Metformin 500 mg po bid 90 day supply      Relevant Medications   atorvastatin (LIPITOR) 10 MG tablet   Hyperlipidemia    Encouraged heart healthy diet, increase exercise, avoid trans fats, consider a krill oil cap daily. Patient willing to try  Atorvastatin 10 mg po twice weekly and reevaluate labs in 3 months      Relevant Medications   atorvastatin (LIPITOR) 10 MG tablet   Vertigo    Has to stand up slowly.and when she turns corner can feel off balance. Encouraged hydration and protein intake every 4 hours. Arise slowly      Mild cognitive impairment    Mild memory lapses like remembering to complete tasks. Consider Neuropsych eval is worsens      Educated about COVID-19 virus infection    Had COVID in 1/21 then took [employer] shots which she feels made her sick so she has hesitant to take booster but she is going to take it.      Peripheral neuropathy    Did not tolerate Gabapentin which caused edema. Will try Trazadone qhs as the nights are when she is most bothered, has already tried up to 50 mg without side effects can try 75 and then 100 mg as needed.       Relevant Medications   sertraline (ZOLOFT) 50 MG tablet    Other Visit Diagnoses    Need for  Tdap vaccination    -  Primary   Relevant Orders   Tdap vaccine greater than or equal to 7yo IM (Completed)   Need for pneumococcal vaccination       Relevant Orders   Pneumococcal polysaccharide vaccine 23-valent greater than or equal to 2yo subcutaneous/IM (Completed)      I have changed Shanvi K. Arns's sertraline. I am also having her start on atorvastatin. Additionally, I am having her maintain her calcium carbonate, Multiple Vitamins-Minerals (MULTIVITAMIN & MINERAL PO), diclofenac, famotidine, furosemide, Zinc, glucosamine-chondroitin, metFORMIN, albuterol, telmisartan, hydrochlorothiazide, traZODone, and Vitamin D (Ergocalciferol).  Meds ordered this encounter  Medications  . atorvastatin (LIPITOR) 10 MG tablet    Sig: Take 1 tablet (10 mg total) by mouth 2 (two) times a week.    Dispense:  10 tablet    Refill:  3  . sertraline (ZOLOFT) 50 MG tablet    Sig: Take 0.5 tablets (25 mg total) by mouth daily. 1/2 tab po daily x 7 days and then increase to 1  tab po daily    Dispense:  45 tablet    Refill:  1    Patient has taken Sertraline previously and tolerated     Penni Homans, MD

## 2020-04-19 NOTE — Assessment & Plan Note (Signed)
Has notable discomfort anterior to left ear suspicious for TMJ. She is encouraged to discuss with her dentist and consider a bite guard and if persists can consider an xray of the joint followed by further treatment. Consider ice for 10 minutes followed by application of lidocaine gel daily prn

## 2020-04-19 NOTE — Assessment & Plan Note (Signed)
Well controlled, no changes to meds. Encouraged heart healthy diet such as the DASH diet and exercise as tolerated.  °

## 2020-04-19 NOTE — Assessment & Plan Note (Signed)
No recent flare.  

## 2020-04-22 ENCOUNTER — Other Ambulatory Visit: Payer: Self-pay | Admitting: Family Medicine

## 2020-04-26 ENCOUNTER — Other Ambulatory Visit: Payer: Self-pay

## 2020-04-26 ENCOUNTER — Encounter (INDEPENDENT_AMBULATORY_CARE_PROVIDER_SITE_OTHER): Payer: Self-pay | Admitting: Bariatrics

## 2020-04-26 ENCOUNTER — Ambulatory Visit (INDEPENDENT_AMBULATORY_CARE_PROVIDER_SITE_OTHER): Payer: Medicare Other | Admitting: Bariatrics

## 2020-04-26 VITALS — BP 116/76 | HR 74 | Temp 98.3°F | Ht 66.0 in | Wt 255.0 lb

## 2020-04-26 DIAGNOSIS — E1169 Type 2 diabetes mellitus with other specified complication: Secondary | ICD-10-CM

## 2020-04-26 DIAGNOSIS — Z6841 Body Mass Index (BMI) 40.0 and over, adult: Secondary | ICD-10-CM | POA: Diagnosis not present

## 2020-04-26 DIAGNOSIS — E559 Vitamin D deficiency, unspecified: Secondary | ICD-10-CM | POA: Diagnosis not present

## 2020-04-26 DIAGNOSIS — E782 Mixed hyperlipidemia: Secondary | ICD-10-CM

## 2020-04-26 NOTE — Progress Notes (Signed)
Chief Complaint:   OBESITY [name] is here to discuss her progress with her obesity treatment plan along with follow-up of her obesity related diagnoses. [name] is on the Category 4 Plan and states she is following her eating plan approximately 75% of the time. [name] states she is walking 30 minutes 2 times per week.  Today's visit was #: 15 Starting weight: 301 lbs Starting date: 07/21/2019 Today's weight: 255 lbs Today's date: 04/26/2020 Total lbs lost to date: 46 Total lbs lost since last in-office visit: 0  Interim History: [name] is up 1 lb, but 7 lbs per the bioimpedance scale (increase carbs/constipation).  Subjective:   Type 2 diabetes mellitus with other specified complication, without long-term current use of insulin ([name]). Diabetes is controlled.   Lab Results  Component Value Date   HGBA1C 6.2 (H) 04/05/2020   HGBA1C 6.0 (H) 11/13/2019   HGBA1C 6.4 (H) 07/21/2019   Lab Results  Component Value Date   LDLCALC 119 (H) 04/05/2020   CREATININE 0.81 04/05/2020   Lab Results  Component Value Date   INSULIN 19.2 04/05/2020   INSULIN 12.9 11/13/2019   INSULIN 35.9 (H) 07/21/2019   Mixed hyperlipidemia. [name] is on Lipitor.  Lab Results  Component Value Date   CHOL 184 04/05/2020   HDL 49 04/05/2020   LDLCALC 119 (H) 04/05/2020   LDLDIRECT 110.0 08/21/2016   TRIG 90 04/05/2020   CHOLHDL 4 10/12/2017   Lab Results  Component Value Date   ALT 13 04/05/2020   AST 14 04/05/2020   ALKPHOS 105 04/05/2020   BILITOT 0.4 04/05/2020   The 10-year ASCVD risk score Holly Bussing DC Jr., Holly al., [name]) is: 11.6%   Values used to calculate the score:     Age: 64 years     Sex: Female     Is Non-Hispanic African American: No     Diabetic: Yes     Tobacco smoker: No     Systolic Blood Pressure: 025 mmHg     Is BP treated: Yes     HDL Cholesterol: 49 mg/dL     Total Cholesterol: 184 mg/dL  Vitamin D deficiency. [name] is taking high dose Vitamin  D supplementation.    Ref. Range 04/05/2020 09:51  Vitamin D, 25-Hydroxy Latest Ref Range: 30.0 - 100.0 ng/mL 60.4   Assessment/Plan:   Type 2 diabetes mellitus with other specified complication, without long-term current use of insulin ([place]). Good blood sugar control is important to decrease the likelihood of diabetic complications such as nephropathy, neuropathy, limb loss, blindness, coronary artery disease, and death. Intensive lifestyle modification including diet, exercise and weight loss are the first line of treatment for diabetes. [name] will continue her medications as directed and continue metformin 1 in a.m. and 1 in p.m.  Mixed hyperlipidemia. Cardiovascular risk and specific lipid/LDL goals reviewed.  We discussed several lifestyle modifications today and Lometa will continue to work on diet, exercise and weight loss efforts. Orders and follow up as documented in patient record. She will continue Lipitor as directed.   Counseling Intensive lifestyle modifications are the first line treatment for this issue. . Dietary changes: Increase soluble fiber. Decrease simple carbohydrates. . Exercise changes: Moderate to vigorous-intensity aerobic activity 150 minutes per week if tolerated. . Lipid-lowering medications: see documented in medical record.  Vitamin D deficiency. Low Vitamin D level contributes to fatigue and are associated with obesity, breast, and colon cancer. She will begin OTC Vitamin D 2,000 IU daily and will  follow-up for routine testing of Vitamin D, at least 2-3 times per year to avoid over-replacement.  Class 3 severe obesity with serious comorbidity and body mass index (BMI) of 40.0 to 44.9 in adult, unspecified obesity type ([name]).  [name] is currently in the action stage of change. As such, her goal is to continue with weight loss efforts. She has agreed to the Category 4 Plan.   We independently reviewed with the patient labs from 04/05/2020 including CMP,  lipids, Vitamin D, A1c, and insulin.  She will work on meal planning.  Exercise goals: [name] will continue walking and will go back to the pool.  Behavioral modification strategies: increasing lean protein intake, decreasing simple carbohydrates, increasing vegetables, increasing water intake, decreasing eating out, no skipping meals, meal planning and cooking strategies, keeping healthy foods in the home and planning for success.  [name] has agreed to follow-up with our clinic in 2-3 weeks. She was informed of the importance of frequent follow-up visits to maximize her success with intensive lifestyle modifications for her multiple health conditions.   Objective:   Blood pressure 116/76, pulse 74, temperature 98.3 F (36.8 C), height 5\' 6"  (1.676 m), weight 255 lb (115.7 kg), SpO2 98 %. Body mass index is 41.16 kg/m.  General: Cooperative, alert, well developed, in no acute distress. HEENT: Conjunctivae and lids unremarkable. Cardiovascular: Regular rhythm.  Lungs: Normal work of breathing. Neurologic: No focal deficits.   Lab Results  Component Value Date   CREATININE 0.81 04/05/2020   BUN 15 04/05/2020   NA 140 04/05/2020   K 4.6 04/05/2020   CL 99 04/05/2020   CO2 30 (H) 04/05/2020   Lab Results  Component Value Date   ALT 13 04/05/2020   AST 14 04/05/2020   ALKPHOS 105 04/05/2020   BILITOT 0.4 04/05/2020   Lab Results  Component Value Date   HGBA1C 6.2 (H) 04/05/2020   HGBA1C 6.0 (H) 11/13/2019   HGBA1C 6.4 (H) 07/21/2019   HGBA1C 6.6 (H) 10/12/2017   HGBA1C 6.4 06/05/2017   Lab Results  Component Value Date   INSULIN 19.2 04/05/2020   INSULIN 12.9 11/13/2019   INSULIN 35.9 (H) 07/21/2019   Lab Results  Component Value Date   TSH 0.807 07/21/2019   Lab Results  Component Value Date   CHOL 184 04/05/2020   HDL 49 04/05/2020   LDLCALC 119 (H) 04/05/2020   LDLDIRECT 110.0 08/21/2016   TRIG 90 04/05/2020   CHOLHDL 4 10/12/2017   Lab Results    Component Value Date   WBC 9.3 09/19/2017   HGB 12.1 09/19/2017   HCT 36.6 09/19/2017   MCV 86.5 09/19/2017   PLT 260.0 09/19/2017   No results found for: IRON, TIBC, FERRITIN  Obesity Behavioral Intervention:   Approximately 15 minutes were spent on the discussion below.  ASK: We discussed the diagnosis of obesity with [name] today and [name] agreed to give [name] permission to discuss obesity behavioral modification therapy today.  ASSESS: [name] has the diagnosis of obesity and her BMI today is 41.3. [name] is in the action stage of change.   ADVISE: [name] was educated on the multiple health risks of obesity as well as the benefit of weight loss to improve her health. She was advised of the need for long term treatment and the importance of lifestyle modifications to improve her current health and to decrease her risk of future health problems.  AGREE: Multiple dietary modification options and treatment options were discussed and [name] agreed to follow  the recommendations documented in the above note.  ARRANGE: [name] was educated on the importance of frequent visits to treat obesity as outlined per CMS and USPSTF guidelines and agreed to schedule her next follow up appointment today.  Attestation Statements:   Reviewed by clinician on day of visit: allergies, medications, problem list, medical history, surgical history, family history, social history, and previous encounter notes.  [name], am acting as Location manager for [employer], DO   I have reviewed the above documentation for accuracy and completeness, and I agree with the above. [name], DO

## 2020-05-01 ENCOUNTER — Encounter: Payer: Self-pay | Admitting: Family Medicine

## 2020-05-01 ENCOUNTER — Telehealth (INDEPENDENT_AMBULATORY_CARE_PROVIDER_SITE_OTHER): Payer: Medicare Other | Admitting: Family Medicine

## 2020-05-01 VITALS — Ht 66.0 in

## 2020-05-01 DIAGNOSIS — J019 Acute sinusitis, unspecified: Secondary | ICD-10-CM

## 2020-05-01 DIAGNOSIS — B9789 Other viral agents as the cause of diseases classified elsewhere: Secondary | ICD-10-CM | POA: Diagnosis not present

## 2020-05-01 NOTE — Patient Instructions (Signed)
Nice to meet you (virtually) today, [name].  Continue flonase Start claritin, zyrtec, or allegra 1 tab daily Nasal saline spray 3-4x/day Start Mucinex 1 tab 2x/day x 1 week Ok to use Vics  Rub, cool mist humidifier at night Drink plenty of water - goal of 64oz per day

## 2020-05-01 NOTE — Progress Notes (Signed)
Virtual Visit via Video Note  I connected with [name] on 05/01/20 at 10:00 AM EST by a video enabled telemedicine application and verified that I am speaking with the correct person using two identifiers. Location patient: home Location provider: work or home office Persons participating in the virtual visit: patient, provider  I discussed the limitations of evaluation and management by telemedicine and the availability of in person appointments. The patient expressed understanding and agreed to proceed.  Chief Complaint  Patient presents with  . Acute Visit    Pt c/o having sinus pressure, post nasal drip and a stratchy throat.  Pt said that is started yesterday afternoon.  Pt has been taking Flonase.    HPI: [name] is a 64 y.o. female patient of Dr. Charlett Blake who complains of 1 day h/o scratchy throat, PND, sinus pressure. She also has nasal congestion. No runny nose, cough. No fever, chills, myalgias, ear pain, loss of taste or smell. No SOB. She started using flonase yesterday. No other OTC meds/treatments.    Past Medical History:  Diagnosis Date  . Allergic rhinitis   . Allergy   . Anemia 07/17/2016  . Anxiety   . Asthma    seasonal  . Blood transfusion without reported diagnosis   . Breast cancer ([place])    right  . Bronchitis   . Colon polyps   . CPAP (continuous positive airway pressure) dependence   . Depression   . Diabetes mellitus    borderline but takes metformin  . Edema   . Family history of breast cancer in first degree relative   . Fibromyalgia   . GERD (gastroesophageal reflux disease)   . Hyperglycemia 03/05/2017  . Hyperlipidemia 08/21/2016   no meds  . Hyperparathyroidism   . Hypertension    controlled by medications  . Joint pain   . Neuromuscular disorder (H[facility])    Fibromyalgia  . Obesity   . Osteoarthritis    RA  . PONV (postoperative nausea and vomiting)    occasional  . Pre-diabetes   . Sleep apnea    wears cpap  .  Viral meningitis   . Vitamin D deficiency     Past Surgical History:  Procedure Laterality Date  . ABDOMINAL HYSTERECTOMY  1998  . ADENOIDECTOMY    . BREAST EXCISIONAL BIOPSY Left 1993   benign  . BREAST SURGERY     Tran flap due to breast cancer  . CESAREAN SECTION  1988  . COLONOSCOPY  08/14/2011  . HAND SURGERY     dog bite, right hand  . HAND SURGERY     trauma, left hand  . KNEE ARTHROSCOPY     bilateral  . left knee replacement  2010  . MASTECTOMY  01/13/94   Right breast  . parathyroid resection    . PARATHYROIDECTOMY    . POLYPECTOMY    . rectal abscess    . SEPTOPLASTY  1980  . SHOULDER ARTHROSCOPY Right 11/09/2015   Procedure: ARTHROSCOPY SHOULDER-acromioplasty, distal clavicle resection and debridement;  Surgeon: Melrose Nakayama, MD;  Location: [facility];  Service: Orthopedics;  Laterality: Right;  . shoulder arthroscopy     rotator cuff repair  . TOE SURGERY Right    paronychia and adenoma removed  . TONSILLECTOMY    . TOTAL KNEE ARTHROPLASTY  06/18/08   [facility]  . TUMOR REMOVAL  1982   , scalp    Family History  Problem Relation Age of Onset  . Emphysema Father   .  Lung cancer Father        lung ca dx 19  . Other Father        prostate issues  . Diabetes Father   . Breast cancer Maternal Aunt        dx late 19s  . Colon cancer Maternal Aunt        dx 69s  . Colon polyps Maternal Aunt   . Prostate cancer Maternal Grandfather 85       d. 85y  . Heart disease Paternal Grandfather   . Heart attack Paternal Grandfather        d. 50y  . Diabetes Paternal Grandfather   . Asthma Daughter        "seasonal"  . Arthritis Maternal Grandmother   . Aneurysm Maternal Grandmother        d. brain aneurysm at 68  . Arthritis Mother   . Colon polyps Mother   . Hypertension Mother   . Sleep apnea Mother   . Multiple sclerosis Sister   . Breast cancer Sister 39       L IDC and DCIS; ER/PR+, Her2-  . Fibrocystic breast disease Sister   . Arthritis/Rheumatoid  Sister   . Breast cancer Sister   . Cancer Maternal Uncle        d. mouth cancer at younger age; smoker  . Other Paternal Uncle        muscle issues - couldn't walk or talk; d. 20y  . Cancer Maternal Aunt        lymphoma, dx 26s  . Ovarian cancer Maternal Aunt        dx 90s; d. late 29s  . Lung cancer Maternal Uncle        d. 58y; former smoker  . Throat cancer Cousin        maternal 1st cousin; smoker  . Breast cancer Cousin 9       maternal 1st cousin  . Other Paternal Uncle        prostate issues  . Breast cancer Cousin        paternal 1st cousin dx late 49s  . Throat cancer Cousin        maternal 1st cousin; used SL tobacco  . Prostate cancer Cousin        maternal 1st cousin dx 70s  . Esophageal cancer Neg Hx   . Rectal cancer Neg Hx   . Stomach cancer Neg Hx     Social History   Tobacco Use  . Smoking status: Never Smoker  . Smokeless tobacco: Never Used  Vaping Use  . Vaping Use: Never used  Substance Use Topics  . Alcohol use: Not Currently  . Drug use: No     Current Outpatient Medications:  .  atorvastatin (LIPITOR) 10 MG tablet, Take 1 tablet (10 mg total) by mouth 2 (two) times a week., Disp: 10 tablet, Rfl: 3 .  Calcium Carbonate (CALTRATE 600) 1500 MG TABS, Take 1,500 mg by mouth daily. , Disp: , Rfl:  .  diclofenac Sodium (VOLTAREN) 1 % GEL, diclofenac 1 % topical gel  APPLY A SMALL AMOUNT TO AFFECTED AREA FOUR TIMES A DAY AS NEEDED FOR PAIN, Disp: , Rfl:  .  famotidine (PEPCID) 40 MG tablet, Take 1 tablet (40 mg total) by mouth daily., Disp: 30 tablet, Rfl: 5 .  fluticasone (FLONASE) 50 MCG/ACT nasal spray, Place 2 sprays into both nostrils daily., Disp: , Rfl:  .  furosemide (LASIX) 40 MG tablet,  TAKE 1 TABLET BY MOUTH  TWICE DAILY, Disp: 180 tablet, Rfl: 3 .  glucosamine-chondroitin 500-400 MG tablet, Take 2 tablets by mouth 2 (two) times daily., Disp: , Rfl:  .  hydrochlorothiazide (MICROZIDE) 12.5 MG capsule, Take 1 capsule (12.5 mg total) by  mouth daily., Disp: 90 capsule, Rfl: 1 .  metFORMIN (GLUCOPHAGE) 500 MG tablet, TAKE 1 TABLET BY MOUTH  TWICE DAILY WITH A MEAL, Disp: 180 tablet, Rfl: 3 .  Multiple Vitamins-Minerals (MULTIVITAMIN & MINERAL PO), Take 1 tablet by mouth daily., Disp: , Rfl:  .  sertraline (ZOLOFT) 50 MG tablet, Take 0.5 tablets (25 mg total) by mouth daily. 1/2 tab po daily x 7 days and then increase to 1 tab po daily, Disp: 45 tablet, Rfl: 1 .  telmisartan (MICARDIS) 20 MG tablet, TAKE 1 TABLET BY MOUTH  DAILY, Disp: 90 tablet, Rfl: 3 .  Vitamin D, Ergocalciferol, (DRISDOL) 1.25 MG (50000 UNIT) CAPS capsule, TAKE ONE CAPSULE BY MOUTH EVERY 7 DAYS, Disp: 4 capsule, Rfl: 0 .  Zinc 30 MG CAPS, Take by mouth., Disp: , Rfl:   Allergies  Allergen Reactions  . Allopurinol Hives  . Mucinex [Guaifenesin Er] Shortness Of Breath  . Penicillins Hives and Other (See Comments)    Has patient had a PCN reaction causing immediate rash, facial/tongue/throat swelling, SOB or lightheadedness with hypotension: no Has patient had a PCN reaction causing severe rash involving mucus membranes or skin necrosis: no Has patient had a PCN reaction that required hospitalization no Has patient had a PCN reaction occurring within the last 10 years: no If all of the above answers are "NO", then may proceed with Cephalosporin use.   . Prozac [Fluoxetine Hcl] Hives  . Amitriptyline Other (See Comments)    "felt weird, fatigue, dizziness"  . Lexapro [Escitalopram Oxalate]     Nausea and hypersalivation.       ROS: See pertinent positives and negatives per HPI.   EXAM:  VITALS per patient if applicable: Ht '5\' 6"'  (1.676 m)   BMI 41.16 kg/m    GENERAL: alert, oriented, in no acute distress  HEENT: atraumatic, conjunctiva clear, no obvious abnormalities on inspection of external nose and ears  NECK: normal movements of the head and neck  LUNGS: on inspection no signs of respiratory distress, breathing rate appears normal, no  obvious gross SOB, gasping or wheezing, no conversational dyspnea  CV: no obvious cyanosis  PSYCH/NEURO: pleasant and cooperative, speech and thought processing grossly intact   ASSESSMENT AND PLAN: 1. Acute viral sinusitis - symptoms x 1 day - recommended increased water intake, nasal saline spray, cont flonse, add claritin/zyrtec/allegra 1 tab daily and mucinex 1 tab po BID, tylenol or ibuprofen PRN - ok to use Vics rubs, cool mist humidifier - f/u with PCP if symptoms worsen or do not start to improve in the next week Discussed plan and reviewed medications with patient, including risks, benefits, and potential side effects. Pt expressed understand. All questions answered.     I discussed the assessment and treatment plan with the patient. The patient was provided an opportunity to ask questions and all were answered. The patient agreed with the plan and demonstrated an understanding of the instructions.   The patient was advised to call back or seek an in-person evaluation if the symptoms worsen or if the condition fails to improve as anticipated.   Letta Median, DO

## 2020-05-03 ENCOUNTER — Telehealth: Payer: Self-pay

## 2020-05-03 NOTE — Telephone Encounter (Signed)
Nurse Assessment Nurse: Alveta Heimlich, RN, Rise Paganini Date/Time (Eastern Time): 05/01/2020 9:26:08 AM Confirm and document reason for call. If symptomatic, describe symptoms. ---Caller states she has sinus pressure , face pain , sore throat and nose dripping. Also ear pain. Started yesterday. Also a slight cough. No fever Does the patient have any new or worsening symptoms? ---Yes Will a triage be completed? ---Yes Related visit to physician within the last 2 weeks? ---No Does the PT have any chronic conditions? (i.e. diabetes, asthma, this includes High risk factors for pregnancy, etc.) ---Yes List chronic conditions. ---borderline diabetic, seasonal asthma, HTN Is this a behavioral health or substance abuse call? ---No Guidelines Guideline Title Affirmed Question Affirmed Notes Nurse Date/Time (Eastern Time) COVID-19 - Diagnosed or Suspected HIGH RISK for severe COVID complications (e.g., age > 97 years, obesity with BMI > 25, pregnant, chronic lung disease or other chronic medical condition) (Exception: Already seen by PCP and no new or worsening symptoms.) Alveta Heimlich, RN, Rise Paganini 05/01/2020 9:28:00 AM Disp. Time Eilene Ghazi Time) Disposition Final User PLEASE NOTE: All timestamps contained within this report are represented as [place] Standard Time. CONFIDENTIALTY NOTICE: This fax transmission is intended only for the addressee. It contains information that is legally privileged, confidential or otherwise protected from use or disclosure. If you are not the intended recipient, you are strictly prohibited from reviewing, disclosing, copying using or disseminating any of this information or taking any action in reliance on or regarding this information. If you have received this fax in error, please notify us immediately by telephone so that we can arrange for its return to [name]. Phone: [phone], Toll-Free: [phone], Fax: [phone] Page: 2 of 2 Call Id: [id number] 05/01/2020 9:29:31 AM Call  PCP Now Yes Alveta Heimlich, RN, Ali Lowe Disagree/Comply Comply Caller Understands Yes PreDisposition Call Doctor Care Advice Given Per Guideline CALL PCP NOW: CALL BACK IF: * You become worse CARE ADVICE given per COVID-19 - DIAGNOSED OR SUSPECTED (Adult) guideline. Referrals [facility]  Pt seen by Dr. Bryan Lemma 05/01/2020

## 2020-05-18 ENCOUNTER — Other Ambulatory Visit: Payer: Self-pay

## 2020-05-18 ENCOUNTER — Encounter (INDEPENDENT_AMBULATORY_CARE_PROVIDER_SITE_OTHER): Payer: Self-pay | Admitting: Bariatrics

## 2020-05-18 ENCOUNTER — Ambulatory Visit (INDEPENDENT_AMBULATORY_CARE_PROVIDER_SITE_OTHER): Payer: Medicare Other | Admitting: Bariatrics

## 2020-05-18 VITALS — BP 112/67 | HR 71 | Temp 98.3°F | Ht 66.0 in | Wt 253.0 lb

## 2020-05-18 DIAGNOSIS — I152 Hypertension secondary to endocrine disorders: Secondary | ICD-10-CM | POA: Diagnosis not present

## 2020-05-18 DIAGNOSIS — E1159 Type 2 diabetes mellitus with other circulatory complications: Secondary | ICD-10-CM | POA: Diagnosis not present

## 2020-05-18 DIAGNOSIS — E782 Mixed hyperlipidemia: Secondary | ICD-10-CM

## 2020-05-18 DIAGNOSIS — Z9189 Other specified personal risk factors, not elsewhere classified: Secondary | ICD-10-CM

## 2020-05-18 DIAGNOSIS — E1169 Type 2 diabetes mellitus with other specified complication: Secondary | ICD-10-CM

## 2020-05-18 DIAGNOSIS — E669 Obesity, unspecified: Secondary | ICD-10-CM

## 2020-05-18 MED ORDER — SAXENDA 18 MG/3ML ~~LOC~~ SOPN: PEN_INJECTOR | SUBCUTANEOUS | 0 refills | Status: DC

## 2020-05-18 MED ORDER — INSULIN PEN NEEDLE 32G X 6 MM MISC: 0 refills | Status: DC

## 2020-05-18 NOTE — Progress Notes (Signed)
Chief Complaint:   OBESITY [name] is here to discuss her progress with her obesity treatment plan along with follow-up of her obesity related diagnoses. [name] is on the Category 4 Plan and states she is following her eating plan approximately 75% of the time. [name] states she is walking 30 minutes 2 times per week.  Today's visit was #: 41 Starting weight: 301 lbs Starting date: 07/21/2019 Today's weight: 253 lbs Today's date: 05/18/2020 Total lbs lost to date: 48 Total lbs lost since last in-office visit: 2  Interim History: [name] is down 2 lbs and doing very well overall. She states that it is harder to lose weight now.  Subjective:   Hypertension associated with type 2 diabetes mellitus ([place]). [name] is taking Microzide and Micardis. Blood pressure is well controlled.  BP Readings from Last 3 Encounters:  05/18/20 112/67  04/26/20 116/76  04/19/20 130/72   Lab Results  Component Value Date   CREATININE 0.81 04/05/2020   CREATININE 0.94 10/21/2019   CREATININE 0.88 07/21/2019   Mixed hyperlipidemia. [name] is taking Lipitor.   Lab Results  Component Value Date   CHOL 184 04/05/2020   HDL 49 04/05/2020   LDLCALC 119 (H) 04/05/2020   LDLDIRECT 110.0 08/21/2016   TRIG 90 04/05/2020   CHOLHDL 4 10/12/2017   Lab Results  Component Value Date   ALT 13 04/05/2020   AST 14 04/05/2020   ALKPHOS 105 04/05/2020   BILITOT 0.4 04/05/2020   The 10-year ASCVD risk score [name] DC Jr., et al., 2013) is: 10.9%   Values used to calculate the score:     Age: 64 years     Sex: Female     Is Non-Hispanic African American: No     Diabetic: Yes     Tobacco smoker: No     Systolic Blood Pressure: 578 mmHg     Is BP treated: Yes     HDL Cholesterol: 49 mg/dL     Total Cholesterol: 184 mg/dL  Type 2 diabetes mellitus with obesity ([place]). [name] is taking metformin and denies contraindications.   Lab Results  Component Value Date   HGBA1C 6.2 (H)  04/05/2020   HGBA1C 6.0 (H) 11/13/2019   HGBA1C 6.4 (H) 07/21/2019   Lab Results  Component Value Date   LDLCALC 119 (H) 04/05/2020   CREATININE 0.81 04/05/2020   Lab Results  Component Value Date   INSULIN 19.2 04/05/2020   INSULIN 12.9 11/13/2019   INSULIN 35.9 (H) 07/21/2019   At risk for constipation. [name] is at increased risk for constipation due to Saxenda. [name] denies hard, infrequent stools currently.   Assessment/Plan:   Hypertension associated with type 2 diabetes mellitus ([place]). [name] is working on healthy weight loss and exercise to improve blood pressure control. We will watch for signs of hypotension as she continues her lifestyle modifications. She will continue her medications as directed.   Mixed hyperlipidemia. Cardiovascular risk and specific lipid/LDL goals reviewed.  We discussed several lifestyle modifications today and [name] will continue to work on diet, exercise and weight loss efforts. Orders and follow up as documented in patient record. She will continue her medication as directed, avoid trans fats, and decrease saturated fats.  Counseling Intensive lifestyle modifications are the first line treatment for this issue.  Dietary changes: Increase soluble fiber. Decrease simple carbohydrates.  Exercise changes: Moderate to vigorous-intensity aerobic activity 150 minutes per week if tolerated.  Lipid-lowering medications: see documented in medical record.  Type  2 diabetes mellitus with obesity ([place]). Good blood sugar control is important to decrease the likelihood of diabetic complications such as nephropathy, neuropathy, limb loss, blindness, coronary artery disease, and death. Intensive lifestyle modification including diet, exercise and weight loss are the first line of treatment for diabetes. [name] will continue her medication as directed.   At risk for constipation. [name] was given approximately 15 minutes of counseling today regarding  prevention of constipation. She was encouraged to increase water and fiber intake.   Class 3 severe obesity with serious comorbidity and body mass index (BMI) of 40.0 to 44.9 in adult, unspecified obesity type ([place]). Prescriptions were given for Saxenda 0.6 mg daily into skin, 1 month supply with 0 refills and needles 32 G 6 MM #50 with 0 refills.  [name] is currently in the action stage of change. As such, her goal is to continue with weight loss efforts. She has agreed to the Category 4 Plan.   She will work on meal planning and intentional eating.   Exercise goals: [name] will walk more for exercise.  Behavioral modification strategies: increasing lean protein intake, decreasing simple carbohydrates, increasing vegetables, increasing water intake, decreasing eating out, no skipping meals, meal planning and cooking strategies, keeping healthy foods in the home and planning for success.  [name] has agreed to follow-up with our clinic fasting in 3 weeks. She was informed of the importance of frequent follow-up visits to maximize her success with intensive lifestyle modifications for her multiple health conditions.   Objective:   Blood pressure 112/67, pulse 71, temperature 98.3 F (36.8 C), temperature source Oral, height 5\' 6"  (1.676 m), weight 253 lb (114.8 kg), SpO2 95 %. Body mass index is 40.84 kg/m.  General: Cooperative, alert, well developed, in no acute distress. HEENT: Conjunctivae and lids unremarkable. Cardiovascular: Regular rhythm.  Lungs: Normal work of breathing. Neurologic: No focal deficits.   Lab Results  Component Value Date   CREATININE 0.81 04/05/2020   BUN 15 04/05/2020   NA 140 04/05/2020   K 4.6 04/05/2020   CL 99 04/05/2020   CO2 30 (H) 04/05/2020   Lab Results  Component Value Date   ALT 13 04/05/2020   AST 14 04/05/2020   ALKPHOS 105 04/05/2020   BILITOT 0.4 04/05/2020   Lab Results  Component Value Date   HGBA1C 6.2 (H) 04/05/2020   HGBA1C  6.0 (H) 11/13/2019   HGBA1C 6.4 (H) 07/21/2019   HGBA1C 6.6 (H) 10/12/2017   HGBA1C 6.4 06/05/2017   Lab Results  Component Value Date   INSULIN 19.2 04/05/2020   INSULIN 12.9 11/13/2019   INSULIN 35.9 (H) 07/21/2019   Lab Results  Component Value Date   TSH 0.807 07/21/2019   Lab Results  Component Value Date   CHOL 184 04/05/2020   HDL 49 04/05/2020   LDLCALC 119 (H) 04/05/2020   LDLDIRECT 110.0 08/21/2016   TRIG 90 04/05/2020   CHOLHDL 4 10/12/2017   Lab Results  Component Value Date   WBC 9.3 09/19/2017   HGB 12.1 09/19/2017   HCT 36.6 09/19/2017   MCV 86.5 09/19/2017   PLT 260.0 09/19/2017   No results found for: IRON, TIBC, FERRITIN  Attestation Statements:   Reviewed by clinician on day of visit: allergies, medications, problem list, medical history, surgical history, family history, social history, and previous encounter notes.  [name], am acting as Location manager for [employer], DO   I have reviewed the above documentation for accuracy and completeness, and I agree  with the above. [name], DO

## 2020-05-19 ENCOUNTER — Telehealth: Payer: Self-pay | Admitting: Pulmonary Disease

## 2020-05-19 NOTE — Telephone Encounter (Signed)
Message setn to [facility] looks like Judeen Hammans spoke to the patient back in Sept and told her it was sent to [facility] waiting on a response

## 2020-05-19 NOTE — Telephone Encounter (Signed)
I received a message back from [place]  The repap department called the patient and lvm for her to return their phone call. A new order is now needed for the patient and I am going to fax that over for the doctor to sign and reach out to the repap dept to please give the patient a call again.

## 2020-05-26 NOTE — Telephone Encounter (Signed)
I have this form but Vassie Loll is not in the office to sign he is here 1/4 I will give it to him then

## 2020-05-26 NOTE — Telephone Encounter (Signed)
Called and left detailed message that the form will be signed on 1.4.22 when RA is back in the office and then faxed back to [facility].

## 2020-05-26 NOTE — Telephone Encounter (Signed)
Is this a form coming to the [facility] first or coming to the main fax?  Victorino Dike, have you seen a form for this pt for her CPAP? Thanks.

## 2020-06-08 ENCOUNTER — Telehealth: Payer: Self-pay | Admitting: Pulmonary Disease

## 2020-06-08 NOTE — Telephone Encounter (Signed)
Message sent to [facility].  

## 2020-06-09 NOTE — Telephone Encounter (Signed)
[facility] responded they are calling pt with an up [name]

## 2020-06-10 ENCOUNTER — Telehealth (INDEPENDENT_AMBULATORY_CARE_PROVIDER_SITE_OTHER): Payer: Medicare Other | Admitting: Bariatrics

## 2020-06-10 ENCOUNTER — Encounter (INDEPENDENT_AMBULATORY_CARE_PROVIDER_SITE_OTHER): Payer: Self-pay | Admitting: Bariatrics

## 2020-06-10 DIAGNOSIS — Z6841 Body Mass Index (BMI) 40.0 and over, adult: Secondary | ICD-10-CM

## 2020-06-10 DIAGNOSIS — E7849 Other hyperlipidemia: Secondary | ICD-10-CM

## 2020-06-10 DIAGNOSIS — I10 Essential (primary) hypertension: Secondary | ICD-10-CM

## 2020-06-13 ENCOUNTER — Other Ambulatory Visit: Payer: Self-pay | Admitting: Family Medicine

## 2020-06-15 NOTE — Progress Notes (Unsigned)
TeleHealth Visit:  Due to the COVID-19 pandemic, this visit was completed with telemedicine (audio/video) technology to reduce patient and provider exposure as well as to preserve personal protective equipment.   [name] has verbally consented to this TeleHealth visit. The patient is located at home, the provider is located at the [facility] office. The participants in this visit include the listed provider and patient. The visit was conducted today via MyChart video.  Chief Complaint: OBESITY [name] is here to discuss her progress with her obesity treatment plan along with follow-up of her obesity related diagnoses. [name] is on the Category 4 Plan and states she is following her eating plan approximately 75% of the time. [name] states she is not exercising at this time.  Today's visit was #: 17 Starting weight: 301 lbs Starting date: 07/21/2019  Interim History: She states that her weight is the same.  She is having sinus pressure and pain.  She gets sinus infections. She states that this affects her taste.   Subjective:   1. Essential hypertension Review: taking medications as instructed, no medication side effects noted, no chest pain on exertion, no dyspnea on exertion, no swelling of ankles.  She is taking HCTZ and Micardis as directed.  BP Readings from Last 3 Encounters:  05/18/20 112/67  04/26/20 116/76  04/19/20 130/72   2. Other hyperlipidemia [name] has hyperlipidemia and has been trying to improve her cholesterol levels with intensive lifestyle modification including a low saturated fat diet, exercise and weight loss. She denies any chest pain, claudication or myalgias.  She is taking Lipitor.  Lab Results  Component Value Date   ALT 13 04/05/2020   AST 14 04/05/2020   ALKPHOS 105 04/05/2020   BILITOT 0.4 04/05/2020   Lab Results  Component Value Date   CHOL 184 04/05/2020   HDL 49 04/05/2020   LDLCALC 119 (H) 04/05/2020   LDLDIRECT 110.0  08/21/2016   TRIG 90 04/05/2020   CHOLHDL 4 10/12/2017   Assessment/Plan:   1. Essential hypertension [name] is working on healthy weight loss and exercise to improve blood pressure control. We will watch for signs of hypotension as she continues her lifestyle modifications.  Continue medications.  2. Other hyperlipidemia Cardiovascular risk and specific lipid/LDL goals reviewed.  We discussed several lifestyle modifications today and [name] will continue to work on diet, exercise and weight loss efforts. Orders and follow up as documented in patient record.  Continue Lipitor.  Eliminate trans fats and increase PUFAs and MUFAs.  Counseling Intensive lifestyle modifications are the first line treatment for this issue. . Dietary changes: Increase soluble fiber. Decrease simple carbohydrates. . Exercise changes: Moderate to vigorous-intensity aerobic activity 150 minutes per week if tolerated. . Lipid-lowering medications: see documented in medical record.  3. Class 3 severe obesity with serious comorbidity and body mass index (BMI) of 40.0 to 44.9 in adult, unspecified obesity type (HCC)  [name] is currently in the action stage of change. As such, her goal is to continue with weight loss efforts. She has agreed to the Category 4 Plan.   She will remain adherent to the plan, will work on meal planning, will increase her protein intake, and will increase her water intake.  Exercise goals: All adults should avoid inactivity. Some physical activity is better than none, and adults who participate in any amount of physical activity gain some health benefits.  Behavioral modification strategies: increasing lean protein intake, decreasing simple carbohydrates, increasing vegetables, increasing water intake, decreasing  eating out, no skipping meals, meal planning and cooking strategies, keeping healthy foods in the home and planning for success.  [name] has agreed to follow-up with our clinic in  2 weeks. She was informed of the importance of frequent follow-up visits to maximize her success with intensive lifestyle modifications for her multiple health conditions.  Objective:   VITALS: Per patient if applicable, see vitals. GENERAL: Alert and in no acute distress. CARDIOPULMONARY: No increased WOB. Speaking in clear sentences.  PSYCH: Pleasant and cooperative. Speech normal rate and rhythm. Affect is appropriate. Insight and judgement are appropriate. Attention is focused, linear, and appropriate.  NEURO: Oriented as arrived to appointment on time with no prompting.   Lab Results  Component Value Date   CREATININE 0.81 04/05/2020   BUN 15 04/05/2020   NA 140 04/05/2020   K 4.6 04/05/2020   CL 99 04/05/2020   CO2 30 (H) 04/05/2020   Lab Results  Component Value Date   ALT 13 04/05/2020   AST 14 04/05/2020   ALKPHOS 105 04/05/2020   BILITOT 0.4 04/05/2020   Lab Results  Component Value Date   HGBA1C 6.2 (H) 04/05/2020   HGBA1C 6.0 (H) 11/13/2019   HGBA1C 6.4 (H) 07/21/2019   HGBA1C 6.6 (H) 10/12/2017   HGBA1C 6.4 06/05/2017   Lab Results  Component Value Date   INSULIN 19.2 04/05/2020   INSULIN 12.9 11/13/2019   INSULIN 35.9 (H) 07/21/2019   Lab Results  Component Value Date   TSH 0.807 07/21/2019   Lab Results  Component Value Date   CHOL 184 04/05/2020   HDL 49 04/05/2020   LDLCALC 119 (H) 04/05/2020   LDLDIRECT 110.0 08/21/2016   TRIG 90 04/05/2020   CHOLHDL 4 10/12/2017   Lab Results  Component Value Date   WBC 9.3 09/19/2017   HGB 12.1 09/19/2017   HCT 36.6 09/19/2017   MCV 86.5 09/19/2017   PLT 260.0 09/19/2017   Attestation Statements:   Reviewed by clinician on day of visit: allergies, medications, problem list, medical history, surgical history, family history, social history, and previous encounter notes.  Time spent on visit including pre-visit chart review and post-visit charting and care was 20 minutes.   I, Water quality scientist, CMA, am  acting as Location manager for [employer], DO  I have reviewed the above documentation for accuracy and completeness, and I agree with the above. [name], DO

## 2020-06-17 ENCOUNTER — Other Ambulatory Visit: Payer: Self-pay

## 2020-06-17 ENCOUNTER — Ambulatory Visit (INDEPENDENT_AMBULATORY_CARE_PROVIDER_SITE_OTHER): Payer: Medicare Other | Admitting: Family

## 2020-06-17 VITALS — BP 122/70 | HR 76 | Temp 99.0°F | Resp 16 | Ht 66.0 in | Wt 266.0 lb

## 2020-06-17 DIAGNOSIS — M7918 Myalgia, other site: Secondary | ICD-10-CM

## 2020-06-17 MED ORDER — CYCLOBENZAPRINE HCL 5 MG PO TABS: 5.0000 mg | ORAL_TABLET | Freq: Every evening | ORAL | 0 refills | Status: DC | PRN

## 2020-06-17 MED ORDER — IBUPROFEN 600 MG PO TABS: 600.0000 mg | ORAL_TABLET | Freq: Three times a day (TID) | ORAL | 0 refills | Status: DC | PRN

## 2020-06-17 NOTE — Patient Instructions (Signed)
Begin ibuprofen as needed for moderate/severe pain. For less severe pain you may use tylenol. You may use flexeril at bedtime as needed (muscle relaxer). Please call if new/worsening symptoms or if symptom are not improved in 1 week.

## 2020-06-17 NOTE — Progress Notes (Signed)
Subjective:    Patient ID: [name], female    DOB: [birth year], 65 y.o.   MRN: [id number]  HPI  Patient is a 65 yr old female who presents today with chief complaint of left sided ear pain.  Symptoms started 1 year ago.  Started with itching in the ear canal.  She has soreness surrounding the ear.    Review of Systems    see HPI  Past Medical History:  Diagnosis Date  . Allergic rhinitis   . Allergy   . Anemia 07/17/2016  . Anxiety   . Asthma    seasonal  . Blood transfusion without reported diagnosis   . Breast cancer ([place])    right  . Bronchitis   . Colon polyps   . CPAP (continuous positive airway pressure) dependence   . Depression   . Diabetes mellitus    borderline but takes metformin  . Edema   . Family history of breast cancer in first degree relative   . Fibromyalgia   . GERD (gastroesophageal reflux disease)   . Hyperglycemia 03/05/2017  . Hyperlipidemia 08/21/2016   no meds  . Hyperparathyroidism   . Hypertension    controlled by medications  . Joint pain   . Neuromuscular disorder (HCC)    Fibromyalgia  . Obesity   . Osteoarthritis    RA  . PONV (postoperative nausea and vomiting)    occasional  . Pre-diabetes   . Sleep apnea    wears cpap  . Viral meningitis   . Vitamin D deficiency      Social History   Socioeconomic History  . Marital status: Widowed    Spouse name: Not on file  . Number of children: 1  . Years of education: Not on file  . Highest education level: 12th grade  Occupational History  . Occupation: ACCOUNTING    Employer: [employer]  Tobacco Use  . Smoking status: Never Smoker  . Smokeless tobacco: Never Used  Vaping Use  . Vaping Use: Never used  Substance and Sexual Activity  . Alcohol use: Not Currently  . Drug use: No  . Sexual activity: Not on file  Other Topics Concern  . Not on file  Social History Narrative  . Not on file   Social Determinants of Health   Financial Resource Strain: Not  on file  Food Insecurity: Not on file  Transportation Needs: Not on file  Physical Activity: Not on file  Stress: Not on file  Social Connections: Not on file  Intimate Partner Violence: Not on file    Past Surgical History:  Procedure Laterality Date  . ABDOMINAL HYSTERECTOMY  1998  . ADENOIDECTOMY    . BREAST EXCISIONAL BIOPSY Left 1993   benign  . BREAST SURGERY     Tran flap due to breast cancer  . CESAREAN SECTION  1988  . COLONOSCOPY  08/14/2011  . HAND SURGERY     dog bite, right hand  . HAND SURGERY     trauma, left hand  . KNEE ARTHROSCOPY     bilateral  . left knee replacement  2010  . MASTECTOMY  01/13/94   Right breast  . parathyroid resection    . PARATHYROIDECTOMY    . POLYPECTOMY    . rectal abscess    . SEPTOPLASTY  1980  . SHOULDER ARTHROSCOPY Right 11/09/2015   Procedure: ARTHROSCOPY SHOULDER-acromioplasty, distal clavicle resection and debridement;  Surgeon: Melrose Nakayama, MD;  Location: [facility];  Service:  Orthopedics;  Laterality: Right;  . shoulder arthroscopy     rotator cuff repair  . TOE SURGERY Right    paronychia and adenoma removed  . TONSILLECTOMY    . TOTAL KNEE ARTHROPLASTY  06/18/08   [facility]  . TUMOR REMOVAL  1982   , scalp    Family History  Problem Relation Age of Onset  . Emphysema Father   . Lung cancer Father        lung ca dx 64  . Other Father        prostate issues  . Diabetes Father   . Breast cancer Maternal Aunt        dx late 52s  . Colon cancer Maternal Aunt        dx 81s  . Colon polyps Maternal Aunt   . Prostate cancer Maternal Grandfather 85       d. 85y  . Heart disease Paternal Grandfather   . Heart attack Paternal Grandfather        d. 50y  . Diabetes Paternal Grandfather   . Asthma Daughter        "seasonal"  . Arthritis Maternal Grandmother   . Aneurysm Maternal Grandmother        d. brain aneurysm at 28  . Arthritis Mother   . Colon polyps Mother   . Hypertension Mother   . Sleep apnea Mother    . Multiple sclerosis Sister   . Breast cancer Sister 47       L IDC and DCIS; ER/PR+, Her2-  . Fibrocystic breast disease Sister   . Arthritis/Rheumatoid Sister   . Breast cancer Sister   . Cancer Maternal Uncle        d. mouth cancer at younger age; smoker  . Other Paternal Uncle        muscle issues - couldn't walk or talk; d. 20y  . Cancer Maternal Aunt        lymphoma, dx 108s  . Ovarian cancer Maternal Aunt        dx 44s; d. late 69s  . Lung cancer Maternal Uncle        d. 69y; former smoker  . Throat cancer Cousin        maternal 1st cousin; smoker  . Breast cancer Cousin 28       maternal 1st cousin  . Other Paternal Uncle        prostate issues  . Breast cancer Cousin        paternal 1st cousin dx late 78s  . Throat cancer Cousin        maternal 1st cousin; used SL tobacco  . Prostate cancer Cousin        maternal 1st cousin dx 54s  . Esophageal cancer Neg Hx   . Rectal cancer Neg Hx   . Stomach cancer Neg Hx     Allergies  Allergen Reactions  . Allopurinol Hives  . Mucinex [Guaifenesin Er] Shortness Of Breath  . Penicillins Hives and Other (See Comments)    Has patient had a PCN reaction causing immediate rash, facial/tongue/throat swelling, SOB or lightheadedness with hypotension: no Has patient had a PCN reaction causing severe rash involving mucus membranes or skin necrosis: no Has patient had a PCN reaction that required hospitalization no Has patient had a PCN reaction occurring within the last 10 years: no If all of the above answers are "NO", then may proceed with Cephalosporin use.   . Prozac [Fluoxetine Hcl] Hives  .  Amitriptyline Other (See Comments)    "felt weird, fatigue, dizziness"  . Lexapro [Escitalopram Oxalate]     Nausea and hypersalivation.     Current Outpatient Medications on File Prior to Visit  Medication Sig Dispense Refill  . atorvastatin (LIPITOR) 10 MG tablet Take 1 tablet (10 mg total) by mouth 2 (two) times a week. 10 tablet  3  . calcium carbonate (OSCAL) 1500 (600 Ca) MG TABS tablet Take 1,500 mg by mouth daily.    . diclofenac Sodium (VOLTAREN) 1 % GEL diclofenac 1 % topical gel  APPLY A SMALL AMOUNT TO AFFECTED AREA FOUR TIMES A DAY AS NEEDED FOR PAIN    . famotidine (PEPCID) 40 MG tablet Take 1 tablet (40 mg total) by mouth daily. 30 tablet 5  . fluticasone (FLONASE) 50 MCG/ACT nasal spray Place 2 sprays into both nostrils daily.    . furosemide (LASIX) 40 MG tablet TAKE 1 TABLET BY MOUTH  TWICE DAILY 180 tablet 3  . glucosamine-chondroitin 500-400 MG tablet Take 2 tablets by mouth 2 (two) times daily.    . hydrochlorothiazide (MICROZIDE) 12.5 MG capsule TAKE 1 CAPSULE BY MOUTH  DAILY 90 capsule 3  . Insulin Pen Needle 32G X 6 MM MISC Use with Saxenda daily 50 each 0  . Liraglutide -Weight Management (SAXENDA) 18 MG/3ML SOPN Inject into skin 0.6 mg daily 6 mL 0  . metFORMIN (GLUCOPHAGE) 500 MG tablet TAKE 1 TABLET BY MOUTH  TWICE DAILY WITH A MEAL 180 tablet 3  . Multiple Vitamins-Minerals (MULTIVITAMIN & MINERAL PO) Take 1 tablet by mouth daily.    . sertraline (ZOLOFT) 50 MG tablet Take 0.5 tablets (25 mg total) by mouth daily. 1/2 tab po daily x 7 days and then increase to 1 tab po daily 45 tablet 1  . telmisartan (MICARDIS) 20 MG tablet TAKE 1 TABLET BY MOUTH  DAILY 90 tablet 3  . Vitamin D, Ergocalciferol, (DRISDOL) 1.25 MG (50000 UNIT) CAPS capsule TAKE ONE CAPSULE BY MOUTH EVERY 7 DAYS 4 capsule 0  . Zinc 30 MG CAPS Take by mouth.     No current facility-administered medications on file prior to visit.    BP 122/70 (BP Location: Right Arm, Patient Position: Sitting, Cuff Size: Large)   Pulse 76   Temp 99 F (37.2 C) (Oral)   Resp 16   Ht $R'5\' 6"'dy$  (1.676 m)   Wt 266 lb (120.7 kg)   SpO2 100%   BMI 42.93 kg/m    Objective:   Physical Exam Constitutional:      Appearance: Normal appearance.  HENT:     Head: Normocephalic and atraumatic.     Right Ear: Tympanic membrane, ear canal and external  ear normal.     Left Ear: Tympanic membrane, ear canal and external ear normal.  Neck:     Comments: Mild tenderness to palpation beneath the left ear and behind the left ear Lymphadenopathy:     Cervical: No cervical adenopathy.  Neurological:     Mental Status: She is alert.           Assessment & Plan:  Musculoskeletal pain- ? Mild TMJ symptoms.  No sign of OM or Otitis externa.    Advised pt of plan as follows:  Begin ibuprofen as needed for moderate/severe pain. For less severe pain you may use tylenol. You may use flexeril at bedtime as needed (muscle relaxer).  Please call if new/worsening symptoms or if symptom are not improved in 1 week.  This visit occurred during the SARS-CoV-2 public health emergency.  Safety protocols were in place, including screening questions prior to the visit, additional usage of staff PPE, and extensive cleaning of exam room while observing appropriate contact time as indicated for disinfecting solutions.

## 2020-06-29 ENCOUNTER — Encounter (INDEPENDENT_AMBULATORY_CARE_PROVIDER_SITE_OTHER): Payer: Self-pay | Admitting: Bariatrics

## 2020-06-29 ENCOUNTER — Ambulatory Visit (INDEPENDENT_AMBULATORY_CARE_PROVIDER_SITE_OTHER): Payer: Medicare Other | Admitting: Bariatrics

## 2020-06-29 ENCOUNTER — Other Ambulatory Visit: Payer: Self-pay

## 2020-06-29 VITALS — BP 133/84 | HR 89 | Temp 98.5°F | Ht 66.0 in | Wt 263.0 lb

## 2020-06-29 DIAGNOSIS — E1159 Type 2 diabetes mellitus with other circulatory complications: Secondary | ICD-10-CM

## 2020-06-29 DIAGNOSIS — Z6841 Body Mass Index (BMI) 40.0 and over, adult: Secondary | ICD-10-CM

## 2020-06-29 DIAGNOSIS — I152 Hypertension secondary to endocrine disorders: Secondary | ICD-10-CM

## 2020-06-29 DIAGNOSIS — E1169 Type 2 diabetes mellitus with other specified complication: Secondary | ICD-10-CM

## 2020-06-29 MED ORDER — INSULIN PEN NEEDLE 32G X 6 MM MISC: 0 refills | Status: DC

## 2020-06-29 MED ORDER — VICTOZA 18 MG/3ML ~~LOC~~ SOPN: PEN_INJECTOR | SUBCUTANEOUS | 0 refills | Status: DC

## 2020-06-29 NOTE — Progress Notes (Signed)
Chief Complaint:   OBESITY [name] is here to discuss her progress with her obesity treatment plan along with follow-up of her obesity related diagnoses. [name] is on the Category 4 Plan and states she is following her eating plan approximately 50% of the time. [name] states she is walking for 20-30 minutes 3 times per week.  Today's visit was #: 35 Starting weight: 301 lbs Starting date: 07/21/2019 Today's weight: 263 lbs Today's date: 06/29/2020 Total lbs lost to date: 38 lbs Total lbs lost since last in-office visit: 0  Interim History: [name] is up 10 pounds since her last visit. She has been off [name].  She did "fall off the wagon".  Subjective:   1. Type 2 diabetes mellitus with other specified complication, without long-term current use of insulin (HCC) Stable.  Prescribed Victoza.  Start 0.6 mg subcutaneously daily for 7 days, then increase to 1.2 mg subcutaneously daily for 7 days.   Lab Results  Component Value Date   HGBA1C 6.2 (H) 04/05/2020   HGBA1C 6.0 (H) 11/13/2019   HGBA1C 6.4 (H) 07/21/2019   Lab Results  Component Value Date   LDLCALC 119 (H) 04/05/2020   CREATININE 0.81 04/05/2020   Lab Results  Component Value Date   INSULIN 19.2 04/05/2020   INSULIN 12.9 11/13/2019   INSULIN 35.9 (H) 07/21/2019   2. Hypertension associated with type 2 diabetes mellitus ([place]) Review: taking medications as instructed, no medication side effects noted, no chest pain on exertion, no dyspnea on exertion, no swelling of ankles.  Taking medications as directed.  BP Readings from Last 3 Encounters:  06/29/20 133/84  06/17/20 122/70  05/18/20 112/67   Assessment/Plan:   1. Type 2 diabetes mellitus with other specified complication, without long-term current use of insulin (HCC) Good blood sugar control is important to decrease the likelihood of diabetic complications such as nephropathy, neuropathy, limb loss, blindness, coronary artery disease, and death.  Intensive lifestyle modification including diet, exercise and weight loss are the first line of treatment for diabetes.  Continue medications.  Start Victoza.  - Start liraglutide (VICTOZA) 18 MG/3ML SOPN; Start 0.6mg  SQ once a day for 7 days, then increase to 1.2mg  once a day  Dispense: 6 mL; Refill: 0 - Insulin Pen Needle 32G X 6 MM MISC; Use with Saxenda daily  Dispense: 50 each; Refill: 0  2. Hypertension associated with type 2 diabetes mellitus ([place]) [name] is working on healthy weight loss and exercise to improve blood pressure control. We will watch for signs of hypotension as she continues her lifestyle modifications.  Continue medications.   3. Class 3 severe obesity with serious comorbidity and body mass index (BMI) of 40.0 to 44.9 in adult, unspecified obesity type (HCC)  Nature is currently in the action stage of change. As such, her goal is to continue with weight loss efforts. She has agreed to the Category 4 Plan.   She will work on being more adherent to the plan, meal planning, and increasing water intake.  Exercise goals: Will go back to the pool for exercise.  Behavioral modification strategies: increasing lean protein intake, decreasing simple carbohydrates, increasing vegetables, increasing water intake, decreasing eating out, no skipping meals, meal planning and cooking strategies, keeping healthy foods in the home and planning for success.  [name] has agreed to follow-up with our clinic in 2-3 weeks, 30 minutes early, fasting, for IC. She was informed of the importance of frequent follow-up visits to maximize her success with intensive lifestyle  modifications for her multiple health conditions.   Objective:   Blood pressure 133/84, pulse 89, temperature 98.5 F (36.9 C), height 5\' 6"  (1.676 m), weight 263 lb (119.3 kg), SpO2 97 %. Body mass index is 42.45 kg/m.  General: Cooperative, alert, well developed, in no acute distress. HEENT: Conjunctivae and lids  unremarkable. Cardiovascular: Regular rhythm.  Lungs: Normal work of breathing. Neurologic: No focal deficits.   Lab Results  Component Value Date   CREATININE 0.81 04/05/2020   BUN 15 04/05/2020   NA 140 04/05/2020   K 4.6 04/05/2020   CL 99 04/05/2020   CO2 30 (H) 04/05/2020   Lab Results  Component Value Date   ALT 13 04/05/2020   AST 14 04/05/2020   ALKPHOS 105 04/05/2020   BILITOT 0.4 04/05/2020   Lab Results  Component Value Date   HGBA1C 6.2 (H) 04/05/2020   HGBA1C 6.0 (H) 11/13/2019   HGBA1C 6.4 (H) 07/21/2019   HGBA1C 6.6 (H) 10/12/2017   HGBA1C 6.4 06/05/2017   Lab Results  Component Value Date   INSULIN 19.2 04/05/2020   INSULIN 12.9 11/13/2019   INSULIN 35.9 (H) 07/21/2019   Lab Results  Component Value Date   TSH 0.807 07/21/2019   Lab Results  Component Value Date   CHOL 184 04/05/2020   HDL 49 04/05/2020   LDLCALC 119 (H) 04/05/2020   LDLDIRECT 110.0 08/21/2016   TRIG 90 04/05/2020   CHOLHDL 4 10/12/2017   Lab Results  Component Value Date   WBC 9.3 09/19/2017   HGB 12.1 09/19/2017   HCT 36.6 09/19/2017   MCV 86.5 09/19/2017   PLT 260.0 09/19/2017   Obesity Behavioral Intervention:   Approximately 15 minutes were spent on the discussion below.  ASK: We discussed the diagnosis of obesity with [name] today and [name] agreed to give [name] permission to discuss obesity behavioral modification therapy today.  ASSESS: [name] has the diagnosis of obesity and her BMI today is 42.6. [name] is in the action stage of change.   ADVISE: [name] was educated on the multiple health risks of obesity as well as the benefit of weight loss to improve her health. She was advised of the need for long term treatment and the importance of lifestyle modifications to improve her current health and to decrease her risk of future health problems.  AGREE: Multiple dietary modification options and treatment options were discussed and [name] agreed to follow  the recommendations documented in the above note.  ARRANGE: [name] was educated on the importance of frequent visits to treat obesity as outlined per CMS and USPSTF guidelines and agreed to schedule her next follow up appointment today.  Attestation Statements:   Reviewed by clinician on day of visit: allergies, medications, problem list, medical history, surgical history, family history, social history, and previous encounter notes.  I, Water quality scientist, CMA, am acting as Location manager for [employer], DO  I have reviewed the above documentation for accuracy and completeness, and I agree with the above. [name], DO

## 2020-06-30 ENCOUNTER — Encounter (INDEPENDENT_AMBULATORY_CARE_PROVIDER_SITE_OTHER): Payer: Self-pay | Admitting: Bariatrics

## 2020-07-07 ENCOUNTER — Ambulatory Visit (HOSPITAL_BASED_OUTPATIENT_CLINIC_OR_DEPARTMENT_OTHER)
Admission: RE | Admit: 2020-07-07 | Discharge: 2020-07-07 | Disposition: A | Discharge status: Home / Self Care | Payer: Medicare Other | Source: Ambulatory Visit | Attending: Internal Medicine | Admitting: Internal Medicine

## 2020-07-07 ENCOUNTER — Ambulatory Visit (INDEPENDENT_AMBULATORY_CARE_PROVIDER_SITE_OTHER): Payer: Medicare Other | Admitting: Internal Medicine

## 2020-07-07 ENCOUNTER — Other Ambulatory Visit: Payer: Self-pay

## 2020-07-07 ENCOUNTER — Encounter: Payer: Self-pay | Admitting: Internal Medicine

## 2020-07-07 VITALS — BP 115/78 | HR 89 | Temp 99.1°F | Resp 18 | Ht 66.0 in | Wt 269.1 lb

## 2020-07-07 DIAGNOSIS — M109 Gout, unspecified: Secondary | ICD-10-CM | POA: Insufficient documentation

## 2020-07-07 DIAGNOSIS — M7731 Calcaneal spur, right foot: Secondary | ICD-10-CM | POA: Diagnosis not present

## 2020-07-07 DIAGNOSIS — M7989 Other specified soft tissue disorders: Secondary | ICD-10-CM | POA: Diagnosis not present

## 2020-07-07 DIAGNOSIS — M19071 Primary osteoarthritis, right ankle and foot: Secondary | ICD-10-CM | POA: Diagnosis not present

## 2020-07-07 MED ORDER — PREDNISONE 20 MG PO TABS: 20.0000 mg | ORAL_TABLET | Freq: Every day | ORAL | 0 refills | Status: DC

## 2020-07-07 MED ORDER — COLCHICINE 0.6 MG PO CAPS: 0.6000 mg | ORAL_CAPSULE | Freq: Two times a day (BID) | ORAL | 0 refills | Status: DC | PRN

## 2020-07-07 NOTE — Progress Notes (Signed)
Subjective:    Patient ID: [name], female    DOB: March 09, 1956, 65 y.o.   MRN: [id number]  DOS:  07/07/2020 Type of visit - description: Acute For few weeks she has been hurting at the base of the right great toe.  Pain has been definitely worse over the last week. Is keeping her awake at night. Area has been slightly swollen. Voltaren gel seems to help.  Denies injury, no fever chills, no discharge.  Review of Systems See above   Past Medical History:  Diagnosis Date  . Allergic rhinitis   . Allergy   . Anemia 07/17/2016  . Anxiety   . Asthma    seasonal  . Blood transfusion without reported diagnosis   . Breast cancer ([place])    right  . Bronchitis   . Colon polyps   . CPAP (continuous positive airway pressure) dependence   . Depression   . Diabetes mellitus    borderline but takes metformin  . Edema   . Family history of breast cancer in first degree relative   . Fibromyalgia   . GERD (gastroesophageal reflux disease)   . Hyperglycemia 03/05/2017  . Hyperlipidemia 08/21/2016   no meds  . Hyperparathyroidism   . Hypertension    controlled by medications  . Joint pain   . Neuromuscular disorder (HCC)    Fibromyalgia  . Obesity   . Osteoarthritis    RA  . PONV (postoperative nausea and vomiting)    occasional  . Pre-diabetes   . Sleep apnea    wears cpap  . Viral meningitis   . Vitamin D deficiency     Past Surgical History:  Procedure Laterality Date  . ABDOMINAL HYSTERECTOMY  1998  . ADENOIDECTOMY    . BREAST EXCISIONAL BIOPSY Left 1993   benign  . BREAST SURGERY     Tran flap due to breast cancer  . CESAREAN SECTION  1988  . COLONOSCOPY  08/14/2011  . HAND SURGERY     dog bite, right hand  . HAND SURGERY     trauma, left hand  . KNEE ARTHROSCOPY     bilateral  . left knee replacement  2010  . MASTECTOMY  01/13/94   Right breast  . parathyroid resection    . PARATHYROIDECTOMY    . POLYPECTOMY    . rectal abscess    . SEPTOPLASTY   1980  . SHOULDER ARTHROSCOPY Right 11/09/2015   Procedure: ARTHROSCOPY SHOULDER-acromioplasty, distal clavicle resection and debridement;  Surgeon: Melrose Nakayama, MD;  Location: [facility];  Service: Orthopedics;  Laterality: Right;  . shoulder arthroscopy     rotator cuff repair  . TOE SURGERY Right    paronychia and adenoma removed  . TONSILLECTOMY    . TOTAL KNEE ARTHROPLASTY  06/18/08   Daldorf  . TUMOR REMOVAL  1982   , scalp    Allergies as of 07/07/2020      Reactions   Allopurinol Hives   Mucinex [guaifenesin Er] Shortness Of Breath   Penicillins Hives, Other (See Comments)   Has patient had a PCN reaction causing immediate rash, facial/tongue/throat swelling, SOB or lightheadedness with hypotension: no Has patient had a PCN reaction causing severe rash involving mucus membranes or skin necrosis: no Has patient had a PCN reaction that required hospitalization no Has patient had a PCN reaction occurring within the last 10 years: no If all of the above answers are "NO", then may proceed with Cephalosporin use.   Prozac [fluoxetine  Hcl] Hives   Amitriptyline Other (See Comments)   "felt weird, fatigue, dizziness"   Lexapro [escitalopram Oxalate]    Nausea and hypersalivation.       Medication List       Accurate as of July 07, 2020 11:59 PM. If you have any questions, ask your nurse or doctor.        atorvastatin 10 MG tablet Commonly known as: LIPITOR Take 1 tablet (10 mg total) by mouth 2 (two) times a week.   calcium carbonate 1500 (600 Ca) MG Tabs tablet Commonly known as: OSCAL Take 1,500 mg by mouth daily.   Colchicine 0.6 MG Caps Commonly known as: Mitigare Take 0.6 mg by mouth 2 (two) times daily as needed. Started by: Kathlene November, MD   cyclobenzaprine 5 MG tablet Commonly known as: FLEXERIL Take 1 tablet (5 mg total) by mouth at bedtime as needed for muscle spasms.   diclofenac Sodium 1 % Gel Commonly known as: VOLTAREN diclofenac 1 % topical gel   APPLY A SMALL AMOUNT TO AFFECTED AREA FOUR TIMES A DAY AS NEEDED FOR PAIN   famotidine 40 MG tablet Commonly known as: PEPCID Take 1 tablet (40 mg total) by mouth daily.   fluticasone 50 MCG/ACT nasal spray Commonly known as: FLONASE Place 2 sprays into both nostrils daily.   furosemide 40 MG tablet Commonly known as: LASIX TAKE 1 TABLET BY MOUTH  TWICE DAILY   glucosamine-chondroitin 500-400 MG tablet Take 2 tablets by mouth 2 (two) times daily.   hydrochlorothiazide 12.5 MG capsule Commonly known as: MICROZIDE TAKE 1 CAPSULE BY MOUTH  DAILY   ibuprofen 600 MG tablet Commonly known as: ADVIL Take 1 tablet (600 mg total) by mouth every 8 (eight) hours as needed for moderate pain.   Insulin Pen Needle 32G X 6 MM Misc Use with Saxenda daily   metFORMIN 500 MG tablet Commonly known as: GLUCOPHAGE TAKE 1 TABLET BY MOUTH  TWICE DAILY WITH A MEAL   MULTIVITAMIN & MINERAL PO Take 1 tablet by mouth daily.   predniSONE 20 MG tablet Commonly known as: DELTASONE Take 1 tablet (20 mg total) by mouth daily with breakfast. Started by: Kathlene November, MD   sertraline 50 MG tablet Commonly known as: ZOLOFT Take 0.5 tablets (25 mg total) by mouth daily. 1/2 tab po daily x 7 days and then increase to 1 tab po daily   telmisartan 20 MG tablet Commonly known as: MICARDIS TAKE 1 TABLET BY MOUTH  DAILY   Victoza 18 MG/3ML Sopn Generic drug: liraglutide Start 0.6mg  SQ once a day for 7 days, then increase to 1.2mg  once a day   Vitamin D (Ergocalciferol) 1.25 MG (50000 UNIT) Caps capsule Commonly known as: DRISDOL TAKE ONE CAPSULE BY MOUTH EVERY 7 DAYS   Zinc 30 MG Caps Take by mouth.          Objective:   Physical Exam BP 115/78 (BP Location: Left Arm, Patient Position: Sitting, Cuff Size: Normal)   Pulse 89   Temp 99.1 F (37.3 C) (Oral)   Resp 18   Ht 5\' 6"  (1.676 m)   Wt 269 lb 2 oz (122.1 kg)   SpO2 97%   BMI 43.44 kg/m  General:   Well developed, NAD, BMI  noted. HEENT:  Normocephalic . Face symmetric, atraumatic MSK:  Left foot normal Right foot: Normal except for the first metatarsal phalange joint: Minimal swelling, + TTP.  Not warm, not red.  Passive motion with minimal pain. No ingrown  toenail.  Good capillary refill. Neurologic:  alert & oriented X3.  Speech normal, gait appropriate for age and unassisted Psych--  Cognition and judgment appear intact.  Cooperative with normal attention span and concentration.  Behavior appropriate. No anxious or depressed appearing.      Assessment      65 year old female, PMH includes DM, HTN, high cholesterol, GERD, RA, morbid obesity, OSA, presents with:  Pain, first great toe: DDx: Suspect gout, DJD versus others. She did have surgery before and a great toe. Plan: X-ray, low-dose prednisone, colchicine.  Call if not better. Watch CBGs, stop prednisone if they are more than 180. Ice, continue  Voltaren gel.  This visit occurred during the SARS-CoV-2 public health emergency.  Safety protocols were in place, including screening questions prior to the visit, additional usage of staff PPE, and extensive cleaning of exam room while observing appropriate contact time as indicated for disinfecting solutions.

## 2020-07-07 NOTE — Progress Notes (Signed)
Pre visit review using our clinic review tool, if applicable. No additional management support is needed unless otherwise documented below in the visit note. 

## 2020-07-07 NOTE — Patient Instructions (Addendum)
Go to the first floor , get a x-ray  Continue using Voltaren gel  Ice  Take prednisone for 5 days, stop it if your blood sugars are more than 180  Take colchicine twice a day until the pain is better.  If not gradually better let us know

## 2020-07-12 ENCOUNTER — Telehealth: Payer: Self-pay | Admitting: Pulmonary Disease

## 2020-07-12 ENCOUNTER — Other Ambulatory Visit: Payer: Self-pay | Admitting: Obstetrics and Gynecology

## 2020-07-12 DIAGNOSIS — Z1231 Encounter for screening mammogram for malignant neoplasm of breast: Secondary | ICD-10-CM

## 2020-07-12 DIAGNOSIS — G4733 Obstructive sleep apnea (adult) (pediatric): Secondary | ICD-10-CM

## 2020-07-12 NOTE — Telephone Encounter (Signed)
Spoke with patient. She stated that she finally got in touch with [facility] last week about her cpap machine. The order was placed back in August 2021. They stated the order is now expired and she will require another OV and order. Patient is highly upset for the long wait for a response.   She is requesting to have another order placed to another DME. She had wanted to switch to [facility] last year but gave [facility] another chance.  RA, please advise if you are ok with her switching to another DME company. Thanks!

## 2020-07-13 NOTE — Telephone Encounter (Signed)
I have called and spoke with pt and she is aware of order placed for [facility] for her cpap machine.  She is aware to reach back out to [name] if she does not hear from them in the next week or so.

## 2020-07-13 NOTE — Telephone Encounter (Signed)
OK to switch DME from [facility] to [facility]

## 2020-07-20 ENCOUNTER — Telehealth (INDEPENDENT_AMBULATORY_CARE_PROVIDER_SITE_OTHER): Payer: Medicare Other | Admitting: Family Medicine

## 2020-07-20 ENCOUNTER — Other Ambulatory Visit: Payer: Self-pay

## 2020-07-20 ENCOUNTER — Encounter (INDEPENDENT_AMBULATORY_CARE_PROVIDER_SITE_OTHER): Payer: Self-pay | Admitting: Bariatrics

## 2020-07-20 ENCOUNTER — Ambulatory Visit (INDEPENDENT_AMBULATORY_CARE_PROVIDER_SITE_OTHER): Payer: Medicare Other | Admitting: Bariatrics

## 2020-07-20 VITALS — BP 117/81 | HR 85 | Temp 98.6°F | Ht 66.0 in | Wt 264.0 lb

## 2020-07-20 DIAGNOSIS — R0602 Shortness of breath: Secondary | ICD-10-CM

## 2020-07-20 DIAGNOSIS — E1159 Type 2 diabetes mellitus with other circulatory complications: Secondary | ICD-10-CM | POA: Diagnosis not present

## 2020-07-20 DIAGNOSIS — E782 Mixed hyperlipidemia: Secondary | ICD-10-CM

## 2020-07-20 DIAGNOSIS — M26622 Arthralgia of left temporomandibular joint: Secondary | ICD-10-CM | POA: Diagnosis not present

## 2020-07-20 DIAGNOSIS — E669 Obesity, unspecified: Secondary | ICD-10-CM

## 2020-07-20 DIAGNOSIS — M25542 Pain in joints of left hand: Secondary | ICD-10-CM | POA: Diagnosis not present

## 2020-07-20 DIAGNOSIS — E1169 Type 2 diabetes mellitus with other specified complication: Secondary | ICD-10-CM

## 2020-07-20 DIAGNOSIS — Z6841 Body Mass Index (BMI) 40.0 and over, adult: Secondary | ICD-10-CM

## 2020-07-20 DIAGNOSIS — M25541 Pain in joints of right hand: Secondary | ICD-10-CM | POA: Diagnosis not present

## 2020-07-20 DIAGNOSIS — I10 Essential (primary) hypertension: Secondary | ICD-10-CM | POA: Diagnosis not present

## 2020-07-20 DIAGNOSIS — I152 Hypertension secondary to endocrine disorders: Secondary | ICD-10-CM

## 2020-07-20 DIAGNOSIS — E559 Vitamin D deficiency, unspecified: Secondary | ICD-10-CM

## 2020-07-20 DIAGNOSIS — R5383 Other fatigue: Secondary | ICD-10-CM

## 2020-07-20 MED ORDER — CYCLOBENZAPRINE HCL 5 MG PO TABS: 5.0000 mg | ORAL_TABLET | Freq: Every evening | ORAL | 0 refills | Status: DC | PRN

## 2020-07-20 MED ORDER — MELOXICAM 15 MG PO TABS: 15.0000 mg | ORAL_TABLET | Freq: Every day | ORAL | 1 refills | Status: DC | PRN

## 2020-07-21 LAB — LIPID PANEL WITH LDL/HDL RATIO
Cholesterol, Total: 151 mg/dL (ref 100–199)
HDL: 50 mg/dL (ref >39)
LDL Chol Calc (NIH): 81 mg/dL (ref 0–99)
LDL/HDL Ratio: 1.6 ratio (ref 0.0–3.2)
Triglycerides: 108 mg/dL (ref 0–149)
VLDL Cholesterol Cal: 20 mg/dL (ref 5–40)

## 2020-07-21 LAB — VITAMIN D 25 HYDROXY (VIT D DEFICIENCY, FRACTURES): Vit D, 25-Hydroxy: 53.9 ng/mL (ref 30.0–100.0)

## 2020-07-21 LAB — COMPREHENSIVE METABOLIC PANEL
ALT: 17 IU/L (ref 0–32)
AST: 20 IU/L (ref 0–40)
Albumin/Globulin Ratio: 1.6 (ref 1.2–2.2)
Albumin: 4.4 g/dL (ref 3.8–4.8)
Alkaline Phosphatase: 79 IU/L (ref 44–121)
BUN/Creatinine Ratio: 15 (ref 12–28)
BUN: 14 mg/dL (ref 8–27)
Bilirubin Total: 0.4 mg/dL (ref 0.0–1.2)
CO2: 25 mmol/L (ref 20–29)
Calcium: 9.8 mg/dL (ref 8.7–10.3)
Chloride: 100 mmol/L (ref 96–106)
Creatinine, Ser: 0.91 mg/dL (ref 0.57–1.00)
GFR calc Af Amer: 77 mL/min/{1.73_m2} (ref >59)
GFR calc non Af Amer: 66 mL/min/{1.73_m2} (ref >59)
Globulin, Total: 2.7 g/dL (ref 1.5–4.5)
Glucose: 110 mg/dL — ABNORMAL HIGH (ref 65–99)
Potassium: 4.4 mmol/L (ref 3.5–5.2)
Sodium: 142 mmol/L (ref 134–144)
Total Protein: 7.1 g/dL (ref 6.0–8.5)

## 2020-07-21 LAB — HEMOGLOBIN A1C
Est. average glucose Bld gHb Est-mCnc: 128 mg/dL
Hgb A1c MFr Bld: 6.1 % — ABNORMAL HIGH (ref 4.8–5.6)

## 2020-07-21 LAB — INSULIN, RANDOM: INSULIN: 17.8 u[IU]/mL (ref 2.6–24.9)

## 2020-07-21 NOTE — Progress Notes (Signed)
Patient ID: [name], female   DOB: [birth year], 65 y.o.   MRN: [id number] Virtual Visit via phone Note  I connected with [name] on 07/20/20 at  2:40 PM EST by a phone enabled telemedicine application and verified that I am speaking with the correct person using two identifiers.  Location: Patient: home, patient and provider in visit Provider: office   I discussed the limitations of evaluation and management by telemedicine and the availability of in person appointments. The patient expressed understanding and agreed to proceed. S Chism, CMA  was able to get the patient set up on a phone visit after being unable to set up a video visit   Subjective:    Patient ID: [name], female    DOB: Nov 15, 1955, 65 y.o.   MRN: [id number]  Chief Complaint  Patient presents with  . Follow-up  . Hypertension    HPI Patient is in today for follow up on chronic medical concerns and concerns of increasing pain in her hands and toes. She also notes some nodules on her hands. Some warmth and swelling noted. Denies CP/palp/SOB/HA/congestion/fevers/GI or GU c/o. Taking meds as prescribed  Past Medical History:  Diagnosis Date  . Allergic rhinitis   . Allergy   . Anemia 07/17/2016  . Anxiety   . Asthma    seasonal  . Blood transfusion without reported diagnosis   . Breast cancer ([place])    right  . Bronchitis   . Colon polyps   . CPAP (continuous positive airway pressure) dependence   . Depression   . Diabetes mellitus    borderline but takes metformin  . Edema   . Family history of breast cancer in first degree relative   . Fibromyalgia   . GERD (gastroesophageal reflux disease)   . Hyperglycemia 03/05/2017  . Hyperlipidemia 08/21/2016   no meds  . Hyperparathyroidism   . Hypertension    controlled by medications  . Joint pain   . Neuromuscular disorder (HCC)    Fibromyalgia  . Obesity   . Osteoarthritis    RA  . PONV (postoperative nausea and  vomiting)    occasional  . Pre-diabetes   . Sleep apnea    wears cpap  . Viral meningitis   . Vitamin D deficiency     Past Surgical History:  Procedure Laterality Date  . ABDOMINAL HYSTERECTOMY  1998  . ADENOIDECTOMY    . BREAST EXCISIONAL BIOPSY Left 1993   benign  . BREAST SURGERY     Tran flap due to breast cancer  . CESAREAN SECTION  1988  . COLONOSCOPY  08/14/2011  . HAND SURGERY     dog bite, right hand  . HAND SURGERY     trauma, left hand  . KNEE ARTHROSCOPY     bilateral  . left knee replacement  2010  . MASTECTOMY  01/13/94   Right breast  . parathyroid resection    . PARATHYROIDECTOMY    . POLYPECTOMY    . rectal abscess    . SEPTOPLASTY  1980  . SHOULDER ARTHROSCOPY Right 11/09/2015   Procedure: ARTHROSCOPY SHOULDER-acromioplasty, distal clavicle resection and debridement;  Surgeon: Melrose Nakayama, MD;  Location: [facility];  Service: Orthopedics;  Laterality: Right;  . shoulder arthroscopy     rotator cuff repair  . TOE SURGERY Right    paronychia and adenoma removed  . TONSILLECTOMY    . TOTAL KNEE ARTHROPLASTY  06/18/08   Daldorf  . TUMOR REMOVAL  1982   ,  scalp    Family History  Problem Relation Age of Onset  . Emphysema Father   . Lung cancer Father        lung ca dx 40  . Other Father        prostate issues  . Diabetes Father   . Breast cancer Maternal Aunt        dx late 72s  . Colon cancer Maternal Aunt        dx 70s  . Colon polyps Maternal Aunt   . Prostate cancer Maternal Grandfather 85       d. 85y  . Heart disease Paternal Grandfather   . Heart attack Paternal Grandfather        d. 50y  . Diabetes Paternal Grandfather   . Asthma Daughter        "seasonal"  . Arthritis Maternal Grandmother   . Aneurysm Maternal Grandmother        d. brain aneurysm at 70  . Arthritis Mother   . Colon polyps Mother   . Hypertension Mother   . Sleep apnea Mother   . Multiple sclerosis Sister   . Breast cancer Sister 17       L IDC and DCIS;  ER/PR+, Her2-  . Fibrocystic breast disease Sister   . Arthritis/Rheumatoid Sister   . Breast cancer Sister   . Cancer Maternal Uncle        d. mouth cancer at younger age; smoker  . Other Paternal Uncle        muscle issues - couldn't walk or talk; d. 20y  . Cancer Maternal Aunt        lymphoma, dx 52s  . Ovarian cancer Maternal Aunt        dx 26s; d. late 63s  . Lung cancer Maternal Uncle        d. 38y; former smoker  . Throat cancer Cousin        maternal 1st cousin; smoker  . Breast cancer Cousin 62       maternal 1st cousin  . Other Paternal Uncle        prostate issues  . Breast cancer Cousin        paternal 1st cousin dx late 58s  . Throat cancer Cousin        maternal 1st cousin; used SL tobacco  . Prostate cancer Cousin        maternal 1st cousin dx 16s  . Esophageal cancer Neg Hx   . Rectal cancer Neg Hx   . Stomach cancer Neg Hx     Social History   Socioeconomic History  . Marital status: Widowed    Spouse name: Not on file  . Number of children: 1  . Years of education: Not on file  . Highest education level: 12th grade  Occupational History  . Occupation: ACCOUNTING    Employer: [employer]  Tobacco Use  . Smoking status: Never Smoker  . Smokeless tobacco: Never Used  Vaping Use  . Vaping Use: Never used  Substance and Sexual Activity  . Alcohol use: Not Currently  . Drug use: No  . Sexual activity: Not on file  Other Topics Concern  . Not on file  Social History Narrative  . Not on file   Social Determinants of Health   Financial Resource Strain: Not on file  Food Insecurity: Not on file  Transportation Needs: Not on file  Physical Activity: Not on file  Stress: Not on file  Social Connections: Not on file  Intimate Partner Violence: Not on file    Outpatient Medications Prior to Visit  Medication Sig Dispense Refill  . atorvastatin (LIPITOR) 10 MG tablet Take 1 tablet (10 mg total) by mouth 2 (two) times a week. 10 tablet 3  .  calcium carbonate (OSCAL) 1500 (600 Ca) MG TABS tablet Take 1,500 mg by mouth daily.    . Colchicine (MITIGARE) 0.6 MG CAPS Take 0.6 mg by mouth 2 (two) times daily as needed. 60 capsule 0  . fluticasone (FLONASE) 50 MCG/ACT nasal spray Place 2 sprays into both nostrils daily.    . furosemide (LASIX) 40 MG tablet TAKE 1 TABLET BY MOUTH  TWICE DAILY 180 tablet 3  . glucosamine-chondroitin 500-400 MG tablet Take 2 tablets by mouth 2 (two) times daily.    . hydrochlorothiazide (MICROZIDE) 12.5 MG capsule TAKE 1 CAPSULE BY MOUTH  DAILY 90 capsule 3  . Insulin Pen Needle 32G X 6 MM MISC Use with Saxenda daily 50 each 0  . liraglutide (VICTOZA) 18 MG/3ML SOPN Start 0.35m SQ once a day for 7 days, then increase to 1.259monce a day 6 mL 0  . metFORMIN (GLUCOPHAGE) 500 MG tablet TAKE 1 TABLET BY MOUTH  TWICE DAILY WITH A MEAL 180 tablet 3  . Multiple Vitamins-Minerals (MULTIVITAMIN & MINERAL PO) Take 1 tablet by mouth daily.    . predniSONE (DELTASONE) 20 MG tablet Take 1 tablet (20 mg total) by mouth daily with breakfast. 5 tablet 0  . sertraline (ZOLOFT) 50 MG tablet Take 0.5 tablets (25 mg total) by mouth daily. 1/2 tab po daily x 7 days and then increase to 1 tab po daily 45 tablet 1  . telmisartan (MICARDIS) 20 MG tablet TAKE 1 TABLET BY MOUTH  DAILY 90 tablet 3  . Vitamin D, Ergocalciferol, (DRISDOL) 1.25 MG (50000 UNIT) CAPS capsule TAKE ONE CAPSULE BY MOUTH EVERY 7 DAYS 4 capsule 0  . Zinc 30 MG CAPS Take by mouth.    . cyclobenzaprine (FLEXERIL) 5 MG tablet Take 1 tablet (5 mg total) by mouth at bedtime as needed for muscle spasms. 15 tablet 0  . ibuprofen (ADVIL) 600 MG tablet Take 1 tablet (600 mg total) by mouth every 8 (eight) hours as needed for moderate pain. 15 tablet 0  . diclofenac Sodium (VOLTAREN) 1 % GEL diclofenac 1 % topical gel  APPLY A SMALL AMOUNT TO AFFECTED AREA FOUR TIMES A DAY AS NEEDED FOR PAIN    . famotidine (PEPCID) 40 MG tablet Take 1 tablet (40 mg total) by mouth daily.  30 tablet 5   No facility-administered medications prior to visit.    Allergies  Allergen Reactions  . Allopurinol Hives  . Mucinex [Guaifenesin Er] Shortness Of Breath  . Penicillins Hives and Other (See Comments)    Has patient had a PCN reaction causing immediate rash, facial/tongue/throat swelling, SOB or lightheadedness with hypotension: no Has patient had a PCN reaction causing severe rash involving mucus membranes or skin necrosis: no Has patient had a PCN reaction that required hospitalization no Has patient had a PCN reaction occurring within the last 10 years: no If all of the above answers are "NO", then may proceed with Cephalosporin use.   . Prozac [Fluoxetine Hcl] Hives  . Amitriptyline Other (See Comments)    "felt weird, fatigue, dizziness"  . Lexapro [Escitalopram Oxalate]     Nausea and hypersalivation.     Review of Systems  Constitutional: Negative for fever and  malaise/fatigue.  HENT: Negative for congestion.   Eyes: Negative for blurred vision.  Respiratory: Negative for shortness of breath.   Cardiovascular: Negative for chest pain, palpitations and leg swelling.  Gastrointestinal: Negative for abdominal pain, blood in stool and nausea.  Genitourinary: Negative for dysuria and frequency.  Musculoskeletal: Positive for joint pain. Negative for falls.  Skin: Negative for rash.  Neurological: Negative for dizziness, loss of consciousness and headaches.  Endo/Heme/Allergies: Negative for environmental allergies.  Psychiatric/Behavioral: Negative for depression. The patient is not nervous/anxious.        Objective:    Physical Exam unable to see via phone visit  There were no vitals taken for this visit. Wt Readings from Last 3 Encounters:  07/20/20 264 lb (119.7 kg)  07/07/20 269 lb 2 oz (122.1 kg)  06/29/20 263 lb (119.3 kg)    Diabetic Foot Exam - Simple   No data filed    Lab Results  Component Value Date   WBC 9.3 09/19/2017   HGB 12.1  09/19/2017   HCT 36.6 09/19/2017   PLT 260.0 09/19/2017   GLUCOSE 110 (H) 07/20/2020   CHOL 151 07/20/2020   TRIG 108 07/20/2020   HDL 50 07/20/2020   LDLDIRECT 110.0 08/21/2016   LDLCALC 81 07/20/2020   ALT 17 07/20/2020   AST 20 07/20/2020   NA 142 07/20/2020   K 4.4 07/20/2020   CL 100 07/20/2020   CREATININE 0.91 07/20/2020   BUN 14 07/20/2020   CO2 25 07/20/2020   TSH 0.807 07/21/2019   INR 2.0 (H) 06/21/2008   HGBA1C 6.1 (H) 07/20/2020    Lab Results  Component Value Date   TSH 0.807 07/21/2019   Lab Results  Component Value Date   WBC 9.3 09/19/2017   HGB 12.1 09/19/2017   HCT 36.6 09/19/2017   MCV 86.5 09/19/2017   PLT 260.0 09/19/2017   Lab Results  Component Value Date   NA 142 07/20/2020   K 4.4 07/20/2020   CO2 25 07/20/2020   GLUCOSE 110 (H) 07/20/2020   BUN 14 07/20/2020   CREATININE 0.91 07/20/2020   BILITOT 0.4 07/20/2020   ALKPHOS 79 07/20/2020   AST 20 07/20/2020   ALT 17 07/20/2020   PROT 7.1 07/20/2020   ALBUMIN 4.4 07/20/2020   CALCIUM 9.8 07/20/2020   ANIONGAP 11 11/09/2015   GFR 59.72 (L) 10/21/2019   Lab Results  Component Value Date   CHOL 151 07/20/2020   Lab Results  Component Value Date   HDL 50 07/20/2020   Lab Results  Component Value Date   LDLCALC 81 07/20/2020   Lab Results  Component Value Date   TRIG 108 07/20/2020   Lab Results  Component Value Date   CHOLHDL 4 10/12/2017   Lab Results  Component Value Date   HGBA1C 6.1 (H) 07/20/2020       Assessment & Plan:   Problem List Items Addressed This Visit    Vitamin D deficiency    Supplement and monitor      Essential hypertension    Monitor and report any concerns, no changes to meds. Encouraged heart healthy diet such as the DASH diet and exercise as tolerated.       Arthralgia - Primary    Hands and toes, she notes some swelling and nodules. Check labs and try Meloxicam 15 mg daily and Flexeril 5 mg prn. Consider referral if pain persists.        Relevant Orders   Uric acid  Antinuclear Antib (ANA)   Sedimentation rate   CRP High sensitivity   Rheumatoid Factor   TMJ arthralgia    Encouraged to discuss with dentist and to try applying voltaren and lidocaine gel to joint.          I have discontinued [name]'s ibuprofen. I am also having her start on meloxicam. Additionally, I am having her maintain her calcium carbonate, Multiple Vitamins-Minerals (MULTIVITAMIN & MINERAL PO), famotidine, Zinc, glucosamine-chondroitin, telmisartan, Vitamin D (Ergocalciferol), atorvastatin, sertraline, furosemide, metFORMIN, fluticasone, diclofenac Sodium, hydrochlorothiazide, Victoza, Insulin Pen Needle, predniSONE, Colchicine, and cyclobenzaprine.  Meds ordered this encounter  Medications  . meloxicam (MOBIC) 15 MG tablet    Sig: Take 1 tablet (15 mg total) by mouth daily as needed for pain.    Dispense:  30 tablet    Refill:  1  . cyclobenzaprine (FLEXERIL) 5 MG tablet    Sig: Take 1 tablet (5 mg total) by mouth at bedtime as needed for muscle spasms.    Dispense:  15 tablet    Refill:  0     I discussed the assessment and treatment plan with the patient. The patient was provided an opportunity to ask questions and all were answered. The patient agreed with the plan and demonstrated an understanding of the instructions.   The patient was advised to call back or seek an in-person evaluation if the symptoms worsen or if the condition fails to improve as anticipated.  I provided 25 minutes of non-face-to-face time during this encounter.   Penni Homans, MD

## 2020-07-21 NOTE — Assessment & Plan Note (Signed)
Encouraged to discuss with dentist and to try applying voltaren and lidocaine gel to joint.

## 2020-07-21 NOTE — Assessment & Plan Note (Signed)
Hands and toes, she notes some swelling and nodules. Check labs and try Meloxicam 15 mg daily and Flexeril 5 mg prn. Consider referral if pain persists.

## 2020-07-21 NOTE — Assessment & Plan Note (Signed)
Supplement and monitor 

## 2020-07-21 NOTE — Assessment & Plan Note (Signed)
Monitor and report any concerns, no changes to meds. Encouraged heart healthy diet such as the DASH diet and exercise as tolerated.  ?

## 2020-07-22 ENCOUNTER — Other Ambulatory Visit (INDEPENDENT_AMBULATORY_CARE_PROVIDER_SITE_OTHER): Payer: Medicare Other

## 2020-07-22 ENCOUNTER — Encounter (INDEPENDENT_AMBULATORY_CARE_PROVIDER_SITE_OTHER): Payer: Self-pay | Admitting: Bariatrics

## 2020-07-22 ENCOUNTER — Other Ambulatory Visit: Payer: Self-pay

## 2020-07-22 DIAGNOSIS — M25541 Pain in joints of right hand: Secondary | ICD-10-CM

## 2020-07-22 DIAGNOSIS — M25542 Pain in joints of left hand: Secondary | ICD-10-CM

## 2020-07-22 LAB — URIC ACID: Uric Acid, Serum: 6.5 mg/dL (ref 2.4–7.0)

## 2020-07-22 LAB — SEDIMENTATION RATE: Sed Rate: 19 mm/hr (ref 0–30)

## 2020-07-22 LAB — HIGH SENSITIVITY CRP: CRP, High Sensitivity: 1.42 mg/L (ref 0.000–5.000)

## 2020-07-22 NOTE — Progress Notes (Signed)
Chief Complaint:   OBESITY Chelesea is here to discuss her progress with her obesity treatment plan along with follow-up of her obesity related diagnoses. Berneice is on the Category 4 Plan and states she is following her eating plan approximately 80% of the time. Ailene states she is walking for 60 minutes 2 times per week.  Today's visit was #: 13 Starting weight: 301 lbs Starting date: 07/21/2019 Today's weight: 264 lbs Today's date: 07/20/2020 Total lbs lost to date: 37 lbs Total lbs lost since last in-office visit: 0  Interim History: Lene is up 1 pound since her last visit.  She was started on the Victoza and had increased flatus and sore stomach.  Subjective:   1. Other fatigue and SOB Ariyah has has more fatigue and SOB than usual.  2. Type 2 diabetes mellitus with obesity ([place]) Well-controlled.  Lab Results  Component Value Date   HGBA1C 6.1 (H) 07/20/2020   HGBA1C 6.2 (H) 04/05/2020   HGBA1C 6.0 (H) 11/13/2019   Lab Results  Component Value Date   LDLCALC 81 07/20/2020   CREATININE 0.91 07/20/2020   Lab Results  Component Value Date   INSULIN 17.8 07/20/2020   INSULIN 19.2 04/05/2020   INSULIN 12.9 11/13/2019   INSULIN 35.9 (H) 07/21/2019   3. Hypertension associated with type 2 diabetes mellitus ([place]) Controlled.  Review: taking medications as instructed, no medication side effects noted, no chest pain on exertion, no dyspnea on exertion, no swelling of ankles.    BP Readings from Last 3 Encounters:  07/20/20 117/81  07/07/20 115/78  06/29/20 133/84   4. Mixed hyperlipidemia Marveline has hyperlipidemia and has been trying to improve her cholesterol levels with intensive lifestyle modification including a low saturated fat diet, exercise and weight loss. She denies any chest pain, claudication or myalgias.  Lab Results  Component Value Date   ALT 17 07/20/2020   AST 20 07/20/2020   ALKPHOS 79 07/20/2020   BILITOT 0.4 07/20/2020   Lab  Results  Component Value Date   CHOL 151 07/20/2020   HDL 50 07/20/2020   LDLCALC 81 07/20/2020   LDLDIRECT 110.0 08/21/2016   TRIG 108 07/20/2020   CHOLHDL 4 10/12/2017   5. Vitamin D deficiency Nabiha's Vitamin D level was 60.4 on 04/05/2020. She is currently taking prescription vitamin D 50,000 IU each week. She denies nausea, vomiting or muscle weakness.  Assessment/Plan:   1. Other fatigue and SOB IC performed today.  RMR previously 1607, and today is 2319.  2. Type 2 diabetes mellitus with obesity ([place]) Good blood sugar control is important to decrease the likelihood of diabetic complications such as nephropathy, neuropathy, limb loss, blindness, coronary artery disease, and death. Intensive lifestyle modification including diet, exercise and weight loss are the first line of treatment for diabetes.  Check A1c and insulin today.  - Hemoglobin A1c - Insulin, random  3. Hypertension associated with type 2 diabetes mellitus ([place]) Coreena is working on healthy weight loss and exercise to improve blood pressure control. We will watch for signs of hypotension as she continues her lifestyle modifications.  Check CMP today.  - Comprehensive metabolic panel  4. Mixed hyperlipidemia Cardiovascular risk and specific lipid/LDL goals reviewed.  We discussed several lifestyle modifications today and Adeola will continue to work on diet, exercise and weight loss efforts. Orders and follow up as documented in patient record.  Check lipid panel today.  Counseling Intensive lifestyle modifications are the first line treatment for this issue. Marland Kitchen  Dietary changes: Increase soluble fiber. Decrease simple carbohydrates. . Exercise changes: Moderate to vigorous-intensity aerobic activity 150 minutes per week if tolerated. . Lipid-lowering medications: see documented in medical record.  - Lipid Panel With LDL/HDL Ratio  5. Vitamin D deficiency Low Vitamin D level contributes to fatigue and are  associated with obesity, breast, and colon cancer. She agrees to continue to take prescription Vitamin D @50 ,000 IU every week and will check her vitamin D level today.  - VITAMIN D 25 Hydroxy (Vit-D Deficiency, Fractures)  6. Class 3 severe obesity with serious comorbidity and body mass index (BMI) of 40.0 to 44.9 in adult, unspecified obesity type (HCC)  Tiesha is currently in the action stage of change. As such, her goal is to continue with weight loss efforts. She has agreed to the Category 3 Plan.   She will work on meal planning, intentional eating, and increasing her water intake.  Exercise goals: As is.  Behavioral modification strategies: increasing lean protein intake, decreasing simple carbohydrates, increasing vegetables, increasing water intake, decreasing eating out, no skipping meals, meal planning and cooking strategies, keeping healthy foods in the home and planning for success.  Oswin has agreed to follow-up with our clinic in 2 weeks. She was informed of the importance of frequent follow-up visits to maximize her success with intensive lifestyle modifications for her multiple health conditions.   Objective:   Blood pressure 117/81, pulse 85, temperature 98.6 F (37 C), height 5\' 6"  (1.676 m), weight 264 lb (119.7 kg), SpO2 97 %. Body mass index is 42.61 kg/m.  General: Cooperative, alert, well developed, in no acute distress. HEENT: Conjunctivae and lids unremarkable. Cardiovascular: Regular rhythm.  Lungs: Normal work of breathing. Neurologic: No focal deficits.   Lab Results  Component Value Date   CREATININE 0.91 07/20/2020   BUN 14 07/20/2020   NA 142 07/20/2020   K 4.4 07/20/2020   CL 100 07/20/2020   CO2 25 07/20/2020   Lab Results  Component Value Date   ALT 17 07/20/2020   AST 20 07/20/2020   ALKPHOS 79 07/20/2020   BILITOT 0.4 07/20/2020   Lab Results  Component Value Date   HGBA1C 6.1 (H) 07/20/2020   HGBA1C 6.2 (H) 04/05/2020   HGBA1C  6.0 (H) 11/13/2019   HGBA1C 6.4 (H) 07/21/2019   HGBA1C 6.6 (H) 10/12/2017   Lab Results  Component Value Date   INSULIN 17.8 07/20/2020   INSULIN 19.2 04/05/2020   INSULIN 12.9 11/13/2019   INSULIN 35.9 (H) 07/21/2019   Lab Results  Component Value Date   TSH 0.807 07/21/2019   Lab Results  Component Value Date   CHOL 151 07/20/2020   HDL 50 07/20/2020   LDLCALC 81 07/20/2020   LDLDIRECT 110.0 08/21/2016   TRIG 108 07/20/2020   CHOLHDL 4 10/12/2017   Lab Results  Component Value Date   WBC 9.3 09/19/2017   HGB 12.1 09/19/2017   HCT 36.6 09/19/2017   MCV 86.5 09/19/2017   PLT 260.0 09/19/2017   Obesity Behavioral Intervention:   Approximately 15 minutes were spent on the discussion below.  ASK: We discussed the diagnosis of obesity with Illeana today and Haani agreed to give [name] permission to discuss obesity behavioral modification therapy today.  ASSESS: Jameah has the diagnosis of obesity and her BMI today is 42.7. Luetta is in the action stage of change.   ADVISE: Loreda was educated on the multiple health risks of obesity as well as the benefit of weight loss to improve  her health. She was advised of the need for long term treatment and the importance of lifestyle modifications to improve her current health and to decrease her risk of future health problems.  AGREE: Multiple dietary modification options and treatment options were discussed and Lianah agreed to follow the recommendations documented in the above note.  ARRANGE: Sunshyne was educated on the importance of frequent visits to treat obesity as outlined per CMS and USPSTF guidelines and agreed to schedule her next follow up appointment today.  Attestation Statements:   Reviewed by clinician on day of visit: allergies, medications, problem list, medical history, surgical history, family history, social history, and previous encounter notes.  I, Water quality scientist, CMA, am acting as Location manager for  [employer], DO  I have reviewed the above documentation for accuracy and completeness, and I agree with the above. [name], DO

## 2020-07-24 LAB — RHEUMATOID FACTOR: Rheumatoid fact SerPl-aCnc: <14 IU/mL (ref <14)

## 2020-07-24 LAB — ANA: Anti Nuclear Antibody (ANA): NEGATIVE

## 2020-07-26 DIAGNOSIS — E119 Type 2 diabetes mellitus without complications: Secondary | ICD-10-CM | POA: Diagnosis not present

## 2020-08-02 ENCOUNTER — Ambulatory Visit (INDEPENDENT_AMBULATORY_CARE_PROVIDER_SITE_OTHER): Payer: Medicare Other | Admitting: Bariatrics

## 2020-08-03 ENCOUNTER — Ambulatory Visit (INDEPENDENT_AMBULATORY_CARE_PROVIDER_SITE_OTHER): Payer: Medicare Other | Admitting: Bariatrics

## 2020-08-03 ENCOUNTER — Encounter (INDEPENDENT_AMBULATORY_CARE_PROVIDER_SITE_OTHER): Payer: Self-pay | Admitting: Bariatrics

## 2020-08-03 ENCOUNTER — Other Ambulatory Visit: Payer: Self-pay

## 2020-08-03 VITALS — BP 118/78 | HR 89 | Temp 98.5°F | Ht 66.0 in | Wt 263.0 lb

## 2020-08-03 DIAGNOSIS — I10 Essential (primary) hypertension: Secondary | ICD-10-CM

## 2020-08-03 DIAGNOSIS — E782 Mixed hyperlipidemia: Secondary | ICD-10-CM | POA: Diagnosis not present

## 2020-08-03 DIAGNOSIS — F5089 Other specified eating disorder: Secondary | ICD-10-CM

## 2020-08-03 DIAGNOSIS — E1169 Type 2 diabetes mellitus with other specified complication: Secondary | ICD-10-CM

## 2020-08-03 DIAGNOSIS — Z6841 Body Mass Index (BMI) 40.0 and over, adult: Secondary | ICD-10-CM | POA: Diagnosis not present

## 2020-08-03 MED ORDER — BUPROPION HCL ER (SR) 150 MG PO TB12: 150.0000 mg | ORAL_TABLET | Freq: Every day | ORAL | 0 refills | Status: DC

## 2020-08-04 NOTE — Progress Notes (Unsigned)
Chief Complaint:   OBESITY [name] is here to discuss her progress with her obesity treatment plan along with follow-up of her obesity related diagnoses. [name] is on the Category 3 Plan Holly states she is following her eating plan approximately 80% of the time. [name] states she is walking 30 minutes 2 times per week.  Today's visit was #: 20 Starting weight: 301 lbs Starting date: 07/21/2019 Today's weight: 263 lbs Today's date: 08/03/2020 Total lbs lost to date: 38 lbs Total lbs lost since last in-office visit: 1 lb  Interim History: [name] is down an additional 1 lb. She continues to use Victoza Holly is doing better. She is struggling with sweets in the afternoon.  Subjective:   1. Mixed hyperlipidemia [name] is taking Lipitor.  Lab Results  Component Value Date   ALT 17 07/20/2020   AST 20 07/20/2020   ALKPHOS 79 07/20/2020   BILITOT 0.4 07/20/2020   Lab Results  Component Value Date   CHOL 151 07/20/2020   HDL 50 07/20/2020   LDLCALC 81 07/20/2020   LDLDIRECT 110.0 08/21/2016   TRIG 108 07/20/2020   CHOLHDL 4 10/12/2017    2. Essential hypertension [name]'s BP is controlled.  BP Readings from Last 3 Encounters:  08/03/20 118/78  07/20/20 117/81  07/07/20 115/78    3. Other disorder of eating [name] has no contraindications to Wellbutrin.  4. Type 2 diabetes mellitus with other specified complication, without long-term current use of insulin (HCC) [name] is controlled. Her A1c 6.1 with an insulin level of 17.8.  Lab Results  Component Value Date   HGBA1C 6.1 (H) 07/20/2020   HGBA1C 6.2 (H) 04/05/2020   HGBA1C 6.0 (H) 11/13/2019   Lab Results  Component Value Date   LDLCALC 81 07/20/2020   CREATININE 0.91 07/20/2020   Lab Results  Component Value Date   INSULIN 17.8 07/20/2020   INSULIN 19.2 04/05/2020   INSULIN 12.9 11/13/2019   INSULIN 35.9 (H) 07/21/2019     Assessment/Plan:   1. Mixed hyperlipidemia Cardiovascular risk Holly  specific lipid/LDL goals reviewed.  We discussed several lifestyle modifications today [name] will continue to work on [name], Holly [name]. Orders Holly follow up as documented in patient record. Continue Lipitor. No trans fats. Increase PUFAs Holly MUFAs.  Counseling Intensive lifestyle modifications are the first line treatment for this issue. . Dietary changes: Increase soluble fiber. Decrease simple carbohydrates. . Holly changes: Moderate to vigorous-intensity aerobic activity 150 minutes per week if tolerated. . Lipid-lowering medications: see documented in medical record.  2. Essential hypertension [name] is working on healthy weight [name] Holly Holly to improve blood pressure control. We will watch for signs of hypotension as she continues her lifestyle modifications. Continue current treatment plan.  3. Other disorder of eating Behavior modification techniques were discussed today to help [name] with her emotional/non-hunger eating behaviors.  Orders Holly follow up as documented in patient record.   - buPROPion (WELLBUTRIN SR) 150 MG 12 hr tablet; Take 1 tablet (150 mg total) by mouth daily.  Dispense: 30 tablet; Refill: 0  4. Type 2 diabetes mellitus with other specified complication, without long-term current use of insulin (HCC) [name], [name], [name], [name], [name], [name], Holly Holly weight [name] are the first line of treatment for diabetes.   5. Class 3 severe obesity due to excess  calories with serious comorbidity Holly body [name] index (BMI) of 50.0 to 59.9 in adult [name] is currently in the action stage of change. As such, her goal is to continue with weight [name] [name]. She has [name] to the Category 3 Plan.   Reviewed 07/20/2020 labs with pt  Holly goals:  As is  Behavioral modification strategies: increasing lean protein intake, decreasing simple carbohydrates, increasing vegetables, increasing water intake, decreasing eating out, no skipping meals, meal planning Holly cooking strategies, keeping healthy foods in the home Holly planning for success.  [name] to follow-up with our clinic in 2-3 weeks. She was informed of the importance of frequent follow-up visits to maximize her success with intensive lifestyle modifications for her multiple health conditions.   Objective:   Blood pressure 118/78, pulse 89, temperature 98.5 F (36.9 C), height 5\' 6"  (1.676 m), weight 263 lb (119.3 kg), SpO2 99 %. Body [name] index is 42.45 kg/m.  General: Cooperative, alert, well developed, in no acute distress. HEENT: Conjunctivae Holly lids unremarkable. Cardiovascular: Regular rhythm.  Lungs: Normal work of breathing. Neurologic: No focal deficits.   Lab Results  Component Value Date   CREATININE 0.91 07/20/2020   BUN 14 07/20/2020   NA 142 07/20/2020   K 4.4 07/20/2020   CL 100 07/20/2020   CO2 25 07/20/2020   Lab Results  Component Value Date   ALT 17 07/20/2020   AST 20 07/20/2020   ALKPHOS 79 07/20/2020   BILITOT 0.4 07/20/2020   Lab Results  Component Value Date   HGBA1C 6.1 (H) 07/20/2020   HGBA1C 6.2 (H) 04/05/2020   HGBA1C 6.0 (H) 11/13/2019   HGBA1C 6.4 (H) 07/21/2019   HGBA1C 6.6 (H) 10/12/2017   Lab Results  Component Value Date   INSULIN 17.8 07/20/2020   INSULIN 19.2 04/05/2020   INSULIN 12.9 11/13/2019   INSULIN 35.9 (H) 07/21/2019   Lab Results  Component Value Date   TSH 0.807 07/21/2019   Lab Results  Component Value Date   CHOL 151 07/20/2020   HDL 50 07/20/2020   LDLCALC 81 07/20/2020   LDLDIRECT 110.0 08/21/2016   TRIG 108 07/20/2020   CHOLHDL 4 10/12/2017   Lab Results  Component Value Date   WBC 9.3 09/19/2017   HGB 12.1 09/19/2017   HCT 36.6 09/19/2017   MCV 86.5 09/19/2017   PLT 260.0  09/19/2017   No results found for: IRON, TIBC, FERRITIN  Obesity Behavioral Intervention:   Approximately 15 minutes were spent on the discussion below.  ASK: We discussed the diagnosis of obesity with [name] today Holly [name] [name] to give [name] permission to discuss obesity behavioral modification therapy today.  ASSESS: [name] has the diagnosis of obesity Holly her BMI today is 42.5. Humna is in the action stage of change.   ADVISE: Cilicia was educated on the multiple health risks of obesity as well as the benefit of weight [name] to improve her health. She was advised of the need for long term treatment Holly the importance of lifestyle modifications to improve her current health Holly to decrease her risk of future health problems.  AGREE: Multiple dietary modification options Holly treatment options were discussed Holly Jamera [name] to follow the recommendations documented in the above note.  ARRANGE: Andreika was educated on the importance of frequent visits to treat obesity as outlined per CMS Holly USPSTF guidelines [name] to schedule her next follow up appointment today.  Attestation Statements:   Reviewed by clinician on day of  visit: allergies, medications, problem list, medical history, surgical history, family history, social history, Holly previous encounter notes.  Coral Ceo, am acting as Location manager for [employer], DO.  I have reviewed the above documentation for accuracy Holly completeness, Holly I agree with the above. [name], DO

## 2020-08-05 ENCOUNTER — Encounter (INDEPENDENT_AMBULATORY_CARE_PROVIDER_SITE_OTHER): Payer: Self-pay | Admitting: Bariatrics

## 2020-08-18 ENCOUNTER — Other Ambulatory Visit: Payer: Self-pay

## 2020-08-18 ENCOUNTER — Encounter: Payer: Self-pay | Admitting: Family Medicine

## 2020-08-18 ENCOUNTER — Telehealth (INDEPENDENT_AMBULATORY_CARE_PROVIDER_SITE_OTHER): Payer: Medicare Other | Admitting: Family Medicine

## 2020-08-18 DIAGNOSIS — J0110 Acute frontal sinusitis, unspecified: Secondary | ICD-10-CM

## 2020-08-18 MED ORDER — DOXYCYCLINE HYCLATE 100 MG PO TABS: 100.0000 mg | ORAL_TABLET | Freq: Two times a day (BID) | ORAL | 0 refills | Status: AC

## 2020-08-18 NOTE — Progress Notes (Signed)
Chief Complaint  Patient presents with  . Cough    congestion    [name] here for URI complaints. Due to COVID-19 pandemic, we are interacting via telephone. I verified patient's ID using 2 identifiers. Patient agreed to proceed with visit via this method. Patient is at home, I am at office. Patient and I are present for visit.   Duration: 3 weeks  Associated symptoms: sinus congestion, sinus pain, rhinorrhea, itchy watery eyes, sore throat and coughing Denies: ear pain, ear drainage, wheezing, shortness of breath, myalgia and fevers Treatment to date: INCS, Allegra, equate sinus med Sick contacts: No  Past Medical History:  Diagnosis Date  . Allergic rhinitis   . Allergy   . Anemia 07/17/2016  . Anxiety   . Asthma    seasonal  . Blood transfusion without reported diagnosis   . Breast cancer ([place])    right  . Bronchitis   . Colon polyps   . CPAP (continuous positive airway pressure) dependence   . Depression   . Diabetes mellitus    borderline but takes metformin  . Edema   . Family history of breast cancer in first degree relative   . Fibromyalgia   . GERD (gastroesophageal reflux disease)   . Hyperglycemia 03/05/2017  . Hyperlipidemia 08/21/2016   no meds  . Hyperparathyroidism   . Hypertension    controlled by medications  . Joint pain   . Neuromuscular disorder (HCC)    Fibromyalgia  . Obesity   . Osteoarthritis    RA  . PONV (postoperative nausea and vomiting)    occasional  . Pre-diabetes   . Sleep apnea    wears cpap  . Viral meningitis   . Vitamin D deficiency    Objective No conversational dyspnea Age appropriate judgment and insight Nml affect and mood  Acute frontal sinusitis, recurrence not specified - Plan: doxycycline (VIBRA-TABS) 100 MG tablet  7 d doxy bid given duration of sinus pain.  Continue to push fluids, practice good hand hygiene, cover mouth when coughing. F/u prn. If starting to experience fevers, shaking, or  shortness of breath, seek immediate care. Total time: 7 min Pt voiced understanding and agreement to the plan.  Clinch, DO 08/18/20 2:23 PM

## 2020-08-19 ENCOUNTER — Other Ambulatory Visit: Payer: Self-pay

## 2020-08-19 ENCOUNTER — Encounter (INDEPENDENT_AMBULATORY_CARE_PROVIDER_SITE_OTHER): Payer: Self-pay | Admitting: Bariatrics

## 2020-08-19 ENCOUNTER — Ambulatory Visit (INDEPENDENT_AMBULATORY_CARE_PROVIDER_SITE_OTHER): Payer: Medicare Other | Admitting: Bariatrics

## 2020-08-19 VITALS — BP 120/81 | HR 84 | Temp 98.3°F | Ht 66.0 in | Wt 260.0 lb

## 2020-08-19 DIAGNOSIS — E782 Mixed hyperlipidemia: Secondary | ICD-10-CM

## 2020-08-19 DIAGNOSIS — K5909 Other constipation: Secondary | ICD-10-CM | POA: Diagnosis not present

## 2020-08-19 DIAGNOSIS — Z6841 Body Mass Index (BMI) 40.0 and over, adult: Secondary | ICD-10-CM | POA: Diagnosis not present

## 2020-08-19 DIAGNOSIS — I10 Essential (primary) hypertension: Secondary | ICD-10-CM | POA: Diagnosis not present

## 2020-08-24 ENCOUNTER — Encounter (INDEPENDENT_AMBULATORY_CARE_PROVIDER_SITE_OTHER): Payer: Self-pay | Admitting: Bariatrics

## 2020-08-24 NOTE — Progress Notes (Signed)
Chief Complaint:   OBESITY [name] is here to discuss her progress with her obesity treatment plan along with follow-up of her obesity related diagnoses. [name] is on the Category 3 Plan and states she is following her eating plan approximately 80% of the time. [name] states she is walking for 30 minutes 2 times per week.  Today's visit was #: 21 Starting weight: 301 lbs Starting date: 07/21/2019 Today's weight: 260 lbs Today's date: 08/19/2020 Total lbs lost to date: 41 Total lbs lost since last in-office visit: 3  Interim History: [name] is down another 3 lbs and she is doing well overall. She is taking Victoza and it helps with her appetite.  Subjective:   1. Essential hypertension [name] is taking Micardis and Microzide.  2. Mixed hyperlipidemia [name] is taking Lipitor.  3. Other constipation [name] is using metamucil.  Assessment/Plan:   1. Essential hypertension [name] will continue her medications, and will continue working on healthy weight loss and exercise to improve blood pressure control. We will watch for signs of hypotension as she continues her lifestyle modifications.  2. Mixed hyperlipidemia Cardiovascular risk and specific lipid/LDL goals reviewed. We discussed several lifestyle modifications today. [name] will continue her medications, and will continue to work on diet, exercise and weight loss efforts. She is to eat a variety of healthy fats, decrease carbohydrate, and no trans fats. Orders and follow up as documented in patient record.   Counseling Intensive lifestyle modifications are the first line treatment for this issue. . Dietary changes: Increase soluble fiber. Decrease simple carbohydrates. . Exercise changes: Moderate to vigorous-intensity aerobic activity 150 minutes per week if tolerated. . Lipid-lowering medications: see documented in medical record.  3. Other constipation [name] will continue metamucil, and she was informed  that a decrease in bowel movement frequency is normal while losing weight, but stools should not be hard or painful. Orders and follow up as documented in patient record.   Counseling Getting to Good Bowel Health: Your goal is to have one soft bowel movement each day. Drink at least 8 glasses of water each day. Eat plenty of fiber (goal is over 25 grams each day). It is best to get most of your fiber from dietary sources which includes leafy green vegetables, fresh fruit, and whole grains. You may need to add fiber with the help of OTC fiber supplements. These include Metamucil, Citrucel, and Flaxseed. If you are still having trouble, try adding Miralax or Magnesium Citrate. If all of these changes do not work, Cabin crew.  4. obesity, current BMI 72 [name] is currently in the action stage of change. As such, her goal is to continue with weight loss efforts. She has agreed to the Category 3 Plan.   [name] will continue Victoza, and will stay adherent to the plan.  Exercise goals: As is, with less knee pain.  Behavioral modification strategies: increasing lean protein intake, decreasing simple carbohydrates, increasing vegetables, increasing water intake, decreasing eating out, no skipping meals, meal planning and cooking strategies, keeping healthy foods in the home and planning for success.  [name] has agreed to follow-up with our clinic in 2 to 3 weeks. She was informed of the importance of frequent follow-up visits to maximize her success with intensive lifestyle modifications for her multiple health conditions.   Objective:   Blood pressure 120/81, pulse 84, temperature 98.3 F (36.8 C), height 5\' 6"  (1.676 m), weight 260 lb (117.9 kg), SpO2 97 %. Body mass index is 41.97 kg/m.  General: Cooperative, alert, well developed, in no acute distress. HEENT: Conjunctivae and lids unremarkable. Cardiovascular: Regular rhythm.  Lungs: Normal work of breathing. Neurologic: No  focal deficits.   Lab Results  Component Value Date   CREATININE 0.91 07/20/2020   BUN 14 07/20/2020   NA 142 07/20/2020   K 4.4 07/20/2020   CL 100 07/20/2020   CO2 25 07/20/2020   Lab Results  Component Value Date   ALT 17 07/20/2020   AST 20 07/20/2020   ALKPHOS 79 07/20/2020   BILITOT 0.4 07/20/2020   Lab Results  Component Value Date   HGBA1C 6.1 (H) 07/20/2020   HGBA1C 6.2 (H) 04/05/2020   HGBA1C 6.0 (H) 11/13/2019   HGBA1C 6.4 (H) 07/21/2019   HGBA1C 6.6 (H) 10/12/2017   Lab Results  Component Value Date   INSULIN 17.8 07/20/2020   INSULIN 19.2 04/05/2020   INSULIN 12.9 11/13/2019   INSULIN 35.9 (H) 07/21/2019   Lab Results  Component Value Date   TSH 0.807 07/21/2019   Lab Results  Component Value Date   CHOL 151 07/20/2020   HDL 50 07/20/2020   LDLCALC 81 07/20/2020   LDLDIRECT 110.0 08/21/2016   TRIG 108 07/20/2020   CHOLHDL 4 10/12/2017   Lab Results  Component Value Date   WBC 9.3 09/19/2017   HGB 12.1 09/19/2017   HCT 36.6 09/19/2017   MCV 86.5 09/19/2017   PLT 260.0 09/19/2017   No results found for: IRON, TIBC, FERRITIN  Obesity Behavioral Intervention:   Approximately 15 minutes were spent on the discussion below.  ASK: We discussed the diagnosis of obesity with [name] today and [name] agreed to give [name] permission to discuss obesity behavioral modification therapy today.  ASSESS: [name] has the diagnosis of obesity and her BMI today is 41.99. [name] is in the action stage of change.   ADVISE: [name] was educated on the multiple health risks of obesity as well as the benefit of weight loss to improve her health. She was advised of the need for long term treatment and the importance of lifestyle modifications to improve her current health and to decrease her risk of future health problems.  AGREE: Multiple dietary modification options and treatment options were discussed and [name] agreed to follow the recommendations  documented in the above note.  ARRANGE: [name] was educated on the importance of frequent visits to treat obesity as outlined per CMS and USPSTF guidelines and agreed to schedule her next follow up appointment today.  Attestation Statements:   Reviewed by clinician on day of visit: allergies, medications, problem list, medical history, surgical history, family history, social history, and previous encounter notes.   Wilhemena Durie, am acting as Location manager for [employer], DO.  I have reviewed the above documentation for accuracy and completeness, and I agree with the above. [name], DO

## 2020-09-01 ENCOUNTER — Inpatient Hospital Stay: Admission: RE | Admit: 2020-09-01 | Payer: Medicare Other | Source: Ambulatory Visit

## 2020-09-02 LAB — HM DEXA SCAN: HM Dexa Scan: NORMAL

## 2020-09-06 ENCOUNTER — Encounter (INDEPENDENT_AMBULATORY_CARE_PROVIDER_SITE_OTHER): Payer: Self-pay | Admitting: Bariatrics

## 2020-09-06 NOTE — Telephone Encounter (Signed)
DR Owens Shark

## 2020-09-07 ENCOUNTER — Other Ambulatory Visit (INDEPENDENT_AMBULATORY_CARE_PROVIDER_SITE_OTHER): Payer: Self-pay | Admitting: Bariatrics

## 2020-09-07 DIAGNOSIS — F5089 Other specified eating disorder: Secondary | ICD-10-CM

## 2020-09-07 MED ORDER — BUPROPION HCL ER (SR) 150 MG PO TB12: 150.0000 mg | ORAL_TABLET | Freq: Every day | ORAL | 0 refills | Status: DC

## 2020-09-07 NOTE — Telephone Encounter (Signed)
Refill request

## 2020-09-13 ENCOUNTER — Other Ambulatory Visit: Payer: Self-pay

## 2020-09-13 ENCOUNTER — Encounter (INDEPENDENT_AMBULATORY_CARE_PROVIDER_SITE_OTHER): Payer: Self-pay | Admitting: Bariatrics

## 2020-09-13 ENCOUNTER — Ambulatory Visit (INDEPENDENT_AMBULATORY_CARE_PROVIDER_SITE_OTHER): Payer: Medicare Other | Admitting: Bariatrics

## 2020-09-13 VITALS — BP 108/76 | HR 83 | Temp 98.1°F | Ht 66.0 in | Wt 263.0 lb

## 2020-09-13 DIAGNOSIS — F5089 Other specified eating disorder: Secondary | ICD-10-CM

## 2020-09-13 DIAGNOSIS — K5909 Other constipation: Secondary | ICD-10-CM

## 2020-09-13 DIAGNOSIS — Z6841 Body Mass Index (BMI) 40.0 and over, adult: Secondary | ICD-10-CM

## 2020-09-13 DIAGNOSIS — E1169 Type 2 diabetes mellitus with other specified complication: Secondary | ICD-10-CM

## 2020-09-13 MED ORDER — INSULIN PEN NEEDLE 32G X 6 MM MISC: 0 refills | Status: DC

## 2020-09-13 MED ORDER — VICTOZA 18 MG/3ML ~~LOC~~ SOPN: PEN_INJECTOR | SUBCUTANEOUS | 0 refills | Status: DC

## 2020-09-14 ENCOUNTER — Encounter: Payer: Self-pay | Admitting: Family Medicine

## 2020-09-14 ENCOUNTER — Encounter (INDEPENDENT_AMBULATORY_CARE_PROVIDER_SITE_OTHER): Payer: Self-pay | Admitting: Bariatrics

## 2020-09-14 MED ORDER — MELOXICAM 15 MG PO TABS: 15.0000 mg | ORAL_TABLET | Freq: Every day | ORAL | 0 refills | Status: DC | PRN

## 2020-09-14 NOTE — Progress Notes (Signed)
Chief Complaint:   OBESITY [name] is here to discuss her progress with her obesity treatment plan along with follow-up of her obesity related diagnoses. [name] is on the Category 4 Plan and states she is following her eating plan approximately 75% of the time. [name] states she is walking for 30-45 minutes 2 times per week.  Today's visit was #: 22 Starting weight: 301 lbs Starting date: 07/21/2019 Today's weight: 263 lbs Today's date: 09/14/2019 Total lbs lost to date: 38 lbs Total lbs lost since last in-office visit: 0  Interim History: [name] is up 3 pounds, but has done well overall.  Subjective:   1. Type 2 diabetes mellitus with other specified complication, without long-term current use of insulin (HCC) She is taking Victoza and says it is helping with her hunger.  Lab Results  Component Value Date   HGBA1C 6.1 (H) 07/20/2020   HGBA1C 6.2 (H) 04/05/2020   HGBA1C 6.0 (H) 11/13/2019   Lab Results  Component Value Date   LDLCALC 81 07/20/2020   CREATININE 0.91 07/20/2020   Lab Results  Component Value Date   INSULIN 17.8 07/20/2020   INSULIN 19.2 04/05/2020   INSULIN 12.9 11/13/2019   INSULIN 35.9 (H) 07/21/2019   2. Other constipation She is taking Benefiber and Metamucil daily.  3. Other disorder of eating [name] is taking Wellbutrin.  Assessment/Plan:   1. Type 2 diabetes mellitus with other specified complication, without long-term current use of insulin (HCC) Good blood sugar control is important to decrease the likelihood of diabetic complications such as nephropathy, neuropathy, limb loss, blindness, coronary artery disease, and death. Intensive lifestyle modification including diet, exercise and weight loss are the first line of treatment for diabetes.  Continue Victoza, as per below.  - Insulin Pen Needle 32G X 6 MM MISC; Use with Victoza daily  Dispense: 50 each; Refill: 0 - Refill liraglutide (VICTOZA) 18 MG/3ML SOPN; Start 0.6mg  SQ once a  day for 7 days, then increase to 1.2mg  once a day  Dispense: 6 mL; Refill: 0  2. Other constipation Continue Benefiber.  Try [name] or [name].  3. Other disorder of eating Continue Wellbutrin.   4. Obesity, current BMI 50  [name] is currently in the action stage of change. As such, her goal is to continue with weight loss efforts. She has agreed to the Category 4 Plan.   She will work on meal planning and will stay more adherent to the plan.  Exercise goals: As is.  Behavioral modification strategies: increasing lean protein intake, decreasing simple carbohydrates, increasing vegetables, increasing water intake, decreasing eating out, no skipping meals, meal planning and cooking strategies, keeping healthy foods in the home and planning for success.  [name] has agreed to follow-up with our clinic in 2-3 weeks. She was informed of the importance of frequent follow-up visits to maximize her success with intensive lifestyle modifications for her multiple health conditions.   Objective:   Blood pressure 108/76, pulse 83, temperature 98.1 F (36.7 C), height 5\' 6"  (1.676 m), weight 263 lb (119.3 kg), SpO2 99 %. Body mass index is 42.45 kg/m.  General: Cooperative, alert, well developed, in no acute distress. HEENT: Conjunctivae and lids unremarkable. Cardiovascular: Regular rhythm.  Lungs: Normal work of breathing. Neurologic: No focal deficits.   Lab Results  Component Value Date   CREATININE 0.91 07/20/2020   BUN 14 07/20/2020   NA 142 07/20/2020   K 4.4 07/20/2020   CL 100 07/20/2020   CO2  25 07/20/2020   Lab Results  Component Value Date   ALT 17 07/20/2020   AST 20 07/20/2020   ALKPHOS 79 07/20/2020   BILITOT 0.4 07/20/2020   Lab Results  Component Value Date   HGBA1C 6.1 (H) 07/20/2020   HGBA1C 6.2 (H) 04/05/2020   HGBA1C 6.0 (H) 11/13/2019   HGBA1C 6.4 (H) 07/21/2019   HGBA1C 6.6 (H) 10/12/2017   Lab Results  Component Value Date   INSULIN  17.8 07/20/2020   INSULIN 19.2 04/05/2020   INSULIN 12.9 11/13/2019   INSULIN 35.9 (H) 07/21/2019   Lab Results  Component Value Date   TSH 0.807 07/21/2019   Lab Results  Component Value Date   CHOL 151 07/20/2020   HDL 50 07/20/2020   LDLCALC 81 07/20/2020   LDLDIRECT 110.0 08/21/2016   TRIG 108 07/20/2020   CHOLHDL 4 10/12/2017   Lab Results  Component Value Date   WBC 9.3 09/19/2017   HGB 12.1 09/19/2017   HCT 36.6 09/19/2017   MCV 86.5 09/19/2017   PLT 260.0 09/19/2017   Obesity Behavioral Intervention:   Approximately 15 minutes were spent on the discussion below.  ASK: We discussed the diagnosis of obesity with [name] today and [name] agreed to give [name] permission to discuss obesity behavioral modification therapy today.  ASSESS: [name] has the diagnosis of obesity and her BMI today is 42.5. [name] is in the action stage of change.   ADVISE: [name] was educated on the multiple health risks of obesity as well as the benefit of weight loss to improve her health. She was advised of the need for long term treatment and the importance of lifestyle modifications to improve her current health and to decrease her risk of future health problems.  AGREE: Multiple dietary modification options and treatment options were discussed and [name] agreed to follow the recommendations documented in the above note.  ARRANGE: [name] was educated on the importance of frequent visits to treat obesity as outlined per CMS and USPSTF guidelines and agreed to schedule her next follow up appointment today.  Attestation Statements:   Reviewed by clinician on day of visit: allergies, medications, problem list, medical history, surgical history, family history, social history, and previous encounter notes.  I, Water quality scientist, CMA, am acting as Location manager for [employer], DO  I have reviewed the above documentation for accuracy and completeness, and I agree with the above. Holly Lesch, DO

## 2020-09-16 ENCOUNTER — Other Ambulatory Visit: Payer: Self-pay

## 2020-09-16 ENCOUNTER — Other Ambulatory Visit: Payer: Self-pay | Admitting: Obstetrics and Gynecology

## 2020-09-16 ENCOUNTER — Ambulatory Visit
Admission: RE | Admit: 2020-09-16 | Discharge: 2020-09-16 | Disposition: A | Discharge status: Home / Self Care | Payer: Medicare Other | Source: Ambulatory Visit | Attending: Obstetrics and Gynecology | Admitting: Obstetrics and Gynecology

## 2020-09-16 DIAGNOSIS — Z1231 Encounter for screening mammogram for malignant neoplasm of breast: Secondary | ICD-10-CM

## 2020-09-16 DIAGNOSIS — N6320 Unspecified lump in the left breast, unspecified quadrant: Secondary | ICD-10-CM

## 2020-09-17 ENCOUNTER — Ambulatory Visit
Admission: RE | Admit: 2020-09-17 | Discharge: 2020-09-17 | Disposition: A | Discharge status: Home / Self Care | Payer: Medicare Other | Source: Ambulatory Visit | Attending: Obstetrics and Gynecology | Admitting: Obstetrics and Gynecology

## 2020-09-17 ENCOUNTER — Other Ambulatory Visit: Payer: Self-pay

## 2020-09-17 DIAGNOSIS — N6320 Unspecified lump in the left breast, unspecified quadrant: Secondary | ICD-10-CM

## 2020-09-17 DIAGNOSIS — Z853 Personal history of malignant neoplasm of breast: Secondary | ICD-10-CM | POA: Diagnosis not present

## 2020-09-17 DIAGNOSIS — N6489 Other specified disorders of breast: Secondary | ICD-10-CM | POA: Diagnosis not present

## 2020-09-17 DIAGNOSIS — R928 Other abnormal and inconclusive findings on diagnostic imaging of breast: Secondary | ICD-10-CM | POA: Diagnosis not present

## 2020-09-17 LAB — HM MAMMOGRAPHY

## 2020-10-01 ENCOUNTER — Other Ambulatory Visit (INDEPENDENT_AMBULATORY_CARE_PROVIDER_SITE_OTHER): Payer: Self-pay | Admitting: Bariatrics

## 2020-10-01 DIAGNOSIS — E1169 Type 2 diabetes mellitus with other specified complication: Secondary | ICD-10-CM

## 2020-10-04 ENCOUNTER — Ambulatory Visit (INDEPENDENT_AMBULATORY_CARE_PROVIDER_SITE_OTHER): Payer: Medicare Other | Admitting: Bariatrics

## 2020-10-04 ENCOUNTER — Other Ambulatory Visit: Payer: Self-pay

## 2020-10-04 ENCOUNTER — Encounter (INDEPENDENT_AMBULATORY_CARE_PROVIDER_SITE_OTHER): Payer: Self-pay | Admitting: Bariatrics

## 2020-10-04 VITALS — BP 119/78 | HR 76 | Temp 98.3°F | Ht 66.0 in | Wt 262.0 lb

## 2020-10-04 DIAGNOSIS — M797 Fibromyalgia: Secondary | ICD-10-CM | POA: Diagnosis not present

## 2020-10-04 DIAGNOSIS — F5089 Other specified eating disorder: Secondary | ICD-10-CM

## 2020-10-04 DIAGNOSIS — E1140 Type 2 diabetes mellitus with diabetic neuropathy, unspecified: Secondary | ICD-10-CM

## 2020-10-04 DIAGNOSIS — Z6841 Body Mass Index (BMI) 40.0 and over, adult: Secondary | ICD-10-CM

## 2020-10-04 MED ORDER — BUPROPION HCL ER (SR) 200 MG PO TB12: 200.0000 mg | ORAL_TABLET | Freq: Every day | ORAL | 0 refills | Status: DC

## 2020-10-04 NOTE — Telephone Encounter (Signed)
Pt last seen by Dr. Brown.  

## 2020-10-05 ENCOUNTER — Encounter (INDEPENDENT_AMBULATORY_CARE_PROVIDER_SITE_OTHER): Payer: Self-pay | Admitting: Bariatrics

## 2020-10-05 NOTE — Progress Notes (Signed)
Chief Complaint:   OBESITY [name] is here to discuss her progress with her obesity treatment plan along with follow-up of her obesity related diagnoses. [name] is on the Category 4 Plan and states she is following her eating plan approximately 75% of the time. [name] states she is doing water aerobics for 60 minutes 1 time per week.  Today's visit was #: 23 Starting weight: 301 lbs Starting date: 07/21/2019 Today's weight: 262 lbs Today's date: 10/04/2020 Total lbs lost to date: 39 Total lbs lost since last in-office visit: 1  Interim History: [name] is down 1 lb since her last visit. She states that's she is hungry. She is still taking the Victoza, but until it is time to move up.  Subjective:   1. Fibromyalgia [name] is doing water aerobics.  2. Type 2 diabetes mellitus with diabetic neuropathy, without long-term current use of insulin ([place]) [name] is taking Victoza and metformin.  3. Other disorder of eating [name] is craving chocolate, and she is taking Wellbutrin.  Assessment/Plan:   1. Fibromyalgia Intensive lifestyle modifications are the first line treatment for this issue. We discussed several lifestyle modifications today. [name] will continue water aerobics, and increase exercise overall. She will continue to work on diet and weight loss efforts.We will continue to monitor. Orders and follow up as documented in patient record.   Counseling . Try [email or web address]-[email or web address]/, which is a series of self-care modules designed to teach patients several techniques to manage pain.   2. Type 2 diabetes mellitus with diabetic neuropathy, without long-term current use of insulin ([place]) [name] will continue her medications. Good blood sugar control is important to decrease the likelihood of diabetic complications such as nephropathy, neuropathy, limb loss, blindness, coronary artery disease, and death. Intensive lifestyle modification including diet, exercise and  weight loss are the first line of treatment for diabetes.   3. Other disorder of eating Behavior modification techniques were discussed today to help [name] deal with her emotional/non-hunger eating behaviors. [name] agreed to increase Wellbutrin SR to 200 mg 1 tablet PO daily with no refills. Orders and follow up as documented in patient record.   - buPROPion (WELLBUTRIN SR) 200 MG 12 hr tablet; Take 1 tablet (200 mg total) by mouth daily.  Dispense: 30 tablet; Refill: 0  4. Obesity, current BMI 65 [name] is currently in the action stage of change. As such, her goal is to continue with weight loss efforts. She has agreed to the Category 4 Plan.   Intentional eating was discussed.  Exercise goals: As is, and add walking >10,000 steps on other days.  Behavioral modification strategies: increasing lean protein intake, decreasing simple carbohydrates, increasing vegetables, increasing water intake, decreasing eating out, no skipping meals, meal planning and cooking strategies, keeping healthy foods in the home and planning for success.  [name] has agreed to follow-up with our clinic in 2 to 3 weeks. She was informed of the importance of frequent follow-up visits to maximize her success with intensive lifestyle modifications for her multiple health conditions.   Objective:   Pulse 76, temperature 98.3 F (36.8 C), height 5\' 6"  (1.676 m), weight 262 lb (118.8 kg), SpO2 99 %. Body mass index is 42.29 kg/m.  General: Cooperative, alert, well developed, in no acute distress. HEENT: Conjunctivae and lids unremarkable. Cardiovascular: Regular rhythm.  Lungs: Normal work of breathing. Neurologic: No focal deficits.   Lab Results  Component Value Date   CREATININE 0.91 07/20/2020   BUN 14 07/20/2020   NA 142  07/20/2020   K 4.4 07/20/2020   CL 100 07/20/2020   CO2 25 07/20/2020   Lab Results  Component Value Date   ALT 17 07/20/2020   AST 20 07/20/2020   ALKPHOS 79 07/20/2020    BILITOT 0.4 07/20/2020   Lab Results  Component Value Date   HGBA1C 6.1 (H) 07/20/2020   HGBA1C 6.2 (H) 04/05/2020   HGBA1C 6.0 (H) 11/13/2019   HGBA1C 6.4 (H) 07/21/2019   HGBA1C 6.6 (H) 10/12/2017   Lab Results  Component Value Date   INSULIN 17.8 07/20/2020   INSULIN 19.2 04/05/2020   INSULIN 12.9 11/13/2019   INSULIN 35.9 (H) 07/21/2019   Lab Results  Component Value Date   TSH 0.807 07/21/2019   Lab Results  Component Value Date   CHOL 151 07/20/2020   HDL 50 07/20/2020   LDLCALC 81 07/20/2020   LDLDIRECT 110.0 08/21/2016   TRIG 108 07/20/2020   CHOLHDL 4 10/12/2017   Lab Results  Component Value Date   WBC 9.3 09/19/2017   HGB 12.1 09/19/2017   HCT 36.6 09/19/2017   MCV 86.5 09/19/2017   PLT 260.0 09/19/2017   No results found for: IRON, TIBC, FERRITIN  Obesity Behavioral Intervention:   Approximately 15 minutes were spent on the discussion below.  ASK: We discussed the diagnosis of obesity with [place] today and Akirra agreed to give [name] permission to discuss obesity behavioral modification therapy today.  ASSESS: [name] has the diagnosis of obesity and her BMI today is 42.31. [name] is in the action stage of change.   ADVISE: [name] was educated on the multiple health risks of obesity as well as the benefit of weight loss to improve her health. She was advised of the need for long term treatment and the importance of lifestyle modifications to improve her current health and to decrease her risk of future health problems.  AGREE: Multiple dietary modification options and treatment options were discussed and [name] agreed to follow the recommendations documented in the above note.  ARRANGE: [name] was educated on the importance of frequent visits to treat obesity as outlined per CMS and USPSTF guidelines and agreed to schedule her next follow up appointment today.  Attestation Statements:   Reviewed by clinician on day of visit: allergies,  medications, problem list, medical history, surgical history, family history, social history, and previous encounter notes.   Wilhemena Durie, am acting as Location manager for [employer], DO.  I have reviewed the above documentation for accuracy and completeness, and I agree with the above. [name], DO

## 2020-10-12 ENCOUNTER — Other Ambulatory Visit (INDEPENDENT_AMBULATORY_CARE_PROVIDER_SITE_OTHER): Payer: Self-pay | Admitting: Bariatrics

## 2020-10-12 DIAGNOSIS — E1169 Type 2 diabetes mellitus with other specified complication: Secondary | ICD-10-CM

## 2020-10-17 ENCOUNTER — Other Ambulatory Visit: Payer: Self-pay | Admitting: Family Medicine

## 2020-10-28 ENCOUNTER — Ambulatory Visit (INDEPENDENT_AMBULATORY_CARE_PROVIDER_SITE_OTHER): Payer: Medicare Other | Admitting: Family Medicine

## 2020-10-28 ENCOUNTER — Encounter: Payer: Self-pay | Admitting: Family Medicine

## 2020-10-28 ENCOUNTER — Other Ambulatory Visit: Payer: Self-pay

## 2020-10-28 VITALS — BP 114/62 | HR 82 | Temp 98.8°F | Resp 18 | Ht 66.0 in | Wt 264.6 lb

## 2020-10-28 DIAGNOSIS — M109 Gout, unspecified: Secondary | ICD-10-CM | POA: Diagnosis not present

## 2020-10-28 DIAGNOSIS — G3184 Mild cognitive impairment, so stated: Secondary | ICD-10-CM

## 2020-10-28 DIAGNOSIS — I10 Essential (primary) hypertension: Secondary | ICD-10-CM

## 2020-10-28 DIAGNOSIS — Z0000 Encounter for general adult medical examination without abnormal findings: Secondary | ICD-10-CM | POA: Diagnosis not present

## 2020-10-28 DIAGNOSIS — E1140 Type 2 diabetes mellitus with diabetic neuropathy, unspecified: Secondary | ICD-10-CM

## 2020-10-28 DIAGNOSIS — Z1159 Encounter for screening for other viral diseases: Secondary | ICD-10-CM

## 2020-10-28 DIAGNOSIS — E782 Mixed hyperlipidemia: Secondary | ICD-10-CM | POA: Diagnosis not present

## 2020-10-28 DIAGNOSIS — M25561 Pain in right knee: Secondary | ICD-10-CM

## 2020-10-28 DIAGNOSIS — E559 Vitamin D deficiency, unspecified: Secondary | ICD-10-CM | POA: Diagnosis not present

## 2020-10-28 DIAGNOSIS — G6289 Other specified polyneuropathies: Secondary | ICD-10-CM

## 2020-10-28 DIAGNOSIS — R413 Other amnesia: Secondary | ICD-10-CM

## 2020-10-28 LAB — CBC WITH DIFFERENTIAL/PLATELET
Basophils Absolute: 0 10*3/uL (ref 0.0–0.1)
Basophils Relative: 0.7 % (ref 0.0–3.0)
Eosinophils Absolute: 0.1 10*3/uL (ref 0.0–0.7)
Eosinophils Relative: 2 % (ref 0.0–5.0)
HCT: 39 % (ref 36.0–46.0)
Hemoglobin: 12.8 g/dL (ref 12.0–15.0)
Lymphocytes Relative: 31.2 % (ref 12.0–46.0)
Lymphs Abs: 2.2 10*3/uL (ref 0.7–4.0)
MCHC: 32.8 g/dL (ref 30.0–36.0)
MCV: 87.3 fl (ref 78.0–100.0)
Monocytes Absolute: 0.7 10*3/uL (ref 0.1–1.0)
Monocytes Relative: 10.6 % (ref 3.0–12.0)
Neutro Abs: 3.8 10*3/uL (ref 1.4–7.7)
Neutrophils Relative %: 55.5 % (ref 43.0–77.0)
Platelets: 239 10*3/uL (ref 150.0–400.0)
RBC: 4.47 Mil/uL (ref 3.87–5.11)
RDW: 15.7 % — ABNORMAL HIGH (ref 11.5–15.5)
WBC: 6.9 10*3/uL (ref 4.0–10.5)

## 2020-10-28 LAB — TSH: TSH: 0.82 u[IU]/mL (ref 0.35–4.50)

## 2020-10-28 LAB — HEMOGLOBIN A1C: Hgb A1c MFr Bld: 6.3 % (ref 4.6–6.5)

## 2020-10-28 MED ORDER — FLUTICASONE PROPIONATE 50 MCG/ACT NA SUSP: 2.0000 | Freq: Every day | NASAL | 5 refills | Status: DC

## 2020-10-28 MED ORDER — TRAZODONE HCL 50 MG PO TABS: 25.0000 mg | ORAL_TABLET | Freq: Every evening | ORAL | 3 refills | Status: DC | PRN

## 2020-10-28 NOTE — Assessment & Plan Note (Signed)
Struggling with pain and swelling with a bakers cyst which is obviously symptomatic. Is causing swelling in right ankle. Raise feet above heart compression hose. Is trying to loose weight to get her BMI under 40 so Dr Novella Olive can do her TKR right now she is swimming 1 hour a week

## 2020-10-28 NOTE — Assessment & Plan Note (Addendum)
Referred to neuropsycology for evaluation. Patient still able to manage her ADLs, pay her bills etc.

## 2020-10-28 NOTE — Progress Notes (Signed)
Patient ID: [name], female    DOB: 08/01/1955  Age: 65 y.o. MRN: [id number]    Subjective:  Subjective  HPI [name] presents for comprehensive physical exam today and follow up on management of chronic concerns of weight loss and medication management. She states that she has no recent hospitalizations or ER visits to report. She expresses interest in changing gabapentin to another medication due to gabapentin causing her sleeping issues. She denies any chest pain, SOB, fever, abdominal pain, cough, chills, sore throat, dysuria, urinary incontinence, back pain, HA, or N/VD.   She reports that she was doing well on 150 mg of bupropion, but was still experiencing cravings. Then her dosage was increased to 200 mg, but then she states she experienced sleepiness. She states that she has stopped her 50,000 units of vitamin D and started on another vitamin D supplement. She states that currently she is maintaining her weight, but expresses the desire to lose more weight. She endorses going to the pool for 1 hour once a week and does her best to stay physically active. However, she states that she finds it challenging to go to the gym more often due to her right knee arthritis. She reports experiencing swelling in right LE due to her right knee arthritis. She states that she is planning on getting knee surgery to fix it by Dr. Rhona Raider at [facility], but states that she has to lower her BMI to less than 40 in order to get the surgery.  She reports that she still experiences limited ROM to her right big toe that is worse in the evening. She states trying topical creams like lidocaine, but states that they did not help. She reports having memory issues and expresses concern that she might be going through dementia due to FMHx of dementia in both her parents.   Review of Systems  Constitutional: Negative for chills, fatigue and fever.       (+) memory changes  HENT: Negative  for congestion, rhinorrhea, sinus pressure, sinus pain and sore throat.   Eyes: Negative for pain.  Respiratory: Negative for cough and shortness of breath.   Cardiovascular: Negative for chest pain, palpitations and leg swelling.  Gastrointestinal: Negative for abdominal pain, blood in stool, diarrhea, nausea and vomiting.  Genitourinary: Negative for decreased urine volume, flank pain, frequency, vaginal bleeding and vaginal discharge.  Musculoskeletal: Positive for arthralgias. Negative for back pain.       (+) right knee pain (+) right LE swelling  Neurological: Negative for headaches.    History Past Medical History:  Diagnosis Date  . Allergic rhinitis   . Allergy   . Anemia 07/17/2016  . Anxiety   . Asthma    seasonal  . Blood transfusion without reported diagnosis   . Breast cancer ([place])    right  . Bronchitis   . Colon polyps   . CPAP (continuous positive airway pressure) dependence   . Depression   . Diabetes mellitus    borderline but takes metformin  . Edema   . Family history of breast cancer in first degree relative   . Fibromyalgia   . GERD (gastroesophageal reflux disease)   . Hyperglycemia 03/05/2017  . Hyperlipidemia 08/21/2016   no meds  . Hyperparathyroidism   . Hypertension    controlled by medications  . Joint pain   . Neuromuscular disorder (HCC)    Fibromyalgia  . Obesity   . Osteoarthritis    RA  . PONV (  postoperative nausea and vomiting)    occasional  . Pre-diabetes   . Sleep apnea    wears cpap  . Viral meningitis   . Vitamin D deficiency     She has a past surgical history that includes Breast surgery; Knee arthroscopy; parathyroid resection; Total knee arthroplasty (06/18/08); Septoplasty (1980); Tonsillectomy; Parathyroidectomy; Hand surgery; Hand surgery; Tumor removal (1982); rectal abscess; Cesarean section (1988); Abdominal hysterectomy (1998); Adenoidectomy; left knee replacement (2010); Shoulder arthroscopy (Right, 11/09/2015);  Toe Surgery (Right); shoulder arthroscopy; Colonoscopy (08/14/2011); Polypectomy; Mastectomy (01/13/94); and Breast excisional biopsy (Left, 1993).   Her family history includes Aneurysm in her maternal grandmother; Arthritis in her maternal grandmother and mother; Arthritis/Rheumatoid in her sister; Asthma in her daughter; Breast cancer in her cousin, maternal aunt, and sister; Breast cancer (age of onset: 65) in her sister; Breast cancer (age of onset: 17) in her cousin; Cancer in her maternal aunt and maternal uncle; Colon cancer in her maternal aunt; Colon polyps in her maternal aunt and mother; Diabetes in her father and paternal grandfather; Emphysema in her father; Fibrocystic breast disease in her sister; Heart attack in her paternal grandfather; Heart disease in her paternal grandfather; Hypertension in her mother; Lung cancer in her father and maternal uncle; Multiple sclerosis in her sister; Other in her father, paternal uncle, and paternal uncle; Ovarian cancer in her maternal aunt; Prostate cancer in her cousin; Prostate cancer (age of onset: 63) in her maternal grandfather; Sleep apnea in her mother; Throat cancer in her cousin and cousin.She reports that she has never smoked. She has never used smokeless tobacco. She reports previous alcohol use. She reports that she does not use drugs.  Current Outpatient Medications on File Prior to Visit  Medication Sig Dispense Refill  . atorvastatin (LIPITOR) 10 MG tablet Take 1 tablet (10 mg total) by mouth 2 (two) times a week. 10 tablet 3  . buPROPion (WELLBUTRIN SR) 200 MG 12 hr tablet Take 1 tablet (200 mg total) by mouth daily. 30 tablet 0  . calcium carbonate (OSCAL) 1500 (600 Ca) MG TABS tablet Take 1,500 mg by mouth daily.    . Colchicine (MITIGARE) 0.6 MG CAPS Take 0.6 mg by mouth 2 (two) times daily as needed. 60 capsule 0  . cyclobenzaprine (FLEXERIL) 5 MG tablet Take 1 tablet (5 mg total) by mouth at bedtime as needed for muscle spasms. 15  tablet 0  . diclofenac Sodium (VOLTAREN) 1 % GEL diclofenac 1 % topical gel  APPLY A SMALL AMOUNT TO AFFECTED AREA FOUR TIMES A DAY AS NEEDED FOR PAIN    . famotidine (PEPCID) 40 MG tablet Take 1 tablet (40 mg total) by mouth daily. 30 tablet 5  . furosemide (LASIX) 40 MG tablet TAKE 1 TABLET BY MOUTH  TWICE DAILY 180 tablet 3  . glucosamine-chondroitin 500-400 MG tablet Take 2 tablets by mouth 2 (two) times daily.    . hydrochlorothiazide (MICROZIDE) 12.5 MG capsule TAKE 1 CAPSULE BY MOUTH  DAILY 90 capsule 3  . Insulin Pen Needle 32G X 6 MM MISC Use with Victoza daily 50 each 0  . liraglutide (VICTOZA) 18 MG/3ML SOPN Start 0.6mg  SQ once a day for 7 days, then increase to 1.2mg  once a day 6 mL 0  . meloxicam (MOBIC) 15 MG tablet TAKE 1 TABLET BY MOUTH  DAILY AS NEEDED FOR PAIN 90 tablet 0  . metFORMIN (GLUCOPHAGE) 500 MG tablet TAKE 1 TABLET BY MOUTH  TWICE DAILY WITH A MEAL 180 tablet 3  .  Multiple Vitamins-Minerals (MULTIVITAMIN & MINERAL PO) Take 1 tablet by mouth daily.    . predniSONE (DELTASONE) 20 MG tablet Take 1 tablet (20 mg total) by mouth daily with breakfast. 5 tablet 0  . sertraline (ZOLOFT) 50 MG tablet Take 0.5 tablets (25 mg total) by mouth daily. 1/2 tab po daily x 7 days and then increase to 1 tab po daily 45 tablet 1  . telmisartan (MICARDIS) 20 MG tablet TAKE 1 TABLET BY MOUTH  DAILY 90 tablet 3  . Zinc 30 MG CAPS Take by mouth.     No current facility-administered medications on file prior to visit.     Objective:  Objective  Physical Exam Constitutional:      General: She is not in acute distress.    Appearance: Normal appearance. She is not ill-appearing or toxic-appearing.  HENT:     Head: Normocephalic and atraumatic.     Right Ear: Tympanic membrane, ear canal and external ear normal.     Left Ear: Tympanic membrane, ear canal and external ear normal.     Nose: No congestion or rhinorrhea.  Eyes:     Extraocular Movements: Extraocular movements intact.      Pupils: Pupils are equal, round, and reactive to light.  Cardiovascular:     Rate and Rhythm: Normal rate and regular rhythm.     Pulses: Normal pulses.     Heart sounds: Normal heart sounds. No murmur heard.   Pulmonary:     Effort: Pulmonary effort is normal. No respiratory distress.     Breath sounds: Normal breath sounds. No wheezing, rhonchi or rales.  Abdominal:     General: Bowel sounds are normal.     Palpations: Abdomen is soft. There is no mass.     Tenderness: There is no abdominal tenderness. There is no guarding.     Hernia: No hernia is present.  Musculoskeletal:        General: Normal range of motion.     Cervical back: Normal range of motion and neck supple.  Skin:    General: Skin is warm and dry.  Neurological:     Mental Status: She is alert and oriented to person, place, and time.  Psychiatric:        Behavior: Behavior normal.    BP 114/62   Pulse 82   Temp 98.8 F (37.1 C)   Resp 18   Ht 5\' 6"  (1.676 m)   Wt 264 lb 9.6 oz (120 kg)   SpO2 95%   BMI 42.71 kg/m  Wt Readings from Last 3 Encounters:  10/28/20 264 lb 9.6 oz (120 kg)  10/04/20 262 lb (118.8 kg)  09/13/20 263 lb (119.3 kg)     Lab Results  Component Value Date   WBC 9.3 09/19/2017   HGB 12.1 09/19/2017   HCT 36.6 09/19/2017   PLT 260.0 09/19/2017   GLUCOSE 110 (H) 07/20/2020   CHOL 151 07/20/2020   TRIG 108 07/20/2020   HDL 50 07/20/2020   LDLDIRECT 110.0 08/21/2016   LDLCALC 81 07/20/2020   ALT 17 07/20/2020   AST 20 07/20/2020   NA 142 07/20/2020   K 4.4 07/20/2020   CL 100 07/20/2020   CREATININE 0.91 07/20/2020   BUN 14 07/20/2020   CO2 25 07/20/2020   TSH 0.807 07/21/2019   INR 2.0 (H) 06/21/2008   HGBA1C 6.1 (H) 07/20/2020    US BREAST LTD UNI LEFT INC AXILLA  Addendum Date: 09/17/2020   ADDENDUM REPORT: 09/17/2020  13:56 ADDENDUM: The second paragraph of the Clinical Data section should read: Personal history of malignant RIGHT breast mastectomy in 1995 and  personal history of benign excisional biopsy from the LEFT breast in 1993. Electronically Signed   By: Evangeline Dakin M.D.   On: 09/17/2020 13:56   Result Date: 09/17/2020 CLINICAL DATA:  65 year old presenting with a possible palpable lump in the UPPER INNER QUADRANT of the LEFT breast that she initially noted 2 months ago. Personal history of malignant RIGHT breast lumpectomy in 1995 and personal history of benign excisional biopsy from the LEFT breast in 1993. EXAM: DIGITAL DIAGNOSTIC UNILATERAL LEFT MAMMOGRAM WITH TOMOSYNTHESIS AND CAD; ULTRASOUND LEFT BREAST LIMITED TECHNIQUE: Left digital diagnostic mammography and breast tomosynthesis was performed. The images were evaluated with computer-aided detection.; Targeted ultrasound examination of the left breast was performed COMPARISON:  Previous exam(s). ACR Breast Density Category b: There are scattered areas of fibroglandular density. FINDINGS: Full field CC and MLO views of the LEFT breast and a spot tangential view of the area of palpable concern were obtained. No findings suspicious for malignancy. Specifically, no mammographic abnormality in the area of palpable concern in the [place]. Targeted ultrasound is performed in the area of palpable concern, demonstrating normal fibrofatty tissue at the 10 o'clock position approximately 8 cm from the nipple. No cyst, solid mass or abnormal acoustic shadowing is identified. On correlative physical exam, there is a palpable ridge of tissue in the UPPER INNER QUADRANT of the LEFT breast corresponding to what the patient is feeling, though I do not palpate a discrete mass. IMPRESSION: No mammographic or sonographic evidence of malignancy involving the LEFT breast. RECOMMENDATION: Screening LEFT mammogram in one year.(Code:SM-B-01Y) I have discussed the findings and recommendations with the patient. If applicable, a reminder letter will be sent to the patient regarding the next appointment. BI-RADS  CATEGORY  1: Negative. Electronically Signed: By: Evangeline Dakin M.D. On: 09/17/2020 10:34   MM DIAG BREAST TOMO UNI LEFT  Addendum Date: 09/17/2020   ADDENDUM REPORT: 09/17/2020 13:56 ADDENDUM: The second paragraph of the Clinical Data section should read: Personal history of malignant RIGHT breast mastectomy in 1995 and personal history of benign excisional biopsy from the LEFT breast in 1993. Electronically Signed   By: Evangeline Dakin M.D.   On: 09/17/2020 13:56   Result Date: 09/17/2020 CLINICAL DATA:  65 year old presenting with a possible palpable lump in the UPPER INNER QUADRANT of the LEFT breast that she initially noted 2 months ago. Personal history of malignant RIGHT breast lumpectomy in 1995 and personal history of benign excisional biopsy from the LEFT breast in 1993. EXAM: DIGITAL DIAGNOSTIC UNILATERAL LEFT MAMMOGRAM WITH TOMOSYNTHESIS AND CAD; ULTRASOUND LEFT BREAST LIMITED TECHNIQUE: Left digital diagnostic mammography and breast tomosynthesis was performed. The images were evaluated with computer-aided detection.; Targeted ultrasound examination of the left breast was performed COMPARISON:  Previous exam(s). ACR Breast Density Category b: There are scattered areas of fibroglandular density. FINDINGS: Full field CC and MLO views of the LEFT breast and a spot tangential view of the area of palpable concern were obtained. No findings suspicious for malignancy. Specifically, no mammographic abnormality in the area of palpable concern in the [place]. Targeted ultrasound is performed in the area of palpable concern, demonstrating normal fibrofatty tissue at the 10 o'clock position approximately 8 cm from the nipple. No cyst, solid mass or abnormal acoustic shadowing is identified. On correlative physical exam, there is a palpable ridge of tissue in the UPPER INNER  QUADRANT of the LEFT breast corresponding to what the patient is feeling, though I do not palpate a discrete mass.  IMPRESSION: No mammographic or sonographic evidence of malignancy involving the LEFT breast. RECOMMENDATION: Screening LEFT mammogram in one year.(Code:SM-B-01Y) I have discussed the findings and recommendations with the patient. If applicable, a reminder letter will be sent to the patient regarding the next appointment. BI-RADS CATEGORY  1: Negative. Electronically Signed: By: Evangeline Dakin M.D. On: 09/17/2020 10:34     Assessment & Plan:  Plan    Meds ordered this encounter  Medications  . traZODone (DESYREL) 50 MG tablet    Sig: Take 0.5-1 tablets (25-50 mg total) by mouth at bedtime as needed for sleep.    Dispense:  30 tablet    Refill:  3  . fluticasone (FLONASE) 50 MCG/ACT nasal spray    Sig: Place 2 sprays into both nostrils daily.    Dispense:  16 g    Refill:  5    Problem List Items Addressed This Visit    Vitamin D deficiency    Supplement and monitor      Relevant Orders   Vitamin D 1,25 dihydroxy   Essential hypertension   Relevant Orders   CBC with Differential/Platelet   Comprehensive metabolic panel   TSH   Diabetes mellitus, type 2 (HCC)    hgba1c acceptable, minimize simple carbs. Increase exercise as tolerated. Continue current meds      Relevant Orders   Hemoglobin A1c   Insulin, random (Completed)   Preventative health care - Primary    Patient encouraged to maintain heart healthy diet, regular exercise, adequate sleep. Consider daily probiotics. Take medications as prescribed. Labs ordered and reviewed. [facility]GM and pap and Dexa at [facility] requested.       Relevant Orders   Hemoglobin A1c   CBC with Differential/Platelet   Comprehensive metabolic panel   TSH   Vitamin D 1,25 dihydroxy   Lipid panel   Hyperlipidemia   Relevant Orders   Lipid panel   Right knee pain    Struggling with pain and swelling with a bakers cyst which is obviously symptomatic. Is causing swelling in right ankle. Raise feet above heart compression hose. Is trying to  loose weight to get her BMI under 40 so Dr Novella Olive can do her TKR right now she is swimming 1 hour a week      Memory changes    Referred to neuropsycology for evaluation. Patient still able to manage her ADLs, pay her bills etc.       Relevant Orders   Ambulatory referral to Neuropsychology   Peripheral neuropathy    Mostly keeping her up at night, did not tolerate Gabapentin. Try Trazadone 25 to 50 mg qhs f/u in 8-10 weeks      Relevant Medications   traZODone (DESYREL) 50 MG tablet    Other Visit Diagnoses    Encounter for hepatitis C screening test for low risk patient       Relevant Orders   Hepatitis C Antibody   Gout, unspecified cause, unspecified chronicity, unspecified site       Relevant Orders   Uric acid     Mammo: Last completed on 09/17/2020, repeat in 1 year for left breast Colonoscopy: Last completed on 09/06/2016, repeat in 5 years due to [facility] of colon cancer in aunt.  Follow-up: Return 8-10 weeks VV f/u then 6 mn in person f/u.   I,David Hanna,acting as a Education administrator for [employer],  MD.,have documented all relevant documentation on the behalf of Holly Homans, MD,as directed by  Holly Homans, MD while in the presence of Holly Homans, MD. I, Mosie Lukes, MD personally performed the services described in this documentation. All medical record entries made by the scribe were at my direction and in my presence. I have reviewed the chart and agree that the record reflects my personal performance and is accurate and complete

## 2020-10-28 NOTE — Patient Instructions (Signed)
Preventive Care 66 Years and Older, Female Preventive care refers to lifestyle choices and visits with your health care provider that can promote health and wellness. This includes:  A yearly physical exam. This is also called an annual wellness visit.  Regular dental and eye exams.  Immunizations.  Screening for certain conditions.  Healthy lifestyle choices, such as: ? Eating a healthy diet. ? Getting regular exercise. ? Not using drugs or products that contain nicotine and tobacco. ? Limiting alcohol use. What can I expect for my preventive care visit? Physical exam Your health care provider will check your:  Height and weight. These may be used to calculate your BMI (body mass index). BMI is a measurement that tells if you are at a healthy weight.  Heart rate and blood pressure.  Body temperature.  Skin for abnormal spots. Counseling Your health care provider may ask you questions about your:  Past medical problems.  Family's medical history.  Alcohol, tobacco, and drug use.  Emotional well-being.  Home life and relationship well-being.  Sexual activity.  Diet, exercise, and sleep habits.  History of falls.  Memory and ability to understand (cognition).  Work and work Statistician.  Pregnancy and menstrual history.  Access to firearms. What immunizations do I need? Vaccines are usually given at various ages, according to a schedule. Your health care provider will recommend vaccines for you based on your age, medical history, and lifestyle or other factors, such as travel or where you work.   What tests do I need? Blood tests  Lipid and cholesterol levels. These may be checked every 5 years, or more often depending on your overall health.  Hepatitis C test.  Hepatitis B test. Screening  Lung cancer screening. You may have this screening every year starting at age 66 if you have a 30-pack-year history of smoking and currently smoke or have quit within  the past 15 years.  Colorectal cancer screening. ? All adults should have this screening starting at age 66 and continuing until age 58. ? Your health care provider may recommend screening at age 66 if you are at increased risk. ? You will have tests every 1-10 years, depending on your results and the type of screening test.  Diabetes screening. ? This is done by checking your blood sugar (glucose) after you have not eaten for a while (fasting). ? You may have this done every 1-3 years.  Mammogram. ? This may be done every 1-2 years. ? Talk with your health care provider about how often you should have regular mammograms.  Abdominal aortic aneurysm (AAA) screening. You may need this if you are a current or former smoker.  BRCA-related cancer screening. This may be done if you have a family history of breast, ovarian, tubal, or peritoneal cancers. Other tests  STD (sexually transmitted disease) testing, if you are at risk.  Bone density scan. This is done to screen for osteoporosis. You may have this done starting at age 66. Talk with your health care provider about your test results, treatment options, and if necessary, the need for more tests. Follow these instructions at home: Eating and drinking  Eat a diet that includes fresh fruits and vegetables, whole grains, lean protein, and low-fat dairy products. Limit your intake of foods with high amounts of sugar, saturated fats, and salt.  Take vitamin and mineral supplements as recommended by your health care provider.  Do not drink alcohol if your health care provider tells you not to drink.  If you drink alcohol: ? Limit how much you have to 0-1 drink a day. ? Be aware of how much alcohol is in your drink. In the [place] one drink equals one 12 oz bottle of beer (355 mL), one 5 oz glass of wine (148 mL), or one 1 oz glass of hard liquor (44 mL).   Lifestyle  Take daily care of your teeth and gums. Brush your teeth every morning  and night with fluoride toothpaste. Floss one time each day.  Stay active. Exercise for at least 30 minutes 5 or more days each week.  Do not use any products that contain nicotine or tobacco, such as cigarettes, e-cigarettes, and chewing tobacco. If you need help quitting, ask your health care provider.  Do not use drugs.  If you are sexually active, practice safe sex. Use a condom or other form of protection in order to prevent STIs (sexually transmitted infections).  Talk with your health care provider about taking a low-dose aspirin or statin.  Find healthy ways to cope with stress, such as: ? Meditation, yoga, or listening to music. ? Journaling. ? Talking to a trusted person. ? Spending time with friends and family. Safety  Always wear your seat belt while driving or riding in a vehicle.  Do not drive: ? If you have been drinking alcohol. Do not ride with someone who has been drinking. ? When you are tired or distracted. ? While texting.  Wear a helmet and other protective equipment during sports activities.  If you have firearms in your house, make sure you follow all gun safety procedures. What's next?  Visit your health care provider once a year for an annual wellness visit.  Ask your health care provider how often you should have your eyes and teeth checked.  Stay up to date on all vaccines. This information is not intended to replace advice given to you by your health care provider. Make sure you discuss any questions you have with your health care provider. Document Revised: 05/05/2020 Document Reviewed: 05/09/2018 Elsevier Patient Education  2021 [employer]vier Inc.  

## 2020-10-28 NOTE — Assessment & Plan Note (Signed)
Patient encouraged to maintain heart healthy diet, regular exercise, adequate sleep. Consider daily probiotics. Take medications as prescribed. Labs ordered and reviewed. MGM and pap and Dexa at [facility] records requested.

## 2020-10-28 NOTE — Assessment & Plan Note (Signed)
Mostly keeping her up at night, did not tolerate Gabapentin. Try Trazadone 25 to 50 mg qhs f/u in 8-10 weeks

## 2020-10-28 NOTE — Assessment & Plan Note (Signed)
Supplement and monitor 

## 2020-10-29 LAB — COMPREHENSIVE METABOLIC PANEL
ALT: 13 U/L (ref 0–35)
AST: 14 U/L (ref 0–37)
Albumin: 4.2 g/dL (ref 3.5–5.2)
Alkaline Phosphatase: 76 U/L (ref 39–117)
BUN: 15 mg/dL (ref 6–23)
CO2: 30 mEq/L (ref 19–32)
Calcium: 10 mg/dL (ref 8.4–10.5)
Chloride: 99 mEq/L (ref 96–112)
Creatinine, Ser: 1.06 mg/dL (ref 0.40–1.20)
GFR: 54.83 mL/min — ABNORMAL LOW (ref >60.00)
Glucose, Bld: 114 mg/dL — ABNORMAL HIGH (ref 70–99)
Potassium: 4.1 mEq/L (ref 3.5–5.1)
Sodium: 142 mEq/L (ref 135–145)
Total Bilirubin: 0.5 mg/dL (ref 0.2–1.2)
Total Protein: 6.8 g/dL (ref 6.0–8.3)

## 2020-10-29 LAB — URIC ACID: Uric Acid, Serum: 6.8 mg/dL (ref 2.4–7.0)

## 2020-10-29 LAB — LIPID PANEL
Cholesterol: 159 mg/dL (ref 0–200)
HDL: 51.4 mg/dL (ref >39.00)
LDL Cholesterol: 76 mg/dL (ref 0–99)
NonHDL: 107.29
Total CHOL/HDL Ratio: 3
Triglycerides: 156 mg/dL — ABNORMAL HIGH (ref 0.0–149.0)
VLDL: 31.2 mg/dL (ref 0.0–40.0)

## 2020-10-29 NOTE — Assessment & Plan Note (Signed)
hgba1c acceptable, minimize simple carbs. Increase exercise as tolerated. Continue current meds 

## 2020-11-01 ENCOUNTER — Ambulatory Visit (INDEPENDENT_AMBULATORY_CARE_PROVIDER_SITE_OTHER): Payer: Medicare Other | Admitting: Bariatrics

## 2020-11-01 ENCOUNTER — Encounter (INDEPENDENT_AMBULATORY_CARE_PROVIDER_SITE_OTHER): Payer: Self-pay | Admitting: Bariatrics

## 2020-11-01 ENCOUNTER — Other Ambulatory Visit: Payer: Self-pay

## 2020-11-01 VITALS — BP 108/71 | HR 76 | Temp 98.6°F | Ht 66.0 in | Wt 266.0 lb

## 2020-11-01 DIAGNOSIS — Z6841 Body Mass Index (BMI) 40.0 and over, adult: Secondary | ICD-10-CM

## 2020-11-01 DIAGNOSIS — E1169 Type 2 diabetes mellitus with other specified complication: Secondary | ICD-10-CM | POA: Diagnosis not present

## 2020-11-01 DIAGNOSIS — F5089 Other specified eating disorder: Secondary | ICD-10-CM

## 2020-11-01 DIAGNOSIS — R632 Polyphagia: Secondary | ICD-10-CM | POA: Diagnosis not present

## 2020-11-01 DIAGNOSIS — E669 Obesity, unspecified: Secondary | ICD-10-CM

## 2020-11-01 LAB — VITAMIN D 1,25 DIHYDROXY
Vitamin D 1, 25 (OH)2 Total: 33 pg/mL (ref 18–72)
Vitamin D2 1, 25 (OH)2: <8 pg/mL
Vitamin D3 1, 25 (OH)2: 33 pg/mL

## 2020-11-01 LAB — INSULIN, RANDOM: Insulin: 14.6 u[IU]/mL

## 2020-11-01 LAB — HEPATITIS C ANTIBODY
Hepatitis C Ab: NONREACTIVE
SIGNAL TO CUT-OFF: 0 (ref <1.00)

## 2020-11-01 MED ORDER — BUPROPION HCL ER (SR) 150 MG PO TB12: 150.0000 mg | ORAL_TABLET | Freq: Every day | ORAL | 0 refills | Status: DC

## 2020-11-01 MED ORDER — PHENTERMINE HCL 15 MG PO CAPS: 15.0000 mg | ORAL_CAPSULE | Freq: Every day | ORAL | 0 refills | Status: DC

## 2020-11-02 ENCOUNTER — Encounter: Payer: Self-pay | Admitting: Counselor

## 2020-11-03 ENCOUNTER — Other Ambulatory Visit: Payer: Self-pay

## 2020-11-03 ENCOUNTER — Ambulatory Visit (INDEPENDENT_AMBULATORY_CARE_PROVIDER_SITE_OTHER): Payer: Medicare Other | Admitting: Medical

## 2020-11-03 VITALS — BP 134/80 | HR 96 | Temp 98.6°F | Resp 16 | Ht 66.0 in | Wt 268.0 lb

## 2020-11-03 DIAGNOSIS — J029 Acute pharyngitis, unspecified: Secondary | ICD-10-CM | POA: Diagnosis not present

## 2020-11-03 LAB — POCT RAPID STREP A (OFFICE): Rapid Strep A Screen: NEGATIVE

## 2020-11-03 MED ORDER — AZITHROMYCIN 250 MG PO TABS: ORAL_TABLET | ORAL | 0 refills | Status: AC

## 2020-11-03 NOTE — Progress Notes (Signed)
Subjective:    Patient ID: [name], female    DOB: 24-May-1956, 65 y.o.   MRN: [id number]  HPI    Pt has been sick since Sunday. Hurts to swallow and eat anything. Some left ear pain. No sinus pain.   Pt has been sneezing some. Symptoms started on Sunday.   No fever, no chills, no sweats or bodyaches.  Clear mucus when sneezing. Pt has grandchildren. Non have known sore throat.  Pt got 1 phizer vaccine and got covid before vaccine came out. Had severe severe swelling of arm and lymph node.  Pt tested for covid on Monday night and was negative. Did rapid test and was negative.    Review of Systems  Constitutional: Negative for chills, fatigue and fever.  HENT: Negative for congestion, ear discharge, ear pain, hearing loss and mouth sores.   Respiratory: Negative for cough, chest tightness, shortness of breath and wheezing.   Cardiovascular: Negative for chest pain and palpitations.  Gastrointestinal: Negative for abdominal pain.  Musculoskeletal: Negative for back pain and myalgias.  Skin: Negative for rash.  Neurological: Negative for dizziness, speech difficulty, weakness, numbness and headaches.  Hematological: Negative for adenopathy. Does not bruise/bleed easily.  Psychiatric/Behavioral: Negative for behavioral problems, confusion and dysphoric mood. The patient is not nervous/anxious.     Past Medical History:  Diagnosis Date  . Allergic rhinitis   . Allergy   . Anemia 07/17/2016  . Anxiety   . Asthma    seasonal  . Blood transfusion without reported diagnosis   . Breast cancer ([place])    right  . Bronchitis   . Colon polyps   . CPAP (continuous positive airway pressure) dependence   . Depression   . Diabetes mellitus    borderline but takes metformin  . Edema   . Family history of breast cancer in first degree relative   . Fibromyalgia   . GERD (gastroesophageal reflux disease)   . Hyperglycemia 03/05/2017  . Hyperlipidemia 08/21/2016   no meds   . Hyperparathyroidism   . Hypertension    controlled by medications  . Joint pain   . Neuromuscular disorder (HCC)    Fibromyalgia  . Obesity   . Osteoarthritis    RA  . PONV (postoperative nausea and vomiting)    occasional  . Pre-diabetes   . Sleep apnea    wears cpap  . Viral meningitis   . Vitamin D deficiency      Social History   Socioeconomic History  . Marital status: Widowed    Spouse name: Not on file  . Number of children: 1  . Years of education: Not on file  . Highest education level: 12th grade  Occupational History  . Occupation: ACCOUNTING    Employer: [employer]  Tobacco Use  . Smoking status: Never Smoker  . Smokeless tobacco: Never Used  Vaping Use  . Vaping Use: Never used  Substance and Sexual Activity  . Alcohol use: Not Currently  . Drug use: No  . Sexual activity: Not on file  Other Topics Concern  . Not on file  Social History Narrative  . Not on file   Social Determinants of Health   Financial Resource Strain: Not on file  Food Insecurity: Not on file  Transportation Needs: Not on file  Physical Activity: Not on file  Stress: Not on file  Social Connections: Not on file  Intimate Partner Violence: Not on file    Past Surgical History:  Procedure Laterality Date  . ABDOMINAL HYSTERECTOMY  1998  . ADENOIDECTOMY    . BREAST EXCISIONAL BIOPSY Left 1993   benign  . BREAST SURGERY     Tran flap due to breast cancer  . CESAREAN SECTION  1988  . COLONOSCOPY  08/14/2011  . HAND SURGERY     dog bite, right hand  . HAND SURGERY     trauma, left hand  . KNEE ARTHROSCOPY     bilateral  . left knee replacement  2010  . MASTECTOMY  01/13/94   Right breast  . parathyroid resection    . PARATHYROIDECTOMY    . POLYPECTOMY    . rectal abscess    . SEPTOPLASTY  1980  . SHOULDER ARTHROSCOPY Right 11/09/2015   Procedure: ARTHROSCOPY SHOULDER-acromioplasty, distal clavicle resection and debridement;  Surgeon: Melrose Nakayama, MD;   Location: [facility];  Service: Orthopedics;  Laterality: Right;  . shoulder arthroscopy     rotator cuff repair  . TOE SURGERY Right    paronychia and adenoma removed  . TONSILLECTOMY    . TOTAL KNEE ARTHROPLASTY  06/18/08   [facility]  . TUMOR REMOVAL  1982   , scalp    Family History  Problem Relation Age of Onset  . Emphysema Father   . Lung cancer Father        lung ca dx 35  . Other Father        prostate issues  . Diabetes Father   . Breast cancer Maternal Aunt        dx late 97s  . Colon cancer Maternal Aunt        dx 53s  . Colon polyps Maternal Aunt   . Prostate cancer Maternal Grandfather 85       d. 85y  . Heart disease Paternal Grandfather   . Heart attack Paternal Grandfather        d. 50y  . Diabetes Paternal Grandfather   . Asthma Daughter        "seasonal"  . Arthritis Maternal Grandmother   . Aneurysm Maternal Grandmother        d. brain aneurysm at 60  . Arthritis Mother   . Colon polyps Mother   . Hypertension Mother   . Sleep apnea Mother   . Multiple sclerosis Sister   . Breast cancer Sister 45       L IDC and DCIS; ER/PR+, Her2-  . Fibrocystic breast disease Sister   . Arthritis/Rheumatoid Sister   . Breast cancer Sister   . Cancer Maternal Uncle        d. mouth cancer at younger age; smoker  . Other Paternal Uncle        muscle issues - couldn't walk or talk; d. 20y  . Cancer Maternal Aunt        lymphoma, dx 57s  . Ovarian cancer Maternal Aunt        dx 40s; d. late 78s  . Lung cancer Maternal Uncle        d. 31y; former smoker  . Throat cancer Cousin        maternal 1st cousin; smoker  . Breast cancer Cousin 8       maternal 1st cousin  . Other Paternal Uncle        prostate issues  . Breast cancer Cousin        paternal 1st cousin dx late 63s  . Throat cancer Cousin        maternal  1st cousin; used SL tobacco  . Prostate cancer Cousin        maternal 1st cousin dx 25s  . Esophageal cancer Neg Hx   . Rectal cancer Neg Hx   .  Stomach cancer Neg Hx     Allergies  Allergen Reactions  . Allopurinol Hives  . Mucinex [Guaifenesin Er] Shortness Of Breath  . Penicillins Hives and Other (See Comments)    Has patient had a PCN reaction causing immediate rash, facial/tongue/throat swelling, SOB or lightheadedness with hypotension: no Has patient had a PCN reaction causing severe rash involving mucus membranes or skin necrosis: no Has patient had a PCN reaction that required hospitalization no Has patient had a PCN reaction occurring within the last 10 years: no If all of the above answers are "NO", then may proceed with Cephalosporin use.   . Prozac [Fluoxetine Hcl] Hives  . Amitriptyline Other (See Comments)    "felt weird, fatigue, dizziness"  . Lexapro [Escitalopram Oxalate]     Nausea and hypersalivation.     Current Outpatient Medications on File Prior to Visit  Medication Sig Dispense Refill  . atorvastatin (LIPITOR) 10 MG tablet Take 1 tablet (10 mg total) by mouth 2 (two) times a week. 10 tablet 3  . buPROPion (WELLBUTRIN SR) 150 MG 12 hr tablet Take 1 tablet (150 mg total) by mouth daily. 30 tablet 0  . calcium carbonate (OSCAL) 1500 (600 Ca) MG TABS tablet Take 1,500 mg by mouth daily.    . Colchicine (MITIGARE) 0.6 MG CAPS Take 0.6 mg by mouth 2 (two) times daily as needed. 60 capsule 0  . cyclobenzaprine (FLEXERIL) 5 MG tablet Take 1 tablet (5 mg total) by mouth at bedtime as needed for muscle spasms. 15 tablet 0  . diclofenac Sodium (VOLTAREN) 1 % GEL diclofenac 1 % topical gel  APPLY A SMALL AMOUNT TO AFFECTED AREA FOUR TIMES A DAY AS NEEDED FOR PAIN    . famotidine (PEPCID) 40 MG tablet Take 1 tablet (40 mg total) by mouth daily. 30 tablet 5  . fluticasone (FLONASE) 50 MCG/ACT nasal spray Place 2 sprays into both nostrils daily. 16 g 5  . furosemide (LASIX) 40 MG tablet TAKE 1 TABLET BY MOUTH  TWICE DAILY 180 tablet 3  . glucosamine-chondroitin 500-400 MG tablet Take 2 tablets by mouth 2 (two) times  daily.    . hydrochlorothiazide (MICROZIDE) 12.5 MG capsule TAKE 1 CAPSULE BY MOUTH  DAILY 90 capsule 3  . Insulin Pen Needle 32G X 6 MM MISC Use with Victoza daily 50 each 0  . liraglutide (VICTOZA) 18 MG/3ML SOPN Start 0.2m SQ once a day for 7 days, then increase to 1.217monce a day 6 mL 0  . meloxicam (MOBIC) 15 MG tablet TAKE 1 TABLET BY MOUTH  DAILY AS NEEDED FOR PAIN 90 tablet 0  . metFORMIN (GLUCOPHAGE) 500 MG tablet TAKE 1 TABLET BY MOUTH  TWICE DAILY WITH A MEAL 180 tablet 3  . Multiple Vitamins-Minerals (MULTIVITAMIN & MINERAL PO) Take 1 tablet by mouth daily.    . phentermine 15 MG capsule Take 1 capsule (15 mg total) by mouth daily with lunch. 30 capsule 0  . sertraline (ZOLOFT) 50 MG tablet Take 0.5 tablets (25 mg total) by mouth daily. 1/2 tab po daily x 7 days and then increase to 1 tab po daily 45 tablet 1  . telmisartan (MICARDIS) 20 MG tablet TAKE 1 TABLET BY MOUTH  DAILY 90 tablet 3  . traZODone (DESYREL)  50 MG tablet Take 0.5-1 tablets (25-50 mg total) by mouth at bedtime as needed for sleep. 30 tablet 3  . Zinc 30 MG CAPS Take by mouth.     No current facility-administered medications on file prior to visit.    BP 134/80   Pulse 96   Temp 98.6 F (37 C)   Resp 16   Ht '5\' 6"'  (1.676 m)   Wt 268 lb (121.6 kg)   SpO2 100%   BMI 43.26 kg/m       Objective:   Physical Exam  General  Mental Status - Alert. General Appearance - Well groomed. Not in acute distress.  Skin Rashes- No Rashes.  HEENT Head- Normal. Ear Auditory Canal - Left- Normal. Right - Normal.Tympanic Membrane- Left- Normal. Right- Normal. Eye Sclera/Conjunctiva- Left- Normal. Right- Normal. Nose & Sinuses Nasal Mucosa- Left-  No Boggy and Congested. Right- no  Boggy and  Congested.Bilateral no  maxillary and no frontal sinus pressure. Mouth & Throat Lips: Upper Lip- Normal: no dryness, cracking, pallor, cyanosis, or vesicular eruption. Lower Lip-Normal: no dryness, cracking, pallor,  cyanosis or vesicular eruption. Buccal Mucosa- Bilateral- No Aphthous ulcers. Moderate bright red posterior pharynx. Uvula mild swollen.  Size/Enlargement- Bilateral- No enlargement. Discharge- bilateral-None.  Neck Neck- Supple. No Masses.   Chest and Lung Exam Auscultation: Breath Sounds:-Clear even and unlabored.  Cardiovascular Auscultation:Rythm- Regular, rate and rhythm. Murmurs & Other Heart Sounds:Ausculatation of the heart reveal- No Murmurs.  Lymphatic Head & Neck General Head & Neck Lymphatics: Bilateral: Description- mild enlarged and tendersubmandibular lymph nodes.        Assessment & Plan:  Your strep test was negative. However, your physical exam and clinical presentation is suspicious for strep and it is important to note that rapid strep test can be falsely negative(sending out culture as well). Rx azithromycin  antibiotic today based on your exam and clinical presentation.  Recommend repeat covid test tomorrow morning. If positive let me know and would rx paxlovid.  Rest hydrate, tylenol for fever, and warm salt water gargles.   Can continue flonase for nasal congestion.  Follow up in 7 days or as needed.   Mackie Pai, PA-C

## 2020-11-03 NOTE — Patient Instructions (Addendum)
Your strep test was negative. However, your physical exam and clinical presentation is suspicious for strep and it is important to note that rapid strep test can be falsely negative(sending out culture as well). Rx azithromycin  antibiotic today based on your exam and clinical presentation.  Recommend repeat covid test tomorrow morning. If positive let me know and would rx paxlovid.  Rest hydrate, tylenol for fever, and warm salt water gargles.   Can continue flonase for nasal congestion.  Follow up in 7 days or as needed.

## 2020-11-04 ENCOUNTER — Encounter: Payer: Self-pay | Admitting: Medical

## 2020-11-05 LAB — CULTURE, GROUP A STREP
MICRO NUMBER:: 11983733
SPECIMEN QUALITY:: ADEQUATE

## 2020-11-09 NOTE — Progress Notes (Signed)
Chief Complaint:   OBESITY [name] is here to discuss her progress with her obesity treatment plan along with follow-up of her obesity related diagnoses. [name] is on the Category 4 Plan and states she is following her eating plan approximately 70% of the time. [name] states she is doing pool exercise for 60 minutes 1 time per week.  Today's visit was #: 24 Starting weight: 301 lbs Starting date: 07/21/2019 Today's weight: 266 lbs Today's date: 11/01/2020 Total lbs lost to date: 35 Total lbs lost since last in-office visit: 0  Interim History: [name] is up 4 lbs since her last visit. She states that the Wellbutrin made her sleepy and she cut back, and Saxenda is causing nausea.   Subjective:   1. Diabetes mellitus type 2 in obese ([place]) [name] is taking Wellbutrin, and she decreased it from 200 mg to 150 mg due to increased sleepiness.   2. Polyphagia [name] is taking Victoza and she notes increased nausea. She notes increased hunger in the evening.  3. Other disorder of eating [name] notes hunger in the evening and at night.  Assessment/Plan:   1. Diabetes mellitus type 2 in obese ([place]) [name] will decrease Victoza to 0.3 mg nightly and gradually increase. Good blood sugar control is important to decrease the likelihood of diabetic complications such as nephropathy, neuropathy, limb loss, blindness, coronary artery disease, and death. Intensive lifestyle modification including diet, exercise and weight loss are the first line of treatment for diabetes.   2. Polyphagia Intensive lifestyle modifications are the first line treatment for this issue. We discussed several lifestyle modifications today. [name] agreed to start phentermine 15 mg 1 capsule with lunch daily with no refills. She will continue to work on diet, exercise and weight loss efforts. Orders and follow up as documented in patient record.  Counseling Polyphagia is excessive hunger. Causes can include: low  blood sugars, hypERthyroidism, PMS, lack of sleep, stress, insulin resistance, diabetes, certain medications, and diets that are deficient in protein and fiber.   - phentermine 15 MG capsule; Take 1 capsule (15 mg total) by mouth daily with lunch.  Dispense: 30 capsule; Refill: 0  3. Other disorder of eating Behavior modification techniques were discussed today to help [name] deal with her emotional/non-hunger eating behaviors. [name] agreed to decrease Wellbutrin SR to 150 mg q daily with no refills. Orders and follow up as documented in patient record.   - buPROPion (WELLBUTRIN SR) 150 MG 12 hr tablet; Take 1 tablet (150 mg total) by mouth daily.  Dispense: 30 tablet; Refill: 0  4. Obesity with current BMI of 43.0 [name] is currently in the action stage of change. As such, her goal is to continue with weight loss efforts. She has agreed to the Category 4 Plan.   Exercise goals: As is.  Behavioral modification strategies: increasing lean protein intake, decreasing simple carbohydrates, increasing vegetables, increasing water intake, decreasing eating out, no skipping meals, meal planning and cooking strategies, keeping healthy foods in the home, and avoiding temptations.  [name] has agreed to follow-up with our clinic in 2 weeks. She was informed of the importance of frequent follow-up visits to maximize her success with intensive lifestyle modifications for her multiple health conditions.   Objective:   Blood pressure 108/71, pulse 76, temperature 98.6 F (37 C), height 5\' 6"  (1.676 m), weight 266 lb (120.7 kg), SpO2 98 %. Body mass index is 42.93 kg/m.  General: Cooperative, alert, well developed, in no acute distress. HEENT: Conjunctivae and  lids unremarkable. Cardiovascular: Regular rhythm.  Lungs: Normal work of breathing. Neurologic: No focal deficits.   Lab Results  Component Value Date   CREATININE 1.06 10/28/2020   BUN 15 10/28/2020   NA 142 10/28/2020   K 4.1  10/28/2020   CL 99 10/28/2020   CO2 30 10/28/2020   Lab Results  Component Value Date   ALT 13 10/28/2020   AST 14 10/28/2020   ALKPHOS 76 10/28/2020   BILITOT 0.5 10/28/2020   Lab Results  Component Value Date   HGBA1C 6.3 10/28/2020   HGBA1C 6.1 (H) 07/20/2020   HGBA1C 6.2 (H) 04/05/2020   HGBA1C 6.0 (H) 11/13/2019   HGBA1C 6.4 (H) 07/21/2019   Lab Results  Component Value Date   INSULIN 17.8 07/20/2020   INSULIN 19.2 04/05/2020   INSULIN 12.9 11/13/2019   INSULIN 35.9 (H) 07/21/2019   Lab Results  Component Value Date   TSH 0.82 10/28/2020   Lab Results  Component Value Date   CHOL 159 10/28/2020   HDL 51.40 10/28/2020   LDLCALC 76 10/28/2020   LDLDIRECT 110.0 08/21/2016   TRIG 156.0 (H) 10/28/2020   CHOLHDL 3 10/28/2020   Lab Results  Component Value Date   WBC 6.9 10/28/2020   HGB 12.8 10/28/2020   HCT 39.0 10/28/2020   MCV 87.3 10/28/2020   PLT 239.0 10/28/2020   No results found for: IRON, TIBC, FERRITIN  Obesity Behavioral Intervention:   Approximately 15 minutes were spent on the discussion below.  ASK: We discussed the diagnosis of obesity with [name] today and [name] agreed to give [name] permission to discuss obesity behavioral modification therapy today.  ASSESS: [name] has the diagnosis of obesity and her BMI today is 42.95. [name] is in the action stage of change.   ADVISE: [name] was educated on the multiple health risks of obesity as well as the benefit of weight loss to improve her health. She was advised of the need for long term treatment and the importance of lifestyle modifications to improve her current health and to decrease her risk of future health problems.  AGREE: Multiple dietary modification options and treatment options were discussed and [name] agreed to follow the recommendations documented in the above note.  ARRANGE: [name] was educated on the importance of frequent visits to treat obesity as outlined per CMS and  USPSTF guidelines and agreed to schedule her next follow up appointment today.  Attestation Statements:   Reviewed by clinician on day of visit: allergies, medications, problem list, medical history, surgical history, family history, social history, and previous encounter notes.   Wilhemena Durie, am acting as Location manager for [employer], DO.  I have reviewed the above documentation for accuracy and completeness, and I agree with the above. [name], DO

## 2020-11-11 ENCOUNTER — Encounter (INDEPENDENT_AMBULATORY_CARE_PROVIDER_SITE_OTHER): Payer: Self-pay

## 2020-11-15 ENCOUNTER — Ambulatory Visit (INDEPENDENT_AMBULATORY_CARE_PROVIDER_SITE_OTHER): Payer: Medicare Other | Admitting: Bariatrics

## 2020-11-16 ENCOUNTER — Encounter (INDEPENDENT_AMBULATORY_CARE_PROVIDER_SITE_OTHER): Payer: Self-pay | Admitting: Bariatrics

## 2020-11-30 ENCOUNTER — Encounter (INDEPENDENT_AMBULATORY_CARE_PROVIDER_SITE_OTHER): Payer: Self-pay | Admitting: Family Medicine

## 2020-11-30 ENCOUNTER — Ambulatory Visit (INDEPENDENT_AMBULATORY_CARE_PROVIDER_SITE_OTHER): Payer: Medicare Other | Admitting: Family Medicine

## 2020-11-30 ENCOUNTER — Other Ambulatory Visit: Payer: Self-pay

## 2020-11-30 VITALS — BP 108/74 | HR 86 | Temp 98.5°F | Ht 66.0 in | Wt 258.0 lb

## 2020-11-30 DIAGNOSIS — R632 Polyphagia: Secondary | ICD-10-CM | POA: Diagnosis not present

## 2020-11-30 DIAGNOSIS — Z6841 Body Mass Index (BMI) 40.0 and over, adult: Secondary | ICD-10-CM

## 2020-11-30 DIAGNOSIS — Z9189 Other specified personal risk factors, not elsewhere classified: Secondary | ICD-10-CM

## 2020-11-30 DIAGNOSIS — E66813 Obesity, class 3: Secondary | ICD-10-CM

## 2020-11-30 DIAGNOSIS — F5089 Other specified eating disorder: Secondary | ICD-10-CM

## 2020-11-30 DIAGNOSIS — E1169 Type 2 diabetes mellitus with other specified complication: Secondary | ICD-10-CM

## 2020-11-30 MED ORDER — VICTOZA 18 MG/3ML ~~LOC~~ SOPN: PEN_INJECTOR | SUBCUTANEOUS | 0 refills | Status: DC

## 2020-11-30 MED ORDER — BUPROPION HCL ER (SR) 150 MG PO TB12: 150.0000 mg | ORAL_TABLET | Freq: Every day | ORAL | 0 refills | Status: DC

## 2020-11-30 MED ORDER — PHENTERMINE HCL 15 MG PO CAPS: 15.0000 mg | ORAL_CAPSULE | Freq: Every day | ORAL | 0 refills | Status: DC

## 2020-12-06 NOTE — Progress Notes (Signed)
Chief Complaint:   OBESITY [name] is here to discuss her progress with her obesity treatment plan along with follow-up of her obesity related diagnoses. [name] is on the Category 4 Plan and states she is following her eating plan approximately 70% of the time. [name] states she is swimming 60 minutes 1 times per week.  Today's visit was #: 25 Starting weight: 301 lbs Starting date: 07/21/2019 Today's weight: 258 lbs Today's date: 11/30/2020 Total lbs lost to date: 43 Total lbs lost since last in-office visit: 8  Interim History: [name] feels that the new Rx of Phentermine is helping control hunger. Breakfast is 2 eggs or 1/2 PB sandwich wit coffee with cream. Lunch is steak or grilled chicken, but occasionally skipping meals. She feels guilty is she feels full. She has no control of cravings if food is in the house.  Subjective:   1. Type 2 diabetes mellitus with other specified complication, without long-term current use of insulin (HCC) [name] is on Victoza 1.2 mg. Can't increase dose of Victoza due to GI side effects.  2. Other disorder of eating Pt denies suicidal or homicidal ideations. BP well controlled today.  3. Polyphagia [name] is still experiencing increased drive to eat. She is on Phentermine 15 mg. PDMP checked and last refill was 11/01/20.  4. At risk for side effect of medication [name] is at risk for side effects for medication due to medication doe type 2 diabetes and polyphagia.   Assessment/Plan:   1. Type 2 diabetes mellitus with other specified complication, without long-term current use of insulin (HCC) Good blood sugar control is important to decrease the likelihood of diabetic complications such as nephropathy, neuropathy, limb loss, blindness, coronary artery disease, and death. Intensive lifestyle modification including diet, exercise and weight loss are the first line of treatment for diabetes.   Refill- liraglutide (VICTOZA) 18 MG/3ML SOPN;  Start 0.6mg  SQ once a day for 7 days, then increase to 1.2mg  once a day  Dispense: 6 mL; Refill: 0  2. Other disorder of eating Behavior modification techniques were discussed today to help [name] deal with her emotional/non-hunger eating behaviors.  Orders and follow up as documented in patient record.   Refill- buPROPion (WELLBUTRIN SR) 150 MG 12 hr tablet; Take 1 tablet (150 mg total) by mouth daily.  Dispense: 30 tablet; Refill: 0  3. Polyphagia Intensive lifestyle modifications are the first line treatment for this issue. We discussed several lifestyle modifications today and she will continue to work on diet, exercise and weight loss efforts. Orders and follow up as documented in patient record.  Counseling Polyphagia is excessive hunger. Causes can include: low blood sugars, hypERthyroidism, PMS, lack of sleep, stress, insulin resistance, diabetes, certain medications, and diets that are deficient in protein and fiber.   Refill- phentermine 15 MG capsule; Take 1 capsule (15 mg total) by mouth daily with lunch.  Dispense: 30 capsule; Refill: 0  4. At risk for side effect of medication [name] was given approximately 15 minutes of drug side effect counseling today.  We discussed side effect possibility and risk versus benefits. [name] agreed to the medication and will contact this office if these side effects are intolerable.  Repetitive spaced learning was employed today to elicit superior memory formation and behavioral change.   5. Obesity with current BMI of 43.0  [name] is currently in the action stage of change. As such, her goal is to continue with weight loss efforts. She has agreed to the Category 4  Plan.   Exercise goals: All adults should avoid inactivity. Some physical activity is better than none, and adults who participate in any amount of physical activity gain some health benefits.  Behavioral modification strategies: increasing lean protein intake, no skipping  meals, meal planning and cooking strategies, keeping healthy foods in the home, and planning for success.  [name] has agreed to follow-up with our clinic in 2-3 weeks. She was informed of the importance of frequent follow-up visits to maximize her success with intensive lifestyle modifications for her multiple health conditions.   Objective:   Blood pressure 108/74, pulse 86, temperature 98.5 F (36.9 C), height 5\' 6"  (1.676 m), weight 258 lb (117 kg), SpO2 98 %. Body mass index is 41.64 kg/m.  General: Cooperative, alert, well developed, in no acute distress. HEENT: Conjunctivae and lids unremarkable. Cardiovascular: Regular rhythm.  Lungs: Normal work of breathing. Neurologic: No focal deficits.   Lab Results  Component Value Date   CREATININE 1.06 10/28/2020   BUN 15 10/28/2020   NA 142 10/28/2020   K 4.1 10/28/2020   CL 99 10/28/2020   CO2 30 10/28/2020   Lab Results  Component Value Date   ALT 13 10/28/2020   AST 14 10/28/2020   ALKPHOS 76 10/28/2020   BILITOT 0.5 10/28/2020   Lab Results  Component Value Date   HGBA1C 6.3 10/28/2020   HGBA1C 6.1 (H) 07/20/2020   HGBA1C 6.2 (H) 04/05/2020   HGBA1C 6.0 (H) 11/13/2019   HGBA1C 6.4 (H) 07/21/2019   Lab Results  Component Value Date   INSULIN 17.8 07/20/2020   INSULIN 19.2 04/05/2020   INSULIN 12.9 11/13/2019   INSULIN 35.9 (H) 07/21/2019   Lab Results  Component Value Date   TSH 0.82 10/28/2020   Lab Results  Component Value Date   CHOL 159 10/28/2020   HDL 51.40 10/28/2020   LDLCALC 76 10/28/2020   LDLDIRECT 110.0 08/21/2016   TRIG 156.0 (H) 10/28/2020   CHOLHDL 3 10/28/2020   Lab Results  Component Value Date   VD25OH 53.9 07/20/2020   VD25OH 60.4 04/05/2020   VD25OH 53.3 11/13/2019   Lab Results  Component Value Date   WBC 6.9 10/28/2020   HGB 12.8 10/28/2020   HCT 39.0 10/28/2020   MCV 87.3 10/28/2020   PLT 239.0 10/28/2020   No results found for: IRON, TIBC, FERRITIN  Attestation  Statements:   Reviewed by clinician on day of visit: allergies, medications, problem list, medical history, surgical history, family history, social history, and previous encounter notes.  Coral Ceo, CMA, am acting as transcriptionist for Coralie Common, MD.  I have reviewed the above documentation for accuracy and completeness, and I agree with the above. - Jinny Blossom, MD

## 2020-12-16 ENCOUNTER — Other Ambulatory Visit: Payer: Self-pay | Admitting: Family Medicine

## 2020-12-22 ENCOUNTER — Encounter: Payer: Medicare Other | Admitting: Counselor

## 2020-12-23 ENCOUNTER — Other Ambulatory Visit: Payer: Self-pay

## 2020-12-23 ENCOUNTER — Encounter (INDEPENDENT_AMBULATORY_CARE_PROVIDER_SITE_OTHER): Payer: Self-pay | Admitting: Bariatrics

## 2020-12-23 ENCOUNTER — Telehealth: Payer: Medicare Other | Admitting: Family Medicine

## 2020-12-23 ENCOUNTER — Ambulatory Visit (INDEPENDENT_AMBULATORY_CARE_PROVIDER_SITE_OTHER): Payer: Medicare Other | Admitting: Bariatrics

## 2020-12-23 VITALS — BP 116/72 | HR 81 | Temp 98.2°F | Ht 66.0 in | Wt 260.0 lb

## 2020-12-23 DIAGNOSIS — E669 Obesity, unspecified: Secondary | ICD-10-CM

## 2020-12-23 DIAGNOSIS — F5089 Other specified eating disorder: Secondary | ICD-10-CM | POA: Diagnosis not present

## 2020-12-23 DIAGNOSIS — Z6841 Body Mass Index (BMI) 40.0 and over, adult: Secondary | ICD-10-CM | POA: Diagnosis not present

## 2020-12-23 DIAGNOSIS — E1169 Type 2 diabetes mellitus with other specified complication: Secondary | ICD-10-CM | POA: Diagnosis not present

## 2020-12-23 MED ORDER — BUPROPION HCL ER (SR) 200 MG PO TB12: 200.0000 mg | ORAL_TABLET | Freq: Every day | ORAL | 0 refills | Status: DC

## 2020-12-27 NOTE — Progress Notes (Signed)
Chief Complaint:   OBESITY Holly [name] [name] [name] here to discuss her progress with her obesity treatment plan along with follow-up of her obesity related diagnoses. Holly [name] [name] [name] on the Category 2 Plan and states she [name] following her eating plan approximately 75% of the time. Holly [name] [name] states she [name] walking for 30 minutes 3-4 times per week.  Today's visit was #: 35 Starting weight: 301 lbs Starting date: 07/21/2019 Today's weight: 260 lbs Today's date: 12/23/2020 Total lbs lost to date: 41 lbs Total lbs lost since last in-office visit: 0  Interim History: Holly [name] [name] [name] up 2 lbs since her last visit. She [name] on Phentermine. She [name] struggling in the evening with eating ice cream. .  Subjective:   1. Diabetes mellitus type 2 in obese (Holly [name] [name]) Holly [name] [name] [name] taking Victoza and Metformin.  2. Other disorder of eating Holly [name] [name] [name] taking Wellbutrin.  Assessment/Plan:   1. Diabetes mellitus type 2 in obese [facility]) Holly [name] [name] will continue her medication. Good blood sugar control [name] important to decrease the likelihood of diabetic complications such as nephropathy, neuropathy, limb loss, blindness, coronary artery disease, and death. Intensive lifestyle modification including diet, exercise and weight loss are the first line of treatment for diabetes.    2. Other disorder of eating We will refill Wellbrutin 200 mg for 1 month with no refills. She [name] up from 150 mg.Behavior modification techniques were discussed today to help Holly [name] [name].  Orders and follow up as documented in patient record.    - buPROPion (WELLBUTRIN SR) 200 MG 12 hr tablet; Take 1 tablet (200 mg total) by mouth daily.  Dispense: 30 tablet; Refill: 0  3. Obesity with current BMI of 25 Holly [name] [name] [name] currently in the action stage of change. As such, her goal [name] to continue with weight loss efforts. She has agreed to the Category 2 Plan.   Holly [name] [name] will continue meal plan. She will continue intentional  eating. She will consider Holly [name] [name] [name].  Exercise goals: No exercise has been prescribed at this time.  Behavioral modification strategies: increasing lean protein intake, decreasing simple carbohydrates, increasing vegetables, increasing water intake, decreasing eating out, no skipping meals, meal planning and cooking strategies, keeping healthy foods in the home, and planning for success.  Holly [name] [name] has agreed to follow-up with our clinic in 2 weeks. She was informed of the importance of frequent follow-up visits to maximize her success with intensive lifestyle modifications for her multiple health conditions.   Objective:   Blood pressure 116/72, pulse 81, temperature 98.2 F (36.8 C), height '5\' 6"'$  (1.676 m), weight 260 lb (117.9 kg), SpO2 98 %. Body mass index [name] 41.97 kg/m.  General: Cooperative, alert, well developed, in no acute distress. HEENT: Conjunctivae and lids unremarkable. Cardiovascular: Regular rhythm.  Lungs: Normal work of breathing. Neurologic: No focal deficits.   Lab Results  Component Value Date   CREATININE 1.06 10/28/2020   BUN 15 10/28/2020   NA 142 10/28/2020   K 4.1 10/28/2020   CL 99 10/28/2020   CO2 30 10/28/2020   Lab Results  Component Value Date   ALT 13 10/28/2020   AST 14 10/28/2020   ALKPHOS 76 10/28/2020   BILITOT 0.5 10/28/2020   Lab Results  Component Value Date   HGBA1C 6.3 10/28/2020   HGBA1C 6.1 (H) 07/20/2020   HGBA1C 6.2 (H) 04/05/2020   HGBA1C 6.0 (H) 11/13/2019   HGBA1C 6.4 (H) 07/21/2019   Lab Results  Component Value Date   INSULIN  17.8 07/20/2020   INSULIN 19.2 04/05/2020   INSULIN 12.9 11/13/2019   INSULIN 35.9 (H) 07/21/2019   Lab Results  Component Value Date   TSH 0.82 10/28/2020   Lab Results  Component Value Date   CHOL 159 10/28/2020   HDL 51.40 10/28/2020   LDLCALC 76 10/28/2020   LDLDIRECT 110.0 08/21/2016   TRIG 156.0 (H) 10/28/2020   CHOLHDL 3 10/28/2020   Lab Results   Component Value Date   VD25OH 53.9 07/20/2020   VD25OH 60.4 04/05/2020   VD25OH 53.3 11/13/2019   Lab Results  Component Value Date   WBC 6.9 10/28/2020   HGB 12.8 10/28/2020   HCT 39.0 10/28/2020   MCV 87.3 10/28/2020   PLT 239.0 10/28/2020   No results found for: IRON, TIBC, FERRITIN  Obesity Behavioral Intervention:   Approximately 15 minutes were spent on the discussion below.  ASK: We discussed the diagnosis of obesity with Holly [name] [name] today and Holly [name] [name] agreed to give [name] permission to discuss obesity behavioral modification therapy today.  ASSESS: Holly [name] [name]. Holly [name] [name] in the action stage of change.   ADVISE: Holly [name] [name] was educated on the multiple health risks of obesity as well as the benefit of weight loss to improve her health. She was advised of the need for long term treatment and the importance of lifestyle modifications to improve her current health and to decrease her risk of future health problems.  AGREE: Multiple dietary modification options and treatment options were discussed and Tahari agreed to follow the recommendations documented in the above note.  ARRANGE: Amarissa was educated on the importance of frequent visits to treat obesity as outlined per CMS and USPSTF guidelines and agreed to schedule her next follow up appointment today.  Attestation Statements:   Reviewed by clinician on day of visit: allergies, medications, problem list, medical history, surgical history, family history, social history, and previous encounter notes.  I, Lizbeth Bark, RMA, am acting as Location manager for [employer], DO.   I have reviewed the above documentation for accuracy and completeness, and I agree with the above. [name], DO

## 2020-12-28 ENCOUNTER — Encounter (INDEPENDENT_AMBULATORY_CARE_PROVIDER_SITE_OTHER): Payer: Self-pay | Admitting: Bariatrics

## 2021-01-10 ENCOUNTER — Other Ambulatory Visit: Payer: Self-pay | Admitting: Family Medicine

## 2021-01-11 NOTE — Telephone Encounter (Signed)
Patient is requesting a refill of the following medications: Requested Prescriptions   Pending Prescriptions Disp Refills   meloxicam (MOBIC) 15 MG tablet [Pharmacy Med Name: Meloxicam 15 MG Oral Tablet] 90 tablet 3    Sig: TAKE 1 TABLET BY MOUTH  DAILY AS NEEDED FOR PAIN    Date of patient request: 01/10/21 Last office visit: 11/03/20 w/ ES Date of last refill: 10/18/20 Last refill amount: 90 + 0 Follow up time period per chart: 05/02/21

## 2021-01-12 ENCOUNTER — Ambulatory Visit (INDEPENDENT_AMBULATORY_CARE_PROVIDER_SITE_OTHER): Payer: Medicare Other | Admitting: Bariatrics

## 2021-01-12 ENCOUNTER — Other Ambulatory Visit: Payer: Self-pay

## 2021-01-12 ENCOUNTER — Encounter (INDEPENDENT_AMBULATORY_CARE_PROVIDER_SITE_OTHER): Payer: Self-pay | Admitting: Bariatrics

## 2021-01-12 VITALS — BP 112/80 | HR 92 | Temp 98.1°F | Ht 66.0 in | Wt 259.0 lb

## 2021-01-12 DIAGNOSIS — R632 Polyphagia: Secondary | ICD-10-CM

## 2021-01-12 DIAGNOSIS — Z6841 Body Mass Index (BMI) 40.0 and over, adult: Secondary | ICD-10-CM | POA: Diagnosis not present

## 2021-01-12 DIAGNOSIS — E669 Obesity, unspecified: Secondary | ICD-10-CM | POA: Diagnosis not present

## 2021-01-12 DIAGNOSIS — E1169 Type 2 diabetes mellitus with other specified complication: Secondary | ICD-10-CM

## 2021-01-12 DIAGNOSIS — F3289 Other specified depressive episodes: Secondary | ICD-10-CM

## 2021-01-12 MED ORDER — PHENTERMINE HCL 15 MG PO CAPS: 15.0000 mg | ORAL_CAPSULE | Freq: Every day | ORAL | 0 refills | Status: DC

## 2021-01-12 MED ORDER — BUPROPION HCL ER (SR) 150 MG PO TB12
150.0000 mg | ORAL_TABLET | Freq: Every day | ORAL | 0 refills | Status: DC
Start: 2021-01-12 — End: 2021-02-22

## 2021-01-13 NOTE — Progress Notes (Signed)
Chief Complaint:   OBESITY [name] is here to discuss her progress with her obesity treatment plan along with follow-up of her obesity related diagnoses. [name] is on the Category 4 Plan and states she is following her eating plan approximately 70% of the time. [name] states she is swimming for 60 minutes 2 times per week and walking for 45 minutes 7 times per week.  Today's visit was #: 75 Starting weight: 301 lbs Starting date: 07/21/2019 Today's weight: 259 lbs Today's date: 01/12/2021 Total lbs lost to date: 42 lbs Total lbs lost since last in-office visit: 1 lb  Interim History: [name] is down 1 lb since her last visit. She is taking Phentermine.  Subjective:   1. Polyphagia [name] is currently taking Phentermine and Metformin.  2. Diabetes mellitus type 2 in obese (HCC) [name] is currently taking Phentermine and Metformin.  3. Other depression, with emotional eating [name] is currently taking Wellbutrin 200 mg (headaches).  Assessment/Plan:   1. Polyphagia Intensive lifestyle modifications are the first line treatment for this issue. We discussed several lifestyle modifications today and she will continue to work on diet, exercise and weight loss efforts. [name] will continue her medications. Orders and follow up as documented in patient record.  Counseling Polyphagia is excessive hunger. Causes can include: low blood sugars, hypERthyroidism, PMS, lack of sleep, stress, insulin resistance, diabetes, certain medications, and diets that are deficient in protein and fiber.    - phentermine 15 MG capsule; Take 1 capsule (15 mg total) by mouth daily with lunch.  Dispense: 30 capsule; Refill: 0  2. Diabetes mellitus type 2 in obese (HCC) [name] will continue her medications. Good blood sugar control is important to decrease the likelihood of diabetic complications such as nephropathy, neuropathy, limb loss, blindness, coronary artery disease, and death. Intensive  lifestyle modification including diet, exercise and weight loss are the first line of treatment for diabetes.    3. Other depression, with emotional eating Behavior modification techniques were discussed today to help [name] deal with her emotional/non-hunger eating behaviors.  Orders and follow up as documented in patient record.   [name] will change back to 150 mg  daily for 1 month with no refills.   - buPROPion (WELLBUTRIN SR) 150 MG 12 hr tablet; Take 1 tablet (150 mg total) by mouth daily.  Dispense: 30 tablet; Refill: 0  4. Obesity with current BMI of 41.6 [name] is currently in the action stage of change. As such, her goal is to continue with weight loss efforts. She has agreed to the Category 4 Plan.   [name] will continue meal planning. She will continue intentional eating.  We will refill Phentermine 15 mg once daily with lunch for 1 month will no refills. She will change to Keto bread and decrease carbohydrates.  Exercise goals: No exercise has been prescribed at this time.  Behavioral modification strategies: increasing lean protein intake, decreasing simple carbohydrates, increasing vegetables, increasing water intake, decreasing eating out, no skipping meals, meal planning and cooking strategies, keeping healthy foods in the home, and planning for success.  [name] has agreed to follow-up with our clinic in 2-3 weeks. She was informed of the importance of frequent follow-up visits to maximize her success with intensive lifestyle modifications for her multiple health conditions.   Objective:   Blood pressure 112/80, pulse 92, temperature 98.1 F (36.7 C), height '5\' 6"'$  (1.676 m), weight 259 lb (117.5 kg), SpO2 97 %. Body mass index is 41.8 kg/m.  General: Cooperative,  alert, well developed, in no acute distress. HEENT: Conjunctivae and lids unremarkable. Cardiovascular: Regular rhythm.  Lungs: Normal work of breathing. Neurologic: No focal deficits.   Lab Results   Component Value Date   CREATININE 1.06 10/28/2020   BUN 15 10/28/2020   NA 142 10/28/2020   K 4.1 10/28/2020   CL 99 10/28/2020   CO2 30 10/28/2020   Lab Results  Component Value Date   ALT 13 10/28/2020   AST 14 10/28/2020   ALKPHOS 76 10/28/2020   BILITOT 0.5 10/28/2020   Lab Results  Component Value Date   HGBA1C 6.3 10/28/2020   HGBA1C 6.1 (H) 07/20/2020   HGBA1C 6.2 (H) 04/05/2020   HGBA1C 6.0 (H) 11/13/2019   HGBA1C 6.4 (H) 07/21/2019   Lab Results  Component Value Date   INSULIN 17.8 07/20/2020   INSULIN 19.2 04/05/2020   INSULIN 12.9 11/13/2019   INSULIN 35.9 (H) 07/21/2019   Lab Results  Component Value Date   TSH 0.82 10/28/2020   Lab Results  Component Value Date   CHOL 159 10/28/2020   HDL 51.40 10/28/2020   LDLCALC 76 10/28/2020   LDLDIRECT 110.0 08/21/2016   TRIG 156.0 (H) 10/28/2020   CHOLHDL 3 10/28/2020   Lab Results  Component Value Date   VD25OH 53.9 07/20/2020   VD25OH 60.4 04/05/2020   VD25OH 53.3 11/13/2019   Lab Results  Component Value Date   WBC 6.9 10/28/2020   HGB 12.8 10/28/2020   HCT 39.0 10/28/2020   MCV 87.3 10/28/2020   PLT 239.0 10/28/2020   No results found for: IRON, TIBC, FERRITIN  Obesity Behavioral Intervention:   Approximately 15 minutes were spent on the discussion below.  ASK: We discussed the diagnosis of obesity with [name] today and [name] agreed to give [name] permission to discuss obesity behavioral modification therapy today.  ASSESS: [name] has the diagnosis of obesity and her BMI today is 41.8. [name] is in the action stage of change.   ADVISE: [name] was educated on the multiple health risks of obesity as well as the benefit of weight loss to improve her health. She was advised of the need for long term treatment and the importance of lifestyle modifications to improve her current health and to decrease her risk of future health problems.  AGREE: Multiple dietary modification options and  treatment options were discussed and [name] agreed to follow the recommendations documented in the above note.  ARRANGE: [name] was educated on the importance of frequent visits to treat obesity as outlined per CMS and USPSTF guidelines and agreed to schedule her next follow up appointment today.  Attestation Statements:   Reviewed by clinician on day of visit: allergies, medications, problem list, medical history, surgical history, family history, social history, and previous encounter notes.  I, Lizbeth Bark, RMA, am acting as Location manager for [employer], DO.   I have reviewed the above documentation for accuracy and completeness, and I agree with the above. Holly Lesch, DO

## 2021-01-15 ENCOUNTER — Encounter (INDEPENDENT_AMBULATORY_CARE_PROVIDER_SITE_OTHER): Payer: Self-pay | Admitting: Bariatrics

## 2021-01-20 ENCOUNTER — Encounter: Payer: Medicare Other | Admitting: Counselor

## 2021-01-21 ENCOUNTER — Other Ambulatory Visit: Payer: Self-pay

## 2021-01-21 ENCOUNTER — Encounter: Payer: Self-pay | Admitting: Family Medicine

## 2021-01-21 ENCOUNTER — Ambulatory Visit (HOSPITAL_BASED_OUTPATIENT_CLINIC_OR_DEPARTMENT_OTHER)
Admission: RE | Admit: 2021-01-21 | Discharge: 2021-01-21 | Disposition: A | Discharge status: Home / Self Care | Payer: Medicare Other | Source: Ambulatory Visit | Attending: Family Medicine | Admitting: Family Medicine

## 2021-01-21 ENCOUNTER — Telehealth: Payer: Self-pay | Admitting: Family Medicine

## 2021-01-21 ENCOUNTER — Ambulatory Visit (INDEPENDENT_AMBULATORY_CARE_PROVIDER_SITE_OTHER): Payer: Medicare Other | Admitting: Family Medicine

## 2021-01-21 VITALS — BP 126/78 | HR 92 | Temp 100.0°F | Resp 18 | Ht 66.0 in | Wt 268.6 lb

## 2021-01-21 DIAGNOSIS — M47812 Spondylosis without myelopathy or radiculopathy, cervical region: Secondary | ICD-10-CM | POA: Diagnosis not present

## 2021-01-21 DIAGNOSIS — M79602 Pain in left arm: Secondary | ICD-10-CM

## 2021-01-21 DIAGNOSIS — M19012 Primary osteoarthritis, left shoulder: Secondary | ICD-10-CM | POA: Diagnosis not present

## 2021-01-21 DIAGNOSIS — R200 Anesthesia of skin: Secondary | ICD-10-CM | POA: Diagnosis not present

## 2021-01-21 DIAGNOSIS — R202 Paresthesia of skin: Secondary | ICD-10-CM | POA: Diagnosis not present

## 2021-01-21 DIAGNOSIS — M542 Cervicalgia: Secondary | ICD-10-CM | POA: Diagnosis not present

## 2021-01-21 HISTORY — DX: Pain in left arm: M79.602

## 2021-01-21 MED ORDER — TIZANIDINE HCL 2 MG PO CAPS: 2.0000 mg | ORAL_CAPSULE | Freq: Three times a day (TID) | ORAL | 1 refills | Status: DC

## 2021-01-21 NOTE — Progress Notes (Signed)
Subjective:   By signing my name below, I, [name], attest that this documentation has been prepared under the direction and in the presence of Ann Held, DO. 01/21/2021    Patient ID: [name], female    DOB: 01-23-1956, 64 y.o.   MRN: [id number]  Chief Complaint  Patient presents with   Numbness    X1 week, tingling and numbness of the left arm. Pt states no chest pain. Pt states some nausea.     HPI Patient is in today for an office visit  She reports that for the last week, she has been experiencing numbness and tingling in her left shoulder that radiates down to her arm. She mentions that the numbness stops when she moves her arm around but yesterday, the tingling started from her neck and spread down to her arm. She also mentions that the back of her neck on the left side and her left arm have been feeling sore since yesterday. She has a history of shoulder surgery.  EKG was done at this visit and it is normal.  She denies chest pain and shortness of breath.   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  Past Medical History:  Diagnosis Date   Allergic rhinitis    Allergy    Anemia 07/17/2016   Anxiety    Asthma    seasonal   Blood transfusion without reported diagnosis    Breast cancer ([place])    right   Bronchitis    Colon polyps    CPAP (continuous positive airway pressure) dependence    Depression    Diabetes mellitus    borderline but takes metformin   Edema    Family history of breast cancer in first degree relative    Fibromyalgia    GERD (gastroesophageal reflux disease)    Hyperglycemia 03/05/2017   Hyperlipidemia 08/21/2016   no meds   Hyperparathyroidism    Hypertension    controlled by medications   Joint pain    Neuromuscular disorder ([place])    Fibromyalgia   Obesity    Osteoarthritis    RA   PONV (postoperative nausea and vomiting)    occasional   Pre-diabetes    Sleep apnea    wears cpap   Viral meningitis     Vitamin D deficiency     Past Surgical History:  Procedure Laterality Date   ABDOMINAL HYSTERECTOMY  1998   ADENOIDECTOMY     BREAST EXCISIONAL BIOPSY Left 1993   benign   BREAST SURGERY     Tran flap due to breast cancer   CESAREAN SECTION  1988   COLONOSCOPY  08/14/2011   HAND SURGERY     dog bite, right hand   HAND SURGERY     trauma, left hand   KNEE ARTHROSCOPY     bilateral   left knee replacement  2010   MASTECTOMY  01/13/94   Right breast   parathyroid resection     PARATHYROIDECTOMY     POLYPECTOMY     rectal abscess     SEPTOPLASTY  1980   SHOULDER ARTHROSCOPY Right 11/09/2015   Procedure: ARTHROSCOPY SHOULDER-acromioplasty, distal clavicle resection and debridement;  Surgeon: Melrose Nakayama, MD;  Location: [facility];  Service: Orthopedics;  Laterality: Right;   shoulder arthroscopy     rotator cuff repair   TOE SURGERY Right    paronychia and adenoma removed   TONSILLECTOMY     TOTAL KNEE ARTHROPLASTY  06/18/08  [facility]   TUMOR REMOVAL  1982   , scalp    Family History  Problem Relation Age of Onset   Emphysema Father    Lung cancer Father        lung ca dx 22   Other Father        prostate issues   Diabetes Father    Breast cancer Maternal Aunt        dx late 68s   Colon cancer Maternal Aunt        dx 71s   Colon polyps Maternal Aunt    Prostate cancer Maternal Grandfather 57       d. 85y   Heart disease Paternal Grandfather    Heart attack Paternal Grandfather        d. 50y   Diabetes Paternal Grandfather    Asthma Daughter        "seasonal"   Arthritis Maternal Grandmother    Aneurysm Maternal Grandmother        d. brain aneurysm at 1   Arthritis Mother    Colon polyps Mother    Hypertension Mother    Sleep apnea Mother    Multiple sclerosis Sister    Breast cancer Sister 86       L IDC and DCIS; ER/PR+, Her2-   Fibrocystic breast disease Sister    Arthritis/Rheumatoid Sister    Breast cancer Sister    Cancer Maternal Uncle        d.  mouth cancer at younger age; smoker   Other Paternal Uncle        muscle issues - couldn't walk or talk; d. 20y   Cancer Maternal Aunt        lymphoma, dx 42s   Ovarian cancer Maternal Aunt        dx 72s; d. late 63s   Lung cancer Maternal Uncle        d. 46y; former smoker   Throat cancer Cousin        maternal 1st cousin; smoker   Breast cancer Cousin 76       maternal 1st cousin   Other Paternal Uncle        prostate issues   Breast cancer Cousin        paternal 1st cousin dx late 17s   Throat cancer Cousin        maternal 1st cousin; used SL tobacco   Prostate cancer Cousin        maternal 1st cousin dx 54s   Esophageal cancer Neg Hx    Rectal cancer Neg Hx    Stomach cancer Neg Hx     Social History   Socioeconomic History   Marital status: Widowed    Spouse name: Not on file   Number of children: 1   Years of education: Not on file   Highest education level: 12th grade  Occupational History   Occupation: ACCOUNTING    Employer: [employer]  Tobacco Use   Smoking status: Never   Smokeless tobacco: Never  Vaping Use   Vaping Use: Never used  Substance and Sexual Activity   Alcohol use: Not Currently   Drug use: No   Sexual activity: Not on file  Other Topics Concern   Not on file  Social History Narrative   Not on file   Social Determinants of Health   Financial Resource Strain: Not on file  Food Insecurity: Not on file  Transportation Needs: Not on file  Physical Activity:  Not on file  Stress: Not on file  Social Connections: Not on file  Intimate Partner Violence: Not on file    Outpatient Medications Prior to Visit  Medication Sig Dispense Refill   atorvastatin (LIPITOR) 10 MG tablet Take 1 tablet (10 mg total) by mouth 2 (two) times a week. 10 tablet 3   buPROPion (WELLBUTRIN SR) 150 MG 12 hr tablet Take 1 tablet (150 mg total) by mouth daily. 30 tablet 0   calcium carbonate (OSCAL) 1500 (600 Ca) MG TABS tablet Take 1,500 mg by mouth  daily.     cyclobenzaprine (FLEXERIL) 5 MG tablet Take 1 tablet (5 mg total) by mouth at bedtime as needed for muscle spasms. 15 tablet 0   diclofenac Sodium (VOLTAREN) 1 % GEL diclofenac 1 % topical gel  APPLY A SMALL AMOUNT TO AFFECTED AREA FOUR TIMES A DAY AS NEEDED FOR PAIN     famotidine (PEPCID) 40 MG tablet Take 1 tablet (40 mg total) by mouth daily. 30 tablet 5   fluticasone (FLONASE) 50 MCG/ACT nasal spray Place 2 sprays into both nostrils daily. 16 g 5   furosemide (LASIX) 40 MG tablet TAKE 1 TABLET BY MOUTH  TWICE DAILY 180 tablet 3   glucosamine-chondroitin 500-400 MG tablet Take 2 tablets by mouth 2 (two) times daily.     hydrochlorothiazide (MICROZIDE) 12.5 MG capsule TAKE 1 CAPSULE BY MOUTH  DAILY 90 capsule 3   Insulin Pen Needle 32G X 6 MM MISC Use with Victoza daily 50 each 0   liraglutide (VICTOZA) 18 MG/3ML SOPN Start 0.12m SQ once a day for 7 days, then increase to 1.241monce a day 6 mL 0   meloxicam (MOBIC) 15 MG tablet TAKE 1 TABLET BY MOUTH  DAILY AS NEEDED FOR PAIN 90 tablet 3   metFORMIN (GLUCOPHAGE) 500 MG tablet TAKE 1 TABLET BY MOUTH  TWICE DAILY WITH A MEAL 180 tablet 3   Multiple Vitamins-Minerals (MULTIVITAMIN & MINERAL PO) Take 1 tablet by mouth daily.     phentermine 15 MG capsule Take 1 capsule (15 mg total) by mouth daily with lunch. 30 capsule 0   sertraline (ZOLOFT) 50 MG tablet Take 0.5 tablets (25 mg total) by mouth daily. 1/2 tab po daily x 7 days and then increase to 1 tab po daily 45 tablet 1   telmisartan (MICARDIS) 20 MG tablet TAKE 1 TABLET BY MOUTH  DAILY 90 tablet 1   traZODone (DESYREL) 50 MG tablet Take 0.5-1 tablets (25-50 mg total) by mouth at bedtime as needed for sleep. 30 tablet 3   Zinc 30 MG CAPS Take by mouth.     No facility-administered medications prior to visit.    Allergies  Allergen Reactions   Allopurinol Hives   Mucinex [Guaifenesin Er] Shortness Of Breath   Penicillins Hives and Other (See Comments)    Has patient had a  PCN reaction causing immediate rash, facial/tongue/throat swelling, SOB or lightheadedness with hypotension: no Has patient had a PCN reaction causing severe rash involving mucus membranes or skin necrosis: no Has patient had a PCN reaction that required hospitalization no Has patient had a PCN reaction occurring within the last 10 years: no If all of the above answers are "NO", then may proceed with Cephalosporin use.    Prozac [Fluoxetine Hcl] Hives   Amitriptyline Other (See Comments)    "felt weird, fatigue, dizziness"   Lexapro [Escitalopram Oxalate]     Nausea and hypersalivation.     Review of Systems  Constitutional:  Positive for fever (low-grade). Negative for chills and malaise/fatigue.  HENT:  Negative for congestion and hearing loss.   Eyes:  Negative for discharge.  Respiratory:  Negative for cough, sputum production and shortness of breath.   Cardiovascular:  Negative for chest pain, palpitations and leg swelling.  Gastrointestinal:  Negative for abdominal pain, blood in stool, constipation, diarrhea, heartburn, nausea and vomiting.  Genitourinary:  Negative for dysuria, frequency, hematuria and urgency.  Musculoskeletal:  Positive for myalgias (left arm) and neck pain (back of neck). Negative for back pain and falls.  Skin:  Negative for rash.  Neurological:  Positive for tingling (left arm). Negative for dizziness, sensory change, loss of consciousness, weakness and headaches.       (+) numbness in the left arm  Endo/Heme/Allergies:  Negative for environmental allergies. Does not bruise/bleed easily.  Psychiatric/Behavioral:  Negative for depression and suicidal ideas. The patient is not nervous/anxious and does not have insomnia.       Objective:    Physical Exam Constitutional:      General: She is not in acute distress.    Appearance: Normal appearance. She is not ill-appearing.  HENT:     Head: Normocephalic and atraumatic.     Right Ear: External ear normal.      Left Ear: External ear normal.  Eyes:     Extraocular Movements: Extraocular movements intact.     Pupils: Pupils are equal, round, and reactive to light.  Cardiovascular:     Rate and Rhythm: Normal rate and regular rhythm.     Pulses: Normal pulses.     Heart sounds: Normal heart sounds. No murmur heard.   No gallop.  Pulmonary:     Effort: Pulmonary effort is normal. No respiratory distress.     Breath sounds: Normal breath sounds. No wheezing, rhonchi or rales.  Abdominal:     General: Bowel sounds are normal. There is no distension.     Palpations: Abdomen is soft. There is no mass.     Tenderness: There is no abdominal tenderness. There is no guarding or rebound.     Hernia: No hernia is present.  Musculoskeletal:     Left upper arm: Tenderness present.     Cervical back: Normal range of motion and neck supple.     Comments: Left arm tender on palpation, full range of motion in left shoulder   Lymphadenopathy:     Cervical: No cervical adenopathy.  Skin:    General: Skin is warm and dry.  Neurological:     Mental Status: She is alert and oriented to person, place, and time.  Psychiatric:        Mood and Affect: Mood normal.        Behavior: Behavior normal.    BP 126/78 (BP Location: Left Arm, Patient Position: Sitting, Cuff Size: Large)   Pulse 92   Temp 100 F (37.8 C) (Oral)   Resp 18   Ht _0  (1.676 m)   Wt 268 lb 9.6 oz (121.8 kg)   SpO2 97%   BMI 43.35 kg/m  Wt Readings from Last 3 Encounters:  01/21/21 268 lb 9.6 oz (121.8 kg)  01/12/21 259 lb (117.5 kg)  12/23/20 260 lb (117.9 kg)   EKG 01/21/2021: NSR  Diabetic Foot Exam - Simple   No data filed    Lab Results  Component Value Date   WBC 6.9 10/28/2020   HGB 12.8 10/28/2020   HCT 39.0 10/28/2020   PLT  239.0 10/28/2020   GLUCOSE 114 (H) 10/28/2020   CHOL 159 10/28/2020   TRIG 156.0 (H) 10/28/2020   HDL 51.40 10/28/2020   LDLDIRECT 110.0 08/21/2016   LDLCALC 76 10/28/2020   ALT 13  10/28/2020   AST 14 10/28/2020   NA 142 10/28/2020   K 4.1 10/28/2020   CL 99 10/28/2020   CREATININE 1.06 10/28/2020   BUN 15 10/28/2020   CO2 30 10/28/2020   TSH 0.82 10/28/2020   INR 2.0 (H) 06/21/2008   HGBA1C 6.3 10/28/2020    Lab Results  Component Value Date   TSH 0.82 10/28/2020   Lab Results  Component Value Date   WBC 6.9 10/28/2020   HGB 12.8 10/28/2020   HCT 39.0 10/28/2020   MCV 87.3 10/28/2020   PLT 239.0 10/28/2020   Lab Results  Component Value Date   NA 142 10/28/2020   K 4.1 10/28/2020   CO2 30 10/28/2020   GLUCOSE 114 (H) 10/28/2020   BUN 15 10/28/2020   CREATININE 1.06 10/28/2020   BILITOT 0.5 10/28/2020   ALKPHOS 76 10/28/2020   AST 14 10/28/2020   ALT 13 10/28/2020   PROT 6.8 10/28/2020   ALBUMIN 4.2 10/28/2020   CALCIUM 10.0 10/28/2020   ANIONGAP 11 11/09/2015   GFR 54.83 (L) 10/28/2020   Lab Results  Component Value Date   CHOL 159 10/28/2020   Lab Results  Component Value Date   HDL 51.40 10/28/2020   Lab Results  Component Value Date   LDLCALC 76 10/28/2020   Lab Results  Component Value Date   TRIG 156.0 (H) 10/28/2020   Lab Results  Component Value Date   CHOLHDL 3 10/28/2020   Lab Results  Component Value Date   HGBA1C 6.3 10/28/2020       Assessment & Plan:   Problem List Items Addressed This Visit       Unprioritized   Left arm pain - Primary    ekg nsr With numbness/ tingling L arm Check c spine xray  Muscle relaxer prn  Tylenol / ibuprofen prn Consider ortho / neuro after xray 1      Relevant Medications   tizanidine (ZANAFLEX) 2 MG capsule   Other Relevant Orders   EKG 12-Lead (Completed)   DG Cervical Spine Complete   DG Shoulder Left     Meds ordered this encounter  Medications   tizanidine (ZANAFLEX) 2 MG capsule    Sig: Take 1 capsule (2 mg total) by mouth 3 (three) times daily.    Dispense:  30 capsule    Refill:  1    I,[name],acting as a scribe for [employer],  DO.,have documented all relevant documentation on the behalf of Ann Held, DO,as directed by  Ann Held, DO while in the presence of Ann Held, [place], DO., personally preformed the services described in this documentation.  All medical record entries made by the scribe were at my direction and in my presence.  I have reviewed the chart and discharge instructions (if applicable) and agree that the record reflects my personal performance and is accurate and complete. 01/21/2021

## 2021-01-21 NOTE — Assessment & Plan Note (Signed)
ekg nsr With numbness/ tingling L arm Check c spine xray  Muscle relaxer prn  Tylenol / ibuprofen prn Consider ortho / neuro after xray 1

## 2021-01-21 NOTE — Patient Instructions (Signed)

## 2021-01-21 NOTE — Telephone Encounter (Signed)
Pharmacy called stating the patients insurance will only cover the tablets so they need a new prescription for this medicine as tablets.   Medication: tizanidine (ZANAFLEX) 2 MG capsule  Preferred Pharmacy (with phone number or street name):  [facility], [place], [place] [address]  Phone:  [phone]  Fax:  [phone]  Agent: Please be advised that RX refills may take up to 3 business days. We ask that you follow-up with your pharmacy.

## 2021-01-25 ENCOUNTER — Other Ambulatory Visit: Payer: Self-pay

## 2021-01-25 MED ORDER — TIZANIDINE HCL 2 MG PO TABS: 2.0000 mg | ORAL_TABLET | Freq: Three times a day (TID) | ORAL | 1 refills | Status: DC

## 2021-01-25 NOTE — Telephone Encounter (Signed)
Medication resent in

## 2021-01-31 ENCOUNTER — Emergency Department (HOSPITAL_BASED_OUTPATIENT_CLINIC_OR_DEPARTMENT_OTHER)
Admission: EM | Admit: 2021-01-31 | Discharge: 2021-01-31 | Disposition: A | Discharge status: Home / Self Care | Payer: Medicare Other | Attending: Emergency Medicine | Admitting: Emergency Medicine

## 2021-01-31 ENCOUNTER — Emergency Department (HOSPITAL_BASED_OUTPATIENT_CLINIC_OR_DEPARTMENT_OTHER): Payer: Medicare Other

## 2021-01-31 ENCOUNTER — Encounter (HOSPITAL_BASED_OUTPATIENT_CLINIC_OR_DEPARTMENT_OTHER): Payer: Self-pay

## 2021-01-31 ENCOUNTER — Other Ambulatory Visit: Payer: Self-pay

## 2021-01-31 DIAGNOSIS — J45909 Unspecified asthma, uncomplicated: Secondary | ICD-10-CM | POA: Diagnosis not present

## 2021-01-31 DIAGNOSIS — E119 Type 2 diabetes mellitus without complications: Secondary | ICD-10-CM | POA: Insufficient documentation

## 2021-01-31 DIAGNOSIS — Z96659 Presence of unspecified artificial knee joint: Secondary | ICD-10-CM | POA: Diagnosis not present

## 2021-01-31 DIAGNOSIS — Z7984 Long term (current) use of oral hypoglycemic drugs: Secondary | ICD-10-CM | POA: Diagnosis not present

## 2021-01-31 DIAGNOSIS — Z7951 Long term (current) use of inhaled steroids: Secondary | ICD-10-CM | POA: Insufficient documentation

## 2021-01-31 DIAGNOSIS — Z853 Personal history of malignant neoplasm of breast: Secondary | ICD-10-CM | POA: Diagnosis not present

## 2021-01-31 DIAGNOSIS — M7989 Other specified soft tissue disorders: Secondary | ICD-10-CM | POA: Diagnosis not present

## 2021-01-31 DIAGNOSIS — M79671 Pain in right foot: Secondary | ICD-10-CM | POA: Diagnosis not present

## 2021-01-31 DIAGNOSIS — I10 Essential (primary) hypertension: Secondary | ICD-10-CM | POA: Diagnosis not present

## 2021-01-31 NOTE — ED Triage Notes (Signed)
Pt states started having swelling to right foot Saturday. C/o pain and a "knot" to top of foot starting yesterday. Ambulatory to room without difficulty. Denies injury.

## 2021-01-31 NOTE — ED Provider Notes (Signed)
[facility] Provider Note   CSN: [id number] Arrival date & time: 01/31/21  3354     History Chief Complaint  Patient presents with   Foot Pain    [name] is a 65 y.o. female.  The history is provided by the patient.  Foot Pain This is a new problem. The current episode started yesterday. The problem occurs constantly. The problem has not changed since onset.The symptoms are aggravated by walking. The symptoms are relieved by rest. She has tried nothing for the symptoms.      Past Medical History:  Diagnosis Date   Allergic rhinitis    Allergy    Anemia 07/17/2016   Anxiety    Asthma    seasonal   Blood transfusion without reported diagnosis    Breast cancer ([place])    right   Bronchitis    Colon polyps    CPAP (continuous positive airway pressure) dependence    Depression    Diabetes mellitus    borderline but takes metformin   Edema    Family history of breast cancer in first degree relative    Fibromyalgia    GERD (gastroesophageal reflux disease)    Hyperglycemia 03/05/2017   Hyperlipidemia 08/21/2016   no meds   Hyperparathyroidism    Hypertension    controlled by medications   Joint pain    Neuromuscular disorder ([place])    Fibromyalgia   Obesity    Osteoarthritis    RA   PONV (postoperative nausea and vomiting)    occasional   Pre-diabetes    Sleep apnea    wears cpap   Viral meningitis    Vitamin D deficiency     Patient Active Problem List   Diagnosis Date Noted   Left arm pain 01/21/2021   Memory changes 04/19/2020   Educated about COVID-19 virus infection 04/19/2020   Peripheral neuropathy 04/19/2020   TMJ arthralgia 04/19/2020   Urinary incontinence 10/27/2019   Other peripheral vertigo, unspecified ear 10/27/2019   Vertigo 03/14/2018   RLS (restless legs syndrome) 06/07/2017   Right knee pain 12/06/2016   Arthralgia 12/03/2016   Hyperlipidemia 08/21/2016   Anemia 07/17/2016   Genetic testing  05/15/2016   Allergic urticaria 03/27/2016   Dermographia 03/27/2016   History of breast cancer 03/27/2016   Rheumatoid arthritis ([place]) 06/24/2015   Diverticulitis of colon without hemorrhage 09/21/2014   Asthma with acute exacerbation 07/14/2014   Cough 07/06/2014   Gout of big toe 03/10/2013   Preventative health care 10/08/2012   OSA (obstructive sleep apnea) 04/23/2012   Diabetes mellitus, type 2 ([place]) 12/10/2011   Diarrhea 09/04/2011   Personal history of colonic polyps - adenoma 08/14/2011   Fatigue 08/03/2011   Ear canal dryness 08/03/2011   Palpitations 09/21/2010   Morbid obesity ([place]) 09/21/2010   PAC (premature atrial contraction) 09/21/2010   ATTENTION DEFICIT DISORDER, INATTENTIVE TYPE 09/22/2009   Acute sinusitis 09/22/2009   VIRAL MENINGITIS, HX OF 04/29/2009   Insomnia 02/15/2009   Vitamin D deficiency 03/11/2008   Fibromyalgia 03/05/2008   HYPERPARATHYROIDISM, HX OF 08/06/2007   Allergic rhinitis 07/05/2007   Osteoarthritis 02/28/2007   Edema 11/01/2006   Depression with anxiety 06/25/2006   Essential hypertension 06/25/2006   Gastroesophageal reflux disease without esophagitis 06/25/2006    Past Surgical History:  Procedure Laterality Date   ABDOMINAL HYSTERECTOMY  1998   ADENOIDECTOMY     BREAST EXCISIONAL BIOPSY Left 1993   benign   BREAST SURGERY  Tran flap due to breast cancer   CESAREAN SECTION  1988   COLONOSCOPY  08/14/2011   HAND SURGERY     dog bite, right hand   HAND SURGERY     trauma, left hand   KNEE ARTHROSCOPY     bilateral   left knee replacement  2010   MASTECTOMY  01/13/94   Right breast   parathyroid resection     PARATHYROIDECTOMY     POLYPECTOMY     rectal abscess     SEPTOPLASTY  1980   SHOULDER ARTHROSCOPY Right 11/09/2015   Procedure: ARTHROSCOPY SHOULDER-acromioplasty, distal clavicle resection and debridement;  Surgeon: Melrose Nakayama, MD;  Location: [facility];  Service: Orthopedics;  Laterality: Right;   shoulder  arthroscopy     rotator cuff repair   TOE SURGERY Right    paronychia and adenoma removed   TONSILLECTOMY     TOTAL KNEE ARTHROPLASTY  06/18/08   [place]   TUMOR REMOVAL  1982   , scalp     OB History     Gravida  1   Para  1   Term      Preterm      AB      Living         SAB      IAB      Ectopic      Multiple      Live Births  1           Family History  Problem Relation Age of Onset   Emphysema Father    Lung cancer Father        lung ca dx 44   Other Father        prostate issues   Diabetes Father    Breast cancer Maternal Aunt        dx late 54s   Colon cancer Maternal Aunt        dx 39s   Colon polyps Maternal Aunt    Prostate cancer Maternal Grandfather 67       d. 85y   Heart disease Paternal Grandfather    Heart attack Paternal Grandfather        d. 50y   Diabetes Paternal Grandfather    Asthma Daughter        "seasonal"   Arthritis Maternal Grandmother    Aneurysm Maternal Grandmother        d. brain aneurysm at 74   Arthritis Mother    Colon polyps Mother    Hypertension Mother    Sleep apnea Mother    Multiple sclerosis Sister    Breast cancer Sister 70       L IDC and DCIS; ER/PR+, Her2-   Fibrocystic breast disease Sister    Arthritis/Rheumatoid Sister    Breast cancer Sister    Cancer Maternal Uncle        d. mouth cancer at younger age; smoker   Other Paternal Uncle        muscle issues - couldn't walk or talk; d. 20y   Cancer Maternal Aunt        lymphoma, dx 43s   Ovarian cancer Maternal Aunt        dx 84s; d. late 57s   Lung cancer Maternal Uncle        d. 33y; former smoker   Throat cancer Cousin        maternal 1st cousin; smoker   Breast cancer Cousin 38  maternal 1st cousin   Other Paternal Uncle        prostate issues   Breast cancer Cousin        paternal 1st cousin dx late 63s   Throat cancer Cousin        maternal 1st cousin; used SL tobacco   Prostate cancer Cousin        maternal 1st  cousin dx 82s   Esophageal cancer Neg Hx    Rectal cancer Neg Hx    Stomach cancer Neg Hx     Social History   Tobacco Use   Smoking status: Never   Smokeless tobacco: Never  Vaping Use   Vaping Use: Never used  Substance Use Topics   Alcohol use: Not Currently   Drug use: No    Home Medications Prior to Admission medications   Medication Sig Start Date End Date Taking? Authorizing Provider  atorvastatin (LIPITOR) 10 MG tablet Take 1 tablet (10 mg total) by mouth 2 (two) times a week. 04/19/20   Mosie Lukes, MD  buPROPion (WELLBUTRIN SR) 150 MG 12 hr tablet Take 1 tablet (150 mg total) by mouth daily. 01/12/21   [name] A, DO  calcium carbonate (OSCAL) 1500 (600 Ca) MG TABS tablet Take 1,500 mg by mouth daily.    [provider]  cyclobenzaprine (FLEXERIL) 5 MG tablet Take 1 tablet (5 mg total) by mouth at bedtime as needed for muscle spasms. 07/20/20   Mosie Lukes, MD  diclofenac Sodium (VOLTAREN) 1 % GEL diclofenac 1 % topical gel  APPLY A SMALL AMOUNT TO AFFECTED AREA FOUR TIMES A DAY AS NEEDED FOR PAIN    [provider]  famotidine (PEPCID) 40 MG tablet Take 1 tablet (40 mg total) by mouth daily. 12/11/18   Mosie Lukes, MD  fluticasone (FLONASE) 50 MCG/ACT nasal spray Place 2 sprays into both nostrils daily. 10/28/20   Mosie Lukes, MD  furosemide (LASIX) 40 MG tablet TAKE 1 TABLET BY MOUTH  TWICE DAILY 04/24/20   Mosie Lukes, MD  glucosamine-chondroitin 500-400 MG tablet Take 2 tablets by mouth 2 (two) times daily.    [provider]  hydrochlorothiazide (MICROZIDE) 12.5 MG capsule TAKE 1 CAPSULE BY MOUTH  DAILY 06/14/20   Mosie Lukes, MD  Insulin Pen Needle 32G X 6 MM MISC Use with Victoza daily 09/13/20   [name] A, DO  liraglutide (VICTOZA) 18 MG/3ML SOPN Start 0.28m SQ once a day for 7 days, then increase to 1.213monce a day 11/30/20   UkLaqueta LindenMD  meloxicam (MOBIC) 15 MG tablet TAKE 1 TABLET BY MOUTH  DAILY  AS NEEDED FOR PAIN 01/11/21   BlMosie LukesMD  metFORMIN (GLUCOPHAGE) 500 MG tablet TAKE 1 TABLET BY MOUTH  TWICE DAILY WITH A MEAL 04/24/20   BlMosie LukesMD  Multiple Vitamins-Minerals (MULTIVITAMIN & MINERAL PO) Take 1 tablet by mouth daily.    [provider]  phentermine 15 MG capsule Take 1 capsule (15 mg total) by mouth daily with lunch. 01/12/21   BrJearld Lesch, DO  sertraline (ZOLOFT) 50 MG tablet Take 0.5 tablets (25 mg total) by mouth daily. 1/2 tab po daily x 7 days and then increase to 1 tab po daily 04/19/20   BlMosie LukesMD  telmisartan (MICARDIS) 20 MG tablet TAKE 1 TABLET BY MOUTH  DAILY 12/16/20   BlMosie LukesMD  tiZANidine (ZANAFLEX) 2 MG tablet Take 1 tablet (  2 mg total) by mouth 3 (three) times daily. 01/25/21   Mosie Lukes, MD  traZODone (DESYREL) 50 MG tablet Take 0.5-1 tablets (25-50 mg total) by mouth at bedtime as needed for sleep. 10/28/20   Mosie Lukes, MD  Zinc 30 MG CAPS Take by mouth.    [provider]    Allergies    Allopurinol, Mucinex [guaifenesin er], Penicillins, Prozac [fluoxetine hcl], Amitriptyline, and Lexapro [escitalopram oxalate]  Review of Systems   Review of Systems  Musculoskeletal:  Positive for arthralgias and gait problem. Negative for joint swelling, myalgias, neck pain and neck stiffness.  Skin:  Negative for pallor, rash and wound.  Neurological:  Negative for weakness and numbness.   Physical Exam Updated Vital Signs BP (!) 124/46 (BP Location: Left Arm)   Pulse 84   Temp 99.1 F (37.3 C) (Oral)   Resp 18   Ht _0  (1.676 m)   Wt 117.9 kg   SpO2 100%   BMI 41.97 kg/m   Physical Exam Constitutional:      General: She is not in acute distress.    Appearance: She is not ill-appearing.  Cardiovascular:     Pulses: Normal pulses.  Musculoskeletal:        General: Tenderness present. No swelling or deformity.     Comments: Tenderness over the right midfoot but no obvious swelling or  erythema or warmth  Skin:    General: Skin is warm.     Findings: No erythema or rash.  Neurological:     General: No focal deficit present.     Mental Status: She is alert.     Sensory: No sensory deficit.     Motor: No weakness.    ED Results / Procedures / Treatments   Labs (all labs ordered are listed, but only abnormal results are displayed) Labs Reviewed - No data to display  EKG None  Radiology DG Foot Complete Right  Result Date: 01/31/2021 CLINICAL DATA:  RIGHT foot swelling and pain for 2 days, knot at top of foot since yesterday, no known injury EXAM: RIGHT FOOT COMPLETE - 3+ VIEW COMPARISON:  07/07/2020 FINDINGS: Osseous mineralization low normal. Joint space narrowing and spur formation first MTP joint. Degenerative changes at naviculocuneiform joint and minimally ankle joint. Remaining joint spaces preserved. Small plantar calcaneal spur. No acute fracture, dislocation, or bone destruction. IMPRESSION: Scattered degenerative changes greatest at first MTP joint. No acute osseous abnormalities. Electronically Signed   By: Lavonia Dana M.D.   On: 01/31/2021 10:42    Procedures Procedures   Medications Ordered in ED Medications - No data to display  ED Course  I have reviewed the triage vital signs and the nursing notes.  Pertinent labs & imaging results that were available during my care of the patient were reviewed by me and considered in my medical decision making (see chart for details).    MDM Rules/Calculators/A&P                           [name] is here with right foot pain.  No trauma history.  Normal vitals.  No fever.  History of arthritis.  Pain in her right midfoot with tenderness on exam.  No obvious swelling or erythema.  No concern for infectious process.  X-ray shows no fracture.  Some arthritic changes.  Overall suspect arthritic type pain.  Recommend continuing her meloxicam.  We will place her in a postop  shoe.  Recommend ice and  Tylenol as well.  Recommend follow-up with primary care doctor.  Discharged in good condition.  This chart was dictated using voice recognition software.  Despite best efforts to proofread,  errors can occur which can change the documentation meaning.   Final Clinical Impression(s) / ED Diagnoses Final diagnoses:  Foot pain, right    Rx / DC Orders ED Discharge Orders     None        Lennice Sites, DO 01/31/21 1100

## 2021-01-31 NOTE — Discharge Instructions (Addendum)
X-ray shows no fracture.  Suspect may be some arthritis type pain.  Continue your meloxicam.  Recommend ice.  Use postop shoe to help take some pressure off of the joints in the feet.  Please rest as much as possible.

## 2021-01-31 NOTE — ED Notes (Signed)
Patient transported to X-ray 

## 2021-02-09 ENCOUNTER — Ambulatory Visit (INDEPENDENT_AMBULATORY_CARE_PROVIDER_SITE_OTHER): Payer: Medicare Other | Admitting: Bariatrics

## 2021-02-13 ENCOUNTER — Other Ambulatory Visit (INDEPENDENT_AMBULATORY_CARE_PROVIDER_SITE_OTHER): Payer: Self-pay | Admitting: Bariatrics

## 2021-02-13 DIAGNOSIS — R632 Polyphagia: Secondary | ICD-10-CM

## 2021-02-22 ENCOUNTER — Encounter (HOSPITAL_COMMUNITY): Payer: Self-pay | Admitting: Emergency Medicine

## 2021-02-22 ENCOUNTER — Emergency Department (HOSPITAL_BASED_OUTPATIENT_CLINIC_OR_DEPARTMENT_OTHER)
Admission: EM | Admit: 2021-02-22 | Discharge: 2021-02-23 | Disposition: A | Discharge status: Home / Self Care | Payer: Medicare Other | Attending: Emergency Medicine | Admitting: Emergency Medicine

## 2021-02-22 ENCOUNTER — Encounter (HOSPITAL_BASED_OUTPATIENT_CLINIC_OR_DEPARTMENT_OTHER): Payer: Self-pay | Admitting: *Deleted

## 2021-02-22 ENCOUNTER — Ambulatory Visit (INDEPENDENT_AMBULATORY_CARE_PROVIDER_SITE_OTHER): Payer: Medicare Other | Admitting: Bariatrics

## 2021-02-22 ENCOUNTER — Other Ambulatory Visit: Payer: Self-pay

## 2021-02-22 ENCOUNTER — Ambulatory Visit (HOSPITAL_COMMUNITY): Admission: EM | Admit: 2021-02-22 | Discharge: 2021-02-22 | Payer: Medicare Other

## 2021-02-22 ENCOUNTER — Encounter (INDEPENDENT_AMBULATORY_CARE_PROVIDER_SITE_OTHER): Payer: Self-pay | Admitting: Bariatrics

## 2021-02-22 VITALS — BP 132/90 | HR 91 | Temp 98.5°F | Ht 66.0 in | Wt 263.0 lb

## 2021-02-22 DIAGNOSIS — Z794 Long term (current) use of insulin: Secondary | ICD-10-CM | POA: Insufficient documentation

## 2021-02-22 DIAGNOSIS — R1032 Left lower quadrant pain: Secondary | ICD-10-CM | POA: Diagnosis present

## 2021-02-22 DIAGNOSIS — E119 Type 2 diabetes mellitus without complications: Secondary | ICD-10-CM | POA: Diagnosis not present

## 2021-02-22 DIAGNOSIS — K5732 Diverticulitis of large intestine without perforation or abscess without bleeding: Secondary | ICD-10-CM | POA: Diagnosis not present

## 2021-02-22 DIAGNOSIS — J45901 Unspecified asthma with (acute) exacerbation: Secondary | ICD-10-CM | POA: Diagnosis not present

## 2021-02-22 DIAGNOSIS — F3289 Other specified depressive episodes: Secondary | ICD-10-CM

## 2021-02-22 DIAGNOSIS — Z96652 Presence of left artificial knee joint: Secondary | ICD-10-CM | POA: Diagnosis not present

## 2021-02-22 DIAGNOSIS — Z853 Personal history of malignant neoplasm of breast: Secondary | ICD-10-CM | POA: Insufficient documentation

## 2021-02-22 DIAGNOSIS — Z7952 Long term (current) use of systemic steroids: Secondary | ICD-10-CM | POA: Insufficient documentation

## 2021-02-22 DIAGNOSIS — Z9011 Acquired absence of right breast and nipple: Secondary | ICD-10-CM | POA: Diagnosis not present

## 2021-02-22 DIAGNOSIS — I10 Essential (primary) hypertension: Secondary | ICD-10-CM | POA: Diagnosis not present

## 2021-02-22 DIAGNOSIS — Z79899 Other long term (current) drug therapy: Secondary | ICD-10-CM | POA: Insufficient documentation

## 2021-02-22 DIAGNOSIS — Z6841 Body Mass Index (BMI) 40.0 and over, adult: Secondary | ICD-10-CM | POA: Diagnosis not present

## 2021-02-22 DIAGNOSIS — K5792 Diverticulitis of intestine, part unspecified, without perforation or abscess without bleeding: Secondary | ICD-10-CM | POA: Diagnosis not present

## 2021-02-22 DIAGNOSIS — Z7984 Long term (current) use of oral hypoglycemic drugs: Secondary | ICD-10-CM | POA: Diagnosis not present

## 2021-02-22 DIAGNOSIS — K219 Gastro-esophageal reflux disease without esophagitis: Secondary | ICD-10-CM | POA: Insufficient documentation

## 2021-02-22 DIAGNOSIS — R109 Unspecified abdominal pain: Secondary | ICD-10-CM | POA: Diagnosis not present

## 2021-02-22 DIAGNOSIS — R632 Polyphagia: Secondary | ICD-10-CM | POA: Diagnosis not present

## 2021-02-22 LAB — CBC
HCT: 38.6 % (ref 36.0–46.0)
Hemoglobin: 12.6 g/dL (ref 12.0–15.0)
MCH: 28.6 pg (ref 26.0–34.0)
MCHC: 32.6 g/dL (ref 30.0–36.0)
MCV: 87.5 fL (ref 80.0–100.0)
Platelets: 227 10*3/uL (ref 150–400)
RBC: 4.41 MIL/uL (ref 3.87–5.11)
RDW: 13.9 % (ref 11.5–15.5)
WBC: 9 10*3/uL (ref 4.0–10.5)
nRBC: 0 % (ref 0.0–0.2)

## 2021-02-22 LAB — COMPREHENSIVE METABOLIC PANEL
ALT: 16 U/L (ref 0–44)
AST: 15 U/L (ref 15–41)
Albumin: 3.8 g/dL (ref 3.5–5.0)
Alkaline Phosphatase: 69 U/L (ref 38–126)
Anion gap: 8 (ref 5–15)
BUN: 17 mg/dL (ref 8–23)
CO2: 31 mmol/L (ref 22–32)
Calcium: 9.2 mg/dL (ref 8.9–10.3)
Chloride: 98 mmol/L (ref 98–111)
Creatinine, Ser: 1.19 mg/dL — ABNORMAL HIGH (ref 0.44–1.00)
GFR, Estimated: 50 mL/min — ABNORMAL LOW (ref >60)
Glucose, Bld: 113 mg/dL — ABNORMAL HIGH (ref 70–99)
Potassium: 3.1 mmol/L — ABNORMAL LOW (ref 3.5–5.1)
Sodium: 137 mmol/L (ref 135–145)
Total Bilirubin: 0.3 mg/dL (ref 0.3–1.2)
Total Protein: 7.3 g/dL (ref 6.5–8.1)

## 2021-02-22 LAB — LIPASE, BLOOD: Lipase: 30 U/L (ref 11–51)

## 2021-02-22 MED ORDER — BUPROPION HCL ER (SR) 150 MG PO TB12: 150.0000 mg | ORAL_TABLET | Freq: Every day | ORAL | 0 refills | Status: DC

## 2021-02-22 MED ORDER — PHENTERMINE HCL 30 MG PO CAPS: 30.0000 mg | ORAL_CAPSULE | ORAL | 0 refills | Status: DC

## 2021-02-22 MED ORDER — SODIUM CHLORIDE 0.9 % IV BOLUS
1000.0000 mL | Freq: Once | INTRAVENOUS | Status: AC
  Administered 2021-02-23: 1000 mL via INTRAVENOUS

## 2021-02-22 NOTE — ED Triage Notes (Signed)
Pt c/o LLQ abdominal cramping that started last night.

## 2021-02-22 NOTE — ED Triage Notes (Signed)
C/o left lower abd pain  x 2 days , DX diverticulitis at [facility] today and sent here  for CT scan  and labs

## 2021-02-22 NOTE — ED Provider Notes (Signed)
[place]    CSN: [id number] Arrival date & time: 02/22/21  1751      History   Chief Complaint Chief Complaint  Patient presents with   Abdominal Pain    Left side     HPI [name] is a 65 y.o. female presenting with severe left-sided abdominal pain for 2 days.  Medical history diverticulitis, GERD.  Patient states that she has had severe left lower quadrant pain for about 2 days, rates this as a 10 out of 10 and has pain with movement.  States this does seem similar to the last time she had diverticulitis, this is the second time it has been this severe.  Denies other changes in bowel or bladder function including nausea, vomiting, constipation, diarrhea, other abdominal pain.  Feeling well otherwise, denies cough, fever/chills. Denies urinary symptoms.  HPI  Past Medical History:  Diagnosis Date   Allergic rhinitis    Allergy    Anemia 07/17/2016   Anxiety    Asthma    seasonal   Blood transfusion without reported diagnosis    Breast cancer ([place])    right   Bronchitis    Colon polyps    CPAP (continuous positive airway pressure) dependence    Depression    Diabetes mellitus    borderline but takes metformin   Edema    Family history of breast cancer in first degree relative    Fibromyalgia    GERD (gastroesophageal reflux disease)    Hyperglycemia 03/05/2017   Hyperlipidemia 08/21/2016   no meds   Hyperparathyroidism    Hypertension    controlled by medications   Joint pain    Neuromuscular disorder ([place])    Fibromyalgia   Obesity    Osteoarthritis    RA   PONV (postoperative nausea and vomiting)    occasional   Pre-diabetes    Sleep apnea    wears cpap   Viral meningitis    Vitamin D deficiency     Patient Active Problem List   Diagnosis Date Noted   Left arm pain 01/21/2021   Memory changes 04/19/2020   Educated about COVID-19 virus infection 04/19/2020   Peripheral neuropathy 04/19/2020   TMJ arthralgia 04/19/2020    Urinary incontinence 10/27/2019   Other peripheral vertigo, unspecified ear 10/27/2019   Vertigo 03/14/2018   RLS (restless legs syndrome) 06/07/2017   Right knee pain 12/06/2016   Arthralgia 12/03/2016   Hyperlipidemia 08/21/2016   Anemia 07/17/2016   Genetic testing 05/15/2016   Allergic urticaria 03/27/2016   Dermographia 03/27/2016   History of breast cancer 03/27/2016   Rheumatoid arthritis ([place]) 06/24/2015   Diverticulitis of colon without hemorrhage 09/21/2014   Asthma with acute exacerbation 07/14/2014   Cough 07/06/2014   Gout of big toe 03/10/2013   Preventative health care 10/08/2012   OSA (obstructive sleep apnea) 04/23/2012   Diabetes mellitus, type 2 ([place]) 12/10/2011   Diarrhea 09/04/2011   Personal history of colonic polyps - adenoma 08/14/2011   Fatigue 08/03/2011   Ear canal dryness 08/03/2011   Palpitations 09/21/2010   Morbid obesity ([place]) 09/21/2010   PAC (premature atrial contraction) 09/21/2010   ATTENTION DEFICIT DISORDER, INATTENTIVE TYPE 09/22/2009   Acute sinusitis 09/22/2009   VIRAL MENINGITIS, HX OF 04/29/2009   Insomnia 02/15/2009   Vitamin D deficiency 03/11/2008   Fibromyalgia 03/05/2008   HYPERPARATHYROIDISM, HX OF 08/06/2007   Allergic rhinitis 07/05/2007   Osteoarthritis 02/28/2007   Edema 11/01/2006   Depression with anxiety 06/25/2006  Essential hypertension 06/25/2006   Gastroesophageal reflux disease without esophagitis 06/25/2006    Past Surgical History:  Procedure Laterality Date   ABDOMINAL HYSTERECTOMY  1998   ADENOIDECTOMY     BREAST EXCISIONAL BIOPSY Left 1993   benign   BREAST SURGERY     Tran flap due to breast cancer   CESAREAN SECTION  1988   COLONOSCOPY  08/14/2011   HAND SURGERY     dog bite, right hand   HAND SURGERY     trauma, left hand   KNEE ARTHROSCOPY     bilateral   left knee replacement  2010   MASTECTOMY  01/13/94   Right breast   parathyroid resection     PARATHYROIDECTOMY     POLYPECTOMY      rectal abscess     SEPTOPLASTY  1980   SHOULDER ARTHROSCOPY Right 11/09/2015   Procedure: ARTHROSCOPY SHOULDER-acromioplasty, distal clavicle resection and debridement;  Surgeon: Melrose Nakayama, MD;  Location: [place];  Service: Orthopedics;  Laterality: Right;   shoulder arthroscopy     rotator cuff repair   TOE SURGERY Right    paronychia and adenoma removed   TONSILLECTOMY     TOTAL KNEE ARTHROPLASTY  06/18/08   [place]   TUMOR REMOVAL  1982   , scalp    OB History     Gravida  1   Para  1   Term      Preterm      AB      Living         SAB      IAB      Ectopic      Multiple      Live Births  1            Home Medications    Prior to Admission medications   Medication Sig Start Date End Date Taking? Authorizing Provider  atorvastatin (LIPITOR) 10 MG tablet Take 1 tablet (10 mg total) by mouth 2 (two) times a week. 04/19/20  Yes Mosie Lukes, MD  buPROPion [facility] SR) 150 MG 12 hr tablet Take 1 tablet (150 mg total) by mouth daily. 02/22/21  Yes [name] A, DO  calcium carbonate (OSCAL) 1500 (600 Ca) MG TABS tablet Take 1,500 mg by mouth daily.   Yes [provider]  famotidine (PEPCID) 40 MG tablet Take 1 tablet (40 mg total) by mouth daily. 12/11/18  Yes Mosie Lukes, MD  fluticasone (FLONASE) 50 MCG/ACT nasal spray Place 2 sprays into both nostrils daily. 10/28/20  Yes Mosie Lukes, MD  furosemide (LASIX) 40 MG tablet TAKE 1 TABLET BY MOUTH  TWICE DAILY 04/24/20  Yes Mosie Lukes, MD  glucosamine-chondroitin 500-400 MG tablet Take 2 tablets by mouth 2 (two) times daily.   Yes [provider]  hydrochlorothiazide (MICROZIDE) 12.5 MG capsule TAKE 1 CAPSULE BY MOUTH  DAILY 06/14/20  Yes Mosie Lukes, MD  Insulin Pen Needle 32G X 6 MM MISC Use with Victoza daily 09/13/20  Yes [name] A, DO  liraglutide (VICTOZA) 18 MG/3ML SOPN Start 0.16m SQ once a day for 7 days, then increase to 1.26monce a day 11/30/20  Yes UkLaqueta LindenMD  meloxicam (MOBIC) 15 MG tablet TAKE 1 TABLET BY MOUTH  DAILY AS NEEDED FOR PAIN 01/11/21  Yes BlMosie LukesMD  metFORMIN (GLUCOPHAGE) 500 MG tablet TAKE 1 TABLET BY MOUTH  TWICE DAILY WITH A MEAL 04/24/20  Yes BlPenni Homans  A, MD  Multiple Vitamins-Minerals (MULTIVITAMIN & MINERAL PO) Take 1 tablet by mouth daily.   Yes [provider]  phentermine 30 MG capsule Take 1 capsule (30 mg total) by mouth every morning. 02/22/21  Yes [name] A, DO  sertraline (ZOLOFT) 50 MG tablet Take 0.5 tablets (25 mg total) by mouth daily. 1/2 tab po daily x 7 days and then increase to 1 tab po daily 04/19/20  Yes Mosie Lukes, MD  telmisartan (MICARDIS) 20 MG tablet TAKE 1 TABLET BY MOUTH  DAILY 12/16/20  Yes Mosie Lukes, MD  tiZANidine (ZANAFLEX) 2 MG tablet Take 1 tablet (2 mg total) by mouth 3 (three) times daily. 01/25/21  Yes Mosie Lukes, MD  traZODone (DESYREL) 50 MG tablet Take 0.5-1 tablets (25-50 mg total) by mouth at bedtime as needed for sleep. 10/28/20  Yes Mosie Lukes, MD  Zinc 30 MG CAPS Take by mouth.   Yes [provider]  cyclobenzaprine (FLEXERIL) 5 MG tablet Take 1 tablet (5 mg total) by mouth at bedtime as needed for muscle spasms. 07/20/20   Mosie Lukes, MD  diclofenac Sodium (VOLTAREN) 1 % GEL diclofenac 1 % topical gel  APPLY A SMALL AMOUNT TO AFFECTED AREA FOUR TIMES A DAY AS NEEDED FOR PAIN    [provider]    Family History Family History  Problem Relation Age of Onset   Emphysema Father    Lung cancer Father        lung ca dx 85   Other Father        prostate issues   Diabetes Father    Breast cancer Maternal Aunt        dx late 75s   Colon cancer Maternal Aunt        dx 68s   Colon polyps Maternal Aunt    Prostate cancer Maternal Grandfather 85       d. 85y   Heart disease Paternal Grandfather    Heart attack Paternal Grandfather        d. 50y   Diabetes Paternal Grandfather    Asthma Daughter         "seasonal"   Arthritis Maternal Grandmother    Aneurysm Maternal Grandmother        d. brain aneurysm at 35   Arthritis Mother    Colon polyps Mother    Hypertension Mother    Sleep apnea Mother    Multiple sclerosis Sister    Breast cancer Sister 45       L IDC and DCIS; ER/PR+, Her2-   Fibrocystic breast disease Sister    Arthritis/Rheumatoid Sister    Breast cancer Sister    Cancer Maternal Uncle        d. mouth cancer at younger age; smoker   Other Paternal Uncle        muscle issues - couldn't walk or talk; d. 20y   Cancer Maternal Aunt        lymphoma, dx 2s   Ovarian cancer Maternal Aunt        dx 65s; d. late 41s   Lung cancer Maternal Uncle        d. 51y; former smoker   Throat cancer Cousin        maternal 1st cousin; smoker   Breast cancer Cousin 56       maternal 1st cousin   Other Paternal Uncle        prostate issues   Breast cancer  Cousin        paternal 1st cousin dx late 80s   Throat cancer Cousin        maternal 1st cousin; used SL tobacco   Prostate cancer Cousin        maternal 1st cousin dx 80s   Esophageal cancer Neg Hx    Rectal cancer Neg Hx    Stomach cancer Neg Hx     Social History Social History   Tobacco Use   Smoking status: Never   Smokeless tobacco: Never  Vaping Use   Vaping Use: Never used  Substance Use Topics   Alcohol use: Not Currently   Drug use: No     Allergies   Allopurinol, Mucinex [guaifenesin er], Penicillins, Prozac [fluoxetine hcl], Amitriptyline, and Lexapro [escitalopram oxalate]   Review of Systems Review of Systems  Gastrointestinal:  Positive for abdominal pain.  All other systems reviewed and are negative.   Physical Exam Triage Vital Signs ED Triage Vitals  Enc Vitals Group     BP 02/22/21 1833 (!) 146/88     Pulse Rate 02/22/21 1833 89     Resp --      Temp 02/22/21 1833 98.5 F (36.9 C)     Temp Source 02/22/21 1833 Oral     SpO2 02/22/21 1833 100 %     Weight --      Height --       Head Circumference --      Peak Flow --      Pain Score 02/22/21 1830 9     Pain Loc --      Pain Edu? --      Excl. in [place]? --    No data found.  Updated Vital Signs BP (!) 146/88 (BP Location: Left Arm)   Pulse 89   Temp 98.5 F (36.9 C) (Oral)   SpO2 100%   Visual Acuity Right Eye Distance:   Left Eye Distance:   Bilateral Distance:    Right Eye Near:   Left Eye Near:    Bilateral Near:     Physical Exam Vitals reviewed.  Constitutional:      General: She is not in acute distress.    Appearance: Normal appearance. She is not ill-appearing.  HENT:     Head: Normocephalic and atraumatic.     Mouth/Throat:     Mouth: Mucous membranes are moist.     Comments: Moist mucous membranes Eyes:     Extraocular Movements: Extraocular movements intact.     Pupils: Pupils are equal, round, and reactive to light.  Cardiovascular:     Rate and Rhythm: Normal rate and regular rhythm.     Heart sounds: Normal heart sounds.  Pulmonary:     Effort: Pulmonary effort is normal.     Breath sounds: Normal breath sounds. No wheezing, rhonchi or rales.  Abdominal:     General: Bowel sounds are normal. There is no distension.     Palpations: Abdomen is soft. There is no mass.     Tenderness: There is abdominal tenderness in the left lower quadrant. There is guarding and rebound. There is no right CVA tenderness or left CVA tenderness.     Comments: LLQ tenderness with guarding and rebound. Patient uncomfortable throughout exam.  Skin:    General: Skin is warm.     Capillary Refill: Capillary refill takes less than 2 seconds.     Comments: Good skin turgor  Neurological:     General: No focal  deficit present.     Mental Status: She is alert and oriented to person, place, and time.  Psychiatric:        Mood and Affect: Mood normal.        Behavior: Behavior normal.     UC Treatments / Results  Labs (all labs ordered are listed, but only abnormal results are displayed) Labs  Reviewed - No data to display  EKG   Radiology No results found.  Procedures Procedures (including critical care time)  Medications Ordered in UC Medications - No data to display  Initial Impression / Assessment and Plan / UC Course  I have reviewed the triage vital signs and the nursing notes.  Pertinent labs & imaging results that were available during my care of the patient were reviewed by me and considered in my medical decision making (see chart for details).     This patient is a very pleasant 65 y.o. year old female presenting with LLQ pain. Afebrile, nontachy. I do have concern for acute diverticulitis. Referred to ED, patient in agreement, states she will head straight there.   Final Clinical Impressions(s) / UC Diagnoses   Final diagnoses:  LLQ abdominal pain  Diverticulitis of colon     Discharge Instructions      -Please head to the emergency department for further evaluation and management of your severe abdominal pain, and concern for diverticulitis or other intra-abdominal pathology.  Please head straight there, call 911 if symptoms get worse on the way.   ED Prescriptions   None    PDMP not reviewed this encounter.   Hazel Sams, PA-C 02/22/21 2022

## 2021-02-22 NOTE — Discharge Instructions (Signed)
-  Please head to the emergency department for further evaluation and management of your severe abdominal pain, and concern for diverticulitis or other intra-abdominal pathology.  Please head straight there, call 911 if symptoms get worse on the way.

## 2021-02-22 NOTE — ED Provider Notes (Signed)
[facility] Provider Note   CSN: [id number] Arrival date & time: 02/22/21  2013     History Chief Complaint  Patient presents with   Abdominal Pain    [name] is a 65 y.o. female.  Patient is 65 year old female with past medical history of diabetes, hyperlipidemia, hypertension.  Patient presenting with complaints of left lower quadrant pain.  This is been worsening over the past 2 days.  Pain is constant and worsening.  She denies any bowel or bladder complaints.  Patient reports a history of diverticulitis several years ago and this feels similar.  She was seen at urgent care, then sent here for further work-up.  The history is provided by the patient.  Abdominal Pain Pain location:  LLQ Pain quality: stabbing   Pain radiates to:  Does not radiate Pain severity:  Moderate Onset quality:  Gradual Duration:  2 days Timing:  Constant Progression:  Worsening Chronicity:  New Relieved by:  Nothing Worsened by:  Movement and palpation Ineffective treatments:  None tried     Past Medical History:  Diagnosis Date   Allergic rhinitis    Allergy    Anemia 07/17/2016   Anxiety    Asthma    seasonal   Blood transfusion without reported diagnosis    Breast cancer ([place])    right   Bronchitis    Colon polyps    CPAP (continuous positive airway pressure) dependence    Depression    Diabetes mellitus    borderline but takes metformin   Edema    Family history of breast cancer in first degree relative    Fibromyalgia    GERD (gastroesophageal reflux disease)    Hyperglycemia 03/05/2017   Hyperlipidemia 08/21/2016   no meds   Hyperparathyroidism    Hypertension    controlled by medications   Joint pain    Neuromuscular disorder ([place])    Fibromyalgia   Obesity    Osteoarthritis    RA   PONV (postoperative nausea and vomiting)    occasional   Pre-diabetes    Sleep apnea    wears cpap   Viral meningitis    Vitamin D  deficiency     Patient Active Problem List   Diagnosis Date Noted   Left arm pain 01/21/2021   Memory changes 04/19/2020   Educated about COVID-19 virus infection 04/19/2020   Peripheral neuropathy 04/19/2020   TMJ arthralgia 04/19/2020   Urinary incontinence 10/27/2019   Other peripheral vertigo, unspecified ear 10/27/2019   Vertigo 03/14/2018   RLS (restless legs syndrome) 06/07/2017   Right knee pain 12/06/2016   Arthralgia 12/03/2016   Hyperlipidemia 08/21/2016   Anemia 07/17/2016   Genetic testing 05/15/2016   Allergic urticaria 03/27/2016   Dermographia 03/27/2016   History of breast cancer 03/27/2016   Rheumatoid arthritis ([place]) 06/24/2015   Diverticulitis of colon without hemorrhage 09/21/2014   Asthma with acute exacerbation 07/14/2014   Cough 07/06/2014   Gout of big toe 03/10/2013   Preventative health care 10/08/2012   OSA (obstructive sleep apnea) 04/23/2012   Diabetes mellitus, type 2 ([place]) 12/10/2011   Diarrhea 09/04/2011   Personal history of colonic polyps - adenoma 08/14/2011   Fatigue 08/03/2011   Ear canal dryness 08/03/2011   Palpitations 09/21/2010   Morbid obesity ([place]) 09/21/2010   PAC (premature atrial contraction) 09/21/2010   ATTENTION DEFICIT DISORDER, INATTENTIVE TYPE 09/22/2009   Acute sinusitis 09/22/2009   VIRAL MENINGITIS, HX OF 04/29/2009   Insomnia 02/15/2009  Vitamin D deficiency 03/11/2008   Fibromyalgia 03/05/2008   HYPERPARATHYROIDISM, HX OF 08/06/2007   Allergic rhinitis 07/05/2007   Osteoarthritis 02/28/2007   Edema 11/01/2006   Depression with anxiety 06/25/2006   Essential hypertension 06/25/2006   Gastroesophageal reflux disease without esophagitis 06/25/2006    Past Surgical History:  Procedure Laterality Date   ABDOMINAL HYSTERECTOMY  1998   ADENOIDECTOMY     BREAST EXCISIONAL BIOPSY Left 1993   benign   BREAST SURGERY     Tran flap due to breast cancer   CESAREAN SECTION  1988   COLONOSCOPY  08/14/2011    HAND SURGERY     dog bite, right hand   HAND SURGERY     trauma, left hand   KNEE ARTHROSCOPY     bilateral   left knee replacement  2010   MASTECTOMY  01/13/94   Right breast   parathyroid resection     PARATHYROIDECTOMY     POLYPECTOMY     rectal abscess     SEPTOPLASTY  1980   SHOULDER ARTHROSCOPY Right 11/09/2015   Procedure: ARTHROSCOPY SHOULDER-acromioplasty, distal clavicle resection and debridement;  Surgeon: Melrose Nakayama, MD;  Location: [facility];  Service: Orthopedics;  Laterality: Right;   shoulder arthroscopy     rotator cuff repair   TOE SURGERY Right    paronychia and adenoma removed   TONSILLECTOMY     TOTAL KNEE ARTHROPLASTY  06/18/08   [facility]   TUMOR REMOVAL  1982   , scalp     OB History     Gravida  1   Para  1   Term      Preterm      AB      Living         SAB      IAB      Ectopic      Multiple      Live Births  1           Family History  Problem Relation Age of Onset   Emphysema Father    Lung cancer Father        lung ca dx 21   Other Father        prostate issues   Diabetes Father    Breast cancer Maternal Aunt        dx late 5s   Colon cancer Maternal Aunt        dx 42s   Colon polyps Maternal Aunt    Prostate cancer Maternal Grandfather 37       d. 85y   Heart disease Paternal Grandfather    Heart attack Paternal Grandfather        d. 50y   Diabetes Paternal Grandfather    Asthma Daughter        "seasonal"   Arthritis Maternal Grandmother    Aneurysm Maternal Grandmother        d. brain aneurysm at 20   Arthritis Mother    Colon polyps Mother    Hypertension Mother    Sleep apnea Mother    Multiple sclerosis Sister    Breast cancer Sister 14       L IDC and DCIS; ER/PR+, Her2-   Fibrocystic breast disease Sister    Arthritis/Rheumatoid Sister    Breast cancer Sister    Cancer Maternal Uncle        d. mouth cancer at younger age; smoker   Other Paternal Uncle  muscle issues - couldn't walk or  talk; d. 20y   Cancer Maternal Aunt        lymphoma, dx 31s   Ovarian cancer Maternal Aunt        dx 37s; d. late 93s   Lung cancer Maternal Uncle        d. 68y; former smoker   Throat cancer Cousin        maternal 1st cousin; smoker   Breast cancer Cousin 20       maternal 1st cousin   Other Paternal Uncle        prostate issues   Breast cancer Cousin        paternal 1st cousin dx late 53s   Throat cancer Cousin        maternal 1st cousin; used SL tobacco   Prostate cancer Cousin        maternal 1st cousin dx 44s   Esophageal cancer Neg Hx    Rectal cancer Neg Hx    Stomach cancer Neg Hx     Social History   Tobacco Use   Smoking status: Never   Smokeless tobacco: Never  Vaping Use   Vaping Use: Never used  Substance Use Topics   Alcohol use: Not Currently   Drug use: No    Home Medications Prior to Admission medications   Medication Sig Start Date End Date Taking? Authorizing Provider  atorvastatin (LIPITOR) 10 MG tablet Take 1 tablet (10 mg total) by mouth 2 (two) times a week. 04/19/20   Mosie Lukes, MD  buPROPion (WELLBUTRIN SR) 150 MG 12 hr tablet Take 1 tablet (150 mg total) by mouth daily. 02/22/21   [name] A, DO  calcium carbonate (OSCAL) 1500 (600 Ca) MG TABS tablet Take 1,500 mg by mouth daily.    [provider]  cyclobenzaprine (FLEXERIL) 5 MG tablet Take 1 tablet (5 mg total) by mouth at bedtime as needed for muscle spasms. 07/20/20   Mosie Lukes, MD  diclofenac Sodium (VOLTAREN) 1 % GEL diclofenac 1 % topical gel  APPLY A SMALL AMOUNT TO AFFECTED AREA FOUR TIMES A DAY AS NEEDED FOR PAIN    [provider]  famotidine (PEPCID) 40 MG tablet Take 1 tablet (40 mg total) by mouth daily. 12/11/18   Mosie Lukes, MD  fluticasone (FLONASE) 50 MCG/ACT nasal spray Place 2 sprays into both nostrils daily. 10/28/20   Mosie Lukes, MD  furosemide (LASIX) 40 MG tablet TAKE 1 TABLET BY MOUTH  TWICE DAILY 04/24/20   Mosie Lukes, MD   glucosamine-chondroitin 500-400 MG tablet Take 2 tablets by mouth 2 (two) times daily.    [provider]  hydrochlorothiazide (MICROZIDE) 12.5 MG capsule TAKE 1 CAPSULE BY MOUTH  DAILY 06/14/20   Mosie Lukes, MD  Insulin Pen Needle 32G X 6 MM MISC Use with Victoza daily 09/13/20   [name] A, DO  liraglutide (VICTOZA) 18 MG/3ML SOPN Start 0.44m SQ once a day for 7 days, then increase to 1.213monce a day 11/30/20   UkLaqueta LindenMD  meloxicam (MOBIC) 15 MG tablet TAKE 1 TABLET BY MOUTH  DAILY AS NEEDED FOR PAIN 01/11/21   BlMosie LukesMD  metFORMIN (GLUCOPHAGE) 500 MG tablet TAKE 1 TABLET BY MOUTH  TWICE DAILY WITH A MEAL 04/24/20   BlMosie LukesMD  Multiple Vitamins-Minerals (MULTIVITAMIN & MINERAL PO) Take 1 tablet by mouth daily.    [provider]  phentermine  30 MG capsule Take 1 capsule (30 mg total) by mouth every morning. 02/22/21   [name] A, DO  sertraline (ZOLOFT) 50 MG tablet Take 0.5 tablets (25 mg total) by mouth daily. 1/2 tab po daily x 7 days and then increase to 1 tab po daily 04/19/20   Mosie Lukes, MD  telmisartan (MICARDIS) 20 MG tablet TAKE 1 TABLET BY MOUTH  DAILY 12/16/20   Mosie Lukes, MD  tiZANidine (ZANAFLEX) 2 MG tablet Take 1 tablet (2 mg total) by mouth 3 (three) times daily. 01/25/21   Mosie Lukes, MD  traZODone (DESYREL) 50 MG tablet Take 0.5-1 tablets (25-50 mg total) by mouth at bedtime as needed for sleep. 10/28/20   Mosie Lukes, MD  Zinc 30 MG CAPS Take by mouth.    [provider]    Allergies    Allopurinol, Mucinex [guaifenesin er], Penicillins, Prozac [fluoxetine hcl], Amitriptyline, and Lexapro [escitalopram oxalate]  Review of Systems   Review of Systems  Gastrointestinal:  Positive for abdominal pain.  All other systems reviewed and are negative.  Physical Exam Updated Vital Signs BP (!) 137/95   Pulse 91   Temp 99.2 F (37.3 C) (Oral)   Resp 16   Ht '5\' 6"'  (1.676 m)   Wt 117.9 kg    SpO2 100%   BMI 41.97 kg/m   Physical Exam Vitals and nursing note reviewed.  Constitutional:      General: She is not in acute distress.    Appearance: She is well-developed. She is not diaphoretic.  HENT:     Head: Normocephalic and atraumatic.  Cardiovascular:     Rate and Rhythm: Normal rate and regular rhythm.     Heart sounds: No murmur heard.   No friction rub. No gallop.  Pulmonary:     Effort: Pulmonary effort is normal. No respiratory distress.     Breath sounds: Normal breath sounds. No wheezing.  Abdominal:     General: Bowel sounds are normal. There is no distension.     Palpations: Abdomen is soft.     Tenderness: There is abdominal tenderness in the left lower quadrant. There is no right CVA tenderness, left CVA tenderness, guarding or rebound.  Musculoskeletal:        General: Normal range of motion.     Cervical back: Normal range of motion and neck supple.  Skin:    General: Skin is warm and dry.  Neurological:     General: No focal deficit present.     Mental Status: She is alert and oriented to person, place, and time.    ED Results / Procedures / Treatments   Labs (all labs ordered are listed, but only abnormal results are displayed) Labs Reviewed  COMPREHENSIVE METABOLIC PANEL - Abnormal; Notable for the following components:      Result Value   Potassium 3.1 (*)    Glucose, Bld 113 (*)    Creatinine, Ser 1.19 (*)    GFR, Estimated 50 (*)    All other components within normal limits  LIPASE, BLOOD  CBC  URINALYSIS, ROUTINE W REFLEX MICROSCOPIC    EKG None  Radiology No results found.  Procedures Procedures   Medications Ordered in ED Medications  sodium chloride 0.9 % bolus 1,000 mL (has no administration in time range)    ED Course  I have reviewed the triage vital signs and the nursing notes.  Pertinent labs & imaging results that were available during my care of  the patient were reviewed by me and considered in my medical  decision making (see chart for details).    MDM Rules/Calculators/A&P  Patient presenting with left lower quadrant pain.  She has a history of diverticulitis and today's pain is a recurrence of this.  Her CT scan shows acute, uncomplicated diverticulitis which will be treated with Cipro and Flagyl and pain medication.  She is to return as needed for any problems.  Final Clinical Impression(s) / ED Diagnoses Final diagnoses:  None    Rx / DC Orders ED Discharge Orders     None        Veryl Speak, MD 02/23/21 [phone]

## 2021-02-23 ENCOUNTER — Emergency Department (HOSPITAL_BASED_OUTPATIENT_CLINIC_OR_DEPARTMENT_OTHER): Payer: Medicare Other

## 2021-02-23 ENCOUNTER — Ambulatory Visit: Payer: Medicare Other

## 2021-02-23 DIAGNOSIS — R109 Unspecified abdominal pain: Secondary | ICD-10-CM | POA: Diagnosis not present

## 2021-02-23 MED ORDER — METRONIDAZOLE 500 MG PO TABS
500.0000 mg | ORAL_TABLET | Freq: Once | ORAL | Status: AC
  Administered 2021-02-23: 500 mg via ORAL
  Filled 2021-02-23: qty 1

## 2021-02-23 MED ORDER — CIPROFLOXACIN HCL 500 MG PO TABS
500.0000 mg | ORAL_TABLET | Freq: Once | ORAL | Status: AC
  Administered 2021-02-23: 500 mg via ORAL
  Filled 2021-02-23: qty 1

## 2021-02-23 MED ORDER — CIPROFLOXACIN HCL 500 MG PO TABS: 500.0000 mg | ORAL_TABLET | Freq: Two times a day (BID) | ORAL | 0 refills | Status: DC

## 2021-02-23 MED ORDER — METRONIDAZOLE 500 MG PO TABS: 500.0000 mg | ORAL_TABLET | Freq: Three times a day (TID) | ORAL | 0 refills | Status: DC

## 2021-02-23 MED ORDER — IOHEXOL 350 MG/ML SOLN
85.0000 mL | Freq: Once | INTRAVENOUS | Status: AC | PRN
  Administered 2021-02-23: 85 mL via INTRAVENOUS

## 2021-02-23 MED ORDER — HYDROCODONE-ACETAMINOPHEN 5-325 MG PO TABS: 2.0000 | ORAL_TABLET | ORAL | 0 refills | Status: DC | PRN

## 2021-02-23 NOTE — Progress Notes (Signed)
Chief Complaint:   OBESITY [name] is here to discuss her progress with her obesity treatment plan along with follow-up of her obesity related diagnoses. [name] is on the Category 4 Plan and states she is following her eating plan approximately 70% of the time. [name] states she is doing 0 minutes 0 times per week.  Today's visit was #: 28 Starting weight: 301 lbs Starting date: 07/21/2019 Today's weight: 263 lbs Today's date: 02/22/2021 Total lbs lost to date: 38 lbs Total lbs lost since last in-office visit: 0  Interim History: [name] is up 4 lbs since her last visit. She states that she has been battling episodes of diverticulitis and has pain. She is up about 5 lbs of water per the bioimpedance scale.  Subjective:   1. Polyphagia [name] is currently taking Phentermine.  2. Other depression, with emotional eating [name] is taking her medications as directed.   Assessment/Plan:   1. Polyphagia Intensive lifestyle modifications are the first line treatment for this issue. We discussed several lifestyle modifications today and she will continue to work on diet, exercise and weight loss efforts. [name] will decrease carbohydrates and increase protein. She will increase water. She will continue Phentermine. Orders and follow up as documented in patient record.  Counseling Polyphagia is excessive hunger. Causes can include: low blood sugars, hypERthyroidism, PMS, lack of sleep, stress, insulin resistance, diabetes, certain medications, and diets that are deficient in protein and fiber.   - phentermine 30 MG capsule; Take 1 capsule (30 mg total) by mouth every morning.  Dispense: 30 capsule; Refill: 0  2. Other depression, with emotional eating Behavior modification techniques were discussed today to help [name] deal with her emotional/non-hunger eating behaviors.  We will refill Wellbutrin 150 mg for 1 month with no refills. Orders and follow up as documented in patient  record.    - buPROPion (WELLBUTRIN SR) 150 MG 12 hr tablet; Take 1 tablet (150 mg total) by mouth daily.  Dispense: 30 tablet; Refill: 0  3. Obesity with current BMI of 42.4 [name] is currently in the action stage of change. As such, her goal is to continue with weight loss efforts. She has agreed to the Category 4 Plan.   [name] will continue meal planning. She will adhere to here plan. We will refill Phentermine 30 mg with lunch for 1 month with no refills. She will increase Phentermine 15 mg. She will go to [name].  Exercise goals: No exercise has been prescribed at this time.  Behavioral modification strategies: increasing lean protein intake, decreasing simple carbohydrates, increasing vegetables, increasing water intake, decreasing eating out, no skipping meals, meal planning and cooking strategies, keeping healthy foods in the home, and planning for success.  [name] has agreed to follow-up with our clinic in 3-4 weeks. She was informed of the importance of frequent follow-up visits to maximize her success with intensive lifestyle modifications for her multiple health conditions.   Objective:   Blood pressure 132/90, pulse 91, temperature 98.5 F (36.9 C), height 5\' 6"  (1.676 m), weight 263 lb (119.3 kg), SpO2 97 %. Body mass index is 42.45 kg/m.  General: Cooperative, alert, well developed, in no acute distress. HEENT: Conjunctivae and lids unremarkable. Cardiovascular: Regular rhythm.  Lungs: Normal work of breathing. Neurologic: No focal deficits.   Lab Results  Component Value Date   CREATININE 1.19 (H) 02/22/2021   BUN 17 02/22/2021   NA 137 02/22/2021   K 3.1 (L) 02/22/2021   CL 98 02/22/2021  CO2 31 02/22/2021   Lab Results  Component Value Date   ALT 16 02/22/2021   AST 15 02/22/2021   ALKPHOS 69 02/22/2021   BILITOT 0.3 02/22/2021   Lab Results  Component Value Date   HGBA1C 6.3 10/28/2020   HGBA1C 6.1 (H) 07/20/2020   HGBA1C 6.2 (H) 04/05/2020    HGBA1C 6.0 (H) 11/13/2019   HGBA1C 6.4 (H) 07/21/2019   Lab Results  Component Value Date   INSULIN 17.8 07/20/2020   INSULIN 19.2 04/05/2020   INSULIN 12.9 11/13/2019   INSULIN 35.9 (H) 07/21/2019   Lab Results  Component Value Date   TSH 0.82 10/28/2020   Lab Results  Component Value Date   CHOL 159 10/28/2020   HDL 51.40 10/28/2020   LDLCALC 76 10/28/2020   LDLDIRECT 110.0 08/21/2016   TRIG 156.0 (H) 10/28/2020   CHOLHDL 3 10/28/2020   Lab Results  Component Value Date   VD25OH 53.9 07/20/2020   VD25OH 60.4 04/05/2020   VD25OH 53.3 11/13/2019   Lab Results  Component Value Date   WBC 9.0 02/22/2021   HGB 12.6 02/22/2021   HCT 38.6 02/22/2021   MCV 87.5 02/22/2021   PLT 227 02/22/2021   No results found for: IRON, TIBC, FERRITIN  Obesity Behavioral Intervention:   Approximately 15 minutes were spent on the discussion below.  ASK: We discussed the diagnosis of obesity with Dixie today and Caliah agreed to give [name] permission to discuss obesity behavioral modification therapy today.  ASSESS: Keyandra has the diagnosis of obesity and her BMI today is 42.4. Estelene is in the action stage of change.   ADVISE: Dotsie was educated on the multiple health risks of obesity as well as the benefit of weight loss to improve her health. She was advised of the need for long term treatment and the importance of lifestyle modifications to improve her current health and to decrease her risk of future health problems.  AGREE: Multiple dietary modification options and treatment options were discussed and Zophia agreed to follow the recommendations documented in the above note.  ARRANGE: Hailea was educated on the importance of frequent visits to treat obesity as outlined per CMS and USPSTF guidelines and agreed to schedule her next follow up appointment today.  Attestation Statements:   Reviewed by clinician on day of visit: allergies, medications, problem list, medical  history, surgical history, family history, social history, and previous encounter notes.   I, Lizbeth Bark, RMA, am acting as Location manager for [employer], DO.   I have reviewed the above documentation for accuracy and completeness, and I agree with the above. [name], DO

## 2021-02-23 NOTE — Discharge Instructions (Addendum)
Begin taking Cipro and Flagyl as prescribed.  Begin taking hydrocodone as prescribed as needed for pain.  Follow-up with primary doctor if not improving, and return to the ER if symptoms significantly worsen or change.

## 2021-02-24 DIAGNOSIS — F32A Depression, unspecified: Secondary | ICD-10-CM | POA: Diagnosis not present

## 2021-02-24 DIAGNOSIS — I10 Essential (primary) hypertension: Secondary | ICD-10-CM | POA: Diagnosis not present

## 2021-02-24 DIAGNOSIS — G4733 Obstructive sleep apnea (adult) (pediatric): Secondary | ICD-10-CM | POA: Diagnosis not present

## 2021-03-10 ENCOUNTER — Other Ambulatory Visit (INDEPENDENT_AMBULATORY_CARE_PROVIDER_SITE_OTHER): Payer: Self-pay | Admitting: Bariatrics

## 2021-03-10 DIAGNOSIS — R632 Polyphagia: Secondary | ICD-10-CM

## 2021-03-13 ENCOUNTER — Encounter: Payer: Self-pay | Admitting: Family Medicine

## 2021-03-13 DIAGNOSIS — I10 Essential (primary) hypertension: Secondary | ICD-10-CM

## 2021-03-14 MED ORDER — FUROSEMIDE 40 MG PO TABS: 40.0000 mg | ORAL_TABLET | Freq: Two times a day (BID) | ORAL | 0 refills | Status: DC

## 2021-03-14 NOTE — Telephone Encounter (Signed)
Last seen by Dr. Brown. 

## 2021-03-16 ENCOUNTER — Ambulatory Visit (INDEPENDENT_AMBULATORY_CARE_PROVIDER_SITE_OTHER): Payer: Medicare Other | Admitting: Bariatrics

## 2021-03-18 ENCOUNTER — Other Ambulatory Visit: Payer: Self-pay | Admitting: Family Medicine

## 2021-03-26 DIAGNOSIS — F32A Depression, unspecified: Secondary | ICD-10-CM | POA: Diagnosis not present

## 2021-03-26 DIAGNOSIS — I10 Essential (primary) hypertension: Secondary | ICD-10-CM | POA: Diagnosis not present

## 2021-03-26 DIAGNOSIS — G4733 Obstructive sleep apnea (adult) (pediatric): Secondary | ICD-10-CM | POA: Diagnosis not present

## 2021-04-04 ENCOUNTER — Telehealth (INDEPENDENT_AMBULATORY_CARE_PROVIDER_SITE_OTHER): Payer: Medicare Other | Admitting: Medical

## 2021-04-04 ENCOUNTER — Encounter: Payer: Self-pay | Admitting: Medical

## 2021-04-04 VITALS — BP 126/76 | HR 90

## 2021-04-04 DIAGNOSIS — J029 Acute pharyngitis, unspecified: Secondary | ICD-10-CM | POA: Diagnosis not present

## 2021-04-04 DIAGNOSIS — R051 Acute cough: Secondary | ICD-10-CM | POA: Diagnosis not present

## 2021-04-04 DIAGNOSIS — J0110 Acute frontal sinusitis, unspecified: Secondary | ICD-10-CM

## 2021-04-04 DIAGNOSIS — R0981 Nasal congestion: Secondary | ICD-10-CM

## 2021-04-04 DIAGNOSIS — J3489 Other specified disorders of nose and nasal sinuses: Secondary | ICD-10-CM

## 2021-04-04 MED ORDER — HYDROCODONE BIT-HOMATROP MBR 5-1.5 MG/5ML PO SOLN: 5.0000 mL | Freq: Four times a day (QID) | ORAL | 0 refills | Status: DC | PRN

## 2021-04-04 MED ORDER — DOXYCYCLINE HYCLATE 100 MG PO TABS: 100.0000 mg | ORAL_TABLET | Freq: Two times a day (BID) | ORAL | 0 refills | Status: DC

## 2021-04-04 NOTE — Telephone Encounter (Signed)
BP updated to mychart visit

## 2021-04-04 NOTE — Patient Instructions (Signed)
Recent sinus pressure, sore throat and significant cough.  Will treat for sinus infection and possible strep throat.  Cough moderate to severe despite using Delsym and patient notes benzonatate is never helped for her cough.  COVID test was negative at home.  Prescribing doxycycline oral antibiotic, Hycodan cough syrup and advised patient to continue Flonase.  If symptoms persist despite above advised to repeat rapid COVID test tomorrow or Wednesday morning.  Advised patient if positive within 5 days of her symptom onset and we could give antiviral medication.  Patient expresses understanding.  Also explained to patient that with a negative COVID test this visit could have been in person rather than by video.  Follow-up in 7 to 10 days or sooner if needed.

## 2021-04-04 NOTE — Progress Notes (Signed)
   Subjective:    Patient ID: [name], female    DOB: 1956/04/10, 65 y.o.   MRN: [id number]  HPI Virtual Visit via Video Note  I connected with [name] on 04/04/21 at  9:40 AM EST by a video enabled telemedicine application and verified that I am speaking with the correct person using two identifiers.  Location: Patient: home Provider: office   I discussed the limitations of evaluation and management by telemedicine and the availability of in person appointments. The patient expressed understanding and agreed to proceed.  History of Present Illness:  Saturday she got runny nose. Yesterday felt fatigued and started to cough. No fever, no chills or sweats. She states coughing a lot. No shortness of breath. Pt feels nasal congested. Some frontal and maxillary sinus pressure. Pt took covid test yesterday and was negative. Pt has been using delsym and flonase. She is able to sleep.   Pt states benzonatate has never helped her stop.  Pt has not checked bp, pulse or 02 sat.   Observations/Objective: General-no acute distress, pleasant, oriented. Lungs- on inspection lungs appear unlabored. Neck- no tracheal deviation or jvd on inspection. Neuro- gross motor function appears intact.  Heent- sounds nasal congested and on self palpation maxillary and frontal sinus pressure.    Assessment and Plan:  Patient Instructions  Recent sinus pressure, sore throat and significant cough.  Will treat for sinus infection and possible strep throat.  Cough moderate to severe despite using Delsym and patient notes benzonatate is never helped for her cough.  COVID test was negative at home.  Prescribing doxycycline oral antibiotic, Hycodan cough syrup and advised patient to continue Flonase.  If symptoms persist despite above advised to repeat rapid COVID test tomorrow or Wednesday morning.  Advised patient if positive within 5 days of her symptom onset and we could give  antiviral medication.  Patient expresses understanding.  Also explained to patient that with a negative COVID test this visit could have been in person rather than by video.  Follow-up in 7 to 10 days or sooner if needed.   Time spent with patient today was 16  minutes which consisted of chart revdiew, discussing diagnosis, work up treatment and documentation.  Follow Up Instructions:    I discussed the assessment and treatment plan with the patient. The patient was provided an opportunity to ask questions and all were answered. The patient agreed with the plan and demonstrated an understanding of the instructions.   The patient was advised to call back or seek an in-person evaluation if the symptoms worsen or if the condition fails to improve as anticipated.     Mackie Pai, PA-C    Review of Systems     Objective:   Physical Exam        Assessment & Plan:

## 2021-04-05 ENCOUNTER — Encounter: Payer: Self-pay | Admitting: Medical

## 2021-04-05 MED ORDER — ALBUTEROL SULFATE HFA 108 (90 BASE) MCG/ACT IN AERS: 2.0000 | INHALATION_SPRAY | Freq: Four times a day (QID) | RESPIRATORY_TRACT | 0 refills | Status: DC | PRN

## 2021-04-05 NOTE — Addendum Note (Signed)
Addended by: Anabel Halon on: 04/05/2021 11:34 AM   Modules accepted: Orders

## 2021-04-07 ENCOUNTER — Telehealth: Payer: Self-pay | Admitting: Medical

## 2021-04-07 ENCOUNTER — Telehealth: Payer: Self-pay | Admitting: Family Medicine

## 2021-04-07 MED ORDER — BUDESONIDE-FORMOTEROL FUMARATE 80-4.5 MCG/ACT IN AERO: 2.0000 | INHALATION_SPRAY | Freq: Two times a day (BID) | RESPIRATORY_TRACT | 12 refills | Status: DC

## 2021-04-07 NOTE — Telephone Encounter (Signed)
Provider and pt talking via [employer]

## 2021-04-07 NOTE — Telephone Encounter (Signed)
Please advise 

## 2021-04-07 NOTE — Telephone Encounter (Signed)
Pt. Called in and stated she is not getting any better. She still feels the same and wants to know is there anything else she can do? Does she need another visit or does she need to wait it out.

## 2021-04-07 NOTE — Telephone Encounter (Signed)
Rx symbicort sent to pt pharmacy.

## 2021-04-08 ENCOUNTER — Ambulatory Visit (INDEPENDENT_AMBULATORY_CARE_PROVIDER_SITE_OTHER): Payer: Medicare Other | Admitting: Medical

## 2021-04-08 ENCOUNTER — Other Ambulatory Visit: Payer: Self-pay

## 2021-04-08 VITALS — BP 121/68 | HR 74 | Temp 98.6°F | Resp 18 | Ht 66.0 in | Wt 262.0 lb

## 2021-04-08 DIAGNOSIS — R062 Wheezing: Secondary | ICD-10-CM

## 2021-04-08 DIAGNOSIS — J0110 Acute frontal sinusitis, unspecified: Secondary | ICD-10-CM | POA: Diagnosis not present

## 2021-04-08 DIAGNOSIS — R739 Hyperglycemia, unspecified: Secondary | ICD-10-CM | POA: Diagnosis not present

## 2021-04-08 LAB — COMPREHENSIVE METABOLIC PANEL
ALT: 14 U/L (ref 0–35)
AST: 16 U/L (ref 0–37)
Albumin: 4.2 g/dL (ref 3.5–5.2)
Alkaline Phosphatase: 61 U/L (ref 39–117)
BUN: 23 mg/dL (ref 6–23)
CO2: 31 mEq/L (ref 19–32)
Calcium: 9.3 mg/dL (ref 8.4–10.5)
Chloride: 102 mEq/L (ref 96–112)
Creatinine, Ser: 0.94 mg/dL (ref 0.40–1.20)
GFR: 63.14 mL/min (ref >60.00)
Glucose, Bld: 117 mg/dL — ABNORMAL HIGH (ref 70–99)
Potassium: 4.3 mEq/L (ref 3.5–5.1)
Sodium: 140 mEq/L (ref 135–145)
Total Bilirubin: 0.6 mg/dL (ref 0.2–1.2)
Total Protein: 6.8 g/dL (ref 6.0–8.3)

## 2021-04-08 LAB — POCT CBG (FASTING - GLUCOSE)-MANUAL ENTRY: Glucose Fasting, POC: 117 mg/dL — AB (ref 70–99)

## 2021-04-08 LAB — HEMOGLOBIN A1C: Hgb A1c MFr Bld: 6.6 % — ABNORMAL HIGH (ref 4.6–6.5)

## 2021-04-08 MED ORDER — PREDNISONE 10 MG (21) PO TBPK: ORAL_TABLET | ORAL | 0 refills | Status: DC

## 2021-04-08 NOTE — Progress Notes (Signed)
Subjective:    Patient ID: [name], female    DOB: 14-May-1956, 65 y.o.   MRN: [id number]  HPI  Pt seen last time 4 days ago by video visit.   Plan from that visit below in ".  "Recent sinus pressure, sore throat and significant cough.  Will treat for sinus infection and possible strep throat.  Cough moderate to severe despite using Delsym and patient notes benzonatate is never helped for her cough.  COVID test was negative at home.   Prescribing doxycycline oral antibiotic, Hycodan cough syrup and advised patient to continue Flonase.   If symptoms persist despite above advised to repeat rapid COVID test tomorrow or Wednesday morning.  Advised patient if positive within 5 days of her symptom onset and we could give antiviral medication.  Patient expresses understanding.  Also explained to patient that with a negative COVID test this visit could have been in person rather than by video."   Pt sent me various message by my chart and recommended in person visit reported worse sinus pressure despite treatment and indicated worse wheezing. This is despite doxy antibiotic and use of albuterol. She had indicated may need predisone. Pt is diabetic per chart review. Dx by healthy weght loss and wellness. Lat a1c was 6.3. highest a1c  was 3 years ago at 6.6. On metformon and vicotza.  Htn- on micardis 20 mg daily. Pt retested twice for covid and both test negative.  Pt has been using netty pot.  Pt tells me that she has less tenderness to her sinus but still has nasal congestion .  Pt stats pharmacy did not have symbicort available.   Review of Systems  Constitutional:  Negative for chills, fatigue and fever.  HENT:  Positive for congestion, sinus pressure and sinus pain.   Respiratory:  Positive for choking and wheezing. Negative for shortness of breath.   Cardiovascular:  Negative for chest pain and palpitations.  Gastrointestinal:  Negative for abdominal pain.  Skin:   Negative for rash.  Neurological:  Negative for dizziness, seizures, syncope, weakness and light-headedness.  Hematological:  Negative for adenopathy. Does not bruise/bleed easily.  Psychiatric/Behavioral:  Negative for behavioral problems and confusion.      Past Medical History:  Diagnosis Date   Allergic rhinitis    Allergy    Anemia 07/17/2016   Anxiety    Asthma    seasonal   Blood transfusion without reported diagnosis    Breast cancer ([place])    right   Bronchitis    Colon polyps    CPAP (continuous positive airway pressure) dependence    Depression    Diabetes mellitus    borderline but takes metformin   Edema    Family history of breast cancer in first degree relative    Fibromyalgia    GERD (gastroesophageal reflux disease)    Hyperglycemia 03/05/2017   Hyperlipidemia 08/21/2016   no meds   Hyperparathyroidism    Hypertension    controlled by medications   Joint pain    Neuromuscular disorder ([place])    Fibromyalgia   Obesity    Osteoarthritis    RA   PONV (postoperative nausea and vomiting)    occasional   Pre-diabetes    Sleep apnea    wears cpap   Viral meningitis    Vitamin D deficiency      Social History   Socioeconomic History   Marital status: Widowed    Spouse name: Not on file   Number  of children: 1   Years of education: Not on file   Highest education level: 12th grade  Occupational History   Occupation: ACCOUNTING    Employer: [employer]  Tobacco Use   Smoking status: Never   Smokeless tobacco: Never  Vaping Use   Vaping Use: Never used  Substance and Sexual Activity   Alcohol use: Not Currently   Drug use: No   Sexual activity: Not on file  Other Topics Concern   Not on file  Social History Narrative   Not on file   Social Determinants of Health   Financial Resource Strain: Not on file  Food Insecurity: Not on file  Transportation Needs: Not on file  Physical Activity: Not on file  Stress: Not on file  Social  Connections: Not on file  Intimate Partner Violence: Not on file    Past Surgical History:  Procedure Laterality Date   [place] EXCISIONAL BIOPSY Left 1993   benign   [place] flap due to breast cancer   [place]   COLONOSCOPY  08/14/2011   HAND SURGERY     dog bite, right hand   HAND SURGERY     trauma, left hand   KNEE ARTHROSCOPY     bilateral   left knee replacement  2010   MASTECTOMY  01/13/94   Right breast   parathyroid resection     PARATHYROIDECTOMY     POLYPECTOMY     rectal abscess     SEPTOPLASTY  1980   SHOULDER ARTHROSCOPY Right 11/09/2015   Procedure: ARTHROSCOPY SHOULDER-acromioplasty, distal clavicle resection and debridement;  Surgeon: Melrose Nakayama, MD;  Location: [facility];  Service: Orthopedics;  Laterality: Right;   shoulder arthroscopy     rotator cuff repair   TOE SURGERY Right    paronychia and adenoma removed   TONSILLECTOMY     TOTAL KNEE ARTHROPLASTY  06/18/08   [facility]   TUMOR REMOVAL  1982   , scalp    Family History  Problem Relation Age of Onset   Emphysema Father    Lung cancer Father        lung ca dx 18   Other Father        prostate issues   Diabetes Father    Breast cancer Maternal Aunt        dx late 30s   Colon cancer Maternal Aunt        dx 48s   Colon polyps Maternal Aunt    Prostate cancer Maternal Grandfather 48       d. 85y   Heart disease Paternal Grandfather    Heart attack Paternal Grandfather        d. 50y   Diabetes Paternal Grandfather    Asthma Daughter        "seasonal"   Arthritis Maternal Grandmother    Aneurysm Maternal Grandmother        d. brain aneurysm at 103   Arthritis Mother    Colon polyps Mother    Hypertension Mother    Sleep apnea Mother    Multiple sclerosis Sister    Breast cancer Sister 73       L IDC and DCIS; ER/PR+, Her2-   Fibrocystic breast disease Sister    Arthritis/Rheumatoid Sister    Breast cancer  Sister    Cancer Maternal Uncle        d.  mouth cancer at younger age; smoker   Other Paternal Uncle        muscle issues - couldn't walk or talk; d. 20y   Cancer Maternal Aunt        lymphoma, dx 34s   Ovarian cancer Maternal Aunt        dx 55s; d. late 57s   Lung cancer Maternal Uncle        d. 71y; former smoker   Throat cancer Cousin        maternal 1st cousin; smoker   Breast cancer Cousin 101       maternal 1st cousin   Other Paternal Uncle        prostate issues   Breast cancer Cousin        paternal 1st cousin dx late 70s   Throat cancer Cousin        maternal 1st cousin; used SL tobacco   Prostate cancer Cousin        maternal 1st cousin dx 17s   Esophageal cancer Neg Hx    Rectal cancer Neg Hx    Stomach cancer Neg Hx     Allergies  Allergen Reactions   Allopurinol Hives   Mucinex [Guaifenesin Er] Shortness Of Breath   Penicillins Hives and Other (See Comments)    Has patient had a PCN reaction causing immediate rash, facial/tongue/throat swelling, SOB or lightheadedness with hypotension: no Has patient had a PCN reaction causing severe rash involving mucus membranes or skin necrosis: no Has patient had a PCN reaction that required hospitalization no Has patient had a PCN reaction occurring within the last 10 years: no If all of the above answers are "NO", then may proceed with Cephalosporin use.    Prozac [Fluoxetine Hcl] Hives   Amitriptyline Other (See Comments)    "felt weird, fatigue, dizziness"   Lexapro [Escitalopram Oxalate]     Nausea and hypersalivation.     Current Outpatient Medications on File Prior to Visit  Medication Sig Dispense Refill   albuterol (VENTOLIN HFA) 108 (90 Base) MCG/ACT inhaler Inhale 2 puffs into the lungs every 6 (six) hours as needed. 18 g 0   atorvastatin (LIPITOR) 10 MG tablet Take 1 tablet (10 mg total) by mouth 2 (two) times a week. 10 tablet 3   budesonide-formoterol (SYMBICORT) 80-4.5 MCG/ACT inhaler Inhale 2 puffs  into the lungs 2 (two) times daily. 1 each 12   buPROPion (WELLBUTRIN SR) 150 MG 12 hr tablet Take 1 tablet (150 mg total) by mouth daily. 30 tablet 0   calcium carbonate (OSCAL) 1500 (600 Ca) MG TABS tablet Take 1,500 mg by mouth daily.     ciprofloxacin (CIPRO) 500 MG tablet Take 1 tablet (500 mg total) by mouth 2 (two) times daily. One po bid x 7 days 14 tablet 0   cyclobenzaprine (FLEXERIL) 5 MG tablet Take 1 tablet (5 mg total) by mouth at bedtime as needed for muscle spasms. 15 tablet 0   diclofenac Sodium (VOLTAREN) 1 % GEL diclofenac 1 % topical gel  APPLY A SMALL AMOUNT TO AFFECTED AREA FOUR TIMES A DAY AS NEEDED FOR PAIN     doxycycline (VIBRA-TABS) 100 MG tablet Take 1 tablet (100 mg total) by mouth 2 (two) times daily. 20 tablet 0   famotidine (PEPCID) 40 MG tablet Take 1 tablet (40 mg total) by mouth daily. 30 tablet 5   fluticasone (FLONASE) 50 MCG/ACT nasal spray Place 2 sprays into both nostrils daily. 16 g 5  furosemide (LASIX) 40 MG tablet Take 1 tablet (40 mg total) by mouth 2 (two) times daily. 180 tablet 0   glucosamine-chondroitin 500-400 MG tablet Take 2 tablets by mouth 2 (two) times daily.     hydrochlorothiazide (MICROZIDE) 12.5 MG capsule TAKE 1 CAPSULE BY MOUTH  DAILY 90 capsule 3   HYDROcodone bit-homatropine (HYCODAN) 5-1.5 MG/5ML syrup Take 5 mLs by mouth every 6 (six) hours as needed for cough. 120 mL 0   Insulin Pen Needle 32G X 6 MM MISC Use with Victoza daily 50 each 0   liraglutide (VICTOZA) 18 MG/3ML SOPN Start 0.27m SQ once a day for 7 days, then increase to 1.222monce a day 6 mL 0   meloxicam (MOBIC) 15 MG tablet TAKE 1 TABLET BY MOUTH  DAILY AS NEEDED FOR PAIN 90 tablet 3   metFORMIN (GLUCOPHAGE) 500 MG tablet TAKE 1 TABLET BY MOUTH  TWICE DAILY WITH A MEAL 180 tablet 3   metroNIDAZOLE (FLAGYL) 500 MG tablet Take 1 tablet (500 mg total) by mouth 3 (three) times daily. One po tid x 7 days 21 tablet 0   Multiple Vitamins-Minerals (MULTIVITAMIN & MINERAL PO)  Take 1 tablet by mouth daily.     phentermine 30 MG capsule Take 1 capsule (30 mg total) by mouth every morning. 30 capsule 0   sertraline (ZOLOFT) 50 MG tablet Take 0.5 tablets (25 mg total) by mouth 2 (two) times daily. 90 tablet 1   telmisartan (MICARDIS) 20 MG tablet TAKE 1 TABLET BY MOUTH  DAILY 90 tablet 1   tiZANidine (ZANAFLEX) 2 MG tablet Take 1 tablet (2 mg total) by mouth 3 (three) times daily. 90 tablet 1   traZODone (DESYREL) 50 MG tablet Take 0.5-1 tablets (25-50 mg total) by mouth at bedtime as needed for sleep. 30 tablet 3   Zinc 30 MG CAPS Take by mouth.     No current facility-administered medications on file prior to visit.    BP 121/68   Pulse 74   Temp 98.6 F (37 C)   Resp 18   Ht _0  (1.676 m)   Wt 262 lb (118.8 kg)   SpO2 95%   BMI 42.29 kg/m          Objective:   Physical Exam General- No acute distress. Pleasant patient. She sounds more nasal congested. Some coughing during the interview. Neck- Full range of motion, no jvd Lungs- Clear, even and unlabored. Heart- regular rate and rhythm. Neurologic- CNII- XII grossly intact.  Lower ext- calves symmetric and no pedal edema. Negative homans signs. Heent- frontal and maxillary mild sinus pressure.      Assessment & Plan:   Patient Instructions  Sinus infection with slow incremental improvement.  Continue doxycycline.  Presently would not switch antibiotic.  However if your sinuses are still hurting by Monday please let me know.  In that event would consider switching to clindamycin or levofloxacin.  Persisting moderate to severe nasal congestion and wheezing.  Symbicort inhaler was not available at her pharmacy.  Prescribing 6-day taper dose of prednisone.  Use albuterol as needed.  A1c in prediabetic range over the past 3 years.  However 1 time 3 years ago A1c was 6.6.  Continue low sugar diet and asked to get a glucometer over-the-counter/[employer] brand would probably be reasonable price.  Check  blood sugars daily while on the prednisone.  If any sugars over 200 let usKoreanow.  Will get CMP and A1c today.  Presently until you are  done with tapered dose of prednisone recommend increasing metformin to twice daily.  Blood pressure well controlled today.  Continue current BP medication.  For cough try to fill the Hycodan sent to your pharmacy.  Asked them why it is so expensive?Marland Kitchen  They have a cheaper hydrocodone-based alternative?  Follow-up date to be determined after MyChart update on Monday.   Mackie Pai, PA-C   Time spent with patient today was  30 minutes which consisted of chart review, discussing diagnosis, work up, treatment and documentation.

## 2021-04-08 NOTE — Patient Instructions (Signed)
Sinus infection with slow incremental improvement.  Continue doxycycline.  Presently would not switch antibiotic.  However if your sinuses are still hurting by Monday please let me know.  In that event would consider switching to clindamycin or levofloxacin.  Persisting moderate to severe nasal congestion and wheezing.  Symbicort inhaler was not available at her pharmacy.  Prescribing 6-day taper dose of prednisone.  Use albuterol as needed.  A1c in prediabetic range over the past 3 years.  However 1 time 3 years ago A1c was 6.6.  Continue low sugar diet and asked to get a glucometer over-the-counter/Relion brand would probably be reasonable price.  Check blood sugars daily while on the prednisone.  If any sugars over 200 let us know.  Will get CMP and A1c today.  Presently until you are done with tapered dose of prednisone recommend increasing metformin to twice daily.  Blood pressure well controlled today.  Continue current BP medication.  For cough try to fill the Hycodan sent to your pharmacy.  Asked them why it is so expensive?Marland Kitchen  They have a cheaper hydrocodone-based alternative?  Follow-up date to be determined after MyChart update on Monday.

## 2021-04-11 DIAGNOSIS — I10 Essential (primary) hypertension: Secondary | ICD-10-CM | POA: Diagnosis not present

## 2021-04-11 DIAGNOSIS — G4733 Obstructive sleep apnea (adult) (pediatric): Secondary | ICD-10-CM | POA: Diagnosis not present

## 2021-04-13 ENCOUNTER — Encounter: Payer: Self-pay | Admitting: Pulmonary Disease

## 2021-04-13 ENCOUNTER — Other Ambulatory Visit: Payer: Self-pay

## 2021-04-13 ENCOUNTER — Ambulatory Visit: Payer: Medicare Other | Admitting: Pulmonary Disease

## 2021-04-13 VITALS — BP 110/78 | HR 78 | Temp 98.2°F | Ht 66.0 in | Wt 263.0 lb

## 2021-04-13 DIAGNOSIS — G4733 Obstructive sleep apnea (adult) (pediatric): Secondary | ICD-10-CM

## 2021-04-13 NOTE — Progress Notes (Signed)
Getting the  Subjective:    Patient ID: [name], female    DOB: 02-17-1956, 65 y.o.   MRN: [id number]  HPI 65 yo for FU of OSA   C/o cough last visit, CXR appeared prominent interstitium,HRCT was nml  PMH -hypertension, diabetes, "asthma" but she reports using albuterol MDI only for episodes of bronchitis Covid infection 02/2020  After last OV we sent in order to [facility] for CPAP 8 cm, but after a few months CPAP, DME was changed to [facility] and she has now obtained a new machine.  She wonders if she can have backup battery for when she loses power because she just cannot sleep without her machine.  Even if she falls asleep in the daytime she feels like her airway is closing up. She settled down with a small nasal pillows mask. Her husband passed away earlier in the year and [place]  She had a recent episode of bronchitis for which she was given doxycycline and prednisone and she is recovering well  Significant tests/ events reviewed  12/2019 HST HST AHI 15/hour, worse in supine position 11/2019 HRCT >> no ILD 04/2012 NPSG >> mod OSA, 15/h with nadir desatn 71% corre ected by CPAP 8cm, small full face mask  Review of Systems neg for any significant sore throat, dysphagia, itching, sneezing, nasal congestion or excess/ purulent secretions, fever, chills, sweats, unintended wt loss, pleuritic or exertional cp, hempoptysis, orthopnea pnd or change in chronic leg swelling. Also denies presyncope, palpitations, heartburn, abdominal pain, nausea, vomiting, diarrhea or change in bowel or urinary habits, dysuria,hematuria, rash, arthralgias, visual complaints, headache, numbness weakness or ataxia.     Objective:   Physical Exam  Gen. Pleasant, obese, in no distress ENT - no lesions, no post nasal drip Neck: No JVD, no thyromegaly, no carotid bruits Lungs: no use of accessory muscles, no dullness to percussion, decreased without rales or  rhonchi  Cardiovascular: Rhythm regular, heart sounds  normal, no murmurs or gallops, no peripheral edema Musculoskeletal: No deformities, no cyanosis or clubbing , no tremors       Assessment & Plan:    Acute bronchitis -appears to be resolving after course of Z-Pak and prednisone does not seem to need more medications

## 2021-04-13 NOTE — Assessment & Plan Note (Signed)
CPAP is set at 8 cm and she is compliant by history, she has been on CPAP for many years and CPAP is only helped him for daytime somnolence and fatigue We will check download on her machine to objectively confirm compliance and ensure that we do not have to tweak settings.  I explained to her how to get the battery pack for CPAP CPAP supplies will be renewed for a year Weight loss encouraged, compliance with goal of at least 4-6 hrs every night is the expectation. Advised against medications with sedative side effects Cautioned against driving when sleepy - understanding that sleepiness will vary on a day to day basis

## 2021-04-13 NOTE — Patient Instructions (Signed)
CPAP supplies including small nasal pillows will be renewed CPAP download

## 2021-04-26 DIAGNOSIS — G4733 Obstructive sleep apnea (adult) (pediatric): Secondary | ICD-10-CM | POA: Diagnosis not present

## 2021-04-26 DIAGNOSIS — F32A Depression, unspecified: Secondary | ICD-10-CM | POA: Diagnosis not present

## 2021-04-26 DIAGNOSIS — I10 Essential (primary) hypertension: Secondary | ICD-10-CM | POA: Diagnosis not present

## 2021-05-02 ENCOUNTER — Encounter: Payer: Self-pay | Admitting: Family Medicine

## 2021-05-02 ENCOUNTER — Other Ambulatory Visit: Payer: Self-pay | Admitting: Family Medicine

## 2021-05-02 ENCOUNTER — Ambulatory Visit (INDEPENDENT_AMBULATORY_CARE_PROVIDER_SITE_OTHER): Payer: Medicare Other | Admitting: Family Medicine

## 2021-05-02 VITALS — BP 114/68 | HR 76 | Temp 98.5°F | Resp 16 | Wt 268.6 lb

## 2021-05-02 DIAGNOSIS — F3289 Other specified depressive episodes: Secondary | ICD-10-CM

## 2021-05-02 DIAGNOSIS — M542 Cervicalgia: Secondary | ICD-10-CM

## 2021-05-02 DIAGNOSIS — G4733 Obstructive sleep apnea (adult) (pediatric): Secondary | ICD-10-CM | POA: Diagnosis not present

## 2021-05-02 DIAGNOSIS — J0110 Acute frontal sinusitis, unspecified: Secondary | ICD-10-CM | POA: Diagnosis not present

## 2021-05-02 DIAGNOSIS — I10 Essential (primary) hypertension: Secondary | ICD-10-CM

## 2021-05-02 DIAGNOSIS — E1140 Type 2 diabetes mellitus with diabetic neuropathy, unspecified: Secondary | ICD-10-CM | POA: Diagnosis not present

## 2021-05-02 DIAGNOSIS — E559 Vitamin D deficiency, unspecified: Secondary | ICD-10-CM | POA: Diagnosis not present

## 2021-05-02 HISTORY — DX: Cervicalgia: M54.2

## 2021-05-02 MED ORDER — SULFAMETHOXAZOLE-TRIMETHOPRIM 800-160 MG PO TABS: 1.0000 | ORAL_TABLET | Freq: Two times a day (BID) | ORAL | 0 refills | Status: DC

## 2021-05-02 MED ORDER — OZEMPIC (0.25 OR 0.5 MG/DOSE) 2 MG/1.5ML ~~LOC~~ SOPN: 0.2500 mg | PEN_INJECTOR | SUBCUTANEOUS | 0 refills | Status: DC

## 2021-05-02 MED ORDER — MONTELUKAST SODIUM 10 MG PO TABS: 10.0000 mg | ORAL_TABLET | Freq: Every day | ORAL | 1 refills | Status: DC

## 2021-05-02 MED ORDER — SAXENDA 18 MG/3ML ~~LOC~~ SOPN: PEN_INJECTOR | SUBCUTANEOUS | 0 refills | Status: DC

## 2021-05-02 MED ORDER — LEVOCETIRIZINE DIHYDROCHLORIDE 5 MG PO TABS: 5.0000 mg | ORAL_TABLET | Freq: Every evening | ORAL | 1 refills | Status: DC

## 2021-05-02 MED ORDER — METHYLPREDNISOLONE 4 MG PO TABS: ORAL_TABLET | ORAL | 0 refills | Status: DC

## 2021-05-02 MED ORDER — BUPROPION HCL ER (SR) 150 MG PO TB12: 150.0000 mg | ORAL_TABLET | Freq: Every day | ORAL | 1 refills | Status: DC

## 2021-05-02 MED ORDER — SAXENDA 18 MG/3ML ~~LOC~~ SOPN: 3.0000 mg | PEN_INJECTOR | Freq: Every day | SUBCUTANEOUS | 2 refills | Status: DC

## 2021-05-02 NOTE — Assessment & Plan Note (Signed)
Encouraged moist heat and gentle stretching as tolerated. May try NSAIDs and prescription meds as directed and report if symptoms worsen or seek immediate care. Referred to Sports medicine for further evaluation.

## 2021-05-02 NOTE — Assessment & Plan Note (Signed)
Supplement and monitor 

## 2021-05-02 NOTE — Assessment & Plan Note (Signed)
Well controlled, no changes to meds. Encouraged heart healthy diet such as the DASH diet and exercise as tolerated.  °

## 2021-05-02 NOTE — Assessment & Plan Note (Signed)
Recently seen by Dr Elsworth Soho of pulmonogy for continuation of her CPAP. No changes

## 2021-05-02 NOTE — Assessment & Plan Note (Signed)
hgba1c acceptable, minimize simple carbs. Increase exercise as tolerated. Continue current meds but drop the Metformin to once a day which she tolerated better and add Ozempic at 0.25 mg weekly and then increase to 0.5 mg next month and up from there every 4 weeks as needed and as tolerated

## 2021-05-02 NOTE — Patient Instructions (Signed)

## 2021-05-02 NOTE — Progress Notes (Signed)
Subjective:   By signing my name below, I, [name], attest that this documentation has been prepared under the direction and in the presence of Holly Lukes, MD. 05/02/2021   Patient ID: [name], female    DOB: 1956/01/15, 65 y.o.   MRN: [id number]  Chief Complaint  Patient presents with   6 months follow up    HPI Patient is in today for an office visit and 6 month f/u.  She has been having sinus pressure in her forehead and cheeks worse on the right side since her earlier visit in November.  She has used doxycyline, allegra and prednisone. Allegra did not really help. She was also given albuterol to help with shortness of breath and it provides temporary relief. She denies fevers and chills. Symptoms include fatigue, productive clear cough, rhinorrhea, itchy ears, sore throat and wheezing.  Covid-19 test was negative.   She reports that her arms are still numb and sore and it also radiates to her neck. The left arm is worse than the right arm.  She went to a chiropractor and he told he he could not help her.   [facility] was [facility] but mentions she cannot go anymore. She will still like the Wellbutrin prescription. She will like to start phentermine again but will like other options for weight loss and diabetes management.   Past Medical History:  Diagnosis Date   Allergic rhinitis    Allergy    Anemia 07/17/2016   Anxiety    Asthma    seasonal   Blood transfusion without reported diagnosis    Breast cancer ([place])    right   Bronchitis    Colon polyps    CPAP (continuous positive airway pressure) dependence    Depression    Diabetes mellitus    borderline but takes metformin   Edema    Family history of breast cancer in first degree relative    Fibromyalgia    GERD (gastroesophageal reflux disease)    Hyperglycemia 03/05/2017   Hyperlipidemia 08/21/2016   no meds   Hyperparathyroidism    Hypertension    controlled by  medications   Joint pain    Neuromuscular disorder ([place])    Fibromyalgia   Obesity    Osteoarthritis    RA   PONV (postoperative nausea and vomiting)    occasional   Pre-diabetes    Sleep apnea    wears cpap   Viral meningitis    Vitamin D deficiency     Past Surgical History:  Procedure Laterality Date   ABDOMINAL HYSTERECTOMY  1998   ADENOIDECTOMY     BREAST EXCISIONAL BIOPSY Left 1993   benign   BREAST SURGERY     Tran flap due to breast cancer   CESAREAN SECTION  1988   COLONOSCOPY  08/14/2011   HAND SURGERY     dog bite, right hand   HAND SURGERY     trauma, left hand   KNEE ARTHROSCOPY     bilateral   left knee replacement  2010   MASTECTOMY  01/13/94   Right breast   parathyroid resection     PARATHYROIDECTOMY     POLYPECTOMY     rectal abscess     SEPTOPLASTY  1980   SHOULDER ARTHROSCOPY Right 11/09/2015   Procedure: ARTHROSCOPY SHOULDER-acromioplasty, distal clavicle resection and debridement;  Surgeon: Melrose Nakayama, MD;  Location: [facility];  Service: Orthopedics;  Laterality: Right;  shoulder arthroscopy     rotator cuff repair   TOE SURGERY Right    paronychia and adenoma removed   TONSILLECTOMY     TOTAL KNEE ARTHROPLASTY  06/18/08   [facility]   TUMOR REMOVAL  1982   , scalp    Family History  Problem Relation Age of Onset   Emphysema Father    Lung cancer Father        lung ca dx 37   Other Father        prostate issues   Diabetes Father    Breast cancer Maternal Aunt        dx late 61s   Colon cancer Maternal Aunt        dx 38s   Colon polyps Maternal Aunt    Prostate cancer Maternal Grandfather 48       d. 85y   Heart disease Paternal Grandfather    Heart attack Paternal Grandfather        d. 50y   Diabetes Paternal Grandfather    Asthma Daughter        "seasonal"   Arthritis Maternal Grandmother    Aneurysm Maternal Grandmother        d. brain aneurysm at 24   Arthritis Mother    Colon polyps Mother    Hypertension Mother     Sleep apnea Mother    Multiple sclerosis Sister    Breast cancer Sister 28       L IDC and DCIS; ER/PR+, Her2-   Fibrocystic breast disease Sister    Arthritis/Rheumatoid Sister    Breast cancer Sister    Cancer Maternal Uncle        d. mouth cancer at younger age; smoker   Other Paternal Uncle        muscle issues - couldn't walk or talk; d. 20y   Cancer Maternal Aunt        lymphoma, dx 59s   Ovarian cancer Maternal Aunt        dx 78s; d. late 23s   Lung cancer Maternal Uncle        d. 89y; former smoker   Throat cancer Cousin        maternal 1st cousin; smoker   Breast cancer Cousin 3       maternal 1st cousin   Other Paternal Uncle        prostate issues   Breast cancer Cousin        paternal 1st cousin dx late 68s   Throat cancer Cousin        maternal 1st cousin; used SL tobacco   Prostate cancer Cousin        maternal 1st cousin dx 41s   Esophageal cancer Neg Hx    Rectal cancer Neg Hx    Stomach cancer Neg Hx     Social History   Socioeconomic History   Marital status: Widowed    Spouse name: Not on file   Number of children: 1   Years of education: Not on file   Highest education level: 12th grade  Occupational History   Occupation: ACCOUNTING    Employer: [employer]  Tobacco Use   Smoking status: Never   Smokeless tobacco: Never  Vaping Use   Vaping Use: Never used  Substance and Sexual Activity   Alcohol use: Not Currently   Drug use: No   Sexual activity: Not on file  Other Topics Concern   Not on file  Social History  Narrative   Not on file   Social Determinants of Health   Financial Resource Strain: Not on file  Food Insecurity: Not on file  Transportation Needs: Not on file  Physical Activity: Not on file  Stress: Not on file  Social Connections: Not on file  Intimate Partner Violence: Not on file    Outpatient Medications Prior to Visit  Medication Sig Dispense Refill   albuterol (VENTOLIN HFA) 108 (90 Base) MCG/ACT inhaler  Inhale 2 puffs into the lungs every 6 (six) hours as needed. 18 g 0   calcium carbonate (OSCAL) 1500 (600 Ca) MG TABS tablet Take 1,500 mg by mouth daily.     cyclobenzaprine (FLEXERIL) 5 MG tablet Take 1 tablet (5 mg total) by mouth at bedtime as needed for muscle spasms. 15 tablet 0   diclofenac Sodium (VOLTAREN) 1 % GEL diclofenac 1 % topical gel  APPLY A SMALL AMOUNT TO AFFECTED AREA FOUR TIMES A DAY AS NEEDED FOR PAIN     famotidine (PEPCID) 40 MG tablet Take 1 tablet (40 mg total) by mouth daily. 30 tablet 5   fluticasone (FLONASE) 50 MCG/ACT nasal spray Place 2 sprays into both nostrils daily. 16 g 5   furosemide (LASIX) 40 MG tablet Take 1 tablet (40 mg total) by mouth 2 (two) times daily. 180 tablet 0   glucosamine-chondroitin 500-400 MG tablet Take 2 tablets by mouth 2 (two) times daily.     hydrochlorothiazide (MICROZIDE) 12.5 MG capsule TAKE 1 CAPSULE BY MOUTH  DAILY 90 capsule 3   Insulin Pen Needle 32G X 6 MM MISC Use with Victoza daily 50 each 0   liraglutide (VICTOZA) 18 MG/3ML SOPN Start 0.48m SQ once a day for 7 days, then increase to 1.239monce a day 6 mL 0   meloxicam (MOBIC) 15 MG tablet TAKE 1 TABLET BY MOUTH  DAILY AS NEEDED FOR PAIN 90 tablet 3   metFORMIN (GLUCOPHAGE) 500 MG tablet TAKE 1 TABLET BY MOUTH  TWICE DAILY WITH A MEAL 180 tablet 3   Multiple Vitamins-Minerals (MULTIVITAMIN & MINERAL PO) Take 1 tablet by mouth daily.     sertraline (ZOLOFT) 50 MG tablet Take 0.5 tablets (25 mg total) by mouth 2 (two) times daily. 90 tablet 1   telmisartan (MICARDIS) 20 MG tablet TAKE 1 TABLET BY MOUTH  DAILY 90 tablet 1   tiZANidine (ZANAFLEX) 2 MG tablet Take 1 tablet (2 mg total) by mouth 3 (three) times daily. 90 tablet 1   traZODone (DESYREL) 50 MG tablet Take 0.5-1 tablets (25-50 mg total) by mouth at bedtime as needed for sleep. 30 tablet 3   Zinc 30 MG CAPS Take by mouth.     buPROPion (WELLBUTRIN SR) 150 MG 12 hr tablet Take 1 tablet (150 mg total) by mouth daily. 30  tablet 0   atorvastatin (LIPITOR) 10 MG tablet Take 1 tablet (10 mg total) by mouth 2 (two) times a week. (Patient not taking: Reported on 04/13/2021) 10 tablet 3   HYDROcodone bit-homatropine (HYCODAN) 5-1.5 MG/5ML syrup Take 5 mLs by mouth every 6 (six) hours as needed for cough. (Patient not taking: Reported on 04/13/2021) 120 mL 0   No facility-administered medications prior to visit.    Allergies  Allergen Reactions   Allopurinol Hives   Mucinex [Guaifenesin Er] Shortness Of Breath   Penicillins Hives and Other (See Comments)    Has patient had a PCN reaction causing immediate rash, facial/tongue/throat swelling, SOB or lightheadedness with hypotension: no Has patient had  a PCN reaction causing severe rash involving mucus membranes or skin necrosis: no Has patient had a PCN reaction that required hospitalization no Has patient had a PCN reaction occurring within the last 10 years: no If all of the above answers are "NO", then may proceed with Cephalosporin use.    Prozac [Fluoxetine Hcl] Hives   Amitriptyline Other (See Comments)    "felt weird, fatigue, dizziness"   Lexapro [Escitalopram Oxalate]     Nausea and hypersalivation.     Review of Systems  Constitutional:  Positive for malaise/fatigue. Negative for chills and fever.  HENT:  Positive for sinus pain and sore throat. Negative for congestion.        (+) rhinorrhea (+) Itchy ears   Eyes:  Negative for redness.  Respiratory:  Positive for cough, sputum production and wheezing. Negative for shortness of breath.   Cardiovascular:  Negative for chest pain, palpitations and leg swelling.  Gastrointestinal:  Negative for abdominal pain, blood in stool and nausea.  Genitourinary:  Negative for dysuria and frequency.  Musculoskeletal:  Positive for neck pain. Negative for falls.       (+) bilateral arm pain   Skin:  Negative for rash.  Neurological:  Negative for dizziness, loss of consciousness and headaches.       (+)  numbness in left arm  Endo/Heme/Allergies:  Negative for polydipsia.  Psychiatric/Behavioral:  Negative for depression. The patient is not nervous/anxious.       Objective:    Physical Exam Constitutional:      General: She is not in acute distress.    Appearance: She is well-developed.  HENT:     Head: Normocephalic and atraumatic.     Right Ear: Tympanic membrane, ear canal and external ear normal.     Left Ear: Tympanic membrane, ear canal and external ear normal.     Nose:     Right Sinus: Maxillary sinus tenderness present.     Left Sinus: Maxillary sinus tenderness present.  Eyes:     Conjunctiva/sclera: Conjunctivae normal.  Neck:     Thyroid: No thyromegaly.  Cardiovascular:     Rate and Rhythm: Normal rate and regular rhythm.     Heart sounds: Normal heart sounds. No murmur heard. Pulmonary:     Effort: Pulmonary effort is normal. No respiratory distress.     Breath sounds: Normal breath sounds.  Abdominal:     General: Bowel sounds are normal. There is no distension.     Palpations: Abdomen is soft. There is no mass.     Tenderness: There is no abdominal tenderness.  Musculoskeletal:     Cervical back: Neck supple.  Lymphadenopathy:     Cervical: No cervical adenopathy.  Skin:    General: Skin is warm and dry.  Neurological:     Mental Status: She is alert and oriented to person, place, and time.  Psychiatric:        Behavior: Behavior normal.    BP 114/68   Pulse 76   Temp 98.5 F (36.9 C)   Resp 16   Wt 268 lb 9.6 oz (121.8 kg)   SpO2 98%   BMI 43.35 kg/m  Wt Readings from Last 3 Encounters:  05/02/21 268 lb 9.6 oz (121.8 kg)  04/13/21 263 lb (119.3 kg)  04/08/21 262 lb (118.8 kg)    Diabetic Foot Exam - Simple   No data filed    Lab Results  Component Value Date   WBC 9.0 02/22/2021   HGB  12.6 02/22/2021   HCT 38.6 02/22/2021   PLT 227 02/22/2021   GLUCOSE 117 (H) 04/08/2021   CHOL 159 10/28/2020   TRIG 156.0 (H) 10/28/2020   HDL  51.40 10/28/2020   LDLDIRECT 110.0 08/21/2016   LDLCALC 76 10/28/2020   ALT 14 04/08/2021   AST 16 04/08/2021   NA 140 04/08/2021   K 4.3 04/08/2021   CL 102 04/08/2021   CREATININE 0.94 04/08/2021   BUN 23 04/08/2021   CO2 31 04/08/2021   TSH 0.82 10/28/2020   INR 2.0 (H) 06/21/2008   HGBA1C 6.6 (H) 04/08/2021    Lab Results  Component Value Date   TSH 0.82 10/28/2020   Lab Results  Component Value Date   WBC 9.0 02/22/2021   HGB 12.6 02/22/2021   HCT 38.6 02/22/2021   MCV 87.5 02/22/2021   PLT 227 02/22/2021   Lab Results  Component Value Date   NA 140 04/08/2021   K 4.3 04/08/2021   CO2 31 04/08/2021   GLUCOSE 117 (H) 04/08/2021   BUN 23 04/08/2021   CREATININE 0.94 04/08/2021   BILITOT 0.6 04/08/2021   ALKPHOS 61 04/08/2021   AST 16 04/08/2021   ALT 14 04/08/2021   PROT 6.8 04/08/2021   ALBUMIN 4.2 04/08/2021   CALCIUM 9.3 04/08/2021   ANIONGAP 8 02/22/2021   GFR 63.14 04/08/2021   Lab Results  Component Value Date   CHOL 159 10/28/2020   Lab Results  Component Value Date   HDL 51.40 10/28/2020   Lab Results  Component Value Date   LDLCALC 76 10/28/2020   Lab Results  Component Value Date   TRIG 156.0 (H) 10/28/2020   Lab Results  Component Value Date   CHOLHDL 3 10/28/2020   Lab Results  Component Value Date   HGBA1C 6.6 (H) 04/08/2021       Assessment & Plan:   Problem List Items Addressed This Visit     Vitamin D deficiency    Supplement and monitor      Essential hypertension    Well controlled, no changes to meds. Encouraged heart healthy diet such as the DASH diet and exercise as tolerated.       Acute sinusitis    Symptoms persistent for over a month now. Improves some with therapy but then worsens again. Started on BactrimDS and Mucinex bid, continue nasal saline and Flonase. Add Xyzal and Singulair. Hydrate and rest.       Relevant Medications   levocetirizine (XYZAL) 5 MG tablet   sulfamethoxazole-trimethoprim  (BACTRIM DS) 800-160 MG tablet   methylPREDNISolone (MEDROL) 4 MG tablet   Diabetes mellitus, type 2 (HCC)    hgba1c acceptable, minimize simple carbs. Increase exercise as tolerated. Continue current meds but drop the Metformin to once a day which she tolerated better and add Ozempic at 0.25 mg weekly and then increase to 0.5 mg next month and up from there every 4 weeks as needed and as tolerated      Relevant Medications   Semaglutide,0.25 or 0.5MG/DOS, (OZEMPIC, 0.25 OR 0.5 MG/DOSE,) 2 MG/1.5ML SOPN   OSA (obstructive sleep apnea)    Recently seen by Dr Elsworth Soho of pulmonogy for continuation of her CPAP. No changes      Neck pain - Primary    Encouraged moist heat and gentle stretching as tolerated. May try NSAIDs and prescription meds as directed and report if symptoms worsen or seek immediate care. Referred to Sports medicine for further evaluation.  Relevant Orders   Ambulatory referral to Sports Medicine   Other Visit Diagnoses     Other depression, with emotional eating       Relevant Medications   buPROPion (WELLBUTRIN SR) 150 MG 12 hr tablet        Meds ordered this encounter  Medications   montelukast (SINGULAIR) 10 MG tablet    Sig: Take 1 tablet (10 mg total) by mouth at bedtime.    Dispense:  30 tablet    Refill:  1   levocetirizine (XYZAL) 5 MG tablet    Sig: Take 1 tablet (5 mg total) by mouth every evening.    Dispense:  30 tablet    Refill:  1   buPROPion (WELLBUTRIN SR) 150 MG 12 hr tablet    Sig: Take 1 tablet (150 mg total) by mouth daily.    Dispense:  90 tablet    Refill:  1   sulfamethoxazole-trimethoprim (BACTRIM DS) 800-160 MG tablet    Sig: Take 1 tablet by mouth 2 (two) times daily.    Dispense:  28 tablet    Refill:  0   methylPREDNISolone (MEDROL) 4 MG tablet    Sig: 5 tabs po x 1 day then 4 tabs po x 1 day then 3 tabs po x 1 day then 2 tabs po x 1 day then 1 tab po x 1 day and stop    Dispense:  15 tablet    Refill:  0    Semaglutide,0.25 or 0.5MG/DOS, (OZEMPIC, 0.25 OR 0.5 MG/DOSE,) 2 MG/1.5ML SOPN    Sig: Inject 0.25 mg into the skin once a week.    Dispense:  0.75 mL    Refill:  0    I,[name],acting as a scribe for Penni Homans, MD.,have documented all relevant documentation on the behalf of Penni Homans, MD,as directed by  Penni Homans, MD while in the presence of Penni Homans, MD.   I, Holly Lukes, MD., personally preformed the services described in this documentation.  All medical record entries made by the scribe were at my direction and in my presence.  I have reviewed the chart and discharge instructions (if applicable) and agree that the record reflects my personal performance and is accurate and complete. 05/02/2021

## 2021-05-02 NOTE — Assessment & Plan Note (Signed)
Symptoms persistent for over a month now. Improves some with therapy but then worsens again. Started on BactrimDS and Mucinex bid, continue nasal saline and Flonase. Add Xyzal and Singulair. Hydrate and rest.

## 2021-05-03 NOTE — Progress Notes (Signed)
Benito Mccreedy D.[place] Phone: [phone]   Assessment and Plan:     1. Neck pain 2. DDD (degenerative disc disease), cervical 3. Cervical disc disorder with radiculopathy of cervical region -Chronic with exacerbation, initial sports medicine visit - Chronic neck pain with acute flare and radicular symptoms into left upper extremity that have failed to improve with >6 weeks of conservative therapy including work-up by PCP, chiropractor, rest, course of NSAIDs - Patient has significant DDD of cervical spine which is likely leading to nerve impingement and radiculopathy.  We will further evaluate with C-spine MRI and consider referral for facet injections after imaging study is performed - Start Tylenol 500 mg 2 tablets 2-3 times daily for regular daily pain control - Discontinue daily meloxicam.  Start NSAIDs as needed for breakthrough pain - Can complete methylprednisone course over the next 2 days   Pertinent previous records reviewed include PCP note 05/02/2021, cervical spine x-ray 01/22/2021, left shoulder x-ray 01/22/2021   Follow Up: After MRI to discuss images and treatment plan.  Would consider referral for facet injection.  Can also refer to physical therapy if patient prefers at any time.   Subjective:    Chief Complaint: Neck pain   HPI:   1207/2022 Patient is a 65 year old female presenting with neck pain with pain in bilateral upper extremeties. Patient states that she is having soreness and numbness in both upper extremeties. Patient was seen by a chiropractor that said he could not help her. Patient was seen by her PCP on 05/02/21 and was informed to use heat, do gentle stretching and take NSAIDS. Patient was referred to sports medicine for evaluation and treatment. Today patient states that she thinks her pain is coming from looking down L arm goes numb,tingles, and grip strength has decreased has  been going on since summer  did an X ray at doctor [facility] 2 months ago saw chiropractor and chiro was not able to help   Relevant Historical Information: DM type II, fibromyalgia, OSA, hypertension  Additional pertinent review of systems negative.   Current Outpatient Medications:    albuterol (VENTOLIN HFA) 108 (90 Base) MCG/ACT inhaler, Inhale 2 puffs into the lungs every 6 (six) hours as needed., Disp: 18 g, Rfl: 0   atorvastatin (LIPITOR) 10 MG tablet, Take 1 tablet (10 mg total) by mouth 2 (two) times a week. (Patient not taking: Reported on 04/13/2021), Disp: 10 tablet, Rfl: 3   buPROPion (WELLBUTRIN SR) 150 MG 12 hr tablet, Take 1 tablet (150 mg total) by mouth daily., Disp: 90 tablet, Rfl: 1   calcium carbonate (OSCAL) 1500 (600 Ca) MG TABS tablet, Take 1,500 mg by mouth daily., Disp: , Rfl:    cyclobenzaprine (FLEXERIL) 5 MG tablet, Take 1 tablet (5 mg total) by mouth at bedtime as needed for muscle spasms., Disp: 15 tablet, Rfl: 0   diclofenac Sodium (VOLTAREN) 1 % GEL, diclofenac 1 % topical gel  APPLY A SMALL AMOUNT TO AFFECTED AREA FOUR TIMES A DAY AS NEEDED FOR PAIN, Disp: , Rfl:    famotidine (PEPCID) 40 MG tablet, Take 1 tablet (40 mg total) by mouth daily., Disp: 30 tablet, Rfl: 5   fluticasone (FLONASE) 50 MCG/ACT nasal spray, Place 2 sprays into both nostrils daily., Disp: 16 g, Rfl: 5   furosemide (LASIX) 40 MG tablet, Take 1 tablet (40 mg total) by mouth 2 (two) times daily., Disp: 180 tablet, Rfl: 0  glucosamine-chondroitin 500-400 MG tablet, Take 2 tablets by mouth 2 (two) times daily., Disp: , Rfl:    hydrochlorothiazide (MICROZIDE) 12.5 MG capsule, TAKE 1 CAPSULE BY MOUTH  DAILY, Disp: 90 capsule, Rfl: 3   Insulin Pen Needle 32G X 6 MM MISC, Use with Victoza daily, Disp: 50 each, Rfl: 0   levocetirizine (XYZAL) 5 MG tablet, Take 1 tablet (5 mg total) by mouth every evening., Disp: 30 tablet, Rfl: 1   liraglutide (VICTOZA) 18 MG/3ML SOPN, Start 0.6mg  SQ once a day for 7  days, then increase to 1.2mg  once a day, Disp: 6 mL, Rfl: 0   Liraglutide -Weight Management (SAXENDA) 18 MG/3ML SOPN, Inject 0.6 mg into the skin daily for 7 days, THEN 1.2 mg daily for 7 days, THEN 1.8 mg daily for 7 days, THEN 2.4 mg daily for 7 days., Disp: 7 mL, Rfl: 0   Liraglutide -Weight Management (SAXENDA) 18 MG/3ML SOPN, Inject 3 mg into the skin daily., Disp: 15 mL, Rfl: 2   meloxicam (MOBIC) 15 MG tablet, TAKE 1 TABLET BY MOUTH  DAILY AS NEEDED FOR PAIN, Disp: 90 tablet, Rfl: 3   metFORMIN (GLUCOPHAGE) 500 MG tablet, TAKE 1 TABLET BY MOUTH  TWICE DAILY WITH A MEAL, Disp: 180 tablet, Rfl: 3   methylPREDNISolone (MEDROL) 4 MG tablet, 5 tabs po x 1 day then 4 tabs po x 1 day then 3 tabs po x 1 day then 2 tabs po x 1 day then 1 tab po x 1 day and stop, Disp: 15 tablet, Rfl: 0   montelukast (SINGULAIR) 10 MG tablet, Take 1 tablet (10 mg total) by mouth at bedtime., Disp: 30 tablet, Rfl: 1   Multiple Vitamins-Minerals (MULTIVITAMIN & MINERAL PO), Take 1 tablet by mouth daily., Disp: , Rfl:    sertraline (ZOLOFT) 50 MG tablet, Take 0.5 tablets (25 mg total) by mouth 2 (two) times daily., Disp: 90 tablet, Rfl: 1   sulfamethoxazole-trimethoprim (BACTRIM DS) 800-160 MG tablet, Take 1 tablet by mouth 2 (two) times daily., Disp: 28 tablet, Rfl: 0   telmisartan (MICARDIS) 20 MG tablet, TAKE 1 TABLET BY MOUTH  DAILY, Disp: 90 tablet, Rfl: 1   tiZANidine (ZANAFLEX) 2 MG tablet, Take 1 tablet (2 mg total) by mouth 3 (three) times daily., Disp: 90 tablet, Rfl: 1   traZODone (DESYREL) 50 MG tablet, Take 0.5-1 tablets (25-50 mg total) by mouth at bedtime as needed for sleep., Disp: 30 tablet, Rfl: 3   Zinc 30 MG CAPS, Take by mouth., Disp: , Rfl:    Objective:     Vitals:   05/04/21 1428  BP: 118/76  Pulse: 86  SpO2: 94%  Weight: 260 lb (117.9 kg)  Height: 5\' 6"  (1.676 m)      Body mass index is 41.97 kg/m.    Physical Exam:    Cervical Spine: Decreased lordosis of cervical spine Neck ROM:  Limited in all range of motion, especially in extension  Neurological:  Deep Tendon Reflexes:   Right  Left   Braachioradialis 2+ 2+  Bicep 2+  2+    Strength   Right  Left   Bicep 5/5  5/5  Tricep 5/5 5/5  Grip 5/5 5/5  Deltoid 5/5 5/5   Sensation: intact to light touch Skin: normal, intact Spurling's:  negative bilaterally TTP: Cervical paraspinal muscles, thoracic paraspinal muscles, trapezius bilaterally NTTP:  cervical spinous processes,     Electronically signed by:  Benito Mccreedy D.[facility] 3:02 PM 05/04/21

## 2021-05-04 ENCOUNTER — Ambulatory Visit (INDEPENDENT_AMBULATORY_CARE_PROVIDER_SITE_OTHER): Payer: Medicare Other | Admitting: Sports Medicine

## 2021-05-04 ENCOUNTER — Other Ambulatory Visit: Payer: Self-pay

## 2021-05-04 ENCOUNTER — Telehealth: Payer: Self-pay

## 2021-05-04 VITALS — BP 118/76 | HR 86 | Ht 66.0 in | Wt 260.0 lb

## 2021-05-04 DIAGNOSIS — M5030 Other cervical disc degeneration, unspecified cervical region: Secondary | ICD-10-CM

## 2021-05-04 DIAGNOSIS — M542 Cervicalgia: Secondary | ICD-10-CM

## 2021-05-04 DIAGNOSIS — M5010 Cervical disc disorder with radiculopathy, unspecified cervical region: Secondary | ICD-10-CM | POA: Diagnosis not present

## 2021-05-04 HISTORY — DX: Other cervical disc degeneration, unspecified cervical region: M50.30

## 2021-05-04 NOTE — Patient Instructions (Addendum)
Good to see you MRI placed, call [phone] to schedule  Stop daily Meloxicam use as needed for break through pain  Start Tylenol 500 mg 2 tablets 2/3 times a day for pain relief  Start HEP for neck Call clinic to schedule appointment after MRI to discuss results

## 2021-05-04 NOTE — Telephone Encounter (Signed)
Prior auth prior started for Saxenda 18MG /3ML pen-injectors [id number]  Awaiting response

## 2021-05-06 NOTE — Telephone Encounter (Signed)
Prior was denied to Medicare does not cover weight loss

## 2021-05-09 ENCOUNTER — Other Ambulatory Visit: Payer: Self-pay | Admitting: Family Medicine

## 2021-05-09 MED ORDER — TIRZEPATIDE 2.5 MG/0.5ML ~~LOC~~ SOAJ: 2.5000 mg | SUBCUTANEOUS | 0 refills | Status: DC

## 2021-05-19 ENCOUNTER — Encounter: Payer: Self-pay | Admitting: Family Medicine

## 2021-05-22 ENCOUNTER — Other Ambulatory Visit: Payer: Self-pay | Admitting: Family Medicine

## 2021-05-22 DIAGNOSIS — I10 Essential (primary) hypertension: Secondary | ICD-10-CM

## 2021-05-26 DIAGNOSIS — F32A Depression, unspecified: Secondary | ICD-10-CM | POA: Diagnosis not present

## 2021-05-26 DIAGNOSIS — I10 Essential (primary) hypertension: Secondary | ICD-10-CM | POA: Diagnosis not present

## 2021-05-26 DIAGNOSIS — G4733 Obstructive sleep apnea (adult) (pediatric): Secondary | ICD-10-CM | POA: Diagnosis not present

## 2021-05-31 ENCOUNTER — Encounter: Payer: Self-pay | Admitting: Family Medicine

## 2021-05-31 MED ORDER — TIRZEPATIDE 2.5 MG/0.5ML ~~LOC~~ SOAJ: 2.5000 mg | SUBCUTANEOUS | 1 refills | Status: DC

## 2021-06-13 ENCOUNTER — Other Ambulatory Visit: Payer: Self-pay | Admitting: Family Medicine

## 2021-06-15 ENCOUNTER — Other Ambulatory Visit: Payer: Medicare Other

## 2021-06-17 ENCOUNTER — Ambulatory Visit: Payer: Medicare Other | Admitting: Medical

## 2021-06-23 ENCOUNTER — Encounter: Payer: Self-pay | Admitting: Family Medicine

## 2021-06-23 ENCOUNTER — Other Ambulatory Visit: Payer: Self-pay | Admitting: Family Medicine

## 2021-06-23 MED ORDER — TIRZEPATIDE 7.5 MG/0.5ML ~~LOC~~ SOAJ: 7.5000 mg | SUBCUTANEOUS | 0 refills | Status: DC

## 2021-06-23 MED ORDER — TIRZEPATIDE 5 MG/0.5ML ~~LOC~~ SOAJ: 5.0000 mg | SUBCUTANEOUS | 0 refills | Status: DC

## 2021-06-25 ENCOUNTER — Other Ambulatory Visit: Payer: Self-pay

## 2021-06-25 ENCOUNTER — Ambulatory Visit
Admission: RE | Admit: 2021-06-25 | Discharge: 2021-06-25 | Disposition: A | Discharge status: Home / Self Care | Payer: Medicare Other | Source: Ambulatory Visit | Attending: Sports Medicine | Admitting: Sports Medicine

## 2021-06-25 DIAGNOSIS — M542 Cervicalgia: Secondary | ICD-10-CM | POA: Diagnosis not present

## 2021-06-25 DIAGNOSIS — M5030 Other cervical disc degeneration, unspecified cervical region: Secondary | ICD-10-CM

## 2021-06-25 DIAGNOSIS — M4802 Spinal stenosis, cervical region: Secondary | ICD-10-CM | POA: Diagnosis not present

## 2021-06-25 DIAGNOSIS — M5010 Cervical disc disorder with radiculopathy, unspecified cervical region: Secondary | ICD-10-CM

## 2021-06-26 DIAGNOSIS — F32A Depression, unspecified: Secondary | ICD-10-CM | POA: Diagnosis not present

## 2021-06-26 DIAGNOSIS — G4733 Obstructive sleep apnea (adult) (pediatric): Secondary | ICD-10-CM | POA: Diagnosis not present

## 2021-06-26 DIAGNOSIS — I10 Essential (primary) hypertension: Secondary | ICD-10-CM | POA: Diagnosis not present

## 2021-07-19 ENCOUNTER — Encounter: Payer: Self-pay | Admitting: Family Medicine

## 2021-07-19 ENCOUNTER — Telehealth: Payer: Self-pay

## 2021-07-19 NOTE — Telephone Encounter (Signed)
PA initiated via Covermymeds; KEY: [id number].   PA cancelled by plan.   This medication or product is on your plan's list of covered drugs. Prior authorization is not required at this time. If your pharmacy has questions regarding the processing of your prescription, please have them call the [facility] pharmacy help desk at [phone]. **Please note: This request was submitted electronically. Formulary lowering, tiering exception, cost reduction and/or pre-benefit determination review (including prospective Medicare hospice reviews) requests cannot be requested using this method of submission. Providers contact us at [phone] for further assistance

## 2021-07-26 ENCOUNTER — Other Ambulatory Visit: Payer: Self-pay | Admitting: Family Medicine

## 2021-07-26 DIAGNOSIS — I10 Essential (primary) hypertension: Secondary | ICD-10-CM | POA: Diagnosis not present

## 2021-07-26 DIAGNOSIS — G4733 Obstructive sleep apnea (adult) (pediatric): Secondary | ICD-10-CM | POA: Diagnosis not present

## 2021-07-26 DIAGNOSIS — F32A Depression, unspecified: Secondary | ICD-10-CM | POA: Diagnosis not present

## 2021-07-27 DIAGNOSIS — E119 Type 2 diabetes mellitus without complications: Secondary | ICD-10-CM | POA: Diagnosis not present

## 2021-07-27 NOTE — Telephone Encounter (Signed)
Spoke with ins and they stated medication was filled at [facility] and was $47.  I called patient and she stated she may be headed into the doughnut hole and medication will be a lot of money.  I advised that she should call her insurance to get them to help her understand her plan and other medications that they may cover at a reasonable price.  She stated that this medication is working for her and not having any side effects.  Patient states that she will call her insurance. ?

## 2021-08-02 ENCOUNTER — Ambulatory Visit: Payer: Medicare Other

## 2021-08-16 ENCOUNTER — Encounter: Payer: Self-pay | Admitting: Family Medicine

## 2021-08-16 ENCOUNTER — Ambulatory Visit (INDEPENDENT_AMBULATORY_CARE_PROVIDER_SITE_OTHER): Payer: Medicare Other | Admitting: Family Medicine

## 2021-08-16 VITALS — BP 134/71 | HR 81 | Ht 66.0 in | Wt 271.6 lb

## 2021-08-16 DIAGNOSIS — M545 Low back pain, unspecified: Secondary | ICD-10-CM

## 2021-08-16 MED ORDER — PREDNISONE 20 MG PO TABS: 40.0000 mg | ORAL_TABLET | Freq: Every day | ORAL | 0 refills | Status: AC

## 2021-08-16 NOTE — Telephone Encounter (Signed)
Pt scheduled for same day appointment with Caleen Jobs. ?

## 2021-08-16 NOTE — Patient Instructions (Signed)
Prednisone for 5 days (do not take with meloxicam, ibuprofen, Aleve, etc.) ?When finished with prednisone, resume the meloxicam ?Ice, heat, tylenol, massage, home exercises (handout provided) ?If not improving in 3-4 week or if worse before then, refer to sports medicine.  ?If symptoms become severe, go to the ED.  ?

## 2021-08-16 NOTE — Progress Notes (Signed)
? ?Acute Office Visit ? ?Subjective:  ? ? Patient ID: [name], female    DOB: 01/22/1956, 66 y.o.   MRN: [id number] ? ?CC: low back pain, right ? ? ?HPI ?Patient is in today for low back pain, right side. ? ?Onset: off and on for a few weeks, more consistent for the past 3 days  ?Location: right lower back/upper buttocks, (points to SI area) ?Duration: constant  ?Characteristics: stabbing, shooting, aching ?Aggravating factors: sitting/walking ?Alleviating factors: no ?Radiating/associated symptoms: not radiating, right hip/upper leg mildly sore  ?Timing: usually okay first thing in the morning, worse when she starts moving around  ?Severity: 10/10 all day today, yesterday 7-8/10 ?Meloxicam, Tylenol arthritis, ice, heat - helping somewhat, but not fully  ?No radiation/numbness/tingling, incontinence, weakness ? ? ?Past Medical History:  ?Diagnosis Date  ? Allergic rhinitis   ? Allergy   ? Anemia 07/17/2016  ? Anxiety   ? Asthma   ? seasonal  ? Blood transfusion without reported diagnosis   ? Breast cancer ([place])   ? right  ? Bronchitis   ? Colon polyps   ? CPAP (continuous positive airway pressure) dependence   ? Depression   ? Diabetes mellitus   ? borderline but takes metformin  ? Edema   ? Family history of breast cancer in first degree relative   ? Fibromyalgia   ? GERD (gastroesophageal reflux disease)   ? Hyperglycemia 03/05/2017  ? Hyperlipidemia 08/21/2016  ? no meds  ? Hyperparathyroidism   ? Hypertension   ? controlled by medications  ? Joint pain   ? Neuromuscular disorder ([place])   ? Fibromyalgia  ? Obesity   ? Osteoarthritis   ? RA  ? PONV (postoperative nausea and vomiting)   ? occasional  ? Pre-diabetes   ? Sleep apnea   ? wears cpap  ? Viral meningitis   ? Vitamin D deficiency   ? ? ?Past Surgical History:  ?Procedure Laterality Date  ? ABDOMINAL HYSTERECTOMY  1998  ? ADENOIDECTOMY    ? BREAST EXCISIONAL BIOPSY Left 1993  ? benign  ? BREAST SURGERY    ? Tran flap due to breast cancer  ?  [place]  ? COLONOSCOPY  08/14/2011  ? HAND SURGERY    ? dog bite, right hand  ? HAND SURGERY    ? trauma, left hand  ? KNEE ARTHROSCOPY    ? bilateral  ? left knee replacement  2010  ? MASTECTOMY  01/13/94  ? Right breast  ? parathyroid resection    ? PARATHYROIDECTOMY    ? POLYPECTOMY    ? rectal abscess    ? SEPTOPLASTY  1980  ? SHOULDER ARTHROSCOPY Right 11/09/2015  ? Procedure: ARTHROSCOPY SHOULDER-acromioplasty, distal clavicle resection and debridement;  Surgeon: Melrose Nakayama, MD;  Location: [facility];  Service: Orthopedics;  Laterality: Right;  ? shoulder arthroscopy    ? rotator cuff repair  ? TOE SURGERY Right   ? paronychia and adenoma removed  ? TONSILLECTOMY    ? TOTAL KNEE ARTHROPLASTY  06/18/08  ? [place]  ? TUMOR REMOVAL  1982  ? , scalp  ? ? ?Family History  ?Problem Relation Age of Onset  ? Emphysema Father   ? Lung cancer Father   ?     lung ca dx 43  ? Other Father   ?     prostate issues  ? Diabetes Father   ? Breast cancer Maternal Aunt   ?  dx late 49s  ? Colon cancer Maternal Aunt   ?     dx 21s  ? Colon polyps Maternal Aunt   ? Prostate cancer Maternal Grandfather 78  ?     d. 85y  ? Heart disease Paternal Grandfather   ? Heart attack Paternal Grandfather   ?     d. 50y  ? Diabetes Paternal Grandfather   ? Asthma Daughter   ?     "seasonal"  ? Arthritis Maternal Grandmother   ? Aneurysm Maternal Grandmother   ?     d. brain aneurysm at 46  ? Arthritis Mother   ? Colon polyps Mother   ? Hypertension Mother   ? Sleep apnea Mother   ? Multiple sclerosis Sister   ? Breast cancer Sister 81  ?     L IDC and DCIS; ER/PR+, Her2-  ? Fibrocystic breast disease Sister   ? Arthritis/Rheumatoid Sister   ? Breast cancer Sister   ? Cancer Maternal Uncle   ?     d. mouth cancer at younger age; smoker  ? Other Paternal Uncle   ?     muscle issues - couldn't walk or talk; d. 20y  ? Cancer Maternal Aunt   ?     lymphoma, dx 70s  ? Ovarian cancer Maternal Aunt   ?     dx 57s; d. late 72s  ? Lung  cancer Maternal Uncle   ?     d. 10y; former smoker  ? Throat cancer Cousin   ?     maternal 1st cousin; smoker  ? Breast cancer Cousin 56  ?     maternal 1st cousin  ? Other Paternal Uncle   ?     prostate issues  ? Breast cancer Cousin   ?     paternal 1st cousin dx late 29s  ? Throat cancer Cousin   ?     maternal 1st cousin; used SL tobacco  ? Prostate cancer Cousin   ?     maternal 1st cousin dx 54s  ? Esophageal cancer Neg Hx   ? Rectal cancer Neg Hx   ? Stomach cancer Neg Hx   ? ? ?Social History  ? ?Socioeconomic History  ? Marital status: Widowed  ?  Spouse name: Not on file  ? Number of children: 1  ? Years of education: Not on file  ? Highest education level: 12th grade  ?Occupational History  ? Occupation: ACCOUNTING  ?  Employer: [employer]  ?Tobacco Use  ? Smoking status: Never  ? Smokeless tobacco: Never  ?Vaping Use  ? Vaping Use: Never used  ?Substance and Sexual Activity  ? Alcohol use: Not Currently  ? Drug use: No  ? Sexual activity: Not on file  ?Other Topics Concern  ? Not on file  ?Social History Narrative  ? Not on file  ? ?Social Determinants of Health  ? ?Financial Resource Strain: Not on file  ?Food Insecurity: Not on file  ?Transportation Needs: Not on file  ?Physical Activity: Not on file  ?Stress: Not on file  ?Social Connections: Not on file  ?Intimate Partner Violence: Not on file  ? ? ?Outpatient Medications Prior to Visit  ?Medication Sig Dispense Refill  ? albuterol (VENTOLIN HFA) 108 (90 Base) MCG/ACT inhaler Inhale 2 puffs into the lungs every 6 (six) hours as needed. 18 g 0  ? atorvastatin (LIPITOR) 10 MG tablet Take 1 tablet (10 mg total) by mouth  2 (two) times a week. (Patient not taking: Reported on 04/13/2021) 10 tablet 3  ? buPROPion (WELLBUTRIN SR) 150 MG 12 hr tablet Take 1 tablet (150 mg total) by mouth daily. 90 tablet 1  ? calcium carbonate (OSCAL) 1500 (600 Ca) MG TABS tablet Take 1,500 mg by mouth daily.    ? diclofenac Sodium (VOLTAREN) 1 % GEL diclofenac 1 %  topical gel ? APPLY A SMALL AMOUNT TO AFFECTED AREA FOUR TIMES A DAY AS NEEDED FOR PAIN    ? famotidine (PEPCID) 40 MG tablet Take 1 tablet (40 mg total) by mouth daily. 30 tablet 5  ? fluticasone (FLONASE) 50 MCG/ACT nasal spray Place 2 sprays into both nostrils daily. 16 g 5  ? furosemide (LASIX) 40 MG tablet TAKE 1 TABLET BY MOUTH  TWICE DAILY 180 tablet 3  ? glucosamine-chondroitin 500-400 MG tablet Take 2 tablets by mouth 2 (two) times daily.    ? hydrochlorothiazide (MICROZIDE) 12.5 MG capsule TAKE 1 CAPSULE BY MOUTH  DAILY 90 capsule 1  ? Insulin Pen Needle 32G X 6 MM MISC Use with Victoza daily 50 each 0  ? levocetirizine (XYZAL) 5 MG tablet Take 1 tablet (5 mg total) by mouth every evening. 30 tablet 1  ? meloxicam (MOBIC) 15 MG tablet TAKE 1 TABLET BY MOUTH  DAILY AS NEEDED FOR PAIN 90 tablet 3  ? metFORMIN (GLUCOPHAGE) 500 MG tablet TAKE 1 TABLET BY MOUTH  TWICE DAILY WITH A MEAL 180 tablet 3  ? montelukast (SINGULAIR) 10 MG tablet Take 1 tablet (10 mg total) by mouth at bedtime. 30 tablet 1  ? Multiple Vitamins-Minerals (MULTIVITAMIN & MINERAL PO) Take 1 tablet by mouth daily.    ? sertraline (ZOLOFT) 50 MG tablet TAKE ONE-HALF TABLET BY  MOUTH TWICE DAILY 90 tablet 3  ? telmisartan (MICARDIS) 20 MG tablet TAKE 1 TABLET BY MOUTH  DAILY 90 tablet 1  ? traZODone (DESYREL) 50 MG tablet Take 0.5-1 tablets (25-50 mg total) by mouth at bedtime as needed for sleep. 30 tablet 3  ? Zinc 30 MG CAPS Take by mouth.    ? cyclobenzaprine (FLEXERIL) 5 MG tablet Take 1 tablet (5 mg total) by mouth at bedtime as needed for muscle spasms. 15 tablet 0  ? methylPREDNISolone (MEDROL) 4 MG tablet 5 tabs po x 1 day then 4 tabs po x 1 day then 3 tabs po x 1 day then 2 tabs po x 1 day then 1 tab po x 1 day and stop 15 tablet 0  ? sulfamethoxazole-trimethoprim (BACTRIM DS) 800-160 MG tablet Take 1 tablet by mouth 2 (two) times daily. 28 tablet 0  ? tirzepatide (MOUNJARO) 5 MG/0.5ML Pen Inject 5 mg into the skin once a week. 6 mL  0  ? tirzepatide (MOUNJARO) 7.5 MG/0.5ML Pen Inject 7.5 mg into the skin once a week. 6 mL 0  ? tiZANidine (ZANAFLEX) 2 MG tablet Take 1 tablet (2 mg total) by mouth 3 (three) times daily. 90 tablet 1  ? ?No fac

## 2021-08-16 NOTE — Progress Notes (Signed)
Low back pain for a couple weeks ?Worse on right side ?

## 2021-08-17 DIAGNOSIS — E119 Type 2 diabetes mellitus without complications: Secondary | ICD-10-CM | POA: Diagnosis not present

## 2021-08-24 DIAGNOSIS — G4733 Obstructive sleep apnea (adult) (pediatric): Secondary | ICD-10-CM | POA: Diagnosis not present

## 2021-08-24 DIAGNOSIS — F32A Depression, unspecified: Secondary | ICD-10-CM | POA: Diagnosis not present

## 2021-08-24 DIAGNOSIS — I10 Essential (primary) hypertension: Secondary | ICD-10-CM | POA: Diagnosis not present

## 2021-08-25 DIAGNOSIS — M545 Low back pain, unspecified: Secondary | ICD-10-CM | POA: Diagnosis not present

## 2021-09-05 DIAGNOSIS — M4726 Other spondylosis with radiculopathy, lumbar region: Secondary | ICD-10-CM | POA: Diagnosis not present

## 2021-09-05 DIAGNOSIS — M47816 Spondylosis without myelopathy or radiculopathy, lumbar region: Secondary | ICD-10-CM | POA: Diagnosis not present

## 2021-09-05 DIAGNOSIS — I10 Essential (primary) hypertension: Secondary | ICD-10-CM | POA: Diagnosis not present

## 2021-09-05 DIAGNOSIS — M5441 Lumbago with sciatica, right side: Secondary | ICD-10-CM | POA: Diagnosis not present

## 2021-09-06 ENCOUNTER — Other Ambulatory Visit: Payer: Self-pay | Admitting: Neurosurgery

## 2021-09-06 DIAGNOSIS — M4726 Other spondylosis with radiculopathy, lumbar region: Secondary | ICD-10-CM

## 2021-09-11 ENCOUNTER — Ambulatory Visit
Admission: RE | Admit: 2021-09-11 | Discharge: 2021-09-11 | Disposition: A | Discharge status: Home / Self Care | Payer: Medicare Other | Source: Ambulatory Visit | Attending: Neurosurgery | Admitting: Neurosurgery

## 2021-09-11 DIAGNOSIS — M4726 Other spondylosis with radiculopathy, lumbar region: Secondary | ICD-10-CM

## 2021-09-11 DIAGNOSIS — M48061 Spinal stenosis, lumbar region without neurogenic claudication: Secondary | ICD-10-CM | POA: Diagnosis not present

## 2021-09-11 DIAGNOSIS — M545 Low back pain, unspecified: Secondary | ICD-10-CM | POA: Diagnosis not present

## 2021-09-13 DIAGNOSIS — M47816 Spondylosis without myelopathy or radiculopathy, lumbar region: Secondary | ICD-10-CM | POA: Diagnosis not present

## 2021-09-13 DIAGNOSIS — M4726 Other spondylosis with radiculopathy, lumbar region: Secondary | ICD-10-CM | POA: Diagnosis not present

## 2021-09-13 DIAGNOSIS — M4316 Spondylolisthesis, lumbar region: Secondary | ICD-10-CM | POA: Diagnosis not present

## 2021-09-14 ENCOUNTER — Other Ambulatory Visit: Payer: Self-pay | Admitting: Obstetrics and Gynecology

## 2021-09-14 DIAGNOSIS — Z1231 Encounter for screening mammogram for malignant neoplasm of breast: Secondary | ICD-10-CM

## 2021-09-15 LAB — HM PAP SMEAR: HPV, high-risk: NOT DETECTED

## 2021-09-18 ENCOUNTER — Other Ambulatory Visit: Payer: Self-pay | Admitting: Family Medicine

## 2021-09-18 DIAGNOSIS — F3289 Other specified depressive episodes: Secondary | ICD-10-CM

## 2021-09-19 NOTE — Pre-Procedure Instructions (Signed)
Surgical Instructions ? ? ? Your procedure is scheduled on 09/21/21. ? Report to [facility] Main Entrance "A" at 1030 A.M., then check in with the Admitting office. ? Call this number if you have problems the morning of surgery: ? [phone] ? ? If you have any questions prior to your surgery date call [phone]: Open Monday-Friday 8am-4pm ? ? ? Remember: ? Do not eat after midnight the night before your surgery ? ?You may drink clear liquids until 09:30 the morning of your surgery.   ?Clear liquids allowed are: Water, Non-Citrus Juices (without pulp), Carbonated Beverages, Clear Tea, Black Coffee ONLY (NO MILK, CREAM OR POWDERED CREAMER of any kind), and Gatorade ?  ? Take these medicines the morning of surgery with A SIP OF WATER:  ?buPROPion [facility] SR) ?famotidine (PEPCID) ?sertraline (ZOLOFT) ? ?If needed: ?albuterol (VENTOLIN HFA) ?fluticasone (FLONASE)  ?traMADol Veatrice [place]bon) ? ?Please bring all inhalers with you the day of surgery.  ? ?As of today, STOP taking any Aspirin (unless otherwise instructed by your surgeon) Aleve, Naproxen, Ibuprofen, Motrin, Advil, Goody's, BC's, all herbal medications, fish oil, and all vitamins. ? ?WHAT DO I DO ABOUT MY DIABETES MEDICATION? ? ? ?Do not take metFORMIN (GLUCOPHAGE)the morning of surgery. ? ?The day of surgery, do not take Victoza (liraglutide) ? ? ?HOW TO MANAGE YOUR DIABETES ?BEFORE AND AFTER SURGERY ? ?Why is it important to control my blood sugar before and after surgery? ?Improving blood sugar levels before and after surgery helps healing and can limit problems. ?A way of improving blood sugar control is eating a healthy diet by: ? Eating less sugar and carbohydrates ? Increasing activity/exercise ? Talking with your doctor about reaching your blood sugar goals ?High blood sugars (greater than 180 mg/dL) can raise your risk of infections and slow your recovery, so you will need to focus on controlling your diabetes during the weeks before surgery. ?Make  sure that the doctor who takes care of your diabetes knows about your planned surgery including the date and location. ? ?How do I manage my blood sugar before surgery? ?Check your blood sugar at least 4 times a day, starting 2 days before surgery, to make sure that the level is not too high or low. ? ?Check your blood sugar the morning of your surgery when you wake up and every 2 hours until you get to the Short Stay unit. ? ?If your blood sugar is less than 70 mg/dL, you will need to treat for low blood sugar: ?Do not take insulin. ?Treat a low blood sugar (less than 70 mg/dL) with ? cup of clear juice (cranberry or apple), 4 glucose tablets, OR glucose gel. ?Recheck blood sugar in 15 minutes after treatment (to make sure it is greater than 70 mg/dL). If your blood sugar is not greater than 70 mg/dL on recheck, call [phone] for further instructions. ?Report your blood sugar to the short stay nurse when you get to Short Stay. ? ?If you are admitted to the hospital after surgery: ?Your blood sugar will be checked by the staff and you will probably be given insulin after surgery (instead of oral diabetes medicines) to make sure you have good blood sugar levels. ?The goal for blood sugar control after surgery is 80-180 mg/dL. ? ?         ?Do not wear jewelry or makeup ?Do not wear lotions, powders, perfumes/colognes, or deodorant. ?Do not shave 48 hours prior to surgery.  Men may shave face and neck. ?Do not  bring valuables to the hospital. ?Do not wear nail polish, gel polish, artificial nails, or any other type of covering on natural nails (fingers and toes) ?If you have artificial nails or gel coating that need to be removed by a nail salon, please have this removed prior to surgery. Artificial nails or gel coating may interfere with anesthesia's ability to adequately monitor your vital signs. ? ?[facility] is not responsible for any belongings or valuables. .  ? ?Do NOT Smoke (Tobacco/Vaping)  24 hours prior  to your procedure ? ?If you use a CPAP at night, you may bring your mask for your overnight stay. ?  ?Contacts, glasses, hearing aids, dentures or partials may not be worn into surgery, please bring cases for these belongings ?  ?For patients admitted to the hospital, discharge time will be determined by your treatment team. ?  ?Patients discharged the day of surgery will not be allowed to drive home, and someone needs to stay with them for 24 hours. ? ? ?SURGICAL WAITING ROOM VISITATION ?Patients having surgery or a procedure in a hospital may have two support people. ?Children under the age of 21 must have an adult with them who is not the patient. ?They may stay in the waiting area during the procedure and may switch out with other visitors. If the patient needs to stay at the hospital during part of their recovery, the visitor guidelines for inpatient rooms apply. ? ?Please refer to the [facility] website for the visitor guidelines for Inpatients (after your surgery is over and you are in a regular room).  ? ? ? ? ? ?Special instructions:   ? ?Oral Hygiene is also important to reduce your risk of infection.  Remember - BRUSH YOUR TEETH THE MORNING OF SURGERY WITH YOUR REGULAR TOOTHPASTE ? ? ?[facility]- Preparing For Surgery ? ?Before surgery, you can play an important role. Because skin is not sterile, your skin needs to be as free of germs as possible. You can reduce the number of germs on your skin by washing with CHG (chlorahexidine gluconate) Soap before surgery.  CHG is an antiseptic cleaner which kills germs and bonds with the skin to continue killing germs even after washing.   ? ? ?Please do not use if you have an allergy to CHG or antibacterial soaps. If your skin becomes reddened/irritated stop using the CHG.  ?Do not shave (including legs and underarms) for at least 48 hours prior to first CHG shower. It is OK to shave your face. ? ?Please follow these instructions carefully. ?  ? ? Shower the NIGHT  BEFORE SURGERY and the MORNING OF SURGERY with CHG Soap.  ? If you chose to wash your hair, wash your hair first as usual with your normal shampoo. After you shampoo, rinse your hair and body thoroughly to remove the shampoo.  Then [employer] and genitals (private parts) with your normal soap and rinse thoroughly to remove soap. ? ?After that Use CHG Soap as you would any other liquid soap. You can apply CHG directly to the skin and wash gently with a scrungie or a clean washcloth.  ? ?Apply the CHG Soap to your body ONLY FROM THE NECK DOWN.  Do not use on open wounds or open sores. Avoid contact with your eyes, ears, mouth and genitals (private parts). Wash Face and genitals (private parts)  with your normal soap.  ? ?Wash thoroughly, paying special attention to the area where your surgery will be performed. ? ?  Thoroughly rinse your body with warm water from the neck down. ? ?DO NOT shower/wash with your normal soap after using and rinsing off the CHG Soap. ? ?Pat yourself dry with a CLEAN TOWEL. ? ?Wear CLEAN PAJAMAS to bed the night before surgery ? ?Place CLEAN SHEETS on your bed the night before your surgery ? ?DO NOT SLEEP WITH PETS. ? ? ?Day of Surgery: ? ?Take a shower with CHG soap. ?Wear Clean/Comfortable clothing the morning of surgery ?Do not apply any deodorants/lotions.   ?Remember to brush your teeth WITH YOUR REGULAR TOOTHPASTE. ? ? ? ?If you received a COVID test during your pre-op visit, it is requested that you wear a mask when out in public, stay away from anyone that may not be feeling well, and notify your surgeon if you develop symptoms. If you have been in contact with anyone that has tested positive in the last 10 days, please notify your surgeon. ? ?  ?Please read over the following fact sheets that you were given.  ? ?

## 2021-09-20 ENCOUNTER — Other Ambulatory Visit: Payer: Self-pay

## 2021-09-20 ENCOUNTER — Encounter (HOSPITAL_COMMUNITY): Payer: Self-pay

## 2021-09-20 ENCOUNTER — Encounter (HOSPITAL_COMMUNITY)
Admission: RE | Admit: 2021-09-20 | Discharge: 2021-09-20 | Disposition: A | Discharge status: Home / Self Care | Payer: Medicare Other | Source: Ambulatory Visit | Attending: Neurosurgery | Admitting: Neurosurgery

## 2021-09-20 ENCOUNTER — Ambulatory Visit: Payer: Medicare Other

## 2021-09-20 VITALS — BP 120/82 | HR 82 | Temp 98.8°F | Resp 18 | Ht 66.0 in | Wt 282.7 lb

## 2021-09-20 DIAGNOSIS — Z01812 Encounter for preprocedural laboratory examination: Secondary | ICD-10-CM | POA: Insufficient documentation

## 2021-09-20 DIAGNOSIS — M48061 Spinal stenosis, lumbar region without neurogenic claudication: Secondary | ICD-10-CM | POA: Diagnosis not present

## 2021-09-20 DIAGNOSIS — Z01818 Encounter for other preprocedural examination: Secondary | ICD-10-CM

## 2021-09-20 DIAGNOSIS — Z981 Arthrodesis status: Secondary | ICD-10-CM | POA: Diagnosis not present

## 2021-09-20 DIAGNOSIS — E782 Mixed hyperlipidemia: Secondary | ICD-10-CM | POA: Insufficient documentation

## 2021-09-20 DIAGNOSIS — M4316 Spondylolisthesis, lumbar region: Secondary | ICD-10-CM | POA: Diagnosis not present

## 2021-09-20 DIAGNOSIS — M47896 Other spondylosis, lumbar region: Secondary | ICD-10-CM | POA: Diagnosis not present

## 2021-09-20 DIAGNOSIS — G4733 Obstructive sleep apnea (adult) (pediatric): Secondary | ICD-10-CM | POA: Diagnosis not present

## 2021-09-20 DIAGNOSIS — M4326 Fusion of spine, lumbar region: Secondary | ICD-10-CM | POA: Diagnosis not present

## 2021-09-20 DIAGNOSIS — Z6841 Body Mass Index (BMI) 40.0 and over, adult: Secondary | ICD-10-CM | POA: Diagnosis not present

## 2021-09-20 DIAGNOSIS — Z880 Allergy status to penicillin: Secondary | ICD-10-CM | POA: Diagnosis not present

## 2021-09-20 DIAGNOSIS — Z853 Personal history of malignant neoplasm of breast: Secondary | ICD-10-CM | POA: Diagnosis not present

## 2021-09-20 DIAGNOSIS — E119 Type 2 diabetes mellitus without complications: Secondary | ICD-10-CM | POA: Diagnosis not present

## 2021-09-20 DIAGNOSIS — Z8249 Family history of ischemic heart disease and other diseases of the circulatory system: Secondary | ICD-10-CM | POA: Diagnosis not present

## 2021-09-20 DIAGNOSIS — F32A Depression, unspecified: Secondary | ICD-10-CM | POA: Diagnosis not present

## 2021-09-20 DIAGNOSIS — M47816 Spondylosis without myelopathy or radiculopathy, lumbar region: Secondary | ICD-10-CM | POA: Diagnosis not present

## 2021-09-20 DIAGNOSIS — I10 Essential (primary) hypertension: Secondary | ICD-10-CM | POA: Diagnosis not present

## 2021-09-20 DIAGNOSIS — Z888 Allergy status to other drugs, medicaments and biological substances status: Secondary | ICD-10-CM | POA: Diagnosis not present

## 2021-09-20 LAB — BASIC METABOLIC PANEL
Anion gap: 5 (ref 5–15)
BUN: 15 mg/dL (ref 8–23)
CO2: 30 mmol/L (ref 22–32)
Calcium: 9.4 mg/dL (ref 8.9–10.3)
Chloride: 103 mmol/L (ref 98–111)
Creatinine, Ser: 1.03 mg/dL — ABNORMAL HIGH (ref 0.44–1.00)
GFR, Estimated: 60 mL/min — ABNORMAL LOW (ref >60)
Glucose, Bld: 117 mg/dL — ABNORMAL HIGH (ref 70–99)
Potassium: 4 mmol/L (ref 3.5–5.1)
Sodium: 138 mmol/L (ref 135–145)

## 2021-09-20 LAB — CBC
HCT: 40.1 % (ref 36.0–46.0)
Hemoglobin: 12.9 g/dL (ref 12.0–15.0)
MCH: 29.2 pg (ref 26.0–34.0)
MCHC: 32.2 g/dL (ref 30.0–36.0)
MCV: 90.7 fL (ref 80.0–100.0)
Platelets: 252 10*3/uL (ref 150–400)
RBC: 4.42 MIL/uL (ref 3.87–5.11)
RDW: 14.4 % (ref 11.5–15.5)
WBC: 7.4 10*3/uL (ref 4.0–10.5)
nRBC: 0 % (ref 0.0–0.2)

## 2021-09-20 LAB — TYPE AND SCREEN
ABO/RH(D): O POS
Antibody Screen: NEGATIVE

## 2021-09-20 LAB — SURGICAL PCR SCREEN
MRSA, PCR: NEGATIVE
Staphylococcus aureus: NEGATIVE

## 2021-09-20 NOTE — Progress Notes (Signed)
PCP - Willette Alma, MD ?Cardiologist - Denies ? ?Chest x-ray - 12/10/19 ?EKG - 01/21/21 ?Stress Test - "It's been a long time, everything was fine" ?ECHO - 12/11/11 ?Cardiac Cath - Denies ? ?Sleep Study - Yes has OSA ?CPAP - Nightly ? ?Prediabetic ?Does not check blood sugars ? ?Blood Thinner Instructions:Denies ?Aspirin Instructions:Denies ? ?Anesthesia review: NO ? ?Patient denies shortness of breath, fever, cough and chest pain at PAT appointment ? ? ?All instructions explained to the patient, with a verbal understanding of the material. Patient agrees to go over the instructions while at home for a better understanding.  The opportunity to ask questions was provided. ? ? ?

## 2021-09-21 ENCOUNTER — Ambulatory Visit: Payer: Medicare Other

## 2021-09-21 ENCOUNTER — Encounter (HOSPITAL_COMMUNITY)
Admission: RE | Disposition: A | Discharge status: Home / Self Care | Payer: Self-pay | Source: Home / Self Care | Attending: Neurosurgery

## 2021-09-21 ENCOUNTER — Inpatient Hospital Stay (HOSPITAL_COMMUNITY): Payer: Medicare Other

## 2021-09-21 ENCOUNTER — Other Ambulatory Visit: Payer: Self-pay

## 2021-09-21 ENCOUNTER — Inpatient Hospital Stay (HOSPITAL_COMMUNITY)
Admission: RE | Admit: 2021-09-21 | Discharge: 2021-09-24 | DRG: 460 | Disposition: A | Discharge status: Home Health | Payer: Medicare Other | Attending: Neurosurgery | Admitting: Neurosurgery

## 2021-09-21 ENCOUNTER — Encounter (HOSPITAL_COMMUNITY): Payer: Self-pay | Admitting: Neurosurgery

## 2021-09-21 DIAGNOSIS — Z8249 Family history of ischemic heart disease and other diseases of the circulatory system: Secondary | ICD-10-CM

## 2021-09-21 DIAGNOSIS — M25571 Pain in right ankle and joints of right foot: Secondary | ICD-10-CM

## 2021-09-21 DIAGNOSIS — M47896 Other spondylosis, lumbar region: Secondary | ICD-10-CM | POA: Diagnosis not present

## 2021-09-21 DIAGNOSIS — M47816 Spondylosis without myelopathy or radiculopathy, lumbar region: Secondary | ICD-10-CM | POA: Diagnosis present

## 2021-09-21 DIAGNOSIS — M48061 Spinal stenosis, lumbar region without neurogenic claudication: Secondary | ICD-10-CM | POA: Diagnosis present

## 2021-09-21 DIAGNOSIS — E119 Type 2 diabetes mellitus without complications: Secondary | ICD-10-CM | POA: Diagnosis present

## 2021-09-21 DIAGNOSIS — I10 Essential (primary) hypertension: Secondary | ICD-10-CM

## 2021-09-21 DIAGNOSIS — Z6841 Body Mass Index (BMI) 40.0 and over, adult: Secondary | ICD-10-CM

## 2021-09-21 DIAGNOSIS — Z888 Allergy status to other drugs, medicaments and biological substances status: Secondary | ICD-10-CM

## 2021-09-21 DIAGNOSIS — M4316 Spondylolisthesis, lumbar region: Secondary | ICD-10-CM

## 2021-09-21 DIAGNOSIS — R609 Edema, unspecified: Secondary | ICD-10-CM

## 2021-09-21 DIAGNOSIS — M5030 Other cervical disc degeneration, unspecified cervical region: Secondary | ICD-10-CM

## 2021-09-21 DIAGNOSIS — Z853 Personal history of malignant neoplasm of breast: Secondary | ICD-10-CM | POA: Diagnosis not present

## 2021-09-21 DIAGNOSIS — G4733 Obstructive sleep apnea (adult) (pediatric): Secondary | ICD-10-CM | POA: Diagnosis not present

## 2021-09-21 DIAGNOSIS — Z880 Allergy status to penicillin: Secondary | ICD-10-CM

## 2021-09-21 DIAGNOSIS — Z981 Arthrodesis status: Secondary | ICD-10-CM | POA: Diagnosis not present

## 2021-09-21 DIAGNOSIS — R5383 Other fatigue: Secondary | ICD-10-CM

## 2021-09-21 DIAGNOSIS — F32A Depression, unspecified: Secondary | ICD-10-CM | POA: Diagnosis not present

## 2021-09-21 DIAGNOSIS — M4326 Fusion of spine, lumbar region: Secondary | ICD-10-CM | POA: Diagnosis not present

## 2021-09-21 HISTORY — DX: Spondylolisthesis, lumbar region: M43.16

## 2021-09-21 LAB — GLUCOSE, CAPILLARY
Glucose-Capillary: 115 mg/dL — ABNORMAL HIGH (ref 70–99)
Glucose-Capillary: 182 mg/dL — ABNORMAL HIGH (ref 70–99)

## 2021-09-21 LAB — ABO/RH: ABO/RH(D): O POS

## 2021-09-21 SURGERY — POSTERIOR LUMBAR FUSION 2 LEVEL
Anesthesia: General | Site: Spine Lumbar

## 2021-09-21 MED ORDER — PHENYLEPHRINE HCL-NACL 20-0.9 MG/250ML-% IV SOLN
INTRAVENOUS | Status: DC | PRN
Start: 2021-09-21 — End: 2021-09-21
  Administered 2021-09-21: 50 ug/min via INTRAVENOUS

## 2021-09-21 MED ORDER — PHENOL 1.4 % MT LIQD: 1.0000 | OROMUCOSAL | Status: DC | PRN

## 2021-09-21 MED ORDER — ACETAMINOPHEN 10 MG/ML IV SOLN: 1000.0000 mg | Freq: Once | INTRAVENOUS | Status: DC | PRN

## 2021-09-21 MED ORDER — DEXAMETHASONE SODIUM PHOSPHATE 10 MG/ML IJ SOLN
INTRAMUSCULAR | Status: DC | PRN
  Administered 2021-09-21: 10 mg via INTRAVENOUS

## 2021-09-21 MED ORDER — ALBUTEROL SULFATE (2.5 MG/3ML) 0.083% IN NEBU: 2.5000 mg | INHALATION_SOLUTION | Freq: Four times a day (QID) | RESPIRATORY_TRACT | Status: DC | PRN

## 2021-09-21 MED ORDER — HYDROMORPHONE HCL 1 MG/ML IJ SOLN
INTRAMUSCULAR | Status: DC | PRN
  Administered 2021-09-21 (×2): .5 mg via INTRAVENOUS

## 2021-09-21 MED ORDER — LIDOCAINE 2% (20 MG/ML) 5 ML SYRINGE
INTRAMUSCULAR | Status: DC | PRN
  Administered 2021-09-21: 60 mg via INTRAVENOUS

## 2021-09-21 MED ORDER — SENNOSIDES-DOCUSATE SODIUM 8.6-50 MG PO TABS: 1.0000 | ORAL_TABLET | Freq: Every evening | ORAL | Status: DC | PRN

## 2021-09-21 MED ORDER — MENTHOL 3 MG MT LOZG: 1.0000 | LOZENGE | OROMUCOSAL | Status: DC | PRN

## 2021-09-21 MED ORDER — FUROSEMIDE 40 MG PO TABS
40.0000 mg | ORAL_TABLET | Freq: Every day | ORAL | Status: DC
  Administered 2021-09-22 – 2021-09-24 (×3): 40 mg via ORAL
  Filled 2021-09-21 (×3): qty 1

## 2021-09-21 MED ORDER — HYDROMORPHONE HCL 1 MG/ML IJ SOLN
INTRAMUSCULAR | Status: AC
  Filled 2021-09-21: qty 0.5

## 2021-09-21 MED ORDER — VANCOMYCIN HCL IN DEXTROSE 1-5 GM/200ML-% IV SOLN
1000.0000 mg | Freq: Once | INTRAVENOUS | Status: AC
Start: 2021-09-22 — End: 2021-09-22
  Administered 2021-09-22: 1000 mg via INTRAVENOUS
  Filled 2021-09-21: qty 200

## 2021-09-21 MED ORDER — ONDANSETRON HCL 4 MG/2ML IJ SOLN: 4.0000 mg | Freq: Four times a day (QID) | INTRAMUSCULAR | Status: DC | PRN

## 2021-09-21 MED ORDER — SENNA 8.6 MG PO TABS
1.0000 | ORAL_TABLET | Freq: Two times a day (BID) | ORAL | Status: DC
  Administered 2021-09-21 – 2021-09-24 (×6): 8.6 mg via ORAL
  Filled 2021-09-21 (×6): qty 1

## 2021-09-21 MED ORDER — FENTANYL CITRATE (PF) 250 MCG/5ML IJ SOLN
INTRAMUSCULAR | Status: AC
  Filled 2021-09-21: qty 5

## 2021-09-21 MED ORDER — LACTATED RINGERS IV SOLN: INTRAVENOUS | Status: DC

## 2021-09-21 MED ORDER — DEXAMETHASONE SODIUM PHOSPHATE 10 MG/ML IJ SOLN
INTRAMUSCULAR | Status: AC
  Filled 2021-09-21: qty 1

## 2021-09-21 MED ORDER — METFORMIN HCL 500 MG PO TABS
500.0000 mg | ORAL_TABLET | Freq: Two times a day (BID) | ORAL | Status: DC
  Administered 2021-09-22 – 2021-09-24 (×5): 500 mg via ORAL
  Filled 2021-09-21 (×5): qty 1

## 2021-09-21 MED ORDER — ZOLPIDEM TARTRATE 5 MG PO TABS
5.0000 mg | ORAL_TABLET | Freq: Every evening | ORAL | Status: DC | PRN
  Administered 2021-09-22 (×2): 5 mg via ORAL
  Filled 2021-09-21 (×2): qty 1

## 2021-09-21 MED ORDER — THROMBIN 20000 UNITS EX SOLR
CUTANEOUS | Status: AC
  Filled 2021-09-21: qty 20000

## 2021-09-21 MED ORDER — BUPIVACAINE-EPINEPHRINE 0.5% -1:200000 IJ SOLN
INTRAMUSCULAR | Status: AC
  Filled 2021-09-21: qty 1

## 2021-09-21 MED ORDER — BUPIVACAINE HCL (PF) 0.5 % IJ SOLN
INTRAMUSCULAR | Status: DC | PRN
  Administered 2021-09-21: 30 mL

## 2021-09-21 MED ORDER — VITAMIN D 25 MCG (1000 UNIT) PO TABS
1000.0000 [IU] | ORAL_TABLET | Freq: Every day | ORAL | Status: DC
  Administered 2021-09-22 – 2021-09-24 (×3): 1000 [IU] via ORAL
  Filled 2021-09-21 (×3): qty 1

## 2021-09-21 MED ORDER — FENTANYL CITRATE (PF) 100 MCG/2ML IJ SOLN
25.0000 ug | INTRAMUSCULAR | Status: DC | PRN
  Administered 2021-09-21: 50 ug via INTRAVENOUS

## 2021-09-21 MED ORDER — ACETAMINOPHEN 325 MG PO TABS: 650.0000 mg | ORAL_TABLET | ORAL | Status: DC | PRN

## 2021-09-21 MED ORDER — FENTANYL CITRATE (PF) 250 MCG/5ML IJ SOLN
INTRAMUSCULAR | Status: DC | PRN
  Administered 2021-09-21: 50 ug via INTRAVENOUS
  Administered 2021-09-21: 150 ug via INTRAVENOUS
  Administered 2021-09-21 (×6): 50 ug via INTRAVENOUS

## 2021-09-21 MED ORDER — ZINC SULFATE 220 (50 ZN) MG PO CAPS
220.0000 mg | ORAL_CAPSULE | Freq: Every day | ORAL | Status: DC
  Administered 2021-09-22 – 2021-09-24 (×3): 220 mg via ORAL
  Filled 2021-09-21 (×3): qty 1

## 2021-09-21 MED ORDER — MORPHINE SULFATE (PF) 2 MG/ML IV SOLN: 2.0000 mg | INTRAVENOUS | Status: DC | PRN

## 2021-09-21 MED ORDER — OXYCODONE HCL 5 MG/5ML PO SOLN: 5.0000 mg | Freq: Once | ORAL | Status: DC | PRN

## 2021-09-21 MED ORDER — SODIUM CHLORIDE 0.9% FLUSH
3.0000 mL | Freq: Two times a day (BID) | INTRAVENOUS | Status: DC
  Administered 2021-09-21 – 2021-09-23 (×5): 3 mL via INTRAVENOUS

## 2021-09-21 MED ORDER — CALCIUM CARBONATE 1250 (500 CA) MG PO TABS
1.0000 | ORAL_TABLET | Freq: Two times a day (BID) | ORAL | Status: DC
  Administered 2021-09-22 – 2021-09-24 (×5): 500 mg via ORAL
  Filled 2021-09-21 (×5): qty 1

## 2021-09-21 MED ORDER — IRBESARTAN 75 MG PO TABS
75.0000 mg | ORAL_TABLET | Freq: Every day | ORAL | Status: DC
  Administered 2021-09-22 – 2021-09-24 (×3): 75 mg via ORAL
  Filled 2021-09-21 (×3): qty 1

## 2021-09-21 MED ORDER — LIDOCAINE-EPINEPHRINE 0.5 %-1:200000 IJ SOLN
INTRAMUSCULAR | Status: AC
  Filled 2021-09-21: qty 1

## 2021-09-21 MED ORDER — VANCOMYCIN HCL 1000 MG IV SOLR
INTRAVENOUS | Status: AC
  Filled 2021-09-21: qty 20

## 2021-09-21 MED ORDER — SODIUM CHLORIDE 0.9% FLUSH: 3.0000 mL | INTRAVENOUS | Status: DC | PRN

## 2021-09-21 MED ORDER — BISACODYL 5 MG PO TBEC: 5.0000 mg | DELAYED_RELEASE_TABLET | Freq: Every day | ORAL | Status: DC | PRN

## 2021-09-21 MED ORDER — PROPOFOL 10 MG/ML IV BOLUS
INTRAVENOUS | Status: AC
  Filled 2021-09-21: qty 20

## 2021-09-21 MED ORDER — POTASSIUM CHLORIDE IN NACL 20-0.9 MEQ/L-% IV SOLN
INTRAVENOUS | Status: DC
  Filled 2021-09-21: qty 1000

## 2021-09-21 MED ORDER — MIDAZOLAM HCL 2 MG/2ML IJ SOLN
INTRAMUSCULAR | Status: AC
  Filled 2021-09-21: qty 2

## 2021-09-21 MED ORDER — SUGAMMADEX SODIUM 200 MG/2ML IV SOLN
INTRAVENOUS | Status: DC | PRN
Start: 2021-09-21 — End: 2021-09-21
  Administered 2021-09-21: 300 mg via INTRAVENOUS

## 2021-09-21 MED ORDER — CELECOXIB 200 MG PO CAPS
200.0000 mg | ORAL_CAPSULE | Freq: Two times a day (BID) | ORAL | Status: DC
  Administered 2021-09-21 – 2021-09-24 (×6): 200 mg via ORAL
  Filled 2021-09-21 (×6): qty 1

## 2021-09-21 MED ORDER — FLUTICASONE PROPIONATE 50 MCG/ACT NA SUSP: 2.0000 | Freq: Every day | NASAL | Status: DC | PRN

## 2021-09-21 MED ORDER — OXYCODONE HCL 5 MG PO TABS
10.0000 mg | ORAL_TABLET | ORAL | Status: DC | PRN
  Administered 2021-09-22 (×2): 10 mg via ORAL
  Filled 2021-09-21 (×2): qty 2

## 2021-09-21 MED ORDER — CHLORHEXIDINE GLUCONATE 0.12 % MT SOLN
15.0000 mL | Freq: Once | OROMUCOSAL | Status: AC
  Administered 2021-09-21: 15 mL via OROMUCOSAL
  Filled 2021-09-21: qty 15

## 2021-09-21 MED ORDER — ONDANSETRON HCL 4 MG/2ML IJ SOLN
INTRAMUSCULAR | Status: AC
  Filled 2021-09-21: qty 2

## 2021-09-21 MED ORDER — HYDROCHLOROTHIAZIDE 12.5 MG PO TABS
12.5000 mg | ORAL_TABLET | Freq: Every day | ORAL | Status: DC
Start: 2021-09-22 — End: 2021-09-24
  Administered 2021-09-22 – 2021-09-24 (×3): 12.5 mg via ORAL
  Filled 2021-09-21 (×3): qty 1

## 2021-09-21 MED ORDER — OXYCODONE HCL 5 MG PO TABS
5.0000 mg | ORAL_TABLET | ORAL | Status: DC | PRN
  Administered 2021-09-23 (×3): 5 mg via ORAL
  Filled 2021-09-21 (×3): qty 1

## 2021-09-21 MED ORDER — ALBUMIN HUMAN 5 % IV SOLN: INTRAVENOUS | Status: DC | PRN

## 2021-09-21 MED ORDER — 0.9 % SODIUM CHLORIDE (POUR BTL) OPTIME
TOPICAL | Status: DC | PRN
  Administered 2021-09-21 (×2): 1000 mL

## 2021-09-21 MED ORDER — ONDANSETRON HCL 4 MG PO TABS: 4.0000 mg | ORAL_TABLET | Freq: Four times a day (QID) | ORAL | Status: DC | PRN

## 2021-09-21 MED ORDER — MIDAZOLAM HCL 2 MG/2ML IJ SOLN
INTRAMUSCULAR | Status: DC | PRN
  Administered 2021-09-21: 2 mg via INTRAVENOUS

## 2021-09-21 MED ORDER — BUPROPION HCL ER (SR) 150 MG PO TB12
150.0000 mg | ORAL_TABLET | Freq: Every day | ORAL | Status: DC
Start: 2021-09-22 — End: 2021-09-24
  Administered 2021-09-22 – 2021-09-24 (×3): 150 mg via ORAL
  Filled 2021-09-21 (×3): qty 1

## 2021-09-21 MED ORDER — ORAL CARE MOUTH RINSE: 15.0000 mL | Freq: Once | OROMUCOSAL | Status: AC

## 2021-09-21 MED ORDER — BUPIVACAINE HCL (PF) 0.5 % IJ SOLN
INTRAMUSCULAR | Status: AC
  Filled 2021-09-21: qty 30

## 2021-09-21 MED ORDER — ONDANSETRON HCL 4 MG/2ML IJ SOLN
INTRAMUSCULAR | Status: DC | PRN
  Administered 2021-09-21: 4 mg via INTRAVENOUS

## 2021-09-21 MED ORDER — THROMBIN 20000 UNITS EX SOLR
CUTANEOUS | Status: DC | PRN
  Administered 2021-09-21: 20 mL via TOPICAL

## 2021-09-21 MED ORDER — LIDOCAINE 2% (20 MG/ML) 5 ML SYRINGE
INTRAMUSCULAR | Status: AC
  Filled 2021-09-21: qty 5

## 2021-09-21 MED ORDER — DIAZEPAM 5 MG PO TABS: 5.0000 mg | ORAL_TABLET | Freq: Four times a day (QID) | ORAL | Status: DC | PRN

## 2021-09-21 MED ORDER — ACETAMINOPHEN 500 MG PO TABS: 1000.0000 mg | ORAL_TABLET | Freq: Once | ORAL | Status: DC | PRN

## 2021-09-21 MED ORDER — ACETAMINOPHEN 650 MG RE SUPP: 650.0000 mg | RECTAL | Status: DC | PRN

## 2021-09-21 MED ORDER — SODIUM CHLORIDE 0.9 % IV SOLN
INTRAVENOUS | Status: DC | PRN
  Administered 2021-09-21: 1000 mg via INTRAVENOUS

## 2021-09-21 MED ORDER — ROCURONIUM BROMIDE 10 MG/ML (PF) SYRINGE
PREFILLED_SYRINGE | INTRAVENOUS | Status: DC | PRN
  Administered 2021-09-21: 20 mg via INTRAVENOUS
  Administered 2021-09-21 (×2): 50 mg via INTRAVENOUS
  Administered 2021-09-21: 80 mg via INTRAVENOUS

## 2021-09-21 MED ORDER — ACETAMINOPHEN 10 MG/ML IV SOLN
INTRAVENOUS | Status: AC
  Filled 2021-09-21: qty 100

## 2021-09-21 MED ORDER — ADULT MULTIVITAMIN LIQUID CH
15.0000 mL | Freq: Every day | ORAL | Status: DC
  Administered 2021-09-22 – 2021-09-24 (×3): 15 mL via ORAL
  Filled 2021-09-21 (×3): qty 15

## 2021-09-21 MED ORDER — OXYCODONE HCL 5 MG PO TABS: 5.0000 mg | ORAL_TABLET | Freq: Once | ORAL | Status: DC | PRN

## 2021-09-21 MED ORDER — SODIUM CHLORIDE 0.9 % IV SOLN: 250.0000 mL | INTRAVENOUS | Status: DC

## 2021-09-21 MED ORDER — FENTANYL CITRATE (PF) 100 MCG/2ML IJ SOLN
INTRAMUSCULAR | Status: AC
  Filled 2021-09-21: qty 2

## 2021-09-21 MED ORDER — ROCURONIUM BROMIDE 10 MG/ML (PF) SYRINGE
PREFILLED_SYRINGE | INTRAVENOUS | Status: AC
  Filled 2021-09-21: qty 10

## 2021-09-21 MED ORDER — KETAMINE HCL 50 MG/5ML IJ SOSY
PREFILLED_SYRINGE | INTRAMUSCULAR | Status: AC
  Filled 2021-09-21: qty 10

## 2021-09-21 MED ORDER — FLEET ENEMA 7-19 GM/118ML RE ENEM: 1.0000 | ENEMA | Freq: Once | RECTAL | Status: DC | PRN

## 2021-09-21 MED ORDER — KETAMINE HCL 10 MG/ML IJ SOLN
INTRAMUSCULAR | Status: DC | PRN
  Administered 2021-09-21: 10 mg via INTRAVENOUS
  Administered 2021-09-21: 20 mg via INTRAVENOUS
  Administered 2021-09-21: 30 mg via INTRAVENOUS
  Administered 2021-09-21: 10 mg via INTRAVENOUS
  Administered 2021-09-21: 20 mg via INTRAVENOUS
  Administered 2021-09-21: 10 mg via INTRAVENOUS

## 2021-09-21 MED ORDER — SERTRALINE HCL 50 MG PO TABS
25.0000 mg | ORAL_TABLET | Freq: Every day | ORAL | Status: DC
Start: 2021-09-22 — End: 2021-09-24
  Administered 2021-09-22 – 2021-09-24 (×3): 25 mg via ORAL
  Filled 2021-09-21 (×3): qty 1

## 2021-09-21 MED ORDER — OXYCODONE HCL ER 15 MG PO T12A
15.0000 mg | EXTENDED_RELEASE_TABLET | Freq: Two times a day (BID) | ORAL | Status: DC
  Administered 2021-09-21 – 2021-09-24 (×6): 15 mg via ORAL
  Filled 2021-09-21 (×6): qty 1

## 2021-09-21 MED ORDER — ACETAMINOPHEN 160 MG/5ML PO SOLN: 1000.0000 mg | Freq: Once | ORAL | Status: DC | PRN

## 2021-09-21 MED ORDER — PHENYLEPHRINE 80 MCG/ML (10ML) SYRINGE FOR IV PUSH (FOR BLOOD PRESSURE SUPPORT)
PREFILLED_SYRINGE | INTRAVENOUS | Status: DC | PRN
  Administered 2021-09-21: 80 ug via INTRAVENOUS

## 2021-09-21 MED ORDER — ESMOLOL HCL 100 MG/10ML IV SOLN
INTRAVENOUS | Status: AC
  Filled 2021-09-21: qty 10

## 2021-09-21 MED ORDER — FAMOTIDINE 20 MG PO TABS
40.0000 mg | ORAL_TABLET | Freq: Every day | ORAL | Status: DC
  Administered 2021-09-22 – 2021-09-24 (×3): 40 mg via ORAL
  Filled 2021-09-21 (×3): qty 2

## 2021-09-21 MED ORDER — PROPOFOL 10 MG/ML IV BOLUS
INTRAVENOUS | Status: DC | PRN
  Administered 2021-09-21: 80 mg via INTRAVENOUS
  Administered 2021-09-21: 120 mg via INTRAVENOUS

## 2021-09-21 SURGICAL SUPPLY — 52 items
ADH SKN CLS APL DERMABOND .7 (GAUZE/BANDAGES/DRESSINGS) ×1
BAG COUNTER SPONGE SURGICOUNT (BAG) ×2 IMPLANT
BAG SPNG CNTER NS LX DISP (BAG) ×1
BASKET BONE COLLECTION (BASKET) ×1 IMPLANT
BIT DRILL PLIF MAS DISP 5.5MM (DRILL) IMPLANT
BUR MATCHSTICK NEURO 3.0 LAGG (BURR) ×2 IMPLANT
BUR PRECISION FLUTE 5.0 (BURR) ×2 IMPLANT
CANISTER SUCT 3000ML PPV (MISCELLANEOUS) ×2 IMPLANT
CAP RELINE MOD TULIP RMM (Cap) ×6 IMPLANT
CARTRIDGE OIL MAESTRO DRILL (MISCELLANEOUS) ×1 IMPLANT
CNTNR URN SCR LID CUP LEK RST (MISCELLANEOUS) ×1 IMPLANT
CONT SPEC 4OZ STRL OR WHT (MISCELLANEOUS) ×2
COVER BACK TABLE 60X90IN (DRAPES) ×1 IMPLANT
DERMABOND ADVANCED (GAUZE/BANDAGES/DRESSINGS) ×1
DERMABOND ADVANCED .7 DNX12 (GAUZE/BANDAGES/DRESSINGS) ×1 IMPLANT
DIFFUSER DRILL AIR PNEUMATIC (MISCELLANEOUS) ×2 IMPLANT
DRAPE C-ARM 42X72 X-RAY (DRAPES) ×4 IMPLANT
DRAPE LAPAROTOMY 100X72X124 (DRAPES) ×2 IMPLANT
DRAPE SURG 17X23 STRL (DRAPES) ×2 IMPLANT
DRILL PLIF MAS DISP 5.5MM (DRILL) ×2
DURAPREP 26ML APPLICATOR (WOUND CARE) ×2 IMPLANT
ELECT REM PT RETURN 9FT ADLT (ELECTROSURGICAL) ×2
ELECTRODE REM PT RTRN 9FT ADLT (ELECTROSURGICAL) ×1 IMPLANT
GLOVE BIO SURGEON STRL SZ8 (GLOVE) ×2 IMPLANT
GLOVE ECLIPSE 6.5 STRL STRAW (GLOVE) ×5 IMPLANT
GOWN STRL REUS W/ TWL LRG LVL3 (GOWN DISPOSABLE) ×2 IMPLANT
GOWN STRL REUS W/ TWL XL LVL3 (GOWN DISPOSABLE) IMPLANT
GOWN STRL REUS W/TWL LRG LVL3 (GOWN DISPOSABLE) ×6
GOWN STRL REUS W/TWL XL LVL3 (GOWN DISPOSABLE) ×4
KIT BASIN OR (CUSTOM PROCEDURE TRAY) ×2 IMPLANT
KIT POSITION SURG JACKSON T1 (MISCELLANEOUS) ×2 IMPLANT
KIT TURNOVER KIT B (KITS) ×2 IMPLANT
NDL HYPO 25X1 1.5 SAFETY (NEEDLE) ×1 IMPLANT
NDL SPNL 18GX3.5 QUINCKE PK (NEEDLE) IMPLANT
NEEDLE HYPO 25X1 1.5 SAFETY (NEEDLE) ×2 IMPLANT
NEEDLE SPNL 18GX3.5 QUINCKE PK (NEEDLE) ×2 IMPLANT
NS IRRIG 1000ML POUR BTL (IV SOLUTION) ×3 IMPLANT
OIL CARTRIDGE MAESTRO DRILL (MISCELLANEOUS) ×2
PACK LAMINECTOMY NEURO (CUSTOM PROCEDURE TRAY) ×2 IMPLANT
ROD RELINE-O COCR 5.0X55MM (Rod) ×2 IMPLANT
SCREW LOCK RSS 4.5/5.0MM (Screw) ×6 IMPLANT
SCREW SHANK RELINE 6.5X40MM (Screw) ×2 IMPLANT
SCREW SHANK RELINE MOD 6.5X35 (Screw) ×4 IMPLANT
SPACER EXP PROLIFT 8X28X10 15D (Cage) ×4 IMPLANT
SPONGE SURGIFOAM ABS GEL 100 (HEMOSTASIS) ×2 IMPLANT
SUT VIC AB 0 CT1 18XCR BRD8 (SUTURE) ×1 IMPLANT
SUT VIC AB 0 CT1 8-18 (SUTURE) ×2
SUT VIC AB 2-0 CT1 18 (SUTURE) ×2 IMPLANT
SUT VIC AB 3-0 SH 8-18 (SUTURE) ×2 IMPLANT
TOWEL GREEN STERILE (TOWEL DISPOSABLE) ×2 IMPLANT
TOWEL GREEN STERILE FF (TOWEL DISPOSABLE) ×2 IMPLANT
WATER STERILE IRR 1000ML POUR (IV SOLUTION) ×2 IMPLANT

## 2021-09-21 NOTE — Transfer of Care (Signed)
Immediate Anesthesia Transfer of Care Note ? ?Patient: [name] ? ?Procedure(s) Performed: LUMBAR THREE-FOUR, LUMBAR FOUR-FIVE POSTERIOR LUMBAR INTERBODY FUSION (Spine Lumbar) ? ?Patient Location: PACU ? ?Anesthesia Type:General ? ?Level of Consciousness: drowsy and responds to stimulation ? ?Airway & Oxygen Therapy: Patient Spontanous Breathing and Patient connected to face mask oxygen ? ?Post-op Assessment: Report given to RN, Post -op Vital signs reviewed and stable and Patient moving all extremities X 4 ? ?Post vital signs: Reviewed and stable ? ?Last Vitals:  ?Vitals Value Taken Time  ?BP 121/78 09/21/21 2118  ?Temp    ?Pulse 110 09/21/21 2123  ?Resp 16 09/21/21 2123  ?SpO2 100 % 09/21/21 2123  ?Vitals shown include unvalidated device data. ? ?Last Pain:  ?Vitals:  ? 09/21/21 1102  ?TempSrc:   ?PainSc: 5   ?   ? ?Patients Stated Pain Goal: 2 (09/21/21 1102) ? ?Complications: No notable events documented. ?

## 2021-09-21 NOTE — Anesthesia Procedure Notes (Signed)
Procedure Name: Intubation ?Date/Time: 09/21/2021 1:16 PM ?Performed by: Lance Coon, CRNA ?Pre-anesthesia Checklist: Patient identified, Emergency Drugs available, Suction available and Patient being monitored ?Patient Re-evaluated:Patient Re-evaluated prior to induction ?Oxygen Delivery Method: Circle System Utilized ?Preoxygenation: Pre-oxygenation with 100% oxygen ?Induction Type: IV induction ?Ventilation: Mask ventilation without difficulty and Oral airway inserted - appropriate to patient size ?Laryngoscope Size: Mac and 4 ?Grade View: Grade II ?Tube type: Oral ?Tube size: 7.0 mm ?Number of attempts: 1 ?Airway Equipment and Method: Stylet and Oral airway ?Placement Confirmation: ETT inserted through vocal cords under direct vision, positive ETCO2 and breath sounds checked- equal and bilateral ?Secured at: 22 cm ?Tube secured with: Tape ?Dental Injury: Teeth and Oropharynx as per pre-operative assessment  ?Comments: Intubated by Everlene Other SRNA ? ? ? ? ?

## 2021-09-21 NOTE — Progress Notes (Signed)
Orthopedic Tech Progress Note ?Patient Details:  ?[name] ?05-22-1956 ?[id number] ? ?Ortho Devices ?Type of Ortho Device: Lumbar corsett ?Ortho Device/Splint Interventions: Ordered ?  ?  ?Dropped LSO off in room, RN and pt aware. ? ?[place] ?09/21/2021, 11:44 PM ? ?

## 2021-09-21 NOTE — Anesthesia Preprocedure Evaluation (Signed)
Anesthesia Evaluation  ?Patient identified by MRN, date of birth, ID band ?Patient awake ? ? ? ?Reviewed: ?Allergy & Precautions, NPO status , Patient's Chart, lab work & pertinent test results ? ?History of Anesthesia Complications ?(+) PONV and history of anesthetic complications ? ?Airway ?Mallampati: IV ? ?TM Distance: >3 FB ?Neck ROM: Full ? ?Mouth opening: Limited Mouth Opening ? Dental ? ?(+) Teeth Intact, Dental Advisory Given ?  ?Pulmonary ?asthma , sleep apnea and Continuous Positive Airway Pressure Ventilation ,  ?  ?breath sounds clear to auscultation ? ? ? ? ? ? Cardiovascular ?hypertension, Pt. on medications ?(-) angina(-) Past MI and (-) CHF  ?Rhythm:Regular  ? ?  ?Neuro/Psych ?PSYCHIATRIC DISORDERS Anxiety Depression  Neuromuscular disease   ? GI/Hepatic ?Neg liver ROS, GERD  Medicated and Controlled,  ?Endo/Other  ?diabetesMorbid obesityLab Results ?     Component                Value               Date                 ?     HGBA1C                   6.6 (H)             04/08/2021           ? ? Renal/GU ?negative Renal ROS  ? ?  ?Musculoskeletal ? ?(+) Arthritis , Fibromyalgia - ? Abdominal ?  ?Peds ? Hematology ?negative hematology ROS ?(+) Lab Results ?     Component                Value               Date                 ?     WBC                      7.4                 09/20/2021           ?     HGB                      12.9                09/20/2021           ?     HCT                      40.1                09/20/2021           ?     MCV                      90.7                09/20/2021           ?     PLT                      252                 09/20/2021           ?   ?  Anesthesia Other Findings ? ? Reproductive/Obstetrics ? ?  ? ? ? ? ? ? ? ? ? ? ? ? ? ?  ?  ? ? ? ? ? ? ? ? ?Anesthesia Physical ?Anesthesia Plan ? ?ASA: 3 ? ?Anesthesia Plan: General  ? ?Post-op Pain Management: Ketamine IV* and Ofirmev IV (intra-op)*  ? ?Induction:  ? ?PONV Risk Score  and Plan: 4 or greater and Ondansetron and Dexamethasone ? ?Airway Management Planned: Oral ETT ? ?Additional Equipment: None ? ?Intra-op Plan:  ? ?Post-operative Plan: Extubation in OR ? ?Informed Consent: I have reviewed the patients History and Physical, chart, labs and discussed the procedure including the risks, benefits and alternatives for the proposed anesthesia with the patient or authorized representative who has indicated his/her understanding and acceptance.  ? ? ? ?Dental advisory given ? ?Plan Discussed with: CRNA ? ?Anesthesia Plan Comments:   ? ? ? ? ? ? ?Anesthesia Quick Evaluation ? ?

## 2021-09-21 NOTE — Op Note (Signed)
09/21/2021 ? ?9:37 PM ? ?PATIENT:  [name]  66 y.o. female presents with severe facet arthropathy, lateral recess stenosis at L3/4, and L4/5. Spondylolisthesis at L4/5. Admitted for decompression and arthrodesis. ? ?PRE-OPERATIVE DIAGNOSIS:  osteoarthritis L3/4 , L4/5 facets ?Lateral recess stenosis L3/4,4/5 ?Spondylolisthesis L4/5 ?POST-OPERATIVE DIAGNOSIS:  ? osteoarthritis L3/4 , L4/5 facets ?Lateral recess stenosis L3/4,4/5 ?Spondylolisthesis L4/5 ?PROCEDURE:  Procedure(s): ?LUMBAR THREE-FOUR, LUMBAR FOUR-FIVE POSTERIOR LUMBAR INTERBODY FUSION ?Expandable cages placed at L3/4. 4/5 packed with autograft morsels, disc space packed with autograft morsels [employer] ?Laminectomy L3, L4 beyond the needed exposure for a PLIF ?Segmental pedicle screw fixation L3-L5(Nuvasive Relign) ?SURGEON:  Surgeon(s): ?Ashok Pall, MD ? ?ASSISTANTS:Jones, Shanon Brow ? ?ANESTHESIA:   general ? ?EBL:  Total I/O ?In: 1000 [I.V.:1000] ?Out: 240 [Urine:140; Blood:100] ? ?BLOOD ADMINISTERED:none ? ?CELL SAVER GIVEN:none ? ?COUNT:per nursing ? ?DRAINS: none  ? ?SPECIMEN:  No Specimen ? ?DICTATION: [name] is a 66 y.o. female whom was taken to the operating room intubated, and placed under a general anesthetic without difficulty. A foley catheter was placed under sterile conditions. She was positioned prone on a Jackson table with all pressure points properly padded.  Her lumbar region was prepped and draped in a sterile manner. I opened the skin with a 10 blade and took the incision down to the thoracolumbar fascia. I exposed the lamina of L2,3,4,and 5 in a subperiosteal fashion bilaterally. I confirmed my location with an intraoperative xray.  I placed self retaining retractors and started the decompression.  ?I decompressed the spinal canal via hemilaminectomies of L3, and L4 along with superior facetectomies of L4, and L5, and inferior facetectomies of L3, and L4. This allowed for complete decompression of the L3, 4,  and L5 roots and the spinal canal. I removed the bone and ligamentum flavum with the drill and Kerrison punches. ?PLIF's were performed at L3/4, and 4/5 in the same fashion. I opened the disc space with a 15 blade then used a variety of instruments to remove the disc and prepare the space for the arthrodesis. We used curettes, rongeurs, punches, shavers for the disc space, and rasps in the discetomy. We measured the disc space and placed expandable cages  [employer]) into the disc space(s).  ?I placed pedicle screws at L3,4,and 5, using fluoroscopic guidance. I drilled a pilot hole, then cannulated the pedicle with a drill at each site. I then tapped each pedicle, assessing each site for pedicle violations. No cutouts were appreciated. Screws (Nuvasive relign) were then placed at each site without difficulty. I attached rods and locking caps with the appropriate tools. The locking caps were secured with torque limited screwdrivers. Final films were performed and the final construct appeared to be in good position.  ?I closed the wound in a layered fashion. I approximated the thoracolumbar fascia, subcutaneous, and subcuticular planes with vicryl sutures. I used dermabond for a sterile dressing.  ? ? ? ?PLAN OF CARE: Admit to inpatient  ? ?PATIENT DISPOSITION:  PACU - hemodynamically stable. ?  ?Delay start of Pharmacological VTE agent (>24hrs) due to surgical blood loss or risk of bleeding:  yes ? ?  ?

## 2021-09-21 NOTE — H&P (Signed)
?  There were no vitals taken for this visit. ?Mrs. Severns comes in today with a chief complaint of pain in her right lower extremity for approximately a month, which she describes as severe.  The pain is described as being constant.  She has had injections into the spine and a prednisone taper, both of which were ineffective. ? ?  ? ?VITAL SIGNS : ? ?She weighs 276 pounds.  She is 5 feet 7 inches.  Pain is 5/10.  Blood pressure 136/86, pulse is 96, temperature is 97.4. ? ?  ? ?SOCIAL HISTORY : ? ?She is left-handed.  She does not smoke.  She does not use illicit drugs.  She does not use alcohol.  She is 66 years of age. ? ?  ? ?PAST MEDICAL HISTORY : ? ?Significant for breast cancer, diabetes, and hypertension.  She has undergone a mastectomy and a knee replacement. ? ?  ? ?ALLERGIES : ? ?One drug allergy to Penicillin, which causes hives. ? ?  ? ?MEDICATIONS : ? ?She takes Lasix, Micardis, Metformin, and Telmisartan. ? ?  ? ?FAMILY HISTORY : ? ?Mother, 15, is in good health.  Father is deceased.  Hypertension present in the family history. ? ?  ? ?In her words, she has lower right back for a month.  The pain has gotten worse, she is not sure why. ? ?  ? ?REVIEW OF SYSTEMS : ? ?Positive for nausea, leg pain, back pain, and neck pain. ? ?  ? ?PHYSICAL EXAMINATION : ? ?Today, she is alert, oriented x4.  She answers all questions appropriately.  Memory, language, attention span, and fund of knowledge are normal.  Symmetric facial movements and sensation.  Hearing intact to voice.  Uvula elevates in the midline.  Shoulder shrug is normal.  Tongue protrudes in the midline.  Pupils are equal, round, reactive to light.  Full extraocular movements.  Full strength, 5/5, in the upper and lower extremities.  2+ reflexes biceps, triceps, brachioradialis, knees, and ankles.  Romberg is negative.  She is able to toe walk, heel walk, and do a squat. ? ?  ? ?IMAGING : ? ?A plain x-ray is reviewed.  What it shows is a significant  amount of facet arthropathy at 3-4, 4-5, 5-1.  She also had a CT of the abdomen and pelvis in 2022 and that brings out in even greater detail the facet arthropathy that is absolutely worse at L5-S1, the right side being worse than the left. ? ?  ? ?PLAN : ? ?I will need an MRI of the lumbar spine.  She has had doctors care, she has had injections, she has had time, I do believe she has had some therapy, none of which has helped.  I will therefore order an MRI and see what it is that we can do for her.she will undergo a Plif and decompression ? ?  ?

## 2021-09-22 NOTE — Evaluation (Signed)
Physical Therapy Evaluation ?Patient Details ?Name: [name] ?MRN: [id number] ?DOB: 1956-03-09 ?Today's Date: 09/22/2021 ? ?History of Present Illness ? Pt is a 66 yr old who complained about RLE pain for a month. Pt s/p 4/26 L 3-4, L4-5 posterior fusion. PMH but not limited to: breast ca s/p mastectomy , Dm, HTN, knee replacement  ?Clinical Impression ? Pt presents with functional mobility, strength, UE function, balance, and endurance secondary to diagnosis above. . These impairments are limiting her ability to safely and independently transfer, get into her home, perform all adls/iadls, and ambulate in the community. Pt to benefit from acute PT to address deficits. UE examination, STS tranfers, and gait performed and pt ambulated 205 feet with RW.  Pt shows equal strength and ROM in B UE for tested movements, full strength assessment was limited secondary to pain, but she reports decreased L UE coordination when performing functional tasks. Pt. Responded well but was limited secondary to by trunk pain, balance, and endurance. SPT recommends home health follow up for further functional strength, balance, and mobility training once medically stable for d/c. PT to progress mobility as tolerated, and will continue to follow acutely.  ?   ?   ? ?Recommendations for follow up therapy are one component of a multi-disciplinary discharge planning process, led by the attending physician.  Recommendations may be updated based on patient status, additional functional criteria and insurance authorization. ? ?Follow Up Recommendations Home health PT ? ?  ?Assistance Recommended at Discharge Set up Supervision/Assistance  ?Patient can return home with the following ? A little help with walking and/or transfers;Assistance with cooking/housework;A little help with bathing/dressing/bathroom;Assist for transportation ? ?  ?Equipment Recommendations Rolling walker (2 wheels)  ?   ?Functional Status Assessment Patient has  had a recent decline in their functional status and demonstrates the ability to make significant improvements in function in a reasonable and predictable amount of time.  ? ?  ?Precautions / Restrictions Precautions ?Precautions: Back ?Precaution Booklet Issued: No ?Required Braces or Orthoses: Spinal Brace ?Spinal Brace: Lumbar corset ?Restrictions ?Weight Bearing Restrictions: No ?Other Position/Activity Restrictions: Pt reported that they were unaware of precautions and log rolling  ? ?  ? ?Mobility ? Bed Mobility ?Overal bed mobility: Needs Assistance ?  ?  ?  ?  ?  ?  ?General bed mobility comments: Pt in chair upon ?  ? ?Transfers ?Overall transfer level: Needs assistance ?Equipment used: Rolling walker (2 wheels) ?Transfers: Sit to/from Stand ?Sit to Stand: Min assist ?  ?  ?  ?  ?  ?General transfer comment: Min A for power up, pt able to steady with RW. ?  ? ?Ambulation/Gait ?Ambulation/Gait assistance: Min guard ?Gait Distance (Feet): 205 Feet ?Assistive device: Rolling walker (2 wheels) ?Gait Pattern/deviations: Step-to pattern, Decreased stride length, Wide base of support, Trunk flexed ?Gait velocity: decreased ?  ?  ?General Gait Details: Pt with mildly flexed posture and short step length. Pt noted to stop multiple times during gait while talking, unsure if it was do to fatigue. ? ? ? ?  ? ?Balance Overall balance assessment: Needs assistance ?Sitting-balance support: Feet supported ?Sitting balance-Leahy Scale: Good ?Sitting balance - Comments: Able to reach moderately out of BOS within limits of back precautions. ?  ?Standing balance support: Bilateral upper extremity supported ?Standing balance-Leahy Scale: Poor ?Standing balance comment: steady with RW, requires support during standing and dynamic movement. ?  ?  ?  ?  ?  ?  ?  ?  ?  ?  ?  ?   ? ? ? ?  Pertinent Vitals/Pain Pain Assessment ?Pain Score: 7  ?Pain Descriptors / Indicators: Discomfort, Grimacing, Guarding, Numbness ?Pain  Intervention(s): Limited activity within patient's tolerance, Monitored during session, Premedicated before session  ? ? ?Home Living Family/patient expects to be discharged to:: Private residence ?Living Arrangements: Alone ?Available Help at Discharge: Family ?Type of Home: House ?Home Access: Level entry ?  ?  ?  ?Home Layout: One level ?Home Equipment: Shower seat - built in;Grab bars - tub/shower;Hand held shower head;Adaptive equipment ?   ?  ?Prior Function Prior Level of Function : Working/employed ?  ?  ?  ?  ?  ?  ?Mobility Comments: had some "miss steps but no falls" ?  ?  ? ? ?Hand Dominance  ? Dominant Hand: Left ? ?  ?Extremity/Trunk Assessment  ? Upper Extremity Assessment ?Upper Extremity Assessment: LUE deficits/detail ?LUE Deficits / Details: Overall left UE equal in strength and ROM to R UE, noted decreased coordination when moving to full horizontal abd.Edma noted, decrease in sensation from elbow to wrist on ulnar side. Full strength assessment limited secondary to pain. ?LUE Sensation: decreased light touch;decreased proprioception ?LUE Coordination: decreased fine motor;decreased gross motor ?  ? ?Lower Extremity Assessment ?Lower Extremity Assessment: Defer to PT evaluation ?  ? ?Cervical / Trunk Assessment ?Cervical / Trunk Assessment: Back Surgery  ?Communication  ? Communication: No difficulties  ?Cognition Arousal/Alertness: Awake/alert ?Behavior During Therapy: [facility] for tasks assessed/performed ?Overall Cognitive Status: Within Functional Limits for tasks assessed ?  ?  ?  ?  ?  ?  ?  ?  ?  ?  ?  ?  ?  ?  ?  ?  ?  ?  ?  ? ?  ?General Comments General comments (skin integrity, edema, etc.): VSS on RA ? ?  ?   ? ?Assessment/Plan  ?  ?PT Assessment Patient needs continued PT services  ?PT Problem List Decreased strength;Decreased mobility;Decreased range of motion;Decreased activity tolerance;Decreased balance;Decreased knowledge of use of DME;Pain ? ?   ?  ?PT Treatment Interventions DME  instruction;Therapeutic activities;Gait training;Therapeutic exercise;Patient/family education;Balance training;Functional mobility training;Neuromuscular re-education   ? ?PT Goals (Current goals can be found in the Care Plan section)  ?Acute Rehab PT Goals ?Patient Stated Goal: To return home. ?PT Goal Formulation: With patient ?Time For Goal Achievement: 10/06/21 ?Potential to Achieve Goals: Good ? ?  ?Frequency Min 5X/week ?  ? ? ?   ?AM-PAC PT "6 Clicks" Mobility  ?Outcome Measure Help needed turning from your back to your side while in a flat bed without using bedrails?: A Little ?Help needed moving from lying on your back to sitting on the side of a flat bed without using bedrails?: A Little ?Help needed moving to and from a bed to a chair (including a wheelchair)?: A Little ?Help needed standing up from a chair using your arms (e.g., wheelchair or bedside chair)?: A Little ?Help needed to walk in hospital room?: A Little ?Help needed climbing 3-5 steps with a railing? : A Little ?6 Click Score: 18 ? ?  ?End of Session Equipment Utilized During Treatment: Back brace ?Activity Tolerance: Patient tolerated treatment well ?Patient left: in chair;with call bell/phone within reach ?Nurse Communication: Mobility status ?PT Visit Diagnosis: Unsteadiness on feet (R26.81);Other abnormalities of gait and mobility (R26.89);Muscle weakness (generalized) (M62.81) ?  ? ?Time: 3818-2993 ?PT Time Calculation (min) (ACUTE ONLY): 33 min ? ? ?Charges:   PT Evaluation ?$PT Eval Low Complexity: 1 Low ?PT Treatments ?$Therapeutic Activity: 8-22 mins ?  ?   ? ? ?  Thermon Leyland, SPT ?Acute Rehab Services ? ? ?Thermon Leyland ?09/22/2021, 12:38 PM ? ?

## 2021-09-22 NOTE — Plan of Care (Signed)
  Problem: Pain Management: Goal: Pain level will decrease Outcome: Progressing   

## 2021-09-22 NOTE — Progress Notes (Signed)
Mobility Specialist: Progress Note ? ? 09/22/21 1549  ?Mobility  ?Activity Ambulated with assistance in hallway  ?Level of Assistance Minimal assist, patient does 75% or more  ?Assistive Device Front wheel walker  ?Distance Ambulated (ft) 130 ft  ?Activity Response Tolerated well  ?$Mobility charge 1 Mobility  ? ?Pt received in bed and agreeable to ambulation. C/o back pain when transferring from supine to sitting. MinA to sit EOB as well as to stand. C/o BUE fatigue during ambulation, otherwise no c/o. Pt back to bed after session with call bell and phone at her side.  ? ?[name] ?Mobility Specialist ?Mobility Specialist [place]: [phone] ?Mobility Specialist [place]: [phone] ? ?

## 2021-09-22 NOTE — TOC Initial Note (Signed)
Transition of Care (TOC) - Initial/Assessment Note  ? ? ?Patient Details  ?Name: [name] ?MRN: [id number] ?Date of Birth: 23-Sep-1955 ? ?Transition of Care (TOC) CM/SW Contact:    ?Verdell Carmine, RN ?Phone Number: ?09/22/2021, 8:46 AM ? ?Clinical Narrative:                 ? ?The Transition of Care Department [facility]) has reviewed patient and no TOC needs have been identified at this time. We will continue to monitor patient advancement through interdisciplinary progression rounds. If new patient transition needs arise, please place a TOC consult ? ? ?  ?  ? ? ?Patient Goals and CMS Choice ?  ?  ?  ? ?Expected Discharge Plan and Services ?  ?  ?  ?  ?  ?                ?  ?  ?  ?  ?  ?  ?  ?  ?  ?  ? ?Prior Living Arrangements/Services ?  ?  ?  ?       ?  ?  ?  ?  ? ?Activities of Daily Living ?Home Assistive Devices/Equipment: Gilford Rile (specify type) ?ADL Screening (condition at time of admission) ?Patient's cognitive ability adequate to safely complete daily activities?: Yes ?Is the patient deaf or have difficulty hearing?: No ?Does the patient have difficulty seeing, even when wearing glasses/contacts?: No ?Does the patient have difficulty concentrating, remembering, or making decisions?: No ?Patient able to express need for assistance with ADLs?: Yes ?Does the patient have difficulty dressing or bathing?: No ?Independently performs ADLs?: Yes (appropriate for developmental age) ?Does the patient have difficulty walking or climbing stairs?: Yes ?Weakness of Legs: None ?Weakness of Arms/Hands: None ? ?Permission Sought/Granted ?  ?  ?   ?   ?   ?   ? ?Emotional Assessment ?  ?  ?  ?  ?  ?  ? ?Admission diagnosis:  Spondylolisthesis of lumbar region [M43.16] ?Patient Active Problem List  ? Diagnosis Date Noted  ? Spondylolisthesis of lumbar region 09/21/2021  ? DDD (degenerative disc disease), cervical 05/04/2021  ? Neck pain 05/02/2021  ? Left arm pain 01/21/2021  ? Memory changes 04/19/2020  ? Educated  about COVID-19 virus infection 04/19/2020  ? Peripheral neuropathy 04/19/2020  ? TMJ arthralgia 04/19/2020  ? Urinary incontinence 10/27/2019  ? Other peripheral vertigo, unspecified ear 10/27/2019  ? Vertigo 03/14/2018  ? RLS (restless legs syndrome) 06/07/2017  ? Right knee pain 12/06/2016  ? Arthralgia 12/03/2016  ? Hyperlipidemia 08/21/2016  ? Anemia 07/17/2016  ? Genetic testing 05/15/2016  ? Allergic urticaria 03/27/2016  ? Dermographia 03/27/2016  ? History of breast cancer 03/27/2016  ? Rheumatoid arthritis ([place]) 06/24/2015  ? Diverticulitis of colon without hemorrhage 09/21/2014  ? Asthma with acute exacerbation 07/14/2014  ? Cough 07/06/2014  ? Gout of big toe 03/10/2013  ? Preventative health care 10/08/2012  ? OSA (obstructive sleep apnea) 04/23/2012  ? Diabetes mellitus, type 2 ([place]) 12/10/2011  ? Diarrhea 09/04/2011  ? Personal history of colonic polyps - adenoma 08/14/2011  ? Fatigue 08/03/2011  ? Ear canal dryness 08/03/2011  ? Palpitations 09/21/2010  ? Morbid obesity ([place]) 09/21/2010  ? PAC (premature atrial contraction) 09/21/2010  ? ATTENTION DEFICIT DISORDER, INATTENTIVE TYPE 09/22/2009  ? Acute sinusitis 09/22/2009  ? VIRAL MENINGITIS, HX OF 04/29/2009  ? Insomnia 02/15/2009  ? Vitamin D deficiency 03/11/2008  ? Fibromyalgia 03/05/2008  ? HYPERPARATHYROIDISM, HX  OF 08/06/2007  ? Allergic rhinitis 07/05/2007  ? Osteoarthritis 02/28/2007  ? Edema 11/01/2006  ? Depression with anxiety 06/25/2006  ? Essential hypertension 06/25/2006  ? Gastroesophageal reflux disease without esophagitis 06/25/2006  ? ?PCP:  Mosie Lukes, MD ?Pharmacy:   ?[place], [place] ?[address] [place] ?[place] [address] ?Phone: [phone] Fax: [phone] ? ?[facility] ([place], [address] ?[place] ?[address] ?[place] [address] ?Phone: [phone] Fax: [phone] ? ?[facility]  ([facility] ) - [place], [place] ?[address] ?[place] [address] ?Phone: [phone] Fax: [phone] ? ? ? ? ?Social Determinants of Health (SDOH) Interventions ?  ? ?Readmission Risk Interventions ?   ? View : No data to display.  ?  ?  ?  ? ? ? ?

## 2021-09-22 NOTE — Evaluation (Signed)
Occupational Therapy Evaluation ?Patient Details ?Name: [name] ?MRN: [id number] ?DOB: 21-May-1956 ?Today's Date: 09/22/2021 ? ? ?History of Present Illness Pt is a 66 yr old who complained about RLE pain for a month. Pt s/p 4/26 L 3-4, L4-5 posterior fusion. PMH but not limited to: breast ca s/p mastectomy , Dm, HTN, knee replacement  ? ?Clinical Impression ?  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 Pt in session required education of precautions, how to don brace and DME/AE. Pt self reported decrease in sensation in LUE and decrease in gross and FM coordination which was not present prior to sx. Pt currently with functional limitations due to the deficits listed below (see OT Problem List).  Pt will benefit from skilled OT to increase their safety and independence with ADL and functional mobility for ADL to facilitate discharge to venue listed below.  ?  ?   ? ?Recommendations for follow up therapy are one component of a multi-disciplinary discharge planning process, led by the attending physician.  Recommendations may be updated based on patient status, additional functional criteria and insurance authorization.  ? ?Follow Up Recommendations ? Home health OT  ?  ?Assistance Recommended at Discharge Frequent or constant Supervision/Assistance  ?Patient can return home with the following A little help with walking and/or transfers;A little help with bathing/dressing/bathroom;Assistance with cooking/housework;Assist for transportation ? ?  ?Functional Status Assessment ? Patient has had a recent decline in their functional status and demonstrates the ability to make significant improvements in function in a reasonable and predictable amount of time.  ?Equipment Recommendations ? BSC/3in1;Other (comment) (RW)  ?  ?Recommendations for Other Services   ? ? ?  ?Precautions / Restrictions Precautions ?Precautions: Back ?Precaution Booklet Issued: No ?Required Braces or Orthoses:  Spinal Brace ?Spinal Brace: Lumbar corset ?Restrictions ?Weight Bearing Restrictions: No ?Other Position/Activity Restrictions: Pt reported that they were unaware of precautions and log rolling  ? ?  ? ?Mobility Bed Mobility ?Overal bed mobility: Needs Assistance ?Bed Mobility: Rolling, Supine to Sit ?Rolling: Supervision ?  ?Supine to sit: Supervision ?  ?  ?General bed mobility comments: cued on log rolling ?  ? ?Transfers ?Overall transfer level: Needs assistance ?Equipment used: Rolling walker (2 wheels) ?Transfers: Sit to/from Stand ?Sit to Stand: Min guard ?  ?  ?  ?  ?  ?General transfer comment: cues on how to use RW ?  ? ?  ?Balance Overall balance assessment: Needs assistance ?Sitting-balance support: Feet supported ?Sitting balance-Leahy Scale: Good ?  ?  ?Standing balance support: Bilateral upper extremity supported ?Standing balance-Leahy Scale: Poor ?Standing balance comment: steady with RW, requires support during standing and dynamic movement. ?  ?  ?  ?  ?  ?  ?  ?  ?  ?  ?  ?   ? ?ADL either performed or assessed with clinical judgement  ? ?ADL Overall ADL's : Needs assistance/impaired ?Eating/Feeding: Independent;Sitting ?  ?Grooming: Wash/dry hands;Wash/dry face;Set up;Cueing for safety;Cueing for sequencing;Sitting;Standing ?  ?Upper Body Bathing: Set up;Cueing for safety;Cueing for sequencing;Sitting;Standing ?  ?Lower Body Bathing: Minimal assistance;Sit to/from stand ?  ?Upper Body Dressing : Set up;Sitting;Standing;Cueing for safety;Cueing for sequencing ?  ?Lower Body Dressing: Minimal assistance;Cueing for safety;Cueing for sequencing;Sit to/from stand ?  ?Toilet Transfer: Min guard;Cueing for safety;Cueing for sequencing;Rolling walker (2 wheels) ?  ?Toileting- Clothing Manipulation and Hygiene: Min guard;Cueing for safety;Cueing for sequencing;Sit to/from stand ?  ?Tub/ Shower Transfer: Min guard;Cueing for safety;Cueing for  sequencing;Rolling walker (2 wheels) ?  ?Functional mobility  during ADLs: Min guard;Cueing for safety;Cueing for sequencing;Rolling walker (2 wheels) ?   ? ? ? ?Vision Baseline Vision/History: 1 Wears glasses ?Ability to See in Adequate Light: 0 Adequate ?Patient Visual Report: No change from baseline ?   ?   ?Perception   ?  ?Praxis   ?  ? ?Pertinent Vitals/Pain Pain Assessment ?Pain Assessment: 0-10 ?Pain Score: 8  ?Pain Location: sx site ?Pain Descriptors / Indicators: Discomfort, Grimacing, Guarding, Numbness ?Pain Intervention(s): Limited activity within patient's tolerance, Monitored during session, Premedicated before session  ? ? ? ?Hand Dominance Left ?  ?Extremity/Trunk Assessment Upper Extremity Assessment ?Upper Extremity Assessment: LUE deficits/detail ?LUE Deficits / Details: Overall left UE equal in strength and ROM to R UE, noted decreased coordination when moving to full horizontal abd.Edma noted, decrease in sensation from elbow to wrist on ulnar side. Full strength assessment limited secondary to pain. ?LUE Sensation: decreased light touch;decreased proprioception ?LUE Coordination: decreased fine motor;decreased gross motor ?  ?Lower Extremity Assessment ?Lower Extremity Assessment: Defer to PT evaluation ?  ?Cervical / Trunk Assessment ?Cervical / Trunk Assessment: Back Surgery ?  ?Communication Communication ?Communication: No difficulties ?  ?Cognition Arousal/Alertness: Awake/alert ?Behavior During Therapy: [facility] for tasks assessed/performed ?Overall Cognitive Status: Within Functional Limits for tasks assessed ?  ?  ?  ?  ?  ?  ?  ?  ?  ?  ?  ?  ?  ?  ?  ?  ?  ?  ?  ?General Comments  VSS on RA ? ?  ?Exercises   ?  ?Shoulder Instructions    ? ? ?Home Living Family/patient expects to be discharged to:: Private residence ?Living Arrangements: Alone ?Available Help at Discharge: Family ?Type of Home: House ?Home Access: Level entry ?  ?  ?Home Layout: One level ?  ?  ?Bathroom Shower/Tub: Walk-in shower ?  ?Bathroom Toilet: Standard ?Bathroom  Accessibility: Yes ?How Accessible: Accessible via walker ?Home Equipment: Shower seat - built in;Grab bars - tub/shower;Hand held shower head;Adaptive equipment ?Adaptive Equipment: Reacher ?  ?  ? ?  ?Prior Functioning/Environment Prior Level of Function : Working/employed ?  ?  ?  ?  ?  ?  ?Mobility Comments: had some "miss steps but no falls" ?  ?  ? ?  ?  ?OT Problem List: Decreased strength;Decreased range of motion;Decreased activity tolerance;Impaired balance (sitting and/or standing);Decreased safety awareness;Decreased knowledge of use of DME or AE;Pain ?  ?   ?OT Treatment/Interventions: Self-care/ADL training;Therapeutic exercise;DME and/or AE instruction;Therapeutic activities;Patient/family education;Balance training  ?  ?OT Goals(Current goals can be found in the care plan section) Acute Rehab OT Goals ?Patient Stated Goal: to be able to go home ?OT Goal Formulation: With patient ?Time For Goal Achievement: 10/06/21 ?Potential to Achieve Goals: Good ?ADL Goals ?Pt Will Perform Upper Body Bathing: with modified independence;sitting ?Pt Will Perform Lower Body Bathing: with modified independence;with adaptive equipment;sit to/from stand ?Pt Will Transfer to Toilet: with modified independence;ambulating;bedside commode ?Pt Will Perform Tub/Shower Transfer: Shower transfer;with supervision;ambulating;shower seat;grab bars  ?OT Frequency: Min 2X/week ?  ? ?Co-evaluation   ?  ?  ?  ?  ? ?  ?AM-PAC OT "6 Clicks" Daily Activity     ?Outcome Measure Help from another person eating meals?: None ?Help from another person taking care of personal grooming?: A Little ?Help from another person toileting, which includes using toliet, bedpan, or urinal?: A Little ?Help from another person bathing (including washing,  rinsing, drying)?: A Lot ?Help from another person to put on and taking off regular upper body clothing?: A Little ?Help from another person to put on and taking off regular lower body clothing?: A Lot ?6  Click Score: 17 ?  ?End of Session Equipment Utilized During Treatment: Rolling walker (2 wheels);Gait belt ?Nurse Communication: Mobility status ? ?Activity Tolerance: Patient limited by fatigue;Patient limited by l

## 2021-09-22 NOTE — Progress Notes (Signed)
Patient ID: [name], female   DOB: 1956/04/19, 66 y.o.   MRN: [id number] ?BP (!) 96/56 (BP Location: Left Arm)   Pulse (!) 110   Temp 99.7 ?F (37.6 ?C)   Resp 18   Ht '5\' 6"'$  (1.676 m)   Wt 117.9 kg   SpO2 94%   BMI 41.97 kg/m?  ?Alert and oriented, speech is clear and lfuent ?Moving all extremities, apraxic left forearm ?Wound is clean, dry ?Ambulating well ?progressing ?

## 2021-09-22 NOTE — Anesthesia Postprocedure Evaluation (Signed)
Anesthesia Post Note ? ?Patient: [name] ? ?Procedure(s) Performed: LUMBAR THREE-FOUR, LUMBAR FOUR-FIVE POSTERIOR LUMBAR INTERBODY FUSION (Spine Lumbar) ? ?  ? ?Patient location during evaluation: PACU ?Anesthesia Type: General ?Level of consciousness: sedated and patient cooperative ?Pain management: pain level controlled ?Vital Signs Assessment: post-procedure vital signs reviewed and stable ?Respiratory status: spontaneous breathing ?Cardiovascular status: stable ?Anesthetic complications: no ? ? ?No notable events documented. ? ?Last Vitals:  ?Vitals:  ? 09/22/21 1607 09/22/21 2000  ?BP: (!) 96/56 (!) 91/56  ?Pulse: (!) 102 98  ?Resp: 18 18  ?Temp: 37.6 ?C 37.6 ?C  ?SpO2: 94% 94%  ?  ?Last Pain:  ?Vitals:  ? 09/22/21 2000  ?TempSrc: Oral  ?PainSc:   ? ? ?  ?  ?  ?  ?  ?  ? ?Nolon Nations ? ? ? ? ?

## 2021-09-23 LAB — GLUCOSE, CAPILLARY: Glucose-Capillary: 119 mg/dL — ABNORMAL HIGH (ref 70–99)

## 2021-09-23 MED ORDER — TIZANIDINE HCL 4 MG PO TABS: 4.0000 mg | ORAL_TABLET | Freq: Four times a day (QID) | ORAL | 0 refills | Status: DC | PRN

## 2021-09-23 MED ORDER — OXYCODONE HCL 10 MG PO TABS: 10.0000 mg | ORAL_TABLET | ORAL | 0 refills | Status: DC | PRN

## 2021-09-23 NOTE — Discharge Summary (Signed)
Physician Discharge Summary  ?Patient ID: ?[name] ?MRN: [id number] ?DOB/AGE: 02-May-1956 66 y.o. ? ?Admit date: 09/21/2021 ?Discharge date: 09/24/2021 ? ?Admission Diagnoses:spondylolisthesis lumbar spine, lumbar stenosis ? ?Discharge Diagnoses:  ?Principal Problem: ?  Spondylolisthesis of lumbar region ? ? ?Discharged Condition: good ? ?Hospital Course: [name] is a 66 y.o. female ?Admitted for a lumbar arthrodesis and decompression L3-5. Post op she is ambulating, voiding, and tolerating a regular diet. Wound is clean, dry and intact.  ? ?Treatments: surgery: LUMBAR THREE-FOUR, LUMBAR FOUR-FIVE POSTERIOR LUMBAR INTERBODY FUSION ?Expandable cages placed at L3/4. 4/5 packed with autograft morsels, disc space packed with autograft morsels [employer] ?Laminectomy L3, L4 beyond the needed exposure for a PLIF ?Segmental pedicle screw fixation L3-L5(Nuvasive Relign) ? ? ?Discharge Exam: ?Blood pressure 118/60, pulse (!) 109, temperature 99.5 ?F (37.5 ?C), temperature source Oral, resp. rate 18, height '5\' 6"'$  (1.676 m), weight 117.9 kg, SpO2 96 %. ?General appearance: alert, cooperative, appears stated age, and mild distress ? ?Disposition: Discharge disposition: 01-Home or Self Care ? ? ? ? ? ?Lumbar Stenosis Spondylodesis ? ?Allergies as of 09/23/2021   ? ?   Reactions  ? Allopurinol Hives  ? Mucinex [guaifenesin Er] Shortness Of Breath  ? Penicillins Hives, Other (See Comments)  ? Has patient had a PCN reaction causing immediate rash, facial/tongue/throat swelling, SOB or lightheadedness with hypotension: no ?Has patient had a PCN reaction causing severe rash involving mucus membranes or skin necrosis: no ?Has patient had a PCN reaction that required hospitalization no ?Has patient had a PCN reaction occurring within the last 10 years: no ?If all of the above answers are "NO", then may proceed with Cephalosporin use.  ? Prozac [fluoxetine Hcl] Hives  ? Amitriptyline Other (See Comments)  ? "felt weird,  fatigue, dizziness"  ? Lexapro [escitalopram Oxalate]   ? Nausea and hypersalivation.   ? ?  ? ?  ?Medication List  ?  ? ?STOP taking these medications   ? ?meloxicam 15 MG tablet ?Commonly known as: MOBIC ?  ? ?  ? ?TAKE these medications   ? ?acetaminophen 650 MG CR tablet ?Commonly known as: TYLENOL ?Take 1,300 mg by mouth every 8 (eight) hours as needed for pain. ?  ?albuterol 108 (90 Base) MCG/ACT inhaler ?Commonly known as: VENTOLIN HFA ?Inhale 2 puffs into the lungs every 6 (six) hours as needed. ?What changed: reasons to take this ?  ?buPROPion 150 MG 12 hr tablet ?Commonly known as: WELLBUTRIN SR ?TAKE 1 TABLET BY MOUTH DAILY ?  ?calcium carbonate 1500 (600 Ca) MG Tabs tablet ?Commonly known as: OSCAL ?Take 1,500 mg by mouth 2 (two) times daily with a meal. ?  ?cholecalciferol 25 MCG (1000 UNIT) tablet ?Commonly known as: VITAMIN D3 ?Take 1,000 Units by mouth daily. ?  ?diclofenac Sodium 1 % Gel ?Commonly known as: VOLTAREN ?Apply 2 g topically 4 (four) times daily as needed (pain). ?  ?famotidine 40 MG tablet ?Commonly known as: PEPCID ?Take 1 tablet (40 mg total) by mouth daily. ?  ?fluticasone 50 MCG/ACT nasal spray ?Commonly known as: FLONASE ?Place 2 sprays into both nostrils daily. ?What changed:  ?when to take this ?reasons to take this ?  ?furosemide 40 MG tablet ?Commonly known as: LASIX ?TAKE 1 TABLET BY MOUTH  TWICE DAILY ?What changed: when to take this ?  ?hydrochlorothiazide 12.5 MG capsule ?Commonly known as: MICROZIDE ?TAKE 1 CAPSULE BY MOUTH  DAILY ?  ?Insulin Pen Needle 32G X 6 MM Misc ?Use with Victoza  daily ?  ?metFORMIN 500 MG tablet ?Commonly known as: GLUCOPHAGE ?TAKE 1 TABLET BY MOUTH  TWICE DAILY WITH A MEAL ?  ?MULTIVITAMIN & MINERAL PO ?Take 1 tablet by mouth daily. ?  ?Oxycodone HCl 10 MG Tabs ?Take 1 tablet (10 mg total) by mouth every 3 (three) hours as needed for severe pain ((score 7 to 10)). ?  ?sertraline 50 MG tablet ?Commonly known as: ZOLOFT ?TAKE ONE-HALF TABLET BY   MOUTH TWICE DAILY ?What changed: when to take this ?  ?telmisartan 20 MG tablet ?Commonly known as: MICARDIS ?TAKE 1 TABLET BY MOUTH  DAILY ?  ?tiZANidine 4 MG tablet ?Commonly known as: ZANAFLEX ?Take 1 tablet (4 mg total) by mouth every 6 (six) hours as needed for muscle spasms. ?  ?traMADol 50 MG tablet ?Commonly known as: ULTRAM ?Take 50-100 mg by mouth 3 (three) times daily as needed for pain. ?  ?Zinc 30 MG Caps ?Take 30 mg by mouth daily. ?  ? ?  ? ? Follow-up Information   ? ? Care, [facility] Follow up.   ?Specialty: Home Health Services ?Why: Your home health has been set up with [facility]. The office will call you with start of service dates. If you have any questions please call number listed above. ?Contact information: ?[address] ?[address] ?[place] [address] ?[phone] ? ? ?  ?  ? ? Ashok Pall, MD Follow up.   ?Specialty: Neurosurgery ?Why: keep scheduled appointment ?Contact information: ?[address] ?[address] ?[place] [address] ?[phone] ? ? ?  ?  ? ?  ?  ? ?  ? ? ?Signed: ?Ashok Pall ?09/23/2021, 7:16 PM ?  ? ?

## 2021-09-23 NOTE — Discharge Instructions (Signed)

## 2021-09-23 NOTE — TOC Progression Note (Signed)
Transition of Care (TOC) - Progression Note  ? ? ?Patient Details  ?Name: [name] ?MRN: [id number] ?Date of Birth: 01/04/1956 ? ?Transition of Care (TOC) CM/SW Contact  ?Angelita Ingles, RN ?Phone Number:[phone] ? ?09/23/2021, 1:10 PM ? ?Clinical Narrative:    ?Patient has recommendations for Home health PT/OT [name] CM at bedside to offer choice. HH referral accepted by [facility] with Alvis Lemmings. MD will need to enter [facility] orders ? ? ?  ?  ? ?Expected Discharge Plan and Services ?  ?  ?  ?  ?  ?                ?  ?  ?  ?  ?  ?  ?  ?  ?  ?  ? ? ?Social Determinants of Health (SDOH) Interventions ?  ? ?Readmission Risk Interventions ?   ? View : No data to display.  ?  ?  ?  ? ? ?

## 2021-09-23 NOTE — Progress Notes (Signed)
Mobility Specialist: Progress Note ? ? 09/23/21 1435  ?Mobility  ?Activity Ambulated with assistance in hallway  ?Level of Assistance Modified independent, requires aide device or extra time  ?Assistive Device Front wheel walker  ?Distance Ambulated (ft) 200 ft  ?Activity Response Tolerated well  ?$Mobility charge 1 Mobility  ? ?Pt received in bed and agreeable to ambulation. Pt mod I with bed mobility as well as ambulation. To BR and then hallway ambulation. C/o LUE soreness during session, otherwise no other c/o. Pt back to bed after session with call bell and phone at her side.  ? ?[name] ?Mobility Specialist ?Mobility Specialist [place]: [phone] ?Mobility Specialist [place]: [phone] ? ?

## 2021-09-23 NOTE — Progress Notes (Signed)
Patient ID: [name], female   DOB: Jul 05, 1955, 66 y.o.   MRN: [id number] ?BP 118/60 (BP Location: Left Arm)   Pulse (!) 109   Temp 99.5 ?F (37.5 ?C) (Oral)   Resp 18   Ht '5\' 6"'$  (1.676 m)   Wt 117.9 kg   SpO2 96%   BMI 41.97 kg/m?  ?Alert and oriented x 4 speech is clear and fluent ?Moving all extremities well ?Wound dressing is dry ?Discharge tomorrow ?

## 2021-09-23 NOTE — Progress Notes (Signed)
Physical Therapy Treatment ?Patient Details ?Name: [name] ?MRN: [id number] ?DOB: 1956/01/10 ?Today's Date: 09/23/2021 ? ? ?History of Present Illness Pt is a 66 yr old who complained about RLE pain for a month. Pt s/p 4/26 L 3-4, L4-5 posterior fusion. PMH but not limited to: breast ca s/p mastectomy , Dm, HTN, knee replacement ? ?  ?PT Comments  ? ? Pt tolerates treatment well, demonstrating improved bed mobility technique and increased ambulation tolerance. Pt continues to report a need for UE support with standing and ambulation. PT encourages frequent mobility in an effort to improve activity tolerance and restore independence.   ?Recommendations for follow up therapy are one component of a multi-disciplinary discharge planning process, led by the attending physician.  Recommendations may be updated based on patient status, additional functional criteria and insurance authorization. ? ?Follow Up Recommendations ? Home health PT ?  ?  ?Assistance Recommended at Discharge Set up Supervision/Assistance  ?Patient can return home with the following A little help with walking and/or transfers;Assistance with cooking/housework;A little help with bathing/dressing/bathroom;Assist for transportation ?  ?Equipment Recommendations ? Rolling walker (2 wheels)  ?  ?Recommendations for Other Services   ? ? ?  ?Precautions / Restrictions Precautions ?Precautions: Back ?Precaution Booklet Issued: No ?Precaution Comments: pt is able to recall BLT acronym ?Required Braces or Orthoses: Spinal Brace ?Spinal Brace: Lumbar corset ?Restrictions ?Weight Bearing Restrictions: No  ?  ? ?Mobility ? Bed Mobility ?Overal bed mobility: Needs Assistance ?Bed Mobility: Sidelying to Sit, Rolling, Sit to Sidelying ?Rolling: Supervision ?Sidelying to sit: Supervision ?  ?  ?Sit to sidelying: Supervision ?  ?  ? ?Transfers ?Overall transfer level: Needs assistance ?Equipment used: Rolling walker (2 wheels) ?Transfers: Sit to/from  Stand ?Sit to Stand: Min guard ?  ?  ?  ?  ?  ?  ?  ? ?Ambulation/Gait ?Ambulation/Gait assistance: Supervision ?Gait Distance (Feet): 250 Feet ?Assistive device: Rolling walker (2 wheels) ?Gait Pattern/deviations: Step-through pattern ?Gait velocity: reduced ?Gait velocity interpretation: 1.31 - 2.62 ft/sec, indicative of limited community ambulator ?  ?General Gait Details: pt with steady step-through gait, mild increase in trunk flexion ? ? ?Stairs ?  ?  ?  ?  ?  ? ? ?Wheelchair Mobility ?  ? ?Modified Rankin (Stroke Patients Only) ?  ? ? ?  ?Balance Overall balance assessment: Needs assistance ?Sitting-balance support: No upper extremity supported, Feet supported ?Sitting balance-Leahy Scale: Good ?  ?  ?Standing balance support: Single extremity supported, Reliant on assistive device for balance ?Standing balance-Leahy Scale: Poor ?  ?  ?  ?  ?  ?  ?  ?  ?  ?  ?  ?  ?  ? ?  ?Cognition Arousal/Alertness: Awake/alert ?Behavior During Therapy: [facility] for tasks assessed/performed ?Overall Cognitive Status: Within Functional Limits for tasks assessed ?  ?  ?  ?  ?  ?  ?  ?  ?  ?  ?  ?  ?  ?  ?  ?  ?  ?  ?  ? ?  ?Exercises   ? ?  ?General Comments General comments (skin integrity, edema, etc.): VSS on RA ?  ?  ? ?Pertinent Vitals/Pain Pain Assessment ?Pain Assessment: 0-10 ?Pain Score: 5  ?Pain Location: back ?Pain Descriptors / Indicators: Sore ?Pain Intervention(s): Monitored during session  ? ? ?Home Living   ?  ?  ?  ?  ?  ?  ?  ?  ?  ?   ?  ?  Prior Function    ?  ?  ?   ? ?PT Goals (current goals can now be found in the care plan section) Acute Rehab PT Goals ?Patient Stated Goal: To return home. ?Progress towards PT goals: Progressing toward goals ? ?  ?Frequency ? ? ? Min 5X/week ? ? ? ?  ?PT Plan Current plan remains appropriate  ? ? ?Co-evaluation   ?  ?  ?  ?  ? ?  ?AM-PAC PT "6 Clicks" Mobility   ?Outcome Measure ? Help needed turning from your back to your side while in a flat bed without using bedrails?: A  Little ?Help needed moving from lying on your back to sitting on the side of a flat bed without using bedrails?: A Little ?Help needed moving to and from a bed to a chair (including a wheelchair)?: A Little ?Help needed standing up from a chair using your arms (e.g., wheelchair or bedside chair)?: A Little ?Help needed to walk in hospital room?: A Little ?Help needed climbing 3-5 steps with a railing? : A Little ?6 Click Score: 18 ? ?  ?End of Session Equipment Utilized During Treatment: Back brace ?Activity Tolerance: Patient tolerated treatment well ?Patient left: in bed;with call bell/phone within reach ?Nurse Communication: Mobility status ?PT Visit Diagnosis: Unsteadiness on feet (R26.81);Other abnormalities of gait and mobility (R26.89);Muscle weakness (generalized) (M62.81) ?  ? ? ?Time: [phone] ?PT Time Calculation (min) (ACUTE ONLY): 13 min ? ?Charges:  $Gait Training: 8-22 mins          ?          ? ?Zenaida Niece, PT, DPT ?Acute Rehabilitation ?Pager: [phone] ?Office [phone] ? ? ? ?Zenaida Niece ?09/23/2021, 9:08 AM ? ?

## 2021-09-24 NOTE — Progress Notes (Signed)
Pt discharge education and instructions completed with pt and daughter at bedside. Both voices understanding and all questions addressed. Pt back incision with minimal serosanguinous oozing from incision. New dsg applied and NP call [place] notified of pt's concern. NP also notified of [facility] pharmacist calling RN about concern for pt's discharge oxycodone prescription order. NP said no changes to pt's order as that is what Dr. Christella Noa ordered. [facility] pharmacist notified. Pt discharged home with daughter to transport her home. Pt handed her zanaflex prescription by MD. Pt transported off unit via wheelchair with belongings and daughter to the side. P.Amo Mayling Aber RN ?

## 2021-09-29 MED FILL — Heparin Sodium (Porcine) Inj 1000 Unit/ML: INTRAMUSCULAR | Qty: 30 | Status: AC

## 2021-09-29 MED FILL — Sodium Chloride Irrigation Soln 0.9%: Qty: 3000 | Status: AC

## 2021-09-29 MED FILL — Sodium Chloride IV Soln 0.9%: INTRAVENOUS | Qty: 1000 | Status: AC

## 2021-10-05 ENCOUNTER — Other Ambulatory Visit: Payer: Self-pay | Admitting: Neurosurgery

## 2021-10-06 ENCOUNTER — Encounter (HOSPITAL_COMMUNITY): Payer: Self-pay | Admitting: Neurosurgery

## 2021-10-06 ENCOUNTER — Other Ambulatory Visit: Payer: Self-pay

## 2021-10-06 NOTE — Pre-Procedure Instructions (Addendum)
SDW CALL ? ?Patient was given pre-op instructions over the phone. The opportunity was given for the patient to ask questions. No further questions asked. Patient verbalized understanding of instructions given. ? ? ?PCP -  ? Mosie Lukes, MD  ? ?Cardiologist - denies ? ?PPM/ICD - denies ?Chest x-ray - N/A ?EKG - 01/21/21 ?Stress Test - denies ?ECHO - 12/11/11 ?Cardiac Cath - denies ? ?Sleep Study - 01/06/20 ?CPAP - pt to bring CPAP mask- does not know settings.  ? ?Fasting Blood Sugar - pt Diabetic, does not check blood sugar at home, but has meter.  ?Last Hgb A1c 6.6 04/08/21 ? ? ?Blood Thinner Instructions: N/A ?ERAS Protcol -no orders- eras per protocol ? ? ?COVID TEST- N/A ? ? ?Anesthesia review: no ? ?Patient denies shortness of breath, fever, cough and chest pain over the phone call ? ? ? ?Surgical Instructions ? ? ? Your procedure is scheduled on Friday, May 12 ? Report to [facility] Main Entrance "A" at 12:15 P.M., then check in with the Admitting office. ? Call this number if you have problems the morning of surgery: ? [phone] ? ? ? Remember: ? Do not eat after midnight the night before your surgery ? ?You may drink clear liquids until 11:45 AM the morning of your surgery.   ?Clear liquids allowed are: Water, Non-Citrus Juices (without pulp), Carbonated Beverages, Clear Tea, Black Coffee ONLY (NO MILK, CREAM OR POWDERED CREAMER of any kind), and Gatorade ? ? Take these medicines the morning of surgery with A SIP OF WATER:  ? ?buPROPion [facility] SR)  ?sertraline (ZOLOFT)  ?famotidine (PEPCID) ?acetaminophen (TYLENOL)  if needed ?oxyCODONE  if needed ?tiZANidine (ZANAFLEX) if needed ?albuterol (VENTOLIN HFA) 108 (90 Base)  if needed- please bring inhaler to hospital ? ? ?Do not take oral diabetes medicines (pills) the morning of surgery. DO not take Metformin (Glucophage) the morning of surgery ? ? ?Check your blood sugar the morning of your surgery when you wake up and every 2 hours until you get to  the Short Stay unit. ? ?If your blood sugar is less than 70 mg/dL, you will need to treat for low blood sugar: ?Do not take insulin. ?Treat a low blood sugar (less than 70 mg/dL) with ? cup of clear juice (cranberry or apple), 4 glucose tablets, OR glucose gel. ?Recheck blood sugar in 15 minutes after treatment (to make sure it is greater than 70 mg/dL). If your blood sugar is not greater than 70 mg/dL on recheck, call [phone] for further instructions. ? ?As of today, STOP taking any Aspirin (unless otherwise instructed by your surgeon) Aleve, Naproxen, Ibuprofen, Motrin, Advil, Goody's, BC's, all herbal medications, fish oil, and all vitamins. ? ?[facility] is not responsible for any belongings or valuables.  ? ?If you use a CPAP at night, you may bring your mask for your overnight stay. ?  ?Contacts, glasses, hearing aids, dentures or partials may not be worn into surgery, please bring cases for these belongings ?  ?Patients discharged the day of surgery will not be allowed to drive home, and someone needs to stay with them for 24 hours. ? ? ?SURGICAL WAITING ROOM VISITATION ?No visitors are allowed in pre-op area with patient.  ?Patients having surgery or a procedure in a hospital may have two support people in the waiting room. ?Children under the age of 36 must have an adult with them who is not the patient. ?They may stay in the waiting area during the procedure  and may switch out with other visitors. If the patient needs to stay at the hospital during part of their recovery, the visitor guidelines for inpatient rooms apply. ? ?Please refer to the [facility] website for the visitor guidelines for Inpatients (after your surgery is over and you are in a regular room).  ? ? ? ?Special instructions:   ? ?Oral Hygiene is also important to reduce your risk of infection.  Remember - BRUSH YOUR TEETH THE MORNING OF SURGERY WITH YOUR REGULAR TOOTHPASTE ? ? ?Day of Surgery: ? ?Take a shower the day of or night  before with antibacterial soap. ?Wear Clean/Comfortable clothing the morning of surgery ?Do not apply any deodorants/lotions.   ?Do not wear jewelry or makeup ?Do not wear lotions, powders, perfumes/colognes, or deodorant. ?Do not shave 48 hours prior to surgery.  Men may shave face and neck. ?Do not bring valuables to the hospital. ?Do not wear nail polish, gel polish, artificial nails, or any other type of covering on natural nails (fingers and toes) ?If you have artificial nails or gel coating that need to be removed by a nail salon, please have this removed prior to surgery. Artificial nails or gel coating may interfere with anesthesia's ability to adequately monitor your vital signs. ?Remember to brush your teeth WITH YOUR REGULAR TOOTHPASTE. ? ? ? ? ? ?

## 2021-10-06 NOTE — Anesthesia Preprocedure Evaluation (Addendum)
Anesthesia Evaluation  ?Patient identified by MRN, date of birth, ID band ?Patient awake ? ? ? ?Reviewed: ?Allergy & Precautions, H&P , NPO status , Patient's Chart, lab work & pertinent test results ? ?History of Anesthesia Complications ?(+) PONV and history of anesthetic complications ? ?Airway ?Mallampati: III ? ?TM Distance: >3 FB ?Neck ROM: Full ? ? ? Dental ?no notable dental hx. ?(+) Teeth Intact, Dental Advisory Given ?  ?Pulmonary ?asthma , sleep apnea and Continuous Positive Airway Pressure Ventilation ,  ?  ?Pulmonary exam normal ?breath sounds clear to auscultation ? ? ? ? ? ? Cardiovascular ?hypertension, Pt. on medications ? ?Rhythm:Regular Rate:Normal ? ? ?  ?Neuro/Psych ?Anxiety Depression negative neurological ROS ?   ? GI/Hepatic ?Neg liver ROS, GERD  Medicated,  ?Endo/Other  ?diabetesMorbid obesity ? Renal/GU ?negative Renal ROSLab Results ?     Component                Value               Date                 ?     CREATININE               1.03 (H)            09/20/2021           ?      ?negative genitourinary ?  ?Musculoskeletal ? ?(+) Arthritis , Osteoarthritis,  Fibromyalgia - ? Abdominal ?  ?Peds ? Hematology ?negative hematology ROS ?(+) Lab Results ?     Component                Value               Date                 ?          HGB                      12.9                09/20/2021          ?     PLT                      252                 09/20/2021           ?   ?Anesthesia Other Findings ? ? Reproductive/Obstetrics ?negative OB ROS ? ?  ? ? ? ? ? ? ? ? ? ? ? ? ? ?  ?  ? ? ? ? ? ? ?Anesthesia Physical ?Anesthesia Plan ? ?ASA: 3 ? ?Anesthesia Plan: General  ? ?Post-op Pain Management: Tylenol PO (pre-op)*  ? ?Induction: Intravenous ? ?PONV Risk Score and Plan: 4 or greater and Treatment may vary due to age or medical condition, Midazolam, Scopolamine patch - Pre-op, Ondansetron and Dexamethasone ? ?Airway Management Planned: Oral ETT ? ?Additional  Equipment: None ? ?Intra-op Plan:  ? ?Post-operative Plan: Extubation in OR ? ?Informed Consent: I have reviewed the patients History and Physical, chart, labs and discussed the procedure including the risks, benefits and alternatives for the proposed anesthesia with the patient or authorized representative who has indicated his/her understanding and acceptance.  ? ? ? ?Dental advisory given ? ?Plan Discussed with: CRNA ? ?  Anesthesia Plan Comments:   ? ? ? ? ?Anesthesia Quick Evaluation ? ?

## 2021-10-07 ENCOUNTER — Ambulatory Visit (HOSPITAL_BASED_OUTPATIENT_CLINIC_OR_DEPARTMENT_OTHER): Payer: Medicare Other | Admitting: Anesthesiology

## 2021-10-07 ENCOUNTER — Encounter (HOSPITAL_COMMUNITY)
Admission: AD | Disposition: A | Discharge status: Home / Self Care | Payer: Self-pay | Source: Home / Self Care | Attending: Neurosurgery

## 2021-10-07 ENCOUNTER — Encounter (HOSPITAL_COMMUNITY): Payer: Self-pay | Admitting: Neurosurgery

## 2021-10-07 ENCOUNTER — Inpatient Hospital Stay (HOSPITAL_COMMUNITY)
Admission: AD | Admit: 2021-10-07 | Discharge: 2021-10-08 | DRG: 909 | Disposition: A | Discharge status: Home / Self Care | Payer: Medicare Other | Attending: Neurological Surgery | Admitting: Neurological Surgery

## 2021-10-07 ENCOUNTER — Ambulatory Visit (HOSPITAL_COMMUNITY): Payer: Medicare Other | Admitting: Anesthesiology

## 2021-10-07 DIAGNOSIS — F419 Anxiety disorder, unspecified: Secondary | ICD-10-CM | POA: Diagnosis present

## 2021-10-07 DIAGNOSIS — Z8249 Family history of ischemic heart disease and other diseases of the circulatory system: Secondary | ICD-10-CM

## 2021-10-07 DIAGNOSIS — R7303 Prediabetes: Secondary | ICD-10-CM | POA: Diagnosis not present

## 2021-10-07 DIAGNOSIS — L24A9 Irritant contact dermatitis due friction or contact with other specified body fluids: Principal | ICD-10-CM | POA: Diagnosis present

## 2021-10-07 DIAGNOSIS — Y838 Other surgical procedures as the cause of abnormal reaction of the patient, or of later complication, without mention of misadventure at the time of the procedure: Secondary | ICD-10-CM | POA: Diagnosis present

## 2021-10-07 DIAGNOSIS — T8189XA Other complications of procedures, not elsewhere classified, initial encounter: Principal | ICD-10-CM | POA: Diagnosis present

## 2021-10-07 DIAGNOSIS — Z853 Personal history of malignant neoplasm of breast: Secondary | ICD-10-CM

## 2021-10-07 DIAGNOSIS — E559 Vitamin D deficiency, unspecified: Secondary | ICD-10-CM | POA: Diagnosis present

## 2021-10-07 DIAGNOSIS — F32A Depression, unspecified: Secondary | ICD-10-CM | POA: Diagnosis not present

## 2021-10-07 DIAGNOSIS — Z803 Family history of malignant neoplasm of breast: Secondary | ICD-10-CM

## 2021-10-07 DIAGNOSIS — Z96652 Presence of left artificial knee joint: Secondary | ICD-10-CM | POA: Diagnosis present

## 2021-10-07 DIAGNOSIS — Z981 Arthrodesis status: Secondary | ICD-10-CM | POA: Diagnosis not present

## 2021-10-07 DIAGNOSIS — Z833 Family history of diabetes mellitus: Secondary | ICD-10-CM

## 2021-10-07 DIAGNOSIS — Z8371 Family history of colonic polyps: Secondary | ICD-10-CM | POA: Diagnosis not present

## 2021-10-07 DIAGNOSIS — Z8261 Family history of arthritis: Secondary | ICD-10-CM | POA: Diagnosis not present

## 2021-10-07 DIAGNOSIS — Z880 Allergy status to penicillin: Secondary | ICD-10-CM

## 2021-10-07 DIAGNOSIS — G4730 Sleep apnea, unspecified: Secondary | ICD-10-CM | POA: Diagnosis present

## 2021-10-07 DIAGNOSIS — G4733 Obstructive sleep apnea (adult) (pediatric): Secondary | ICD-10-CM

## 2021-10-07 DIAGNOSIS — Z825 Family history of asthma and other chronic lower respiratory diseases: Secondary | ICD-10-CM | POA: Diagnosis not present

## 2021-10-07 DIAGNOSIS — Z888 Allergy status to other drugs, medicaments and biological substances status: Secondary | ICD-10-CM

## 2021-10-07 DIAGNOSIS — Z7984 Long term (current) use of oral hypoglycemic drugs: Secondary | ICD-10-CM

## 2021-10-07 DIAGNOSIS — K219 Gastro-esophageal reflux disease without esophagitis: Secondary | ICD-10-CM | POA: Diagnosis present

## 2021-10-07 DIAGNOSIS — I10 Essential (primary) hypertension: Secondary | ICD-10-CM

## 2021-10-07 DIAGNOSIS — Z7985 Long-term (current) use of injectable non-insulin antidiabetic drugs: Secondary | ICD-10-CM

## 2021-10-07 DIAGNOSIS — E785 Hyperlipidemia, unspecified: Secondary | ICD-10-CM | POA: Diagnosis not present

## 2021-10-07 DIAGNOSIS — M797 Fibromyalgia: Secondary | ICD-10-CM | POA: Diagnosis present

## 2021-10-07 DIAGNOSIS — M069 Rheumatoid arthritis, unspecified: Secondary | ICD-10-CM | POA: Diagnosis not present

## 2021-10-07 DIAGNOSIS — T148XXA Other injury of unspecified body region, initial encounter: Secondary | ICD-10-CM | POA: Diagnosis present

## 2021-10-07 DIAGNOSIS — Z9989 Dependence on other enabling machines and devices: Secondary | ICD-10-CM | POA: Diagnosis not present

## 2021-10-07 DIAGNOSIS — Z86010 Personal history of colonic polyps: Secondary | ICD-10-CM | POA: Diagnosis not present

## 2021-10-07 DIAGNOSIS — Z90710 Acquired absence of both cervix and uterus: Secondary | ICD-10-CM | POA: Diagnosis not present

## 2021-10-07 DIAGNOSIS — Z79899 Other long term (current) drug therapy: Secondary | ICD-10-CM

## 2021-10-07 DIAGNOSIS — E213 Hyperparathyroidism, unspecified: Secondary | ICD-10-CM | POA: Diagnosis not present

## 2021-10-07 DIAGNOSIS — T8130XA Disruption of wound, unspecified, initial encounter: Secondary | ICD-10-CM | POA: Diagnosis not present

## 2021-10-07 HISTORY — PX: LUMBAR WOUND DEBRIDEMENT: SHX1988

## 2021-10-07 LAB — CBC
HCT: 34.1 % — ABNORMAL LOW (ref 36.0–46.0)
Hemoglobin: 10.7 g/dL — ABNORMAL LOW (ref 12.0–15.0)
MCH: 27.9 pg (ref 26.0–34.0)
MCHC: 31.4 g/dL (ref 30.0–36.0)
MCV: 89 fL (ref 80.0–100.0)
Platelets: 386 10*3/uL (ref 150–400)
RBC: 3.83 MIL/uL — ABNORMAL LOW (ref 3.87–5.11)
RDW: 14.6 % (ref 11.5–15.5)
WBC: 9.9 10*3/uL (ref 4.0–10.5)
nRBC: 0 % (ref 0.0–0.2)

## 2021-10-07 LAB — CREATININE, SERUM
Creatinine, Ser: 1.07 mg/dL — ABNORMAL HIGH (ref 0.44–1.00)
GFR, Estimated: 57 mL/min — ABNORMAL LOW (ref >60)

## 2021-10-07 LAB — GLUCOSE, CAPILLARY
Glucose-Capillary: 115 mg/dL — ABNORMAL HIGH (ref 70–99)
Glucose-Capillary: 91 mg/dL (ref 70–99)
Glucose-Capillary: 135 mg/dL — ABNORMAL HIGH (ref 70–99)
Glucose-Capillary: 193 mg/dL — ABNORMAL HIGH (ref 70–99)

## 2021-10-07 SURGERY — LUMBAR WOUND DEBRIDEMENT
Anesthesia: General

## 2021-10-07 MED ORDER — HYDROCHLOROTHIAZIDE 12.5 MG PO TABS: 12.5000 mg | ORAL_TABLET | Freq: Every day | ORAL | Status: DC

## 2021-10-07 MED ORDER — IRBESARTAN 150 MG PO TABS
75.0000 mg | ORAL_TABLET | Freq: Every day | ORAL | Status: DC
  Administered 2021-10-07: 75 mg via ORAL
  Filled 2021-10-07: qty 1

## 2021-10-07 MED ORDER — HYDROMORPHONE HCL 1 MG/ML IJ SOLN
INTRAMUSCULAR | Status: AC
  Filled 2021-10-07: qty 1

## 2021-10-07 MED ORDER — FENTANYL CITRATE (PF) 250 MCG/5ML IJ SOLN
INTRAMUSCULAR | Status: AC
  Filled 2021-10-07: qty 5

## 2021-10-07 MED ORDER — ROCURONIUM BROMIDE 10 MG/ML (PF) SYRINGE
PREFILLED_SYRINGE | INTRAVENOUS | Status: DC | PRN
  Administered 2021-10-07: 20 mg via INTRAVENOUS
  Administered 2021-10-07: 60 mg via INTRAVENOUS

## 2021-10-07 MED ORDER — BUPIVACAINE HCL (PF) 0.5 % IJ SOLN
INTRAMUSCULAR | Status: AC
  Filled 2021-10-07: qty 30

## 2021-10-07 MED ORDER — FUROSEMIDE 40 MG PO TABS
40.0000 mg | ORAL_TABLET | Freq: Two times a day (BID) | ORAL | Status: DC
  Administered 2021-10-08: 40 mg via ORAL
  Filled 2021-10-07: qty 1

## 2021-10-07 MED ORDER — SERTRALINE HCL 50 MG PO TABS
25.0000 mg | ORAL_TABLET | Freq: Every day | ORAL | Status: DC
  Administered 2021-10-07 – 2021-10-08 (×2): 25 mg via ORAL
  Filled 2021-10-07 (×2): qty 1

## 2021-10-07 MED ORDER — CALCIUM CARBONATE 1250 (500 CA) MG PO TABS
1250.0000 mg | ORAL_TABLET | Freq: Two times a day (BID) | ORAL | Status: DC
  Administered 2021-10-08: 1250 mg via ORAL
  Filled 2021-10-07 (×2): qty 1

## 2021-10-07 MED ORDER — POTASSIUM CHLORIDE IN NACL 20-0.9 MEQ/L-% IV SOLN: INTRAVENOUS | Status: DC

## 2021-10-07 MED ORDER — FENTANYL CITRATE (PF) 250 MCG/5ML IJ SOLN
INTRAMUSCULAR | Status: DC | PRN
Start: 2021-10-07 — End: 2021-10-07
  Administered 2021-10-07 (×5): 50 ug via INTRAVENOUS

## 2021-10-07 MED ORDER — CHLORHEXIDINE GLUCONATE 0.12 % MT SOLN: 15.0000 mL | Freq: Once | OROMUCOSAL | Status: AC

## 2021-10-07 MED ORDER — SUGAMMADEX SODIUM 200 MG/2ML IV SOLN
INTRAVENOUS | Status: DC | PRN
  Administered 2021-10-07: 400 mg via INTRAVENOUS

## 2021-10-07 MED ORDER — DEXAMETHASONE SODIUM PHOSPHATE 10 MG/ML IJ SOLN
INTRAMUSCULAR | Status: DC | PRN
  Administered 2021-10-07: 10 mg via INTRAVENOUS

## 2021-10-07 MED ORDER — ACETAMINOPHEN 500 MG PO TABS
1000.0000 mg | ORAL_TABLET | Freq: Once | ORAL | Status: AC
  Administered 2021-10-07: 1000 mg via ORAL

## 2021-10-07 MED ORDER — ACETAMINOPHEN 500 MG PO TABS
ORAL_TABLET | ORAL | Status: AC
  Filled 2021-10-07: qty 2

## 2021-10-07 MED ORDER — PROPOFOL 10 MG/ML IV BOLUS
INTRAVENOUS | Status: DC | PRN
  Administered 2021-10-07: 40 mg via INTRAVENOUS
  Administered 2021-10-07: 160 mg via INTRAVENOUS

## 2021-10-07 MED ORDER — PROPOFOL 10 MG/ML IV BOLUS
INTRAVENOUS | Status: AC
  Filled 2021-10-07: qty 20

## 2021-10-07 MED ORDER — ACETAMINOPHEN 650 MG RE SUPP: 650.0000 mg | RECTAL | Status: DC | PRN

## 2021-10-07 MED ORDER — VANCOMYCIN HCL 1000 MG IV SOLR
INTRAVENOUS | Status: AC
  Filled 2021-10-07: qty 20

## 2021-10-07 MED ORDER — FAMOTIDINE 20 MG PO TABS
40.0000 mg | ORAL_TABLET | Freq: Every day | ORAL | Status: DC
  Administered 2021-10-07 – 2021-10-08 (×2): 40 mg via ORAL
  Filled 2021-10-07 (×2): qty 2

## 2021-10-07 MED ORDER — HEPARIN SODIUM (PORCINE) 5000 UNIT/ML IJ SOLN
5000.0000 [IU] | Freq: Three times a day (TID) | INTRAMUSCULAR | Status: DC
  Administered 2021-10-07 – 2021-10-08 (×2): 5000 [IU] via SUBCUTANEOUS
  Filled 2021-10-07 (×2): qty 1

## 2021-10-07 MED ORDER — OXYCODONE HCL 5 MG PO TABS: 5.0000 mg | ORAL_TABLET | Freq: Once | ORAL | Status: DC | PRN

## 2021-10-07 MED ORDER — FLUTICASONE PROPIONATE 50 MCG/ACT NA SUSP
2.0000 | Freq: Every day | NASAL | Status: DC | PRN
  Filled 2021-10-07: qty 16

## 2021-10-07 MED ORDER — CHLORHEXIDINE GLUCONATE 0.12 % MT SOLN
OROMUCOSAL | Status: AC
  Administered 2021-10-07: 15 mL via OROMUCOSAL
  Filled 2021-10-07: qty 15

## 2021-10-07 MED ORDER — SODIUM CHLORIDE 0.9 % IV SOLN: 250.0000 mL | INTRAVENOUS | Status: DC

## 2021-10-07 MED ORDER — ALBUTEROL SULFATE (2.5 MG/3ML) 0.083% IN NEBU
2.5000 mg | INHALATION_SOLUTION | Freq: Four times a day (QID) | RESPIRATORY_TRACT | Status: DC | PRN
Start: 2021-10-07 — End: 2021-10-08

## 2021-10-07 MED ORDER — VITAMIN D 25 MCG (1000 UNIT) PO TABS
1000.0000 [IU] | ORAL_TABLET | Freq: Every day | ORAL | Status: DC
  Administered 2021-10-07 – 2021-10-08 (×2): 1000 [IU] via ORAL
  Filled 2021-10-07 (×2): qty 1

## 2021-10-07 MED ORDER — LACTATED RINGERS IV SOLN: INTRAVENOUS | Status: DC

## 2021-10-07 MED ORDER — PHENYLEPHRINE 80 MCG/ML (10ML) SYRINGE FOR IV PUSH (FOR BLOOD PRESSURE SUPPORT)
PREFILLED_SYRINGE | INTRAVENOUS | Status: AC
  Filled 2021-10-07: qty 10

## 2021-10-07 MED ORDER — OXYCODONE HCL 5 MG PO TABS
10.0000 mg | ORAL_TABLET | ORAL | Status: DC | PRN
  Administered 2021-10-07 – 2021-10-08 (×3): 10 mg via ORAL
  Filled 2021-10-07 (×3): qty 2

## 2021-10-07 MED ORDER — MIDAZOLAM HCL 2 MG/2ML IJ SOLN
INTRAMUSCULAR | Status: AC
  Filled 2021-10-07: qty 2

## 2021-10-07 MED ORDER — ALBUTEROL SULFATE HFA 108 (90 BASE) MCG/ACT IN AERS: 2.0000 | INHALATION_SPRAY | Freq: Four times a day (QID) | RESPIRATORY_TRACT | Status: DC | PRN

## 2021-10-07 MED ORDER — BUPIVACAINE HCL (PF) 0.5 % IJ SOLN
INTRAMUSCULAR | Status: DC | PRN
  Administered 2021-10-07: 30 mL

## 2021-10-07 MED ORDER — KETOROLAC TROMETHAMINE 15 MG/ML IJ SOLN
7.5000 mg | Freq: Four times a day (QID) | INTRAMUSCULAR | Status: DC
  Administered 2021-10-07 – 2021-10-08 (×3): 7.5 mg via INTRAVENOUS
  Filled 2021-10-07 (×3): qty 1

## 2021-10-07 MED ORDER — DEXAMETHASONE SODIUM PHOSPHATE 10 MG/ML IJ SOLN
INTRAMUSCULAR | Status: AC
  Filled 2021-10-07: qty 1

## 2021-10-07 MED ORDER — ROCURONIUM BROMIDE 10 MG/ML (PF) SYRINGE
PREFILLED_SYRINGE | INTRAVENOUS | Status: AC
  Filled 2021-10-07: qty 10

## 2021-10-07 MED ORDER — MENTHOL 3 MG MT LOZG: 1.0000 | LOZENGE | OROMUCOSAL | Status: DC | PRN

## 2021-10-07 MED ORDER — MORPHINE SULFATE (PF) 2 MG/ML IV SOLN: 2.0000 mg | INTRAVENOUS | Status: DC | PRN

## 2021-10-07 MED ORDER — CHLORHEXIDINE GLUCONATE CLOTH 2 % EX PADS: 6.0000 | MEDICATED_PAD | Freq: Once | CUTANEOUS | Status: DC

## 2021-10-07 MED ORDER — SODIUM CHLORIDE 0.9% FLUSH: 3.0000 mL | INTRAVENOUS | Status: DC | PRN

## 2021-10-07 MED ORDER — ZOLPIDEM TARTRATE 5 MG PO TABS: 5.0000 mg | ORAL_TABLET | Freq: Every evening | ORAL | Status: DC | PRN

## 2021-10-07 MED ORDER — PHENOL 1.4 % MT LIQD: 1.0000 | OROMUCOSAL | Status: DC | PRN

## 2021-10-07 MED ORDER — AMISULPRIDE (ANTIEMETIC) 5 MG/2ML IV SOLN: 10.0000 mg | Freq: Once | INTRAVENOUS | Status: DC | PRN

## 2021-10-07 MED ORDER — ORAL CARE MOUTH RINSE: 15.0000 mL | Freq: Once | OROMUCOSAL | Status: AC

## 2021-10-07 MED ORDER — DIAZEPAM 5 MG PO TABS
5.0000 mg | ORAL_TABLET | Freq: Four times a day (QID) | ORAL | Status: DC | PRN
  Administered 2021-10-07 – 2021-10-08 (×2): 5 mg via ORAL
  Filled 2021-10-07 (×2): qty 1

## 2021-10-07 MED ORDER — LIDOCAINE 2% (20 MG/ML) 5 ML SYRINGE
INTRAMUSCULAR | Status: DC | PRN
  Administered 2021-10-07: 60 mg via INTRAVENOUS

## 2021-10-07 MED ORDER — ONDANSETRON HCL 4 MG/2ML IJ SOLN
INTRAMUSCULAR | Status: AC
  Filled 2021-10-07: qty 2

## 2021-10-07 MED ORDER — OXYCODONE HCL ER 15 MG PO T12A
15.0000 mg | EXTENDED_RELEASE_TABLET | Freq: Two times a day (BID) | ORAL | Status: DC
  Administered 2021-10-07 – 2021-10-08 (×2): 15 mg via ORAL
  Filled 2021-10-07 (×2): qty 1

## 2021-10-07 MED ORDER — OXYCODONE HCL 5 MG/5ML PO SOLN: 5.0000 mg | Freq: Once | ORAL | Status: DC | PRN

## 2021-10-07 MED ORDER — PHENYLEPHRINE 80 MCG/ML (10ML) SYRINGE FOR IV PUSH (FOR BLOOD PRESSURE SUPPORT): PREFILLED_SYRINGE | INTRAVENOUS | Status: DC | PRN

## 2021-10-07 MED ORDER — HYDROMORPHONE HCL 1 MG/ML IJ SOLN
0.2500 mg | INTRAMUSCULAR | Status: DC | PRN
  Administered 2021-10-07: 0.25 mg via INTRAVENOUS
  Administered 2021-10-07: 0.5 mg via INTRAVENOUS
  Administered 2021-10-07 (×2): 0.25 mg via INTRAVENOUS

## 2021-10-07 MED ORDER — ACETAMINOPHEN 325 MG PO TABS
650.0000 mg | ORAL_TABLET | ORAL | Status: DC | PRN
  Administered 2021-10-08: 650 mg via ORAL
  Filled 2021-10-07: qty 2

## 2021-10-07 MED ORDER — ONDANSETRON HCL 4 MG PO TABS: 4.0000 mg | ORAL_TABLET | Freq: Four times a day (QID) | ORAL | Status: DC | PRN

## 2021-10-07 MED ORDER — METFORMIN HCL 500 MG PO TABS
500.0000 mg | ORAL_TABLET | Freq: Two times a day (BID) | ORAL | Status: DC
Start: 2021-10-08 — End: 2021-10-08
  Administered 2021-10-07 – 2021-10-08 (×2): 500 mg via ORAL
  Filled 2021-10-07 (×2): qty 1

## 2021-10-07 MED ORDER — INSULIN ASPART 100 UNIT/ML IJ SOLN: 0.0000 [IU] | INTRAMUSCULAR | Status: DC | PRN

## 2021-10-07 MED ORDER — LIDOCAINE 2% (20 MG/ML) 5 ML SYRINGE
INTRAMUSCULAR | Status: AC
  Filled 2021-10-07: qty 5

## 2021-10-07 MED ORDER — ONDANSETRON HCL 4 MG/2ML IJ SOLN: 4.0000 mg | Freq: Once | INTRAMUSCULAR | Status: DC | PRN

## 2021-10-07 MED ORDER — OXYCODONE HCL 5 MG PO TABS: 5.0000 mg | ORAL_TABLET | ORAL | Status: DC | PRN

## 2021-10-07 MED ORDER — DOUBLE ANTIBIOTIC 500-10000 UNIT/GM EX OINT
TOPICAL_OINTMENT | CUTANEOUS | Status: AC
  Filled 2021-10-07: qty 28.4

## 2021-10-07 MED ORDER — VANCOMYCIN HCL IN DEXTROSE 1-5 GM/200ML-% IV SOLN
INTRAVENOUS | Status: AC
  Filled 2021-10-07: qty 200

## 2021-10-07 MED ORDER — 0.9 % SODIUM CHLORIDE (POUR BTL) OPTIME
TOPICAL | Status: DC | PRN
  Administered 2021-10-07: 1000 mL

## 2021-10-07 MED ORDER — MIDAZOLAM HCL 2 MG/2ML IJ SOLN
INTRAMUSCULAR | Status: DC | PRN
  Administered 2021-10-07: 2 mg via INTRAVENOUS

## 2021-10-07 MED ORDER — BUPROPION HCL ER (SR) 150 MG PO TB12
150.0000 mg | ORAL_TABLET | Freq: Every day | ORAL | Status: DC
  Administered 2021-10-07 – 2021-10-08 (×2): 150 mg via ORAL
  Filled 2021-10-07 (×2): qty 1

## 2021-10-07 MED ORDER — ACETAMINOPHEN 10 MG/ML IV SOLN: 1000.0000 mg | Freq: Once | INTRAVENOUS | Status: DC | PRN

## 2021-10-07 MED ORDER — ONDANSETRON HCL 4 MG/2ML IJ SOLN: 4.0000 mg | Freq: Four times a day (QID) | INTRAMUSCULAR | Status: DC | PRN

## 2021-10-07 MED ORDER — SODIUM CHLORIDE 0.9% FLUSH: 3.0000 mL | Freq: Two times a day (BID) | INTRAVENOUS | Status: DC

## 2021-10-07 MED ORDER — THROMBIN 5000 UNITS EX SOLR: CUTANEOUS | Status: DC | PRN

## 2021-10-07 MED ORDER — VANCOMYCIN HCL 1250 MG/250ML IV SOLN
1250.0000 mg | Freq: Once | INTRAVENOUS | Status: AC
  Administered 2021-10-08: 1250 mg via INTRAVENOUS
  Filled 2021-10-07: qty 250

## 2021-10-07 MED ORDER — ONDANSETRON HCL 4 MG/2ML IJ SOLN
INTRAMUSCULAR | Status: DC | PRN
Start: 2021-10-07 — End: 2021-10-07
  Administered 2021-10-07: 4 mg via INTRAVENOUS

## 2021-10-07 MED ORDER — VANCOMYCIN HCL IN DEXTROSE 1-5 GM/200ML-% IV SOLN
1000.0000 mg | INTRAVENOUS | Status: AC
  Administered 2021-10-07: 1000 mg via INTRAVENOUS

## 2021-10-07 MED ORDER — THROMBIN 5000 UNITS EX SOLR
CUTANEOUS | Status: AC
  Filled 2021-10-07: qty 10000

## 2021-10-07 SURGICAL SUPPLY — 63 items
ADH SKN CLS APL DERMABOND .7 (GAUZE/BANDAGES/DRESSINGS) ×1
APL SKNCLS STERI-STRIP NONHPOA (GAUZE/BANDAGES/DRESSINGS)
BAG COUNTER SPONGE SURGICOUNT (BAG) ×2 IMPLANT
BAG SPNG CNTER NS LX DISP (BAG) ×1
BENZOIN TINCTURE PRP APPL 2/3 (GAUZE/BANDAGES/DRESSINGS) IMPLANT
BLADE CLIPPER SURG (BLADE) IMPLANT
CANISTER SUCT 3000ML PPV (MISCELLANEOUS) ×2 IMPLANT
CARTRIDGE OIL MAESTRO DRILL (MISCELLANEOUS) ×1 IMPLANT
DECANTER SPIKE VIAL GLASS SM (MISCELLANEOUS) ×2 IMPLANT
DERMABOND ADVANCED (GAUZE/BANDAGES/DRESSINGS) ×1
DERMABOND ADVANCED .7 DNX12 (GAUZE/BANDAGES/DRESSINGS) IMPLANT
DIFFUSER DRILL AIR PNEUMATIC (MISCELLANEOUS) ×2 IMPLANT
DRAPE LAPAROTOMY 100X72X124 (DRAPES) ×2 IMPLANT
DRAPE SURG 17X23 STRL (DRAPES) ×2 IMPLANT
DRSG OPSITE POSTOP 4X6 (GAUZE/BANDAGES/DRESSINGS) ×1 IMPLANT
DURAPREP 26ML APPLICATOR (WOUND CARE) ×2 IMPLANT
ELECT REM PT RETURN 9FT ADLT (ELECTROSURGICAL) ×2
ELECTRODE REM PT RTRN 9FT ADLT (ELECTROSURGICAL) ×1 IMPLANT
GAUZE 4X4 16PLY ~~LOC~~+RFID DBL (SPONGE) IMPLANT
GAUZE SPONGE 4X4 12PLY STRL (GAUZE/BANDAGES/DRESSINGS) IMPLANT
GLOVE BIO SURGEON STRL SZ 6.5 (GLOVE) IMPLANT
GLOVE BIO SURGEON STRL SZ7 (GLOVE) IMPLANT
GLOVE BIO SURGEON STRL SZ7.5 (GLOVE) IMPLANT
GLOVE BIO SURGEON STRL SZ8 (GLOVE) IMPLANT
GLOVE BIO SURGEON STRL SZ8.5 (GLOVE) IMPLANT
GLOVE BIOGEL M 8.0 STRL (GLOVE) IMPLANT
GLOVE ECLIPSE 6.5 STRL STRAW (GLOVE) ×2 IMPLANT
GLOVE ECLIPSE 7.0 STRL STRAW (GLOVE) IMPLANT
GLOVE ECLIPSE 7.5 STRL STRAW (GLOVE) IMPLANT
GLOVE ECLIPSE 8.0 STRL XLNG CF (GLOVE) IMPLANT
GLOVE ECLIPSE 8.5 STRL (GLOVE) IMPLANT
GLOVE EXAM NITRILE XL STR (GLOVE) IMPLANT
GLOVE INDICATOR 6.5 STRL GRN (GLOVE) IMPLANT
GLOVE INDICATOR 7.0 STRL GRN (GLOVE) IMPLANT
GLOVE INDICATOR 7.5 STRL GRN (GLOVE) IMPLANT
GLOVE INDICATOR 8.0 STRL GRN (GLOVE) IMPLANT
GLOVE INDICATOR 8.5 STRL (GLOVE) IMPLANT
GLOVE OPTIFIT SS 8.0 STRL (GLOVE) IMPLANT
GLOVE SURG SS PI 6.5 STRL IVOR (GLOVE) IMPLANT
GOWN STRL REUS W/ TWL LRG LVL3 (GOWN DISPOSABLE) ×2 IMPLANT
GOWN STRL REUS W/ TWL XL LVL3 (GOWN DISPOSABLE) IMPLANT
GOWN STRL REUS W/TWL 2XL LVL3 (GOWN DISPOSABLE) IMPLANT
GOWN STRL REUS W/TWL LRG LVL3 (GOWN DISPOSABLE) ×4
GOWN STRL REUS W/TWL XL LVL3 (GOWN DISPOSABLE)
KIT BASIN OR (CUSTOM PROCEDURE TRAY) ×2 IMPLANT
KIT TURNOVER KIT B (KITS) ×2 IMPLANT
NS IRRIG 1000ML POUR BTL (IV SOLUTION) ×2 IMPLANT
OIL CARTRIDGE MAESTRO DRILL (MISCELLANEOUS) ×2
PACK LAMINECTOMY NEURO (CUSTOM PROCEDURE TRAY) ×2 IMPLANT
PAD ARMBOARD 7.5X6 YLW CONV (MISCELLANEOUS) ×6 IMPLANT
SPONGE SURGIFOAM ABS GEL SZ50 (HEMOSTASIS) ×2 IMPLANT
SPONGE T-LAP 4X18 ~~LOC~~+RFID (SPONGE) IMPLANT
STRIP CLOSURE SKIN 1/2X4 (GAUZE/BANDAGES/DRESSINGS) IMPLANT
SUT ETHILON 3 0 FSL (SUTURE) ×2 IMPLANT
SUT VIC AB 0 CT1 18XCR BRD8 (SUTURE) ×1 IMPLANT
SUT VIC AB 0 CT1 8-18 (SUTURE) ×6
SUT VIC AB 2-0 CT1 18 (SUTURE) ×2 IMPLANT
SUT VIC AB 3-0 SH 8-18 (SUTURE) ×2 IMPLANT
SWAB COLLECTION DEVICE MRSA (MISCELLANEOUS) IMPLANT
SWAB CULTURE ESWAB REG 1ML (MISCELLANEOUS) IMPLANT
TOWEL GREEN STERILE (TOWEL DISPOSABLE) ×2 IMPLANT
TOWEL GREEN STERILE FF (TOWEL DISPOSABLE) ×2 IMPLANT
WATER STERILE IRR 1000ML POUR (IV SOLUTION) ×2 IMPLANT

## 2021-10-07 NOTE — Anesthesia Postprocedure Evaluation (Signed)
Anesthesia Post Note ? ?Patient: [name] ? ?Procedure(s) Performed: LUMBAR WOUND DEBRIDEMENT/REVISION ? ?  ? ?Patient location during evaluation: PACU ?Anesthesia Type: General ?Level of consciousness: awake and alert ?Pain management: pain level controlled ?Vital Signs Assessment: post-procedure vital signs reviewed and stable ?Respiratory status: spontaneous breathing, nonlabored ventilation and respiratory function stable ?Cardiovascular status: blood pressure returned to baseline and stable ?Postop Assessment: no apparent nausea or vomiting ?Anesthetic complications: no ? ? ?No notable events documented. ? ?Last Vitals:  ?Vitals:  ? 10/07/21 1700 10/07/21 1715  ?BP: (!) 149/69 (!) 160/90  ?Pulse: 92 88  ?Resp: 20 15  ?Temp: 37.1 ?C   ?SpO2: 95% 96%  ?  ?Last Pain:  ?Vitals:  ? 10/07/21 1715  ?TempSrc:   ?PainSc: 8   ? ? ?  ?  ?  ?  ?  ?  ? ?Sayre Witherington,W. EDMOND ? ? ? ? ?

## 2021-10-07 NOTE — Anesthesia Procedure Notes (Signed)
Procedure Name: Intubation ?Date/Time: 10/07/2021 3:22 PM ?Performed by: Dorthea Cove, CRNA ?Pre-anesthesia Checklist: Patient identified, Emergency Drugs available, Suction available and Patient being monitored ?Patient Re-evaluated:Patient Re-evaluated prior to induction ?Oxygen Delivery Method: Circle system utilized ?Preoxygenation: Pre-oxygenation with 100% oxygen ?Induction Type: IV induction ?Ventilation: Mask ventilation without difficulty ?Laryngoscope Size: Mac and 3 ?Grade View: Grade II ?Tube type: Oral ?Tube size: 7.0 mm ?Number of attempts: 1 ?Airway Equipment and Method: Stylet and Oral airway ?Placement Confirmation: ETT inserted through vocal cords under direct vision, positive ETCO2 and breath sounds checked- equal and bilateral ?Secured at: 23 cm ?Tube secured with: Tape ?Dental Injury: Teeth and Oropharynx as per pre-operative assessment  ?Difficulty Due To: Difficulty was anticipated, Difficult Airway- due to large tongue and Difficult Airway- due to limited oral opening ?Comments: Grade IIb view due to small oral opening and large tongue.  ? ? ? ? ?

## 2021-10-07 NOTE — Progress Notes (Signed)
Pharmacy Antibiotic Note ? ?[name] is a 66 y.o. female admitted on 10/07/2021 s/p wound debridement.  Pharmacy has been consulted for vancomycin dosing (for surgical prophylaxis) ?-1gm IV vancomycin given at 3:45pm ?-SCr= 1.07 ? ?Plan: ?-vancomycin 1250 mg IV at 3:30am x1 dose ?-Will sign off. Please contact pharmacy with any other needs. ? ?Thank you ?Hildred Laser, PharmD ?Clinical Pharmacist ?**Pharmacist phone directory can now be found on [email or web address] ([email or web address]).  Listed under [facility]. ? ?e ?

## 2021-10-07 NOTE — Transfer of Care (Signed)
Immediate Anesthesia Transfer of Care Note ? ?Patient: [name] ? ?Procedure(s) Performed: LUMBAR WOUND DEBRIDEMENT/REVISION ? ?Patient Location: PACU ? ?Anesthesia Type:General ? ?Level of Consciousness: awake and drowsy ? ?Airway & Oxygen Therapy: Patient Spontanous Breathing and Patient connected to face mask oxygen ? ?Post-op Assessment: Report given to RN and Post -op Vital signs reviewed and stable ? ?Post vital signs: Reviewed and stable ? ?Last Vitals: SEE PACU Vital Signs ?Vitals Value Taken Time  ?BP    ?Temp    ?Pulse    ?Resp 16   ?SpO2 100   ? ? ?Last Pain:  ?Vitals:  ? 10/07/21 1236  ?TempSrc:   ?PainSc: 0-No pain  ?   ? ?  ? ?Complications: No notable events documented. ?

## 2021-10-07 NOTE — Op Note (Signed)
10/07/2021 ? ?5:26 PM ? ?PATIENT:  [name]  66 y.o. female with a persistently draining wound. She is here today for a wound revision and closure.  ? ?PRE-OPERATIVE DIAGNOSIS:  Wound drainage ? ?POST-OPERATIVE DIAGNOSIS:  Wound drainage ? ?PROCEDURE:  Procedure(s): ?LUMBAR WOUND DEBRIDEMENT/REVISION ? ?SURGEON: Surgeon(s): ?Ashok Pall, MD ? ?ASSISTANTS:none ? ?ANESTHESIA:   general ? ?EBL:  Total I/O ?In: 1200 [I.V.:1000; IV Piggyback:200] ?Out: 10 [Blood:10] ? ?BLOOD ADMINISTERED:none ? ?CELL SAVER GIVEN:not used ? ?COUNT:correctt ? ?DRAINS: none  ? ?SPECIMEN:  Source of Specimen:  wound  ? ?DICTATION: [name] was taken to the operating room, intubated, and placed under a general anesthetic without difficulty. She was positioned prone on a wilson frame. Her previous incision was prepped and draped in a sterile manner.  ?I opened the superior 3/5 of the incision as the lower portion was closed. I used scissors to open, and was able to open the thoracolumbar fascia. I took samples of the serous sanguineous fluid. There was no purulence. I irrigated 2 liters. I closed the wound in layers. I approximated the thoracolumbar fascia, subcutaneous, and subcuticular planes with vicryl. I approximated the skin edges with nylon  ?PLAN OF CARE: Admit for overnight observation ? ?PATIENT DISPOSITION:  PACU - hemodynamically stable. ?  ?Delay start of Pharmacological VTE agent (>24hrs) due to surgical blood loss or risk of bleeding:  yes ? ?  ?

## 2021-10-07 NOTE — H&P (Signed)
BP 121/63   Pulse 85   Temp 98.3 ?F (36.8 ?C) (Oral)   Resp 20   Ht '5\' 6"'  (1.676 m)   Wt 117.9 kg   SpO2 98%   BMI 41.97 kg/m?  ?[name] comes in today for persistent drainage from her incision. She underwent a lumbar arthrodesis on 4/26 and was sent home promptly. She has no medical complications, but this persistent drainage from the wound necessitates a closure in the OR. I have placed sutures in the outpatient setting without resolving the problem.  ?Allergies  ?Allergen Reactions  ? Allopurinol Hives  ? Mucinex [Guaifenesin Er] Shortness Of Breath  ? Penicillins Hives and Other (See Comments)  ?  Has patient had a PCN reaction causing immediate rash, facial/tongue/throat swelling, SOB or lightheadedness with hypotension: no ?Has patient had a PCN reaction causing severe rash involving mucus membranes or skin necrosis: no ?Has patient had a PCN reaction that required hospitalization no ?Has patient had a PCN reaction occurring within the last 10 years: no ?If all of the above answers are "NO", then may proceed with Cephalosporin use. ?  ? Prozac [Fluoxetine Hcl] Hives  ? Amitriptyline Other (See Comments)  ?  "felt weird, fatigue, dizziness"  ? Lexapro [Escitalopram Oxalate]   ?  Nausea and hypersalivation.   ? ?Past Medical History:  ?Diagnosis Date  ? Allergic rhinitis   ? Allergy   ? Anemia 07/17/2016  ? Anxiety   ? Asthma   ? seasonal  ? Blood transfusion without reported diagnosis   ? Breast cancer ([place])   ? right  ? Bronchitis   ? Colon polyps   ? CPAP (continuous positive airway pressure) dependence   ? Depression   ? Diabetes mellitus   ? borderline but takes metformin  ? Edema   ? Family history of breast cancer in first degree relative   ? Fibromyalgia   ? GERD (gastroesophageal reflux disease)   ? Hyperglycemia 03/05/2017  ? Hyperlipidemia 08/21/2016  ? no meds  ? Hyperparathyroidism   ? Hypertension   ? controlled by medications  ? Joint pain   ? Neuromuscular disorder ([place])   ? Fibromyalgia  ?  Obesity   ? Osteoarthritis   ? RA  ? PONV (postoperative nausea and vomiting)   ? occasional  ? Pre-diabetes   ? Sleep apnea   ? wears cpap  ? Viral meningitis   ? Vitamin D deficiency   ? ?Past Surgical History:  ?Procedure Laterality Date  ? ABDOMINAL HYSTERECTOMY  1998  ? ADENOIDECTOMY    ? BACK SURGERY    ? 09/21/21  ? BREAST EXCISIONAL BIOPSY Left 1993  ? benign  ? BREAST SURGERY    ? Tran flap due to breast cancer  ? [place]  ? COLONOSCOPY  08/14/2011  ? HAND SURGERY    ? dog bite, right hand  ? HAND SURGERY    ? trauma, left hand  ? KNEE ARTHROSCOPY    ? bilateral  ? left knee replacement  2010  ? MASTECTOMY  01/13/1994  ? Right breast  ? parathyroid resection    ? PARATHYROIDECTOMY    ? POLYPECTOMY    ? rectal abscess    ? SEPTOPLASTY  1980  ? SHOULDER ARTHROSCOPY Right 11/09/2015  ? Procedure: ARTHROSCOPY SHOULDER-acromioplasty, distal clavicle resection and debridement;  Surgeon: Melrose Nakayama, MD;  Location: [facility];  Service: Orthopedics;  Laterality: Right;  ? shoulder arthroscopy    ? rotator cuff repair  ?  TOE SURGERY Right   ? paronychia and adenoma removed  ? TONSILLECTOMY    ? TOTAL KNEE ARTHROPLASTY  06/18/2008  ? [place]  ? TUMOR REMOVAL  1982  ? , scalp  ? ?Family History  ?Problem Relation Age of Onset  ? Emphysema Father   ? Lung cancer Father   ?     lung ca dx 78  ? Other Father   ?     prostate issues  ? Diabetes Father   ? Breast cancer Maternal Aunt   ?     dx late 18s  ? Colon cancer Maternal Aunt   ?     dx 11s  ? Colon polyps Maternal Aunt   ? Prostate cancer Maternal Grandfather 68  ?     d. 85y  ? Heart disease Paternal Grandfather   ? Heart attack Paternal Grandfather   ?     d. 50y  ? Diabetes Paternal Grandfather   ? Asthma Daughter   ?     "seasonal"  ? Arthritis Maternal Grandmother   ? Aneurysm Maternal Grandmother   ?     d. brain aneurysm at 66  ? Arthritis Mother   ? Colon polyps Mother   ? Hypertension Mother   ? Sleep apnea Mother   ? Multiple sclerosis Sister    ? Breast cancer Sister 50  ?     L IDC and DCIS; ER/PR+, Her2-  ? Fibrocystic breast disease Sister   ? Arthritis/Rheumatoid Sister   ? Breast cancer Sister   ? Cancer Maternal Uncle   ?     d. mouth cancer at younger age; smoker  ? Other Paternal Uncle   ?     muscle issues - couldn't walk or talk; d. 20y  ? Cancer Maternal Aunt   ?     lymphoma, dx 30s  ? Ovarian cancer Maternal Aunt   ?     dx 63s; d. late 61s  ? Lung cancer Maternal Uncle   ?     d. 37y; former smoker  ? Throat cancer Cousin   ?     maternal 1st cousin; smoker  ? Breast cancer Cousin 47  ?     maternal 1st cousin  ? Other Paternal Uncle   ?     prostate issues  ? Breast cancer Cousin   ?     paternal 1st cousin dx late 61s  ? Throat cancer Cousin   ?     maternal 1st cousin; used SL tobacco  ? Prostate cancer Cousin   ?     maternal 1st cousin dx 25s  ? Esophageal cancer Neg Hx   ? Rectal cancer Neg Hx   ? Stomach cancer Neg Hx   ? ?Prior to Admission medications   ?Medication Sig Start Date End Date Taking? Authorizing Provider  ?acetaminophen (TYLENOL) 650 MG CR tablet Take 1,300 mg by mouth every 8 (eight) hours as needed for pain.   Yes [provider]  ?buPROPion (WELLBUTRIN SR) 150 MG 12 hr tablet TAKE 1 TABLET BY MOUTH DAILY 09/19/21  Yes Mosie Lukes, MD  ?calcium carbonate (OSCAL) 1500 (600 Ca) MG TABS tablet Take 1,500 mg by mouth 2 (two) times daily with a meal.   Yes [provider]  ?cholecalciferol (VITAMIN D3) 25 MCG (1000 UNIT) tablet Take 1,000 Units by mouth daily.   Yes [provider]  ?famotidine (PEPCID) 40 MG tablet Take 1 tablet (40 mg total)  by mouth daily. 12/11/18  Yes Mosie Lukes, MD  ?furosemide (LASIX) 40 MG tablet TAKE 1 TABLET BY MOUTH  TWICE DAILY ?Patient taking differently: Take 40 mg by mouth daily. 05/24/21  Yes Mosie Lukes, MD  ?hydrochlorothiazide (MICROZIDE) 12.5 MG capsule TAKE 1 CAPSULE BY MOUTH  DAILY ?Patient taking differently: Take 12.5 mg by mouth daily. 06/13/21   Yes Mosie Lukes, MD  ?metFORMIN (GLUCOPHAGE) 500 MG tablet TAKE 1 TABLET BY MOUTH  TWICE DAILY WITH A MEAL ?Patient taking differently: Take 500 mg by mouth 2 (two) times daily with a meal. 04/24/20  Yes Mosie Lukes, MD  ?Multiple Vitamins-Minerals (MULTIVITAMIN & MINERAL PO) Take 1 tablet by mouth daily.   Yes [provider]  ?oxyCODONE 10 MG TABS Take 1 tablet (10 mg total) by mouth every 3 (three) hours as needed for severe pain ((score 7 to 10)). 09/23/21  Yes Ashok Pall, MD  ?sertraline (ZOLOFT) 50 MG tablet TAKE ONE-HALF TABLET BY  MOUTH TWICE DAILY ?Patient taking differently: Take 25 mg by mouth daily. 07/26/21  Yes Mosie Lukes, MD  ?telmisartan (MICARDIS) 20 MG tablet TAKE 1 TABLET BY MOUTH  DAILY 06/13/21  Yes Mosie Lukes, MD  ?tiZANidine (ZANAFLEX) 4 MG tablet Take 1 tablet (4 mg total) by mouth every 6 (six) hours as needed for muscle spasms. 09/23/21  Yes Ashok Pall, MD  ?Zinc 30 MG CAPS Take 30 mg by mouth daily.   Yes [provider]  ?albuterol (VENTOLIN HFA) 108 (90 Base) MCG/ACT inhaler Inhale 2 puffs into the lungs every 6 (six) hours as needed. ?Patient taking differently: Inhale 2 puffs into the lungs every 6 (six) hours as needed for wheezing or shortness of breath. 04/05/21   Saguier, Percell Miller, PA-C  ?diclofenac Sodium (VOLTAREN) 1 % GEL Apply 2 g topically 4 (four) times daily as needed (pain).    [provider]  ?fluticasone (FLONASE) 50 MCG/ACT nasal spray Place 2 sprays into both nostrils daily. ?Patient taking differently: Place 2 sprays into both nostrils daily as needed for allergies. 10/28/20   Mosie Lukes, MD  ?Insulin Pen Needle 32G X 6 MM MISC Use with Victoza daily 09/13/20   [name] A, DO  ?traMADol (ULTRAM) 50 MG tablet Take 50-100 mg by mouth 3 (three) times daily as needed for pain. 08/25/21   [provider]  ? ?Social History  ? ?Socioeconomic History  ? Marital status: Widowed  ?  Spouse name: Not on file  ? Number  of children: 1  ? Years of education: Not on file  ? Highest education level: 12th grade  ?Occupational History  ? Occupation: ACCOUNTING  ?  Employer: [employer]  ?Tobacco Use  ? Smoking status: Leodis Binet

## 2021-10-08 ENCOUNTER — Encounter (HOSPITAL_COMMUNITY): Payer: Self-pay | Admitting: Neurosurgery

## 2021-10-08 DIAGNOSIS — E213 Hyperparathyroidism, unspecified: Secondary | ICD-10-CM | POA: Diagnosis present

## 2021-10-08 DIAGNOSIS — F32A Depression, unspecified: Secondary | ICD-10-CM | POA: Diagnosis present

## 2021-10-08 DIAGNOSIS — Z8261 Family history of arthritis: Secondary | ICD-10-CM | POA: Diagnosis not present

## 2021-10-08 DIAGNOSIS — Z96652 Presence of left artificial knee joint: Secondary | ICD-10-CM | POA: Diagnosis present

## 2021-10-08 DIAGNOSIS — Z8371 Family history of colonic polyps: Secondary | ICD-10-CM | POA: Diagnosis not present

## 2021-10-08 DIAGNOSIS — Z833 Family history of diabetes mellitus: Secondary | ICD-10-CM | POA: Diagnosis not present

## 2021-10-08 DIAGNOSIS — R7303 Prediabetes: Secondary | ICD-10-CM | POA: Diagnosis present

## 2021-10-08 DIAGNOSIS — E785 Hyperlipidemia, unspecified: Secondary | ICD-10-CM | POA: Diagnosis present

## 2021-10-08 DIAGNOSIS — Z803 Family history of malignant neoplasm of breast: Secondary | ICD-10-CM | POA: Diagnosis not present

## 2021-10-08 DIAGNOSIS — M797 Fibromyalgia: Secondary | ICD-10-CM | POA: Diagnosis present

## 2021-10-08 DIAGNOSIS — K219 Gastro-esophageal reflux disease without esophagitis: Secondary | ICD-10-CM | POA: Diagnosis present

## 2021-10-08 DIAGNOSIS — M069 Rheumatoid arthritis, unspecified: Secondary | ICD-10-CM | POA: Diagnosis present

## 2021-10-08 DIAGNOSIS — Y838 Other surgical procedures as the cause of abnormal reaction of the patient, or of later complication, without mention of misadventure at the time of the procedure: Secondary | ICD-10-CM | POA: Diagnosis present

## 2021-10-08 DIAGNOSIS — Z888 Allergy status to other drugs, medicaments and biological substances status: Secondary | ICD-10-CM | POA: Diagnosis not present

## 2021-10-08 DIAGNOSIS — G4730 Sleep apnea, unspecified: Secondary | ICD-10-CM | POA: Diagnosis present

## 2021-10-08 DIAGNOSIS — Z825 Family history of asthma and other chronic lower respiratory diseases: Secondary | ICD-10-CM | POA: Diagnosis not present

## 2021-10-08 DIAGNOSIS — Z880 Allergy status to penicillin: Secondary | ICD-10-CM | POA: Diagnosis not present

## 2021-10-08 DIAGNOSIS — Z981 Arthrodesis status: Secondary | ICD-10-CM | POA: Diagnosis not present

## 2021-10-08 DIAGNOSIS — E559 Vitamin D deficiency, unspecified: Secondary | ICD-10-CM | POA: Diagnosis present

## 2021-10-08 DIAGNOSIS — T8189XA Other complications of procedures, not elsewhere classified, initial encounter: Secondary | ICD-10-CM | POA: Diagnosis present

## 2021-10-08 DIAGNOSIS — Z86010 Personal history of colonic polyps: Secondary | ICD-10-CM | POA: Diagnosis not present

## 2021-10-08 DIAGNOSIS — Z90710 Acquired absence of both cervix and uterus: Secondary | ICD-10-CM | POA: Diagnosis not present

## 2021-10-08 DIAGNOSIS — Z8249 Family history of ischemic heart disease and other diseases of the circulatory system: Secondary | ICD-10-CM | POA: Diagnosis not present

## 2021-10-08 DIAGNOSIS — Z853 Personal history of malignant neoplasm of breast: Secondary | ICD-10-CM | POA: Diagnosis not present

## 2021-10-08 DIAGNOSIS — F419 Anxiety disorder, unspecified: Secondary | ICD-10-CM | POA: Diagnosis present

## 2021-10-08 LAB — GLUCOSE, CAPILLARY: Glucose-Capillary: 125 mg/dL — ABNORMAL HIGH (ref 70–99)

## 2021-10-08 MED ORDER — TIZANIDINE HCL 4 MG PO TABS: 4.0000 mg | ORAL_TABLET | Freq: Four times a day (QID) | ORAL | 0 refills | Status: DC | PRN

## 2021-10-08 MED ORDER — OXYCODONE HCL 10 MG PO TABS: 10.0000 mg | ORAL_TABLET | Freq: Four times a day (QID) | ORAL | 0 refills | Status: DC | PRN

## 2021-10-08 NOTE — Evaluation (Signed)
Physical Therapy Evaluation & Discharge ?Patient Details ?Name: [name] ?MRN: [id number] ?DOB: 04-18-1956 ?Today's Date: 10/08/2021 ? ?History of Present Illness ? 66 y/o female admitted on 10/07/21 following lumbar wound debridement. Recently underwent PLIF on 4/26. PMH: HTN, T2DM, breast cancer s/p mastectomy  ?Clinical Impression ? Patient admitted following above procedure. Patient with good recall of back precautions from recent surgery. Able to maintain during all mobility. Patient functioning at modI level with use of RW. Patient eager to return home. Discussed with patient about potential need for OPPT once cleared from back precautions. No further skilled PT needs identified. No PT follow up recommended at this time.    ?   ? ?Recommendations for follow up therapy are one component of a multi-disciplinary discharge planning process, led by the attending physician.  Recommendations may be updated based on patient status, additional functional criteria and insurance authorization. ? ?Follow Up Recommendations No PT follow up ? ?  ?Assistance Recommended at Discharge Intermittent Supervision/Assistance  ?Patient can return home with the following ?   ? ?  ?Equipment Recommendations None recommended by PT  ?Recommendations for Other Services ?    ?  ?Functional Status Assessment Patient has had a recent decline in their functional status and demonstrates the ability to make significant improvements in function in a reasonable and predictable amount of time.  ? ?  ?Precautions / Restrictions Precautions ?Precautions: Back ?Precaution Booklet Issued: No ?Precaution Comments: pt is able to recall BLT acronym ?Required Braces or Orthoses: Spinal Brace ?Spinal Brace: Lumbar corset ?Restrictions ?Weight Bearing Restrictions: No  ? ?  ? ?Mobility ? Bed Mobility ?Overal bed mobility: Modified Independent ?  ?  ?  ?  ?  ?  ?General bed mobility comments: good recall of log roll ?  ? ?Transfers ?Overall transfer  level: Modified independent ?  ?  ?  ?  ?  ?  ?  ?  ?General transfer comment: increased time to complete ?  ? ?Ambulation/Gait ?Ambulation/Gait assistance: Modified independent (Device/Increase time) ?Gait Distance (Feet): 300 Feet ?Assistive device: Rolling Chelisa Hennen (2 wheels) ?Gait Pattern/deviations: Step-through pattern ?Gait velocity: decreased ?  ?  ?General Gait Details: slow steady gait pattern ? ?Stairs ?  ?  ?  ?  ?  ? ?Wheelchair Mobility ?  ? ?Modified Rankin (Stroke Patients Only) ?  ? ?  ? ?Balance Overall balance assessment: Needs assistance ?Sitting-balance support: No upper extremity supported, Feet supported ?Sitting balance-Leahy Scale: Good ?  ?  ?Standing balance support: Bilateral upper extremity supported ?Standing balance-Leahy Scale: Poor ?  ?  ?  ?  ?  ?  ?  ?  ?  ?  ?  ?  ?   ? ? ? ?Pertinent Vitals/Pain Pain Assessment ?Pain Assessment: Faces ?Faces Pain Scale: Hurts little more ?Pain Location: back ?Pain Descriptors / Indicators: Sore ?Pain Intervention(s): Monitored during session  ? ? ?Home Living Family/patient expects to be discharged to:: Private residence ?Living Arrangements: Alone ?Available Help at Discharge: Family ?Type of Home: House ?Home Access: Level entry ?  ?  ?  ?Home Layout: One level ?Home Equipment: Shower seat - built in;Grab bars - tub/shower;Hand held shower head;Adaptive equipment ?   ?  ?Prior Function Prior Level of Function : Independent/Modified Independent ?  ?  ?  ?  ?  ?  ?Mobility Comments: has been managing at home modI with use of RW for all ADLs and iADLs ?  ?  ? ? ?Hand Dominance  ?  Dominant Hand: Left ? ?  ?Extremity/Trunk Assessment  ?   ?  ? ?Lower Extremity Assessment ?Lower Extremity Assessment: Generalized weakness ?  ? ?Cervical / Trunk Assessment ?Cervical / Trunk Assessment: Back Surgery  ?Communication  ? Communication: No difficulties  ?Cognition Arousal/Alertness: Awake/alert ?Behavior During Therapy: [facility] for tasks  assessed/performed ?Overall Cognitive Status: Within Functional Limits for tasks assessed ?  ?  ?  ?  ?  ?  ?  ?  ?  ?  ?  ?  ?  ?  ?  ?  ?  ?  ?  ? ?  ?General Comments   ? ?  ?Exercises    ? ?Assessment/Plan  ?  ?PT Assessment Patient does not need any further PT services  ?PT Problem List   ? ?   ?  ?PT Treatment Interventions     ? ?PT Goals (Current goals can be found in the Care Plan section)  ?Acute Rehab PT Goals ?Patient Stated Goal: to go home ?PT Goal Formulation: All assessment and education complete, DC therapy ? ?  ?Frequency   ?  ? ? ?Co-evaluation   ?  ?  ?  ?  ? ? ?  ?AM-PAC PT "6 Clicks" Mobility  ?Outcome Measure Help needed turning from your back to your side while in a flat bed without using bedrails?: None ?Help needed moving from lying on your back to sitting on the side of a flat bed without using bedrails?: None ?Help needed moving to and from a bed to a chair (including a wheelchair)?: None ?Help needed standing up from a chair using your arms (e.g., wheelchair or bedside chair)?: None ?Help needed to walk in hospital room?: None ?Help needed climbing 3-5 steps with a railing? : None ?6 Click Score: 24 ? ?  ?End of Session Equipment Utilized During Treatment: Back brace ?Activity Tolerance: Patient tolerated treatment well ?Patient left: in bed;with call bell/phone within reach ?Nurse Communication: Mobility status ?PT Visit Diagnosis: Muscle weakness (generalized) (M62.81) ?  ? ?Time: 9201-0071 ?PT Time Calculation (min) (ACUTE ONLY): 17 min ? ? ?Charges:   PT Evaluation ?$PT Eval Moderate Complexity: 1 Mod ?  ?  ?   ? ? ?Henry Utsey A. Gilford Rile, PT, DPT ?Acute Rehabilitation Services ?Pager [phone] ?Office [phone] ? ? ?Lucus Lambertson A Chesley Veasey ?10/08/2021, 10:06 AM ? ?

## 2021-10-08 NOTE — Discharge Summary (Signed)
Physician Discharge Summary  ?Patient ID: ?[name] ?MRN: [id number] ?DOB/AGE: [birth year] 66 y.o. ? ?Admit date: 10/07/2021 ?Discharge date: 10/08/2021 ? ?Admission Diagnoses: Wound drainage  ? ? ?Discharge Diagnoses: Same ? ? ?Discharged Condition: good ? ?Hospital Course: The patient was admitted on 10/07/2021 and taken to the operating room where the patient underwent lumbar wound revision. The patient tolerated the procedure well and was taken to the recovery room and then to the floor in stable condition. The hospital course was routine. There were no complications. The wound remained clean dry and intact. Pt had appropriate back soreness. No complaints of leg pain or new N/T/W. The patient remained afebrile with stable vital signs, and tolerated a regular diet. The patient continued to increase activities, and pain was well controlled with oral pain medications.  ? ?Consults: None ? ?Significant Diagnostic Studies:  ?Results for orders placed or performed during the hospital encounter of 10/07/21  ?Aerobic/Anaerobic Culture w Gram Stain (surgical/deep wound)  ? Specimen: PATH Other; Tissue  ?Result Value Ref Range  ? Specimen Description TISSUE   ? Special Requests LUMBAR WOUND   ? Gram Stain    ?  NO WBC SEEN ?NO ORGANISMS SEEN ?Performed at [facility], [place] [address], [place],  [address] ?  ? Culture PENDING   ? Report Status PENDING   ?Glucose, capillary  ?Result Value Ref Range  ? Glucose-Capillary 115 (H) 70 - 99 mg/dL  ?Glucose, capillary  ?Result Value Ref Range  ? Glucose-Capillary 91 70 - 99 mg/dL  ?Glucose, capillary  ?Result Value Ref Range  ? Glucose-Capillary 135 (H) 70 - 99 mg/dL  ?CBC  ?Result Value Ref Range  ? WBC 9.9 4.0 - 10.5 K/uL  ? RBC 3.83 (L) 3.87 - 5.11 MIL/uL  ? Hemoglobin 10.7 (L) 12.0 - 15.0 g/dL  ? HCT 34.1 (L) 36.0 - 46.0 %  ? MCV 89.0 80.0 - 100.0 fL  ? MCH 27.9 26.0 - 34.0 pg  ? MCHC 31.4 30.0 - 36.0 g/dL  ? RDW 14.6 11.5 - 15.5 %  ? Platelets 386 150 - 400  K/uL  ? nRBC 0.0 0.0 - 0.2 %  ?Creatinine, serum  ?Result Value Ref Range  ? Creatinine, Ser 1.07 (H) 0.44 - 1.00 mg/dL  ? GFR, Estimated 57 (L) >60 mL/min  ?Glucose, capillary  ?Result Value Ref Range  ? Glucose-Capillary 193 (H) 70 - 99 mg/dL  ? Comment 1 Notify RN   ? Comment 2 Document in Chart   ?Glucose, capillary  ?Result Value Ref Range  ? Glucose-Capillary 125 (H) 70 - 99 mg/dL  ? Comment 1 Notify RN   ? Comment 2 Document in Chart   ? ? ?DG Lumbar Spine 2-3 Views ? ?Result Date: 09/21/2021 ?CLINICAL DATA:  Fluoroscopic assistance for lumbar fusion EXAM: LUMBAR SPINE - 2-3 VIEW COMPARISON:  MR lumbar spine done on 09/11/2021 FINDINGS: Fluoroscopic images show surgical fusion at L3-L4 and L4-L5 levels. Intervertebral disc spaces are noted in place. Fluoroscopic time was 1 minutes and 25 seconds. Estimated radiation dose was 249.58 mGy. IMPRESSION: Fluoroscopic assistance was provided for lumbar spinal fusion. Electronically Signed   By: Elmer Picker M.D.   On: 09/21/2021 20:06  ? ?MR LUMBAR SPINE WO CONTRAST ? ?Result Date: 09/12/2021 ?CLINICAL DATA:  Right low back pain EXAM: MRI LUMBAR SPINE WITHOUT CONTRAST TECHNIQUE: Multiplanar, multisequence MR imaging of the lumbar spine was performed. No intravenous contrast was administered. COMPARISON:  2015 FINDINGS: Segmentation:  Standard. Alignment:  Stable.  No significant listhesis. Vertebrae: Stable vertebral body heights. No substantial marrow edema. No suspicious osseous lesion. Conus medullaris and cauda equina: Conus extends to the L1-L2 level. Conus and cauda equina appear normal. Paraspinal and other soft tissues: Unremarkable. Disc levels: Small disc bulges are present at lower thoracic levels. L1-L2:  Mild facet arthropathy.  No canal or foraminal stenosis. L2-L3: Minimal disc bulge. Moderate facet arthropathy with ligamentum flavum infolding. Mild canal stenosis. No significant foraminal stenosis. L3-L4: Disc bulge. Marked right and mild left  facet arthropathy with ligamentum flavum infolding. Increased moderate to marked canal stenosis. Increased narrowing of the right greater than left subarticular recesses. No significant foraminal stenosis. L4-L5: Disc bulge with superimposed shallow central protrusion. Marked facet arthropathy with ligamentum flavum infolding. Increased mild to moderate canal stenosis. Slight effacement of the subarticular recesses. No significant foraminal stenosis. L5-S1: Marked right and moderate left facet arthropathy with ligamentum flavum infolding. No significant canal or foraminal stenosis. IMPRESSION: Multilevel degenerative changes as detailed above with some progression since 2015. Most notably, there is increased moderate to marked canal stenosis at L3-L4 with narrowing of the right greater than left subarticular recesses. Electronically Signed   By: Macy Mis M.D.   On: 09/12/2021 09:48  ? ?DG C-Arm 1-60 Min-No Report ? ?Result Date: 09/21/2021 ?Fluoroscopy was utilized by the requesting physician.  No radiographic interpretation.  ? ?DG C-Arm 1-60 Min-No Report ? ?Result Date: 09/21/2021 ?Fluoroscopy was utilized by the requesting physician.  No radiographic interpretation.  ? ?DG C-Arm 1-60 Min-No Report ? ?Result Date: 09/21/2021 ?Fluoroscopy was utilized by the requesting physician.  No radiographic interpretation.  ? ?DG C-Arm 1-60 Min-No Report ? ?Result Date: 09/21/2021 ?Fluoroscopy was utilized by the requesting physician.  No radiographic interpretation.  ? ?DG C-Arm 1-60 Min-No Report ? ?Result Date: 09/21/2021 ?Fluoroscopy was utilized by the requesting physician.  No radiographic interpretation.  ? ?DG C-Arm 1-60 Min-No Report ? ?Result Date: 09/21/2021 ?Fluoroscopy was utilized by the requesting physician.  No radiographic interpretation.   ? ?Antibiotics:  ?Anti-infectives (From admission, onward)  ? ? Start     Dose/Rate Route Frequency Ordered Stop  ? 10/08/21 0330  vancomycin (VANCOREADY) IVPB 1250  mg/250 mL       ? 1,250 mg ?166.7 mL/hr over 90 Minutes Intravenous  Once 10/07/21 2301 10/08/21 0509  ? 10/07/21 1515  vancomycin (VANCOCIN) IVPB 1000 mg/200 mL premix       ? 1,000 mg ?200 mL/hr over 60 Minutes Intravenous On call to O.R. 10/07/21 1501 10/07/21 1545  ? 10/07/21 1502  vancomycin (VANCOCIN) 1-5 GM/200ML-% IVPB       ?Note to Pharmacy: Dorthea Cove: cabinet override  ?    10/07/21 1502 10/07/21 1553  ? ?  ? ? ?Discharge Exam: ?Blood pressure 132/64, pulse 80, temperature (!) 97.5 ?F (36.4 ?C), temperature source Oral, resp. rate 20, height '5\' 6"'$  (1.676 m), weight 117.9 kg, SpO2 98 %. ?Neurologic: Grossly normal ?Dressing dry ? ?Discharge Medications:   ?Allergies as of 10/08/2021   ? ?   Reactions  ? Allopurinol Hives  ? Mucinex [guaifenesin Er] Shortness Of Breath  ? Penicillins Hives, Other (See Comments)  ? Has patient had a PCN reaction causing immediate rash, facial/tongue/throat swelling, SOB or lightheadedness with hypotension: no ?Has patient had a PCN reaction causing severe rash involving mucus membranes or skin necrosis: no ?Has patient had a PCN reaction that required hospitalization no ?Has patient had a PCN reaction occurring within the last 10 years:  no ?If all of the above answers are "NO", then may proceed with Cephalosporin use.  ? Prozac [fluoxetine Hcl] Hives  ? Amitriptyline Other (See Comments)  ? "felt weird, fatigue, dizziness"  ? Lexapro [escitalopram Oxalate]   ? Nausea and hypersalivation.   ? ?  ? ?  ?Medication List  ?  ? ?TAKE these medications   ? ?acetaminophen 650 MG CR tablet ?Commonly known as: TYLENOL ?Take 1,300 mg by mouth every 8 (eight) hours as needed for pain. ?  ?albuterol 108 (90 Base) MCG/ACT inhaler ?Commonly known as: VENTOLIN HFA ?Inhale 2 puffs into the lungs every 6 (six) hours as needed. ?What changed: reasons to take this ?  ?buPROPion 150 MG 12 hr tablet ?Commonly known as: WELLBUTRIN SR ?TAKE 1 TABLET BY MOUTH DAILY ?  ?calcium carbonate 1500  (600 Ca) MG Tabs tablet ?Commonly known as: OSCAL ?Take 1,500 mg by mouth 2 (two) times daily with a meal. ?  ?cholecalciferol 25 MCG (1000 UNIT) tablet ?Commonly known as: VITAMIN D3 ?Take 1,000 Units by

## 2021-10-08 NOTE — Progress Notes (Signed)
Patient alert and oriented, voiding adequately, skin clean, dry and intact without evidence of skin break down, or symptoms of complications - no redness or edema noted, only slight tenderness at site.  Patient states pain is manageable at time of discharge. Patient has an appointment with MD in 3 weeks 

## 2021-10-12 LAB — AEROBIC/ANAEROBIC CULTURE W GRAM STAIN (SURGICAL/DEEP WOUND): Gram Stain: NONE SEEN

## 2021-10-18 ENCOUNTER — Encounter: Payer: Self-pay | Admitting: Internal Medicine

## 2021-10-18 NOTE — Progress Notes (Signed)
[name], [name] ([id number]) Visit Report for 10/19/2021 Allergy List Details Patient Name: Date of Service: [name], [name] 10/19/2021 8:00 A M Medical Record Number: [id number] Patient Account Number: [id number] Date of Birth/Sex: Treating RN: 1956-03-30 (66 y.o. Sue [facility] Kiva Norland: Other Clinician: Referring Tanya Crothers: Treating Adalei Novell/Extender: Sharalyn Ink in Treatment: 0 Allergies Active Allergies allopurinol Reaction: hives Mucinex Reaction: Shortness of Breath penicillin Allergy Notes Electronic Signature(s) Signed: 10/18/2021 2:12:44 PM By: Lorrin Jackson Entered By: Lorrin Jackson on 10/18/2021 14:12:43

## 2021-10-19 ENCOUNTER — Encounter (HOSPITAL_BASED_OUTPATIENT_CLINIC_OR_DEPARTMENT_OTHER): Payer: Medicare Other | Attending: Internal Medicine | Admitting: Internal Medicine

## 2021-10-19 DIAGNOSIS — E11622 Type 2 diabetes mellitus with other skin ulcer: Secondary | ICD-10-CM | POA: Insufficient documentation

## 2021-10-19 DIAGNOSIS — L98422 Non-pressure chronic ulcer of back with fat layer exposed: Secondary | ICD-10-CM | POA: Diagnosis not present

## 2021-10-19 DIAGNOSIS — X58XXXA Exposure to other specified factors, initial encounter: Secondary | ICD-10-CM | POA: Insufficient documentation

## 2021-10-19 DIAGNOSIS — I10 Essential (primary) hypertension: Secondary | ICD-10-CM | POA: Diagnosis not present

## 2021-10-19 DIAGNOSIS — T8131XA Disruption of external operation (surgical) wound, not elsewhere classified, initial encounter: Secondary | ICD-10-CM | POA: Diagnosis not present

## 2021-10-19 NOTE — Progress Notes (Signed)
KHAILEE, MICK ([id number]) Visit Report for 10/19/2021 Chief Complaint Document Details Patient Name: Date of Service: [name], [name] 10/19/2021 8:00 A M Medical Record Number: [id number] Patient Account Number: [id number] Date of Birth/Sex: Treating RN: 04/16/1956 (66 y.o. Sue Lush Primary Care Provider: Penni Homans Other Clinician: Referring Provider: Treating Provider/Extender: Clemmie Krill Weeks in Treatment: 0 Information Obtained from: Patient Chief Complaint Back surgical wound dehiscence Electronic Signature(s) Signed: 10/19/2021 9:04:33 AM By: Worthy Keeler PA-C Entered By: Worthy Keeler on 10/19/2021 09:04:33 -------------------------------------------------------------------------------- Debridement Details Patient Name: Date of Service: [name], Holly K. 10/19/2021 8:00 A M Medical Record Number: [id number] Patient Account Number: [id number] Date of Birth/Sex: Treating RN: [birth year] (66 y.o. Sue Lush Primary Care Provider: Penni Homans Other Clinician: Referring Provider: Treating Provider/Extender: Clemmie Krill Weeks in Treatment: 0 Debridement Performed for Assessment: Wound #1 Back Performed By: Physician Worthy Keeler, PA Debridement Type: Chemical/Enzymatic/Mechanical Agent Used: gauze and wound cleanser Level of Consciousness (Pre-procedure): Awake and Alert Pre-procedure Verification/Time Out Yes - 09:09 Taken: Start Time: 09:10 Pain Control: Other : Benzocaine Bleeding: Minimum Hemostasis Achieved: Pressure End Time: 09:15 Response to Treatment: Procedure was tolerated well Level of Consciousness (Post- Awake and Alert procedure): Post Debridement Measurements of Total Wound Length: (cm) 8 Width: (cm) 1.2 Depth: (cm) 0.7 Volume: (cm) 5.278 Character of Wound/Ulcer Post Debridement: Stable Post Procedure Diagnosis Same as Pre-procedure Electronic Signature(s) Signed: 10/19/2021  3:58:49 PM By: Lorrin Jackson Signed: 10/19/2021 5:33:59 PM By: Worthy Keeler PA-C Entered By: Lorrin Jackson on 10/19/2021 09:21:35 -------------------------------------------------------------------------------- HPI Details Patient Name: Date of Service: MO Jenetta Downer [name], Holly K. 10/19/2021 8:00 A M Medical Record Number: [id number] Patient Account Number: [id number] Date of Birth/Sex: Treating RN: Feb 10, 1956 (66 y.o. Sue Lush Primary Care Provider: Penni Homans Other Clinician: Referring Provider: Treating Provider/Extender: Dyanne Carrel in Treatment: 0 History of Present Illness HPI Description: 10-19-2021 upon evaluation today patient appears to be doing poorly currently in regard to a wound over her back. She had an original surgery which actually was done in April 2023 this was on the 26th. Subsequently she had some issues following and this was leaking and draining she had to go back for repeat reopening of the wound in order to clean this out and subsequently the patient has not really noted significant improvement since that time. She still having a lot of pain and issues during the procedure they did do a culture from the deep portion of the wound when they cleaned this out and it showed Pseudomonas this was not treated however according to the patient. It was told to her that it was not likely representative of infection. Nonetheless she has continued to have drainage and pain. She does have some dehiscence of the wound yet again there are sutures noted in the base of the wound and I do believe that this is causing some issue with healing to be honest we can have to clean this out and in turn we can have to loosen the stitches in order to allow for appropriate healing from the bottom out. I discussed that with her and actually removed 8 absorbable sutures which were present in the base of the wound. This allowed the wound to open a little bit more and  subsequently I do believe that this is going to allow for more appropriate dressing changes in general. The patient did have some discomfort with this but overall  I think it was unnecessary action in order to get this to heal appropriately. She definitely voiced understanding in that regard. Otherwise the good news is there are no real deep tunnels or undermining areas which is great news. Patient does have a history of diabetes mellitus type 2, hypertension, but really no other major medical problems. The good news is as far as the reason she went in for surgery which was her back pain and issues there everything seems to be doing much better. Electronic Signature(s) Signed: 10/19/2021 5:31:25 PM By: Worthy Keeler PA-C Previous Signature: 10/19/2021 5:31:06 PM Version By: Worthy Keeler PA-C Entered By: Worthy Keeler on 10/19/2021 17:31:25 -------------------------------------------------------------------------------- Physical Exam Details Patient Name: Date of Service: MO Jenetta Downer [name], [name] 10/19/2021 8:00 A M Medical Record Number: [id number] Patient Account Number: [id number] Date of Birth/Sex: Treating RN: Feb 14, 1956 (66 y.o. Sue Lush Primary Care Provider: Penni Homans Other Clinician: Referring Provider: Treating Provider/Extender: Clemmie Krill Weeks in Treatment: 0 Constitutional sitting or standing blood pressure is within target range for patient.. pulse regular and within target range for patient.Marland Kitchen respirations regular, non-labored and within target range for patient.Marland Kitchen temperature within target range for patient.. Well-nourished and well-hydrated in no acute distress. Eyes conjunctiva clear no eyelid edema noted. pupils equal round and reactive to light and accommodation. Ears, Nose, Mouth, and Throat no gross abnormality of ear auricles or external auditory canals. normal hearing noted during conversation. mucus membranes moist. Respiratory normal  breathing without difficulty. Cardiovascular 2+ dorsalis pedis/posterior tibialis pulses. no clubbing, cyanosis, significant edema, <3 sec cap refill. Musculoskeletal normal gait and posture. no significant deformity or arthritic changes, no loss or range of motion, no clubbing. Psychiatric this patient is able to make decisions and demonstrates good insight into disease process. Alert and Oriented x 3. pleasant and cooperative. Notes Upon inspection patient's wound again was being held together by sutures that were absorbing but not completely released at this point. Subsequently I think this may be causing some inflammation and to be perfectly honest it is going to be impossible to treat her wound without removing these therefore I did remove 8 of these and will resolve sutures which allowed [name] to get to the wound I explained to the patient that this is going to mean that the wound will be a little bit more open as far as the width is concerned but will also mean that we can actually start working on getting it to healing from the bottom out more appropriately. She was in agreement with that plan. Once I removed this and indeed was a little bit wider and we were able to get to the base of the wound I did not perform any debridement at this point as she is having a lot of discomfort I do want to probably see about putting her on some antibiotics to see if we can help in that regard. That may be part of the reason this is hurting so badly. Electronic Signature(s) Signed: 10/19/2021 5:32:24 PM By: Worthy Keeler PA-C Entered By: Worthy Keeler on 10/19/2021 17:32:24 -------------------------------------------------------------------------------- Physician Orders Details Patient Name: Date of Service: [name], Holly K. 10/19/2021 8:00 A M Medical Record Number: [id number] Patient Account Number: [id number] Date of Birth/Sex: Treating RN: Oct 02, 1955 (66 y.o. 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 - 10/26/21 @ 10:15 am with Orland Jarred, Room 7 Return A Other: - Prescription for Levaquin sent to pharmacy, take as directed. Bathing/ Shower/ Hygiene Do not shower or bathe in tub. Additional Orders / Instructions Follow Nutritious Diet - Increase protein intake Wound Treatment Wound #1 - Back Cleanser: Normal Saline (DME) (Generic) 3 x Per Week/30 Days Discharge Instructions: Cleanse the wound with Normal Saline prior to applying a clean dressing using gauze sponges, not tissue or cotton balls. Cleanser: Soap and Water 3 x Per Week/30 Days Discharge Instructions: May shower and wash wound with dial antibacterial soap and water prior to dressing change. Prim Dressing: IODOFLEX 0.9% Cadexomer Iodine Pad 4x6 cm (DME) (Generic) 3 x Per Week/30 Days ary Discharge Instructions: Apply to wound bed as instructed Secondary Dressing: ABD Pad, 5x9 (DME) (Generic) 3 x Per Week/30 Days Discharge Instructions: Apply over primary dressing as directed. Secondary Dressing: Woven Gauze Sponge, Non-Sterile 4x4 in (DME) (Generic) 3 x Per Week/30 Days Discharge Instructions: Apply over primary dressing as directed. Secured With: 44M Medipore H Soft Cloth Surgical T ape, 4 x 10 (in/yd) (DME) (Generic) 3 x Per Week/30 Days Discharge Instructions: Secure with tape as directed. Patient Medications llergies: allopurinol, Mucinex, penicillin A Notifications Medication Indication Start  End 10/19/2021 Levaquin DOSE 1 - oral 500 mg tablet - 1 tablet oral taken 1 time per day for 14 days. Do not take calcium or zanaflex with this medication Electronic Signature(s) Signed: 10/19/2021 10:48:07 AM By: Worthy Keeler PA-C Entered By: Worthy Keeler on 10/19/2021 10:48:06 -------------------------------------------------------------------------------- Problem List Details Patient Name: Date of Service: [name], Holly BRAXTON. 10/19/2021 8:00 A M Medical Record Number: [id number] Patient Account Number: [id number] Date of Birth/Sex: Treating RN: 06-19-1955 (67 y.o. 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 10/19/2021 8:00 A M Medical Record Number: [id number] Patient Account Number: [id number] Date of Birth/Sex: Treating RN: 07/06/1955 (66 y.o. Sue Lush Primary Care Provider: Penni Homans Other Clinician: Referring Provider: Treating Provider/Extender: Clemmie Krill Weeks in Treatment: 0 Subjective Chief Complaint Information obtained from Patient Back surgical wound dehiscence History of Present  Illness (HPI) 10-19-2021 upon evaluation today patient appears to be doing poorly currently in regard to a wound over her back. She had an original surgery which actually was done in April 2023 this was on the 26th. Subsequently she had some issues following and this was leaking and draining she had to go back for repeat reopening of the wound in order to clean this out and subsequently the patient has not really noted significant improvement since that time. She  still having a lot of pain and issues during the procedure they did do a culture from the deep portion of the wound when they cleaned this out and it showed Pseudomonas this was not treated however according to the patient. It was told to her that it was not likely representative of infection. Nonetheless she has continued to have drainage and pain. She does have some dehiscence of the wound yet again there are sutures noted in the base of the wound and I do believe that this is causing some issue with healing to be honest we can have to clean this out and in turn we can have to loosen the stitches in order to allow for appropriate healing from the bottom out. I discussed that with her and actually removed 8 absorbable sutures which were present in the base of the wound. This allowed the wound to open a little bit more and subsequently I do believe that this is going to allow for more appropriate dressing changes in general. The patient did have some discomfort with this but overall I think it was unnecessary action in order to get this to heal appropriately. She definitely voiced understanding in that regard. Otherwise the good news is there are no real deep tunnels or undermining areas which is great news. Patient does have a history of diabetes mellitus type 2, hypertension, but really no other major medical problems. The good news is as far as the reason she went in for surgery which was her back pain and issues there everything seems to be  doing much better. Patient History Information obtained from Patient, Chart. Allergies allopurinol (Reaction: hives), Mucinex (Reaction: Shortness of Breath), penicillin Family History Cancer - Father,Siblings,Maternal Grandparents, Diabetes - Father, Heart Disease - Paternal Grandparents, Hypertension - Paternal Grandparents, Lung Disease - Father, Stroke - Maternal Grandparents, No family history of Hereditary Spherocytosis, Kidney Disease, Seizures, Thyroid Problems, Tuberculosis. Social History Never smoker, Marital Status - Widowed, Alcohol Use - Never, Drug Use - No History, Caffeine Use - Daily. Medical History Ear/Nose/Mouth/Throat Patient has history of Chronic sinus problems/congestion Hematologic/Lymphatic Patient has history of Anemia Respiratory Patient has history of Asthma Cardiovascular Patient has history of Arrhythmia, Hypertension Endocrine Patient has history of Type II Diabetes Musculoskeletal Patient has history of Gout, Rheumatoid Arthritis Neurologic Patient has history of Neuropathy Oncologic Patient has history of Received Chemotherapy Patient is treated with Oral Agents. Blood sugar is not tested. Medical A Surgical History Notes nd Neurologic Fibromyalgia Oncologic Right Breast Mastectomy Review of Systems (ROS) Eyes Complains or has symptoms of Glasses / Contacts. Ear/Nose/Mouth/Throat Complains or has symptoms of Chronic sinus problems or rhinitis. Genitourinary Denies complaints or symptoms of Frequent urination. Integumentary (Skin) Complains or has symptoms of Wounds. Psychiatric Denies complaints or symptoms of Claustrophobia, Suicidal. Objective Constitutional sitting or standing blood pressure is within target range for patient.. pulse regular and within target range for patient.Marland Kitchen respirations regular, non-labored and within target range for patient.Marland Kitchen temperature within target range for patient.. Well-nourished and well-hydrated in  no acute distress. Vitals Time Taken: 8:10 AM, Height: 66 in, Source: Stated, Weight: 255 lbs, Source: Stated, BMI: 41.2, Temperature: 98.1 F, Pulse: 84 bpm, Respiratory Rate: 18 breaths/min, Blood Pressure: 114/79 mmHg. Eyes conjunctiva clear no eyelid edema noted. pupils equal round and reactive to light and accommodation. Ears, Nose, Mouth, and Throat no gross abnormality of ear auricles or external auditory canals. normal hearing noted during conversation. mucus membranes moist. Respiratory normal breathing without difficulty. Cardiovascular 2+ dorsalis pedis/posterior tibialis  pulses. no clubbing, cyanosis, significant edema, Musculoskeletal normal gait and posture. no significant deformity or arthritic changes, no loss or range of motion, no clubbing. Psychiatric this patient is able to make decisions and demonstrates good insight into disease process. Alert and Oriented x 3. pleasant and cooperative. General Notes: Upon inspection patient's wound again was being held together by sutures that were absorbing but not completely released at this point. Subsequently I think this may be causing some inflammation and to be perfectly honest it is going to be impossible to treat her wound without removing these therefore I did remove 8 of these and will resolve sutures which allowed [name] to get to the wound I explained to the patient that this is going to mean that the wound will be a little bit more open as far as the width is concerned but will also mean that we can actually start working on getting it to healing from the bottom out more appropriately. She was in agreement with that plan. Once I removed this and indeed was a little bit wider and we were able to get to the base of the wound I did not perform any debridement at this point as she is having a lot of discomfort I do want to probably see about putting her on some antibiotics to see if we can help in that regard. That may be part of  the reason this is hurting so badly. Integumentary (Hair, Skin) Wound #1 status is Open. Original cause of wound was Surgical Injury. The date acquired was: 09/21/2021. The wound is located on the Back. The wound measures 8cm length x 1.2cm width x 0.7cm depth; 7.54cm^2 area and 5.278cm^3 volume. There is Fat Layer (Subcutaneous Tissue) exposed. There is no undermining noted, however, there is tunneling at 12:00 with a maximum distance of 1.2cm. There is a medium amount of serosanguineous drainage noted. The wound margin is well defined and not attached to the wound base. There is medium (34-66%) red, pink granulation within the wound bed. There is a medium (34- 66%) amount of necrotic tissue within the wound bed. 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 There was a Chemical/Enzymatic/Mechanical debridement performed by Worthy Keeler, PA. to remove Non-Viable tissue/material. Material removed includes Skin: Dermis after achieving pain control using Other (Benzocaine). Other agent used was gauze and wound cleanser. A time out was conducted at 09:09, prior to the start of the procedure. A Minimum amount of bleeding was controlled with Pressure. The procedure was tolerated well. Post Debridement Measurements: 8cm length x 1.2cm width x 0.7cm depth; 5.278cm^3 volume. Character of Wound/Ulcer Post Debridement is stable. Post procedure Diagnosis Wound #1: Same as Pre-Procedure Plan Follow-up Appointments: Return Appointment in 1 week. - 10/26/21 @ 10:15 am with Orland Jarred, Room 7 Other: - Prescription for Levaquin sent to pharmacy, take as directed. Bathing/ Shower/  Hygiene: Do not shower or bathe in tub. Additional Orders / Instructions: Follow Nutritious Diet - Increase protein intake The following medication(s) was prescribed: Levaquin oral 500 mg tablet 1 1 tablet oral taken 1 time per day for 14 days. Do not take calcium or zanaflex with this medication starting 10/19/2021 WOUND #1: - Back Wound Laterality: Cleanser: Normal Saline (DME) (Generic)  3 x Per Week/30 Days Discharge Instructions: Cleanse the wound with Normal Saline prior to applying a clean dressing using gauze sponges, not tissue or cotton balls. Cleanser: Soap and Water 3 x Per Week/30 Days Discharge Instructions: May shower and wash wound with dial antibacterial soap and water prior to dressing change. Prim Dressing: IODOFLEX 0.9% Cadexomer Iodine Pad 4x6 cm (DME) (Generic) 3 x Per Week/30 Days ary Discharge Instructions: Apply to wound bed as instructed Secondary Dressing: ABD Pad, 5x9 (DME) (Generic) 3 x Per Week/30 Days Discharge Instructions: Apply over primary dressing as directed. Secondary Dressing: Woven Gauze Sponge, Non-Sterile 4x4 in (DME) (Generic) 3 x Per Week/30 Days Discharge Instructions: Apply over primary dressing as directed. Secured With: 10M Medipore H Soft Cloth Surgical T ape, 4 x 10 (in/yd) (DME) (Generic) 3 x Per Week/30 Days Discharge Instructions: Secure with tape as directed. 1. I am going to go ahead and send in a prescription for the patient today for Levaquin I think this is going to be a very good option for her. 2. I am also can recommend that we have the patient continue to monitor for any signs of infection worsening such as increased pain if anything changes she should let me know. 3. We will initiate treatment with Iodosorb/Iodoflex in order to try to clean out the base of the wound I think that is going to be of utmost concern and the patient was in agreement with that plan. We will see how things appear next week. We will see patient back for  reevaluation in 1 week here in the clinic. If anything worsens or changes patient will contact our office for additional recommendations. Electronic Signature(s) Signed: 10/19/2021 5:33:22 PM By: Worthy Keeler PA-C Entered By: Worthy Keeler on 10/19/2021 17:33:22 -------------------------------------------------------------------------------- HxROS Details Patient Name: Date of Service: [name], Holly K. 10/19/2021 8:00 A M Medical Record Number: [id number] Patient Account Number: [id number] Date of Birth/Sex: Treating RN: [birth year] (66 y.o. Sue Lush Primary Care Provider: Penni Homans Other Clinician: Referring Provider: Treating Provider/Extender: Clemmie Krill Weeks in Treatment: 0 Information Obtained From Patient Chart Eyes Complaints and Symptoms: Positive for: Glasses / Contacts Ear/Nose/Mouth/Throat Complaints and Symptoms: Positive for: Chronic sinus problems or rhinitis Medical History: Positive for: Chronic sinus problems/congestion Genitourinary Complaints and Symptoms: Negative for: Frequent urination Integumentary (Skin) Complaints and Symptoms: Positive for: Wounds Psychiatric Complaints and Symptoms: Negative for: Claustrophobia; Suicidal Hematologic/Lymphatic Medical History: Positive for: Anemia Respiratory Medical History: Positive for: Asthma Cardiovascular Medical History: Positive for: Arrhythmia; Hypertension Endocrine Medical History: Positive for: Type II Diabetes Time with diabetes: 2 years Treated with: Oral agents Blood sugar tested every day: No Immunological Musculoskeletal Medical History: Positive for: Gout; Rheumatoid Arthritis Neurologic Medical History: Positive for: Neuropathy Past Medical History Notes: Fibromyalgia Oncologic Medical History: Positive for: Received Chemotherapy Past Medical History Notes: Right Breast Mastectomy HBO Extended History Items Ear/Nose/Mouth/Throat: Chronic  sinus problems/congestion Immunizations Pneumococcal Vaccine: Received Pneumococcal Vaccination: Yes Received Pneumococcal Vaccination On or After 60th Birthday: No Implantable Devices None Family and Social History Cancer: Yes - Father,Siblings,Maternal Grandparents; Diabetes: Yes - Father; Heart Disease: Yes - Paternal Grandparents; Hereditary Spherocytosis: No; Hypertension: Yes - Paternal Grandparents; Kidney Disease: No; Lung Disease: Yes - Father; Seizures: No; Stroke: Yes - Maternal Grandparents; Thyroid Problems: No; Tuberculosis: No; Never smoker; Marital Status - Widowed; Alcohol Use: Never; Drug Use: No History; Caffeine Use: Daily; Financial Concerns: No; Food, Clothing or Shelter Needs: No; Support System Lacking: No; Transportation Concerns: No  Electronic Signature(s) Signed: 10/19/2021 3:58:49 PM By: Lorrin Jackson Signed: 10/19/2021 5:33:59 PM By: Worthy Keeler PA-C Entered By: Lorrin Jackson on 10/19/2021 08:16:49 -------------------------------------------------------------------------------- SuperBill Details Patient Name: Date of Service: [name] 10/19/2021 Medical Record Number: [id number] Patient Account Number: [id number] Date of Birth/Sex: Treating RN: 1955/06/04 (66 y.o. 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 E11.622 Type 2 diabetes mellitus with other skin ulcer I10 Essential (primary) hypertension Facility Procedures CPT4 Code: 00712197 [id number] Description: 9213 - WOUND CARE VISIT-LEV 3 EST PT Modifier: Quantity: 1 Physician Procedures : CPT4 Code Description Modifier [id number] - WC PHYS LEVEL 4  - NEW PT ICD-10 Diagnosis Description T81.31XA Disruption of external operation (surgical) wound, not elsewhere classified, initial encounter L98.422 Non-pressure chronic ulcer of back with  fat layer exposed E11.622 Type 2 diabetes mellitus with other skin ulcer I10 Essential (primary) hypertension Quantity: 1 Electronic Signature(s) Signed: 10/19/2021 5:33:46 PM By: Worthy Keeler PA-C Previous Signature: 10/19/2021 3:58:49 PM Version By: Lorrin Jackson Entered By: Worthy Keeler on 10/19/2021 17:33:45

## 2021-10-19 NOTE — Progress Notes (Signed)
[name] ([id number]) Visit Report for 10/19/2021 Abuse Risk Screen Details Patient Name: Date of Service: [name], [name] 10/19/2021 8:00 A M Medical Record Number: [id number] Patient Account Number: [id number] Date of Birth/Sex: Treating RN: 1955-07-21 (66 y.o. Sue Lush Primary Care Lenise Jr: Penni Homans Other Clinician: Referring Darlena Koval: Treating Sonam Huelsmann/Extender: Clemmie Krill Weeks in Treatment: 0 Abuse Risk Screen Items Answer ABUSE RISK SCREEN: Has anyone close to you tried to hurt or harm you recentlyo No Do you feel uncomfortable with anyone in your familyo No Has anyone forced you do things that you didnt want to doo No Electronic Signature(s) Signed: 10/19/2021 3:58:49 PM By: Lorrin Jackson Entered By: Lorrin Jackson on 10/19/2021 08:17:02 -------------------------------------------------------------------------------- Activities of Daily Living Details Patient Name: Date of Service: [name] 10/19/2021 8:00 A M Medical Record Number: [id number] Patient Account Number: [id number] Date of Birth/Sex: Treating RN: 23-Feb-1956 (66 y.o. Sue Lush Primary Care Cassady Turano: Penni Homans Other Clinician: Referring Bailea Beed: Treating Huzaifa Viney/Extender: Clemmie Krill Weeks in Treatment: 0 Activities of Daily Living Items Answer Activities of Daily Living (Please select one for each item) Drive Automobile Need Assistance T Medications ake Completely Able Use T elephone Completely Able Care for Appearance Completely Able Use T oilet Completely Able Bath / Shower Completely Able Dress Self Completely Able Feed Self Completely Able Walk Completely Able Get In / Out Bed Completely Able Housework Need Assistance Prepare Meals Need Assistance Handle Money Completely Able Shop for Self Need Assistance Electronic Signature(s) Signed: 10/19/2021 3:58:49 PM By: Lorrin Jackson Entered By: Lorrin Jackson on  10/19/2021 08:17:41 -------------------------------------------------------------------------------- Education Screening Details Patient Name: Date of Service: [name], Holly Peter K. 10/19/2021 8:00 A M Medical Record Number: [id number] Patient Account Number: [id number] Date of Birth/Sex: Treating RN: 01/10/1956 (66 y.o. Sue Lush Primary Care Mahlia Fernando: Penni Homans Other Clinician: Referring Jarion Hawthorne: Treating Itzy Adler/Extender: Dyanne Carrel in Treatment: 0 Primary Learner Assessed: Patient Learning Preferences/Education Level/Primary Language Learning Preference: Explanation Highest Education Level: High School Preferred Language: English Cognitive Barrier Language Barrier: No Translator Needed: No Memory Deficit: No Emotional Barrier: No Cultural/Religious Beliefs Affecting Medical Care: No Physical Barrier Impaired Vision: Yes Glasses, Contacts Impaired Hearing: No Decreased Hand dexterity: No Knowledge/Comprehension Knowledge Level: High Comprehension Level: High Ability to understand written instructions: High Ability to understand verbal instructions: High Motivation Anxiety Level: Calm Cooperation: Cooperative Education Importance: Acknowledges Need Interest in Health Problems: Asks Questions Perception: Coherent Willingness to Engage in Self-Management High Activities: Readiness to Engage in Self-Management High Activities: Electronic Signature(s) Signed: 10/19/2021 3:58:49 PM By: Lorrin Jackson Entered By: Lorrin Jackson on 10/19/2021 08:18:10 -------------------------------------------------------------------------------- Fall Risk Assessment Details Patient Name: Date of Service: [name], Holly Peter K. 10/19/2021 8:00 A M Medical Record Number: [id number] Patient Account Number: [id number] Date of Birth/Sex: Treating RN: [birth year] (66 y.o. Sue Lush Primary Care [place] Mullett: Penni Homans Other Clinician: Referring  Elaura Calix: Treating Abbott Jasinski/Extender: Clemmie Krill Weeks in Treatment: 0 Fall Risk Assessment Items Have you had 2 or more falls in the last 12 monthso 0 No Have you had any fall that resulted in injury in the last 12 monthso 0 No FALLS RISK SCREEN History of falling - immediate or within 3 months 0 No Secondary diagnosis (Do you have 2 or more medical diagnoseso) 15 Yes Ambulatory aid None/bed rest/wheelchair/nurse 0 Yes Crutches/cane/walker 0 No Furniture 0 No Intravenous therapy Access/Saline/Heparin Lock 0 No Gait/Transferring Normal/ bed rest/ wheelchair 0  Yes Weak (short steps with or without shuffle, stooped but able to lift head while walking, may seek 0 No support from furniture) Impaired (short steps with shuffle, may have difficulty arising from chair, head down, impaired 0 No balance) Mental Status Oriented to own ability 0 Yes Electronic Signature(s) Signed: 10/19/2021 3:58:49 PM By: Lorrin Jackson Entered By: Lorrin Jackson on 10/19/2021 08:18:26 -------------------------------------------------------------------------------- Foot Assessment Details Patient Name: Date of Service: [name], Holly Peter K. 10/19/2021 8:00 A M Medical Record Number: [id number] Patient Account Number: [id number] Date of Birth/Sex: Treating RN: 03/30/1956 (66 y.o. Sue Lush Primary Care Durell Lofaso: Penni Homans Other Clinician: Referring Jeannelle Wiens: Treating Laretha Luepke/Extender: Clemmie Krill Weeks in Treatment: 0 Foot Assessment Items Site Locations + = Sensation present, - = Sensation absent, C = Callus, U = Ulcer R = Redness, W = Warmth, M = Maceration, PU = Pre-ulcerative lesion F = Fissure, S = Swelling, D = Dryness Assessment Right: Left: Other Deformity: No No Prior Foot Ulcer: No No Prior Amputation: No No Charcot Joint: No No Ambulatory Status: Gait: Notes N/A: Back Wound Electronic Signature(s) Signed: 10/19/2021 3:58:49 PM By:  Lorrin Jackson Entered By: Lorrin Jackson on 10/19/2021 08:18:54 -------------------------------------------------------------------------------- Nutrition Risk Screening Details Patient Name: Date of Service: [name], [name] 10/19/2021 8:00 A M Medical Record Number: [id number] Patient Account Number: [id number] Date of Birth/Sex: Treating RN: 1955/09/07 (66 y.o. Sue Lush Primary Care Laquetta Racey: Penni Homans Other Clinician: Referring Robyne Matar: Treating Keryl Gholson/Extender: Clemmie Krill Weeks in Treatment: 0 Height (in): 66 Weight (lbs): 255 Body Mass Index (BMI): 41.2 Nutrition Risk Screening Items Score Screening NUTRITION RISK SCREEN: I have an illness or condition that made me change the kind and/or amount of food I eat 0 No I eat fewer than two meals per day 0 No I eat few fruits and vegetables, or milk products 0 No I have three or more drinks of beer, liquor or wine almost every day 0 No I have tooth or mouth problems that make it hard for me to eat 0 No I don't always have enough money to buy the food I need 0 No I eat alone most of the time 0 No I take three or more different prescribed or over-the-counter drugs a day 1 Yes Without wanting to, I have lost or gained 10 pounds in the last six months 0 No I am not always physically able to shop, cook and/or feed myself 0 No Nutrition Protocols Good Risk Protocol 0 No interventions needed Moderate Risk Protocol High Risk Proctocol Risk Level: Good Risk Score: 1 Electronic Signature(s) Signed: 10/19/2021 3:58:49 PM By: Lorrin Jackson Entered By: Lorrin Jackson on 10/19/2021 08:18:36

## 2021-10-20 DIAGNOSIS — E11622 Type 2 diabetes mellitus with other skin ulcer: Secondary | ICD-10-CM | POA: Diagnosis not present

## 2021-10-24 DIAGNOSIS — F32A Depression, unspecified: Secondary | ICD-10-CM | POA: Diagnosis not present

## 2021-10-24 DIAGNOSIS — G4733 Obstructive sleep apnea (adult) (pediatric): Secondary | ICD-10-CM | POA: Diagnosis not present

## 2021-10-24 DIAGNOSIS — I10 Essential (primary) hypertension: Secondary | ICD-10-CM | POA: Diagnosis not present

## 2021-10-26 ENCOUNTER — Encounter (HOSPITAL_BASED_OUTPATIENT_CLINIC_OR_DEPARTMENT_OTHER): Payer: Medicare Other | Admitting: Physician Assistant

## 2021-10-26 DIAGNOSIS — T8131XA Disruption of external operation (surgical) wound, not elsewhere classified, initial encounter: Secondary | ICD-10-CM | POA: Diagnosis not present

## 2021-10-26 DIAGNOSIS — L98422 Non-pressure chronic ulcer of back with fat layer exposed: Secondary | ICD-10-CM | POA: Diagnosis not present

## 2021-10-26 DIAGNOSIS — I10 Essential (primary) hypertension: Secondary | ICD-10-CM | POA: Diagnosis not present

## 2021-10-26 DIAGNOSIS — E11622 Type 2 diabetes mellitus with other skin ulcer: Secondary | ICD-10-CM | POA: Diagnosis not present

## 2021-10-26 NOTE — Progress Notes (Signed)
[name], [name] ([id number]) Visit Report for 10/26/2021 Arrival Information Details Patient Name: Date of Service: MO [name], Holly 10/26/2021 10:15 A M Medical Record Number: [id number] Patient Account Number: [id number] Date of Birth/Sex: Treating RN: Apr 05, 1956 (66 y.o. [name] Primary Care Avenir Lozinski: Penni Homans Other Clinician: Referring Leilany Digeronimo: Treating Masen Luallen/Extender: Dyanne Carrel in Treatment: 1 Visit Information History Since Last Visit Added or deleted any medications: No Patient Arrived: Ambulatory Any new allergies or adverse reactions: No Arrival Time: 10:27 Had a fall or experienced change in No Transfer Assistance: None activities of daily living that may affect Patient Identification Verified: Yes risk of falls: Secondary Verification Process Completed: Yes Signs or symptoms of abuse/neglect since last visito No Patient Requires Transmission-Based Precautions: No Hospitalized since last visit: No Patient Has Alerts: Yes Implantable device outside of the clinic excluding No Patient Alerts: BP Left Arm Only cellular tissue based products placed in the center since last visit: Has Dressing in Place as Prescribed: Yes Pain Present Now: Yes Electronic Signature(s) Signed: 10/26/2021 5:07:28 PM By: Lorrin Jackson Entered By: Lorrin Jackson on 10/26/2021 10:29:39 -------------------------------------------------------------------------------- Clinic Level of Care Assessment Details Patient Name: Date of Service: MO [name], [name] 10/26/2021 10:15 A M Medical Record Number: [id number] Patient Account Number: [id number] Date of Birth/Sex: Treating RN: [birth year] (66 y.o. [name] Primary Care [name]: Penni Homans Other Clinician: Referring [name]: Treating [name]/Extender: Dyanne Carrel in Treatment: 1 Clinic Level of Care Assessment Items TOOL 4 Quantity Score X- 1 0 Use when only  an EandM is performed on FOLLOW-UP visit ASSESSMENTS - Nursing Assessment / Reassessment X- 1 10 Reassessment of Co-morbidities (includes updates in patient status) X- 1 5 Reassessment of Adherence to Treatment Plan ASSESSMENTS - Wound and Skin A ssessment / Reassessment X - Simple Wound Assessment / Reassessment - one wound 1 5 '[]'$  - 0 Complex Wound Assessment / Reassessment - multiple wounds '[]'$  - 0 Dermatologic / Skin Assessment (not related to wound area) ASSESSMENTS - Focused Assessment '[]'$  - 0 Circumferential Edema Measurements - multi extremities '[]'$  - 0 Nutritional Assessment / Counseling / Intervention '[]'$  - 0 Lower Extremity Assessment (monofilament, tuning fork, pulses) '[]'$  - 0 Peripheral Arterial Disease Assessment (using hand held doppler) ASSESSMENTS - Ostomy and/or Continence Assessment and Care '[]'$  - 0 Incontinence Assessment and Management '[]'$  - 0 Ostomy Care Assessment and Management (repouching, etc.) PROCESS - Coordination of Care '[]'$  - 0 Simple Patient / Family Education for ongoing care X- 1 20 Complex (extensive) Patient / Family Education for ongoing care '[]'$  - 0 Staff obtains Programmer, systems, Records, T Results / Process Orders est '[]'$  - 0 Staff telephones HHA, Nursing Homes / Clarify orders / etc '[]'$  - 0 Routine Transfer to another Facility (non-emergent condition) '[]'$  - 0 Routine Hospital Admission (non-emergent condition) '[]'$  - 0 New Admissions / Biomedical engineer / Ordering NPWT Apligraf, etc. , '[]'$  - 0 Emergency Hospital Admission (emergent condition) '[]'$  - 0 Simple Discharge Coordination '[]'$  - 0 Complex (extensive) Discharge Coordination PROCESS - Special Needs '[]'$  - 0 Pediatric / Minor Patient Management '[]'$  - 0 Isolation Patient Management '[]'$  - 0 Hearing / Language / Visual special needs '[]'$  - 0 Assessment of Community assistance (transportation, D/C planning, etc.) '[]'$  - 0 Additional assistance / Altered mentation '[]'$  - 0 Support Surface(s)  Assessment (bed, cushion, seat, etc.) INTERVENTIONS - Wound Cleansing / Measurement X - Simple Wound Cleansing - one wound 1 5 '[]'$  - 0  Complex Wound Cleansing - multiple wounds X- 1 5 Wound Imaging (photographs - any number of wounds) '[]'$  - 0 Wound Tracing (instead of photographs) X- 1 5 Simple Wound Measurement - one wound '[]'$  - 0 Complex Wound Measurement - multiple wounds INTERVENTIONS - Wound Dressings '[]'$  - 0 Small Wound Dressing one or multiple wounds X- 1 15 Medium Wound Dressing one or multiple wounds '[]'$  - 0 Large Wound Dressing one or multiple wounds '[]'$  - 0 Application of Medications - topical '[]'$  - 0 Application of Medications - injection INTERVENTIONS - Miscellaneous '[]'$  - 0 External ear exam '[]'$  - 0 Specimen Collection (cultures, biopsies, blood, body fluids, etc.) '[]'$  - 0 Specimen(s) / Culture(s) sent or taken to Lab for analysis '[]'$  - 0 Patient Transfer (multiple staff / Civil Service fast streamer / Similar devices) X- 1 5 Simple Staple / Suture removal (25 or less) '[]'$  - 0 Complex Staple / Suture removal (26 or more) '[]'$  - 0 Hypo / Hyperglycemic Management (close monitor of Blood Glucose) '[]'$  - 0 Ankle / Brachial Index (ABI) - do not check if billed separately X- 1 5 Vital Signs Has the patient been seen at the hospital within the last three years: Yes Total Score: 80 Level Of Care: New/Established - Level 3 Electronic Signature(s) Signed: 10/26/2021 5:07:28 PM By: Lorrin Jackson Entered By: Lorrin Jackson on 10/26/2021 10:52:22 -------------------------------------------------------------------------------- Encounter Discharge Information Details Patient Name: Date of Service: [name] Benders K. 10/26/2021 10:15 A M Medical Record Number: [id number] Patient Account Number: [id number] Date of Birth/Sex: Treating RN: 02-20-1956 (66 y.o. [name] Primary Care Keontay Vora: Penni Homans Other Clinician: Referring Tashawnda Bleiler: Treating Cerrone Debold/Extender: Dyanne Carrel in Treatment: 1 Encounter Discharge Information Items Discharge Condition: Stable Ambulatory Status: Ambulatory Discharge Destination: Home Transportation: Private Auto Schedule Follow-up Appointment: Yes Clinical Summary of Care: Provided on 10/26/2021 Form Type Recipient Paper Patient Patient Electronic Signature(s) Signed: 10/26/2021 5:07:28 PM By: Lorrin Jackson Entered By: Lorrin Jackson on 10/26/2021 10:53:01 -------------------------------------------------------------------------------- Lower Extremity Assessment Details Patient Name: Date of Service: [name], [name] 10/26/2021 10:15 A M Medical Record Number: [id number] Patient Account Number: [id number] Date of Birth/Sex: Treating RN: 1955/08/20 (66 y.o. [name] Primary Care Samuel Mcpeek: Penni Homans Other Clinician: Referring Jomari Bartnik: Treating Hashim Eichhorst/Extender: Clemmie Krill Weeks in Treatment: 1 Electronic Signature(s) Signed: 10/26/2021 5:07:28 PM By: Lorrin Jackson Entered By: Lorrin Jackson on 10/26/2021 10:32:32 -------------------------------------------------------------------------------- Multi-Disciplinary Care Plan Details Patient Name: Date of Service: Alveta Heimlich RE, MERTICE UFFELMAN 10/26/2021 10:15 A M Medical Record Number: [id number] Patient Account Number: [id number] Date of Birth/Sex: Treating RN: Oct 09, 1955 (66 y.o. [name] Primary Care Basim Bartnik: Penni Homans Other Clinician: Referring Teodoro Jeffreys: Treating Maryalice Pasley/Extender: Clemmie Krill Weeks in Treatment: 1 Active Inactive Wound/Skin Impairment Nursing Diagnoses: Impaired tissue integrity Goals: Patient/caregiver will verbalize understanding of skin care regimen Date Initiated: 10/19/2021 Target Resolution Date: 11/16/2021 Goal Status: Active Ulcer/skin breakdown will have a volume reduction of 30% by week 4 Date Initiated: 10/19/2021 Target Resolution Date: 11/16/2021 Goal  Status: Active Interventions: Assess patient/caregiver ability to obtain necessary supplies Assess patient/caregiver ability to perform ulcer/skin care regimen upon admission and as needed Assess ulceration(s) every visit Provide education on ulcer and skin care Treatment Activities: Topical wound management initiated : 10/19/2021 Notes: Electronic Signature(s) Signed: 10/26/2021 5:07:28 PM By: Lorrin Jackson Entered By: Lorrin Jackson on 10/26/2021 10:26:33 -------------------------------------------------------------------------------- Pain Assessment Details Patient Name: Date of Service: [name] RE, Burnadette Peter K. 10/26/2021 10:15 A M Medical  Record Number: [id number] Patient Account Number: [id number] Date of Birth/Sex: Treating RN: [birth year] (67 y.o. [name] Primary Care Jaley Yan: Penni Homans Other Clinician: Referring Ambika Zettlemoyer: Treating Keyerra Lamere/Extender: Clemmie Krill Weeks in Treatment: 1 Active Problems Location of Pain Severity and Description of Pain Patient Has Paino No Site Locations Duration of the Pain. Duration of the Pain. Constant / Intermittento Intermittent Rate the pain. Current Pain Level: 4 Character of Pain Describe the Pain: Tender, Throbbing Pain Management and Medication Current Pain Management: Medication: Yes Cold Application: No Rest: Yes Massage: No Activity: No T.E.N.S.: No Heat Application: No Leg drop or elevation: No Is the Current Pain Management Adequate: Adequate How does your wound impact your activities of daily livingo Sleep: No Bathing: No Appetite: No Relationship With Others: No Bladder Continence: No Emotions: No Bowel Continence: No Work: No Toileting: No Drive: No Dressing: No Hobbies: No Electronic Signature(s) Signed: 10/26/2021 5:07:28 PM By: Lorrin Jackson Entered By: Lorrin Jackson on 10/26/2021  10:30:09 -------------------------------------------------------------------------------- Patient/Caregiver Education Details Patient Name: Date of Service: [name] 5/31/2023andnbsp10:15 A M Medical Record Number: [id number] Patient Account Number: [id number] Date of Birth/Gender: Treating RN: 1956/05/28 (66 y.o. [name] Primary Care Physician: Penni Homans Other Clinician: Referring Physician: Treating Physician/Extender: Dyanne Carrel in Treatment: 1 Education Assessment Education Provided To: Patient Education Topics Provided Wound/Skin Impairment: Methods: Explain/Verbal, Printed Responses: State content correctly Electronic Signature(s) Signed: 10/26/2021 5:07:28 PM By: Lorrin Jackson Entered By: Lorrin Jackson on 10/26/2021 10:26:57 -------------------------------------------------------------------------------- Wound Assessment Details Patient Name: Date of Service: [name] 10/26/2021 10:15 A M Medical Record Number: [id number] Patient Account Number: [id number] Date of Birth/Sex: Treating RN: 1956/04/26 (66 y.o. [name] Primary Care Kaidon Kinker: Penni Homans Other Clinician: Referring Maanasa Aderhold: Treating Jazlyn Tippens/Extender: Clemmie Krill Weeks in Treatment: 1 Wound Status Wound Number: 1 Primary Dehisced Wound Etiology: Wound Location: Back Wound Open Wounding Event: Surgical Injury Status: Date Acquired: 09/21/2021 Comorbid Chronic sinus problems/congestion, Anemia, Asthma, Arrhythmia, Weeks Of Treatment: 1 History: Hypertension, Type II Diabetes, Gout, Rheumatoid Arthritis, Clustered Wound: No Neuropathy, Received Chemotherapy Photos Wound Measurements Length: (cm) 7.7 Width: (cm) 0.7 Depth: (cm) 0.6 Area: (cm) 4.233 Volume: (cm) 2.54 % Reduction in Area: 43.9% % Reduction in Volume: 51.9% Epithelialization: Small (1-33%) Tunneling: Yes Position (o'clock): 12 Maximum  Distance: (cm) 0.3 Undermining: No Wound Description Classification: Full Thickness Without Exposed Support Structures Wound Margin: Well defined, not attached Exudate Amount: Medium Exudate Type: Serosanguineous Exudate Color: red, brown Foul Odor After Cleansing: No Slough/Fibrino Yes Wound Bed Granulation Amount: Large (67-100%) Exposed Structure Granulation Quality: Red, Pink Fascia Exposed: No Necrotic Amount: Small (1-33%) Fat Layer (Subcutaneous Tissue) Exposed: Yes Necrotic Quality: Adherent Slough Tendon Exposed: No Muscle Exposed: No Joint Exposed: No Bone Exposed: No Treatment Notes Wound #1 (Back) Cleanser Normal Saline Discharge Instruction: Cleanse the wound with Normal Saline prior to applying a clean dressing using gauze sponges, not tissue or cotton balls. Soap and Water Discharge Instruction: May shower and wash wound with dial antibacterial soap and water prior to dressing change. Peri-Wound Care Topical Primary Dressing IODOFLEX 0.9% Cadexomer Iodine Pad 4x6 cm Discharge Instruction: Apply to wound bed as instructed Secondary Dressing ABD Pad, 5x9 Discharge Instruction: Apply over primary dressing as directed. Woven Gauze Sponge, Non-Sterile 4x4 in Discharge Instruction: Apply over primary dressing as directed. Secured With 83M Medipore H Soft Cloth Surgical T ape, 4 x 10 (in/yd) Discharge Instruction: Secure with tape as directed. Compression  Wrap Compression Stockings Add-Ons Electronic Signature(s) Signed: 10/26/2021 5:07:28 PM By: Lorrin Jackson Entered By: Lorrin Jackson on 10/26/2021 10:48:01 -------------------------------------------------------------------------------- Vitals Details Patient Name: Date of Service: [name]. 10/26/2021 10:15 A M Medical Record Number: [id number] Patient Account Number: [id number] Date of Birth/Sex: Treating RN: [birth year] (66 y.o. [name] Primary Care Monia Timmers: Penni Homans Other  Clinician: Referring Samayra Hebel: Treating Rontae Inglett/Extender: Clemmie Krill Weeks in Treatment: 1 Vital Signs Time Taken: 10:30 Temperature (F): 98.9 Height (in): 66 Pulse (bpm): 91 Weight (lbs): 255 Respiratory Rate (breaths/min): 18 Body Mass Index (BMI): 41.2 Blood Pressure (mmHg): 113/76 Reference Range: 80 - 120 mg / dl Electronic Signature(s) Signed: 10/26/2021 5:07:28 PM By: Lorrin Jackson Entered By: Lorrin Jackson on 10/26/2021 10:32:16

## 2021-10-26 NOTE — Progress Notes (Addendum)
Holly, [name] ([id number]) Visit Report for 10/26/2021 Chief Complaint Document Details Patient Name: Date of Service: Holly, [name] 10/26/2021 10:15 A M Medical Record Number: [id number] Patient Account Number: [id number] Date of Birth/Sex: Treating RN: 1955-10-21 (66 y.o. Sue Lush Primary Care Provider: Penni Homans Other Clinician: Referring Provider: Treating Provider/Extender: Clemmie Krill Weeks in Treatment: 1 Information Obtained from: Patient Chief Complaint Back surgical wound dehiscence Electronic Signature(s) Signed: 10/26/2021 10:19:30 AM By: Worthy Keeler PA-C Entered By: Worthy Keeler on 10/26/2021 10:19:30 -------------------------------------------------------------------------------- HPI Details Patient Name: Date of Service: [name], [name] 10/26/2021 10:15 A M Medical Record Number: [id number] Patient Account Number: [id number] Date of Birth/Sex: Treating RN: 1955-09-23 (66 y.o. Sue Lush Primary Care Provider: Penni Homans Other Clinician: Referring Provider: Treating Provider/Extender: Dyanne Carrel in Treatment: 1 History of Present Illness HPI Description: 10-19-2021 upon evaluation today patient appears to be doing poorly currently in regard to a wound over her back. She had an original surgery which actually was done in April 2023 this was on the 26th. Subsequently she had some issues following and this was leaking and draining she had to go back for repeat reopening of the wound in order to clean this out and subsequently the patient has not really noted significant improvement since that time. She still having a lot of pain and issues during the procedure they did do a culture from the deep portion of the wound when they cleaned this out and it showed Pseudomonas this was not treated however according to the patient. It was told to her that it was not likely representative of infection.  Nonetheless she has continued to have drainage and pain. She does have some dehiscence of the wound yet again there are sutures noted in the base of the wound and I do believe that this is causing some issue with healing to be honest we can have to clean this out and in turn we can have to loosen the stitches in order to allow for appropriate healing from the bottom out. I discussed that with her and actually removed 8 absorbable sutures which were present in the base of the wound. This allowed the wound to open a little bit more and subsequently I do believe that this is going to allow for more appropriate dressing changes in general. The patient did have some discomfort with this but overall I think it was unnecessary action in order to get this to heal appropriately. She definitely voiced understanding in that regard. Otherwise the good news is there are no real deep tunnels or undermining areas which is great news. Patient does have a history of diabetes mellitus type 2, hypertension, but really no other major medical problems. The good news is as far as the reason she went in for surgery which was her back pain and issues there everything seems to be doing much better. 10-26-2021 upon evaluation today patient appears to be doing excellent in regard to her wound. She is tolerating the dressing changes without complication. Fortunately there does not appear to be any evidence of active infection locally or systemically which is great news and overall I do believe that we are on the right track as far as healing is concerned the Iodoflex is doing a great job we may need to switch it come next week as I think that she will be ready and this does burn but nonetheless she is okay with  sticking with it for the next week. Electronic Signature(s) Signed: 10/26/2021 1:11:40 PM By: Worthy Keeler PA-C Entered By: Worthy Keeler on 10/26/2021  13:11:40 -------------------------------------------------------------------------------- Physical Exam Details Patient Name: Date of Service: Holly Holly, [name] 10/26/2021 10:15 A M Medical Record Number: [id number] Patient Account Number: [id number] Date of Birth/Sex: Treating RN: [birth year] (66 y.o. Sue Lush Primary Care Provider: Penni Homans Other Clinician: Referring Provider: Treating Provider/Extender: Clemmie Krill Weeks in Treatment: 1 Constitutional Well-nourished and well-hydrated in no acute distress. Respiratory normal breathing without difficulty. Psychiatric this patient is able to make decisions and demonstrates good insight into disease process. Alert and Oriented x 3. pleasant and cooperative. Notes Upon inspection patient's wound bed actually showed signs of good granulation epithelization at this point. Fortunately I do not see any evidence of infection locally or systemically which is great news and in general I think we are on the right track here. No fevers, chills, nausea, vomiting, or diarrhea. There was 1 more suture at the bottom of the wound that needed to be removed and there was one more internal at the bottom of the wound as well that was pushing through this with a black nonabsorbable suture that apparently was left when the sutures were removed. Either way I was able to get both out today and things look to be doing great. Electronic Signature(s) Signed: 10/26/2021 1:12:11 PM By: Worthy Keeler PA-C Entered By: Worthy Keeler on 10/26/2021 13:12:11 -------------------------------------------------------------------------------- Physician Orders Details Patient Name: Date of Service: [name], [name] 10/26/2021 10:15 A M Medical Record Number: [id number] Patient Account Number: [id number] Date of Birth/Sex: Treating RN: 1955-06-18 (66 y.o. 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 - 11/02/21 @ 10:15 am with Orland Jarred, Room 7 Return A Bathing/ Shower/ Hygiene Do not shower or bathe in tub. Additional Orders / Instructions Follow Nutritious Diet - Increase protein intake Wound Treatment Wound #1 - Back Cleanser: Normal Saline (Generic) 3 x Per Week/30 Days Discharge Instructions: Cleanse the wound with Normal Saline prior to applying a clean dressing using gauze sponges, not tissue or cotton balls. Cleanser: Soap and Water 3 x Per Week/30 Days Discharge Instructions: May shower and wash wound with dial antibacterial soap and water prior to dressing change. Prim Dressing: IODOFLEX 0.9% Cadexomer Iodine Pad 4x6 cm (Generic) 3 x Per Week/30 Days ary Discharge Instructions: Apply to wound bed as instructed Secondary Dressing: ABD Pad, 5x9 (Generic) 3 x Per Week/30 Days Discharge Instructions: Apply over primary dressing as directed. Secondary Dressing: Woven Gauze Sponge, Non-Sterile 4x4 in (Generic) 3 x Per Week/30 Days Discharge Instructions: Apply over primary dressing as directed. Secured With: 36M Medipore H Soft Cloth Surgical T ape, 4 x 10 (in/yd) (Generic) 3 x Per Week/30 Days Discharge Instructions: Secure with tape as directed. Electronic Signature(s) Signed: 10/26/2021 4:11:22 PM By: Worthy Keeler PA-C Signed: 10/26/2021 5:07:28 PM By: Lorrin Jackson Entered By: Lorrin Jackson on 10/26/2021 10:50:12 -------------------------------------------------------------------------------- Problem  List Details Patient Name: Date of Service: Holly Jenetta Downer [name], Holly K. 10/26/2021 10:15 A M Medical  Record Number: [id number] Patient Account Number: [id number] Date of Birth/Sex: Treating RN: 08/04/1955 (66 y.o. 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 10/19/2021 No Yes E11.622 Type 2 diabetes mellitus with other skin ulcer 10/19/2021 No Yes I10 Essential (primary) hypertension 10/19/2021 No Yes Inactive Problems Resolved Problems Electronic Signature(s) Signed: 10/26/2021 4:11:22 PM By: Worthy Keeler PA-C Signed: 10/26/2021 5:07:28 PM By: Lorrin Jackson Previous Signature: 10/26/2021 10:19:21 AM Version By: Worthy Keeler PA-C Entered By: Lorrin Jackson on 10/26/2021 10:26:23 -------------------------------------------------------------------------------- Progress Note Details Patient Name: Date of Service: Holly Jenetta Downer [name], [name] 10/26/2021 10:15 A M Medical Record Number: [id number] Patient Account Number: [id number] Date of Birth/Sex: Treating RN: [birth year] (66 y.o. Sue Lush Primary Care Provider: Penni Homans Other Clinician: Referring Provider: Treating Provider/Extender: Clemmie Krill Weeks in Treatment: 1 Subjective Chief Complaint Information obtained from Patient Back surgical wound dehiscence History of Present Illness (HPI) 10-19-2021 upon evaluation today patient appears to be doing poorly currently in regard to a wound over her back. She had an original surgery which actually was done in April 2023 this was on the 26th. Subsequently she had some issues  following and this was leaking and draining she had to go back for repeat reopening of the wound in order to clean this out and subsequently the patient has not really noted significant improvement since that time. She still having a lot of pain and issues during the procedure they did do a culture from the deep portion of the wound when they cleaned this out and it showed Pseudomonas this was not treated however according to the patient. It was told to her that it was not likely representative of infection. Nonetheless she has continued to have drainage and pain. She does have some dehiscence of the wound yet again there are sutures noted in the base of the wound and I do believe that this is causing some issue with healing to be honest we can have to clean this out and in turn we can have to loosen the stitches in order to allow for appropriate healing from the bottom out. I discussed that with her and actually removed 8 absorbable sutures which were present in the base of the wound. This allowed the wound to open a little bit more and subsequently I do believe that this is going to allow for more appropriate dressing changes in general. The patient did have some discomfort with this but overall I think it was unnecessary action in order to get this to heal appropriately. She definitely voiced understanding in that regard. Otherwise the good news is there are no real deep tunnels or undermining areas which is great news. Patient does have a history of diabetes mellitus type 2, hypertension, but really no other major medical problems. The good news is as far as the reason she went in for surgery which was her back pain and issues there everything seems to be doing much better. 10-26-2021 upon evaluation today patient appears to be doing excellent in regard to her wound. She is tolerating the dressing changes without complication. Fortunately there does not appear to be any evidence of active infection  locally or systemically which is great  news and overall I do believe that we are on the right track as far as healing is concerned the Iodoflex is doing a great job we may need to switch it come next week as I think that she will be ready and this does burn but nonetheless she is okay with sticking with it for the next week. Objective Constitutional Well-nourished and well-hydrated in no acute distress. Vitals Time Taken: 10:30 AM, Height: 66 in, Weight: 255 lbs, BMI: 41.2, Temperature: 98.9 F, Pulse: 91 bpm, Respiratory Rate: 18 breaths/min, Blood Pressure: 113/76 mmHg. Respiratory normal breathing without difficulty. Psychiatric this patient is able to make decisions and demonstrates good insight into disease process. Alert and Oriented x 3. pleasant and cooperative. General Notes: Upon inspection patient's wound bed actually showed signs of good granulation epithelization at this point. Fortunately I do not see any evidence of infection locally or systemically which is great news and in general I think we are on the right track here. No fevers, chills, nausea, vomiting, or diarrhea. There was 1 more suture at the bottom of the wound that needed to be removed and there was one more internal at the bottom of the wound as well that was pushing through this with a black nonabsorbable suture that apparently was left when the sutures were removed. Either way I was able to get both out today and things look to be doing great. Integumentary (Hair, Skin) Wound #1 status is Open. Original cause of wound was Surgical Injury. The date acquired was: 09/21/2021. The wound has been in treatment 1 weeks. The wound is located on the Back. The wound measures 7.7cm length x 0.7cm width x 0.6cm depth; 4.233cm^2 area and 2.54cm^3 volume. There is Fat Layer (Subcutaneous Tissue) exposed. There is no undermining noted, however, there is tunneling at 12:00 with a maximum distance of 0.3cm. There is a medium amount  of serosanguineous drainage noted. The wound margin is well defined and not attached to the wound base. There is large (67-100%) red, pink granulation within the wound bed. There is a small (1-33%) amount of necrotic tissue within the wound bed including Adherent Slough. 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 - 11/02/21 @ 10:15 am with Orland Jarred, Room 7 Bathing/ Shower/ Hygiene: Do not shower or bathe in tub. Additional Orders / Instructions: Follow Nutritious Diet - Increase protein intake WOUND #1: - Back Wound Laterality: Cleanser: Normal Saline (Generic) 3 x Per Week/30 Days Discharge Instructions: Cleanse the wound with Normal Saline prior to applying a clean dressing using gauze sponges, not tissue or cotton balls. Cleanser: Soap and Water 3 x Per Week/30 Days Discharge Instructions: May shower and wash wound with dial antibacterial soap and water prior to dressing change. Prim Dressing: IODOFLEX 0.9% Cadexomer Iodine Pad 4x6 cm (Generic) 3 x Per Week/30 Days ary Discharge Instructions: Apply to wound bed as instructed Secondary Dressing: ABD Pad, 5x9 (Generic) 3 x Per Week/30 Days Discharge Instructions: Apply over primary dressing as directed. Secondary Dressing: Woven Gauze Sponge, Non-Sterile 4x4 in (Generic) 3 x Per Week/30 Days Discharge Instructions: Apply over primary dressing as directed. Secured With: 38M Medipore H Soft Cloth Surgical T ape, 4 x 10 (in/yd) (Generic) 3 x Per Week/30 Days Discharge Instructions: Secure with tape as directed. 1. I would  recommend currently that we going continue with wound care measures as before the patient is in agreement plan of reducing the Iodoflex for  1 more week and then hopefully will be able to switch over from there. 2. I am also going to recommend that we have the patient continue to monitor for any signs of worsening or infection. Office if anything changes she should let me know. We will see patient back for reevaluation in 1 week here in the clinic. If anything worsens or changes patient will contact our office for additional recommendations. Electronic Signature(s) Signed: 10/26/2021 1:12:47 PM By: Worthy Keeler PA-C Entered By: Worthy Keeler on 10/26/2021 13:12:46 -------------------------------------------------------------------------------- SuperBill Details Patient Name: Date of Service: [name], [name] 10/26/2021 Medical Record Number: [id number] Patient Account Number: [id number] Date of Birth/Sex: Treating RN: 26-Jan-1956 (66 y.o. 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 E11.622 Type 2 diabetes mellitus with other skin ulcer I10 Essential (primary) hypertension Facility Procedures CPT4 Code: [id number] Description: 99213 - WOUND CARE VISIT-LEV 3 EST PT Modifier: Quantity: 1 Physician Procedures : CPT4 Code Description Modifier [id number] - WC PHYS LEVEL 4 - EST PT ICD-10 Diagnosis Description T81.31XA Disruption of external operation (surgical) wound, not elsewhere classified, initial encounter L98.422 Non-pressure chronic ulcer of back with  fat layer exposed E11.622 Type 2 diabetes mellitus with other skin ulcer I10 Essential (primary) hypertension Quantity: 1 Electronic Signature(s) Signed: 10/26/2021 1:13:08 PM By: Worthy Keeler PA-C Entered By: Worthy Keeler on 10/26/2021 13:13:07

## 2021-10-31 ENCOUNTER — Ambulatory Visit: Payer: Medicare Other | Admitting: Family Medicine

## 2021-10-31 DIAGNOSIS — M4726 Other spondylosis with radiculopathy, lumbar region: Secondary | ICD-10-CM | POA: Diagnosis not present

## 2021-11-02 ENCOUNTER — Encounter (HOSPITAL_BASED_OUTPATIENT_CLINIC_OR_DEPARTMENT_OTHER): Payer: Medicare Other | Attending: Physician Assistant | Admitting: Internal Medicine

## 2021-11-02 DIAGNOSIS — T8131XA Disruption of external operation (surgical) wound, not elsewhere classified, initial encounter: Secondary | ICD-10-CM | POA: Diagnosis not present

## 2021-11-02 DIAGNOSIS — X58XXXA Exposure to other specified factors, initial encounter: Secondary | ICD-10-CM | POA: Insufficient documentation

## 2021-11-02 DIAGNOSIS — E11622 Type 2 diabetes mellitus with other skin ulcer: Secondary | ICD-10-CM | POA: Diagnosis not present

## 2021-11-02 DIAGNOSIS — I10 Essential (primary) hypertension: Secondary | ICD-10-CM | POA: Insufficient documentation

## 2021-11-02 DIAGNOSIS — L98422 Non-pressure chronic ulcer of back with fat layer exposed: Secondary | ICD-10-CM | POA: Diagnosis not present

## 2021-11-02 NOTE — Progress Notes (Signed)
CALEIGHA, ZALE ([id number]) Visit Report for 11/02/2021 HPI Details Patient Name: Date of Service: [name], [name] 11/02/2021 10:15 A M Medical Record Number: [id number] Patient Account Number: [id number] Date of Birth/Sex: Treating RN: 06-Apr-1956 (66 y.o. Sue Lush Primary Care Provider: Penni Homans Other Clinician: Referring Provider: Treating Provider/Extender: Eliezer Champagne in Treatment: 2 History of Present Illness HPI Description: 10-19-2021 upon evaluation today patient appears to be doing poorly currently in regard to a wound over her back. She had an original surgery which actually was done in April 2023 this was on the 26th. Subsequently she had some issues following and this was leaking and draining she had to go back for repeat reopening of the wound in order to clean this out and subsequently the patient has not really noted significant improvement since that time. She still having a lot of pain and issues during the procedure they did do a culture from the deep portion of the wound when they cleaned this out and it showed Pseudomonas this was not treated however according to the patient. It was told to her that it was not likely representative of infection. Nonetheless she has continued to have drainage and pain. She does have some dehiscence of the wound yet again there are sutures noted in the base of the wound and I do believe that this is causing some issue with healing to be honest we can have to clean this out and in turn we can have to loosen the stitches in order to allow for appropriate healing from the bottom out. I discussed that with her and actually removed 8 absorbable sutures which were present in the base of the wound. This allowed the wound to open a little bit more and subsequently I do believe that this is going to allow for more appropriate dressing changes in general. The patient did have some discomfort with this but overall I  think it was unnecessary action in order to get this to heal appropriately. She definitely voiced understanding in that regard. Otherwise the good news is there are no real deep tunnels or undermining areas which is great news. Patient does have a history of diabetes mellitus type 2, hypertension, but really no other major medical problems. The good news is as far as the reason she went in for surgery which was her back pain and issues there everything seems to be doing much better. 10-26-2021 upon evaluation today patient appears to be doing excellent in regard to her wound. She is tolerating the dressing changes without complication. Fortunately there does not appear to be any evidence of active infection locally or systemically which is great news and overall I do believe that we are on the right track as far as healing is concerned the Iodoflex is doing a great job we may need to switch it come next week as I think that she will be ready and this does burn but nonetheless she is okay with sticking with it for the next week. 6/7; surgical wound on her back. She had a tunnel superiorly but that close down completely today. Been using Iodoflex the granulation tissue really looks quite healthy. She is completing antibiotics that we prescribed. Her only complaint was that the Iodoflex stings for about 5 hours Electronic Signature(s) Signed: 11/02/2021 2:39:25 PM By: Fredirick Maudlin MD FACS Signed: 11/02/2021 5:09:19 PM By: Linton Ham MD Entered By: Fredirick Maudlin on 11/02/2021 14:39:25 -------------------------------------------------------------------------------- Physical Exam Details Patient Name: Date of Service:  [name], [name] 11/02/2021 10:15 A M Medical Record Number: [id number] Patient Account Number: [id number] Date of Birth/Sex: Treating RN: [birth year] (66 y.o. Sue Lush Primary Care Provider: Penni Homans Other Clinician: Referring Provider: Treating Provider/Extender:  Eliezer Champagne in Treatment: 2 Constitutional Sitting or standing Blood Pressure is within target range for patient.. Pulse regular and within target range for patient.Marland Kitchen Respirations regular, non-labored and within target range.. Temperature is normal and within the target range for the patient.Marland Kitchen Appears in no distress. Notes Wound exam; the surgical wound bed has nice looking granulation. No debridement is required. Superiorly there is no tunnel here this is to close down completely. There is no evidence of surrounding erythema no circumferential tenderness. Electronic Signature(s) Signed: 11/02/2021 2:39:40 PM By: Fredirick Maudlin MD FACS Signed: 11/02/2021 5:09:19 PM By: Linton Ham MD Entered By: Fredirick Maudlin on 11/02/2021 14:39:39 -------------------------------------------------------------------------------- Physician Orders Details Patient Name: Date of Service: [name], [name] 11/02/2021 10:15 A M Medical Record Number: [id number] Patient Account Number: [id number] Date of Birth/Sex: Treating RN: [birth year] (66 y.o. 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 - 11/09/21 @ 3:30pm am with Orland Jarred, Room 7 Return A Bathing/ Shower/ Hygiene Do not shower or bathe in tub. Additional Orders / Instructions Follow Nutritious Diet - Increase protein intake Wound Treatment Wound #1 - Back Cleanser: Normal Saline (Generic) Every Other Day/30  Days Discharge Instructions: Cleanse the wound with Normal Saline prior to applying a clean dressing using gauze sponges, not tissue or cotton balls. Cleanser: Soap and Water Every Other Day/30 Days Discharge Instructions: May shower and wash wound with dial antibacterial soap and water prior to dressing change. Prim Dressing: Promogran Prisma Matrix, 4.34 (sq in) (silver collagen) (DME) (Generic) Every Other Day/30 Days ary Discharge Instructions: Moisten collagen with saline or hydrogel Secondary Dressing: ABD Pad, 5x9 (Generic) Every Other Day/30 Days Discharge Instructions: Apply over primary dressing as directed. Secondary Dressing: Woven Gauze Sponge, Non-Sterile 4x4 in (Generic) Every Other Day/30 Days Discharge Instructions: Apply over primary dressing as directed. Secured With: 37M Medipore H Soft Cloth Surgical T ape, 4 x 10 (in/yd) (Generic) Every Other Day/30 Days Discharge Instructions: Secure with tape as directed. Electronic Signature(s) Signed: 11/02/2021 2:39:56 PM By: Fredirick Maudlin MD FACS Signed: 11/02/2021 5:09:19 PM By: Linton Ham MD Previous Signature: 11/02/2021 12:13:52 PM Version By: Lorrin Jackson Entered By: Fredirick Maudlin on 11/02/2021 14:39:55 -------------------------------------------------------------------------------- Problem List Details Patient Name: Date of Service: [name], [name] 11/02/2021 10:15 A M Medical Record Number: [id number] Patient Account Number: [id number] Date of Birth/Sex: Treating RN: 04/18/1956 (66 y.o. Sue Lush Primary Care Provider: Penni Homans Other Clinician: Referring Provider: Treating Provider/Extender: Eliezer Champagne in Treatment: 2 Active Problems ICD-10 Encounter Code Description Active Date MDM Diagnosis T81.31XA Disruption of external operation (surgical) wound, not elsewhere classified, 10/19/2021 No Yes initial encounter L98.422 Non-pressure chronic ulcer of back with fat  layer exposed 10/19/2021 No Yes E11.622 Type 2 diabetes mellitus with other skin ulcer 10/19/2021 No Yes I10 Essential (primary) hypertension 10/19/2021 No Yes Inactive Problems Resolved Problems  Electronic Signature(s) Signed: 11/02/2021 2:38:53 PM By: Fredirick Maudlin MD FACS Signed: 11/02/2021 5:09:19 PM By: Linton Ham MD Entered By: Fredirick Maudlin on 11/02/2021 14:38:53 -------------------------------------------------------------------------------- Progress Note Details Patient Name: Date of Service: [name], [name] 11/02/2021 10:15 A M Medical Record Number: [id number] Patient Account Number: [id number] Date of Birth/Sex: Treating RN: 03/24/1956 (66 y.o. Sue Lush Primary Care Provider: Penni Homans Other Clinician: Referring Provider: Treating Provider/Extender: Eliezer Champagne in Treatment: 2 Subjective History of Present Illness (HPI) 10-19-2021 upon evaluation today patient appears to be doing poorly currently in regard to a wound over her back. She had an original surgery which actually was done in April 2023 this was on the 26th. Subsequently she had some issues following and this was leaking and draining she had to go back for repeat reopening of the wound in order to clean this out and subsequently the patient has not really noted significant improvement since that time. She still having a lot of pain and issues during the procedure they did do a culture from the deep portion of the wound when they cleaned this out and it showed Pseudomonas this was not treated however according to the patient. It was told to her that it was not likely representative of infection. Nonetheless she has continued to have drainage and pain. She does have some dehiscence of the wound yet again there are sutures noted in the base of the wound and I do believe that this is causing some issue with healing to be honest we can have to clean this out and in turn we can  have to loosen the stitches in order to allow for appropriate healing from the bottom out. I discussed that with her and actually removed 8 absorbable sutures which were present in the base of the wound. This allowed the wound to open a little bit more and subsequently I do believe that this is going to allow for more appropriate dressing changes in general. The patient did have some discomfort with this but overall I think it was unnecessary action in order to get this to heal appropriately. She definitely voiced understanding in that regard. Otherwise the good news is there are no real deep tunnels or undermining areas which is great news. Patient does have a history of diabetes mellitus type 2, hypertension, but really no other major medical problems. The good news is as far as the reason she went in for surgery which was her back pain and issues there everything seems to be doing much better. 10-26-2021 upon evaluation today patient appears to be doing excellent in regard to her wound. She is tolerating the dressing changes without complication. Fortunately there does not appear to be any evidence of active infection locally or systemically which is great news and overall I do believe that we are on the right track as far as healing is concerned the Iodoflex is doing a great job we may need to switch it come next week as I think that she will be ready and this does burn but nonetheless she is okay with sticking with it for the next week. 6/7; surgical wound on her back. She had a tunnel superiorly but that close down completely today. Been using Iodoflex the granulation tissue really looks quite healthy. She is completing antibiotics that we prescribed. Her only complaint was that the Iodoflex stings for about 5 hours Objective Constitutional Sitting or standing Blood Pressure is within target range for patient.. Pulse regular  and within target range for patient.Marland Kitchen Respirations regular, non-labored  and within target range.. Temperature is normal and within the target range for the patient.Marland Kitchen Appears in no distress. Vitals Time Taken: 10:07 AM, Height: 66 in, Weight: 255 lbs, BMI: 41.2, Temperature: 99.2 F, Pulse: 98 bpm, Respiratory Rate: 18 breaths/min, Blood Pressure: 124/81 mmHg. General Notes: Wound exam; the surgical wound bed has nice looking granulation. No debridement is required. Superiorly there is no tunnel here this is to close down completely. There is no evidence of surrounding erythema no circumferential tenderness. Integumentary (Hair, Skin) Wound #1 status is Open. Original cause of wound was Surgical Injury. The date acquired was: 09/21/2021. The wound has been in treatment 2 weeks. The wound is located on the Back. The wound measures 7.6cm length x 0.7cm width x 0.6cm depth; 4.178cm^2 area and 2.507cm^3 volume. There is Fat Layer (Subcutaneous Tissue) exposed. There is no tunneling or undermining noted. There is a medium amount of serosanguineous drainage noted. The wound margin is well defined and not attached to the wound base. There is large (67-100%) red, pink granulation within the wound bed. There is a small (1-33%) amount of necrotic tissue within the wound bed including Adherent Slough. 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 - 11/09/21 @ 3:30pm am with Orland Jarred, Room 7 Bathing/ Shower/ Hygiene: Do not shower or bathe in tub. Additional Orders / Instructions: Follow Nutritious Diet - Increase protein intake WOUND #1: - Back Wound Laterality: Cleanser: Normal Saline (Generic) Every Other Day/30 Days Discharge Instructions: Cleanse the wound with Normal Saline prior to  applying a clean dressing using gauze sponges, not tissue or cotton balls. Cleanser: Soap and Water Every Other Day/30 Days Discharge Instructions: May shower and wash wound with dial antibacterial soap and water prior to dressing change. Prim Dressing: Promogran Prisma Matrix, 4.34 (sq in) (silver collagen) (DME) (Generic) Every Other Day/30 Days ary Discharge Instructions: Moisten collagen with saline or hydrogel Secondary Dressing: ABD Pad, 5x9 (Generic) Every Other Day/30 Days Discharge Instructions: Apply over primary dressing as directed. Secondary Dressing: Woven Gauze Sponge, Non-Sterile 4x4 in (Generic) Every Other Day/30 Days Discharge Instructions: Apply over primary dressing as directed. Secured With: 33M Medipore H Soft Cloth Surgical T ape, 4 x 10 (in/yd) (Generic) Every Other Day/30 Days Discharge Instructions: Secure with tape as directed. 1. I change the primary dressing to Prisma hydrogel. I am hopeful that this will stimulate the granulation to fill in the remaining wound. 2. As mentioned the superior tunnel is closed. There is nothing that requires debridement on this. I think we can safely discontinue the Iodoflex 3. She does not require any additional antibiotics either systemic or topical that I can determine. 4. Her daughter changes the dressing, according to our intake nurse doing a good job Engineer, maintenance) Signed: 11/02/2021 2:40:19 PM By: Fredirick Maudlin MD FACS Signed: 11/02/2021 5:09:19 PM By: Linton Ham MD Entered By: Fredirick Maudlin on 11/02/2021 14:40:19 -------------------------------------------------------------------------------- SuperBill Details Patient Name: Date of Service: [name], [name] 11/02/2021 Medical Record Number: [id number] Patient Account Number: [id number] Date of Birth/Sex: Treating RN: 05/12/1956 (66 y.o. 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 E11.622 Type 2 diabetes mellitus with other skin ulcer I10 Essential (primary) hypertension Facility Procedures CPT4 Code: [id number] Description: 99213 - WOUND CARE VISIT-LEV 3 EST PT Modifier: Quantity: 1 Physician Procedures : CPT4 Code Description Modifier [id number] - WC PHYS LEVEL 4 - EST PT ICD-10 Diagnosis Description T81.31XA Disruption of external operation (surgical) wound, not elsewhere classified, initial encounter L98.422 Non-pressure chronic ulcer of back with  fat layer exposed Quantity: 1 Electronic Signature(s) Signed: 11/02/2021 2:40:46 PM By: Fredirick Maudlin MD FACS Signed: 11/02/2021 5:09:19 PM By: Linton Ham MD Entered By: Fredirick Maudlin on 11/02/2021 14:40:46

## 2021-11-03 ENCOUNTER — Ambulatory Visit
Admission: RE | Admit: 2021-11-03 | Discharge: 2021-11-03 | Disposition: A | Discharge status: Home / Self Care | Payer: Medicare Other | Source: Ambulatory Visit | Attending: Obstetrics and Gynecology | Admitting: Obstetrics and Gynecology

## 2021-11-03 ENCOUNTER — Ambulatory Visit: Payer: Medicare Other

## 2021-11-03 DIAGNOSIS — G4733 Obstructive sleep apnea (adult) (pediatric): Secondary | ICD-10-CM | POA: Diagnosis not present

## 2021-11-03 DIAGNOSIS — Z1231 Encounter for screening mammogram for malignant neoplasm of breast: Secondary | ICD-10-CM | POA: Diagnosis not present

## 2021-11-03 DIAGNOSIS — L98422 Non-pressure chronic ulcer of back with fat layer exposed: Secondary | ICD-10-CM | POA: Diagnosis not present

## 2021-11-03 DIAGNOSIS — M170 Bilateral primary osteoarthritis of knee: Secondary | ICD-10-CM | POA: Diagnosis not present

## 2021-11-03 DIAGNOSIS — M1711 Unilateral primary osteoarthritis, right knee: Secondary | ICD-10-CM | POA: Diagnosis not present

## 2021-11-03 DIAGNOSIS — I10 Essential (primary) hypertension: Secondary | ICD-10-CM | POA: Diagnosis not present

## 2021-11-08 NOTE — Progress Notes (Signed)
DEMIA, VIERA ([id number]) Visit Report for 11/02/2021 Arrival Information Details Patient Name: Date of Service: Holly [name], [name] 11/02/2021 10:15 A M Medical Record Number: [id number] Patient Account Number: [id number] Date of Birth/Sex: Treating RN: April 30, 1956 (66 y.Holly. Sue Lush Primary Care Mayerli Kirst: Penni Homans Other Clinician: Referring Nicole Hafley: Treating Despina Boan/Extender: Forde Dandy in Treatment: 2 Visit Information History Since Last Visit Added or deleted any medications: No Patient Arrived: Ambulatory Any new allergies or adverse reactions: No Arrival Time: 10:06 Had a fall or experienced change in No Accompanied By: self activities of daily living that may affect Transfer Assistance: None risk of falls: Patient Identification Verified: Yes Signs or symptoms of abuse/neglect since last visito No Secondary Verification Process Completed: Yes Hospitalized since last visit: No Patient Requires Transmission-Based Precautions: No Implantable device outside of the clinic excluding No Patient Has Alerts: Yes cellular tissue based products placed in the center Patient Alerts: BP Left Arm Only since last visit: Has Dressing in Place as Prescribed: Yes Pain Present Now: No Electronic Signature(s) Signed: 11/08/2021 8:46:10 AM By: Erenest Blank Entered By: Erenest Blank on 11/02/2021 10:07:33 -------------------------------------------------------------------------------- Clinic Level of Care Assessment Details Patient Name: Date of Service: Holly [name], [name] 11/02/2021 10:15 A M Medical Record Number: [id number] Patient Account Number: [id number] Date of Birth/Sex: Treating RN: 26-Jun-1955 (70 y.Holly. Sue Lush Primary Care Aedon Deason: Penni Homans Other Clinician: Referring Langston Summerfield: Treating Zunairah Devers/Extender: Forde Dandy in Treatment: 2 Clinic Level of Care Assessment Items TOOL 4 Quantity Score X- 1  0 Use when only an EandM is performed on FOLLOW-UP visit ASSESSMENTS - Nursing Assessment / Reassessment X- 1 10 Reassessment of Co-morbidities (includes updates in patient status) X- 1 5 Reassessment of Adherence to Treatment Plan ASSESSMENTS - Wound and Skin A ssessment / Reassessment X - Simple Wound Assessment / Reassessment - one wound 1 5 '[]'$  - 0 Complex Wound Assessment / Reassessment - multiple wounds '[]'$  - 0 Dermatologic / Skin Assessment (not related to wound area) ASSESSMENTS - Focused Assessment '[]'$  - 0 Circumferential Edema Measurements - multi extremities '[]'$  - 0 Nutritional Assessment / Counseling / Intervention '[]'$  - 0 Lower Extremity Assessment (monofilament, tuning fork, pulses) '[]'$  - 0 Peripheral Arterial Disease Assessment (using hand held doppler) ASSESSMENTS - Ostomy and/or Continence Assessment and Care '[]'$  - 0 Incontinence Assessment and Management '[]'$  - 0 Ostomy Care Assessment and Management (repouching, etc.) PROCESS - Coordination of Care '[]'$  - 0 Simple Patient / Family Education for ongoing care X- 1 20 Complex (extensive) Patient / Family Education for ongoing care X- 1 10 Staff obtains Programmer, systems, Records, T Results / Process Orders est '[]'$  - 0 Staff telephones HHA, Nursing Homes / Clarify orders / etc '[]'$  - 0 Routine Transfer to another Facility (non-emergent condition) '[]'$  - 0 Routine Hospital Admission (non-emergent condition) '[]'$  - 0 New Admissions / Biomedical engineer / Ordering NPWT Apligraf, etc. , '[]'$  - 0 Emergency Hospital Admission (emergent condition) '[]'$  - 0 Simple Discharge Coordination '[]'$  - 0 Complex (extensive) Discharge Coordination PROCESS - Special Needs '[]'$  - 0 Pediatric / Minor Patient Management '[]'$  - 0 Isolation Patient Management '[]'$  - 0 Hearing / Language / Visual special needs '[]'$  - 0 Assessment of Community assistance (transportation, D/C planning, etc.) '[]'$  - 0 Additional assistance / Altered mentation '[]'$  -  0 Support Surface(s) Assessment (bed, cushion, seat, etc.) INTERVENTIONS - Wound Cleansing / Measurement X - Simple Wound Cleansing - one wound 1 5 '[]'$  -  0 Complex Wound Cleansing - multiple wounds X- 1 5 Wound Imaging (photographs - any number of wounds) '[]'$  - 0 Wound Tracing (instead of photographs) X- 1 5 Simple Wound Measurement - one wound '[]'$  - 0 Complex Wound Measurement - multiple wounds INTERVENTIONS - Wound Dressings '[]'$  - 0 Small Wound Dressing one or multiple wounds X- 1 15 Medium Wound Dressing one or multiple wounds '[]'$  - 0 Large Wound Dressing one or multiple wounds '[]'$  - 0 Application of Medications - topical '[]'$  - 0 Application of Medications - injection INTERVENTIONS - Miscellaneous '[]'$  - 0 External ear exam '[]'$  - 0 Specimen Collection (cultures, biopsies, blood, body fluids, etc.) '[]'$  - 0 Specimen(s) / Culture(s) sent or taken to Lab for analysis '[]'$  - 0 Patient Transfer (multiple staff / Civil Service fast streamer / Similar devices) '[]'$  - 0 Simple Staple / Suture removal (25 or less) '[]'$  - 0 Complex Staple / Suture removal (26 or more) '[]'$  - 0 Hypo / Hyperglycemic Management (close monitor of Blood Glucose) '[]'$  - 0 Ankle / Brachial Index (ABI) - do not check if billed separately X- 1 5 Vital Signs Has the patient been seen at the hospital within the last three years: Yes Total Score: 85 Level Of Care: New/Established - Level 3 Electronic Signature(s) Signed: 11/02/2021 12:13:52 PM By: Lorrin Jackson Entered By: Lorrin Jackson on 11/02/2021 10:59:00 -------------------------------------------------------------------------------- Encounter Discharge Information Details Patient Name: Date of Service: Holly Holly Benders K. 11/02/2021 10:15 A M Medical Record Number: [id number] Patient Account Number: [id number] Date of Birth/Sex: Treating RN: 1955/10/15 (78 y.Holly. Sue Lush Primary Care Melinda Pottinger: Penni Homans Other Clinician: Referring Rilea Arutyunyan: Treating Tersea Aulds/Extender:  Forde Dandy in Treatment: 2 Encounter Discharge Information Items Discharge Condition: Stable Ambulatory Status: Ambulatory Discharge Destination: Home Transportation: Private Auto Schedule Follow-up Appointment: Yes Clinical Summary of Care: Provided on 11/02/2021 Form Type Recipient Paper Patient Patient Electronic Signature(s) Signed: 11/02/2021 12:13:52 PM By: Lorrin Jackson Entered By: Lorrin Jackson on 11/02/2021 10:59:54 -------------------------------------------------------------------------------- Lower Extremity Assessment Details Patient Name: Date of Service: Holly AISIA, CORREIRA 11/02/2021 10:15 A M Medical Record Number: [id number] Patient Account Number: [id number] Date of Birth/Sex: Treating RN: 24-May-1956 (42 y.Holly. Sue Lush Primary Care Davene Jobin: Penni Homans Other Clinician: Referring Ameir Faria: Treating Oshea Percival/Extender: Earley Abide Weeks in Treatment: 2 Electronic Signature(s) Signed: 11/02/2021 12:13:52 PM By: Lorrin Jackson Signed: 11/08/2021 8:46:10 AM By: Erenest Blank Entered By: Erenest Blank on 11/02/2021 10:08:25 -------------------------------------------------------------------------------- Multi Wound Chart Details Patient Name: Date of Service: Holly Jenetta Downer [name], [name] 11/02/2021 10:15 A M Medical Record Number: [id number] Patient Account Number: [id number] Date of Birth/Sex: Treating RN: September 20, 1955 (8 y.Holly. Sue Lush Primary Care Keily Lepp: Penni Homans Other Clinician: Referring Bertie Mcconathy: Treating Emi Lymon/Extender: Eliezer Champagne in Treatment: 2 Vital Signs Height(in): 37 Pulse(bpm): 58 Weight(lbs): 38 Blood Pressure(mmHg): 124/81 Body Mass Index(BMI): 41.2 Temperature(F): 99.2 Respiratory Rate(breaths/min): 18 Photos: [N/A:N/A] Back N/A N/A Wound Location: Surgical Injury N/A N/A Wounding Event: Dehisced Wound N/A N/A Primary Etiology: Chronic sinus  problems/congestion, N/A N/A Comorbid History: Anemia, Asthma, Arrhythmia, Hypertension, Type II Diabetes, Gout, Rheumatoid Arthritis, Neuropathy, Received Chemotherapy 09/21/2021 N/A N/A Date Acquired: 2 N/A N/A Weeks of Treatment: Open N/A N/A Wound Status: No N/A N/A Wound Recurrence: 7.6x0.7x0.6 N/A N/A Measurements L x W x D (cm) 4.178 N/A N/A A (cm) : rea 2.507 N/A N/A Volume (cm) : 44.60% N/A N/A % Reduction in Area: 52.50% N/A N/A % Reduction in Volume: Full Thickness Without Exposed  N/A N/A Classification: Support Structures Medium N/A N/A Exudate Amount: Serosanguineous N/A N/A Exudate Type: red, brown N/A N/A Exudate Color: Well defined, not attached N/A N/A Wound Margin: Large (67-100%) N/A N/A Granulation Amount: Red, Pink N/A N/A Granulation Quality: Small (1-33%) N/A N/A Necrotic Amount: Fat Layer (Subcutaneous Tissue): Yes N/A N/A Exposed Structures: Fascia: No Tendon: No Muscle: No Joint: No Bone: No Small (1-33%) N/A N/A Epithelialization: Treatment Notes Wound #1 (Back) Cleanser Normal Saline Discharge Instruction: Cleanse the wound with Normal Saline prior to applying a clean dressing using gauze sponges, not tissue or cotton balls. Soap and Water Discharge Instruction: May shower and wash wound with dial antibacterial soap and water prior to dressing change. Peri-Wound Care Topical Primary Dressing Promogran Prisma Matrix, 4.34 (sq in) (silver collagen) Discharge Instruction: Moisten collagen with saline or hydrogel Secondary Dressing ABD Pad, 5x9 Discharge Instruction: Apply over primary dressing as directed. Woven Gauze Sponge, Non-Sterile 4x4 in Discharge Instruction: Apply over primary dressing as directed. Secured With 58M Medipore H Soft Cloth Surgical T ape, 4 x 10 (in/yd) Discharge Instruction: Secure with tape as directed. Compression Wrap Compression Stockings Add-Ons Electronic Signature(s) Signed: 11/02/2021  2:39:04 PM By: Fredirick Maudlin MD FACS Signed: 11/03/2021 4:46:06 PM By: Lorrin Jackson Previous Signature: 11/02/2021 12:13:52 PM Version By: Fara Chute By: Fredirick Maudlin on 11/02/2021 14:39:04 -------------------------------------------------------------------------------- Multi-Disciplinary Care Plan Details Patient Name: Date of Service: Alveta Heimlich [name], Holly KALKA. 11/02/2021 10:15 A M Medical Record Number: [id number] Patient Account Number: [id number] Date of Birth/Sex: Treating RN: [birth year] (63 y.Holly. Sue Lush Primary Care Troi Bechtold: Penni Homans Other Clinician: Referring Girtie Wiersma: Treating Teela Narducci/Extender: Forde Dandy in Treatment: 2 Active Inactive Wound/Skin Impairment Nursing Diagnoses: Impaired tissue integrity Goals: Patient/caregiver will verbalize understanding of skin care regimen Date Initiated: 10/19/2021 Target Resolution Date: 11/16/2021 Goal Status: Active Ulcer/skin breakdown will have a volume reduction of 30% by week 4 Date Initiated: 10/19/2021 Target Resolution Date: 11/16/2021 Goal Status: Active Interventions: Assess patient/caregiver ability to obtain necessary supplies Assess patient/caregiver ability to perform ulcer/skin care regimen upon admission and as needed Assess ulceration(s) every visit Provide education on ulcer and skin care Treatment Activities: Topical wound management initiated : 10/19/2021 Notes: Electronic Signature(s) Signed: 11/02/2021 12:13:52 PM By: Lorrin Jackson Entered By: Lorrin Jackson on 11/02/2021 10:42:46 -------------------------------------------------------------------------------- Pain Assessment Details Patient Name: Date of Service: [name] 11/02/2021 10:15 A M Medical Record Number: [id number] Patient Account Number: [id number] Date of Birth/Sex: Treating RN: May 11, 1956 (37 y.Holly. Sue Lush Primary Care Herberto Ledwell: Penni Homans Other Clinician: Referring  Fabiha Rougeau: Treating Konor Noren/Extender: Earley Abide Weeks in Treatment: 2 Active Problems Location of Pain Severity and Description of Pain Patient Has Paino No Site Locations Pain Management and Medication Current Pain Management: Electronic Signature(s) Signed: 11/02/2021 12:13:52 PM By: Lorrin Jackson Signed: 11/08/2021 8:46:10 AM By: Erenest Blank Entered By: Erenest Blank on 11/02/2021 10:08:12 -------------------------------------------------------------------------------- Patient/Caregiver Education Details Patient Name: Date of Service: [name] 6/7/2023andnbsp10:15 A M Medical Record Number: [id number] Patient Account Number: [id number] Date of Birth/Gender: Treating RN: [birth year] (26 y.Holly. Sue Lush Primary Care Physician: Penni Homans Other Clinician: Referring Physician: Treating Physician/Extender: Forde Dandy in Treatment: 2 Education Assessment Education Provided To: Patient Education Topics Provided Wound/Skin Impairment: Methods: Demonstration, Explain/Verbal, Printed Responses: State content correctly Electronic Signature(s) Signed: 11/02/2021 12:13:52 PM By: Lorrin Jackson Entered By: Lorrin Jackson on 11/02/2021 10:43:01 -------------------------------------------------------------------------------- Wound Assessment Details Patient Name: Date of Service: Holly [name], Holly  K. 11/02/2021 10:15 A M Medical Record Number: [id number] Patient Account Number: [id number] Date of Birth/Sex: Treating RN: [birth year] (13 y.Holly. Sue Lush Primary Care Suezette Lafave: Penni Homans Other Clinician: Referring Lavine Hargrove: Treating Payne Garske/Extender: Earley Abide Weeks in Treatment: 2 Wound Status Wound Number: 1 Primary Dehisced Wound Etiology: Wound Location: Back Wound Open Wounding Event: Surgical Injury Status: Date Acquired: 09/21/2021 Comorbid Chronic sinus problems/congestion,  Anemia, Asthma, Arrhythmia, Weeks Of Treatment: 2 History: Hypertension, Type II Diabetes, Gout, Rheumatoid Arthritis, Clustered Wound: No Neuropathy, Received Chemotherapy Photos Wound Measurements Length: (cm) 7.6 Width: (cm) 0.7 Depth: (cm) 0.6 Area: (cm) 4.178 Volume: (cm) 2.507 % Reduction in Area: 44.6% % Reduction in Volume: 52.5% Epithelialization: Small (1-33%) Tunneling: No Undermining: No Wound Description Classification: Full Thickness Without Exposed Support Structures Wound Margin: Well defined, not attached Exudate Amount: Medium Exudate Type: Serosanguineous Exudate Color: red, brown Foul Odor After Cleansing: No Slough/Fibrino Yes Wound Bed Granulation Amount: Large (67-100%) Exposed Structure Granulation Quality: Red, Pink Fascia Exposed: No Necrotic Amount: Small (1-33%) Fat Layer (Subcutaneous Tissue) Exposed: Yes Necrotic Quality: Adherent Slough Tendon Exposed: No Muscle Exposed: No Joint Exposed: No Bone Exposed: No Treatment Notes Wound #1 (Back) Cleanser Normal Saline Discharge Instruction: Cleanse the wound with Normal Saline prior to applying a clean dressing using gauze sponges, not tissue or cotton balls. Soap and Water Discharge Instruction: May shower and wash wound with dial antibacterial soap and water prior to dressing change. Peri-Wound Care Topical Primary Dressing Promogran Prisma Matrix, 4.34 (sq in) (silver collagen) Discharge Instruction: Moisten collagen with saline or hydrogel Secondary Dressing ABD Pad, 5x9 Discharge Instruction: Apply over primary dressing as directed. Woven Gauze Sponge, Non-Sterile 4x4 in Discharge Instruction: Apply over primary dressing as directed. Secured With 24M Medipore H Soft Cloth Surgical T ape, 4 x 10 (in/yd) Discharge Instruction: Secure with tape as directed. Compression Wrap Compression Stockings Add-Ons Electronic Signature(s) Signed: 11/02/2021 12:13:52 PM By: Lorrin Jackson Entered By: Lorrin Jackson on 11/02/2021 10:59:21 -------------------------------------------------------------------------------- Vitals Details Patient Name: Date of Service: [name]. 11/02/2021 10:15 A M Medical Record Number: [id number] Patient Account Number: [id number] Date of Birth/Sex: Treating RN: [birth year] (34 y.Holly. Sue Lush Primary Care Khian Remo: Penni Homans Other Clinician: Referring Avaleigh Decuir: Treating Shyanne Mcclary/Extender: Forde Dandy in Treatment: 2 Vital Signs Time Taken: 10:07 Temperature (F): 99.2 Height (in): 66 Pulse (bpm): 98 Weight (lbs): 255 Respiratory Rate (breaths/min): 18 Body Mass Index (BMI): 41.2 Blood Pressure (mmHg): 124/81 Reference Range: 80 - 120 mg / dl Electronic Signature(s) Signed: 11/08/2021 8:46:10 AM By: Erenest Blank Entered By: Erenest Blank on 11/02/2021 10:08:04

## 2021-11-09 ENCOUNTER — Encounter (HOSPITAL_BASED_OUTPATIENT_CLINIC_OR_DEPARTMENT_OTHER): Payer: Medicare Other | Admitting: Physician Assistant

## 2021-11-09 DIAGNOSIS — T8131XA Disruption of external operation (surgical) wound, not elsewhere classified, initial encounter: Secondary | ICD-10-CM | POA: Diagnosis not present

## 2021-11-09 DIAGNOSIS — I10 Essential (primary) hypertension: Secondary | ICD-10-CM | POA: Diagnosis not present

## 2021-11-09 DIAGNOSIS — E11622 Type 2 diabetes mellitus with other skin ulcer: Secondary | ICD-10-CM | POA: Diagnosis not present

## 2021-11-09 DIAGNOSIS — L98422 Non-pressure chronic ulcer of back with fat layer exposed: Secondary | ICD-10-CM | POA: Diagnosis not present

## 2021-11-09 NOTE — Progress Notes (Addendum)
[name], [name] ([id number]) Visit Report for 11/09/2021 Chief Complaint Document Details Patient Name: Date of Service: [name], [name] 11/09/2021 3:30 PM Medical Record Number: [id number] Patient Account Number: [id number] Date of Birth/Sex: Treating RN: April 15, 1956 (66 y.o. Sue Lush Primary Care Provider: Penni Homans Other Clinician: Referring Provider: Treating Provider/Extender: Clemmie Krill Weeks in Treatment: 3 Information Obtained from: Patient Chief Complaint Back surgical wound dehiscence Electronic Signature(s) Signed: 11/09/2021 4:14:24 PM By: Worthy Keeler PA-C Entered By: Worthy Keeler on 11/09/2021 16:14:24 -------------------------------------------------------------------------------- HPI Details Patient Name: Date of Service: [name], Holly K. 11/09/2021 3:30 PM Medical Record Number: [id number] Patient Account Number: [id number] Date of Birth/Sex: Treating RN: 1956/05/22 (66 y.o. Sue Lush Primary Care Provider: Penni Homans Other Clinician: Referring Provider: Treating Provider/Extender: Dyanne Carrel in Treatment: 3 History of Present Illness HPI Description: 10-19-2021 upon evaluation today patient appears to be doing poorly currently in regard to a wound over her back. She had an original surgery which actually was done in April 2023 this was on the 26th. Subsequently she had some issues following and this was leaking and draining she had to go back for repeat reopening of the wound in order to clean this out and subsequently the patient has not really noted significant improvement since that time. She still having a lot of pain and issues during the procedure they did do a culture from the deep portion of the wound when they cleaned this out and it showed Pseudomonas this was not treated however according to the patient. It was told to her that it was not likely representative of infection.  Nonetheless she has continued to have drainage and pain. She does have some dehiscence of the wound yet again there are sutures noted in the base of the wound and I do believe that this is causing some issue with healing to be honest we can have to clean this out and in turn we can have to loosen the stitches in order to allow for appropriate healing from the bottom out. I discussed that with her and actually removed 8 absorbable sutures which were present in the base of the wound. This allowed the wound to open a little bit more and subsequently I do believe that this is going to allow for more appropriate dressing changes in general. The patient did have some discomfort with this but overall I think it was unnecessary action in order to get this to heal appropriately. She definitely voiced understanding in that regard. Otherwise the good news is there are no real deep tunnels or undermining areas which is great news. Patient does have a history of diabetes mellitus type 2, hypertension, but really no other major medical problems. The good news is as far as the reason she went in for surgery which was her back pain and issues there everything seems to be doing much better. 10-26-2021 upon evaluation today patient appears to be doing excellent in regard to her wound. She is tolerating the dressing changes without complication. Fortunately there does not appear to be any evidence of active infection locally or systemically which is great news and overall I do believe that we are on the right track as far as healing is concerned the Iodoflex is doing a great job we may need to switch it come next week as I think that she will be ready and this does burn but nonetheless she is okay with sticking with  it for the next week. 6/7; surgical wound on her back. She had a tunnel superiorly but that close down completely today. Been using Iodoflex the granulation tissue really looks quite healthy. She is completing  antibiotics that we prescribed. Her only complaint was that the Iodoflex stings for about 5 hours 11-09-2021 upon evaluation today patient appears to be doing extremely well in regard to her wound. She has been tolerating the dressing changes without complication. I do believe that she is tolerating the collagen quite well and I see no signs of infection right now which is great news. No fevers, chills, nausea, vomiting, or diarrhea. Electronic Signature(s) Signed: 11/09/2021 4:49:29 PM By: Worthy Keeler PA-C Entered By: Worthy Keeler on 11/09/2021 16:49:29 -------------------------------------------------------------------------------- Physical Exam Details Patient Name: Date of Service: [name], [name] 11/09/2021 3:30 PM Medical Record Number: [id number] Patient Account Number: [id number] Date of Birth/Sex: Treating RN: 1956/02/12 (66 y.o. Sue Lush Primary Care Provider: Penni Homans Other Clinician: Referring Provider: Treating Provider/Extender: Clemmie Krill Weeks in Treatment: 3 Constitutional Well-nourished and well-hydrated in no acute distress. Respiratory normal breathing without difficulty. Psychiatric this patient is able to make decisions and demonstrates good insight into disease process. Alert and Oriented x 3. pleasant and cooperative. Notes Upon inspection patient's wound bed actually showed signs of good granulation and epithelization at this point. Fortunately I do not see any evidence of active infection locally or systemically which is great news and overall I am extremely pleased with where things stand currently. No fevers, chills, nausea, vomiting, or diarrhea. Electronic Signature(s) Signed: 11/09/2021 4:49:48 PM By: Worthy Keeler PA-C Entered By: Worthy Keeler on 11/09/2021 16:49:48 -------------------------------------------------------------------------------- Physician Orders Details Patient Name: Date of Service: Holly Jenetta Downer  [name], Holly K. 11/09/2021 3:30 PM Medical Record Number: [id number] Patient Account Number: [id number] Date of Birth/Sex: Treating RN: [birth year] (66 y.o. 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 - 11/16/21 @ 3:00pm am with Abigail Butts, Room 8 Return A Bathing/ Shower/ Hygiene Do not shower or bathe in tub. Additional Orders / Instructions Follow Nutritious Diet - Increase protein intake Wound Treatment Wound #1 - Back Cleanser: Normal Saline (Generic) Every Other Day/30 Days Discharge Instructions: Cleanse the wound with Normal Saline prior to applying a clean dressing using gauze sponges, not tissue or cotton balls. Cleanser: Soap and Water Every Other Day/30 Days Discharge Instructions: May shower and wash wound with dial antibacterial soap and water prior to dressing change. Prim Dressing: Promogran Prisma Matrix, 4.34 (sq in) (silver collagen) (Generic) Every Other Day/30 Days ary Discharge Instructions: Moisten collagen with saline or hydrogel Secondary Dressing: ABD Pad, 5x9 (Generic) Every Other Day/30 Days Discharge Instructions: Apply over primary dressing as directed. Secondary Dressing: Woven Gauze Sponge, Non-Sterile 4x4 in (Generic) Every Other Day/30 Days Discharge Instructions: Apply over primary dressing as directed. Secured With: 95M Medipore H Soft Cloth Surgical T ape, 4 x 10 (in/yd) (Generic) Every Other Day/30 Days Discharge Instructions: Secure with tape as  directed. Electronic Signature(s) Signed: 11/09/2021 5:11:12 PM By: Worthy Keeler PA-C Signed: 11/09/2021 5:24:59 PM By:  Deon Pilling RN, BSN Entered By: Deon Pilling on 11/09/2021 16:35:05 -------------------------------------------------------------------------------- Problem List Details Patient Name: Date of Service: [name], [name] 11/09/2021 3:30 PM Medical Record Number: [id number] Patient Account Number: [id number] Date of Birth/Sex: Treating RN: 19-Dec-1955 (66 y.o. 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 11/09/2021 3:30 PM Medical Record Number: [id number] Patient Account Number: [id number] Date of Birth/Sex: Treating RN: 1956/01/16 (66 y.o. Sue Lush Primary Care Provider: Penni Homans Other Clinician: Referring Provider: Treating Provider/Extender: Dyanne Carrel in Treatment: 3 Subjective Chief Complaint Information obtained from Patient Back surgical wound dehiscence History of Present Illness (HPI) 10-19-2021 upon evaluation today patient appears to be doing poorly currently in  regard to a wound over her back. She had an original surgery which actually was done in April 2023 this was on the 26th. Subsequently she had some issues following and this was leaking and draining she had to go back for repeat reopening of the wound in order to clean this out and subsequently the patient has not really noted significant improvement since that time. She still having a lot of pain and issues during the procedure they did do a culture from the deep portion of the wound when they cleaned this out and it showed Pseudomonas this was not treated however according to the patient. It was told to her that it was not likely representative of infection. Nonetheless she has continued to have drainage and pain. She does have some dehiscence of the wound yet again there are sutures noted in the base of the wound and I do believe that this is causing some issue with healing to be honest we can have to clean this out and in turn we can have to loosen the stitches in order to allow for appropriate healing from the bottom out. I discussed that with her and actually removed 8 absorbable sutures which were present in the base of the wound. This allowed the wound to open a little bit more and subsequently I do believe that this is going to allow for more appropriate dressing changes in general. The patient did have some discomfort with this but overall I think it was unnecessary action in order to get this to heal appropriately. She definitely voiced understanding in that regard. Otherwise the good news is there are no real deep tunnels or undermining areas which is great news. Patient does have a history of diabetes mellitus type 2, hypertension, but really no other major medical problems. The good news is as far as the reason she went in for surgery which was her back pain and issues there everything seems to be doing much better. 10-26-2021 upon evaluation today patient appears to be doing excellent in  regard to her wound. She is tolerating the dressing changes without complication. Fortunately there does not appear to be  any evidence of active infection locally or systemically which is great news and overall I do believe that we are on the right track as far as healing is concerned the Iodoflex is doing a great job we may need to switch it come next week as I think that she will be ready and this does burn but nonetheless she is okay with sticking with it for the next week. 6/7; surgical wound on her back. She had a tunnel superiorly but that close down completely today. Been using Iodoflex the granulation tissue really looks quite healthy. She is completing antibiotics that we prescribed. Her only complaint was that the Iodoflex stings for about 5 hours 11-09-2021 upon evaluation today patient appears to be doing extremely well in regard to her wound. She has been tolerating the dressing changes without complication. I do believe that she is tolerating the collagen quite well and I see no signs of infection right now which is great news. No fevers, chills, nausea, vomiting, or diarrhea. Objective Constitutional Well-nourished and well-hydrated in no acute distress. Vitals Time Taken: 4:20 PM, Height: 66 in, Weight: 255 lbs, BMI: 41.2, Temperature: 98.9 F, Pulse: 98 bpm, Respiratory Rate: 16 breaths/min, Blood Pressure: 145/85 mmHg. Respiratory normal breathing without difficulty. Psychiatric this patient is able to make decisions and demonstrates good insight into disease process. Alert and Oriented x 3. pleasant and cooperative. General Notes: Upon inspection patient's wound bed actually showed signs of good granulation and epithelization at this point. Fortunately I do not see any evidence of active infection locally or systemically which is great news and overall I am extremely pleased with where things stand currently. No fevers, chills, nausea, vomiting, or diarrhea. Integumentary  (Hair, Skin) Wound #1 status is Open. Original cause of wound was Surgical Injury. The date acquired was: 09/21/2021. The wound has been in treatment 3 weeks. The wound is located on the Back. The wound measures 7cm length x 0.6cm width x 0.3cm depth; 3.299cm^2 area and 0.99cm^3 volume. There is Fat Layer (Subcutaneous Tissue) exposed. There is no tunneling or undermining noted. There is a medium amount of serosanguineous drainage noted. The wound margin is well defined and not attached to the wound base. There is large (67-100%) red, pink granulation within the wound bed. There is no necrotic tissue within the wound bed. 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 - 11/16/21 @ 3:00pm am with Abigail Butts, Room 8 Bathing/ Shower/ Hygiene: Do not shower or bathe in tub. Additional Orders / Instructions: Follow Nutritious Diet - Increase protein intake WOUND #1: - Back Wound Laterality: Cleanser: Normal Saline (Generic) Every Other Day/30 Days Discharge Instructions: Cleanse the wound with Normal Saline prior to applying a clean dressing using gauze sponges, not tissue or cotton balls. Cleanser: Soap and Water Every Other Day/30 Days Discharge Instructions: May shower and wash wound with dial antibacterial soap and water prior to dressing change. Prim Dressing: Promogran Prisma Matrix, 4.34 (sq in) (silver collagen) (Generic) Every Other Day/30 Days ary Discharge Instructions: Moisten collagen with saline or hydrogel Secondary Dressing: ABD Pad, 5x9 (Generic) Every Other Day/30 Days Discharge Instructions: Apply over primary dressing as directed. Secondary Dressing: Woven Gauze Sponge, Non-Sterile 4x4 in  (Generic) Every Other Day/30 Days Discharge Instructions: Apply over primary dressing as directed. Secured With:  60M Medipore H Soft Cloth Surgical T ape, 4 x 10 (in/yd) (Generic) Every Other Day/30 Days Discharge Instructions: Secure with tape as directed. 1. I am going to recommend that we going continue with the silver collagen which I think is probably still doing an awesome job here. 2. I am also can recommend that we have the patient continue to monitor for any signs of infection. Obviously if anything changes she should let me know but right now I really feel like we are seeing a lot of new skin growth and I do believe that well on the right track here. We will see patient back for reevaluation in 1 week here in the clinic. If anything worsens or changes patient will contact our office for additional recommendations. Electronic Signature(s) Signed: 11/09/2021 4:50:20 PM By: Worthy Keeler PA-C Entered By: Worthy Keeler on 11/09/2021 16:50:20 -------------------------------------------------------------------------------- SuperBill Details Patient Name: Date of Service: [name], Holly K. 11/09/2021 Medical Record Number: [id number] Patient Account Number: [id number] Date of Birth/Sex: Treating RN: 12-21-1955 (66 y.o. 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 E11.622 Type 2 diabetes mellitus with other skin ulcer I10 Essential (primary) hypertension Facility Procedures CPT4 Code: [id number] Description: 99213 - WOUND CARE VISIT-LEV 3 EST PT Modifier: Quantity: 1 Physician Procedures : CPT4 Code Description Modifier [id number] - WC  PHYS LEVEL 3 - EST PT ICD-10 Diagnosis Description T81.31XA Disruption of external operation (surgical) wound, not elsewhere classified, initial encounter L98.422 Non-pressure chronic ulcer of back with  fat layer exposed E11.622 Type 2 diabetes mellitus with other skin ulcer I10 Essential (primary) hypertension Quantity: 1 Electronic Signature(s) Signed: 11/09/2021 4:52:02 PM By: Worthy Keeler PA-C Entered By: Worthy Keeler on 11/09/2021 16:52:02

## 2021-11-10 NOTE — Progress Notes (Signed)
ADEL, NEYER ([id number]) Visit Report for 11/09/2021 Arrival Information Details Patient Name: Date of Service: KEI, LANGHORST 11/09/2021 3:30 PM Medical Record Number: [id number] Patient Account Number: [id number] Date of Birth/Sex: Treating RN: 12/11/1955 (66 y.o. Helene Shoe, Meta.Reding Primary Care Aerik Polan: Penni Homans Other Clinician: Referring Kimberli Winne: Treating Mykira Hofmeister/Extender: Dyanne Carrel in Treatment: 3 Visit Information History Since Last Visit Added or deleted any medications: No Patient Arrived: Ambulatory Any new allergies or adverse reactions: No Arrival Time: 16:21 Had a fall or experienced change in No Accompanied By: self activities of daily living that may affect Transfer Assistance: None risk of falls: Patient Identification Verified: Yes Signs or symptoms of abuse/neglect since last visito No Secondary Verification Process Completed: Yes Hospitalized since last visit: No Patient Requires Transmission-Based Precautions: No Implantable device outside of the clinic excluding No Patient Has Alerts: Yes cellular tissue based products placed in the center Patient Alerts: BP Left Arm Only since last visit: Has Dressing in Place as Prescribed: Yes Pain Present Now: No Electronic Signature(s) Signed: 11/09/2021 5:24:59 PM By: Deon Pilling RN, BSN Entered By: Deon Pilling on 11/09/2021 16:21:58 -------------------------------------------------------------------------------- Clinic Level of Care Assessment Details Patient Name: Date of Service: [place] JAMESHIA, HAYASHIDA 11/09/2021 3:30 PM Medical Record Number: [id number] Patient Account Number: [id number] Date of Birth/Sex: Treating RN: 08-04-1955 (66 y.o. Helene Shoe, Meta.Reding Primary Care Johnesha Acheampong: Penni Homans Other Clinician: Referring Ellen Goris: Treating Teriana Danker/Extender: Dyanne Carrel in Treatment: 3 Clinic Level of Care Assessment Items TOOL 4 Quantity Score X-  1 0 Use when only an EandM is performed on FOLLOW-UP visit ASSESSMENTS - Nursing Assessment / Reassessment X- 1 10 Reassessment of Co-morbidities (includes updates in patient status) X- 1 5 Reassessment of Adherence to Treatment Plan ASSESSMENTS - Wound and Skin A ssessment / Reassessment X - Simple Wound Assessment / Reassessment - one wound 1 5 '[]'$  - 0 Complex Wound Assessment / Reassessment - multiple wounds X- 1 10 Dermatologic / Skin Assessment (not related to wound area) ASSESSMENTS - Focused Assessment '[]'$  - 0 Circumferential Edema Measurements - multi extremities '[]'$  - 0 Nutritional Assessment / Counseling / Intervention '[]'$  - 0 Lower Extremity Assessment (monofilament, tuning fork, pulses) '[]'$  - 0 Peripheral Arterial Disease Assessment (using hand held doppler) ASSESSMENTS - Ostomy and/or Continence Assessment and Care '[]'$  - 0 Incontinence Assessment and Management '[]'$  - 0 Ostomy Care Assessment and Management (repouching, etc.) PROCESS - Coordination of Care X - Simple Patient / Family Education for ongoing care 1 15 '[]'$  - 0 Complex (extensive) Patient / Family Education for ongoing care X- 1 10 Staff obtains Programmer, systems, Records, T Results / Process Orders est '[]'$  - 0 Staff telephones HHA, Nursing Homes / Clarify orders / etc '[]'$  - 0 Routine Transfer to another Facility (non-emergent condition) '[]'$  - 0 Routine Hospital Admission (non-emergent condition) '[]'$  - 0 New Admissions / Biomedical engineer / Ordering NPWT Apligraf, etc. , '[]'$  - 0 Emergency Hospital Admission (emergent condition) X- 1 10 Simple Discharge Coordination '[]'$  - 0 Complex (extensive) Discharge Coordination PROCESS - Special Needs '[]'$  - 0 Pediatric / Minor Patient Management '[]'$  - 0 Isolation Patient Management '[]'$  - 0 Hearing / Language / Visual special needs '[]'$  - 0 Assessment of Community assistance (transportation, D/C planning, etc.) '[]'$  - 0 Additional assistance / Altered mentation '[]'$  -  0 Support Surface(s) Assessment (bed, cushion, seat, etc.) INTERVENTIONS - Wound Cleansing / Measurement X - Simple Wound Cleansing - one wound 1  5 '[]'$  - 0 Complex Wound Cleansing - multiple wounds X- 1 5 Wound Imaging (photographs - any number of wounds) '[]'$  - 0 Wound Tracing (instead of photographs) X- 1 5 Simple Wound Measurement - one wound '[]'$  - 0 Complex Wound Measurement - multiple wounds INTERVENTIONS - Wound Dressings X - Small Wound Dressing one or multiple wounds 1 10 '[]'$  - 0 Medium Wound Dressing one or multiple wounds '[]'$  - 0 Large Wound Dressing one or multiple wounds '[]'$  - 0 Application of Medications - topical '[]'$  - 0 Application of Medications - injection INTERVENTIONS - Miscellaneous '[]'$  - 0 External ear exam '[]'$  - 0 Specimen Collection (cultures, biopsies, blood, body fluids, etc.) '[]'$  - 0 Specimen(s) / Culture(s) sent or taken to Lab for analysis '[]'$  - 0 Patient Transfer (multiple staff / Civil Service fast streamer / Similar devices) '[]'$  - 0 Simple Staple / Suture removal (25 or less) '[]'$  - 0 Complex Staple / Suture removal (26 or more) '[]'$  - 0 Hypo / Hyperglycemic Management (close monitor of Blood Glucose) '[]'$  - 0 Ankle / Brachial Index (ABI) - do not check if billed separately X- 1 5 Vital Signs Has the patient been seen at the hospital within the last three years: Yes Total Score: 95 Level Of Care: New/Established - Level 3 Electronic Signature(s) Signed: 11/09/2021 5:24:59 PM By: Deon Pilling RN, BSN Entered By: Deon Pilling on 11/09/2021 16:38:55 -------------------------------------------------------------------------------- Encounter Discharge Information Details Patient Name: Date of Service: MO [name] 11/09/2021 3:30 PM Medical Record Number: [id number] Patient Account Number: [id number] Date of Birth/Sex: Treating RN: 11/20/1955 (66 y.o. Debby Bud Primary Care Roczen Waymire: Penni Homans Other Clinician: Referring Mattias Walmsley: Treating  Dyneshia Baccam/Extender: Dyanne Carrel in Treatment: 3 Encounter Discharge Information Items Discharge Condition: Stable Ambulatory Status: Ambulatory Discharge Destination: Home Transportation: Private Auto Accompanied By: self Schedule Follow-up Appointment: Yes Clinical Summary of Care: Electronic Signature(s) Signed: 11/09/2021 5:24:59 PM By: Deon Pilling RN, BSN Entered By: Deon Pilling on 11/09/2021 16:39:22 -------------------------------------------------------------------------------- Lower Extremity Assessment Details Patient Name: Date of Service: MO Jenetta Downer RE, SOMA BACH[place]. 11/09/2021 3:30 PM Medical Record Number: [id number] Patient Account Number: [id number] Date of Birth/Sex: Treating RN: 1956/05/14 (66 y.o. Debby Bud Primary Care Cristin Szatkowski: Penni Homans Other Clinician: Referring Shenicka Sunderlin: Treating Carlesha Seiple/Extender: Clemmie Krill Weeks in Treatment: 3 Electronic Signature(s) Signed: 11/09/2021 5:24:59 PM By: Deon Pilling RN, BSN Entered By: Deon Pilling on 11/09/2021 16:22:24 -------------------------------------------------------------------------------- [place] Details Patient Name: Date of Service: [place], MACK THURMON. 11/09/2021 3:30 PM Medical Record Number: [id number] Patient Account Number: [id number] Date of Birth/Sex: Treating RN: 06/02/1955 (66 y.o. Helene Shoe, Tammi Klippel Primary Care Emilyanne Mcgough: Penni Homans Other Clinician: Referring Keron Koffman: Treating Tyner Codner/Extender: Clemmie Krill Weeks in Treatment: 3 Active Inactive Wound/Skin Impairment Nursing Diagnoses: Impaired tissue integrity Goals: Patient/caregiver will verbalize understanding of skin care regimen Date Initiated: 10/19/2021 Target Resolution Date: 11/16/2021 Goal Status: Active Ulcer/skin breakdown will have a volume reduction of 30% by week 4 Date Initiated: 10/19/2021 Target Resolution Date: 11/16/2021 Goal Status:  Active Interventions: Assess patient/caregiver ability to obtain necessary supplies Assess patient/caregiver ability to perform ulcer/skin care regimen upon admission and as needed Assess ulceration(s) every visit Provide education on ulcer and skin care Treatment Activities: Topical wound management initiated : 10/19/2021 Notes: Electronic Signature(s) Signed: 11/09/2021 5:24:59 PM By: Deon Pilling RN, BSN Entered By: Deon Pilling on 11/09/2021 16:27:30 -------------------------------------------------------------------------------- Pain Assessment Details Patient Name: Date of Service: MO O RE, [place] K. 11/09/2021  3:30 PM Medical Record Number: [id number] Patient Account Number: [id number] Date of Birth/Sex: Treating RN: 1955-06-08 (66 y.o. Debby Bud Primary Care Garnetta Fedrick: Penni Homans Other Clinician: Referring Laurissa Cowper: Treating Erasmus Bistline/Extender: Clemmie Krill Weeks in Treatment: 3 Active Problems Location of Pain Severity and Description of Pain Patient Has Paino No Site Locations Rate the pain. Rate the pain. Current Pain Level: 0 Pain Management and Medication Current Pain Management: Medication: No Cold Application: No Rest: No Massage: No Activity: No T.E.N.S.: No Heat Application: No Leg drop or elevation: No Is the Current Pain Management Adequate: Adequate How does your wound impact your activities of daily livingo Sleep: No Bathing: No Appetite: No Relationship With Others: No Bladder Continence: No Emotions: No Bowel Continence: No Work: No Toileting: No Drive: No Dressing: No Hobbies: No Engineer, maintenance) Signed: 11/09/2021 5:24:59 PM By: Deon Pilling RN, BSN Entered By: Deon Pilling on 11/09/2021 16:22:19 -------------------------------------------------------------------------------- Patient/Caregiver Education Details Patient Name: Date of Service: MO Cyndi Lennert 6/14/2023andnbsp3:30 PM Medical  Record Number: [id number] Patient Account Number: [id number] Date of Birth/Gender: Treating RN: 1956-02-25 (66 y.o. Debby Bud Primary Care Physician: Penni Homans Other Clinician: Referring Physician: Treating Physician/Extender: Dyanne Carrel in Treatment: 3 Education Assessment Education Provided To: Patient Education Topics Provided Wound/Skin Impairment: Handouts: Skin Care Do's and Dont's Methods: Explain/Verbal Responses: Reinforcements needed Electronic Signature(s) Signed: 11/09/2021 5:24:59 PM By: Deon Pilling RN, BSN Entered By: Deon Pilling on 11/09/2021 16:27:40 -------------------------------------------------------------------------------- Wound Assessment Details Patient Name: Date of Service: MO Cyndi Lennert. 11/09/2021 3:30 PM Medical Record Number: [id number] Patient Account Number: [id number] Date of Birth/Sex: Treating RN: December 02, 1955 (66 y.o. Tonita Phoenix, Lauren Primary Care Aeric Burnham: Penni Homans Other Clinician: Referring Kanaya Gunnarson: Treating Shima Compere/Extender: Clemmie Krill Weeks in Treatment: 3 Wound Status Wound Number: 1 Primary Dehisced Wound Etiology: Wound Location: Back Wound Open Wounding Event: Surgical Injury Status: Date Acquired: 09/21/2021 Comorbid Chronic sinus problems/congestion, Anemia, Asthma, Arrhythmia, Weeks Of Treatment: 3 History: Hypertension, Type II Diabetes, Gout, Rheumatoid Arthritis, Clustered Wound: No Neuropathy, Received Chemotherapy Photos Wound Measurements Length: (cm) 7 Width: (cm) 0.6 Depth: (cm) 0.3 Area: (cm) 3.299 Volume: (cm) 0.99 % Reduction in Area: 56.2% % Reduction in Volume: 81.2% Epithelialization: Medium (34-66%) Tunneling: No Undermining: No Wound Description Classification: Full Thickness Without Exposed Support Structures Wound Margin: Well defined, not attached Exudate Amount: Medium Exudate Type: Serosanguineous Exudate Color: red,  brown Foul Odor After Cleansing: No Slough/Fibrino No Wound Bed Granulation Amount: Large (67-100%) Exposed Structure Granulation Quality: Red, Pink Fascia Exposed: No Necrotic Amount: None Present (0%) Fat Layer (Subcutaneous Tissue) Exposed: Yes Tendon Exposed: No Muscle Exposed: No Joint Exposed: No Bone Exposed: No Treatment Notes Wound #1 (Back) Cleanser Normal Saline Discharge Instruction: Cleanse the wound with Normal Saline prior to applying a clean dressing using gauze sponges, not tissue or cotton balls. Soap and Water Discharge Instruction: May shower and wash wound with dial antibacterial soap and water prior to dressing change. Peri-Wound Care Topical Primary Dressing Promogran Prisma Matrix, 4.34 (sq in) (silver collagen) Discharge Instruction: Moisten collagen with saline or hydrogel Secondary Dressing ABD Pad, 5x9 Discharge Instruction: Apply over primary dressing as directed. Woven Gauze Sponge, Non-Sterile 4x4 in Discharge Instruction: Apply over primary dressing as directed. Secured With 63M Medipore H Soft Cloth Surgical T ape, 4 x 10 (in/yd) Discharge Instruction: Secure with tape as directed. Compression Wrap Compression Stockings Add-Ons Electronic Signature(s) Signed: 11/10/2021 5:29:07 PM By: Rhae Hammock RN Entered By: Hollie Salk  Lauren on 11/09/2021 16:25:05 -------------------------------------------------------------------------------- Vitals Details Patient Name: Date of Service: SHEVETTE, BESS 11/09/2021 3:30 PM Medical Record Number: [id number] Patient Account Number: [id number] Date of Birth/Sex: Treating RN: 1956-02-14 (66 y.o. Helene Shoe, Tammi Klippel Primary Care Tytionna Cloyd: Penni Homans Other Clinician: Referring Lexus Barletta: Treating Mervyn Pflaum/Extender: Clemmie Krill Weeks in Treatment: 3 Vital Signs Time Taken: 16:20 Temperature (F): 98.9 Height (in): 66 Pulse (bpm): 98 Weight (lbs): 255 Respiratory Rate  (breaths/min): 16 Body Mass Index (BMI): 41.2 Blood Pressure (mmHg): 145/85 Reference Range: 80 - 120 mg / dl Electronic Signature(s) Signed: 11/09/2021 5:24:59 PM By: Deon Pilling RN, BSN Entered By: Deon Pilling on 11/09/2021 16:22:12

## 2021-11-16 ENCOUNTER — Encounter (HOSPITAL_BASED_OUTPATIENT_CLINIC_OR_DEPARTMENT_OTHER): Payer: Medicare Other | Admitting: Physician Assistant

## 2021-11-16 ENCOUNTER — Other Ambulatory Visit: Payer: Self-pay

## 2021-11-16 DIAGNOSIS — I10 Essential (primary) hypertension: Secondary | ICD-10-CM

## 2021-11-16 DIAGNOSIS — R5383 Other fatigue: Secondary | ICD-10-CM

## 2021-11-16 DIAGNOSIS — E782 Mixed hyperlipidemia: Secondary | ICD-10-CM

## 2021-11-16 DIAGNOSIS — E11622 Type 2 diabetes mellitus with other skin ulcer: Secondary | ICD-10-CM | POA: Diagnosis not present

## 2021-11-16 DIAGNOSIS — E559 Vitamin D deficiency, unspecified: Secondary | ICD-10-CM

## 2021-11-16 DIAGNOSIS — E1140 Type 2 diabetes mellitus with diabetic neuropathy, unspecified: Secondary | ICD-10-CM

## 2021-11-16 DIAGNOSIS — T8131XA Disruption of external operation (surgical) wound, not elsewhere classified, initial encounter: Secondary | ICD-10-CM | POA: Diagnosis not present

## 2021-11-16 DIAGNOSIS — L98422 Non-pressure chronic ulcer of back with fat layer exposed: Secondary | ICD-10-CM | POA: Diagnosis not present

## 2021-11-16 NOTE — Progress Notes (Addendum)
JAKERRIA, KINGBIRD ([id number]) Visit Report for 11/16/2021 Chief Complaint Document Details Patient Name: Date of Service: [name], [name] 11/16/2021 3:00 PM Medical Record Number: [id number] Patient Account Number: [id number] Date of Birth/Sex: Treating RN: [birth year] (66 y.o. Debby Bud Primary Care Provider: Penni Homans Other Clinician: Referring Provider: Treating Provider/Extender: Dyanne Carrel in Treatment: 4 Information Obtained from: Patient Chief Complaint Back surgical wound dehiscence Electronic Signature(s) Signed: 11/16/2021 3:01:50 PM By: Worthy Keeler PA-C Entered By: Worthy Keeler on 11/16/2021 15:01:50 -------------------------------------------------------------------------------- HPI Details Patient Name: Date of Service: [name], Holly K. 11/16/2021 3:00 PM Medical Record Number: [id number] Patient Account Number: [id number] Date of Birth/Sex: Treating RN: Feb 09, 1956 (66 y.o. Debby Bud Primary Care Provider: Penni Homans Other Clinician: Referring Provider: Treating Provider/Extender: Dyanne Carrel in Treatment: 4 History of Present Illness HPI Description: 10-19-2021 upon evaluation today patient appears to be doing poorly currently in regard to a wound over her back. She had an original surgery which actually was done in April 2023 this was on the 26th. Subsequently she had some issues following and this was leaking and draining she had to go back for repeat reopening of the wound in order to clean this out and subsequently the patient has not really noted significant improvement since that time. She still having a lot of pain and issues during the procedure they did do a culture from the deep portion of the wound when they cleaned this out and it showed Pseudomonas this was not treated however according to the patient. It was told to her that it was not likely representative of infection.  Nonetheless she has continued to have drainage and pain. She does have some dehiscence of the wound yet again there are sutures noted in the base of the wound and I do believe that this is causing some issue with healing to be honest we can have to clean this out and in turn we can have to loosen the stitches in order to allow for appropriate healing from the bottom out. I discussed that with her and actually removed 8 absorbable sutures which were present in the base of the wound. This allowed the wound to open a little bit more and subsequently I do believe that this is going to allow for more appropriate dressing changes in general. The patient did have some discomfort with this but overall I think it was unnecessary action in order to get this to heal appropriately. She definitely voiced understanding in that regard. Otherwise the good news is there are no real deep tunnels or undermining areas which is great news. Patient does have a history of diabetes mellitus type 2, hypertension, but really no other major medical problems. The good news is as far as the reason she went in for surgery which was her back pain and issues there everything seems to be doing much better. 10-26-2021 upon evaluation today patient appears to be doing excellent in regard to her wound. She is tolerating the dressing changes without complication. Fortunately there does not appear to be any evidence of active infection locally or systemically which is great news and overall I do believe that we are on the right track as far as healing is concerned the Iodoflex is doing a great job we may need to switch it come next week as I think that she will be ready and this does burn but nonetheless she is okay with sticking with  it for the next week. 6/7; surgical wound on her back. She had a tunnel superiorly but that close down completely today. Been using Iodoflex the granulation tissue really looks quite healthy. She is completing  antibiotics that we prescribed. Her only complaint was that the Iodoflex stings for about 5 hours 11-09-2021 upon evaluation today patient appears to be doing extremely well in regard to her wound. She has been tolerating the dressing changes without complication. I do believe that she is tolerating the collagen quite well and I see no signs of infection right now which is great news. No fevers, chills, nausea, vomiting, or diarrhea. 11-16-2021 upon evaluation today patient appears to be doing well with regard to her wound in fact this is significantly improved compared to what we previously seen. Fortunately I do not see any evidence of active infection locally or systemically at this time which is great news and overall I am extremely pleased with where we stand currently. No fevers, chills, nausea, vomiting, or diarrhea. Electronic Signature(s) Signed: 11/16/2021 4:43:46 PM By: Worthy Keeler PA-C Entered By: Worthy Keeler on 11/16/2021 16:43:45 -------------------------------------------------------------------------------- Physical Exam Details Patient Name: Date of Service: [name], [name] 11/16/2021 3:00 PM Medical Record Number: [id number] Patient Account Number: [id number] Date of Birth/Sex: Treating RN: July 24, 1955 (66 y.o. Debby Bud Primary Care Provider: Penni Homans Other Clinician: Referring Provider: Treating Provider/Extender: Clemmie Krill Weeks in Treatment: 4 Constitutional Well-nourished and well-hydrated in no acute distress. Respiratory normal breathing without difficulty. Psychiatric this patient is able to make decisions and demonstrates good insight into disease process. Alert and Oriented x 3. pleasant and cooperative. Notes Upon inspection patient's wound bed showed evidence of good granulation and epithelization at this point. Fortunately I do not see any evidence of infection locally or systemically which is great news and overall I  am extremely pleased with where we stand at this point. I do believe that the patient is making excellent progress and are very close to complete closure to be honest. Electronic Signature(s) Signed: 11/16/2021 4:43:57 PM By: Worthy Keeler PA-C Entered By: Worthy Keeler on 11/16/2021 16:43:57 -------------------------------------------------------------------------------- Physician Orders Details Patient Name: Date of Service: [name], Holly K. 11/16/2021 3:00 PM Medical Record Number: [id number] Patient Account Number: [id number] Date of Birth/Sex: Treating RN: 09-May-1956 (66 y.o. 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 - 11/23/21 @ 1235pm am with Abigail Butts, Room 8 Return A Bathing/ Shower/ Hygiene Do not shower or bathe in tub. Additional Orders / Instructions Follow Nutritious Diet - Increase protein intake Wound Treatment Wound #1 - Back Cleanser: Normal Saline (Generic) Every Other Day/30 Days Discharge Instructions: Cleanse the wound with Normal Saline prior to applying a clean dressing using gauze sponges, not tissue or cotton balls. Cleanser: Soap and Water Every Other Day/30 Days Discharge Instructions: May shower and wash wound with dial antibacterial soap and water prior to dressing change. Prim Dressing: Promogran Prisma Matrix, 4.34 (sq in) (silver collagen) (Generic) Every Other Day/30 Days ary Discharge Instructions: Moisten collagen with saline or  hydrogel Secondary Dressing: ABD  Pad, 5x9 (Generic) Every Other Day/30 Days Discharge Instructions: Apply over primary dressing as directed. Secondary Dressing: Woven Gauze Sponge, Non-Sterile 4x4 in (Generic) Every Other Day/30 Days Discharge Instructions: Apply over primary dressing as directed. Secured With: 106M Medipore H Soft Cloth Surgical T ape, 4 x 10 (in/yd) (Generic) Every Other Day/30 Days Discharge Instructions: Secure with tape as directed. Electronic Signature(s) Signed: 11/16/2021 4:19:27 PM By: Deon Pilling RN, BSN Signed: 11/16/2021 5:09:34 PM By: Worthy Keeler PA-C Entered By: Deon Pilling on 11/16/2021 15:54:58 -------------------------------------------------------------------------------- Problem List Details Patient Name: Date of Service: [name], Holly K. 11/16/2021 3:00 PM Medical Record Number: [id number] Patient Account Number: [id number] Date of Birth/Sex: Treating RN: 09-18-1955 (66 y.o. 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 11/16/2021 3:00 PM Medical Record Number: [id number] Patient Account Number:  [id number] Date of Birth/Sex: Treating RN: 01-12-1956 (66 y.o. Debby Bud Primary Care Provider: Penni Homans Other Clinician: Referring Provider: Treating Provider/Extender: Dyanne Carrel in Treatment: 4 Subjective Chief Complaint Information obtained from Patient Back surgical wound dehiscence History of Present Illness (HPI) 10-19-2021 upon evaluation today patient appears to be doing poorly currently in regard to a wound over her back. She had an original surgery which actually was done in April 2023 this was on the 26th. Subsequently she had some issues following and this was leaking and draining she had to go back for repeat reopening of the wound in order to clean this out and subsequently the patient has not really noted significant improvement since that time. She still having a lot of pain and issues during the procedure they did do a culture from the deep portion of the wound when they cleaned this out and it showed Pseudomonas this was not treated however according to the patient. It was told to her that it was not likely representative of infection. Nonetheless she has continued to have drainage and pain. She does have some dehiscence of the wound yet again there are sutures noted in the base of the wound and I do believe that this is causing some issue with healing to be honest we can have to clean this out and in turn we can have to loosen the stitches in order to allow for appropriate healing from the bottom out. I discussed that with her and actually removed 8 absorbable sutures which were present in the base of the wound. This allowed the wound to open a little bit more and subsequently I do believe that this is going to allow for more appropriate dressing changes in general. The patient did have some discomfort with this but overall I think it was unnecessary action in order to get this to heal appropriately. She definitely voiced understanding in  that regard. Otherwise the good news is there are no real deep tunnels or undermining areas which is great  news. Patient does have a history of diabetes mellitus type 2, hypertension, but really no other major medical problems. The good news is as far as the reason she went in for surgery which was her back pain and issues there everything seems to be doing much better. 10-26-2021 upon evaluation today patient appears to be doing excellent in regard to her wound. She is tolerating the dressing changes without complication. Fortunately there does not appear to be any evidence of active infection locally or systemically which is great news and overall I do believe that we are on the right track as far as healing is concerned the Iodoflex is doing a great job we may need to switch it come next week as I think that she will be ready and this does burn but nonetheless she is okay with sticking with it for the next week. 6/7; surgical wound on her back. She had a tunnel superiorly but that close down completely today. Been using Iodoflex the granulation tissue really looks quite healthy. She is completing antibiotics that we prescribed. Her only complaint was that the Iodoflex stings for about 5 hours 11-09-2021 upon evaluation today patient appears to be doing extremely well in regard to her wound. She has been tolerating the dressing changes without complication. I do believe that she is tolerating the collagen quite well and I see no signs of infection right now which is great news. No fevers, chills, nausea, vomiting, or diarrhea. 11-16-2021 upon evaluation today patient appears to be doing well with regard to her wound in fact this is significantly improved compared to what we previously seen. Fortunately I do not see any evidence of active infection locally or systemically at this time which is great news and overall I am extremely pleased with where we stand currently. No fevers, chills, nausea,  vomiting, or diarrhea. Objective Constitutional Well-nourished and well-hydrated in no acute distress. Vitals Time Taken: 3:30 PM, Height: 66 in, Weight: 255 lbs, BMI: 41.2, Temperature: 99 F, Pulse: 89 bpm, Respiratory Rate: 17 breaths/min, Blood Pressure: 139/80 mmHg. Respiratory normal breathing without difficulty. Psychiatric this patient is able to make decisions and demonstrates good insight into disease process. Alert and Oriented x 3. pleasant and cooperative. General Notes: Upon inspection patient's wound bed showed evidence of good granulation and epithelization at this point. Fortunately I do not see any evidence of infection locally or systemically which is great news and overall I am extremely pleased with where we stand at this point. I do believe that the patient is making excellent progress and are very close to complete closure to be honest. Integumentary (Hair, Skin) Wound #1 status is Open. Original cause of wound was Surgical Injury. The date acquired was: 09/21/2021. The wound has been in treatment 4 weeks. The wound is located on the Back. The wound measures 2.4cm length x 0.4cm width x 0.3cm depth; 0.754cm^2 area and 0.226cm^3 volume. There is Fat Layer (Subcutaneous Tissue) exposed. There is no tunneling or undermining noted. There is a medium amount of serosanguineous drainage noted. The wound margin is well defined and not attached to the wound base. There is large (67-100%) red, pink granulation within the wound bed. There is no necrotic tissue within the wound bed. 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 - 11/23/21 @  1235pm am with Abigail Butts, Room 8 Bathing/ Shower/  Hygiene: Do not shower or bathe in tub. Additional Orders / Instructions: Follow Nutritious Diet - Increase protein intake WOUND #1: - Back Wound Laterality: Cleanser: Normal Saline (Generic) Every Other Day/30 Days Discharge Instructions: Cleanse the wound with Normal Saline prior to applying a clean dressing using gauze sponges, not tissue or cotton balls. Cleanser: Soap and Water Every Other Day/30 Days Discharge Instructions: May shower and wash wound with dial antibacterial soap and water prior to dressing change. Prim Dressing: Promogran Prisma Matrix, 4.34 (sq in) (silver collagen) (Generic) Every Other Day/30 Days ary Discharge Instructions: Moisten collagen with saline or hydrogel Secondary Dressing: ABD Pad, 5x9 (Generic) Every Other Day/30 Days Discharge Instructions: Apply over primary dressing as directed. Secondary Dressing: Woven Gauze Sponge, Non-Sterile 4x4 in (Generic) Every Other Day/30 Days Discharge Instructions: Apply over primary dressing as directed. Secured With: 16M Medipore H Soft Cloth Surgical T ape, 4 x 10 (in/yd) (Generic) Every Other Day/30 Days Discharge Instructions: Secure with tape as directed. 1. I would recommend that we go ahead and continue with the silver collagen as I feel that is doing an excellent job. 2. I am also can recommend that we have the patient continue to monitor for any signs of infection obviously if anything changes she should let me know but honestly I think were close to getting this completely closed. We will see patient back for reevaluation in 1 week here in the clinic. If anything worsens or changes patient will contact our office for additional recommendations. Electronic Signature(s) Signed: 11/16/2021 4:44:14 PM By: Worthy Keeler PA-C Entered By: Worthy Keeler on 11/16/2021 16:44:13 -------------------------------------------------------------------------------- SuperBill Details Patient Name: Date of Service: [name],  [name] 11/16/2021 Medical Record Number: [id number] Patient Account Number: [id number] Date of Birth/Sex: Treating RN: 12/09/1955 (65 y.o. 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 E11.622 Type 2 diabetes mellitus with other skin ulcer I10 Essential (primary) hypertension Facility Procedures CPT4 Code: [id number] Description: 9213 - WOUND CARE VISIT-LEV 3 EST PT Modifier: Quantity: 1 Physician Procedures : CPT4 Code Description Modifier [id number] - WC PHYS LEVEL 3 - EST PT ICD-10 Diagnosis Description T81.31XA Disruption of external operation (surgical) wound, not elsewhere classified, initial encounter L98.422 Non-pressure chronic ulcer of back with  fat layer exposed E11.622 Type 2 diabetes mellitus with other skin ulcer I10 Essential (primary) hypertension Quantity: 1 Electronic Signature(s) Signed: 11/16/2021 4:44:30 PM By: Worthy Keeler PA-C Previous Signature: 11/16/2021 4:19:27 PM Version By: Deon Pilling RN, BSN Entered By: Worthy Keeler on 11/16/2021 16:44:29

## 2021-11-17 ENCOUNTER — Other Ambulatory Visit (INDEPENDENT_AMBULATORY_CARE_PROVIDER_SITE_OTHER): Payer: Medicare Other

## 2021-11-17 DIAGNOSIS — E782 Mixed hyperlipidemia: Secondary | ICD-10-CM

## 2021-11-17 DIAGNOSIS — I10 Essential (primary) hypertension: Secondary | ICD-10-CM

## 2021-11-17 DIAGNOSIS — R5383 Other fatigue: Secondary | ICD-10-CM | POA: Diagnosis not present

## 2021-11-17 DIAGNOSIS — E1140 Type 2 diabetes mellitus with diabetic neuropathy, unspecified: Secondary | ICD-10-CM | POA: Diagnosis not present

## 2021-11-17 DIAGNOSIS — E559 Vitamin D deficiency, unspecified: Secondary | ICD-10-CM

## 2021-11-17 LAB — COMPREHENSIVE METABOLIC PANEL
ALT: 10 U/L (ref 0–35)
AST: 12 U/L (ref 0–37)
Albumin: 3.8 g/dL (ref 3.5–5.2)
Alkaline Phosphatase: 90 U/L (ref 39–117)
BUN: 21 mg/dL (ref 6–23)
CO2: 30 mEq/L (ref 19–32)
Calcium: 9 mg/dL (ref 8.4–10.5)
Chloride: 104 mEq/L (ref 96–112)
Creatinine, Ser: 0.9 mg/dL (ref 0.40–1.20)
GFR: 66.24 mL/min (ref >60.00)
Glucose, Bld: 112 mg/dL — ABNORMAL HIGH (ref 70–99)
Potassium: 4.5 mEq/L (ref 3.5–5.1)
Sodium: 139 mEq/L (ref 135–145)
Total Bilirubin: 0.3 mg/dL (ref 0.2–1.2)
Total Protein: 6.2 g/dL (ref 6.0–8.3)

## 2021-11-17 LAB — LIPID PANEL
Cholesterol: 160 mg/dL (ref 0–200)
HDL: 59.5 mg/dL (ref >39.00)
LDL Cholesterol: 87 mg/dL (ref 0–99)
NonHDL: 100.14
Total CHOL/HDL Ratio: 3
Triglycerides: 65 mg/dL (ref 0.0–149.0)
VLDL: 13 mg/dL (ref 0.0–40.0)

## 2021-11-17 LAB — CBC WITH DIFFERENTIAL/PLATELET
Basophils Absolute: 0.1 10*3/uL (ref 0.0–0.1)
Basophils Relative: 0.8 % (ref 0.0–3.0)
Eosinophils Absolute: 0.3 10*3/uL (ref 0.0–0.7)
Eosinophils Relative: 4.3 % (ref 0.0–5.0)
HCT: 34.2 % — ABNORMAL LOW (ref 36.0–46.0)
Hemoglobin: 10.8 g/dL — ABNORMAL LOW (ref 12.0–15.0)
Lymphocytes Relative: 29.7 % (ref 12.0–46.0)
Lymphs Abs: 2.3 10*3/uL (ref 0.7–4.0)
MCHC: 31.6 g/dL (ref 30.0–36.0)
MCV: 85.3 fl (ref 78.0–100.0)
Monocytes Absolute: 0.9 10*3/uL (ref 0.1–1.0)
Monocytes Relative: 11.7 % (ref 3.0–12.0)
Neutro Abs: 4.1 10*3/uL (ref 1.4–7.7)
Neutrophils Relative %: 53.5 % (ref 43.0–77.0)
Platelets: 240 10*3/uL (ref 150.0–400.0)
RBC: 4.01 Mil/uL (ref 3.87–5.11)
RDW: 16.3 % — ABNORMAL HIGH (ref 11.5–15.5)
WBC: 7.6 10*3/uL (ref 4.0–10.5)

## 2021-11-17 LAB — HEMOGLOBIN A1C: Hgb A1c MFr Bld: 6 % (ref 4.6–6.5)

## 2021-11-17 LAB — TSH: TSH: 0.79 u[IU]/mL (ref 0.35–5.50)

## 2021-11-17 LAB — VITAMIN D 25 HYDROXY (VIT D DEFICIENCY, FRACTURES): VITD: 68.27 ng/mL (ref 30.00–100.00)

## 2021-11-21 ENCOUNTER — Ambulatory Visit (INDEPENDENT_AMBULATORY_CARE_PROVIDER_SITE_OTHER): Payer: Medicare Other | Admitting: Family

## 2021-11-21 ENCOUNTER — Telehealth: Payer: Self-pay | Admitting: Family

## 2021-11-21 VITALS — BP 138/67 | HR 94 | Temp 99.0°F | Resp 18 | Wt 284.0 lb

## 2021-11-21 DIAGNOSIS — D649 Anemia, unspecified: Secondary | ICD-10-CM

## 2021-11-21 DIAGNOSIS — Z01419 Encounter for gynecological examination (general) (routine) without abnormal findings: Secondary | ICD-10-CM | POA: Diagnosis not present

## 2021-11-21 DIAGNOSIS — G4733 Obstructive sleep apnea (adult) (pediatric): Secondary | ICD-10-CM | POA: Diagnosis not present

## 2021-11-21 LAB — FOLATE: Folate: 22.8 ng/mL (ref >5.9)

## 2021-11-21 LAB — FERRITIN: Ferritin: 18.9 ng/mL (ref 10.0–291.0)

## 2021-11-21 LAB — VITAMIN B12: Vitamin B-12: 247 pg/mL (ref 211–911)

## 2021-11-21 MED ORDER — IRON (FERROUS SULFATE) 325 (65 FE) MG PO TABS: 325.0000 mg | ORAL_TABLET | ORAL | Status: DC

## 2021-11-21 MED ORDER — B-12 1000 MCG PO CAPS: 1.0000 | ORAL_CAPSULE | Freq: Every day | ORAL | Status: AC

## 2021-11-21 NOTE — Telephone Encounter (Signed)
Patient advised of results and provider's comments. She will bring IFOB sometime this week.

## 2021-11-23 ENCOUNTER — Encounter (HOSPITAL_BASED_OUTPATIENT_CLINIC_OR_DEPARTMENT_OTHER): Payer: Medicare Other | Admitting: Physician Assistant

## 2021-11-23 DIAGNOSIS — T8131XA Disruption of external operation (surgical) wound, not elsewhere classified, initial encounter: Secondary | ICD-10-CM | POA: Diagnosis not present

## 2021-11-23 DIAGNOSIS — I10 Essential (primary) hypertension: Secondary | ICD-10-CM | POA: Diagnosis not present

## 2021-11-23 DIAGNOSIS — L98422 Non-pressure chronic ulcer of back with fat layer exposed: Secondary | ICD-10-CM | POA: Diagnosis not present

## 2021-11-23 DIAGNOSIS — E11622 Type 2 diabetes mellitus with other skin ulcer: Secondary | ICD-10-CM | POA: Diagnosis not present

## 2021-11-24 DIAGNOSIS — I10 Essential (primary) hypertension: Secondary | ICD-10-CM | POA: Diagnosis not present

## 2021-11-24 DIAGNOSIS — G4733 Obstructive sleep apnea (adult) (pediatric): Secondary | ICD-10-CM | POA: Diagnosis not present

## 2021-11-24 DIAGNOSIS — F32A Depression, unspecified: Secondary | ICD-10-CM | POA: Diagnosis not present

## 2021-11-29 ENCOUNTER — Other Ambulatory Visit: Payer: Self-pay | Admitting: Family Medicine

## 2021-11-29 DIAGNOSIS — F3289 Other specified depressive episodes: Secondary | ICD-10-CM

## 2021-12-01 ENCOUNTER — Other Ambulatory Visit: Payer: Self-pay | Admitting: Family Medicine

## 2021-12-15 ENCOUNTER — Ambulatory Visit: Payer: Medicare Other | Admitting: Family Medicine

## 2021-12-20 ENCOUNTER — Other Ambulatory Visit: Payer: Self-pay | Admitting: Neurosurgery

## 2021-12-20 ENCOUNTER — Ambulatory Visit (INDEPENDENT_AMBULATORY_CARE_PROVIDER_SITE_OTHER): Payer: Medicare Other

## 2021-12-20 ENCOUNTER — Other Ambulatory Visit: Payer: Self-pay | Admitting: Family Medicine

## 2021-12-20 DIAGNOSIS — M4316 Spondylolisthesis, lumbar region: Secondary | ICD-10-CM | POA: Diagnosis not present

## 2021-12-20 DIAGNOSIS — M47816 Spondylosis without myelopathy or radiculopathy, lumbar region: Secondary | ICD-10-CM | POA: Diagnosis not present

## 2021-12-20 DIAGNOSIS — M47817 Spondylosis without myelopathy or radiculopathy, lumbosacral region: Secondary | ICD-10-CM | POA: Diagnosis not present

## 2021-12-20 DIAGNOSIS — M4319 Spondylolisthesis, multiple sites in spine: Secondary | ICD-10-CM | POA: Diagnosis not present

## 2021-12-20 DIAGNOSIS — M4326 Fusion of spine, lumbar region: Secondary | ICD-10-CM | POA: Diagnosis not present

## 2021-12-21 ENCOUNTER — Other Ambulatory Visit: Payer: Self-pay | Admitting: Neurosurgery

## 2021-12-21 DIAGNOSIS — M4316 Spondylolisthesis, lumbar region: Secondary | ICD-10-CM

## 2021-12-22 DIAGNOSIS — M1711 Unilateral primary osteoarthritis, right knee: Secondary | ICD-10-CM | POA: Diagnosis not present

## 2021-12-23 ENCOUNTER — Ambulatory Visit
Admission: RE | Admit: 2021-12-23 | Discharge: 2021-12-23 | Disposition: A | Discharge status: Home / Self Care | Payer: Medicare Other | Source: Ambulatory Visit | Attending: Neurosurgery | Admitting: Neurosurgery

## 2021-12-23 DIAGNOSIS — M47817 Spondylosis without myelopathy or radiculopathy, lumbosacral region: Secondary | ICD-10-CM | POA: Diagnosis not present

## 2021-12-23 DIAGNOSIS — M48061 Spinal stenosis, lumbar region without neurogenic claudication: Secondary | ICD-10-CM | POA: Diagnosis not present

## 2021-12-23 DIAGNOSIS — M4316 Spondylolisthesis, lumbar region: Secondary | ICD-10-CM

## 2021-12-23 DIAGNOSIS — Z9889 Other specified postprocedural states: Secondary | ICD-10-CM | POA: Diagnosis not present

## 2021-12-23 DIAGNOSIS — M5126 Other intervertebral disc displacement, lumbar region: Secondary | ICD-10-CM | POA: Diagnosis not present

## 2021-12-23 MED ORDER — GADOBENATE DIMEGLUMINE 529 MG/ML IV SOLN
20.0000 mL | Freq: Once | INTRAVENOUS | Status: AC | PRN
  Administered 2021-12-23: 20 mL via INTRAVENOUS

## 2021-12-29 DIAGNOSIS — M1711 Unilateral primary osteoarthritis, right knee: Secondary | ICD-10-CM | POA: Diagnosis not present

## 2021-12-30 ENCOUNTER — Other Ambulatory Visit: Payer: Medicare Other

## 2022-01-02 ENCOUNTER — Encounter: Payer: Self-pay | Admitting: Family Medicine

## 2022-01-02 ENCOUNTER — Telehealth: Payer: Self-pay | Admitting: Family Medicine

## 2022-01-02 NOTE — Telephone Encounter (Signed)
Pt called stating that she is COVID+ as of 8.5.23 and her symptoms started on 8.1.23

## 2022-01-02 NOTE — Telephone Encounter (Signed)
Spoke with patient regarding COVID symptoms that started on 12/27/21.  Patient reports fatigue, fever and vomiting over the weekend. Denies nausea and fever today, continues to be fatigued. Declines visit at this time. Patient states she can manage the COVID symptoms however is asking PCP to review MRI results ordered by Neuro. Patient states she "has an infection in her back" that is being untreated as of right now. She has called Neuro today for next steps, awaiting call back.

## 2022-01-03 NOTE — Telephone Encounter (Signed)
Spoke with patient regarding PCP instructions.  Advised instructions will be sent via [place].  Also advised per PCP to contact Neuro for MRI results/plans.  Patient verbalized understanding.

## 2022-01-04 ENCOUNTER — Encounter (INDEPENDENT_AMBULATORY_CARE_PROVIDER_SITE_OTHER): Payer: Self-pay

## 2022-01-06 ENCOUNTER — Other Ambulatory Visit: Payer: Self-pay

## 2022-01-06 ENCOUNTER — Encounter (HOSPITAL_COMMUNITY): Payer: Self-pay | Admitting: Emergency Medicine

## 2022-01-06 ENCOUNTER — Emergency Department (HOSPITAL_COMMUNITY)
Admission: EM | Admit: 2022-01-06 | Discharge: 2022-01-07 | Disposition: A | Discharge status: Home / Self Care | Payer: Medicare Other | Attending: Emergency Medicine | Admitting: Emergency Medicine

## 2022-01-06 DIAGNOSIS — Z794 Long term (current) use of insulin: Secondary | ICD-10-CM | POA: Diagnosis not present

## 2022-01-06 DIAGNOSIS — M5416 Radiculopathy, lumbar region: Secondary | ICD-10-CM | POA: Diagnosis not present

## 2022-01-06 DIAGNOSIS — M5417 Radiculopathy, lumbosacral region: Secondary | ICD-10-CM | POA: Insufficient documentation

## 2022-01-06 DIAGNOSIS — M1711 Unilateral primary osteoarthritis, right knee: Secondary | ICD-10-CM | POA: Diagnosis not present

## 2022-01-06 DIAGNOSIS — M461 Sacroiliitis, not elsewhere classified: Secondary | ICD-10-CM | POA: Diagnosis not present

## 2022-01-06 DIAGNOSIS — R059 Cough, unspecified: Secondary | ICD-10-CM | POA: Insufficient documentation

## 2022-01-06 DIAGNOSIS — M4316 Spondylolisthesis, lumbar region: Secondary | ICD-10-CM | POA: Diagnosis not present

## 2022-01-06 DIAGNOSIS — M549 Dorsalgia, unspecified: Secondary | ICD-10-CM | POA: Diagnosis not present

## 2022-01-06 DIAGNOSIS — Z743 Need for continuous supervision: Secondary | ICD-10-CM | POA: Diagnosis not present

## 2022-01-06 DIAGNOSIS — M545 Low back pain, unspecified: Secondary | ICD-10-CM | POA: Diagnosis not present

## 2022-01-06 DIAGNOSIS — R202 Paresthesia of skin: Secondary | ICD-10-CM | POA: Diagnosis not present

## 2022-01-06 LAB — URINALYSIS, ROUTINE W REFLEX MICROSCOPIC
Bilirubin Urine: NEGATIVE
Glucose, UA: NEGATIVE mg/dL
Hgb urine dipstick: NEGATIVE
Ketones, ur: NEGATIVE mg/dL
Leukocytes,Ua: NEGATIVE
Nitrite: NEGATIVE
Protein, ur: NEGATIVE mg/dL
Specific Gravity, Urine: 1.005 (ref 1.005–1.030)
pH: 5 (ref 5.0–8.0)

## 2022-01-06 LAB — CBC
HCT: 35.3 % — ABNORMAL LOW (ref 36.0–46.0)
Hemoglobin: 11 g/dL — ABNORMAL LOW (ref 12.0–15.0)
MCH: 24.7 pg — ABNORMAL LOW (ref 26.0–34.0)
MCHC: 31.2 g/dL (ref 30.0–36.0)
MCV: 79.3 fL — ABNORMAL LOW (ref 80.0–100.0)
Platelets: 487 10*3/uL — ABNORMAL HIGH (ref 150–400)
RBC: 4.45 MIL/uL (ref 3.87–5.11)
RDW: 16.2 % — ABNORMAL HIGH (ref 11.5–15.5)
WBC: 10.3 10*3/uL (ref 4.0–10.5)
nRBC: 0 % (ref 0.0–0.2)

## 2022-01-06 LAB — BASIC METABOLIC PANEL
Anion gap: 13 (ref 5–15)
BUN: 19 mg/dL (ref 8–23)
CO2: 28 mmol/L (ref 22–32)
Calcium: 10.1 mg/dL (ref 8.9–10.3)
Chloride: 96 mmol/L — ABNORMAL LOW (ref 98–111)
Creatinine, Ser: 1.1 mg/dL — ABNORMAL HIGH (ref 0.44–1.00)
GFR, Estimated: 55 mL/min — ABNORMAL LOW (ref >60)
Glucose, Bld: 126 mg/dL — ABNORMAL HIGH (ref 70–99)
Potassium: 3.4 mmol/L — ABNORMAL LOW (ref 3.5–5.1)
Sodium: 137 mmol/L (ref 135–145)

## 2022-01-06 MED ORDER — ONDANSETRON 4 MG PO TBDP
8.0000 mg | ORAL_TABLET | Freq: Once | ORAL | Status: AC
Start: 2022-01-06 — End: 2022-01-06
  Administered 2022-01-06: 8 mg via ORAL
  Filled 2022-01-06: qty 2

## 2022-01-06 MED ORDER — OXYCODONE HCL 5 MG PO TABS
5.0000 mg | ORAL_TABLET | ORAL | Status: AC
  Administered 2022-01-06: 5 mg via ORAL
  Filled 2022-01-06: qty 1

## 2022-01-06 NOTE — ED Triage Notes (Signed)
Patient reports right low back pain radiating to right leg this week , denies injury or fall /no urinary discomfort .

## 2022-01-06 NOTE — ED Provider Triage Note (Signed)
Emergency Medicine Provider Triage Evaluation Note  [name] , a 66 y.o. female  was evaluated in triage.  Pt complains of low back pain.  Patient reports in May she had lumbar fusion with Dr. Christella Noa.  Patient reports since this time she has had increased back pain.  Patient has alerted Dr. Christella Noa multiple times about this however states that he "does not do anything".  Patient had MRI done 2 weeks ago showing small right dorsal epidural collection at the L2 level which is mildly concerning for an early epidural phlegmon/abscess.  This is mildly displaced of the right-sided cauda equina and could be a source of right lower extremity radiculopathy.  Patient was not placed on antibiotics at this time.  Patient denies red flag symptoms low back pain.  Patient denies fevers.  Review of Systems  Positive:  Negative:   Physical Exam  BP 138/79   Pulse (!) 105   Temp 99.5 F (37.5 C) (Oral)   Resp 16   SpO2 97%  Gen:   Awake, no distress   Resp:  Normal effort  MSK:   Moves extremities without difficulty  Other:  Incision appears clean dry and intact  Medical Decision Making  Medically screening exam initiated at 8:34 PM.  Appropriate orders placed.  [name] was informed that the remainder of the evaluation will be completed by another provider, this initial triage assessment does not replace that evaluation, and the importance of remaining in the ED until their evaluation is complete.     Holly Cecil, PA-C 01/06/22 2036

## 2022-01-06 NOTE — ED Triage Notes (Signed)
Patient arrived with EMS from home reports low back pain this week , denies injury or fall , no urinary discomfort , history of back surgery last April /MRI 2 weeks ago shows post surgical infection .

## 2022-01-07 ENCOUNTER — Emergency Department (HOSPITAL_COMMUNITY): Payer: Medicare Other

## 2022-01-07 ENCOUNTER — Encounter (HOSPITAL_COMMUNITY): Payer: Self-pay

## 2022-01-07 DIAGNOSIS — M461 Sacroiliitis, not elsewhere classified: Secondary | ICD-10-CM | POA: Diagnosis not present

## 2022-01-07 DIAGNOSIS — M5416 Radiculopathy, lumbar region: Secondary | ICD-10-CM | POA: Diagnosis not present

## 2022-01-07 DIAGNOSIS — M4316 Spondylolisthesis, lumbar region: Secondary | ICD-10-CM | POA: Diagnosis not present

## 2022-01-07 LAB — C-REACTIVE PROTEIN: CRP: 8.2 mg/dL — ABNORMAL HIGH (ref <1.0)

## 2022-01-07 LAB — LACTIC ACID, PLASMA: Lactic Acid, Venous: 0.9 mmol/L (ref 0.5–1.9)

## 2022-01-07 LAB — SEDIMENTATION RATE: Sed Rate: 81 mm/hr — ABNORMAL HIGH (ref 0–22)

## 2022-01-07 MED ORDER — LORAZEPAM 2 MG/ML IJ SOLN: 1.0000 mg | Freq: Once | INTRAMUSCULAR | Status: DC

## 2022-01-07 MED ORDER — HYDROMORPHONE HCL 1 MG/ML IJ SOLN
1.0000 mg | Freq: Once | INTRAMUSCULAR | Status: AC
  Administered 2022-01-07: 1 mg via INTRAVENOUS
  Filled 2022-01-07 (×2): qty 1

## 2022-01-07 MED ORDER — KETOROLAC TROMETHAMINE 30 MG/ML IJ SOLN
30.0000 mg | Freq: Once | INTRAMUSCULAR | Status: AC
  Administered 2022-01-07: 30 mg via INTRAVENOUS
  Filled 2022-01-07 (×2): qty 1

## 2022-01-07 MED ORDER — DEXAMETHASONE SODIUM PHOSPHATE 10 MG/ML IJ SOLN
10.0000 mg | Freq: Once | INTRAMUSCULAR | Status: AC
  Administered 2022-01-07: 10 mg via INTRAVENOUS
  Filled 2022-01-07: qty 1

## 2022-01-07 NOTE — ED Provider Notes (Signed)
Dr. Venetia Constable the neurosurgeon no wanted to get CT lumbar.  Does not show any signs consistent with infection.  Patient received Decadron.  She feels as if she could go home working to get her up and walk her some her sister can drive her home and her sister can also stay with her.   Fredia Sorrow, MD 01/07/22 2142

## 2022-01-07 NOTE — ED Notes (Signed)
RN reviewed discharge instructions with pt. Pt verbalized understanding and had no further questions. VSS upon discharge.  

## 2022-01-07 NOTE — Discharge Instructions (Signed)
Follow-up with neurosurgery.  MRI as we discussed recent concerns about infection but CT scan ruled that out.  Dr. Shon Hale good from neurosurgery felt it was good for you to go home.  Hopefully the Decadron injection that she got here will help.

## 2022-01-07 NOTE — ED Provider Notes (Signed)
We will recontact on-call neurosurgery MRI raises concerns for lumbar osteomyelitis.   Fredia Sorrow, MD 01/07/22 [phone]

## 2022-01-07 NOTE — ED Notes (Signed)
Pt able to ambulate without assitance. Zackowski MD made aware

## 2022-01-07 NOTE — Consult Note (Signed)
Neurosurgery Consultation  Reason for Consult: Hip pain, prior infection Referring Physician: Zackowski  CC: Right buttock pain  HPI: This is a 66 y.o. woman, pt of Dr. Cabbell's, previously had lumbar surgery with post-op infection that required packing / secondary intention healing. She now presents with worsening right buttock pain. It's located in the right buttock, some extension to the thigh and proximal right anterior hip, dull, worse with walking, improved with rest. She does describe some mild proximal numbness in that leg, none further, no significant back pain - all right SI region. The pain is sharp in quality, severe in intensity, different than what she had preop, which was more typically radicular in nature.   ROS: A 14 point ROS was performed and is negative except as noted in the HPI.   PMHx:  Past Medical History:  Diagnosis Date   Allergic rhinitis    Allergy    Anemia 07/17/2016   Anxiety    Asthma    seasonal   Blood transfusion without reported diagnosis    Breast cancer (HCC)    right   Bronchitis    Colon polyps    CPAP (continuous positive airway pressure) dependence    Depression    Diabetes mellitus    borderline but takes metformin   Edema    Family history of breast cancer in first degree relative    Fibromyalgia    GERD (gastroesophageal reflux disease)    Hyperglycemia 03/05/2017   Hyperlipidemia 08/21/2016   no meds   Hyperparathyroidism    Hypertension    controlled by medications   Joint pain    Neuromuscular disorder (HCC)    Fibromyalgia   Obesity    Osteoarthritis    RA   PONV (postoperative nausea and vomiting)    occasional   Pre-diabetes    Sleep apnea    wears cpap   Viral meningitis    Vitamin D deficiency    FamHx:  Family History  Problem Relation Age of Onset   Emphysema Father    Lung cancer Father        lung ca dx 79   Other Father        prostate issues   Diabetes Father    Breast cancer Maternal Aunt         dx late 40s   Colon cancer Maternal Aunt        dx 50s   Colon polyps Maternal Aunt    Prostate cancer Maternal Grandfather 85       d. 85y   Heart disease Paternal Grandfather    Heart attack Paternal Grandfather        d. 50y   Diabetes Paternal Grandfather    Asthma Daughter        "seasonal"   Arthritis Maternal Grandmother    Aneurysm Maternal Grandmother        d. brain aneurysm at 76   Arthritis Mother    Colon polyps Mother    Hypertension Mother    Sleep apnea Mother    Multiple sclerosis Sister    Breast cancer Sister 52       L IDC and DCIS; ER/PR+, Her2-   Fibrocystic breast disease Sister    Arthritis/Rheumatoid Sister    Breast cancer Sister    Cancer Maternal Uncle        d. mouth cancer at younger age; smoker   Other Paternal Uncle        muscle issues - couldn't walk   or talk; d. 20y   Cancer Maternal Aunt        lymphoma, dx 22s   Ovarian cancer Maternal Aunt        dx 109s; d. late 69s   Lung cancer Maternal Uncle        d. 70y; former smoker   Throat cancer Cousin        maternal 1st cousin; smoker   Breast cancer Cousin 8       maternal 1st cousin   Other Paternal Uncle        prostate issues   Breast cancer Cousin        paternal 1st cousin dx late 66s   Throat cancer Cousin        maternal 1st cousin; used SL tobacco   Prostate cancer Cousin        maternal 1st cousin dx 71s   Esophageal cancer Neg Hx    Rectal cancer Neg Hx    Stomach cancer Neg Hx    SocHx:  reports that she has never smoked. She has never used smokeless tobacco. She reports that she does not currently use alcohol. She reports that she does not use drugs.  Exam: Vital signs in last 24 hours: Temp:  [98.5 F (36.9 C)-99.5 F (37.5 C)] 99 F (37.2 C) (08/12 0859) Pulse Rate:  [88-105] 88 (08/12 1409) Resp:  [16-18] 18 (08/12 1409) BP: (116-138)/(68-79) 116/70 (08/12 1409) SpO2:  [96 %-99 %] 99 % (08/12 1409) General: Awake, alert, cooperative, lying in bed in NAD,  difficulty moving around the bed due to habitus and pain Head: Normocephalic and atruamatic HEENT: Neck supple Pulmonary: breathing room air comfortably, no evidence of increased work of breathing Cardiac: RRR Abdomen: S NT ND Extremities: Warm and well perfused x4 Neuro: incision well healed in midline lumbar spine Strength 5/5 x4, SILTx4 except some mild numbness in the right proximal leg - partial L2 or L3, less c/w LFCN Negative Gaenslen's, +Fortin finger / TTP over SI joint on the R, neg FABER but some pain with ext rotation of the R hip   Assessment and Plan: 66 y.o. woman with R buttock pain. MRI L-spine personally reviewed, compared with prior, the epidural material that was seen previously has resolved without treatment without progression, inconsistent with an infectious process. There is some vague / mild signal change in the lamina cranial to her surgical levels, unclear etiology. But the ligament between those bones does not appear to be inflamed or the associated pars / facets / Vbs, so it is three bones that are not connected by pathology. Clinical exam most c/w sacroiliitis, but some features of radiculopathy as well as some mild hip pathology.  -CT L-spine to put the issue to rest of ?osteomyelitis, I do not think that it's present but important to say with some certainty. Will also re-eval the hardware for pseudoarthrosis. -steroids for presumed sacroiliitis -if the steroids help enough that she's ambulatory, okay with discharge from ED, already has follow up with Dr. Christella Noa this Thursday upcoming -please call with any concerns or questions  Judith Part, MD 01/07/22 7:31 PM [facility]

## 2022-01-07 NOTE — ED Provider Notes (Signed)
[facility] Provider Note   CSN: [id number] Arrival date & time: 01/06/22  1930     History {Add pertinent medical, surgical, social history, OB history to HPI:1} Chief Complaint  Patient presents with   Back Pain    [name] is a 66 y.o. female.  HPI 09/21/2021 patient underwent 9 surgery as follows by Dr. Zoila Shutter THREE-FOUR, LUMBAR FOUR-FIVE POSTERIOR LUMBAR INTERBODY FUSION Expandable cages placed at L3/4. 4/5 packed with autograft morsels, disc space packed with autograft morsels [employer] Laminectomy L3, L4 beyond the needed exposure for a PLIF Segmental pedicle screw fixation L3-L5(Nuvasive Relign).  She reports she has been having complications since her surgery.  She had a wound infection and required debridement and revision 5\12\2023 which showed Pseudomonas.  Due to ongoing pain problems patient had an MRI 7\28\2023 suggesting possible source of infection as follows:  IMPRESSION: 1. Small right dorsal epidural collection at the L2 level is concerning for early epidural phlegmon/abscess. This mildly displaces the right-sided cauda equina and could be a source of right lower extremity radiculopathy. 2. Status post L3-5 PLIF and recent dorsal wound closure/revision. 3. Increased L2-3 spinal canal stenosis, now moderate.  Patient reports that she has had fairly severe pain and she has pain that radiates all the way down the right leg.  Pain is intense if she tries to roll or move.  She is also having to try to use a cane to ambulate.  She essentially has not been able to resume activities of daily living due to right lower extremity pain and dysfunction.  She has not had specific identified fever over the past 4 to 5 days.  She reports she did have fever earlier in the week and was diagnosed with COVID at that time.  She reports she has a small amount of residual cough but tested negative for COVID as of 3 days  ago.  Patient was given a course of Levaquin about a month ago.  She did not get any improvement from that treatment.  She has been trying pain control at home but unable to get adequate pain control for even possibly resting.  Home Medications Prior to Admission medications   Medication Sig Start Date End Date Taking? Authorizing Provider  acetaminophen (TYLENOL) 650 MG CR tablet Take 1,300 mg by mouth every 8 (eight) hours as needed for pain.    [provider]  albuterol (VENTOLIN HFA) 108 (90 Base) MCG/ACT inhaler Inhale 2 puffs into the lungs every 6 (six) hours as needed. Patient taking differently: Inhale 2 puffs into the lungs every 6 (six) hours as needed for wheezing or shortness of breath. 04/05/21   Saguier, Percell Miller, PA-C  buPROPion [facility] SR) 150 MG 12 hr tablet TAKE 1 TABLET BY MOUTH DAILY 11/30/21   Mosie Lukes, MD  calcium carbonate (OSCAL) 1500 (600 Ca) MG TABS tablet Take 1,500 mg by mouth 2 (two) times daily with a meal.    [provider]  cholecalciferol (VITAMIN D3) 25 MCG (1000 UNIT) tablet Take 1,000 Units by mouth daily.    [provider]  Cyanocobalamin (B-12) 1000 MCG CAPS Take 1 capsule by mouth daily at 6 (six) AM. 11/21/21   Debbrah Alar, NP  diclofenac Sodium (VOLTAREN) 1 % GEL Apply 2 g topically 4 (four) times daily as needed (pain).    [provider]  famotidine (PEPCID) 40 MG tablet Take 1 tablet (40 mg total) by mouth daily. 12/11/18   Mosie Lukes,  MD  fluticasone (FLONASE) 50 MCG/ACT nasal spray Place 2 sprays into both nostrils daily. Patient taking differently: Place 2 sprays into both nostrils daily as needed for allergies. 10/28/20   Mosie Lukes, MD  furosemide (LASIX) 40 MG tablet TAKE 1 TABLET BY MOUTH  TWICE DAILY Patient taking differently: Take 40 mg by mouth daily. 05/24/21   Mosie Lukes, MD  hydrochlorothiazide (MICROZIDE) 12.5 MG capsule TAKE 1 CAPSULE BY MOUTH DAILY 12/01/21   Mosie Lukes, MD  Insulin Pen Needle 32G X 6 MM MISC Use with Victoza daily 09/13/20   [name] A, DO  Iron, Ferrous Sulfate, 325 (65 Fe) MG TABS Take 325 mg by mouth every other day. 11/21/21   Debbrah Alar, NP  metFORMIN (GLUCOPHAGE) 500 MG tablet TAKE 1 TABLET BY MOUTH  TWICE DAILY WITH A MEAL 12/20/21   Mosie Lukes, MD  Multiple Vitamins-Minerals (MULTIVITAMIN & MINERAL PO) Take 1 tablet by mouth daily.    [provider]  Oxycodone HCl 10 MG TABS Take 1 tablet (10 mg total) by mouth every 6 (six) hours as needed ((score 7 to 10)). 10/08/21   Eustace Picazo, MD  sertraline (ZOLOFT) 50 MG tablet TAKE ONE-HALF TABLET BY  MOUTH TWICE DAILY Patient taking differently: Take 25 mg by mouth daily. 07/26/21   Mosie Lukes, MD  telmisartan (MICARDIS) 20 MG tablet TAKE 1 TABLET BY MOUTH DAILY 12/01/21   Mosie Lukes, MD  tiZANidine (ZANAFLEX) 4 MG tablet Take 1 tablet (4 mg total) by mouth every 6 (six) hours as needed for muscle spasms. 10/08/21   Eustace Guerrier, MD  traMADol (ULTRAM) 50 MG tablet Take 50-100 mg by mouth 3 (three) times daily as needed for pain. 08/25/21   [provider]  Zinc 30 MG CAPS Take 30 mg by mouth daily.    [provider]      Allergies    Allopurinol, Mucinex [guaifenesin er], Penicillins, Prozac [fluoxetine hcl], Amitriptyline, and Lexapro [escitalopram oxalate]    Review of Systems   Review of Systems 10 systems reviewed negative except as per HPI Physical Exam Updated Vital Signs BP 119/74 (BP Location: Left Arm)   Pulse 92   Temp 99 F (37.2 C) (Oral)   Resp 18   SpO2 97%  Physical Exam Constitutional:      Comments: Alert with clear mental status.  No respiratory distress.  HENT:     Mouth/Throat:     Pharynx: Oropharynx is clear.  Eyes:     Extraocular Movements: Extraocular movements intact.  Cardiovascular:     Rate and Rhythm: Normal rate and regular rhythm.  Pulmonary:     Effort: Pulmonary effort is normal.      Breath sounds: Normal breath sounds.  Abdominal:     General: There is no distension.     Palpations: Abdomen is soft.     Tenderness: There is no abdominal tenderness. There is no guarding.  Musculoskeletal:     Comments: Back wound has healed.  No active drainage or discharge from surgical wound on the back.  She has severe pain with trying to roll from supine onto her side.  Significant pain with movement of the right leg.  Distal pulses 2+ symmetric.  Flexion/dorsiflection intact.  Skin:    General: Skin is warm and dry.  Neurological:     General: No focal deficit present.     Mental Status: She is oriented to person, place, and time.  ED Results / Procedures / Treatments   Labs (all labs ordered are listed, but only abnormal results are displayed) Labs Reviewed  CBC - Abnormal; Notable for the following components:      Result Value   Hemoglobin 11.0 (*)    HCT 35.3 (*)    MCV 79.3 (*)    MCH 24.7 (*)    RDW 16.2 (*)    Platelets 487 (*)    All other components within normal limits  BASIC METABOLIC PANEL - Abnormal; Notable for the following components:   Potassium 3.4 (*)    Chloride 96 (*)    Glucose, Bld 126 (*)    Creatinine, Ser 1.10 (*)    GFR, Estimated 55 (*)    All other components within normal limits  URINALYSIS, ROUTINE W REFLEX MICROSCOPIC - Abnormal; Notable for the following components:   Color, Urine COLORLESS (*)    Bacteria, UA RARE (*)    All other components within normal limits    EKG None  Radiology No results found.  Procedures Procedures  {Document cardiac monitor, telemetry assessment procedure when appropriate:1}  Medications Ordered in ED Medications  ondansetron (ZOFRAN-ODT) disintegrating tablet 8 mg (8 mg Oral Given 01/06/22 2044)  oxyCODONE (Oxy IR/ROXICODONE) immediate release tablet 5 mg (5 mg Oral Given 01/06/22 2044)    ED Course/ Medical Decision Making/ A&P                           Medical Decision Making Amount  and/or Complexity of Data Reviewed Labs: ordered. Radiology: ordered.  Risk Prescription drug management.   Consult: Reviewed with Dr. Venetia Constable.  He advises symptoms sound fairly stable but with worsening pain can proceed with repeat MRI for any change in fluid collection or abscess.  For Zackowski to review MRI results and assess for final disposition.  {Document critical care time when appropriate:1} {Document review of labs and clinical decision tools ie heart score, Chads2Vasc2 etc:1}  {Document your independent review of radiology images, and any outside records:1} {Document your discussion with family members, caretakers, and with consultants:1} {Document social determinants of health affecting pt's care:1} {Document your decision making why or why not admission, treatments were needed:1} Final Clinical Impression(s) / ED Diagnoses Final diagnoses:  None    Rx / DC Orders ED Discharge Orders     None

## 2022-01-08 NOTE — Progress Notes (Signed)
[facility])                                            [place] Team                                        Statin Quality Measure Assessment    01/08/2022  [name] 01/29/1956 [id number]  Per review of chart and payor information, this patient has been flagged for non-adherence to the following CMS Quality Measure:   '[x]'$  Statin Use in Persons with Diabetes  '[]'$  Statin Use in Persons with Cardiovascular Disease  The 10-year ASCVD risk score (Arnett DK, et al., 2019) is: 12%   Values used to calculate the score:     Age: 66 years     Sex: Female     Is Non-Hispanic African American: No     Diabetic: Yes     Tobacco smoker: No     Systolic Blood Pressure: 233 mmHg     Is BP treated: Yes     HDL Cholesterol: 59.5 mg/dL     Total Cholesterol: 160 mg/dL    Please consider ONE of the following recommendations:   Initiate high intensity statin Atorvastatin '40mg'$  once daily, #90, 3 refills   Rosuvastatin '20mg'$  once daily, #90, 3 refills    Initiate moderate intensity          statin with reduced frequency if prior          statin intolerance 1x weekly, #13, 3 refills   2x weekly, #26, 3 refills   3x weekly, #39, 3 refills   Code for past statin intolerance or other exclusions (required annually)  Drug Induced Myopathy G72.0   Myositis, unspecified M60.9   Rhabdomyolysis M62.82   Cirrhosis of liver K74.69   Biliary cirrhosis, unspecified K74.5   Abnormal blood glucose - for SUPD ONLY R73.09   Prediabetes - for SUPD ONLY  R73.03   Polycystic ovarian syndrome E28.2   Adverse effect of antihyperlipidemic and antiarteriosclerotic drugs, initial encounter K12.2E4L   Thank you for your time,  Kristeen Miss, [place] Cell: [phone]

## 2022-01-10 ENCOUNTER — Ambulatory Visit (INDEPENDENT_AMBULATORY_CARE_PROVIDER_SITE_OTHER): Payer: Medicare Other | Admitting: Family Medicine

## 2022-01-10 VITALS — BP 132/80 | HR 101 | Temp 98.0°F | Resp 16 | Ht 66.0 in | Wt 284.6 lb

## 2022-01-10 DIAGNOSIS — I10 Essential (primary) hypertension: Secondary | ICD-10-CM | POA: Diagnosis not present

## 2022-01-10 DIAGNOSIS — N289 Disorder of kidney and ureter, unspecified: Secondary | ICD-10-CM

## 2022-01-10 DIAGNOSIS — M5441 Lumbago with sciatica, right side: Secondary | ICD-10-CM | POA: Diagnosis not present

## 2022-01-10 DIAGNOSIS — E1140 Type 2 diabetes mellitus with diabetic neuropathy, unspecified: Secondary | ICD-10-CM

## 2022-01-10 DIAGNOSIS — M48061 Spinal stenosis, lumbar region without neurogenic claudication: Secondary | ICD-10-CM

## 2022-01-10 DIAGNOSIS — D649 Anemia, unspecified: Secondary | ICD-10-CM

## 2022-01-10 MED ORDER — METHYLPREDNISOLONE 4 MG PO TABS: ORAL_TABLET | ORAL | 0 refills | Status: DC

## 2022-01-10 NOTE — Progress Notes (Signed)
Subjective:   By signing my name below, I, Kellie Simmering, attest that this documentation has been prepared under the direction and in the presence of Mosie Lukes, MD 01/10/2022.   Patient ID: [name], female    DOB: 11-13-1955, 66 y.o.   MRN: [id number]  No chief complaint on file.  HPI Patient is in today for an office visit.  Spinal stenosis: She reports that she has been seeing her neurosurgeon Dr. Christella Noa and underwent back surgery on 09/21/2021 but suffered an infection the following week. She reports that she was not prescribed antibiotics initially to manage her infection. She states that she was admitted to the ER on 01/06/2022 for lower back pain. She states that she received an injection in her sacroiliac joint to manage her pain. She states that she was experiencing numbness on her right leg, which has subsided, but not entirely. She says that she is experiencing a pattern of constipation and then diarrhea.   Urination: She reports that she did not urinate form 08/11-08/13 but did urinate on 08/14.   Immunizations: She has been informed about receiving a COVID-19 immunization in the fall.   Past Medical History:  Diagnosis Date   Allergic rhinitis    Allergy    Anemia 07/17/2016   Anxiety    Asthma    seasonal   Blood transfusion without reported diagnosis    Breast cancer ([place])    right   Bronchitis    Colon polyps    CPAP (continuous positive airway pressure) dependence    Depression    Diabetes mellitus    borderline but takes metformin   Edema    Family history of breast cancer in first degree relative    Fibromyalgia    GERD (gastroesophageal reflux disease)    Hyperglycemia 03/05/2017   Hyperlipidemia 08/21/2016   no meds   Hyperparathyroidism    Hypertension    controlled by medications   Joint pain    Neuromuscular disorder ([place])    Fibromyalgia   Obesity    Osteoarthritis    RA   PONV (postoperative nausea and vomiting)     occasional   Pre-diabetes    Sleep apnea    wears cpap   Viral meningitis    Vitamin D deficiency    Past Surgical History:  Procedure Laterality Date   ABDOMINAL HYSTERECTOMY  1998   ADENOIDECTOMY     BACK SURGERY     09/21/21   BREAST EXCISIONAL BIOPSY Left 1993   benign   BREAST SURGERY     Tran flap due to breast cancer   CESAREAN SECTION  1988   COLONOSCOPY  08/14/2011   HAND SURGERY     dog bite, right hand   HAND SURGERY     trauma, left hand   KNEE ARTHROSCOPY     bilateral   left knee replacement  2010   LUMBAR WOUND DEBRIDEMENT N/A 10/07/2021   Procedure: LUMBAR WOUND DEBRIDEMENT/REVISION;  Surgeon: Ashok Pall, MD;  Location: [facility];  Service: Neurosurgery;  Laterality: N/A;  3C/RM 21 to follow Dr Kathyrn Sheriff   MASTECTOMY  01/13/1994   Right breast   parathyroid resection     PARATHYROIDECTOMY     POLYPECTOMY     rectal abscess     SEPTOPLASTY  1980   SHOULDER ARTHROSCOPY Right 11/09/2015   Procedure: ARTHROSCOPY SHOULDER-acromioplasty, distal clavicle resection and debridement;  Surgeon: Melrose Nakayama, MD;  Location: [facility];  Service: Orthopedics;  Laterality: Right;  shoulder arthroscopy     rotator cuff repair   TOE SURGERY Right    paronychia and adenoma removed   TONSILLECTOMY     TOTAL KNEE ARTHROPLASTY  06/18/2008   [facility]   TUMOR REMOVAL  1982   , scalp   Family History  Problem Relation Age of Onset   Emphysema Father    Lung cancer Father        lung ca dx 63   Other Father        prostate issues   Diabetes Father    Breast cancer Maternal Aunt        dx late 62s   Colon cancer Maternal Aunt        dx 32s   Colon polyps Maternal Aunt    Prostate cancer Maternal Grandfather 55       d. 85y   Heart disease Paternal Grandfather    Heart attack Paternal Grandfather        d. 50y   Diabetes Paternal Grandfather    Asthma Daughter        "seasonal"   Arthritis Maternal Grandmother    Aneurysm Maternal Grandmother        d. brain  aneurysm at 90   Arthritis Mother    Colon polyps Mother    Hypertension Mother    Sleep apnea Mother    Multiple sclerosis Sister    Breast cancer Sister 51       L IDC and DCIS; ER/PR+, Her2-   Fibrocystic breast disease Sister    Arthritis/Rheumatoid Sister    Breast cancer Sister    Cancer Maternal Uncle        d. mouth cancer at younger age; smoker   Other Paternal Uncle        muscle issues - couldn't walk or talk; d. 20y   Cancer Maternal Aunt        lymphoma, dx 70s   Ovarian cancer Maternal Aunt        dx 28s; d. late 36s   Lung cancer Maternal Uncle        d. 67y; former smoker   Throat cancer Cousin        maternal 1st cousin; smoker   Breast cancer Cousin 73       maternal 1st cousin   Other Paternal Uncle        prostate issues   Breast cancer Cousin        paternal 1st cousin dx late 53s   Throat cancer Cousin        maternal 1st cousin; used SL tobacco   Prostate cancer Cousin        maternal 1st cousin dx 7s   Esophageal cancer Neg Hx    Rectal cancer Neg Hx    Stomach cancer Neg Hx    Social History   Socioeconomic History   Marital status: Widowed    Spouse name: Not on file   Number of children: 1   Years of education: Not on file   Highest education level: 12th grade  Occupational History   Occupation: ACCOUNTING    Employer: [employer]  Tobacco Use   Smoking status: Never   Smokeless tobacco: Never  Vaping Use   Vaping Use: Never used  Substance and Sexual Activity   Alcohol use: Not Currently   Drug use: No   Sexual activity: Not Currently  Other Topics Concern   Not on file  Social History Narrative  Not on file   Social Determinants of Health   Financial Resource Strain: Not on file  Food Insecurity: Not on file  Transportation Needs: No Transportation Needs (07/16/2018)   PRAPARE - Hydrologist (Medical): No    Lack of Transportation (Non-Medical): No  Physical Activity: Not on file   Stress: Not on file  Social Connections: Not on file  Intimate Partner Violence: Not on file   Outpatient Medications Prior to Visit  Medication Sig Dispense Refill   acetaminophen (TYLENOL) 650 MG CR tablet Take 1,300 mg by mouth every 8 (eight) hours as needed for pain.     albuterol (VENTOLIN HFA) 108 (90 Base) MCG/ACT inhaler Inhale 2 puffs into the lungs every 6 (six) hours as needed. (Patient taking differently: Inhale 2 puffs into the lungs every 6 (six) hours as needed for wheezing or shortness of breath.) 18 g 0   buPROPion (WELLBUTRIN SR) 150 MG 12 hr tablet TAKE 1 TABLET BY MOUTH DAILY (Patient taking differently: Take 150 mg by mouth daily.) 90 tablet 3   calcium carbonate (OSCAL) 1500 (600 Ca) MG TABS tablet Take 1,500 mg by mouth 2 (two) times daily with a meal.     cholecalciferol (VITAMIN D3) 25 MCG (1000 UNIT) tablet Take 1,000 Units by mouth daily.     Cyanocobalamin (B-12) 1000 MCG CAPS Take 1 capsule by mouth daily at 6 (six) AM. (Patient not taking: Reported on 01/07/2022)     cyclobenzaprine (FLEXERIL) 10 MG tablet Take 10 mg by mouth 3 (three) times daily as needed for muscle spasms.     diclofenac Sodium (VOLTAREN) 1 % GEL Apply 2 g topically 4 (four) times daily as needed (pain).     famotidine (PEPCID) 40 MG tablet Take 1 tablet (40 mg total) by mouth daily. 30 tablet 5   fluticasone (FLONASE) 50 MCG/ACT nasal spray Place 2 sprays into both nostrils daily. (Patient taking differently: Place 2 sprays into both nostrils daily as needed for allergies.) 16 g 5   furosemide (LASIX) 40 MG tablet TAKE 1 TABLET BY MOUTH  TWICE DAILY (Patient taking differently: Take 40 mg by mouth daily.) 180 tablet 3   hydrochlorothiazide (MICROZIDE) 12.5 MG capsule TAKE 1 CAPSULE BY MOUTH DAILY (Patient taking differently: Take 12.5 mg by mouth daily.) 90 capsule 3   HYDROcodone-acetaminophen (NORCO/VICODIN) 5-325 MG tablet Take 1 tablet by mouth every 6 (six) hours as needed for moderate pain.      Insulin Pen Needle 32G X 6 MM MISC Use with Victoza daily 50 each 0   Iron, Ferrous Sulfate, 325 (65 Fe) MG TABS Take 325 mg by mouth every other day. (Patient taking differently: Take 325 mg by mouth daily.) 30 tablet    meloxicam (MOBIC) 15 MG tablet Take 15 mg by mouth daily.     metFORMIN (GLUCOPHAGE) 500 MG tablet TAKE 1 TABLET BY MOUTH  TWICE DAILY WITH A MEAL (Patient taking differently: Take 500 mg by mouth 2 (two) times daily with a meal.) 180 tablet 3   Multiple Vitamins-Minerals (MULTIVITAMIN & MINERAL PO) Take 1 tablet by mouth daily.     Oxycodone HCl 10 MG TABS Take 1 tablet (10 mg total) by mouth every 6 (six) hours as needed ((score 7 to 10)). (Patient not taking: Reported on 01/07/2022) 30 tablet 0   sertraline (ZOLOFT) 50 MG tablet TAKE ONE-HALF TABLET BY  MOUTH TWICE DAILY (Patient not taking: Reported on 01/07/2022) 90 tablet 3   telmisartan (MICARDIS)  20 MG tablet TAKE 1 TABLET BY MOUTH DAILY (Patient taking differently: Take 20 mg by mouth daily.) 90 tablet 3   tiZANidine (ZANAFLEX) 4 MG tablet Take 1 tablet (4 mg total) by mouth every 6 (six) hours as needed for muscle spasms. (Patient not taking: Reported on 01/07/2022) 30 tablet 0   No facility-administered medications prior to visit.   Allergies  Allergen Reactions   Allopurinol Hives   Mucinex [Guaifenesin Er] Shortness Of Breath   Penicillins Hives and Other (See Comments)    Has patient had a PCN reaction causing immediate rash, facial/tongue/throat swelling, SOB or lightheadedness with hypotension: no Has patient had a PCN reaction causing severe rash involving mucus membranes or skin necrosis: no Has patient had a PCN reaction that required hospitalization no Has patient had a PCN reaction occurring within the last 10 years: no If all of the above answers are "NO", then may proceed with Cephalosporin use.    Prozac [Fluoxetine Hcl] Hives   Amitriptyline Other (See Comments)    "felt weird, fatigue, dizziness"    Lexapro [Escitalopram Oxalate]     Nausea and hypersalivation.    ROS     Objective:    Physical Exam Constitutional:      General: She is not in acute distress.    Appearance: Normal appearance. She is not ill-appearing.  HENT:     Head: Normocephalic and atraumatic.     Right Ear: External ear normal.     Left Ear: External ear normal.     Mouth/Throat:     Mouth: Mucous membranes are moist.     Pharynx: Oropharynx is clear.  Eyes:     Extraocular Movements: Extraocular movements intact.     Pupils: Pupils are equal, round, and reactive to light.  Cardiovascular:     Rate and Rhythm: Normal rate and regular rhythm.     Pulses: Normal pulses.     Heart sounds: Normal heart sounds. No murmur heard.    No gallop.  Pulmonary:     Effort: No respiratory distress.     Breath sounds: No wheezing or rales.  Skin:    General: Skin is warm and dry.  Neurological:     Mental Status: She is alert and oriented to person, place, and time.  Psychiatric:        Mood and Affect: Mood normal.        Behavior: Behavior normal.        Judgment: Judgment normal.    There were no vitals taken for this visit. Wt Readings from Last 3 Encounters:  11/21/21 284 lb (128.8 kg)  10/07/21 260 lb (117.9 kg)  09/21/21 260 lb (117.9 kg)   Diabetic Foot Exam - Simple   No data filed    Lab Results  Component Value Date   WBC 10.3 01/06/2022   HGB 11.0 (L) 01/06/2022   HCT 35.3 (L) 01/06/2022   PLT 487 (H) 01/06/2022   GLUCOSE 126 (H) 01/06/2022   CHOL 160 11/17/2021   TRIG 65.0 11/17/2021   HDL 59.50 11/17/2021   LDLDIRECT 110.0 08/21/2016   LDLCALC 87 11/17/2021   ALT 10 11/17/2021   AST 12 11/17/2021   NA 137 01/06/2022   K 3.4 (L) 01/06/2022   CL 96 (L) 01/06/2022   CREATININE 1.10 (H) 01/06/2022   BUN 19 01/06/2022   CO2 28 01/06/2022   TSH 0.79 11/17/2021   INR 2.0 (H) 06/21/2008   HGBA1C 6.0 11/17/2021   Lab Results  Component Value Date   TSH 0.79 11/17/2021    Lab Results  Component Value Date   WBC 10.3 01/06/2022   HGB 11.0 (L) 01/06/2022   HCT 35.3 (L) 01/06/2022   MCV 79.3 (L) 01/06/2022   PLT 487 (H) 01/06/2022   Lab Results  Component Value Date   NA 137 01/06/2022   K 3.4 (L) 01/06/2022   CO2 28 01/06/2022   GLUCOSE 126 (H) 01/06/2022   BUN 19 01/06/2022   CREATININE 1.10 (H) 01/06/2022   BILITOT 0.3 11/17/2021   ALKPHOS 90 11/17/2021   AST 12 11/17/2021   ALT 10 11/17/2021   PROT 6.2 11/17/2021   ALBUMIN 3.8 11/17/2021   CALCIUM 10.1 01/06/2022   ANIONGAP 13 01/06/2022   GFR 66.24 11/17/2021   Lab Results  Component Value Date   CHOL 160 11/17/2021   Lab Results  Component Value Date   HDL 59.50 11/17/2021   Lab Results  Component Value Date   LDLCALC 87 11/17/2021   Lab Results  Component Value Date   TRIG 65.0 11/17/2021   Lab Results  Component Value Date   CHOLHDL 3 11/17/2021   Lab Results  Component Value Date   HGBA1C 6.0 11/17/2021      Assessment & Plan:   Problem List Items Addressed This Visit   None  No orders of the defined types were placed in this encounter.  I, Kellie Simmering, personally preformed the services described in this documentation.  All medical record entries made by the scribe were at my direction and in my presence.  I have reviewed the chart and discharge instructions (if applicable) and agree that the record reflects my personal performance and is accurate and complete. 01/10/2022  I,Mohammed Iqbal,acting as a scribe for Penni Homans, MD.,have documented all relevant documentation on the behalf of Penni Homans, MD,as directed by  Penni Homans, MD while in the presence of Penni Homans, MD.  Kellie Simmering

## 2022-01-10 NOTE — Patient Instructions (Signed)
Spinal Stenosis  Spinal stenosis is a condition that happens when the spinal canal narrows. The spinal canal is the space between the bones of your spine (vertebrae). This narrowing puts pressure on the spinal cord and nerves that exit the spine and run down the arms or legs. When nerves exiting the spine are pinched, it can cause pain, numbness, or weakness in the arms or legs. Spinal stenosis can affect the vertebrae in the neck, upper back, and lower back. Spinal stenosis can range from mild to severe. What are the causes? This condition is caused by areas of bone pushing into the spinal canal. This condition may be present at birth (congenital), or it may be caused by: Slow breakdown of your vertebrae (spinal degeneration). This usually starts between 22 and 9 years of age. Injury (trauma) to your spine. Previous spinal surgery. Tumors in your spine. Calcium deposits in your spine. What increases the risk? The following factors may make you more likely to develop this condition: Being older than age 22. Being born with an abnormally shaped spine (congenitalspinal deformity), such as scoliosis. Having arthritis. What are the signs or symptoms? Symptoms of this condition include: Pain in the neck or back that is generally worse with activities, particularly when standing or walking. Numbness, tingling, hot or cold sensations, weakness, or tiredness (fatigue) in your arms or legs. This can happen in one arm or leg, or both. Pain going from the buttock, down the thigh, and to the calf (sciatica). This can happen in one or both legs. Falling frequently. Foot drop. This is when you have trouble lifting the front part of your foot and it drags on the ground when you walk. This can lead to muscle weakness. In more severe cases, you may develop: Problems having a bowel movement or urinating. Difficulty having sex. Loss of feeling in your legs and inability to walk. Symptoms may come on slowly  and get worse over time. In some cases, there are no symptoms. How is this diagnosed? This condition is diagnosed based on your medical history and a physical exam. You may also have tests, such as an X-ray, CT scan, or MRI. How is this treated? Treatment for this condition often focuses on managing your pain and any other symptoms. Treatment may include: Practicing good posture to lessen pressure on your nerves. Exercises to strengthen muscles, build endurance, improve balance, and maintain range of motion. This may include physical therapy to restore movement and strength to your back. Losing weight, if needed. Medicines to reduce inflammation or pain. This may include a medicine that is injected into your spine (steroidinjection). Assistive devices, such as a corset or brace. In some cases, surgery may be needed. The most common procedure is decompression laminectomy. This removes excess bone that puts pressure on your nerve roots. Follow these instructions at home: Managing pain, stiffness, and swelling  Practice good posture. If you were given a brace or a corset, wear it as told by your health care provider. Maintain a healthy weight. Talk with your health care provider if you need help losing weight. If directed, apply heat to the affected area as often as told by your health care provider. Use the heat source that your health care provider recommends, such as a moist heat pack or a heating pad. Place a towel between your skin and the heat source. Leave the heat on for 20-30 minutes. If your skin turns bright red, remove the heat right away to prevent burns. The risk  of burns is higher if you cannot feel pain, heat, or cold. Activity Do all exercises and stretches as told by your health care provider. Do not do any activities that cause pain. You may have to avoid lifting. Ask your health care provider how much you can safely lift. Return to your normal activities as told by your  health care provider. Ask your health care provider what activities are safe for you. General instructions Take over-the-counter and prescription medicines only as told by your health care provider. Do not use any products that contain nicotine or tobacco. These products include cigarettes, chewing tobacco, and vaping devices, such as e-cigarettes. If you need help quitting, ask your health care provider. Eat a healthy diet. This includes plenty of fruits and vegetables, whole grains, and low-fat (lean) protein. Where to find more information [employer] of Arthritis and Musculoskeletal and Skin Diseases: www.niams.[employer].es Contact a health care provider if: Your symptoms do not get better or they get worse. You have a fever. Get help right away if: You have new pain or symptoms of severe pain, such as: New or worsening pain in your neck or upper back. Severe pain that cannot be controlled with medicines. A severe headache that gets worse when you stand. You are dizzy. You have vision problems, such as blurred vision or double vision. You have nausea or vomiting. You develop new or worsening numbness or tingling in your back or legs. You lose control of your bowels or bladder. You have pain, redness, swelling, or warmth in your arm or leg. These symptoms may be an emergency. Get help right away. Call 911. Do not wait to see if the symptoms will go away. Do not drive yourself to the hospital. Summary Spinal stenosis is a condition that happens when the spinal canal narrows, putting pressure on the spinal cord or nerves that exit the vertebrae. This condition is caused by areas of bone pushing into the spinal canal. Spinal stenosis can cause numbness, weakness, or pain in the buttocks, neck, back, arms, and legs. This condition is usually diagnosed with your medical history, a physical exam, and tests, such as an X-ray, CT scan, or MRI. This information is not intended to replace  advice given to you by your health care provider. Make sure you discuss any questions you have with your health care provider. Document Revised: 08/09/2021 Document Reviewed: 08/09/2021 Elsevier Patient Education  [place]. Chronic Back Pain When back pain lasts longer than 3 months, it is called chronic back pain. The cause of your back pain may not be known. Some common causes include: Wear and tear (degenerative disease) of the bones, ligaments, or disks in your back. Inflammation and stiffness in your back (arthritis). People who have chronic back pain often go through certain periods in which the pain is more intense (flare-ups). Many people can learn to manage the pain with home care. Follow these instructions at home: Pay attention to any changes in your symptoms. Take these actions to help with your pain: Managing pain and stiffness     If directed, apply ice to the painful area. Your health care provider may recommend applying ice during the first 24-48 hours after a flare-up begins. To do this: Put ice in a plastic bag. Place a towel between your skin and the bag. Leave the ice on for 20 minutes, 2-3 times per day. If directed, apply heat to the affected area as often as told by your health care provider. Use  the heat source that your health care provider recommends, such as a moist heat pack or a heating pad. Place a towel between your skin and the heat source. Leave the heat on for 20-30 minutes. Remove the heat if your skin turns bright red. This is especially important if you are unable to feel pain, heat, or cold. You may have a greater risk of getting burned. Try soaking in a warm tub. Activity  Avoid bending and other activities that make the problem worse. Maintain a proper position when standing or sitting: When standing, keep your upper back and neck straight, with your shoulders pulled back. Avoid slouching. When sitting, keep your back straight and relax  your shoulders. Do not round your shoulders or pull them backward. Do not sit or stand in one place for long periods of time. Take brief periods of rest throughout the day. This will reduce your pain. Resting in a lying or standing position is usually better than sitting to rest. When you are resting for longer periods, mix in some mild activity or stretching between periods of rest. This will help to prevent stiffness and pain. Get regular exercise. Ask your health care provider what activities are safe for you. Do not lift anything that is heavier than 10 lb (4.5 kg), or the limit that you are told, until your health care provider says that it is safe. Always use proper lifting technique, which includes: Bending your knees. Keeping the load close to your body. Avoiding twisting. Sleep on a firm mattress in a comfortable position. Try lying on your side with your knees slightly bent. If you lie on your back, put a pillow under your knees. Medicines Treatment may include medicines for pain and inflammation taken by mouth or applied to the skin, prescription pain medicine, or muscle relaxants. Take over-the-counter and prescription medicines only as told by your health care provider. Ask your health care provider if the medicine prescribed to you: Requires you to avoid driving or using machinery. Can cause constipation. You may need to take these actions to prevent or treat constipation: Drink enough fluid to keep your urine pale yellow. Take over-the-counter or prescription medicines. Eat foods that are high in fiber, such as beans, whole grains, and fresh fruits and vegetables. Limit foods that are high in fat and processed sugars, such as fried or sweet foods. General instructions Do not use any products that contain nicotine or tobacco, such as cigarettes, e-cigarettes, and chewing tobacco. If you need help quitting, ask your health care provider. Keep all follow-up visits as told by your  health care provider. This is important. Contact a health care provider if: You have pain that is not relieved with rest or medicine. Your pain gets worse, or you have new pain. You have a high fever. You have rapid weight loss. You have trouble doing your normal activities. Get help right away if: You have weakness or numbness in one or both of your legs or feet. You have trouble controlling your bladder or your bowels. You have severe back pain and have any of the following: Nausea or vomiting. Pain in your abdomen. Shortness of breath or you faint. Summary Chronic back pain is back pain that lasts longer than 3 months. When a flare-up begins, apply ice to the painful area for the first 24-48 hours. Apply a moist heat pad or use a heating pad on the painful area as directed by your health care provider. When you are resting for longer  periods, mix in some mild activity or stretching between periods of rest. This will help to prevent stiffness and pain. This information is not intended to replace advice given to you by your health care provider. Make sure you discuss any questions you have with your health care provider. Document Revised: 06/25/2019 Document Reviewed: 06/25/2019 Elsevier Patient Education  [place].

## 2022-01-11 DIAGNOSIS — M545 Low back pain, unspecified: Secondary | ICD-10-CM | POA: Insufficient documentation

## 2022-01-11 HISTORY — DX: Low back pain, unspecified: M54.50

## 2022-01-11 LAB — CBC WITH DIFFERENTIAL/PLATELET
Basophils Absolute: 0.2 10*3/uL — ABNORMAL HIGH (ref 0.0–0.1)
Basophils Relative: 1.6 % (ref 0.0–3.0)
Eosinophils Absolute: 0.1 10*3/uL (ref 0.0–0.7)
Eosinophils Relative: 0.9 % (ref 0.0–5.0)
HCT: 32.7 % — ABNORMAL LOW (ref 36.0–46.0)
Hemoglobin: 10.4 g/dL — ABNORMAL LOW (ref 12.0–15.0)
Lymphocytes Relative: 26.8 % (ref 12.0–46.0)
Lymphs Abs: 2.8 10*3/uL (ref 0.7–4.0)
MCHC: 31.7 g/dL (ref 30.0–36.0)
MCV: 78.1 fl (ref 78.0–100.0)
Monocytes Absolute: 1.3 10*3/uL — ABNORMAL HIGH (ref 0.1–1.0)
Monocytes Relative: 12.8 % — ABNORMAL HIGH (ref 3.0–12.0)
Neutro Abs: 6 10*3/uL (ref 1.4–7.7)
Neutrophils Relative %: 57.9 % (ref 43.0–77.0)
Platelets: 411 10*3/uL — ABNORMAL HIGH (ref 150.0–400.0)
RBC: 4.19 Mil/uL (ref 3.87–5.11)
RDW: 17.7 % — ABNORMAL HIGH (ref 11.5–15.5)
WBC: 10.3 10*3/uL (ref 4.0–10.5)

## 2022-01-11 LAB — IBC + FERRITIN
Ferritin: 66.8 ng/mL (ref 10.0–291.0)
Iron: 23 ug/dL — ABNORMAL LOW (ref 42–145)
Saturation Ratios: 5.8 % — ABNORMAL LOW (ref 20.0–50.0)
TIBC: 393.4 ug/dL (ref 250.0–450.0)
Transferrin: 281 mg/dL (ref 212.0–360.0)

## 2022-01-11 LAB — SEDIMENTATION RATE: Sed Rate: 86 mm/hr — ABNORMAL HIGH (ref 0–30)

## 2022-01-11 LAB — COMPREHENSIVE METABOLIC PANEL
ALT: 11 U/L (ref 0–35)
AST: 12 U/L (ref 0–37)
Albumin: 3.8 g/dL (ref 3.5–5.2)
Alkaline Phosphatase: 70 U/L (ref 39–117)
BUN: 21 mg/dL (ref 6–23)
CO2: 32 mEq/L (ref 19–32)
Calcium: 10.1 mg/dL (ref 8.4–10.5)
Chloride: 96 mEq/L (ref 96–112)
Creatinine, Ser: 1.01 mg/dL (ref 0.40–1.20)
GFR: 57.62 mL/min — ABNORMAL LOW (ref >60.00)
Glucose, Bld: 103 mg/dL — ABNORMAL HIGH (ref 70–99)
Potassium: 4.9 mEq/L (ref 3.5–5.1)
Sodium: 138 mEq/L (ref 135–145)
Total Bilirubin: 0.3 mg/dL (ref 0.2–1.2)
Total Protein: 7.1 g/dL (ref 6.0–8.3)

## 2022-01-11 LAB — HIGH SENSITIVITY CRP: CRP, High Sensitivity: 32.79 mg/L — ABNORMAL HIGH (ref 0.000–5.000)

## 2022-01-11 NOTE — Assessment & Plan Note (Signed)
Well controlled, no changes to meds. Encouraged heart healthy diet such as the DASH diet and exercise as tolerated.  °

## 2022-01-11 NOTE — Assessment & Plan Note (Signed)
hgba1c acceptable, minimize simple carbs. Increase exercise as tolerated. Continue current meds 

## 2022-01-11 NOTE — Assessment & Plan Note (Signed)
She has had surgery with neurosurgery in the past few months and then developed an infection. Her wound had very delayed healing and she finally had her wound heal and close after a course of Levaquin. No fevers or chills and her pain is present but improving. She is referred to a new neurosurgeon to be reevaluated and she is started on a Medrol dosepak and Ciprofloxacin.

## 2022-01-12 ENCOUNTER — Other Ambulatory Visit: Payer: Self-pay

## 2022-01-12 ENCOUNTER — Encounter: Payer: Self-pay | Admitting: Family Medicine

## 2022-01-12 ENCOUNTER — Other Ambulatory Visit: Payer: Self-pay | Admitting: Family Medicine

## 2022-01-12 DIAGNOSIS — I10 Essential (primary) hypertension: Secondary | ICD-10-CM

## 2022-01-12 DIAGNOSIS — E782 Mixed hyperlipidemia: Secondary | ICD-10-CM

## 2022-01-12 DIAGNOSIS — M5441 Lumbago with sciatica, right side: Secondary | ICD-10-CM

## 2022-01-12 DIAGNOSIS — D649 Anemia, unspecified: Secondary | ICD-10-CM

## 2022-01-12 LAB — CULTURE, BLOOD (ROUTINE X 2)
Culture: NO GROWTH
Culture: NO GROWTH

## 2022-01-12 MED ORDER — HEMOCYTE-F 324-1 MG PO TABS
1.0000 | ORAL_TABLET | Freq: Every day | ORAL | 3 refills | Status: DC
Start: 2022-01-12 — End: 2022-02-21

## 2022-01-12 MED ORDER — CIPROFLOXACIN HCL 500 MG PO TABS: 500.0000 mg | ORAL_TABLET | Freq: Two times a day (BID) | ORAL | 0 refills | Status: AC

## 2022-01-12 NOTE — Progress Notes (Signed)
cbc

## 2022-01-14 ENCOUNTER — Encounter: Payer: Self-pay | Admitting: Family Medicine

## 2022-01-18 ENCOUNTER — Other Ambulatory Visit: Payer: Self-pay | Admitting: Family Medicine

## 2022-01-18 ENCOUNTER — Encounter: Payer: Self-pay | Admitting: Family Medicine

## 2022-01-18 DIAGNOSIS — M069 Rheumatoid arthritis, unspecified: Secondary | ICD-10-CM

## 2022-01-18 DIAGNOSIS — M5441 Lumbago with sciatica, right side: Secondary | ICD-10-CM

## 2022-01-18 DIAGNOSIS — M4316 Spondylolisthesis, lumbar region: Secondary | ICD-10-CM

## 2022-01-20 ENCOUNTER — Other Ambulatory Visit: Payer: Self-pay | Admitting: Family Medicine

## 2022-01-20 ENCOUNTER — Other Ambulatory Visit (INDEPENDENT_AMBULATORY_CARE_PROVIDER_SITE_OTHER): Payer: Medicare Other

## 2022-01-20 ENCOUNTER — Encounter: Payer: Self-pay | Admitting: Family Medicine

## 2022-01-20 DIAGNOSIS — I10 Essential (primary) hypertension: Secondary | ICD-10-CM | POA: Diagnosis not present

## 2022-01-20 DIAGNOSIS — E782 Mixed hyperlipidemia: Secondary | ICD-10-CM | POA: Diagnosis not present

## 2022-01-20 DIAGNOSIS — M5441 Lumbago with sciatica, right side: Secondary | ICD-10-CM

## 2022-01-20 DIAGNOSIS — D649 Anemia, unspecified: Secondary | ICD-10-CM | POA: Diagnosis not present

## 2022-01-20 LAB — COMPREHENSIVE METABOLIC PANEL
ALT: 11 U/L (ref 0–35)
AST: 9 U/L (ref 0–37)
Albumin: 4 g/dL (ref 3.5–5.2)
Alkaline Phosphatase: 71 U/L (ref 39–117)
BUN: 26 mg/dL — ABNORMAL HIGH (ref 6–23)
CO2: 33 mEq/L — ABNORMAL HIGH (ref 19–32)
Calcium: 9.5 mg/dL (ref 8.4–10.5)
Chloride: 96 mEq/L (ref 96–112)
Creatinine, Ser: 1.1 mg/dL (ref 0.40–1.20)
GFR: 52 mL/min — ABNORMAL LOW (ref >60.00)
Glucose, Bld: 91 mg/dL (ref 70–99)
Potassium: 4.3 mEq/L (ref 3.5–5.1)
Sodium: 139 mEq/L (ref 135–145)
Total Bilirubin: 0.5 mg/dL (ref 0.2–1.2)
Total Protein: 7.1 g/dL (ref 6.0–8.3)

## 2022-01-20 LAB — CBC WITH DIFFERENTIAL/PLATELET
Basophils Absolute: 0.1 10*3/uL (ref 0.0–0.1)
Basophils Relative: 0.7 % (ref 0.0–3.0)
Eosinophils Absolute: 0.1 10*3/uL (ref 0.0–0.7)
Eosinophils Relative: 0.7 % (ref 0.0–5.0)
HCT: 35.3 % — ABNORMAL LOW (ref 36.0–46.0)
Hemoglobin: 11.2 g/dL — ABNORMAL LOW (ref 12.0–15.0)
Lymphocytes Relative: 33 % (ref 12.0–46.0)
Lymphs Abs: 4.5 10*3/uL — ABNORMAL HIGH (ref 0.7–4.0)
MCHC: 31.7 g/dL (ref 30.0–36.0)
MCV: 79 fl (ref 78.0–100.0)
Monocytes Absolute: 1.2 10*3/uL — ABNORMAL HIGH (ref 0.1–1.0)
Monocytes Relative: 8.8 % (ref 3.0–12.0)
Neutro Abs: 7.8 10*3/uL — ABNORMAL HIGH (ref 1.4–7.7)
Neutrophils Relative %: 56.8 % (ref 43.0–77.0)
Platelets: 380 10*3/uL (ref 150.0–400.0)
RBC: 4.47 Mil/uL (ref 3.87–5.11)
RDW: 19.5 % — ABNORMAL HIGH (ref 11.5–15.5)
WBC: 13.7 10*3/uL — ABNORMAL HIGH (ref 4.0–10.5)

## 2022-01-20 LAB — SEDIMENTATION RATE: Sed Rate: 44 mm/hr — ABNORMAL HIGH (ref 0–30)

## 2022-01-20 LAB — C-REACTIVE PROTEIN: CRP: <1 mg/dL (ref 0.5–20.0)

## 2022-01-20 MED ORDER — METHYLPREDNISOLONE 4 MG PO TABS: ORAL_TABLET | ORAL | 0 refills | Status: DC

## 2022-01-25 ENCOUNTER — Other Ambulatory Visit: Payer: Self-pay | Admitting: Family Medicine

## 2022-01-25 MED ORDER — METHYLPREDNISOLONE 4 MG PO TABS
ORAL_TABLET | ORAL | 0 refills | Status: DC
Start: 2022-01-25 — End: 2022-02-21

## 2022-02-02 DIAGNOSIS — M4726 Other spondylosis with radiculopathy, lumbar region: Secondary | ICD-10-CM | POA: Diagnosis not present

## 2022-02-03 DIAGNOSIS — L600 Ingrowing nail: Secondary | ICD-10-CM | POA: Diagnosis not present

## 2022-02-03 DIAGNOSIS — M792 Neuralgia and neuritis, unspecified: Secondary | ICD-10-CM | POA: Diagnosis not present

## 2022-02-15 DIAGNOSIS — M47816 Spondylosis without myelopathy or radiculopathy, lumbar region: Secondary | ICD-10-CM | POA: Diagnosis not present

## 2022-02-16 DIAGNOSIS — M170 Bilateral primary osteoarthritis of knee: Secondary | ICD-10-CM | POA: Diagnosis not present

## 2022-02-17 DIAGNOSIS — M792 Neuralgia and neuritis, unspecified: Secondary | ICD-10-CM | POA: Diagnosis not present

## 2022-02-20 DIAGNOSIS — M5416 Radiculopathy, lumbar region: Secondary | ICD-10-CM | POA: Diagnosis not present

## 2022-02-21 ENCOUNTER — Ambulatory Visit (INDEPENDENT_AMBULATORY_CARE_PROVIDER_SITE_OTHER): Payer: Medicare Other

## 2022-02-21 DIAGNOSIS — Z0000 Encounter for general adult medical examination without abnormal findings: Secondary | ICD-10-CM | POA: Diagnosis not present

## 2022-02-21 NOTE — Patient Instructions (Signed)
[name] , Thank you for taking time to come for your Medicare Wellness Visit. I appreciate your ongoing commitment to your health goals. Please review the following plan we discussed and let me know if I can assist you in the future.   These are the goals we discussed:  Goals      Patient Stated     Walk better and get back to normal living         This is a list of the screening recommended for you and due dates:  Health Maintenance  Topic Date Due   Yearly kidney health urinalysis for diabetes  Never done   Eye exam for diabetics  08/02/2018   Pneumonia Vaccine (2 - PCV) 04/19/2021   Colon Cancer Screening  09/06/2021   Complete foot exam   10/28/2021   Flu Shot  12/27/2021   Hemoglobin A1C  05/19/2022   Mammogram  11/04/2022   Yearly kidney function blood test for diabetes  01/21/2023   Tetanus Vaccine  04/19/2030   DEXA scan (bone density measurement)  Completed   Hepatitis C Screening: USPSTF Recommendation to screen - Ages 5-79 yo.  Completed   Zoster (Shingles) Vaccine  Completed   HPV Vaccine  Aged Out   COVID-19 Vaccine  Discontinued    Advanced directives: Please bring a copy of your health care power of attorney and living will to the office at your convenience.  Conditions/risks identified: get back to walking better   Next appointment: Follow up in one year for your annual wellness visit    Preventive Care 65 Years and Older, Female Preventive care refers to lifestyle choices and visits with your health care provider that can promote health and wellness. What does preventive care include? A yearly physical exam. This is also called an annual well check. Dental exams once or twice a year. Routine eye exams. Ask your health care provider how often you should have your eyes checked. Personal lifestyle choices, including: Daily care of your teeth and gums. Regular physical activity. Eating a healthy diet. Avoiding tobacco and drug use. Limiting alcohol  use. Practicing safe sex. Taking low-dose aspirin every day. Taking vitamin and mineral supplements as recommended by your health care provider. What happens during an annual well check? The services and screenings done by your health care provider during your annual well check will depend on your age, overall health, lifestyle risk factors, and family history of disease. Counseling  Your health care provider may ask you questions about your: Alcohol use. Tobacco use. Drug use. Emotional well-being. Home and relationship well-being. Sexual activity. Eating habits. History of falls. Memory and ability to understand (cognition). Work and work Statistician. Reproductive health. Screening  You may have the following tests or measurements: Height, weight, and BMI. Blood pressure. Lipid and cholesterol levels. These may be checked every 5 years, or more frequently if you are over 75 years old. Skin check. Lung cancer screening. You may have this screening every year starting at age 75 if you have a 30-pack-year history of smoking and currently smoke or have quit within the past 15 years. Fecal occult blood test (FOBT) of the stool. You may have this test every year starting at age 25. Flexible sigmoidoscopy or colonoscopy. You may have a sigmoidoscopy every 5 years or a colonoscopy every 10 years starting at age 42. Hepatitis C blood test. Hepatitis B blood test. Sexually transmitted disease (STD) testing. Diabetes screening. This is done by checking your blood sugar (glucose)  after you have not eaten for a while (fasting). You may have this done every 1-3 years. Bone density scan. This is done to screen for osteoporosis. You may have this done starting at age 52. Mammogram. This may be done every 1-2 years. Talk to your health care provider about how often you should have regular mammograms. Talk with your health care provider about your test results, treatment options, and if necessary,  the need for more tests. Vaccines  Your health care provider may recommend certain vaccines, such as: Influenza vaccine. This is recommended every year. Tetanus, diphtheria, and acellular pertussis (Tdap, Td) vaccine. You may need a Td booster every 10 years. Zoster vaccine. You may need this after age 31. Pneumococcal 13-valent conjugate (PCV13) vaccine. One dose is recommended after age 19. Pneumococcal polysaccharide (PPSV23) vaccine. One dose is recommended after age 56. Talk to your health care provider about which screenings and vaccines you need and how often you need them. This information is not intended to replace advice given to you by your health care provider. Make sure you discuss any questions you have with your health care provider. Document Released: 06/11/2015 Document Revised: 02/02/2016 Document Reviewed: 03/16/2015 Elsevier Interactive Patient Education  2017 [employer] Prevention in the Home Falls can cause injuries. They can happen to people of all ages. There are many things you can do to make your home safe and to help prevent falls. What can I do on the outside of my home? Regularly fix the edges of walkways and driveways and fix any cracks. Remove anything that might make you trip as you walk through a door, such as a raised step or threshold. Trim any bushes or trees on the path to your home. Use bright outdoor lighting. Clear any walking paths of anything that might make someone trip, such as rocks or tools. Regularly check to see if handrails are loose or broken. Make sure that both sides of any steps have handrails. Any raised decks and porches should have guardrails on the edges. Have any leaves, snow, or ice cleared regularly. Use sand or salt on walking paths during winter. Clean up any spills in your garage right away. This includes oil or grease spills. What can I do in the bathroom? Use night lights. Install grab bars by the toilet and in the  tub and shower. Do not use towel bars as grab bars. Use non-skid mats or decals in the tub or shower. If you need to sit down in the shower, use a plastic, non-slip stool. Keep the floor dry. Clean up any water that spills on the floor as soon as it happens. Remove soap buildup in the tub or shower regularly. Attach bath mats securely with double-sided non-slip rug tape. Do not have throw rugs and other things on the floor that can make you trip. What can I do in the bedroom? Use night lights. Make sure that you have a light by your bed that is easy to reach. Do not use any sheets or blankets that are too big for your bed. They should not hang down onto the floor. Have a firm chair that has side arms. You can use this for support while you get dressed. Do not have throw rugs and other things on the floor that can make you trip. What can I do in the kitchen? Clean up any spills right away. Avoid walking on wet floors. Keep items that you use a lot in easy-to-reach places. If  you need to reach something above you, use a strong step stool that has a grab bar. Keep electrical cords out of the way. Do not use floor polish or wax that makes floors slippery. If you must use wax, use non-skid floor wax. Do not have throw rugs and other things on the floor that can make you trip. What can I do with my stairs? Do not leave any items on the stairs. Make sure that there are handrails on both sides of the stairs and use them. Fix handrails that are broken or loose. Make sure that handrails are as long as the stairways. Check any carpeting to make sure that it is firmly attached to the stairs. Fix any carpet that is loose or worn. Avoid having throw rugs at the top or bottom of the stairs. If you do have throw rugs, attach them to the floor with carpet tape. Make sure that you have a light switch at the top of the stairs and the bottom of the stairs. If you do not have them, ask someone to add them for  you. What else can I do to help prevent falls? Wear shoes that: Do not have high heels. Have rubber bottoms. Are comfortable and fit you well. Are closed at the toe. Do not wear sandals. If you use a stepladder: Make sure that it is fully opened. Do not climb a closed stepladder. Make sure that both sides of the stepladder are locked into place. Ask someone to hold it for you, if possible. Clearly mark and make sure that you can see: Any grab bars or handrails. First and last steps. Where the edge of each step is. Use tools that help you move around (mobility aids) if they are needed. These include: Canes. Walkers. Scooters. Crutches. Turn on the lights when you go into a dark area. Replace any light bulbs as soon as they burn out. Set up your furniture so you have a clear path. Avoid moving your furniture around. If any of your floors are uneven, fix them. If there are any pets around you, be aware of where they are. Review your medicines with your doctor. Some medicines can make you feel dizzy. This can increase your chance of falling. Ask your doctor what other things that you can do to help prevent falls. This information is not intended to replace advice given to you by your health care provider. Make sure you discuss any questions you have with your health care provider. Document Released: 03/11/2009 Document Revised: 10/21/2015 Document Reviewed: 06/19/2014 Elsevier Interactive Patient Education  2017 [employer].

## 2022-02-21 NOTE — Progress Notes (Signed)
Virtual Visit via Telephone Note  I connected with  [name] on 02/21/22 at  1:45 PM EDT by telephone and verified that I am speaking with the correct person using two identifiers.  Medicare Annual Wellness visit completed telephonically due to Covid-19 pandemic.   Persons participating in this call: This Health Coach and this patient.   Location: Patient: home Provider: office   I discussed the limitations, risks, security and privacy concerns of performing an evaluation and management service by telephone and the availability of in person appointments. The patient expressed understanding and agreed to proceed.  Unable to perform video visit due to video visit attempted and failed and/or patient does not have video capability.   Some vital signs may be absent or patient reported.   Willette Brace, LPN   Subjective:   [name] is a 66 y.o. female who presents for Medicare Annual (Subsequent) preventive examination.  Review of Systems     Cardiac Risk Factors include: advanced age (>68men, >39 women);dyslipidemia;hypertension;diabetes mellitus;obesity (BMI >30kg/m2)     Objective:    There were no vitals filed for this visit. There is no height or weight on file to calculate BMI.     02/21/2022    1:52 PM 01/06/2022    8:22 PM 10/07/2021   12:19 PM 09/22/2021   12:30 AM 09/20/2021    9:09 AM 01/31/2021   10:19 AM 01/11/2019    9:33 PM  Advanced Directives  Does Patient Have a Medical Advance Directive? Yes No Yes Yes Yes Yes No  Type of Paramedic of [place];Living will  Healthcare Power of [place];Living will [place];Living will   Does patient want to make changes to medical advance directive?    No - Guardian declined No - Patient declined    Copy of [place] in Chart? No - copy requested     No - copy requested   Would patient like information on  creating a medical advance directive?       No - Patient declined    Current Medications (verified) Outpatient Encounter Medications as of 02/21/2022  Medication Sig   calcium carbonate (OSCAL) 1500 (600 Ca) MG TABS tablet Take 1,500 mg by mouth 2 (two) times daily with a meal.   cholecalciferol (VITAMIN D3) 25 MCG (1000 UNIT) tablet Take 1,000 Units by mouth daily.   Cyanocobalamin (B-12) 1000 MCG CAPS Take 1 capsule by mouth daily at 6 (six) AM.   famotidine (PEPCID) 40 MG tablet Take 1 tablet (40 mg total) by mouth daily.   furosemide (LASIX) 40 MG tablet TAKE 1 TABLET BY MOUTH  TWICE DAILY (Patient taking differently: Take 40 mg by mouth daily.)   hydrochlorothiazide (MICROZIDE) 12.5 MG capsule TAKE 1 CAPSULE BY MOUTH DAILY (Patient taking differently: Take 12.5 mg by mouth daily.)   HYDROcodone-acetaminophen (NORCO/VICODIN) 5-325 MG tablet Take 1 tablet by mouth every 6 (six) hours as needed for moderate pain.   Iron, Ferrous Sulfate, 325 (65 Fe) MG TABS Take 325 mg by mouth every other day. (Patient taking differently: Take 325 mg by mouth daily.)   metFORMIN (GLUCOPHAGE) 500 MG tablet TAKE 1 TABLET BY MOUTH  TWICE DAILY WITH A MEAL (Patient taking differently: Take 500 mg by mouth 2 (two) times daily with a meal.)   Multiple Vitamins-Minerals (MULTIVITAMIN & MINERAL PO) Take 1 tablet by mouth daily.   pregabalin (LYRICA) 50 MG capsule take 1-2 capsules  by mouth twice a day as instructed.   telmisartan (MICARDIS) 20 MG tablet TAKE 1 TABLET BY MOUTH DAILY (Patient taking differently: Take 20 mg by mouth daily.)   acetaminophen (TYLENOL) 650 MG CR tablet Take 1,300 mg by mouth every 8 (eight) hours as needed for pain. (Patient not taking: Reported on 02/21/2022)   [DISCONTINUED] albuterol (VENTOLIN HFA) 108 (90 Base) MCG/ACT inhaler Inhale 2 puffs into the lungs every 6 (six) hours as needed. (Patient taking differently: Inhale 2 puffs into the lungs every 6 (six) hours as needed for wheezing or  shortness of breath.)   [DISCONTINUED] buPROPion (WELLBUTRIN SR) 150 MG 12 hr tablet TAKE 1 TABLET BY MOUTH DAILY (Patient taking differently: Take 150 mg by mouth daily.)   [DISCONTINUED] cyclobenzaprine (FLEXERIL) 10 MG tablet Take 10 mg by mouth 3 (three) times daily as needed for muscle spasms.   [DISCONTINUED] diclofenac Sodium (VOLTAREN) 1 % GEL Apply 2 g topically 4 (four) times daily as needed (pain).   [DISCONTINUED] Ferrous Fumarate-Folic Acid (HEMOCYTE-F) 324-1 MG TABS Take 1 tablet by mouth daily.   [DISCONTINUED] fluticasone (FLONASE) 50 MCG/ACT nasal spray Place 2 sprays into both nostrils daily. (Patient taking differently: Place 2 sprays into both nostrils daily as needed for allergies.)   [DISCONTINUED] Insulin Pen Needle 32G X 6 MM MISC Use with Victoza daily   [DISCONTINUED] meloxicam (MOBIC) 15 MG tablet Take 15 mg by mouth daily.   [DISCONTINUED] methylPREDNISolone (MEDROL) 4 MG tablet 6 tabs po  x 3 days then 5 tabs po x 3 day then 4 tabs po x 3 day then 3 tabs po x 3 day then 2 tabs po x 3 day then 1 tab po x 3 day and stop   [DISCONTINUED] Oxycodone HCl 10 MG TABS Take 1 tablet (10 mg total) by mouth every 6 (six) hours as needed ((score 7 to 10)). (Patient not taking: Reported on 01/07/2022)   [DISCONTINUED] sertraline (ZOLOFT) 50 MG tablet TAKE ONE-HALF TABLET BY  MOUTH TWICE DAILY (Patient not taking: Reported on 01/07/2022)   [DISCONTINUED] tiZANidine (ZANAFLEX) 4 MG tablet Take 1 tablet (4 mg total) by mouth every 6 (six) hours as needed for muscle spasms. (Patient not taking: Reported on 01/07/2022)   No facility-administered encounter medications on file as of 02/21/2022.    Allergies (verified) Allopurinol, Mucinex [guaifenesin er], Penicillins, Prozac [fluoxetine hcl], Amitriptyline, and Lexapro [escitalopram oxalate]   History: Past Medical History:  Diagnosis Date   Allergic rhinitis    Allergy    Anemia 07/17/2016   Anxiety    Asthma    seasonal   Blood  transfusion without reported diagnosis    Breast cancer ([place])    right   Bronchitis    Colon polyps    CPAP (continuous positive airway pressure) dependence    Depression    Diabetes mellitus    borderline but takes metformin   Edema    Family history of breast cancer in first degree relative    Fibromyalgia    GERD (gastroesophageal reflux disease)    Hyperglycemia 03/05/2017   Hyperlipidemia 08/21/2016   no meds   Hyperparathyroidism    Hypertension    controlled by medications   Joint pain    Neuromuscular disorder (HCC)    Fibromyalgia   Obesity    Osteoarthritis    RA   PONV (postoperative nausea and vomiting)    occasional   Pre-diabetes    Sleep apnea    wears cpap   Viral meningitis  Vitamin D deficiency    Past Surgical History:  Procedure Laterality Date   ABDOMINAL HYSTERECTOMY  1998   ADENOIDECTOMY     BACK SURGERY     09/21/21   BREAST EXCISIONAL BIOPSY Left 1993   benign   BREAST SURGERY     Tran flap due to breast cancer   CESAREAN SECTION  1988   COLONOSCOPY  08/14/2011   HAND SURGERY     dog bite, right hand   HAND SURGERY     trauma, left hand   KNEE ARTHROSCOPY     bilateral   left knee replacement  2010   LUMBAR WOUND DEBRIDEMENT N/A 10/07/2021   Procedure: LUMBAR WOUND DEBRIDEMENT/REVISION;  Surgeon: Ashok Pall, MD;  Location: [facility];  Service: Neurosurgery;  Laterality: N/A;  3C/RM 21 to follow Dr Kathyrn Sheriff   MASTECTOMY  01/13/1994   Right breast   parathyroid resection     PARATHYROIDECTOMY     POLYPECTOMY     rectal abscess     SEPTOPLASTY  1980   SHOULDER ARTHROSCOPY Right 11/09/2015   Procedure: ARTHROSCOPY SHOULDER-acromioplasty, distal clavicle resection and debridement;  Surgeon: Melrose Nakayama, MD;  Location: [facility];  Service: Orthopedics;  Laterality: Right;   shoulder arthroscopy     rotator cuff repair   TOE SURGERY Right    paronychia and adenoma removed   TONSILLECTOMY     TOTAL KNEE ARTHROPLASTY  06/18/2008    [facility]   TUMOR REMOVAL  1982   , scalp   Family History  Problem Relation Age of Onset   Emphysema Father    Lung cancer Father        lung ca dx 59   Other Father        prostate issues   Diabetes Father    Breast cancer Maternal Aunt        dx late 58s   Colon cancer Maternal Aunt        dx 29s   Colon polyps Maternal Aunt    Prostate cancer Maternal Grandfather 40       d. 85y   Heart disease Paternal Grandfather    Heart attack Paternal Grandfather        d. 50y   Diabetes Paternal Grandfather    Asthma Daughter        "seasonal"   Arthritis Maternal Grandmother    Aneurysm Maternal Grandmother        d. brain aneurysm at 69   Arthritis Mother    Colon polyps Mother    Hypertension Mother    Sleep apnea Mother    Multiple sclerosis Sister    Breast cancer Sister 34       L IDC and DCIS; ER/PR+, Her2-   Fibrocystic breast disease Sister    Arthritis/Rheumatoid Sister    Breast cancer Sister    Cancer Maternal Uncle        d. mouth cancer at younger age; smoker   Other Paternal Uncle        muscle issues - couldn't walk or talk; d. 20y   Cancer Maternal Aunt        lymphoma, dx 58s   Ovarian cancer Maternal Aunt        dx 44s; d. late 37s   Lung cancer Maternal Uncle        d. 79y; former smoker   Throat cancer Cousin        maternal 1st cousin; smoker   Breast cancer Cousin 17  maternal 1st cousin   Other Paternal Uncle        prostate issues   Breast cancer Cousin        paternal 1st cousin dx late 52s   Throat cancer Cousin        maternal 1st cousin; used SL tobacco   Prostate cancer Cousin        maternal 1st cousin dx 20s   Esophageal cancer Neg Hx    Rectal cancer Neg Hx    Stomach cancer Neg Hx    Social History   Socioeconomic History   Marital status: Widowed    Spouse name: Not on file   Number of children: 1   Years of education: Not on file   Highest education level: 12th grade  Occupational History   Occupation: ACCOUNTING     Employer: [employer]  Tobacco Use   Smoking status: Never   Smokeless tobacco: Never  Vaping Use   Vaping Use: Never used  Substance and Sexual Activity   Alcohol use: Not Currently   Drug use: No   Sexual activity: Not Currently  Other Topics Concern   Not on file  Social History Narrative   Not on file   Social Determinants of Health   Financial Resource Strain: Low Risk  (02/21/2022)   Overall Financial Resource Strain (CARDIA)    Difficulty of Paying Living Expenses: Not hard at all  Food Insecurity: No Food Insecurity (02/21/2022)   Hunger Vital Sign    Worried About Running Out of Food in the Last Year: Never true    Ran Out of Food in the Last Year: Never true  Transportation Needs: No Transportation Needs (02/21/2022)   PRAPARE - Hydrologist (Medical): No    Lack of Transportation (Non-Medical): No  Physical Activity: Inactive (02/21/2022)   Exercise Vital Sign    Days of Exercise per Week: 0 days    Minutes of Exercise per Session: 0 min  Stress: No Stress Concern Present (02/21/2022)   [place]    Feeling of Stress : Not at all  Social Connections: Moderately Isolated (02/21/2022)   Social Connection and Isolation Panel [NHANES]    Frequency of Communication with Friends and Family: More than three times a week    Frequency of Social Gatherings with Friends and Family: Once a week    Attends Religious Services: 1 to 4 times per year    Active Member of [employer] or Organizations: No    Attends Archivist Meetings: Never    Marital Status: Widowed    Tobacco Counseling Counseling given: Not Answered   Clinical Intake:  Pre-visit preparation completed: Yes  Pain : No/denies pain     Nutritional Risks: None Diabetes: Yes CBG done?: No Did pt. bring in CBG monitor from home?: No  How often do you need to have someone help you when you read  instructions, pamphlets, or other written materials from your doctor or pharmacy?: 1 - Never  Diabetic?Nutrition Risk Assessment:  Has the patient had any N/V/D within the last 2 months?  No  Does the patient have any non-healing wounds?  No  Has the patient had any unintentional weight loss or weight gain?  No   Diabetes:  Is the patient diabetic?  Yes  If diabetic, was a CBG obtained today?  No  Did the patient bring in their glucometer from home?  No  How  often do you monitor your CBG's? As needed .   Financial Strains and Diabetes Management:  Are you having any financial strains with the device, your supplies or your medication? No .  Does the patient want to be seen by Chronic Care Management for management of their diabetes?  No  Would the patient like to be referred to a Nutritionist or for Diabetic Management?  No   Diabetic Exams:  Diabetic Eye Exam: Overdue for diabetic eye exam. Pt has been advised about the importance in completing this exam. Patient advised to call and schedule an eye exam. Diabetic Foot Exam: Overdue, Pt has been advised about the importance in completing this exam. Pt is scheduled for diabetic foot exam on next appt .   Interpreter Needed?: No  Information entered by :: Charlott Rakes, LPN   Activities of Daily Living    02/21/2022    1:53 PM 02/21/2022   12:02 AM  In your present state of health, do you have any difficulty performing the following activities:  Hearing? 0 0  Vision? 0 0  Difficulty concentrating or making decisions? 0 0  Walking or climbing stairs? 0 0  Dressing or bathing? 0 0  Doing errands, shopping? 0 0  Preparing Food and eating ? N N  Using the Toilet? N N  In the past six months, have you accidently leaked urine? N N  Do you have problems with loss of bowel control? N N  Managing your Medications? N N  Managing your Finances? N N  Housekeeping or managing your Housekeeping? N N    Patient Care Team: Mosie Lukes, MD as PCP - General (Family Medicine) Marylynn Pearson, MD as Consulting Physician (Obstetrics and Gynecology)  Indicate any recent Medical Services you may have received from other than [facility] providers in the past year (date may be approximate).     Assessment:   This is a routine wellness examination for [name].  Hearing/Vision screen Hearing Screening - Comments:: Pt denies any hearing issues  Vision Screening - Comments:: Pt follows up with [facility] for annual eye exams   Dietary issues and exercise activities discussed: Current Exercise Habits: The patient does not participate in regular exercise at present   Goals Addressed             This Visit's Progress    Patient Stated       Walk better and get back to normal living        Depression Screen    02/21/2022    1:48 PM 01/10/2022    3:56 PM 11/21/2021    8:32 AM 05/02/2021   10:58 AM 10/28/2020    8:40 AM 06/17/2020    8:39 AM 07/21/2019    7:51 AM  PHQ 2/9 Scores  PHQ - 2 Score 0 1 0 0 2 0 2  PHQ- 9 Score 0 _0 0 12  Exception Documentation       Medical reason    Fall Risk    02/21/2022    1:53 PM 02/21/2022   12:02 AM 01/10/2022    3:55 PM 11/21/2021    8:32 AM 10/28/2020    8:46 AM  Fall Risk   Falls in the past year? 0 0 0 0 1  Number falls in past yr: 0  0 0 0  Injury with Fall? 0  0 0 0  Risk for fall due to : No Fall Risks;Impaired vision  Follow up Falls prevention discussed        FALL RISK PREVENTION PERTAINING TO THE HOME:  Any stairs in or around the home? No  If so, are there any without handrails? No  Home free of loose throw rugs in walkways, pet beds, electrical cords, etc? Yes  Adequate lighting in your home to reduce risk of falls? Yes   ASSISTIVE DEVICES UTILIZED TO PREVENT FALLS:  Life alert? No  Use of a cane, walker or w/c? No  Grab bars in the bathroom? Yes  Shower chair or bench in shower? Yes  Elevated toilet seat or a handicapped toilet? No   TIMED UP  AND GO:  Was the test performed? No .   Cognitive Function:        02/21/2022    1:54 PM  6CIT Screen  What Year? 0 points  What month? 0 points  What time? 0 points  Count back from 20 0 points  Months in reverse 0 points  Repeat phrase 0 points  Total Score 0 points    Immunizations Immunization History  Administered Date(s) Administered   Influenza Inj Mdck Quad Pf 03/19/2018   Influenza Split 03/27/2011, 03/29/2012   Influenza Whole 04/07/2009, 03/28/2010   Influenza,inj,Quad PF,6+ Mos 02/26/2013, 01/28/2016, 03/05/2017   Influenza-Unspecified 02/10/2019, 02/10/2020   PFIZER(Purple Top)SARS-COV-2 Vaccination 08/11/2019   Pneumococcal Polysaccharide-23 04/03/2014, 04/19/2020   Td 04/29/2009   Tdap 04/19/2020   Zoster Recombinat (Shingrix) 08/08/2017, 10/08/2017   Zoster, Live 01/28/2016    TDAP status: Up to date  Flu Vaccine status: Due, Education has been provided regarding the importance of this vaccine. Advised may receive this vaccine at local pharmacy or Health Dept. Aware to provide a copy of the vaccination record if obtained from local pharmacy or Health Dept. Verbalized acceptance and understanding.  Pneumococcal vaccine status: Due, Education has been provided regarding the importance of this vaccine. Advised may receive this vaccine at local pharmacy or Health Dept. Aware to provide a copy of the vaccination record if obtained from local pharmacy or Health Dept. Verbalized acceptance and understanding.  Covid-19 vaccine status: Completed vaccines  Qualifies for Shingles Vaccine? Yes   Zostavax completed Yes   Shingrix Completed?: Yes  Screening Tests Health Maintenance  Topic Date Due   Diabetic kidney evaluation - Urine ACR  Never done   OPHTHALMOLOGY EXAM  08/02/2018   Pneumonia Vaccine 62+ Years old (2 - PCV) 04/19/2021   COLONOSCOPY (Pts 45-67yr Insurance coverage will need to be confirmed)  09/06/2021   FOOT EXAM  10/28/2021   INFLUENZA  VACCINE  12/27/2021   HEMOGLOBIN A1C  05/19/2022   MAMMOGRAM  11/04/2022   Diabetic kidney evaluation - GFR measurement  01/21/2023   TETANUS/TDAP  04/19/2030   DEXA SCAN  Completed   Hepatitis C Screening  Completed   Zoster Vaccines- Shingrix  Completed   HPV VACCINES  Aged Out   COVID-19 Vaccine  Discontinued    Health Maintenance  Health Maintenance Due  Topic Date Due   Diabetic kidney evaluation - Urine ACR  Never done   OPHTHALMOLOGY EXAM  08/02/2018   Pneumonia Vaccine 67 Years old (2 - PCV) 04/19/2021   COLONOSCOPY (Pts 45-467yrInsurance coverage will need to be confirmed)  09/06/2021   FOOT EXAM  10/28/2021   INFLUENZA VACCINE  12/27/2021    Colorectal cancer screening: Type of screening: Colonoscopy. Completed 09/06/16. Repeat every 5 years  Mammogram status: Completed 11/03/21. Repeat every year  Bone Density status: Completed 09/02/20.  Results reflect: Bone density results: NORMAL. Repeat every 2 years.   Additional Screening:  Hepatitis C Screening:  Completed 10/28/20  Vision Screening: Recommended annual ophthalmology exams for early detection of glaucoma and other disorders of the eye. Is the patient up to date with their annual eye exam?  Yes  Who is the provider or what is the name of the office in which the patient attends annual eye exams? [facility]  If pt is not established with a provider, would they like to be referred to a provider to establish care? No .   Dental Screening: Recommended annual dental exams for proper oral hygiene  Community Resource Referral / Chronic Care Management: CRR required this visit?  No   CCM required this visit?  No      Plan:     I have personally reviewed and noted the following in the patient's chart:   Medical and social history Use of alcohol, tobacco or illicit drugs  Current medications and supplements including opioid prescriptions. Patient is currently taking opioid prescriptions. Information provided to  patient regarding non-opioid alternatives. Patient advised to discuss non-opioid treatment plan with their provider. Functional ability and status Nutritional status Physical activity Advanced directives List of other physicians Hospitalizations, surgeries, and ER visits in previous 12 months Vitals Screenings to include cognitive, depression, and falls Referrals and appointments  In addition, I have reviewed and discussed with patient certain preventive protocols, quality metrics, and best practice recommendations. A written personalized care plan for preventive services as well as general preventive health recommendations were provided to patient.     Willette Brace, LPN   [id number]   Nurse Notes: none

## 2022-02-27 ENCOUNTER — Other Ambulatory Visit: Payer: Self-pay | Admitting: Family Medicine

## 2022-03-07 DIAGNOSIS — M461 Sacroiliitis, not elsewhere classified: Secondary | ICD-10-CM | POA: Diagnosis not present

## 2022-03-09 ENCOUNTER — Encounter: Payer: Self-pay | Admitting: Family Medicine

## 2022-03-10 ENCOUNTER — Other Ambulatory Visit: Payer: Self-pay | Admitting: Family Medicine

## 2022-03-10 DIAGNOSIS — M5441 Lumbago with sciatica, right side: Secondary | ICD-10-CM

## 2022-03-10 DIAGNOSIS — M4316 Spondylolisthesis, lumbar region: Secondary | ICD-10-CM

## 2022-03-13 ENCOUNTER — Encounter: Payer: Self-pay | Admitting: Family Medicine

## 2022-03-13 ENCOUNTER — Other Ambulatory Visit: Payer: Self-pay | Admitting: Family Medicine

## 2022-03-13 DIAGNOSIS — M5441 Lumbago with sciatica, right side: Secondary | ICD-10-CM

## 2022-03-20 DIAGNOSIS — M5451 Vertebrogenic low back pain: Secondary | ICD-10-CM | POA: Diagnosis not present

## 2022-03-21 ENCOUNTER — Encounter: Payer: Self-pay | Admitting: Family Medicine

## 2022-03-22 ENCOUNTER — Ambulatory Visit (INDEPENDENT_AMBULATORY_CARE_PROVIDER_SITE_OTHER): Payer: Medicare Other | Admitting: Family

## 2022-03-22 VITALS — BP 130/76 | HR 95 | Temp 98.5°F | Resp 16 | Wt 290.0 lb

## 2022-03-22 DIAGNOSIS — J208 Acute bronchitis due to other specified organisms: Secondary | ICD-10-CM | POA: Diagnosis not present

## 2022-03-22 MED ORDER — ALBUTEROL SULFATE HFA 108 (90 BASE) MCG/ACT IN AERS: 2.0000 | INHALATION_SPRAY | Freq: Four times a day (QID) | RESPIRATORY_TRACT | 0 refills | Status: DC | PRN

## 2022-03-22 MED ORDER — PREDNISONE 10 MG PO TABS: ORAL_TABLET | ORAL | 0 refills | Status: DC

## 2022-03-22 NOTE — Progress Notes (Signed)
Subjective:   By signing my name below, I, Shehryar Baig, attest that this documentation has been prepared under the direction and in the presence of Debbrah Alar, NP. 03/22/2022    Patient ID: [name], female    DOB: 02/06/1956, 66 y.o.   MRN: [id number]  Chief Complaint  Patient presents with   Nasal Congestion    Complains of nasal congestion   Sore Throat    Complains of sore throat since sunday   Wheezing    Complains of wheezing    HPI Patient is in today for a office visit.   Cough, sore throat- She complains of sore throat since Sunday. Since then she developed cough, congestion. She typically gets similar symptoms 2x a year. She denies having any fever or body aches. She has difficulty sleeping at night due to her cough and is requesting cough syrup to manage her symptoms. She recovered from Covid-19 two months ago. She has not tested for Covid-19 yet. She does not check her blood sugars regularly at home but is willing to check them regularly while on prednisone.   Leg cramping- She reports her leg cramping last night. During her episode her feet turned to one side and she could not move them until the cramping ended.    Past Medical History:  Diagnosis Date   Allergic rhinitis    Allergy    Anemia 07/17/2016   Anxiety    Asthma    seasonal   Blood transfusion without reported diagnosis    Breast cancer ([place])    right   Bronchitis    Colon polyps    CPAP (continuous positive airway pressure) dependence    Depression    Diabetes mellitus    borderline but takes metformin   Edema    Family history of breast cancer in first degree relative    Fibromyalgia    GERD (gastroesophageal reflux disease)    Hyperglycemia 03/05/2017   Hyperlipidemia 08/21/2016   no meds   Hyperparathyroidism    Hypertension    controlled by medications   Joint pain    Neuromuscular disorder ([place])    Fibromyalgia   Obesity    Osteoarthritis    RA   PONV  (postoperative nausea and vomiting)    occasional   Pre-diabetes    Sleep apnea    wears cpap   Viral meningitis    Vitamin D deficiency     Past Surgical History:  Procedure Laterality Date   ABDOMINAL HYSTERECTOMY  1998   ADENOIDECTOMY     BACK SURGERY     09/21/21   BREAST EXCISIONAL BIOPSY Left 1993   benign   BREAST SURGERY     Tran flap due to breast cancer   CESAREAN SECTION  1988   COLONOSCOPY  08/14/2011   HAND SURGERY     dog bite, right hand   HAND SURGERY     trauma, left hand   KNEE ARTHROSCOPY     bilateral   left knee replacement  2010   LUMBAR WOUND DEBRIDEMENT N/A 10/07/2021   Procedure: LUMBAR WOUND DEBRIDEMENT/REVISION;  Surgeon: Ashok Pall, MD;  Location: [facility];  Service: Neurosurgery;  Laterality: N/A;  3C/RM 21 to follow Dr Kathyrn Sheriff   MASTECTOMY  01/13/1994   Right breast   parathyroid resection     PARATHYROIDECTOMY     POLYPECTOMY     rectal abscess     SEPTOPLASTY  1980   SHOULDER ARTHROSCOPY Right 11/09/2015   Procedure:  ARTHROSCOPY SHOULDER-acromioplasty, distal clavicle resection and debridement;  Surgeon: Melrose Nakayama, MD;  Location: [facility];  Service: Orthopedics;  Laterality: Right;   shoulder arthroscopy     rotator cuff repair   TOE SURGERY Right    paronychia and adenoma removed   TONSILLECTOMY     TOTAL KNEE ARTHROPLASTY  06/18/2008   D[facility]orf   TUMOR REMOVAL  1982   , scalp    Family History  Problem Relation Age of Onset   Emphysema Father    Lung cancer Father        lung ca dx 12   Other Father        prostate issues   Diabetes Father    Breast cancer Maternal Aunt        dx late 26s   Colon cancer Maternal Aunt        dx 26s   Colon polyps Maternal Aunt    Prostate cancer Maternal Grandfather 79       d. 85y   Heart disease Paternal Grandfather    Heart attack Paternal Grandfather        d. 50y   Diabetes Paternal Grandfather    Asthma Daughter        "seasonal"   Arthritis Maternal Grandmother     Aneurysm Maternal Grandmother        d. brain aneurysm at 45   Arthritis Mother    Colon polyps Mother    Hypertension Mother    Sleep apnea Mother    Multiple sclerosis Sister    Breast cancer Sister 87       L IDC and DCIS; ER/PR+, Her2-   Fibrocystic breast disease Sister    Arthritis/Rheumatoid Sister    Breast cancer Sister    Cancer Maternal Uncle        d. mouth cancer at younger age; smoker   Other Paternal Uncle        muscle issues - couldn't walk or talk; d. 20y   Cancer Maternal Aunt        lymphoma, dx 13s   Ovarian cancer Maternal Aunt        dx 41s; d. late 21s   Lung cancer Maternal Uncle        d. 23y; former smoker   Throat cancer Cousin        maternal 1st cousin; smoker   Breast cancer Cousin 78       maternal 1st cousin   Other Paternal Uncle        prostate issues   Breast cancer Cousin        paternal 1st cousin dx late 13s   Throat cancer Cousin        maternal 1st cousin; used SL tobacco   Prostate cancer Cousin        maternal 1st cousin dx 43s   Esophageal cancer Neg Hx    Rectal cancer Neg Hx    Stomach cancer Neg Hx     Social History   Socioeconomic History   Marital status: Widowed    Spouse name: Not on file   Number of children: 1   Years of education: Not on file   Highest education level: 12th grade  Occupational History   Occupation: ACCOUNTING    Employer: [employer]  Tobacco Use   Smoking status: Never   Smokeless tobacco: Never  Vaping Use   Vaping Use: Never used  Substance and Sexual Activity   Alcohol use: Not Currently  Drug use: No   Sexual activity: Not Currently  Other Topics Concern   Not on file  Social History Narrative   Not on file   Social Determinants of Health   Financial Resource Strain: Low Risk  (02/21/2022)   Overall Financial Resource Strain (CARDIA)    Difficulty of Paying Living Expenses: Not hard at all  Food Insecurity: No Food Insecurity (02/21/2022)   Hunger Vital Sign     Worried About Running Out of Food in the Last Year: Never true    Ran Out of Food in the Last Year: Never true  Transportation Needs: No Transportation Needs (02/21/2022)   PRAPARE - Hydrologist (Medical): No    Lack of Transportation (Non-Medical): No  Physical Activity: Inactive (02/21/2022)   Exercise Vital Sign    Days of Exercise per Week: 0 days    Minutes of Exercise per Session: 0 min  Stress: No Stress Concern Present (02/21/2022)   [place]    Feeling of Stress : Not at all  Social Connections: Moderately Isolated (02/21/2022)   Social Connection and Isolation Panel [NHANES]    Frequency of Communication with Friends and Family: More than three times a week    Frequency of Social Gatherings with Friends and Family: Once a week    Attends Religious Services: 1 to 4 times per year    Active Member of [employer] or Organizations: No    Attends Archivist Meetings: Never    Marital Status: Widowed  Intimate Partner Violence: Not At Risk (02/21/2022)   Humiliation, Afraid, Rape, and Kick questionnaire    Fear of Current or Ex-Partner: No    Emotionally Abused: No    Physically Abused: No    Sexually Abused: No    Outpatient Medications Prior to Visit  Medication Sig Dispense Refill   acetaminophen (TYLENOL) 650 MG CR tablet Take 1,300 mg by mouth every 8 (eight) hours as needed for pain.     calcium carbonate (OSCAL) 1500 (600 Ca) MG TABS tablet Take 1,500 mg by mouth 2 (two) times daily with a meal.     cholecalciferol (VITAMIN D3) 25 MCG (1000 UNIT) tablet Take 1,000 Units by mouth daily.     Cyanocobalamin (B-12) 1000 MCG CAPS Take 1 capsule by mouth daily at 6 (six) AM.     famotidine (PEPCID) 40 MG tablet Take 1 tablet (40 mg total) by mouth daily. 30 tablet 5   furosemide (LASIX) 40 MG tablet TAKE 1 TABLET BY MOUTH  TWICE DAILY (Patient taking differently: Take 40 mg by  mouth daily.) 180 tablet 3   hydrochlorothiazide (MICROZIDE) 12.5 MG capsule TAKE 1 CAPSULE BY MOUTH DAILY (Patient taking differently: Take 12.5 mg by mouth daily.) 90 capsule 3   Iron, Ferrous Sulfate, 325 (65 Fe) MG TABS Take 325 mg by mouth every other day. (Patient taking differently: Take 325 mg by mouth daily.) 30 tablet    metFORMIN (GLUCOPHAGE) 500 MG tablet TAKE 1 TABLET BY MOUTH  TWICE DAILY WITH A MEAL (Patient taking differently: Take 500 mg by mouth 2 (two) times daily with a meal.) 180 tablet 3   Multiple Vitamins-Minerals (MULTIVITAMIN & MINERAL PO) Take 1 tablet by mouth daily.     pregabalin (LYRICA) 50 MG capsule take 1-2 capsules by mouth twice a day as instructed.     telmisartan (MICARDIS) 20 MG tablet TAKE 1 TABLET BY MOUTH DAILY (Patient taking differently: Take  20 mg by mouth daily.) 90 tablet 3   HYDROcodone-acetaminophen (NORCO/VICODIN) 5-325 MG tablet Take 1 tablet by mouth every 6 (six) hours as needed for moderate pain.     No facility-administered medications prior to visit.    Allergies  Allergen Reactions   Allopurinol Hives   Mucinex [Guaifenesin Er] Shortness Of Breath   Penicillins Hives and Other (See Comments)    Has patient had a PCN reaction causing immediate rash, facial/tongue/throat swelling, SOB or lightheadedness with hypotension: no Has patient had a PCN reaction causing severe rash involving mucus membranes or skin necrosis: no Has patient had a PCN reaction that required hospitalization no Has patient had a PCN reaction occurring within the last 10 years: no If all of the above answers are "NO", then may proceed with Cephalosporin use.    Prozac [Fluoxetine Hcl] Hives   Amitriptyline Other (See Comments)    "felt weird, fatigue, dizziness"   Lexapro [Escitalopram Oxalate]     Nausea and hypersalivation.     Review of Systems  Constitutional:  Negative for fever.  HENT:  Positive for congestion and sore throat.   Respiratory:  Positive  for cough.   Musculoskeletal:        (-)body aches (+)leg cramp       Objective:    Physical Exam Constitutional:      General: She is not in acute distress.    Appearance: Normal appearance. She is not ill-appearing.  HENT:     Head: Normocephalic and atraumatic.     Right Ear: Tympanic membrane, ear canal and external ear normal.     Left Ear: Tympanic membrane, ear canal and external ear normal.     Mouth/Throat:     Mouth: Mucous membranes are moist.     Pharynx: Oropharynx is clear. No oropharyngeal exudate or posterior oropharyngeal erythema.  Eyes:     Extraocular Movements: Extraocular movements intact.     Pupils: Pupils are equal, round, and reactive to light.  Cardiovascular:     Rate and Rhythm: Normal rate and regular rhythm.     Heart sounds: Normal heart sounds. No murmur heard.    No gallop.  Pulmonary:     Effort: Pulmonary effort is normal. No respiratory distress.     Breath sounds: Wheezing (expiratory wheeze) present. No rales.     Comments: Coarse cough noted Lymphadenopathy:     Cervical: No cervical adenopathy.  Skin:    General: Skin is warm and dry.  Neurological:     Mental Status: She is alert and oriented to person, place, and time.  Psychiatric:        Judgment: Judgment normal.     BP 130/76 (BP Location: Left Arm, Patient Position: Sitting, Cuff Size: Large)   Pulse 95   Temp 98.5 F (36.9 C) (Oral)   Resp 16   Wt 290 lb (131.5 kg)   SpO2 98%   BMI 46.81 kg/m  Wt Readings from Last 3 Encounters:  03/22/22 290 lb (131.5 kg)  01/10/22 284 lb 9.6 oz (129.1 kg)  11/21/21 284 lb (128.8 kg)       Assessment & Plan:   Problem List Items Addressed This Visit       Unprioritized   Viral bronchitis - Primary    New. Will rx with prednisone taper (pt advised to check sugars once daily while on prednisone and call if sugars >300).  Rx with albuterol MDI q6 hrs prn.  Delsym prn cough. She is advised  to call if symptoms worsen or if  symptoms are not improved in 3-4 days.         Meds ordered this encounter  Medications   predniSONE (DELTASONE) 10 MG tablet    Sig: 4 tabs by mouth once daily for 2 days, then 3 tabs daily x 2 days, then 2 tabs daily x 2 days, then 1 tab daily x 2 days    Dispense:  20 tablet    Refill:  0    Order Specific Question:   Supervising Provider    Answer:   Penni Homans A [[id number]]   albuterol (VENTOLIN HFA) 108 (90 Base) MCG/ACT inhaler    Sig: Inhale 2 puffs into the lungs every 6 (six) hours as needed for wheezing or shortness of breath.    Dispense:  8 g    Refill:  0    Order Specific Question:   Supervising Provider    Answer:   Penni Homans A [[id number]]    I, Nance Pear, NP, personally preformed the services described in this documentation.  All medical record entries made by the scribe were at my direction and in my presence.  I have reviewed the chart and discharge instructions (if applicable) and agree that the record reflects my personal performance and is accurate and complete. 03/22/2022   I,Shehryar Baig,acting as a Education administrator for Nance Pear, NP.,have documented all relevant documentation on the behalf of Nance Pear, NP,as directed by  Nance Pear, NP while in the presence of Nance Pear, NP.   Nance Pear, NP

## 2022-03-22 NOTE — Assessment & Plan Note (Signed)
New. Will rx with prednisone taper (pt advised to check sugars once daily while on prednisone and call if sugars >300).  Rx with albuterol MDI q6 hrs prn.  Delsym prn cough. She is advised to call if symptoms worsen or if symptoms are not improved in 3-4 days.

## 2022-03-24 NOTE — Therapy (Signed)
OUTPATIENT PHYSICAL THERAPY THORACOLUMBAR EVALUATION   Patient Name: [name] MRN: [id number] DOB:08-22-1955, 66 y.o., female Today's Date: 03/24/2022    Past Medical History:  Diagnosis Date   Allergic rhinitis    Allergy    Anemia 07/17/2016   Anxiety    Asthma    seasonal   Blood transfusion without reported diagnosis    Breast cancer ([place])    right   Bronchitis    Colon polyps    CPAP (continuous positive airway pressure) dependence    Depression    Diabetes mellitus    borderline but takes metformin   Edema    Family history of breast cancer in first degree relative    Fibromyalgia    GERD (gastroesophageal reflux disease)    Hyperglycemia 03/05/2017   Hyperlipidemia 08/21/2016   no meds   Hyperparathyroidism    Hypertension    controlled by medications   Joint pain    Neuromuscular disorder ([place])    Fibromyalgia   Obesity    Osteoarthritis    RA   PONV (postoperative nausea and vomiting)    occasional   Pre-diabetes    Sleep apnea    wears cpap   Viral meningitis    Vitamin D deficiency    Past Surgical History:  Procedure Laterality Date   [place] SURGERY     09/21/21   BREAST EXCISIONAL BIOPSY Left 1993   benign   BREAST SURGERY     Tran flap due to breast cancer   CESAREAN SECTION  1988   COLONOSCOPY  08/14/2011   HAND SURGERY     dog bite, right hand   HAND SURGERY     trauma, left hand   KNEE ARTHROSCOPY     bilateral   left knee replacement  2010   LUMBAR WOUND DEBRIDEMENT N/A 10/07/2021   Procedure: LUMBAR WOUND DEBRIDEMENT/REVISION;  Surgeon: Ashok Pall, MD;  Location: [facility];  Service: Neurosurgery;  Laterality: N/A;  3C/RM 21 to follow Dr Kathyrn Sheriff   MASTECTOMY  01/13/1994   Right breast   parathyroid resection     PARATHYROIDECTOMY     POLYPECTOMY     rectal abscess     SEPTOPLASTY  1980   SHOULDER ARTHROSCOPY Right 11/09/2015   Procedure: ARTHROSCOPY  SHOULDER-acromioplasty, distal clavicle resection and debridement;  Surgeon: Melrose Nakayama, MD;  Location: [facility];  Service: Orthopedics;  Laterality: Right;   shoulder arthroscopy     rotator cuff repair   TOE SURGERY Right    paronychia and adenoma removed   TONSILLECTOMY     TOTAL KNEE ARTHROPLASTY  06/18/2008   Daldorf   TUMOR REMOVAL  1982   , scalp   Patient Active Problem List   Diagnosis Date Noted   Viral bronchitis 03/22/2022   Low back pain 01/11/2022   Drainage from wound 10/07/2021   Spondylolisthesis of lumbar region 09/21/2021   DDD (degenerative disc disease), cervical 05/04/2021   Neck pain 05/02/2021   Left arm pain 01/21/2021   Memory changes 04/19/2020   Educated about COVID-19 virus infection 04/19/2020   Peripheral neuropathy 04/19/2020   TMJ arthralgia 04/19/2020   Urinary incontinence 10/27/2019   Other peripheral vertigo, unspecified ear 10/27/2019   Vertigo 03/14/2018   RLS (restless legs syndrome) 06/07/2017   Right knee pain 12/06/2016   Arthralgia 12/03/2016   Hyperlipidemia 08/21/2016   Anemia 07/17/2016   Genetic testing 05/15/2016   Allergic urticaria 03/27/2016  Dermographia 03/27/2016   History of breast cancer 03/27/2016   Rheumatoid arthritis ([place]) 06/24/2015   Diverticulitis of colon without hemorrhage 09/21/2014   Asthma with acute exacerbation 07/14/2014   Cough 07/06/2014   Gout of big toe 03/10/2013   Preventative health care 10/08/2012   OSA (obstructive sleep apnea) 04/23/2012   Diabetes mellitus, type 2 ([place]) 12/10/2011   Diarrhea 09/04/2011   Personal history of colonic polyps - adenoma 08/14/2011   Fatigue 08/03/2011   Ear canal dryness 08/03/2011   Palpitations 09/21/2010   Morbid obesity ([place]) 09/21/2010   PAC (premature atrial contraction) 09/21/2010   ATTENTION DEFICIT DISORDER, INATTENTIVE TYPE 09/22/2009   Acute sinusitis 09/22/2009   VIRAL MENINGITIS, HX OF 04/29/2009   Insomnia 02/15/2009   Vitamin D  deficiency 03/11/2008   Fibromyalgia 03/05/2008   HYPERPARATHYROIDISM, HX OF 08/06/2007   Allergic rhinitis 07/05/2007   Osteoarthritis 02/28/2007   Edema 11/01/2006   Depression with anxiety 06/25/2006   Essential hypertension 06/25/2006   Gastroesophageal reflux disease without esophagitis 06/25/2006    PCP: Mosie Lukes, MD  REFERRING PROVIDER: Mosie Lukes, MD  REFERRING DIAG: M54.41 (ICD-10-CM) - Right-sided low back pain with right-sided sciatica, unspecified chronicity  Rationale for Evaluation and Treatment: Rehabilitation  THERAPY DIAG:  No diagnosis found.  ONSET DATE: ***  SUBJECTIVE:                                                                                                                                                                                           SUBJECTIVE STATEMENT: ***  PERTINENT HISTORY:  Diabetes, Osteoarthritis, Anxiety, GERD, R shoulder replacement (10/2015), B TKA, Back surgery (4/23)  PAIN:  Are you having pain? {OPRCPAIN:27236}  PRECAUTIONS: {Therapy precautions:24002}  WEIGHT BEARING RESTRICTIONS: {Yes ***/No:24003}  FALLS:  Has patient fallen in last 6 months? {fallsyesno:27318}  LIVING ENVIRONMENT: Lives with: {OPRC lives with:25569::"lives with their family"} Lives in: {Lives in:25570} Stairs: {opstairs:27293} Has following equipment at home: {Assistive devices:23999}  OCCUPATION: ***  PLOF: {PLOF:24004}  PATIENT GOALS: ***   OBJECTIVE:   DIAGNOSTIC FINDINGS:  MRI Lumbar Spine w/o Contrast: 01/07/22  IMPRESSION: 1. Postsurgical changes from L3-L5 PLIF. Bone marrow signal changes within the spinous processes and bilateral lamina of L2, L3, and L4 raise the suspicion for osteomyelitis. 2. Previously described T2 hyperintense material along the right lateral and dorsal epidural space at the L2 and L2-3 levels is not seen on the current study. No IV contrast was administered on the current exam. No new  epidural space abnormality is identified. 3. Similar degree of canal stenosis at L2-3.  CT Lumbar Spine w/o Contrast: 01/07/22 IMPRESSION: 1. No  CT evidence for acute abnormality within the lumbar spine. 2. Postoperative changes from prior PLIF at L3-4 and L4-5. Mild subsidence at L3-4. No other hardware complication. 3. Underlying degenerative spondylosis and facet arthrosis, better appreciated and described on recent MRIs.  PATIENT SURVEYS:  {rehab surveys:24030}  SCREENING FOR RED FLAGS: Bowel or bladder incontinence: {Yes/No:304960894} Spinal tumors: {Yes/No:304960894} Cauda equina syndrome: {Yes/No:304960894} Compression fracture: {Yes/No:304960894} Abdominal aneurysm: {Yes/No:304960894}  COGNITION: Overall cognitive status: {cognition:24006}     SENSATION: {sensation:27233}  MUSCLE LENGTH: Hamstrings: Right *** deg; Left *** deg Thomas test: Right *** deg; Left *** deg  POSTURE: {posture:25561}  PALPATION: ***  LUMBAR ROM:   AROM eval  Flexion   Extension   Right lateral flexion   Left lateral flexion   Right rotation   Left rotation    (Blank rows = not tested)  LOWER EXTREMITY ROM:     {AROM/PROM:27142}  Right eval Left eval  Hip flexion    Hip extension    Hip abduction    Hip adduction    Hip internal rotation    Hip external rotation    Knee flexion    Knee extension    Ankle dorsiflexion    Ankle plantarflexion    Ankle inversion    Ankle eversion     (Blank rows = not tested)  LOWER EXTREMITY MMT:    MMT Right eval Left eval  Hip flexion    Hip extension    Hip abduction    Hip adduction    Hip internal rotation    Hip external rotation    Knee flexion    Knee extension    Ankle dorsiflexion    Ankle plantarflexion    Ankle inversion    Ankle eversion     (Blank rows = not tested)  LUMBAR SPECIAL TESTS:  {lumbar special test:25242}  FUNCTIONAL TESTS:  {Functional tests:24029}  GAIT: Distance walked: *** Assistive  device utilized: {Assistive devices:23999} Level of assistance: {Levels of assistance:24026} Comments: ***  TODAY'S TREATMENT:                                                                                                                              DATE: ***    PATIENT EDUCATION:  Education details: *** Person educated: {Person educated:25204} Education method: {Education Method:25205} Education comprehension: {Education Comprehension:25206}  HOME EXERCISE PROGRAM: ***  ASSESSMENT:  CLINICAL IMPRESSION: Patient is a *** y.o. *** who was seen today for physical therapy evaluation and treatment for ***.   OBJECTIVE IMPAIRMENTS: {opptimpairments:25111}.   ACTIVITY LIMITATIONS: {activitylimitations:27494}  PARTICIPATION LIMITATIONS: {participationrestrictions:25113}  PERSONAL FACTORS: {Personal factors:25162} are also affecting patient's functional outcome.   REHAB POTENTIAL: {rehabpotential:25112}  CLINICAL DECISION MAKING: {clinical decision making:25114}  EVALUATION COMPLEXITY: {Evaluation complexity:25115}   GOALS: Goals reviewed with patient? {yes/no:20286}  SHORT TERM GOALS: Target date: {follow up:25551}  *** Baseline: Goal status: {GOALSTATUS:25110}  2.  *** Baseline:  Goal status: {GOALSTATUS:25110}  3.  *** Baseline:  Goal  status: {GOALSTATUS:25110}  4.  *** Baseline:  Goal status: {GOALSTATUS:25110}  5.  *** Baseline:  Goal status: {GOALSTATUS:25110}  6.  *** Baseline:  Goal status: {GOALSTATUS:25110}  LONG TERM GOALS: Target date: {follow up:25551}  *** Baseline:  Goal status: {GOALSTATUS:25110}  2.  *** Baseline:  Goal status: {GOALSTATUS:25110}  3.  *** Baseline:  Goal status: {GOALSTATUS:25110}  4.  *** Baseline:  Goal status: {GOALSTATUS:25110}  5.  *** Baseline:  Goal status: {GOALSTATUS:25110}  6.  *** Baseline:  Goal status: {GOALSTATUS:25110}  PLAN:  PT FREQUENCY: {rehab frequency:25116}  PT DURATION:  {rehab duration:25117}  PLANNED INTERVENTIONS: {rehab planned interventions:25118::"Therapeutic exercises","Therapeutic activity","Neuromuscular re-education","Balance training","Gait training","Patient/Family education","Self Care","Joint mobilization"}.  PLAN FOR NEXT SESSION: ***   Zeb Comfort, Student-PT 03/24/2022, 11:44 AM

## 2022-03-27 ENCOUNTER — Ambulatory Visit: Payer: Medicare Other | Attending: Family Medicine | Admitting: Physical Therapy

## 2022-03-27 ENCOUNTER — Encounter: Payer: Self-pay | Admitting: Physical Therapy

## 2022-03-27 ENCOUNTER — Other Ambulatory Visit: Payer: Self-pay

## 2022-03-27 DIAGNOSIS — M6281 Muscle weakness (generalized): Secondary | ICD-10-CM | POA: Insufficient documentation

## 2022-03-27 DIAGNOSIS — R2689 Other abnormalities of gait and mobility: Secondary | ICD-10-CM | POA: Diagnosis not present

## 2022-03-27 DIAGNOSIS — M5459 Other low back pain: Secondary | ICD-10-CM | POA: Insufficient documentation

## 2022-03-27 DIAGNOSIS — M5441 Lumbago with sciatica, right side: Secondary | ICD-10-CM | POA: Diagnosis not present

## 2022-03-30 ENCOUNTER — Ambulatory Visit: Payer: Medicare Other | Attending: Family Medicine

## 2022-03-30 DIAGNOSIS — R2689 Other abnormalities of gait and mobility: Secondary | ICD-10-CM | POA: Diagnosis not present

## 2022-03-30 DIAGNOSIS — M6281 Muscle weakness (generalized): Secondary | ICD-10-CM | POA: Insufficient documentation

## 2022-03-30 DIAGNOSIS — M5459 Other low back pain: Secondary | ICD-10-CM | POA: Diagnosis not present

## 2022-03-30 NOTE — Therapy (Signed)
OUTPATIENT PHYSICAL THERAPY TREATMENT   Patient Name: [name] MRN: [id number] DOB:Dec 13, 1955, 66 y.o., female Today's Date: 03/24/2022   PT End of Session - 03/30/22 1018     Visit Number 2    Date for PT Re-Evaluation 05/22/22    Authorization Type [employer] Medicare    PT Start Time 0933    PT Stop Time 1013    PT Time Calculation (min) 40 min    Activity Tolerance Patient tolerated treatment well    Behavior During Therapy [facility] for tasks assessed/performed              Past Medical History:  Diagnosis Date   Allergic rhinitis    Allergy    Anemia 07/17/2016   Anxiety    Asthma    seasonal   Blood transfusion without reported diagnosis    Breast cancer ([place])    right   Bronchitis    Colon polyps    CPAP (continuous positive airway pressure) dependence    Depression    Diabetes mellitus    borderline but takes metformin   Edema    Family history of breast cancer in first degree relative    Fibromyalgia    GERD (gastroesophageal reflux disease)    Hyperglycemia 03/05/2017   Hyperlipidemia 08/21/2016   no meds   Hyperparathyroidism    Hypertension    controlled by medications   Joint pain    Neuromuscular disorder ([place])    Fibromyalgia   Obesity    Osteoarthritis    RA   PONV (postoperative nausea and vomiting)    occasional   Pre-diabetes    Sleep apnea    wears cpap   Viral meningitis    Vitamin D deficiency     Past Surgical History:  Procedure Laterality Date   ABDOMINAL HYSTERECTOMY  1998   ADENOIDECTOMY     BACK SURGERY     09/21/21   BREAST EXCISIONAL BIOPSY Left 1993   benign   BREAST SURGERY     Tran flap due to breast cancer   CESAREAN SECTION  1988   COLONOSCOPY  08/14/2011   HAND SURGERY     dog bite, right hand   HAND SURGERY     trauma, left hand   KNEE ARTHROSCOPY     bilateral   left knee replacement  2010   LUMBAR WOUND DEBRIDEMENT N/A 10/07/2021   Procedure: LUMBAR WOUND DEBRIDEMENT/REVISION;  Surgeon: Ashok Pall, MD;  Location: [place];  Service: Neurosurgery;  Laterality: N/A;  3C/RM 21 to follow Dr Kathyrn Sheriff   MASTECTOMY  01/13/1994   Right breast   parathyroid resection     PARATHYROIDECTOMY     POLYPECTOMY     rectal abscess     SEPTOPLASTY  1980   SHOULDER ARTHROSCOPY Right 11/09/2015   Procedure: ARTHROSCOPY SHOULDER-acromioplasty, distal clavicle resection and debridement;  Surgeon: Melrose Nakayama, MD;  Location: [place];  Service: Orthopedics;  Laterality: Right;   shoulder arthroscopy     rotator cuff repair   TOE SURGERY Right    paronychia and adenoma removed   TONSILLECTOMY     TOTAL KNEE ARTHROPLASTY  06/18/2008   [place]   TUMOR REMOVAL  1982   , scalp    Patient Active Problem List   Diagnosis Date Noted   Viral bronchitis 03/22/2022   Low back pain 01/11/2022   Drainage from wound 10/07/2021   Spondylolisthesis of lumbar region 09/21/2021   DDD (degenerative disc disease), cervical 05/04/2021   Neck  pain 05/02/2021   Left arm pain 01/21/2021   Memory changes 04/19/2020   Educated about COVID-19 virus infection 04/19/2020   Peripheral neuropathy 04/19/2020   TMJ arthralgia 04/19/2020   Urinary incontinence 10/27/2019   Other peripheral vertigo, unspecified ear 10/27/2019   Vertigo 03/14/2018   RLS (restless legs syndrome) 06/07/2017   Right knee pain 12/06/2016   Arthralgia 12/03/2016   Hyperlipidemia 08/21/2016   Anemia 07/17/2016   Genetic testing 05/15/2016   Allergic urticaria 03/27/2016   Dermographia 03/27/2016   History of breast cancer 03/27/2016   Rheumatoid arthritis ([place]) 06/24/2015   Diverticulitis of colon without hemorrhage 09/21/2014   Asthma with acute exacerbation 07/14/2014   Cough 07/06/2014   Gout of big toe 03/10/2013   Preventative health care 10/08/2012   OSA (obstructive sleep apnea) 04/23/2012   Diabetes mellitus, type 2 ([place]) 12/10/2011   Diarrhea 09/04/2011   Personal history of colonic polyps - adenoma 08/14/2011   Fatigue  08/03/2011   Ear canal dryness 08/03/2011   Palpitations 09/21/2010   Morbid obesity ([place]) 09/21/2010   PAC (premature atrial contraction) 09/21/2010   ATTENTION DEFICIT DISORDER, INATTENTIVE TYPE 09/22/2009   Acute sinusitis 09/22/2009   VIRAL MENINGITIS, HX OF 04/29/2009   Insomnia 02/15/2009   Vitamin D deficiency 03/11/2008   Fibromyalgia 03/05/2008   HYPERPARATHYROIDISM, HX OF 08/06/2007   Allergic rhinitis 07/05/2007   Osteoarthritis 02/28/2007   Edema 11/01/2006   Depression with anxiety 06/25/2006   Essential hypertension 06/25/2006   Gastroesophageal reflux disease without esophagitis 06/25/2006     PCP: Mosie Lukes, MD  REFERRING PROVIDER: Mosie Lukes, MD  REFERRING DIAG: M54.41 (ICD-10-CM) - Right-sided low back pain with right-sided sciatica, unspecified chronicity  Rationale for Evaluation and Treatment: Rehabilitation  THERAPY DIAG:  Other low back pain  Muscle weakness (generalized)  Other abnormalities of gait and mobility   ONSET DATE: 09/21/21 - LUMBAR THREE-FOUR, LUMBAR FOUR-FIVE POSTERIOR LUMBAR INTERBODY FUSION   SUBJECTIVE:                                                                                                                                                                                           SUBJECTIVE STATEMENT: Pt reports she now knows that her legs are weak and she feels that she needs to focus on this the most.  PERTINENT HISTORY:  Diabetes, Osteoarthritis, Anxiety, GERD, R shoulder replacement (10/2015), L TKA, Back surgery (4/23)  PAIN:  Are you having pain? Yes: NPRS scale: 0/10 Pain location: R>L low back, occasional radicular pain into R thigh Pain description: stiffness, takes her breath away at time Aggravating factors: moving in bed, bending forward,  toilet hygiene  Relieving factors: ibuprofen, Tylenol, ice  PRECAUTIONS: None  WEIGHT BEARING RESTRICTIONS: No  FALLS:  Has patient fallen in last 6  months? No  LIVING ENVIRONMENT: Lives with: lives alone Lives in: House/apartment Stairs: No Has following equipment at home: Single point cane and elevated commode with bidet   OCCUPATION: PT - accounting, mostly deskwork  PLOF: Independent, Needs assistance with ADLs (difficulty with toilet hygiene), and Leisure: time with family.  PATIENT GOALS: "To be able to walk and be active again. To be able to clean myself after using the toilet. To be able to bend over an pick things up."   OBJECTIVE:   DIAGNOSTIC FINDINGS:  MRI Lumbar Spine w/o Contrast: 01/07/22  IMPRESSION: 1. Postsurgical changes from L3-L5 PLIF. Bone marrow signal changes within the spinous processes and bilateral lamina of L2, L3, and L4 raise the suspicion for osteomyelitis. 2. Previously described T2 hyperintense material along the right lateral and dorsal epidural space at the L2 and L2-3 levels is not seen on the current study. No IV contrast was administered on the current exam. No new epidural space abnormality is identified. 3. Similar degree of canal stenosis at L2-3.  CT Lumbar Spine w/o Contrast: 01/07/22 IMPRESSION: 1. No CT evidence for acute abnormality within the lumbar spine. 2. Postoperative changes from prior PLIF at L3-4 and L4-5. Mild subsidence at L3-4. No other hardware complication. 3. Underlying degenerative spondylosis and facet arthrosis, better appreciated and described on recent MRIs.  PATIENT SURVEYS:  Modified Oswestry:  19 / 50 = 38.0 %  FOTO: NT  SCREENING FOR RED FLAGS: Bowel or bladder incontinence: Yes: bladder (urge incontinence) Spinal tumors: No Cauda equina syndrome: No Compression fracture: No Abdominal aneurysm: No  COGNITION: Overall cognitive status: Within functional limits for tasks assessed     SENSATION: WFL Intermittent brief R LE radicular pain  MUSCLE LENGTH: Hamstrings: mild tight B Quads: mod tight B  POSTURE: No Significant postural  limitations  PALPATION: Lumbar paraspinals & glutes - muscle tension WNL w/o TTP  LUMBAR ROM:   AROM eval  Flexion <25%  Extension 50%  Right lateral flexion 25%  Left lateral flexion 25%  Right rotation WNL  Left rotation WNL   (Blank rows = not tested)  LOWER EXTREMITY ROM:     Grossly WFL but accessory trunk lean in standing   LOWER EXTREMITY MMT:    MMT Right eval Left eval  Hip flexion 2+ 3+  Hip extension 2+ 2+  Hip abduction 4- 4  Hip adduction 2+ 3+  Hip internal rotation 4+ 4+  Hip external rotation 4+ 4+  Knee flexion 5 5  Knee extension 5 5  Ankle dorsiflexion 5 4  Ankle plantarflexion    Ankle inversion    Ankle eversion     (Blank rows = not tested)  LUMBAR SPECIAL TESTS:  Straight leg raise test: Negative   GAIT: Distance walked: 60 feet, back and forth from exam room Assistive device utilized: Single point cane Level of assistance: Modified independence Comments: increased lumbar extension  TODAY'S TREATMENT:  DATE:  03/30/22 Therapeutic Exercise: Rec Bike [id number] Seated fwd flexion rollout with green pball x 10 Seated marching x 10 bil Seated LAQ x 10 bil Standing lumbar extension at wall 10x 3 sec hold Supine TrA brace with PPT x 10 3 sec hold Supine march with red TB TrA brace x 10  Supine clams with red TB TrA brace x 10  03/27/22 Hooklying HS stretch with strap x 30" Lumbar extension at wall 15 x 3" Standing quad stretch at counter with foot on chair x 30"    PATIENT EDUCATION:  Education details: HEP update Person educated: Patient Education method: Explanation, Demonstration, Verbal cues, and Handouts Education comprehension: verbalized understanding and returned demonstration  HOME EXERCISE PROGRAM: Access Code: [email or web address] URL: [email or web address] Date: 03/30/2022 Prepared by:  BClarene Essex Exercises - Hooklying Hamstring Stretch with Strap  - 2-3 x daily - 7 x weekly - 3 reps - 30 sec hold - Standing Back Extension at Wall  - 2-3 x daily - 7 x weekly - 2 sets - 15 reps - 3 sec hold - QTourist information centre managerwith Chair and Counter Support  - 2-3 x daily - 7 x weekly - 3 reps - 30 sec hold - Supine Posterior Pelvic Tilt  - 1 x daily - 7 x weekly - 3 sets - 10 reps - Hooklying Clamshell with Resistance  - 1 x daily - 7 x weekly - 2 sets - 10 reps - Supine March with Resistance Band  - 1 x daily - 7 x weekly - 3 sets - 10 reps  ASSESSMENT:  CLINICAL IMPRESSION: Pt responded well to treatment. We progressed lumbopelvic strengthening with emphasis on core stabilization to improve support for lumbar spine. She required hand assistance with transfers sitting to supine and vice versa. Close supervision was given with interventions to correct form. Initial reports of cramping with supine marches but this subsided with rest.  OBJECTIVE IMPAIRMENTS:  Abnormal gait, decreased activity tolerance, decreased balance, decreased endurance, decreased mobility, difficulty walking, decreased ROM, decreased strength, hypomobility, impaired perceived functional ability, impaired flexibility, improper body mechanics, and pain.   ACTIVITY LIMITATIONS: carrying, lifting, bending, sitting, standing, squatting, stairs, bed mobility, continence, bathing, toileting, dressing, and locomotion level  PARTICIPATION LIMITATIONS: cleaning, laundry, shopping, community activity, occupation, and attending festivals and ball games with grandchildren  PERSONAL FACTORS: Past/current experiences, Time since onset of injury/illness/exacerbation, and 3+ comorbidities: Diabetes, Osteoarthritis, Anxiety, GERD, R shoulder replacement (10/2015), and L TKA are also affecting patient's functional outcome.   REHAB POTENTIAL: Good  CLINICAL DECISION MAKING: Evolving/moderate complexity  EVALUATION COMPLEXITY:  Moderate   GOALS: Goals reviewed with patient? Yes  SHORT TERM GOALS: Target date: 04/24/2022  Pt will perform HEP independently to demonstrate understanding of activity. Baseline: Goal status: INITIAL  2.  Pt will have a 25% reduction in pain during mobility to improve everyday activity. Baseline:  Goal status: INITIAL  3.  Pt will have a 25% increase in lumbar mobility to perform ADLs such as toileting and bathing. Baseline:  Goal status: INITIAL  4.  Pt will improve strength of proximal hip muscles to >/= 3/5 to increase ability to stand from a chair and walk.  Baseline:  Goal status: INITIAL    LONG TERM GOALS: Target date: 05/22/22  Pt will perform HEP independently to demonstrate understanding of activity. Baseline:  Goal status: INITIAL  2.  Pt will have 75% reduction in pain during mobility to improve everyday activity. Baseline:  Goal status: INITIAL  3. Pt  will have a 75% increase in lumbar mobility to perform ADLs such as toileting and bathing. Baseline:  Goal status: INITIAL  4.  Pt will improve strength of proximal hip muscles to >/=4-/5 to increase ability to stand from a chair and walk.  Baseline:  Goal status: INITIAL  5.  Pt will improve modified Oswestry to 26% to show increase in functional activity. Baseline:  19 / 50 = 38.0 % Goal status: INITIAL  6.  Pt will perform STS with modified independence using single point cane to demonstrate independence and ability to stand from chair.  Baseline:  Goal status: INITIAL   PLAN:  PT FREQUENCY: 2x/week  PT DURATION: 8 weeks  PLANNED INTERVENTIONS: Therapeutic exercises, Therapeutic activity, Neuromuscular re-education, Balance training, Gait training, Patient/Family education, Self Care, Joint mobilization, Joint manipulation, Stair training, Dry Needling, Electrical stimulation, Spinal manipulation, Spinal mobilization, Cryotherapy, Moist heat, Manual therapy, and Re-evaluation.  PLAN FOR NEXT  SESSION: review HEP, lumbopelvic mobility, proximal hip muscle strengthening   Zeb Comfort, Student-PT 03/24/2022, 11:44 AM

## 2022-04-03 ENCOUNTER — Ambulatory Visit: Payer: Medicare Other | Admitting: Physical Therapy

## 2022-04-03 ENCOUNTER — Ambulatory Visit (INDEPENDENT_AMBULATORY_CARE_PROVIDER_SITE_OTHER): Payer: Medicare Other | Admitting: Family Medicine

## 2022-04-03 ENCOUNTER — Encounter: Payer: Self-pay | Admitting: Physical Therapy

## 2022-04-03 VITALS — BP 136/82 | HR 107 | Temp 98.0°F | Resp 16 | Ht 66.0 in | Wt 285.0 lb

## 2022-04-03 DIAGNOSIS — E559 Vitamin D deficiency, unspecified: Secondary | ICD-10-CM

## 2022-04-03 DIAGNOSIS — J45901 Unspecified asthma with (acute) exacerbation: Secondary | ICD-10-CM | POA: Diagnosis not present

## 2022-04-03 DIAGNOSIS — R252 Cramp and spasm: Secondary | ICD-10-CM | POA: Diagnosis not present

## 2022-04-03 DIAGNOSIS — R700 Elevated erythrocyte sedimentation rate: Secondary | ICD-10-CM

## 2022-04-03 DIAGNOSIS — E782 Mixed hyperlipidemia: Secondary | ICD-10-CM

## 2022-04-03 DIAGNOSIS — M6281 Muscle weakness (generalized): Secondary | ICD-10-CM

## 2022-04-03 DIAGNOSIS — E1140 Type 2 diabetes mellitus with diabetic neuropathy, unspecified: Secondary | ICD-10-CM | POA: Diagnosis not present

## 2022-04-03 DIAGNOSIS — R051 Acute cough: Secondary | ICD-10-CM | POA: Diagnosis not present

## 2022-04-03 DIAGNOSIS — M5441 Lumbago with sciatica, right side: Secondary | ICD-10-CM

## 2022-04-03 DIAGNOSIS — M5459 Other low back pain: Secondary | ICD-10-CM

## 2022-04-03 DIAGNOSIS — R2689 Other abnormalities of gait and mobility: Secondary | ICD-10-CM | POA: Diagnosis not present

## 2022-04-03 DIAGNOSIS — I10 Essential (primary) hypertension: Secondary | ICD-10-CM

## 2022-04-03 HISTORY — DX: Elevated erythrocyte sedimentation rate: R70.0

## 2022-04-03 LAB — COMPREHENSIVE METABOLIC PANEL
ALT: 20 U/L (ref 0–35)
AST: 16 U/L (ref 0–37)
Albumin: 4.1 g/dL (ref 3.5–5.2)
Alkaline Phosphatase: 72 U/L (ref 39–117)
BUN: 14 mg/dL (ref 6–23)
CO2: 30 mEq/L (ref 19–32)
Calcium: 9.9 mg/dL (ref 8.4–10.5)
Chloride: 97 mEq/L (ref 96–112)
Creatinine, Ser: 0.88 mg/dL (ref 0.40–1.20)
GFR: 67.87 mL/min (ref >60.00)
Glucose, Bld: 113 mg/dL — ABNORMAL HIGH (ref 70–99)
Potassium: 3.8 mEq/L (ref 3.5–5.1)
Sodium: 138 mEq/L (ref 135–145)
Total Bilirubin: 0.4 mg/dL (ref 0.2–1.2)
Total Protein: 7.2 g/dL (ref 6.0–8.3)

## 2022-04-03 LAB — VITAMIN D 25 HYDROXY (VIT D DEFICIENCY, FRACTURES): VITD: 59.29 ng/mL (ref 30.00–100.00)

## 2022-04-03 LAB — LIPID PANEL
Cholesterol: 183 mg/dL (ref 0–200)
HDL: 61.2 mg/dL (ref >39.00)
LDL Cholesterol: 88 mg/dL (ref 0–99)
NonHDL: 121.85
Total CHOL/HDL Ratio: 3
Triglycerides: 168 mg/dL — ABNORMAL HIGH (ref 0.0–149.0)
VLDL: 33.6 mg/dL (ref 0.0–40.0)

## 2022-04-03 LAB — CBC
HCT: 36.6 % (ref 36.0–46.0)
Hemoglobin: 11.6 g/dL — ABNORMAL LOW (ref 12.0–15.0)
MCHC: 31.7 g/dL (ref 30.0–36.0)
MCV: 83.6 fl (ref 78.0–100.0)
Platelets: 335 10*3/uL (ref 150.0–400.0)
RBC: 4.37 Mil/uL (ref 3.87–5.11)
RDW: 19.7 % — ABNORMAL HIGH (ref 11.5–15.5)
WBC: 10.5 10*3/uL (ref 4.0–10.5)

## 2022-04-03 LAB — MAGNESIUM: Magnesium: 1.9 mg/dL (ref 1.5–2.5)

## 2022-04-03 LAB — SEDIMENTATION RATE: Sed Rate: 42 mm/hr — ABNORMAL HIGH (ref 0–30)

## 2022-04-03 LAB — HEMOGLOBIN A1C: Hgb A1c MFr Bld: 6.3 % (ref 4.6–6.5)

## 2022-04-03 LAB — HIGH SENSITIVITY CRP: CRP, High Sensitivity: 6.8 mg/L — ABNORMAL HIGH (ref 0.000–5.000)

## 2022-04-03 LAB — TSH: TSH: 0.56 u[IU]/mL (ref 0.35–5.50)

## 2022-04-03 MED ORDER — HYDROCODONE BIT-HOMATROP MBR 5-1.5 MG/5ML PO SOLN: 5.0000 mL | Freq: Four times a day (QID) | ORAL | 0 refills | Status: DC | PRN

## 2022-04-03 MED ORDER — METHYLPREDNISOLONE 4 MG PO TABS: ORAL_TABLET | ORAL | 0 refills | Status: DC

## 2022-04-03 NOTE — Assessment & Plan Note (Signed)
Supplement and monitor 

## 2022-04-03 NOTE — Assessment & Plan Note (Signed)
Encourage heart healthy diet such as MIND or DASH diet, increase exercise, avoid trans fats, simple carbohydrates and processed foods, consider a krill or fish or flaxseed oil cap daily.  Patient not takig statin 

## 2022-04-03 NOTE — Assessment & Plan Note (Signed)
Encouraged DASH or MIND diet, decrease po intake and increase exercise as tolerated. Needs 7-8 hours of sleep nightly. Avoid trans fats, eat small, frequent meals every 4-5 hours with lean proteins, complex carbs and healthy fats. Minimize simple carbs, high fat foods and processed foods 

## 2022-04-03 NOTE — Assessment & Plan Note (Addendum)
Encouraged moist heat and gentle stretching as tolerated. May try NSAIDs and prescription meds as directed and report if symptoms worsen or seek immediate care her pain has improved significantly with PT and she is working with ortho

## 2022-04-03 NOTE — Therapy (Signed)
OUTPATIENT PHYSICAL THERAPY TREATMENT   Patient Name: [name] MRN: [id number] DOB:[birth year], 66 y.o., female Today's Date: 03/24/2022   PT End of Session - 04/03/22 0928     Visit Number 3    Date for PT Re-Evaluation 05/22/22    Authorization Type [employer] Medicare    PT Start Time 0928    PT Stop Time 1013    PT Time Calculation (min) 45 min    Activity Tolerance Patient tolerated treatment well    Behavior During Therapy [facility] for tasks assessed/performed               Past Medical History:  Diagnosis Date   Allergic rhinitis    Allergy    Anemia 07/17/2016   Anxiety    Asthma    seasonal   Blood transfusion without reported diagnosis    Breast cancer ([place])    right   Bronchitis    Colon polyps    CPAP (continuous positive airway pressure) dependence    Depression    Diabetes mellitus    borderline but takes metformin   Edema    Family history of breast cancer in first degree relative    Fibromyalgia    GERD (gastroesophageal reflux disease)    Hyperglycemia 03/05/2017   Hyperlipidemia 08/21/2016   no meds   Hyperparathyroidism    Hypertension    controlled by medications   Joint pain    Neuromuscular disorder ([place])    Fibromyalgia   Obesity    Osteoarthritis    RA   PONV (postoperative nausea and vomiting)    occasional   Pre-diabetes    Sleep apnea    wears cpap   Viral meningitis    Vitamin D deficiency     Past Surgical History:  Procedure Laterality Date   ABDOMINAL HYSTERECTOMY  1998   ADENOIDECTOMY     BACK SURGERY     09/21/21   BREAST EXCISIONAL BIOPSY Left 1993   benign   BREAST SURGERY     Tran flap due to breast cancer   CESAREAN SECTION  1988   COLONOSCOPY  08/14/2011   HAND SURGERY     dog bite, right hand   HAND SURGERY     trauma, left hand   KNEE ARTHROSCOPY     bilateral   left knee replacement  2010   LUMBAR WOUND DEBRIDEMENT N/A 10/07/2021   Procedure: LUMBAR WOUND DEBRIDEMENT/REVISION;  Surgeon:  Ashok Pall, MD;  Location: [place];  Service: Neurosurgery;  Laterality: N/A;  3C/RM 21 to follow Dr Kathyrn Sheriff   MASTECTOMY  01/13/1994   Right breast   parathyroid resection     PARATHYROIDECTOMY     POLYPECTOMY     rectal abscess     SEPTOPLASTY  1980   SHOULDER ARTHROSCOPY Right 11/09/2015   Procedure: ARTHROSCOPY SHOULDER-acromioplasty, distal clavicle resection and debridement;  Surgeon: Melrose Nakayama, MD;  Location: [place];  Service: Orthopedics;  Laterality: Right;   shoulder arthroscopy     rotator cuff repair   TOE SURGERY Right    paronychia and adenoma removed   TONSILLECTOMY     TOTAL KNEE ARTHROPLASTY  06/18/2008   [place]   TUMOR REMOVAL  1982   , scalp    Patient Active Problem List   Diagnosis Date Noted   Elevated sed rate 04/03/2022   Viral bronchitis 03/22/2022   Low back pain 01/11/2022   Drainage from wound 10/07/2021   Spondylolisthesis of lumbar region 09/21/2021   DDD (degenerative  disc disease), cervical 05/04/2021   Neck pain 05/02/2021   Left arm pain 01/21/2021   Memory changes 04/19/2020   Educated about COVID-19 virus infection 04/19/2020   Peripheral neuropathy 04/19/2020   TMJ arthralgia 04/19/2020   Urinary incontinence 10/27/2019   Other peripheral vertigo, unspecified ear 10/27/2019   Vertigo 03/14/2018   RLS (restless legs syndrome) 06/07/2017   Right knee pain 12/06/2016   Arthralgia 12/03/2016   Hyperlipidemia 08/21/2016   Anemia 07/17/2016   Genetic testing 05/15/2016   Allergic urticaria 03/27/2016   Dermographia 03/27/2016   History of breast cancer 03/27/2016   Rheumatoid arthritis ([place]) 06/24/2015   Diverticulitis of colon without hemorrhage 09/21/2014   Asthma with acute exacerbation 07/14/2014   Cough 07/06/2014   Gout of big toe 03/10/2013   Preventative health care 10/08/2012   OSA (obstructive sleep apnea) 04/23/2012   Diabetes mellitus, type 2 ([place]) 12/10/2011   Diarrhea 09/04/2011   Personal history of colonic  polyps - adenoma 08/14/2011   Fatigue 08/03/2011   Ear canal dryness 08/03/2011   Palpitations 09/21/2010   Morbid obesity ([place]) 09/21/2010   PAC (premature atrial contraction) 09/21/2010   ATTENTION DEFICIT DISORDER, INATTENTIVE TYPE 09/22/2009   Acute sinusitis 09/22/2009   VIRAL MENINGITIS, HX OF 04/29/2009   Insomnia 02/15/2009   Vitamin D deficiency 03/11/2008   Fibromyalgia 03/05/2008   HYPERPARATHYROIDISM, HX OF 08/06/2007   Allergic rhinitis 07/05/2007   Osteoarthritis 02/28/2007   Edema 11/01/2006   Depression with anxiety 06/25/2006   Essential hypertension 06/25/2006   Gastroesophageal reflux disease without esophagitis 06/25/2006     PCP: Mosie Lukes, MD  REFERRING PROVIDER: Mosie Lukes, MD  REFERRING DIAG: M54.41 (ICD-10-CM) - Right-sided low back pain with right-sided sciatica, unspecified chronicity  Rationale for Evaluation and Treatment: Rehabilitation  THERAPY DIAG:  Other low back pain  Muscle weakness (generalized)  Other abnormalities of gait and mobility   ONSET DATE: 09/21/21 - LUMBAR THREE-FOUR, LUMBAR FOUR-FIVE POSTERIOR LUMBAR INTERBODY FUSION   SUBJECTIVE:                                                                                                                                                                                           SUBJECTIVE STATEMENT: Pt reports that she is improving and feels better. She was able to stand from chair in lobby without assist or a lot of struggle. HEP is going well and reports that she is able to remind herself to breathe during her exercises.  PERTINENT HISTORY:  Diabetes, Osteoarthritis, Anxiety, GERD, R shoulder replacement (10/2015), L TKA, Back surgery (4/23)  PAIN:  Are you having pain? No: NPRS scale:  0/10 Pain location: R>L low back, with certain movements but no pain currently Pain description: stiffness Aggravating factors: moving in bed, bending forward, toilet hygiene   Relieving factors: ibuprofen, Tylenol, ice  PRECAUTIONS: None  WEIGHT BEARING RESTRICTIONS: No  FALLS:  Has patient fallen in last 6 months? No  LIVING ENVIRONMENT: Lives with: lives alone Lives in: House/apartment Stairs: No Has following equipment at home: Single point cane and elevated commode with bidet   OCCUPATION: PT - accounting, mostly deskwork  PLOF: Independent, Needs assistance with ADLs (difficulty with toilet hygiene), and Leisure: time with family.  PATIENT GOALS: "To be able to walk and be active again. To be able to clean myself after using the toilet. To be able to bend over an pick things up."   OBJECTIVE:   DIAGNOSTIC FINDINGS:  MRI Lumbar Spine w/o Contrast: 01/07/22  IMPRESSION: 1. Postsurgical changes from L3-L5 PLIF. Bone marrow signal changes within the spinous processes and bilateral lamina of L2, L3, and L4 raise the suspicion for osteomyelitis. 2. Previously described T2 hyperintense material along the right lateral and dorsal epidural space at the L2 and L2-3 levels is not seen on the current study. No IV contrast was administered on the current exam. No new epidural space abnormality is identified. 3. Similar degree of canal stenosis at L2-3.  CT Lumbar Spine w/o Contrast: 01/07/22 IMPRESSION: 1. No CT evidence for acute abnormality within the lumbar spine. 2. Postoperative changes from prior PLIF at L3-4 and L4-5. Mild subsidence at L3-4. No other hardware complication. 3. Underlying degenerative spondylosis and facet arthrosis, better appreciated and described on recent MRIs.  PATIENT SURVEYS:  Modified Oswestry:  19 / 50 = 38.0 %  FOTO: NT  SCREENING FOR RED FLAGS: Bowel or bladder incontinence: Yes: bladder (urge incontinence) Spinal tumors: No Cauda equina syndrome: No Compression fracture: No Abdominal aneurysm: No  COGNITION: Overall cognitive status: Within functional limits for tasks  assessed     SENSATION: WFL Intermittent brief R LE radicular pain  MUSCLE LENGTH: Hamstrings: mild tight B Quads: mod tight B  POSTURE: No Significant postural limitations  PALPATION: Lumbar paraspinals & glutes - muscle tension WNL w/o TTP  LUMBAR ROM:   AROM eval  Flexion <25%  Extension 50%  Right lateral flexion 25%  Left lateral flexion 25%  Right rotation WNL  Left rotation WNL   (Blank rows = not tested)  LOWER EXTREMITY ROM:     Grossly WFL but accessory trunk lean in standing   LOWER EXTREMITY MMT:    MMT Right eval Left eval  Hip flexion 2+ 3+  Hip extension 2+ 2+  Hip abduction 4- 4  Hip adduction 2+ 3+  Hip internal rotation 4+ 4+  Hip external rotation 4+ 4+  Knee flexion 5 5  Knee extension 5 5  Ankle dorsiflexion 5 4  Ankle plantarflexion    Ankle inversion    Ankle eversion     (Blank rows = not tested)  LUMBAR SPECIAL TESTS:  Straight leg raise test: Negative   GAIT: Distance walked: 60 feet, back and forth from exam room Assistive device utilized: Single point cane Level of assistance: Modified independence Comments: increased lumbar extension  TODAY'S TREATMENT:  DATE:   04/03/22 THERAPEUTIC EXERCISE: to improve flexibility, strength and mobility.  Verbal and tactile cues throughout for technique.  Rec Bike L2 x 6 min Seated fwd flexion rollout with green pball 10x -cues for 3 sec hold in fwd flexed position Seated iso hip add ball squeeze 2x10 with 5 sec hold Seated iso hip add ball squeeze with LAQ 1x10 Supine TrA brace + alt march with RTB 10x with 3 sec hold- cues to for TrA contraction Supine TrA brace +alt march with BLE hold in between with RTB 10x- cues for sequencing for LE and breathing during activity Supine SLR R & L x10- cues for TrA contraction Standing ext at wall 3x15 STS with table  elevated to 45 hip flexion  2x10- cues to use hands on table instead of anterior thighs  03/30/22 Therapeutic Exercise: Rec Bike [id number] Seated fwd flexion rollout with green pball x 10 Seated marching x 10 bil Seated LAQ x 10 bil Standing lumbar extension at wall 10x 3 sec hold Supine TrA brace with PPT x 10 3 sec hold Supine march with red TB TrA brace x 10  Supine clams with red TB TrA brace x 10   03/27/22 Hooklying HS stretch with strap x 30" Lumbar extension at wall 15 x 3" Standing quad stretch at counter with foot on chair x 30"    PATIENT EDUCATION:  Education details: HEP progression - hip isometric add, supine SLR, supine TrA with alt marches Person educated: Patient Education method: EConsulting civil engineer DMedia planner Verbal cues, and Handouts Education comprehension: verbalized understanding and returned demonstration  HOME EXERCISE PROGRAM: Access Code: [email or web address] URL: [email or web address] Date: 04/03/2022 Prepared by: OZeb Comfort Exercises - Hooklying Hamstring Stretch with Strap  - 2-3 x daily - 7 x weekly - 3 reps - 30 sec hold - Standing Back Extension [place] - 2-3 x daily - 7 x weekly - 2 sets - 15 reps - 3 sec hold - QTourist information centre managerwith Chair and Counter Support  - 2-3 x daily - 7 x weekly - 3 reps - 30 sec hold - Supine Posterior Pelvic Tilt  - 1 x daily - 7 x weekly - 3 sets - 10 reps - Hooklying Clamshell with Resistance  - 1 x daily - 7 x weekly - 2 sets - 10 reps - Supine March with Resistance Band  - 1 x daily - 7 x weekly - 3 sets - 10 reps - Seated Hip Adduction Isometrics with Ball  - 1 x daily - 7 x weekly - 2 sets - 10 reps - 5 hold - Supine March with Alternating Leg Lifts  - 1 x daily - 7 x weekly - 3 sets - 10 reps - Supine Pelvic Tilt with Straight Leg Raise  - 1 x daily - 7 x weekly - 2 sets - 10 reps  ASSESSMENT:  CLINICAL IMPRESSION: Jemma reports increased mobility and activity after previous treatment. She was able to  tolerate treatment well with progression of lumbopelvic strengthening for core stabilization. She was able to perform transfers from sitting to standing without external assistance and responded well to LE strengthening intervention. Continued skilled therapy for core stabilization and LE strengthening to address impairments.   OBJECTIVE IMPAIRMENTS:  Abnormal gait, decreased activity tolerance, decreased balance, decreased endurance, decreased mobility, difficulty walking, decreased ROM, decreased strength, hypomobility, impaired perceived functional ability, impaired flexibility, improper body mechanics, and pain.   ACTIVITY LIMITATIONS: carrying, lifting, bending, sitting, standing, squatting, stairs, bed mobility, continence,  bathing, toileting, dressing, and locomotion level  PARTICIPATION LIMITATIONS: cleaning, laundry, shopping, community activity, occupation, and attending festivals and ball games with grandchildren  PERSONAL FACTORS: Past/current experiences, Time since onset of injury/illness/exacerbation, and 3+ comorbidities: Diabetes, Osteoarthritis, Anxiety, GERD, R shoulder replacement (10/2015), and L TKA are also affecting patient's functional outcome.   REHAB POTENTIAL: Good  CLINICAL DECISION MAKING: Evolving/moderate complexity  EVALUATION COMPLEXITY: Moderate   GOALS: Goals reviewed with patient? Yes  SHORT TERM GOALS: Target date: 04/24/2022  Pt will perform HEP independently to demonstrate understanding of activity. Baseline: Goal status: IN PROGRESS  2.  Pt will have a 25% reduction in pain during mobility to improve everyday activity. Baseline:  Goal status: IN PROGRESS  3.  Pt will have a 25% increase in lumbar mobility to perform ADLs such as toileting and bathing. Baseline:  Goal status: IN PROGRESS  4.  Pt will improve strength of proximal hip muscles to >/= 3/5 to increase ability to stand from a chair and walk.  Baseline:  Goal status: IN  PROGRESS    LONG TERM GOALS: Target date: 05/22/22  Pt will perform HEP independently to demonstrate understanding of activity. Baseline:  Goal status: IN PROGRESS  2.  Pt will have 75% reduction in pain during mobility to improve everyday activity. Baseline:  Goal status: IN PROGRESS  3. Pt will have a 75% increase in lumbar mobility to perform ADLs such as toileting and bathing. Baseline:  Goal status: IN PROGRESS  4.  Pt will improve strength of proximal hip muscles to >/=4-/5 to increase ability to stand from a chair and walk.  Baseline:  Goal status: IN PROGRESS  5.  Pt will improve modified Oswestry to 26% to show increase in functional activity. Baseline:  19 / 50 = 38.0 % Goal status: IN PROGRESS  6.  Pt will perform STS with modified independence using single point cane to demonstrate independence and ability to stand from chair.  Baseline:  Goal status: IN PROGRESS   PLAN:  PT FREQUENCY: 2x/week  PT DURATION: 8 weeks  PLANNED INTERVENTIONS: Therapeutic exercises, Therapeutic activity, Neuromuscular re-education, Balance training, Gait training, Patient/Family education, Self Care, Joint mobilization, Joint manipulation, Stair training, Dry Needling, Electrical stimulation, Spinal manipulation, Spinal mobilization, Cryotherapy, Moist heat, Manual therapy, and Re-evaluation.  PLAN FOR NEXT SESSION: review HEP, progress lumbopelvic mobility and core strengthening, progress proximal hip muscle strengthening   Zeb Comfort, Student-PT 03/24/2022, 11:44 AM

## 2022-04-03 NOTE — Progress Notes (Signed)
Subjective:   By signing my name below, I, Penni Homans MD, attest that this documentation has been prepared under the direction and in the presence of Penni Homans MD, 04/03/2022.   Patient ID: [name], female    DOB: Oct 26, 1955, 66 y.o.   MRN: [id number]  Chief Complaint  Patient presents with   Follow-up    Follow up    HPI Patient is in today for an office visit.  Cough  Patient is complaining of a productive cough with clear and bubbly sputum starting two weeks ago. She was in office 03/22/2022 for this issue and was given a 108 mcg/act Albuterol inhaler and 10 mg prednisone. Patient reports that the prednisone affected her mood and caused her to become mean. She stopped medication for this reason. She denies sore throat and fever. She confirms post nasal drip, congestion, and itchy ears.  Back pain Patient reports that her back pain is improving after three physical therapy sessions. She has a hx of low back pain with right sided sciatica and had back surgery in April 2023.  Nerve pain Patient reports that she no longer uses her 50 mg Lyrica because it caused swollen legs.  Restless leg syndrome  Patient reports that in the morning, her feet move by themselves and experiences cramps. She states that if she stretches, this does not occur.  Health Maintenance Due  Topic Date Due   Diabetic kidney evaluation - Urine ACR  Never done   Pneumonia Vaccine 59+ Years old (2 - PCV) 04/19/2021   COLONOSCOPY (Pts 45-31yr Insurance coverage will need to be confirmed)  09/06/2021   FOOT EXAM  10/28/2021    Past Medical History:  Diagnosis Date   Allergic rhinitis    Allergy    Anemia 07/17/2016   Anxiety    Asthma    seasonal   Blood transfusion without reported diagnosis    Breast cancer ([place]    right   Bronchitis    Colon polyps    CPAP (continuous positive airway pressure) dependence    Depression    Diabetes mellitus    borderline but takes metformin    Edema    Family history of breast cancer in first degree relative    Fibromyalgia    GERD (gastroesophageal reflux disease)    Hyperglycemia 03/05/2017   Hyperlipidemia 08/21/2016   no meds   Hyperparathyroidism    Hypertension    controlled by medications   Joint pain    Neuromuscular disorder ([facility]    Fibromyalgia   Obesity    Osteoarthritis    RA   PONV (postoperative nausea and vomiting)    occasional   Pre-diabetes    Sleep apnea    wears cpap   Viral meningitis    Vitamin D deficiency     Past Surgical History:  Procedure Laterality Date   ABDOMINAL HYSTERECTOMY  1998   ADENOIDECTOMY     BACK SURGERY     09/21/21   BREAST EXCISIONAL BIOPSY Left 1993   benign   BREAST SURGERY     Tran flap due to breast cancer   CESAREAN SECTION  1988   COLONOSCOPY  08/14/2011   HAND SURGERY     dog bite, right hand   HAND SURGERY     trauma, left hand   KNEE ARTHROSCOPY     bilateral   left knee replacement  2010   LUMBAR WOUND DEBRIDEMENT N/A 10/07/2021   Procedure: LUMBAR WOUND DEBRIDEMENT/REVISION;  Surgeon:  Ashok Pall, MD;  Location: [facility];  Service: Neurosurgery;  Laterality: N/A;  3C/RM 21 to follow Dr Kathyrn Sheriff   MASTECTOMY  01/13/1994   Right breast   parathyroid resection     PARATHYROIDECTOMY     POLYPECTOMY     rectal abscess     SEPTOPLASTY  1980   SHOULDER ARTHROSCOPY Right 11/09/2015   Procedure: ARTHROSCOPY SHOULDER-acromioplasty, distal clavicle resection and debridement;  Surgeon: Melrose Nakayama, MD;  Location: [facility];  Service: Orthopedics;  Laterality: Right;   shoulder arthroscopy     rotator cuff repair   TOE SURGERY Right    paronychia and adenoma removed   TONSILLECTOMY     TOTAL KNEE ARTHROPLASTY  06/18/2008   [facility]   TUMOR REMOVAL  1982   , scalp    Family History  Problem Relation Age of Onset   Emphysema Father    Lung cancer Father        lung ca dx 74   Other Father        prostate issues   Diabetes Father    Breast cancer  Maternal Aunt        dx late 73s   Colon cancer Maternal Aunt        dx 71s   Colon polyps Maternal Aunt    Prostate cancer Maternal Grandfather 77       d. 85y   Heart disease Paternal Grandfather    Heart attack Paternal Grandfather        d. 50y   Diabetes Paternal Grandfather    Asthma Daughter        "seasonal"   Arthritis Maternal Grandmother    Aneurysm Maternal Grandmother        d. brain aneurysm at 22   Arthritis Mother    Colon polyps Mother    Hypertension Mother    Sleep apnea Mother    Multiple sclerosis Sister    Breast cancer Sister 34       L IDC and DCIS; ER/PR+, Her2-   Fibrocystic breast disease Sister    Arthritis/Rheumatoid Sister    Breast cancer Sister    Cancer Maternal Uncle        d. mouth cancer at younger age; smoker   Other Paternal Uncle        muscle issues - couldn't walk or talk; d. 20y   Cancer Maternal Aunt        lymphoma, dx 22s   Ovarian cancer Maternal Aunt        dx 32s; d. late 81s   Lung cancer Maternal Uncle        d. 38y; former smoker   Throat cancer Cousin        maternal 1st cousin; smoker   Breast cancer Cousin 38       maternal 1st cousin   Other Paternal Uncle        prostate issues   Breast cancer Cousin        paternal 1st cousin dx late 80s   Throat cancer Cousin        maternal 1st cousin; used SL tobacco   Prostate cancer Cousin        maternal 1st cousin dx 19s   Esophageal cancer Neg Hx    Rectal cancer Neg Hx    Stomach cancer Neg Hx     Social History   Socioeconomic History   Marital status: Widowed    Spouse name: Not on file   Number  of children: 1   Years of education: Not on file   Highest education level: 12th grade  Occupational History   Occupation: ACCOUNTING    Employer: [employer]  Tobacco Use   Smoking status: Never   Smokeless tobacco: Never  Vaping Use   Vaping Use: Never used  Substance and Sexual Activity   Alcohol use: Not Currently   Drug use: No   Sexual  activity: Not Currently  Other Topics Concern   Not on file  Social History Narrative   Not on file   Social Determinants of Health   Financial Resource Strain: Low Risk  (02/21/2022)   Overall Financial Resource Strain (CARDIA)    Difficulty of Paying Living Expenses: Not hard at all  Food Insecurity: No Food Insecurity (02/21/2022)   Hunger Vital Sign    Worried About Running Out of Food in the Last Year: Never true    [place] in the Last Year: Never true  Transportation Needs: No Transportation Needs (02/21/2022)   PRAPARE - Hydrologist (Medical): No    Lack of Transportation (Non-Medical): No  Physical Activity: Inactive (02/21/2022)   Exercise Vital Sign    Days of Exercise per Week: 0 days    Minutes of Exercise per Session: 0 min  Stress: No Stress Concern Present (02/21/2022)   [place]    Feeling of Stress : Not at all  Social Connections: Moderately Isolated (02/21/2022)   Social Connection and Isolation Panel [NHANES]    Frequency of Communication with Friends and Family: More than three times a week    Frequency of Social Gatherings with Friends and Family: Once a week    Attends Religious Services: 1 to 4 times per year    Active Member of [employer] or Organizations: No    Attends Archivist Meetings: Never    Marital Status: Widowed  Intimate Partner Violence: Not At Risk (02/21/2022)   Humiliation, Afraid, Rape, and Kick questionnaire    Fear of Current or Ex-Partner: No    Emotionally Abused: No    Physically Abused: No    Sexually Abused: No    Outpatient Medications Prior to Visit  Medication Sig Dispense Refill   acetaminophen (TYLENOL) 650 MG CR tablet Take 1,300 mg by mouth every 8 (eight) hours as needed for pain.     albuterol (VENTOLIN HFA) 108 (90 Base) MCG/ACT inhaler Inhale 2 puffs into the lungs every 6 (six) hours as needed for wheezing or  shortness of breath. 8 g 0   calcium carbonate (OSCAL) 1500 (600 Ca) MG TABS tablet Take 1,500 mg by mouth 2 (two) times daily with a meal.     cholecalciferol (VITAMIN D3) 25 MCG (1000 UNIT) tablet Take 1,000 Units by mouth daily.     Cyanocobalamin (B-12) 1000 MCG CAPS Take 1 capsule by mouth daily at 6 (six) AM.     famotidine (PEPCID) 40 MG tablet Take 1 tablet (40 mg total) by mouth daily. 30 tablet 5   furosemide (LASIX) 40 MG tablet TAKE 1 TABLET BY MOUTH  TWICE DAILY (Patient taking differently: Take 40 mg by mouth daily.) 180 tablet 3   hydrochlorothiazide (MICROZIDE) 12.5 MG capsule TAKE 1 CAPSULE BY MOUTH DAILY (Patient taking differently: Take 12.5 mg by mouth daily.) 90 capsule 3   Iron, Ferrous Sulfate, 325 (65 Fe) MG TABS Take 325 mg by mouth every other day. (Patient taking  differently: Take 325 mg by mouth daily.) 30 tablet    metFORMIN (GLUCOPHAGE) 500 MG tablet TAKE 1 TABLET BY MOUTH  TWICE DAILY WITH A MEAL (Patient taking differently: Take 500 mg by mouth 2 (two) times daily with a meal.) 180 tablet 3   Multiple Vitamins-Minerals (MULTIVITAMIN & MINERAL PO) Take 1 tablet by mouth daily.     telmisartan (MICARDIS) 20 MG tablet TAKE 1 TABLET BY MOUTH DAILY (Patient taking differently: Take 20 mg by mouth daily.) 90 tablet 3   predniSONE (DELTASONE) 10 MG tablet 4 tabs by mouth once daily for 2 days, then 3 tabs daily x 2 days, then 2 tabs daily x 2 days, then 1 tab daily x 2 days (Patient not taking: Reported on 03/27/2022) 20 tablet 0   pregabalin (LYRICA) 50 MG capsule take 1-2 capsules by mouth twice a day as instructed. (Patient not taking: Reported on 03/27/2022)     No facility-administered medications prior to visit.    Allergies  Allergen Reactions   Allopurinol Hives   Mucinex [Guaifenesin Er] Shortness Of Breath   Penicillins Hives and Other (See Comments)    Has patient had a PCN reaction causing immediate rash, facial/tongue/throat swelling, SOB or lightheadedness  with hypotension: no Has patient had a PCN reaction causing severe rash involving mucus membranes or skin necrosis: no Has patient had a PCN reaction that required hospitalization no Has patient had a PCN reaction occurring within the last 10 years: no If all of the above answers are "NO", then may proceed with Cephalosporin use.    Prozac [Fluoxetine Hcl] Hives   Amitriptyline Other (See Comments)    "felt weird, fatigue, dizziness"   Lexapro [Escitalopram Oxalate]     Nausea and hypersalivation.     Review of Systems  Constitutional:  Negative for fever.  HENT:  Positive for congestion and ear pain (itchy). Negative for sore throat.   Respiratory:  Positive for cough (clear and bubbly sputum).   Musculoskeletal:        (+) Restless leg syndrome         Objective:    Physical Exam Constitutional:      General: She is not in acute distress.    Appearance: Normal appearance. She is not ill-appearing.  HENT:     Head: Normocephalic and atraumatic.     Right Ear: External ear normal.     Left Ear: External ear normal.  Eyes:     Extraocular Movements: Extraocular movements intact.     Pupils: Pupils are equal, round, and reactive to light.  Cardiovascular:     Rate and Rhythm: Normal rate and regular rhythm.     Heart sounds: Normal heart sounds. No murmur heard.    No gallop.  Pulmonary:     Effort: Pulmonary effort is normal. No respiratory distress.     Breath sounds: Examination of the right-lower field reveals wheezing. Wheezing present. No rales.  Skin:    General: Skin is warm and dry.  Neurological:     Mental Status: She is alert and oriented to person, place, and time.  Psychiatric:        Judgment: Judgment normal.     BP 136/82 (BP Location: Right Arm, Patient Position: Sitting, Cuff Size: Normal)   Pulse (!) 107   Temp 98 F (36.7 C) (Oral)   Resp 16   Ht _0  (1.676 m)   Wt 285 lb (129.3 kg)   SpO2 97%   BMI 46.00 kg/m  Wt Readings from Last 3  Encounters:  04/03/22 285 lb (129.3 kg)  03/22/22 290 lb (131.5 kg)  01/10/22 284 lb 9.6 oz (129.1 kg)       Assessment & Plan:   Problem List Items Addressed This Visit       Cardiovascular and Mediastinum   Essential hypertension    Well controlled, no changes to meds. Encouraged heart healthy diet such as the DASH diet and exercise as tolerated.          Endocrine   Diabetes mellitus, type 2 (HCC)    hgba1c acceptable, minimize simple carbs. Increase exercise as tolerated. Continue current meds         Other   Vitamin D deficiency    Supplement and monitor       Morbid obesity ([place])    Encouraged DASH or MIND diet, decrease po intake and increase exercise as tolerated. Needs 7-8 hours of sleep nightly. Avoid trans fats, eat small, frequent meals every 4-5 hours with lean proteins, complex carbs and healthy fats. Minimize simple carbs, high fat foods and processed foods       Hyperlipidemia - Primary    Encourage heart healthy diet such as MIND or DASH diet, increase exercise, avoid trans fats, simple carbohydrates and processed foods, consider a krill or fish or flaxseed oil cap daily.  Patient not takig statin      Low back pain    Encouraged moist heat and gentle stretching as tolerated. May try NSAIDs and prescription meds as directed and report if symptoms worsen or seek immediate care       Elevated sed rate    Elevated CRP as well since her back surgery, repeat labs today       No orders of the defined types were placed in this encounter.   Cranston Neighbor MD, personally preformed the services described in this documentation.  All medical record entries made by the scribe were at my direction and in my presence.  I have reviewed the chart and discharge instructions (if applicable) and agree that the record reflects my personal performance and is accurate and complete. 04/03/2022.   I,Verona Buck,acting as a Education administrator for Penni Homans, MD.,have documented all  relevant documentation on the behalf of Penni Homans, MD,as directed by  Penni Homans, MD while in the presence of Penni Homans, MD.    Luna Glasgow

## 2022-04-03 NOTE — Assessment & Plan Note (Signed)
Still struggling

## 2022-04-03 NOTE — Assessment & Plan Note (Signed)
hgba1c acceptable, minimize simple carbs. Increase exercise as tolerated. Continue current meds 

## 2022-04-03 NOTE — Assessment & Plan Note (Signed)
Well controlled, no changes to meds. Encouraged heart healthy diet such as the DASH diet and exercise as tolerated.  °

## 2022-04-03 NOTE — Patient Instructions (Addendum)
Plain Mucinex twice dailyAcute Bronchitis, Adult If no response let us know and we will consider a treatment for RLS  Hyland's leg cramp medicine   Restless Legs Syndrome Restless legs syndrome is a condition that causes uncomfortable feelings or sensations in the legs, especially while sitting or lying down. The sensations usually cause an overwhelming urge to move the legs. The arms can also sometimes be affected. The condition can range from mild to severe. The symptoms often interfere with a person's ability to sleep. What are the causes? The cause of this condition is not known. What increases the risk? The following factors may make you more likely to develop this condition: Being older than 50. Pregnancy. Being a woman. In general, the condition is more common in women than in men. A family history of the condition. Having iron deficiency. Overuse of caffeine, nicotine, or alcohol. Certain medical conditions, such as kidney disease, Parkinson's disease, or nerve damage. Certain medicines, such as those for high blood pressure, nausea, colds, allergies, depression, and some heart conditions. What are the signs or symptoms? The main symptom of this condition is uncomfortable sensations in the legs, such as: Pulling. Tingling. Prickling. Throbbing. Crawling. Burning. Usually, the sensations: Affect both sides of the body. Are worse when you sit or lie down. Are worse at night. These may make it difficult to fall asleep. Make you have a strong urge to move your legs. Are temporarily relieved by moving your legs or standing. The arms can also be affected, but this is rare. People who have this condition often have tiredness during the day because of their lack of sleep at night. How is this diagnosed? This condition may be diagnosed based on: Your symptoms. Blood tests. In some cases, you may be monitored in a sleep lab by a specialist (a sleep study). This can detect any  disruptions in your sleep. How is this treated? This condition is treated by managing the symptoms. This may include: Lifestyle changes, such as exercising, using relaxation techniques, and avoiding caffeine, alcohol, or tobacco. Iron supplements. Medicines. Parkinson's medications may be tried first. Anti-seizure medications can also be helpful. Follow these instructions at home: General instructions Take over-the-counter and prescription medicines only as told by your health care provider. Use methods to help relieve the uncomfortable sensations, such as: Massaging your legs. Walking or stretching. Taking a cold or hot bath. Keep all follow-up visits. This is important. Lifestyle     Practice good sleep habits. For example, go to bed and get up at the same time every day. Most adults should get 7-9 hours of sleep each night. Exercise regularly. Try to get at least 30 minutes of exercise most days of the week. Practice ways of relaxing, such as yoga or meditation. Avoid caffeine and alcohol. Do not use any products that contain nicotine or tobacco. These products include cigarettes, chewing tobacco, and vaping devices, such as e-cigarettes. If you need help quitting, ask your health care provider. Where to find more information [employer] of Neurological Disorders and Stroke: [employer] Contact a health care provider if: Your symptoms get worse or they do not improve with treatment. Summary Restless legs syndrome is a condition that causes uncomfortable feelings or sensations in the legs, especially while sitting or lying down. The symptoms often interfere with your ability to sleep. This condition is treated by managing the symptoms. You may need to make lifestyle changes or take medicines. This information is not intended to replace advice given to you  by your health care provider. Make sure you discuss any questions you have with your health care provider. Document  Revised: 12/26/2020 Document Reviewed: 12/26/2020 Elsevier Patient Education  [place].   Acute bronchitis is sudden inflammation of the main airways (bronchi) that come off the windpipe (trachea) in the lungs. The swelling causes the airways to get smaller and make more mucus than normal. This can make it hard to breathe and can cause coughing or noisy breathing (wheezing). Acute bronchitis may last several weeks. The cough may last longer. Allergies, asthma, and exposure to smoke may make the condition worse. What are the causes? This condition can be caused by germs and by substances that irritate the lungs, including: Cold and flu viruses. The most common cause of this condition is the virus that causes the common cold. Bacteria. This is less common. Breathing in substances that irritate the lungs, including: Smoke from cigarettes and other forms of tobacco. Dust and pollen. Fumes from household cleaning products, gases, or burned fuel. Indoor or outdoor air pollution. What increases the risk? The following factors may make you more likely to develop this condition: A weak body's defense system, also called the immune system. A condition that affects your lungs and breathing, such as asthma. What are the signs or symptoms? Common symptoms of this condition include: Coughing. This may bring up clear, yellow, or green mucus from your lungs (sputum). Wheezing. Runny or stuffy nose. Having too much mucus in your lungs (chest congestion). Shortness of breath. Aches and pains, including sore throat or chest. How is this diagnosed? This condition is usually diagnosed based on: Your symptoms and medical history. A physical exam. You may also have other tests, including tests to rule out other conditions, such as pneumonia. These tests include: A test of lung function. Test of a mucus sample to look for the presence of bacteria. Tests to check the oxygen level in your  blood. Blood tests. Chest X-ray. How is this treated? Most cases of acute bronchitis clear up over time without treatment. Your health care provider may recommend: Drinking more fluids to help thin your mucus so it is easier to cough up. Taking inhaled medicine (inhaler) to improve air flow in and out of your lungs. Using a vaporizer or a humidifier. These are machines that add water to the air to help you breathe better. Taking a medicine that thins mucus and clears congestion (expectorant). Taking a medicine that prevents or stops coughing (cough suppressant). It is not common to take an antibiotic medicine for this condition. Follow these instructions at home:  Take over-the-counter and prescription medicines only as told by your health care provider. Use an inhaler, vaporizer, or humidifier as told by your health care provider. Take two teaspoons (10 mL) of honey at bedtime to lessen coughing at night. Drink enough fluid to keep your urine pale yellow. Do not use any products that contain nicotine or tobacco. These products include cigarettes, chewing tobacco, and vaping devices, such as e-cigarettes. If you need help quitting, ask your health care provider. Get plenty of rest. Return to your normal activities as told by your health care provider. Ask your health care provider what activities are safe for you. Keep all follow-up visits. This is important. How is this prevented? To lower your risk of getting this condition again: Wash your hands often with soap and water for at least 20 seconds. If soap and water are not available, use hand sanitizer. Avoid contact with people  who have cold symptoms. Try not to touch your mouth, nose, or eyes with your hands. Avoid breathing in smoke or chemical fumes. Breathing smoke or chemical fumes will make your condition worse. Get the flu shot every year. Contact a health care provider if: Your symptoms do not improve after 2 weeks. You have  trouble coughing up the mucus. Your cough keeps you awake at night. You have a fever. Get help right away if you: Cough up blood. Feel pain in your chest. Have severe shortness of breath. Faint or keep feeling like you are going to faint. Have a severe headache. Have a fever or chills that get worse. These symptoms may represent a serious problem that is an emergency. Do not wait to see if the symptoms will go away. Get medical help right away. Call your local emergency services (911 in the [place] Do not drive yourself to the hospital. Summary Acute bronchitis is inflammation of the main airways (bronchi) that come off the windpipe (trachea) in the lungs. The swelling causes the airways to get smaller and make more mucus than normal. Drinking more fluids can help thin your mucus so it is easier to cough up. Take over-the-counter and prescription medicines only as told by your health care provider. Do not use any products that contain nicotine or tobacco. These products include cigarettes, chewing tobacco, and vaping devices, such as e-cigarettes. If you need help quitting, ask your health care provider. Contact a health care provider if your symptoms do not improve after 2 weeks. This information is not intended to replace advice given to you by your health care provider. Make sure you discuss any questions you have with your health care provider. Document Revised: 08/25/2021 Document Reviewed: 09/15/2020 Elsevier Patient Education  [place].

## 2022-04-03 NOTE — Assessment & Plan Note (Signed)
Elevated CRP as well since her back surgery, repeat labs today

## 2022-04-05 ENCOUNTER — Ambulatory Visit: Payer: Medicare Other

## 2022-04-05 DIAGNOSIS — R2689 Other abnormalities of gait and mobility: Secondary | ICD-10-CM

## 2022-04-05 DIAGNOSIS — M5459 Other low back pain: Secondary | ICD-10-CM

## 2022-04-05 DIAGNOSIS — M6281 Muscle weakness (generalized): Secondary | ICD-10-CM

## 2022-04-05 NOTE — Assessment & Plan Note (Signed)
Given a medrol dosepak and some cough syrup.Encouraged increased rest and hydration, add probiotics, zinc such as Coldeze or Xicam. Treat fevers as needed

## 2022-04-05 NOTE — Therapy (Signed)
OUTPATIENT PHYSICAL THERAPY TREATMENT   Patient Name: [name] MRN: [id number] DOB:[birth year], 66 y.o., female Today's Date: 03/24/2022   PT End of Session - 04/05/22 1003     Visit Number 4    Date for PT Re-Evaluation 05/22/22    Authorization Type [employer] Medicare    PT Start Time 0932    PT Stop Time 1013    PT Time Calculation (min) 41 min    Activity Tolerance Patient tolerated treatment well    Behavior During Therapy [facility] for tasks assessed/performed                Past Medical History:  Diagnosis Date   Allergic rhinitis    Allergy    Anemia 07/17/2016   Anxiety    Asthma    seasonal   Blood transfusion without reported diagnosis    Breast cancer ([place])    right   Bronchitis    Colon polyps    CPAP (continuous positive airway pressure) dependence    Depression    Diabetes mellitus    borderline but takes metformin   Edema    Family history of breast cancer in first degree relative    Fibromyalgia    GERD (gastroesophageal reflux disease)    Hyperglycemia 03/05/2017   Hyperlipidemia 08/21/2016   no meds   Hyperparathyroidism    Hypertension    controlled by medications   Joint pain    Neuromuscular disorder ([place])    Fibromyalgia   Obesity    Osteoarthritis    RA   PONV (postoperative nausea and vomiting)    occasional   Pre-diabetes    Sleep apnea    wears cpap   Viral meningitis    Vitamin D deficiency     Past Surgical History:  Procedure Laterality Date   ABDOMINAL HYSTERECTOMY  1998   ADENOIDECTOMY     BACK SURGERY     09/21/21   BREAST EXCISIONAL BIOPSY Left 1993   benign   BREAST SURGERY     Tran flap due to breast cancer   CESAREAN SECTION  1988   COLONOSCOPY  08/14/2011   HAND SURGERY     dog bite, right hand   HAND SURGERY     trauma, left hand   KNEE ARTHROSCOPY     bilateral   left knee replacement  2010   LUMBAR WOUND DEBRIDEMENT N/A 10/07/2021   Procedure: LUMBAR WOUND DEBRIDEMENT/REVISION;  Surgeon:  Ashok Pall, MD;  Location: [place];  Service: Neurosurgery;  Laterality: N/A;  3C/RM 21 to follow Dr Kathyrn Sheriff   MASTECTOMY  01/13/1994   Right breast   parathyroid resection     PARATHYROIDECTOMY     POLYPECTOMY     rectal abscess     SEPTOPLASTY  1980   SHOULDER ARTHROSCOPY Right 11/09/2015   Procedure: ARTHROSCOPY SHOULDER-acromioplasty, distal clavicle resection and debridement;  Surgeon: Melrose Nakayama, MD;  Location: [place];  Service: Orthopedics;  Laterality: Right;   shoulder arthroscopy     rotator cuff repair   TOE SURGERY Right    paronychia and adenoma removed   TONSILLECTOMY     TOTAL KNEE ARTHROPLASTY  06/18/2008   [place]   TUMOR REMOVAL  1982   , scalp    Patient Active Problem List   Diagnosis Date Noted   Elevated sed rate 04/03/2022   Viral bronchitis 03/22/2022   Low back pain 01/11/2022   Drainage from wound 10/07/2021   Spondylolisthesis of lumbar region 09/21/2021   DDD (  degenerative disc disease), cervical 05/04/2021   Neck pain 05/02/2021   Left arm pain 01/21/2021   Memory changes 04/19/2020   Educated about COVID-19 virus infection 04/19/2020   Peripheral neuropathy 04/19/2020   TMJ arthralgia 04/19/2020   Urinary incontinence 10/27/2019   Other peripheral vertigo, unspecified ear 10/27/2019   Vertigo 03/14/2018   RLS (restless legs syndrome) 06/07/2017   Right knee pain 12/06/2016   Arthralgia 12/03/2016   Hyperlipidemia 08/21/2016   Anemia 07/17/2016   Genetic testing 05/15/2016   Allergic urticaria 03/27/2016   Dermographia 03/27/2016   History of breast cancer 03/27/2016   Rheumatoid arthritis ([place]) 06/24/2015   Diverticulitis of colon without hemorrhage 09/21/2014   Asthma with acute exacerbation 07/14/2014   Cough 07/06/2014   Gout of big toe 03/10/2013   Preventative health care 10/08/2012   OSA (obstructive sleep apnea) 04/23/2012   Diabetes mellitus, type 2 ([place]) 12/10/2011   Diarrhea 09/04/2011   Personal history of colonic  polyps - adenoma 08/14/2011   Fatigue 08/03/2011   Ear canal dryness 08/03/2011   Palpitations 09/21/2010   Morbid obesity ([place]) 09/21/2010   PAC (premature atrial contraction) 09/21/2010   ATTENTION DEFICIT DISORDER, INATTENTIVE TYPE 09/22/2009   Acute sinusitis 09/22/2009   VIRAL MENINGITIS, HX OF 04/29/2009   Insomnia 02/15/2009   Vitamin D deficiency 03/11/2008   Fibromyalgia 03/05/2008   HYPERPARATHYROIDISM, HX OF 08/06/2007   Allergic rhinitis 07/05/2007   Osteoarthritis 02/28/2007   Edema 11/01/2006   Depression with anxiety 06/25/2006   Essential hypertension 06/25/2006   Gastroesophageal reflux disease without esophagitis 06/25/2006     PCP: Mosie Lukes, MD  REFERRING PROVIDER: Mosie Lukes, MD  REFERRING DIAG: M54.41 (ICD-10-CM) - Right-sided low back pain with right-sided sciatica, unspecified chronicity  Rationale for Evaluation and Treatment: Rehabilitation  THERAPY DIAG:  Other low back pain  Muscle weakness (generalized)  Other abnormalities of gait and mobility   ONSET DATE: 09/21/21 - LUMBAR THREE-FOUR, LUMBAR FOUR-FIVE POSTERIOR LUMBAR INTERBODY FUSION   SUBJECTIVE:                                                                                                                                                                                           SUBJECTIVE STATEMENT: Pt reports noticing more mobility in her back but sore today from the last session  PERTINENT HISTORY:  Diabetes, Osteoarthritis, Anxiety, GERD, R shoulder replacement (10/2015), L TKA, Back surgery (4/23)  PAIN:  Are you having pain? No: NPRS scale: 0/10 Pain location: N/A Pain description: N/A Aggravating factors: moving in bed, bending forward, toilet hygiene  Relieving factors: ibuprofen, Tylenol, ice  PRECAUTIONS: None  WEIGHT  BEARING RESTRICTIONS: No  FALLS:  Has patient fallen in last 6 months? No  LIVING ENVIRONMENT: Lives with: lives alone Lives in:  House/apartment Stairs: No Has following equipment at home: Single point cane and elevated commode with bidet   OCCUPATION: PT - accounting, mostly deskwork  PLOF: Independent, Needs assistance with ADLs (difficulty with toilet hygiene), and Leisure: time with family.  PATIENT GOALS: "To be able to walk and be active again. To be able to clean myself after using the toilet. To be able to bend over an pick things up."   OBJECTIVE:   DIAGNOSTIC FINDINGS:  MRI Lumbar Spine w/o Contrast: 01/07/22  IMPRESSION: 1. Postsurgical changes from L3-L5 PLIF. Bone marrow signal changes within the spinous processes and bilateral lamina of L2, L3, and L4 raise the suspicion for osteomyelitis. 2. Previously described T2 hyperintense material along the right lateral and dorsal epidural space at the L2 and L2-3 levels is not seen on the current study. No IV contrast was administered on the current exam. No new epidural space abnormality is identified. 3. Similar degree of canal stenosis at L2-3.  CT Lumbar Spine w/o Contrast: 01/07/22 IMPRESSION: 1. No CT evidence for acute abnormality within the lumbar spine. 2. Postoperative changes from prior PLIF at L3-4 and L4-5. Mild subsidence at L3-4. No other hardware complication. 3. Underlying degenerative spondylosis and facet arthrosis, better appreciated and described on recent MRIs.  PATIENT SURVEYS:  Modified Oswestry:  19 / 50 = 38.0 %  FOTO: NT  SCREENING FOR RED FLAGS: Bowel or bladder incontinence: Yes: bladder (urge incontinence) Spinal tumors: No Cauda equina syndrome: No Compression fracture: No Abdominal aneurysm: No  COGNITION: Overall cognitive status: Within functional limits for tasks assessed     SENSATION: WFL Intermittent brief R LE radicular pain  MUSCLE LENGTH: Hamstrings: mild tight B Quads: mod tight B  POSTURE: No Significant postural limitations  PALPATION: Lumbar paraspinals & glutes - muscle tension WNL w/o  TTP  LUMBAR ROM:   AROM eval  Flexion <25%  Extension 50%  Right lateral flexion 25%  Left lateral flexion 25%  Right rotation WNL  Left rotation WNL   (Blank rows = not tested)  LOWER EXTREMITY ROM:     Grossly WFL but accessory trunk lean in standing   LOWER EXTREMITY MMT:    MMT Right eval Left eval  Hip flexion 2+ 3+  Hip extension 2+ 2+  Hip abduction 4- 4  Hip adduction 2+ 3+  Hip internal rotation 4+ 4+  Hip external rotation 4+ 4+  Knee flexion 5 5  Knee extension 5 5  Ankle dorsiflexion 5 4  Ankle plantarflexion    Ankle inversion    Ankle eversion     (Blank rows = not tested)  LUMBAR SPECIAL TESTS:  Straight leg raise test: Negative   GAIT: Distance walked: 60 feet, back and forth from exam room Assistive device utilized: Single point cane Level of assistance: Modified independence Comments: increased lumbar extension  TODAY'S TREATMENT:  DATE:  04/05/22 THERAPEUTIC EXERCISE: to improve flexibility, strength and mobility.  Verbal and tactile cues throughout for technique.  Rec Bike L2 x 6 min Prone quad stretch with strap 2x30 sec each Prone HS curls x 10  Prone hip extension x 10  Supine hip ADD ball squeeze 2x10 with TrA activation Supine hip extension green TB x 10 R/L Standing hip abduction 2 x 10 R/L Standing extension 2 x 10 R/L  04/03/22 THERAPEUTIC EXERCISE: to improve flexibility, strength and mobility.  Verbal and tactile cues throughout for technique.  Rec Bike L2 x 6 min Seated fwd flexion rollout with green pball 10x -cues for 3 sec hold in fwd flexed position Seated iso hip add ball squeeze 2x10 with 5 sec hold Seated iso hip add ball squeeze with LAQ 1x10 Supine TrA brace + alt march with RTB 10x with 3 sec hold- cues to for TrA contraction Supine TrA brace +alt march with BLE hold in between with RTB 10x-  cues for sequencing for LE and breathing during activity Supine SLR R & L x10- cues for TrA contraction Standing ext at wall 3x15 STS with table elevated to 45 hip flexion  2x10- cues to use hands on table instead of anterior thighs  03/30/22 Therapeutic Exercise: Rec Bike [id number] Seated fwd flexion rollout with green pball x 10 Seated marching x 10 bil Seated LAQ x 10 bil Standing lumbar extension at wall 10x 3 sec hold Supine TrA brace with PPT x 10 3 sec hold Supine march with red TB TrA brace x 10  Supine clams with red TB TrA brace x 10      PATIENT EDUCATION:  Education details: HEP progression - hip isometric add, supine SLR, supine TrA with alt marches Person educated: Patient Education method: EConsulting civil engineer DMedia planner Verbal cues, and Handouts Education comprehension: verbalized understanding and returned demonstration  HOME EXERCISE PROGRAM: Access Code: [email or web address] URL: [email or web address] Date: 04/03/2022 Prepared by: OZeb Comfort Exercises - Hooklying Hamstring Stretch with Strap  - 2-3 x daily - 7 x weekly - 3 reps - 30 sec hold - Standing Back Extension at [place] - 2-3 x daily - 7 x weekly - 2 sets - 15 reps - 3 sec hold - QTourist information centre managerwith Chair and Counter Support  - 2-3 x daily - 7 x weekly - 3 reps - 30 sec hold - Supine Posterior Pelvic Tilt  - 1 x daily - 7 x weekly - 3 sets - 10 reps - Hooklying Clamshell with Resistance  - 1 x daily - 7 x weekly - 2 sets - 10 reps - Supine March with Resistance Band  - 1 x daily - 7 x weekly - 3 sets - 10 reps - Seated Hip Adduction Isometrics with Ball  - 1 x daily - 7 x weekly - 2 sets - 10 reps - 5 hold - Supine March with Alternating Leg Lifts  - 1 x daily - 7 x weekly - 3 sets - 10 reps - Supine Pelvic Tilt with Straight Leg Raise  - 1 x daily - 7 x weekly - 2 sets - 10 reps  ASSESSMENT:  CLINICAL IMPRESSION: [name] continues to report increased mobility and improved pain. She had reports  of soreness in her quads so we did stretching for these muscle groups. Continued with progression of LE strengthening with no issues. She was able to walk around clinic w/o support of cane and able to do more standing exercises but more fatigued afterwards.  OBJECTIVE IMPAIRMENTS:  Abnormal gait, decreased activity tolerance, decreased balance, decreased endurance, decreased mobility, difficulty walking, decreased ROM, decreased strength, hypomobility, impaired perceived functional ability, impaired flexibility, improper body mechanics, and pain.   ACTIVITY LIMITATIONS: carrying, lifting, bending, sitting, standing, squatting, stairs, bed mobility, continence, bathing, toileting, dressing, and locomotion level  PARTICIPATION LIMITATIONS: cleaning, laundry, shopping, community activity, occupation, and attending festivals and ball games with grandchildren  PERSONAL FACTORS: Past/current experiences, Time since onset of injury/illness/exacerbation, and 3+ comorbidities: Diabetes, Osteoarthritis, Anxiety, GERD, R shoulder replacement (10/2015), and L TKA are also affecting patient's functional outcome.   REHAB POTENTIAL: Good  CLINICAL DECISION MAKING: Evolving/moderate complexity  EVALUATION COMPLEXITY: Moderate   GOALS: Goals reviewed with patient? Yes  SHORT TERM GOALS: Target date: 04/24/2022  Pt will perform HEP independently to demonstrate understanding of activity. Baseline: Goal status: IN PROGRESS  2.  Pt will have a 25% reduction in pain during mobility to improve everyday activity. Baseline:  Goal status: IN PROGRESS  3.  Pt will have a 25% increase in lumbar mobility to perform ADLs such as toileting and bathing. Baseline:  Goal status: IN PROGRESS  4.  Pt will improve strength of proximal hip muscles to >/= 3/5 to increase ability to stand from a chair and walk.  Baseline:  Goal status: IN PROGRESS    LONG TERM GOALS: Target date: 05/22/22  Pt will perform HEP  independently to demonstrate understanding of activity. Baseline:  Goal status: IN PROGRESS  2.  Pt will have 75% reduction in pain during mobility to improve everyday activity. Baseline:  Goal status: IN PROGRESS  3. Pt will have a 75% increase in lumbar mobility to perform ADLs such as toileting and bathing. Baseline:  Goal status: IN PROGRESS  4.  Pt will improve strength of proximal hip muscles to >/=4-/5 to increase ability to stand from a chair and walk.  Baseline:  Goal status: IN PROGRESS  5.  Pt will improve modified Oswestry to 26% to show increase in functional activity. Baseline:  19 / 50 = 38.0 % Goal status: IN PROGRESS  6.  Pt will perform STS with modified independence using single point cane to demonstrate independence and ability to stand from chair.  Baseline:  Goal status: IN PROGRESS   PLAN:  PT FREQUENCY: 2x/week  PT DURATION: 8 weeks  PLANNED INTERVENTIONS: Therapeutic exercises, Therapeutic activity, Neuromuscular re-education, Balance training, Gait training, Patient/Family education, Self Care, Joint mobilization, Joint manipulation, Stair training, Dry Needling, Electrical stimulation, Spinal manipulation, Spinal mobilization, Cryotherapy, Moist heat, Manual therapy, and Re-evaluation.  PLAN FOR NEXT SESSION: review HEP, progress lumbopelvic mobility and core strengthening, progress proximal hip muscle strengthening   Clarene Essex, PTA 03/24/2022, 11:44 AM

## 2022-04-10 ENCOUNTER — Encounter: Payer: Self-pay | Admitting: Physical Therapy

## 2022-04-10 ENCOUNTER — Ambulatory Visit: Payer: Medicare Other | Admitting: Physical Therapy

## 2022-04-10 DIAGNOSIS — M5459 Other low back pain: Secondary | ICD-10-CM | POA: Diagnosis not present

## 2022-04-10 DIAGNOSIS — R2689 Other abnormalities of gait and mobility: Secondary | ICD-10-CM

## 2022-04-10 DIAGNOSIS — M6281 Muscle weakness (generalized): Secondary | ICD-10-CM

## 2022-04-10 NOTE — Therapy (Addendum)
OUTPATIENT PHYSICAL THERAPY TREATMENT   Patient Name: [name] MRN: [id number] DOB:[birth year], 66 y.o., female Today's Date: 03/24/2022   PT End of Session - 04/10/22 0930     Visit Number 5    Date for PT Re-Evaluation 05/22/22    Authorization Type [employer] Medicare    PT Start Time 0930    PT Stop Time 3762    PT Time Calculation (min) 45 min    Activity Tolerance Patient tolerated treatment well    Behavior During Therapy [facility] for tasks assessed/performed                Past Medical History:  Diagnosis Date   Allergic rhinitis    Allergy    Anemia 07/17/2016   Anxiety    Asthma    seasonal   Blood transfusion without reported diagnosis    Breast cancer ([place])    right   Bronchitis    Colon polyps    CPAP (continuous positive airway pressure) dependence    Depression    Diabetes mellitus    borderline but takes metformin   Edema    Family history of breast cancer in first degree relative    Fibromyalgia    GERD (gastroesophageal reflux disease)    Hyperglycemia 03/05/2017   Hyperlipidemia 08/21/2016   no meds   Hyperparathyroidism    Hypertension    controlled by medications   Joint pain    Neuromuscular disorder ([place])    Fibromyalgia   Obesity    Osteoarthritis    RA   PONV (postoperative nausea and vomiting)    occasional   Pre-diabetes    Sleep apnea    wears cpap   Viral meningitis    Vitamin D deficiency     Past Surgical History:  Procedure Laterality Date   ABDOMINAL HYSTERECTOMY  1998   ADENOIDECTOMY     BACK SURGERY     09/21/21   BREAST EXCISIONAL BIOPSY Left 1993   benign   BREAST SURGERY     Tran flap due to breast cancer   CESAREAN SECTION  1988   COLONOSCOPY  08/14/2011   HAND SURGERY     dog bite, right hand   HAND SURGERY     trauma, left hand   KNEE ARTHROSCOPY     bilateral   left knee replacement  2010   LUMBAR WOUND DEBRIDEMENT N/A 10/07/2021   Procedure: LUMBAR WOUND DEBRIDEMENT/REVISION;  Surgeon:  Ashok Pall, MD;  Location: [facility];  Service: Neurosurgery;  Laterality: N/A;  3C/RM 21 to follow Dr Kathyrn Sheriff   MASTECTOMY  01/13/1994   Right breast   parathyroid resection     PARATHYROIDECTOMY     POLYPECTOMY     rectal abscess     SEPTOPLASTY  1980   SHOULDER ARTHROSCOPY Right 11/09/2015   Procedure: ARTHROSCOPY SHOULDER-acromioplasty, distal clavicle resection and debridement;  Surgeon: Melrose Nakayama, MD;  Location: [facility];  Service: Orthopedics;  Laterality: Right;   shoulder arthroscopy     rotator cuff repair   TOE SURGERY Right    paronychia and adenoma removed   TONSILLECTOMY     TOTAL KNEE ARTHROPLASTY  06/18/2008   [place]   TUMOR REMOVAL  1982   , scalp    Patient Active Problem List   Diagnosis Date Noted   Elevated sed rate 04/03/2022   Viral bronchitis 03/22/2022   Low back pain 01/11/2022   Drainage from wound 10/07/2021   Spondylolisthesis of lumbar region 09/21/2021   DDD (  degenerative disc disease), cervical 05/04/2021   Neck pain 05/02/2021   Left arm pain 01/21/2021   Memory changes 04/19/2020   Educated about COVID-19 virus infection 04/19/2020   Peripheral neuropathy 04/19/2020   TMJ arthralgia 04/19/2020   Urinary incontinence 10/27/2019   Other peripheral vertigo, unspecified ear 10/27/2019   Vertigo 03/14/2018   RLS (restless legs syndrome) 06/07/2017   Right knee pain 12/06/2016   Arthralgia 12/03/2016   Hyperlipidemia 08/21/2016   Anemia 07/17/2016   Genetic testing 05/15/2016   Allergic urticaria 03/27/2016   Dermographia 03/27/2016   History of breast cancer 03/27/2016   Rheumatoid arthritis ([place]) 06/24/2015   Diverticulitis of colon without hemorrhage 09/21/2014   Asthma with acute exacerbation 07/14/2014   Cough 07/06/2014   Gout of big toe 03/10/2013   Preventative health care 10/08/2012   OSA (obstructive sleep apnea) 04/23/2012   Diabetes mellitus, type 2 ([place]) 12/10/2011   Diarrhea 09/04/2011   Personal history of colonic  polyps - adenoma 08/14/2011   Fatigue 08/03/2011   Ear canal dryness 08/03/2011   Palpitations 09/21/2010   Morbid obesity ([place]) 09/21/2010   PAC (premature atrial contraction) 09/21/2010   ATTENTION DEFICIT DISORDER, INATTENTIVE TYPE 09/22/2009   Acute sinusitis 09/22/2009   VIRAL MENINGITIS, HX OF 04/29/2009   Insomnia 02/15/2009   Vitamin D deficiency 03/11/2008   Fibromyalgia 03/05/2008   HYPERPARATHYROIDISM, HX OF 08/06/2007   Allergic rhinitis 07/05/2007   Osteoarthritis 02/28/2007   Edema 11/01/2006   Depression with anxiety 06/25/2006   Essential hypertension 06/25/2006   Gastroesophageal reflux disease without esophagitis 06/25/2006     PCP: Mosie Lukes, MD  REFERRING PROVIDER: Mosie Lukes, MD  REFERRING DIAG: M54.41 (ICD-10-CM) - Right-sided low back pain with right-sided sciatica, unspecified chronicity  Rationale for Evaluation and Treatment: Rehabilitation  THERAPY DIAG:  Other low back pain  Muscle weakness (generalized)  Other abnormalities of gait and mobility   ONSET DATE: 09/21/21 - LUMBAR THREE-FOUR, LUMBAR FOUR-FIVE POSTERIOR LUMBAR INTERBODY FUSION   SUBJECTIVE:                                                                                                                                                                                           SUBJECTIVE STATEMENT: Pt is able to come to therapy today without using a cane. States she has been able to walk more without. She did walk around her house more yesterday and noticed some sharp pain in her buttocks.   PERTINENT HISTORY:  Diabetes, Osteoarthritis, Anxiety, GERD, R shoulder replacement (10/2015), L TKA, Back surgery (4/23)  PAIN:  Are you having pain? Yes: NPRS scale: 2/10 Pain location: buttocks  Pain description: discomfort Aggravating factors: moving in bed, bending forward, toilet hygiene  Relieving factors: ibuprofen, Tylenol, ice  PRECAUTIONS: None  WEIGHT BEARING  RESTRICTIONS: No  FALLS:  Has patient fallen in last 6 months? No  LIVING ENVIRONMENT: Lives with: lives alone Lives in: House/apartment Stairs: No Has following equipment at home: Single point cane and elevated commode with bidet   OCCUPATION: PT - accounting, mostly deskwork  PLOF: Independent, Needs assistance with ADLs (difficulty with toilet hygiene), and Leisure: time with family.  PATIENT GOALS: "To be able to walk and be active again. To be able to clean myself after using the toilet. To be able to bend over an pick things up."   OBJECTIVE:   DIAGNOSTIC FINDINGS:  MRI Lumbar Spine w/o Contrast: 01/07/22  IMPRESSION: 1. Postsurgical changes from L3-L5 PLIF. Bone marrow signal changes within the spinous processes and bilateral lamina of L2, L3, and L4 raise the suspicion for osteomyelitis. 2. Previously described T2 hyperintense material along the right lateral and dorsal epidural space at the L2 and L2-3 levels is not seen on the current study. No IV contrast was administered on the current exam. No new epidural space abnormality is identified. 3. Similar degree of canal stenosis at L2-3.  CT Lumbar Spine w/o Contrast: 01/07/22 IMPRESSION: 1. No CT evidence for acute abnormality within the lumbar spine. 2. Postoperative changes from prior PLIF at L3-4 and L4-5. Mild subsidence at L3-4. No other hardware complication. 3. Underlying degenerative spondylosis and facet arthrosis, better appreciated and described on recent MRIs.  PATIENT SURVEYS:  Modified Oswestry:  19 / 50 = 38.0 %  FOTO: NT  SCREENING FOR RED FLAGS: Bowel or bladder incontinence: Yes: bladder (urge incontinence) Spinal tumors: No Cauda equina syndrome: No Compression fracture: No Abdominal aneurysm: No  COGNITION: Overall cognitive status: Within functional limits for tasks assessed     SENSATION: WFL Intermittent brief R LE radicular pain  MUSCLE LENGTH: Hamstrings: mild tight B Quads:  mod tight B  POSTURE: No Significant postural limitations  PALPATION: Lumbar paraspinals & glutes - muscle tension WNL w/o TTP  LUMBAR ROM:   AROM eval  Flexion <25%  Extension 50%  Right lateral flexion 25%  Left lateral flexion 25%  Right rotation WNL  Left rotation WNL   (Blank rows = not tested)  LOWER EXTREMITY ROM:     Grossly WFL but accessory trunk lean in standing   LOWER EXTREMITY MMT:    MMT Right eval Left eval  Hip flexion 2+ 3+  Hip extension 2+ 2+  Hip abduction 4- 4  Hip adduction 2+ 3+  Hip internal rotation 4+ 4+  Hip external rotation 4+ 4+  Knee flexion 5 5  Knee extension 5 5  Ankle dorsiflexion 5 4  Ankle plantarflexion    Ankle inversion    Ankle eversion     (Blank rows = not tested)  LUMBAR SPECIAL TESTS:  Straight leg raise test: Negative   GAIT: Distance walked: 60 feet, back and forth from exam room Assistive device utilized: Single point cane Level of assistance: Modified independence Comments: increased lumbar extension  TODAY'S TREATMENT:  DATE:   04/10/22 THERAPEUTIC EXERCISE: to improve flexibility, strength and mobility.  Verbal and tactile cues throughout for technique. Rec Bike L2 x 6 min Supine Glute Bridges x10- cues for 5 sec hold  Hooklying PPT with heel walking out and in x10- cues for TrA contraction Seated hip ADD iso ball squeeze 10x5 sec hold Seated hip ADD iso ball squeeze + LAQ x10  Standing hip ABD with RTB at ankles x10 R/L Standing hip ext with RTB at ankles x10 R/L Standing hip flexion with RTB at ankles x10 R/L Heel Raise & Toe Raise 2x10  Mini Squat using chair for balance x10- cues to hinge at hip instead of knees GAIT TRAINING: To normalize gait pattern. 300 ft with no AD- cueing for reciprocal arm swing, heel to toe stepping, and looking forward during  walking  04/05/22 THERAPEUTIC EXERCISE: to improve flexibility, strength and mobility.  Verbal and tactile cues throughout for technique.  Rec Bike L2 x 6 min Prone quad stretch with strap 2x30 sec each Prone HS curls x 10  Prone hip extension x 10  Supine hip ADD ball squeeze 2x10 with TrA activation Supine hip extension green TB x 10 R/L Standing hip abduction 2 x 10 R/L Standing extension 2 x 10 R/L  04/03/22 THERAPEUTIC EXERCISE: to improve flexibility, strength and mobility.  Verbal and tactile cues throughout for technique.  Rec Bike L2 x 6 min Seated fwd flexion rollout with green pball 10x -cues for 3 sec hold in fwd flexed position Seated iso hip add ball squeeze 2x10 with 5 sec hold Seated iso hip add ball squeeze with LAQ 1x10 Supine TrA brace + alt march with RTB 10x with 3 sec hold- cues to for TrA contraction Supine TrA brace +alt march with BLE hold in between with RTB 10x- cues for sequencing for LE and breathing during activity Supine SLR R & L x10- cues for TrA contraction Standing ext at wall 3x15 STS with table elevated to 45 hip flexion  2x10- cues to use hands on table instead of anterior thighs   PATIENT EDUCATION:  Education details: HEP progression - standing hip abd, ext, flex, heel raise/toe raise Person educated: Patient Education method: Explanation, Demonstration, Verbal cues, and Handouts Education comprehension: verbalized understanding and returned demonstration  HOME EXERCISE PROGRAM: Access Code: [email or web address] URL: [email or web address] Date: 04/10/2022 Prepared by: Annie Paras  Exercises - Hooklying Hamstring Stretch with Strap  - 2-3 x daily - 7 x weekly - 3 reps - 30 sec hold - Standing Back Extension at [place]  - 2-3 x daily - 7 x weekly - 2 sets - 15 reps - 3 sec hold - Tourist information centre manager with Chair and Counter Support  - 2-3 x daily - 7 x weekly - 3 reps - 30 sec hold - Supine Posterior Pelvic Tilt  - 1 x daily - 7 x weekly - 3  sets - 10 reps - Hooklying Clamshell with Resistance  - 1 x daily - 7 x weekly - 2 sets - 10 reps - Supine March with Resistance Band  - 1 x daily - 7 x weekly - 3 sets - 10 reps - Seated Hip Adduction Isometrics with Ball  - 1 x daily - 7 x weekly - 2 sets - 10 reps - 5 hold - Supine March with Alternating Leg Lifts  - 1 x daily - 7 x weekly - 3 sets - 10 reps - Supine Pelvic Tilt with Straight Leg Raise  - 1 x  daily - 7 x weekly - 2 sets - 10 reps - Heel Toe Raises with Counter Support  - 1 x daily - 3-4 x weekly - 2 sets - 10 reps - 3 sec hold - Standing Hip Flexion with Resistance Loop  - 1 x daily - 3-4 x weekly - 2 sets - 10 reps - 3 sec hold - Hip Abduction with Resistance Loop  - 1 x daily - 3-4 x weekly - 2 sets - 10 reps - 3 sec hold - Hip Extension with Resistance Loop  - 1 x daily - 3-4 x weekly - 2 sets - 10 reps - 3 sec hold   ASSESSMENT:  CLINICAL IMPRESSION: Denntte continues to demonstrate improvement in lumbopelvic mobility and pain. She has been able to walk without using a cane but is still limited due to decreased endurance and strength. This session focused on TE for improvement of LE strengthening and gait training to normalize gait pattern with cueing to normalize reciprocal arm swing and heel to toe stepping pattern. Continued skilled therapy to address impairments.   OBJECTIVE IMPAIRMENTS:  Abnormal gait, decreased activity tolerance, decreased balance, decreased endurance, decreased mobility, difficulty walking, decreased ROM, decreased strength, hypomobility, impaired perceived functional ability, impaired flexibility, improper body mechanics, and pain.   ACTIVITY LIMITATIONS: carrying, lifting, bending, sitting, standing, squatting, stairs, bed mobility, continence, bathing, toileting, dressing, and locomotion level  PARTICIPATION LIMITATIONS: cleaning, laundry, shopping, community activity, occupation, and attending festivals and ball games with  grandchildren  PERSONAL FACTORS: Past/current experiences, Time since onset of injury/illness/exacerbation, and 3+ comorbidities: Diabetes, Osteoarthritis, Anxiety, GERD, R shoulder replacement (10/2015), and L TKA are also affecting patient's functional outcome.   REHAB POTENTIAL: Good  CLINICAL DECISION MAKING: Evolving/moderate complexity  EVALUATION COMPLEXITY: Moderate   GOALS: Goals reviewed with patient? Yes  SHORT TERM GOALS: Target date: 04/24/2022  Pt will perform HEP independently to demonstrate understanding of activity. Baseline: Goal status: MET 04/10/22  2.  Pt will have a 25% reduction in pain during mobility to improve everyday activity. Baseline:  Goal status: MET 04/10/22, Pt reports 100% improvement.  3.  Pt will have a 25% increase in lumbar mobility to perform ADLs such as toileting and bathing. Baseline:  Goal status: IN PROGRESS  4.  Pt will improve strength of proximal hip muscles to >/= 3/5 to increase ability to stand from a chair and walk.  Baseline:  Goal status: IN PROGRESS    LONG TERM GOALS: Target date: 05/22/22  Pt will perform HEP independently to demonstrate understanding of activity. Baseline:  Goal status: IN PROGRESS  2.  Pt will have 75% reduction in pain during mobility to improve everyday activity. Baseline:  Goal status: MET 04/10/22 Pt reports 100% improvement in pain.   3. Pt will have a 75% increase in lumbar mobility to perform ADLs such as toileting and bathing. Baseline:  Goal status: IN PROGRESS  4.  Pt will improve strength of proximal hip muscles to >/=4-/5 to increase ability to stand from a chair and walk.  Baseline:  Goal status: IN PROGRESS  5.  Pt will improve modified Oswestry to 26% to show increase in functional activity. Baseline:  19 / 50 = 38.0 % Goal status: IN PROGRESS  6.  Pt will perform STS with modified independence using single point cane to demonstrate independence and ability to stand from  chair.  Baseline:  Goal status: IN PROGRESS   PLAN:  PT FREQUENCY: 2x/week  PT DURATION: 8 weeks  PLANNED  INTERVENTIONS: Therapeutic exercises, Therapeutic activity, Neuromuscular re-education, Balance training, Gait training, Patient/Family education, Self Care, Joint mobilization, Joint manipulation, Stair training, Dry Needling, Electrical stimulation, Spinal manipulation, Spinal mobilization, Cryotherapy, Moist heat, Manual therapy, and Re-evaluation.  PLAN FOR NEXT SESSION: review HEP, progress lumbopelvic mobility and core strengthening, progress proximal hip muscle strengthening, include rotation mobility for ease of self cleaning, gait, balance  Zeb Comfort, Student-PT 04/10/22 10:15 am

## 2022-04-12 ENCOUNTER — Ambulatory Visit: Payer: Medicare Other

## 2022-04-12 DIAGNOSIS — M5459 Other low back pain: Secondary | ICD-10-CM | POA: Diagnosis not present

## 2022-04-12 DIAGNOSIS — M6281 Muscle weakness (generalized): Secondary | ICD-10-CM | POA: Diagnosis not present

## 2022-04-12 DIAGNOSIS — R2689 Other abnormalities of gait and mobility: Secondary | ICD-10-CM | POA: Diagnosis not present

## 2022-04-12 NOTE — Therapy (Addendum)
OUTPATIENT PHYSICAL THERAPY TREATMENT   Patient Name: [name] MRN: [id number] DOB:[birth year], 66 y.o., female Today's Date: 04/12/2022   PT End of Session - 04/12/22 0939     Visit Number 6    Date for PT Re-Evaluation 05/22/22    Authorization Type [employer] Medicare    PT Start Time 0931    PT Stop Time 4585    PT Time Calculation (min) 43 min    Activity Tolerance Patient tolerated treatment well    Behavior During Therapy [facility] for tasks assessed/performed                 Past Medical History:  Diagnosis Date   Allergic rhinitis    Allergy    Anemia 07/17/2016   Anxiety    Asthma    seasonal   Blood transfusion without reported diagnosis    Breast cancer ([place])    right   Bronchitis    Colon polyps    CPAP (continuous positive airway pressure) dependence    Depression    Diabetes mellitus    borderline but takes metformin   Edema    Family history of breast cancer in first degree relative    Fibromyalgia    GERD (gastroesophageal reflux disease)    Hyperglycemia 03/05/2017   Hyperlipidemia 08/21/2016   no meds   Hyperparathyroidism    Hypertension    controlled by medications   Joint pain    Neuromuscular disorder ([place])    Fibromyalgia   Obesity    Osteoarthritis    RA   PONV (postoperative nausea and vomiting)    occasional   Pre-diabetes    Sleep apnea    wears cpap   Viral meningitis    Vitamin D deficiency     Past Surgical History:  Procedure Laterality Date   [place] SURGERY     09/21/21   BREAST EXCISIONAL BIOPSY Left 1993   benign   BREAST SURGERY     Tran flap due to breast cancer   [place]   COLONOSCOPY  08/14/2011   HAND SURGERY     dog bite, right hand   HAND SURGERY     trauma, left hand   KNEE ARTHROSCOPY     bilateral   left knee replacement  2010   LUMBAR WOUND DEBRIDEMENT N/A 10/07/2021   Procedure: LUMBAR WOUND DEBRIDEMENT/REVISION;  Surgeon:  Ashok Pall, MD;  Location: [place];  Service: Neurosurgery;  Laterality: N/A;  3C/RM 21 to follow Dr Kathyrn Sheriff   MASTECTOMY  01/13/1994   Right breast   parathyroid resection     PARATHYROIDECTOMY     POLYPECTOMY     rectal abscess     SEPTOPLASTY  1980   SHOULDER ARTHROSCOPY Right 11/09/2015   Procedure: ARTHROSCOPY SHOULDER-acromioplasty, distal clavicle resection and debridement;  Surgeon: Melrose Nakayama, MD;  Location: [place];  Service: Orthopedics;  Laterality: Right;   shoulder arthroscopy     rotator cuff repair   TOE SURGERY Right    paronychia and adenoma removed   TONSILLECTOMY     TOTAL KNEE ARTHROPLASTY  06/18/2008   [place]   TUMOR REMOVAL  1982   , scalp    Patient Active Problem List   Diagnosis Date Noted   Elevated sed rate 04/03/2022   Viral bronchitis 03/22/2022   Low back pain 01/11/2022   Drainage from wound 10/07/2021   Spondylolisthesis of lumbar region 09/21/2021  DDD (degenerative disc disease), cervical 05/04/2021   Neck pain 05/02/2021   Left arm pain 01/21/2021   Memory changes 04/19/2020   Educated about COVID-19 virus infection 04/19/2020   Peripheral neuropathy 04/19/2020   TMJ arthralgia 04/19/2020   Urinary incontinence 10/27/2019   Other peripheral vertigo, unspecified ear 10/27/2019   Vertigo 03/14/2018   RLS (restless legs syndrome) 06/07/2017   Right knee pain 12/06/2016   Arthralgia 12/03/2016   Hyperlipidemia 08/21/2016   Anemia 07/17/2016   Genetic testing 05/15/2016   Allergic urticaria 03/27/2016   Dermographia 03/27/2016   History of breast cancer 03/27/2016   Rheumatoid arthritis ([place]) 06/24/2015   Diverticulitis of colon without hemorrhage 09/21/2014   Asthma with acute exacerbation 07/14/2014   Cough 07/06/2014   Gout of big toe 03/10/2013   Preventative health care 10/08/2012   OSA (obstructive sleep apnea) 04/23/2012   Diabetes mellitus, type 2 ([place]) 12/10/2011   Diarrhea 09/04/2011   Personal history of colonic  polyps - adenoma 08/14/2011   Fatigue 08/03/2011   Ear canal dryness 08/03/2011   Palpitations 09/21/2010   Morbid obesity ([place]) 09/21/2010   PAC (premature atrial contraction) 09/21/2010   ATTENTION DEFICIT DISORDER, INATTENTIVE TYPE 09/22/2009   Acute sinusitis 09/22/2009   VIRAL MENINGITIS, HX OF 04/29/2009   Insomnia 02/15/2009   Vitamin D deficiency 03/11/2008   Fibromyalgia 03/05/2008   HYPERPARATHYROIDISM, HX OF 08/06/2007   Allergic rhinitis 07/05/2007   Osteoarthritis 02/28/2007   Edema 11/01/2006   Depression with anxiety 06/25/2006   Essential hypertension 06/25/2006   Gastroesophageal reflux disease without esophagitis 06/25/2006     PCP: Mosie Lukes, MD  REFERRING PROVIDER: Mosie Lukes, MD  REFERRING DIAG: M54.41 (ICD-10-CM) - Right-sided low back pain with right-sided sciatica, unspecified chronicity  Rationale for Evaluation and Treatment: Rehabilitation  THERAPY DIAG:  Other low back pain  Muscle weakness (generalized)  Other abnormalities of gait and mobility   ONSET DATE: 09/21/21 - LUMBAR THREE-FOUR, LUMBAR FOUR-FIVE POSTERIOR LUMBAR INTERBODY FUSION   SUBJECTIVE:                                                                                                                                                                                           SUBJECTIVE STATEMENT: Pt reports she is very sore from last session, didn't do much yesterday.  PERTINENT HISTORY:  Diabetes, Osteoarthritis, Anxiety, GERD, R shoulder replacement (10/2015), L TKA, Back surgery (4/23)  PAIN:  Are you having pain? Yes: NPRS scale: 0/10 Pain location: buttocks Pain description: discomfort Aggravating factors: moving in bed, bending forward, toilet hygiene  Relieving factors: ibuprofen, Tylenol, ice  PRECAUTIONS: None  WEIGHT BEARING  RESTRICTIONS: No  FALLS:  Has patient fallen in last 6 months? No  LIVING ENVIRONMENT: Lives with: lives alone Lives in:  House/apartment Stairs: No Has following equipment at home: Single point cane and elevated commode with bidet   OCCUPATION: PT - accounting, mostly deskwork  PLOF: Independent, Needs assistance with ADLs (difficulty with toilet hygiene), and Leisure: time with family.  PATIENT GOALS: "To be able to walk and be active again. To be able to clean myself after using the toilet. To be able to bend over an pick things up."   OBJECTIVE:   DIAGNOSTIC FINDINGS:  MRI Lumbar Spine w/o Contrast: 01/07/22  IMPRESSION: 1. Postsurgical changes from L3-L5 PLIF. Bone marrow signal changes within the spinous processes and bilateral lamina of L2, L3, and L4 raise the suspicion for osteomyelitis. 2. Previously described T2 hyperintense material along the right lateral and dorsal epidural space at the L2 and L2-3 levels is not seen on the current study. No IV contrast was administered on the current exam. No new epidural space abnormality is identified. 3. Similar degree of canal stenosis at L2-3.  CT Lumbar Spine w/o Contrast: 01/07/22 IMPRESSION: 1. No CT evidence for acute abnormality within the lumbar spine. 2. Postoperative changes from prior PLIF at L3-4 and L4-5. Mild subsidence at L3-4. No other hardware complication. 3. Underlying degenerative spondylosis and facet arthrosis, better appreciated and described on recent MRIs.  PATIENT SURVEYS:  Modified Oswestry:  19 / 50 = 38.0 %  FOTO: NT  SCREENING FOR RED FLAGS: Bowel or bladder incontinence: Yes: bladder (urge incontinence) Spinal tumors: No Cauda equina syndrome: No Compression fracture: No Abdominal aneurysm: No  COGNITION: Overall cognitive status: Within functional limits for tasks assessed     SENSATION: WFL Intermittent brief R LE radicular pain  MUSCLE LENGTH: Hamstrings: mild tight B Quads: mod tight B  POSTURE: No Significant postural limitations  PALPATION: Lumbar paraspinals & glutes - muscle tension WNL w/o  TTP  LUMBAR ROM:   AROM eval  Flexion <25%  Extension 50%  Right lateral flexion 25%  Left lateral flexion 25%  Right rotation WNL  Left rotation WNL   (Blank rows = not tested)  LOWER EXTREMITY ROM:     Grossly WFL but accessory trunk lean in standing   LOWER EXTREMITY MMT:    MMT Right eval Left eval  Hip flexion 2+ 3+  Hip extension 2+ 2+  Hip abduction 4- 4  Hip adduction 2+ 3+  Hip internal rotation 4+ 4+  Hip external rotation 4+ 4+  Knee flexion 5 5  Knee extension 5 5  Ankle dorsiflexion 5 4  Ankle plantarflexion    Ankle inversion    Ankle eversion     (Blank rows = not tested)  LUMBAR SPECIAL TESTS:  Straight leg raise test: Negative   GAIT: Distance walked: 60 feet, back and forth from exam room Assistive device utilized: Single point cane Level of assistance: Modified independence Comments: increased lumbar extension  TODAY'S TREATMENT:  DATE:  04/12/22 THERAPEUTIC EXERCISE: to improve flexibility, strength and mobility.  Verbal and tactile cues throughout for technique. Rec Bike L2 x 6 min Step ups fwd 2x5  BLE - 1 sit break - UE support Standing high knees 2x5 - UE support STS x 12 reps from elevated seat ( 2 airex pads) Supine heel slides x 10 BLE  Manual Therapy: STM to R quads and gastroc Passive stretch R quads and gastroc  04/10/22 THERAPEUTIC EXERCISE: to improve flexibility, strength and mobility.  Verbal and tactile cues throughout for technique. Rec Bike L2 x 6 min Supine Glute Bridges x10- cues for 5 sec hold  Hooklying PPT with heel walking out and in x10- cues for TrA contraction Seated hip ADD iso ball squeeze 10x5 sec hold Seated hip ADD iso ball squeeze + LAQ x10  Standing hip ABD with RTB at ankles x10 R/L Standing hip ext with RTB at ankles x10 R/L Standing hip flexion with RTB at ankles x10  R/L Heel Raise & Toe Raise 2x10  Mini Squat using chair for balance x10- cues to hinge at hip instead of knees GAIT TRAINING: To normalize gait pattern. 300 ft with no AD- cueing for reciprocal arm swing, heel to toe stepping, and looking forward during walking  04/05/22 THERAPEUTIC EXERCISE: to improve flexibility, strength and mobility.  Verbal and tactile cues throughout for technique.  Rec Bike L2 x 6 min Prone quad stretch with strap 2x30 sec each Prone HS curls x 10  Prone hip extension x 10  Supine hip ADD ball squeeze 2x10 with TrA activation Supine hip extension green TB x 10 R/L Standing hip abduction 2 x 10 R/L Standing extension 2 x 10 R/L  04/03/22 THERAPEUTIC EXERCISE: to improve flexibility, strength and mobility.  Verbal and tactile cues throughout for technique.  Rec Bike L2 x 6 min Seated fwd flexion rollout with green pball 10x -cues for 3 sec hold in fwd flexed position Seated iso hip add ball squeeze 2x10 with 5 sec hold Seated iso hip add ball squeeze with LAQ 1x10 Supine TrA brace + alt march with RTB 10x with 3 sec hold- cues to for TrA contraction Supine TrA brace +alt march with BLE hold in between with RTB 10x- cues for sequencing for LE and breathing during activity Supine SLR R & L x10- cues for TrA contraction Standing ext at wall 3x15 STS with table elevated to 45 hip flexion  2x10- cues to use hands on table instead of anterior thighs   PATIENT EDUCATION:  Education details: HEP progression - standing hip abd, ext, flex, heel raise/toe raise Person educated: Patient Education method: Explanation, Demonstration, Verbal cues, and Handouts Education comprehension: verbalized understanding and returned demonstration  HOME EXERCISE PROGRAM: Access Code: [email or web address] URL: [email or web address] Date: 04/10/2022 Prepared by: Annie Paras  Exercises - Hooklying Hamstring Stretch with Strap  - 2-3 x daily - 7 x weekly - 3 reps - 30 sec hold -  Standing Back Extension at Wall  - 2-3 x daily - 7 x weekly - 2 sets - 15 reps - 3 sec hold - Tourist information centre manager with Chair and Counter Support  - 2-3 x daily - 7 x weekly - 3 reps - 30 sec hold - Supine Posterior Pelvic Tilt  - 1 x daily - 7 x weekly - 3 sets - 10 reps - Hooklying Clamshell with Resistance  - 1 x daily - 7 x weekly - 2 sets - 10 reps - Supine March with Resistance  Band  - 1 x daily - 7 x weekly - 3 sets - 10 reps - Seated Hip Adduction Isometrics with Ball  - 1 x daily - 7 x weekly - 2 sets - 10 reps - 5 hold - Supine March with Alternating Leg Lifts  - 1 x daily - 7 x weekly - 3 sets - 10 reps - Supine Pelvic Tilt with Straight Leg Raise  - 1 x daily - 7 x weekly - 2 sets - 10 reps - Heel Toe Raises with Counter Support  - 1 x daily - 3-4 x weekly - 2 sets - 10 reps - 3 sec hold - Standing Hip Flexion with Resistance Loop  - 1 x daily - 3-4 x weekly - 2 sets - 10 reps - 3 sec hold - Hip Abduction with Resistance Loop  - 1 x daily - 3-4 x weekly - 2 sets - 10 reps - 3 sec hold - Hip Extension with Resistance Loop  - 1 x daily - 3-4 x weekly - 2 sets - 10 reps - 3 sec hold   ASSESSMENT:  CLINICAL IMPRESSION: Pt completed all interventions. More emphasis was put on functional strengthening. Pt became fatigued after each exercise, requiring seated and standing breaks. Had an instance of LBP with STS exercise but provided instruction for fwd WS which eased the pain. Finished with STM to R quads and gastroc to reduce muscle tension and soreness.   OBJECTIVE IMPAIRMENTS:  Abnormal gait, decreased activity tolerance, decreased balance, decreased endurance, decreased mobility, difficulty walking, decreased ROM, decreased strength, hypomobility, impaired perceived functional ability, impaired flexibility, improper body mechanics, and pain.   ACTIVITY LIMITATIONS: carrying, lifting, bending, sitting, standing, squatting, stairs, bed mobility, continence, bathing, toileting, dressing, and  locomotion level  PARTICIPATION LIMITATIONS: cleaning, laundry, shopping, community activity, occupation, and attending festivals and ball games with grandchildren  PERSONAL FACTORS: Past/current experiences, Time since onset of injury/illness/exacerbation, and 3+ comorbidities: Diabetes, Osteoarthritis, Anxiety, GERD, R shoulder replacement (10/2015), and L TKA are also affecting patient's functional outcome.   REHAB POTENTIAL: Good  CLINICAL DECISION MAKING: Evolving/moderate complexity  EVALUATION COMPLEXITY: Moderate   GOALS: Goals reviewed with patient? Yes  SHORT TERM GOALS: Target date: 04/24/2022  Pt will perform HEP independently to demonstrate understanding of activity. Baseline: Goal status: MET 04/10/22  2.  Pt will have a 25% reduction in pain during mobility to improve everyday activity. Baseline:  Goal status: MET 04/10/22, Pt reports 100% improvement.  3.  Pt will have a 25% increase in lumbar mobility to perform ADLs such as toileting and bathing. Baseline:  Goal status: IN PROGRESS  4.  Pt will improve strength of proximal hip muscles to >/= 3/5 to increase ability to stand from a chair and walk.  Baseline:  Goal status: IN PROGRESS    LONG TERM GOALS: Target date: 05/22/22  Pt will perform HEP independently to demonstrate understanding of activity. Baseline:  Goal status: IN PROGRESS  2.  Pt will have 75% reduction in pain during mobility to improve everyday activity. Baseline:  Goal status: MET 04/10/22 Pt reports 100% improvement in pain.   3. Pt will have a 75% increase in lumbar mobility to perform ADLs such as toileting and bathing. Baseline:  Goal status: IN PROGRESS  4.  Pt will improve strength of proximal hip muscles to >/=4-/5 to increase ability to stand from a chair and walk.  Baseline:  Goal status: IN PROGRESS  5.  Pt will improve modified Oswestry to 26%  to show increase in functional activity. Baseline:  19 / 50 = 38.0 % Goal  status: IN PROGRESS  6.  Pt will perform STS with modified independence using single point cane to demonstrate independence and ability to stand from chair.  Baseline:  Goal status: IN PROGRESS   PLAN:  PT FREQUENCY: 2x/week  PT DURATION: 8 weeks  PLANNED INTERVENTIONS: Therapeutic exercises, Therapeutic activity, Neuromuscular re-education, Balance training, Gait training, Patient/Family education, Self Care, Joint mobilization, Joint manipulation, Stair training, Dry Needling, Electrical stimulation, Spinal manipulation, Spinal mobilization, Cryotherapy, Moist heat, Manual therapy, and Re-evaluation.  PLAN FOR NEXT SESSION: review HEP, progress lumbopelvic mobility and core strengthening, progress proximal hip muscle strengthening, include rotation mobility for ease of self cleaning, gait, balance  Clarene Essex, PTA 04/12/2022

## 2022-04-16 NOTE — Therapy (Addendum)
OUTPATIENT PHYSICAL THERAPY TREATMENT   Patient Name: [name] MRN: [id number] DOB:1956/04/16, 66 y.o., female Today's Date: 03/24/2022   PT End of Session - 04/17/22 0927     Visit Number 7    Date for PT Re-Evaluation 05/22/22    Authorization Type [employer] Medicare    PT Start Time 0927    PT Stop Time 4650    PT Time Calculation (min) 47 min    Activity Tolerance Patient tolerated treatment well    Behavior During Therapy [facility] for tasks assessed/performed                  Past Medical History:  Diagnosis Date   Allergic rhinitis    Allergy    Anemia 07/17/2016   Anxiety    Asthma    seasonal   Blood transfusion without reported diagnosis    Breast cancer ([place])    right   Bronchitis    Colon polyps    CPAP (continuous positive airway pressure) dependence    Depression    Diabetes mellitus    borderline but takes metformin   Edema    Family history of breast cancer in first degree relative    Fibromyalgia    GERD (gastroesophageal reflux disease)    Hyperglycemia 03/05/2017   Hyperlipidemia 08/21/2016   no meds   Hyperparathyroidism    Hypertension    controlled by medications   Joint pain    Neuromuscular disorder ([place])    Fibromyalgia   Obesity    Osteoarthritis    RA   PONV (postoperative nausea and vomiting)    occasional   Pre-diabetes    Sleep apnea    wears cpap   Viral meningitis    Vitamin D deficiency     Past Surgical History:  Procedure Laterality Date   [place] SURGERY     09/21/21   BREAST EXCISIONAL BIOPSY Left 1993   benign   BREAST SURGERY     Tran flap due to breast cancer   [place]   COLONOSCOPY  08/14/2011   HAND SURGERY     dog bite, right hand   HAND SURGERY     trauma, left hand   KNEE ARTHROSCOPY     bilateral   left knee replacement  2010   LUMBAR WOUND DEBRIDEMENT N/A 10/07/2021   Procedure: LUMBAR WOUND DEBRIDEMENT/REVISION;  Surgeon:  Ashok Pall, MD;  Location: [place];  Service: Neurosurgery;  Laterality: N/A;  3C/RM 21 to follow Dr Kathyrn Sheriff   MASTECTOMY  01/13/1994   Right breast   parathyroid resection     PARATHYROIDECTOMY     POLYPECTOMY     rectal abscess     SEPTOPLASTY  1980   SHOULDER ARTHROSCOPY Right 11/09/2015   Procedure: ARTHROSCOPY SHOULDER-acromioplasty, distal clavicle resection and debridement;  Surgeon: Melrose Nakayama, MD;  Location: [place];  Service: Orthopedics;  Laterality: Right;   shoulder arthroscopy     rotator cuff repair   TOE SURGERY Right    paronychia and adenoma removed   TONSILLECTOMY     TOTAL KNEE ARTHROPLASTY  06/18/2008   [place]   TUMOR REMOVAL  1982   , scalp    Patient Active Problem List   Diagnosis Date Noted   Elevated sed rate 04/03/2022   Viral bronchitis 03/22/2022   Low back pain 01/11/2022   Drainage from wound 10/07/2021   Spondylolisthesis of lumbar region 09/21/2021  DDD (degenerative disc disease), cervical 05/04/2021   Neck pain 05/02/2021   Left arm pain 01/21/2021   Memory changes 04/19/2020   Educated about COVID-19 virus infection 04/19/2020   Peripheral neuropathy 04/19/2020   TMJ arthralgia 04/19/2020   Urinary incontinence 10/27/2019   Other peripheral vertigo, unspecified ear 10/27/2019   Vertigo 03/14/2018   RLS (restless legs syndrome) 06/07/2017   Right knee pain 12/06/2016   Arthralgia 12/03/2016   Hyperlipidemia 08/21/2016   Anemia 07/17/2016   Genetic testing 05/15/2016   Allergic urticaria 03/27/2016   Dermographia 03/27/2016   History of breast cancer 03/27/2016   Rheumatoid arthritis ([place]) 06/24/2015   Diverticulitis of colon without hemorrhage 09/21/2014   Asthma with acute exacerbation 07/14/2014   Cough 07/06/2014   Gout of big toe 03/10/2013   Preventative health care 10/08/2012   OSA (obstructive sleep apnea) 04/23/2012   Diabetes mellitus, type 2 ([place]) 12/10/2011   Diarrhea 09/04/2011   Personal history of colonic  polyps - adenoma 08/14/2011   Fatigue 08/03/2011   Ear canal dryness 08/03/2011   Palpitations 09/21/2010   Morbid obesity ([place]) 09/21/2010   PAC (premature atrial contraction) 09/21/2010   ATTENTION DEFICIT DISORDER, INATTENTIVE TYPE 09/22/2009   Acute sinusitis 09/22/2009   VIRAL MENINGITIS, HX OF 04/29/2009   Insomnia 02/15/2009   Vitamin D deficiency 03/11/2008   Fibromyalgia 03/05/2008   HYPERPARATHYROIDISM, HX OF 08/06/2007   Allergic rhinitis 07/05/2007   Osteoarthritis 02/28/2007   Edema 11/01/2006   Depression with anxiety 06/25/2006   Essential hypertension 06/25/2006   Gastroesophageal reflux disease without esophagitis 06/25/2006     PCP: Mosie Lukes, MD  REFERRING PROVIDER: Mosie Lukes, MD  REFERRING DIAG: M54.41 (ICD-10-CM) - Right-sided low back pain with right-sided sciatica, unspecified chronicity  Rationale for Evaluation and Treatment: Rehabilitation  THERAPY DIAG:  Other low back pain  Muscle weakness (generalized)  Other abnormalities of gait and mobility   ONSET DATE: 09/21/21 - LUMBAR THREE-FOUR, LUMBAR FOUR-FIVE POSTERIOR LUMBAR INTERBODY FUSION   SUBJECTIVE:                                                                                                                                                                                           SUBJECTIVE STATEMENT: Just stiff mainly. I've been practicing my core at home. I realized this weekend that I can't come down a curb.   PERTINENT HISTORY:  Diabetes, Osteoarthritis, Anxiety, GERD, R shoulder replacement (10/2015), L TKA, Back surgery (4/23)  PAIN:  Are you having pain? Yes: NPRS scale: 0/10 Pain location: buttocks Pain description: discomfort Aggravating factors: moving in bed, bending forward, toilet hygiene  Relieving factors:  ibuprofen, Tylenol, ice  PRECAUTIONS: None  WEIGHT BEARING RESTRICTIONS: No  FALLS:  Has patient fallen in last 6 months? No  LIVING  ENVIRONMENT: Lives with: lives alone Lives in: House/apartment Stairs: No Has following equipment at home: Single point cane and elevated commode with bidet   OCCUPATION: PT - accounting, mostly deskwork  PLOF: Independent, Needs assistance with ADLs (difficulty with toilet hygiene), and Leisure: time with family.  PATIENT GOALS: "To be able to walk and be active again. To be able to clean myself after using the toilet. To be able to bend over an pick things up."   OBJECTIVE:   DIAGNOSTIC FINDINGS:  MRI Lumbar Spine w/o Contrast: 01/07/22  IMPRESSION: 1. Postsurgical changes from L3-L5 PLIF. Bone marrow signal changes within the spinous processes and bilateral lamina of L2, L3, and L4 raise the suspicion for osteomyelitis. 2. Previously described T2 hyperintense material along the right lateral and dorsal epidural space at the L2 and L2-3 levels is not seen on the current study. No IV contrast was administered on the current exam. No new epidural space abnormality is identified. 3. Similar degree of canal stenosis at L2-3.  CT Lumbar Spine w/o Contrast: 01/07/22 IMPRESSION: 1. No CT evidence for acute abnormality within the lumbar spine. 2. Postoperative changes from prior PLIF at L3-4 and L4-5. Mild subsidence at L3-4. No other hardware complication. 3. Underlying degenerative spondylosis and facet arthrosis, better appreciated and described on recent MRIs.  PATIENT SURVEYS:  Modified Oswestry:  19 / 50 = 38.0 %  FOTO: NT  SCREENING FOR RED FLAGS: Bowel or bladder incontinence: Yes: bladder (urge incontinence) Spinal tumors: No Cauda equina syndrome: No Compression fracture: No Abdominal aneurysm: No  COGNITION: Overall cognitive status: Within functional limits for tasks assessed     SENSATION: WFL Intermittent brief R LE radicular pain  MUSCLE LENGTH: Hamstrings: mild tight B Quads: mod tight B  POSTURE: No Significant postural  limitations  PALPATION: Lumbar paraspinals & glutes - muscle tension WNL w/o TTP  LUMBAR ROM:   AROM eval  Flexion <25%  Extension 50%  Right lateral flexion 25%  Left lateral flexion 25%  Right rotation WNL  Left rotation WNL   (Blank rows = not tested)  LOWER EXTREMITY ROM:     Grossly WFL but accessory trunk lean in standing   LOWER EXTREMITY MMT:    MMT Right eval Left eval  Hip flexion 2+ 3+  Hip extension 2+ 2+  Hip abduction 4- 4  Hip adduction 2+ 3+  Hip internal rotation 4+ 4+  Hip external rotation 4+ 4+  Knee flexion 5 5  Knee extension 5 5  Ankle dorsiflexion 5 4  Ankle plantarflexion    Ankle inversion    Ankle eversion     (Blank rows = not tested)  LUMBAR SPECIAL TESTS:  Straight leg raise test: Negative   GAIT: Distance walked: 60 feet, back and forth from exam room Assistive device utilized: Single point cane Level of assistance: Modified independence  DATE:  04/17/2022 THERAPEUTIC EXERCISE: to improve flexibility, strength and mobility.  Verbal and tactile cues throughout for technique. Rec Bike L2 x 6 min Step downs/step ups 4 inch and 6 inch x 10 ea with intermittent UE support to no UE support  Step Ups 6 inch x 20 with L focusing on slight forward trunk lean to normalize form and decrease LOB bwd. Hip flexor stretch is sitting x 10 sec ea for review Standing lumbar ext at wall for hip flexor stretch 2 x 5 breaths Attempted Good Mornings in seated position x 5 but pt felt some pain in low back (possibly try in Mini squat position next visit) Mini Squat with bil OH reach 5 sec hold then shoulder ext x 5 sec; 1 set of 1, 2 sets of 2 Weight shifting fwd/bwd in front of mat table to get patient used to changes in COG  SELF CARE: Visualization of stepping down/up curb to help with confidence in performing activity    04/12/22 THERAPEUTIC  EXERCISE: to improve flexibility, strength and mobility.  Verbal and tactile cues throughout for technique. Rec Bike L2 x 6 min Step ups fwd 2x5  BLE - 1 sit break - UE support Standing high knees 2x5 - UE support STS x 12 reps from elevated seat ( 2 airex pads) Supine heel slides x 10 BLE  Manual Therapy: STM to R quads and gastroc Passive stretch R quads and gastroc  04/10/22 THERAPEUTIC EXERCISE: to improve flexibility, strength and mobility.  Verbal and tactile cues throughout for technique. Rec Bike L2 x 6 min Supine Glute Bridges x10- cues for 5 sec hold  Hooklying PPT with heel walking out and in x10- cues for TrA contraction Seated hip ADD iso ball squeeze 10x5 sec hold Seated hip ADD iso ball squeeze + LAQ x10  Standing hip ABD with RTB at ankles x10 R/L Standing hip ext with RTB at ankles x10 R/L Standing hip flexion with RTB at ankles x10 R/L Heel Raise & Toe Raise 2x10  Mini Squat using chair for balance x10- cues to hinge at hip instead of knees GAIT TRAINING: To normalize gait pattern. 300 ft with no AD- cueing for reciprocal arm swing, heel to toe stepping, and looking forward during walking  PATIENT EDUCATION:  Education details: HEP progression - functional mini squat with bil OH shoulder fleixon then extension for lumbar strength and visualization see self care. Person educated: Patient Education method: Explanation, Demonstration, Verbal cues, and Handouts Education comprehension: verbalized understanding and returned demonstration  HOME EXERCISE PROGRAM: Access Code: [email or web address] URL: [email or web address] Date: 04/17/2022 Prepared by: Almyra Free  Exercises - Hooklying Hamstring Stretch with Strap  - 2-3 x daily - 7 x weekly - 3 reps - 30 sec hold - Standing Back Extension at [place]  - 2-3 x daily - 7 x weekly - 2 sets - 15 reps - 3 sec hold - Tourist information centre manager with Chair and Counter Support  - 2-3 x daily - 7 x weekly - 3 reps - 30 sec hold - Supine  Posterior Pelvic Tilt  - 1 x daily - 7 x weekly - 3 sets - 10 reps - Hooklying Clamshell with Resistance  - 1 x daily - 7 x weekly - 2 sets - 10 reps - Supine March with Resistance Band  - 1 x daily - 7 x weekly - 3 sets - 10 reps - Seated Hip Adduction Isometrics with Ball  - 1 x daily - 7 x weekly - 2 sets -  10 reps - 5 hold - Supine March with Alternating Leg Lifts  - 1 x daily - 7 x weekly - 3 sets - 10 reps - Supine Pelvic Tilt with Straight Leg Raise  - 1 x daily - 7 x weekly - 2 sets - 10 reps - Heel Toe Raises with Counter Support  - 1 x daily - 3-4 x weekly - 2 sets - 10 reps - 3 sec hold - Standing Hip Flexion with Resistance Loop  - 1 x daily - 3-4 x weekly - 2 sets - 10 reps - 3 sec hold - Hip Abduction with Resistance Loop  - 1 x daily - 3-4 x weekly - 2 sets - 10 reps - 3 sec hold - Hip Extension with Resistance Loop  - 1 x daily - 3-4 x weekly - 2 sets - 10 reps - 3 sec hold - Squat with Medicine [employer]  - 1 x daily - 7 x weekly - 2-3 sets - 5 reps - 5 sec  hold  ASSESSMENT:  CLINICAL IMPRESSION: Elizet reports difficulty with curbs, so we focused on this today as well as lumbar strengthening. She lack's confidence on steps without UE support but responded well to visualization and verbal cues. She demonstrated ease with 4 and 6 inch curbs by the end of session. She continues to need encouragement to move more freely with transitional movements. Good response to lumbar strengthening in functional squat position. Still experiencing pain with toileting. Emonii continues to demonstrate potential for improvement and would benefit from continued skilled therapy to address impairments.     OBJECTIVE IMPAIRMENTS:  Abnormal gait, decreased activity tolerance, decreased balance, decreased endurance, decreased mobility, difficulty walking, decreased ROM, decreased strength, hypomobility, impaired perceived functional ability, impaired flexibility, improper body mechanics, and  pain.   ACTIVITY LIMITATIONS: carrying, lifting, bending, sitting, standing, squatting, stairs, bed mobility, continence, bathing, toileting, dressing, and locomotion level  PARTICIPATION LIMITATIONS: cleaning, laundry, shopping, community activity, occupation, and attending festivals and ball games with grandchildren  PERSONAL FACTORS: Past/current experiences, Time since onset of injury/illness/exacerbation, and 3+ comorbidities: Diabetes, Osteoarthritis, Anxiety, GERD, R shoulder replacement (10/2015), and L TKA are also affecting patient's functional outcome.   REHAB POTENTIAL: Good  CLINICAL DECISION MAKING: Evolving/moderate complexity  EVALUATION COMPLEXITY: Moderate   GOALS: Goals reviewed with patient? Yes  SHORT TERM GOALS: Target date: 04/24/2022  Pt will perform HEP independently to demonstrate understanding of activity. Baseline: Goal status: MET 04/10/22  2.  Pt will have a 25% reduction in pain during mobility to improve everyday activity. Baseline:  Goal status: MET 04/10/22, Pt reports 100% improvement.  3.  Pt will have a 25% increase in lumbar mobility to perform ADLs such as toileting and bathing. Baseline:  Goal status: IN PROGRESS  4.  Pt will improve strength of proximal hip muscles to >/= 3/5 to increase ability to stand from a chair and walk.  Baseline:  Goal status: IN PROGRESS    LONG TERM GOALS: Target date: 05/22/22  Pt will perform HEP independently to demonstrate understanding of activity. Baseline:  Goal status: IN PROGRESS  2.  Pt will have 75% reduction in pain during mobility to improve everyday activity. Baseline:  Goal status: MET 04/10/22 Pt reports 100% improvement in pain.   3. Pt will have a 75% increase in lumbar mobility to perform ADLs such as toileting and bathing. Baseline:  Goal status: IN PROGRESS  4.  Pt will improve strength of proximal hip muscles to >/=4-/5  to increase ability to stand from a chair and walk.   Baseline:  Goal status: IN PROGRESS  5.  Pt will improve modified Oswestry to 26% to show increase in functional activity. Baseline:  19 / 50 = 38.0 % Goal status: IN PROGRESS  6.  Pt will perform STS with modified independence using single point cane to demonstrate independence and ability to stand from chair.  Baseline:  Goal status: IN PROGRESS   PLAN:  PT FREQUENCY: 2x/week  PT DURATION: 8 weeks  PLANNED INTERVENTIONS: Therapeutic exercises, Therapeutic activity, Neuromuscular re-education, Balance training, Gait training, Patient/Family education, Self Care, Joint mobilization, Joint manipulation, Stair training, Dry Needling, Electrical stimulation, Spinal manipulation, Spinal mobilization, Cryotherapy, Moist heat, Manual therapy, and Re-evaluation.  PLAN FOR NEXT SESSION: review latest addition to HEP, progress lumbopelvic mobility and core strengthening, progress proximal hip muscle strengthening, include rotation mobility for ease of self cleaning, gait, balance  Rianne Degraaf, PT 04/17/2022  10:20 AM

## 2022-04-17 ENCOUNTER — Encounter: Payer: Self-pay | Admitting: Physical Therapy

## 2022-04-17 ENCOUNTER — Ambulatory Visit: Payer: Medicare Other | Admitting: Physical Therapy

## 2022-04-17 DIAGNOSIS — M6281 Muscle weakness (generalized): Secondary | ICD-10-CM | POA: Diagnosis not present

## 2022-04-17 DIAGNOSIS — R2689 Other abnormalities of gait and mobility: Secondary | ICD-10-CM | POA: Diagnosis not present

## 2022-04-17 DIAGNOSIS — M5459 Other low back pain: Secondary | ICD-10-CM

## 2022-04-26 ENCOUNTER — Ambulatory Visit: Payer: Medicare Other

## 2022-04-26 DIAGNOSIS — R2689 Other abnormalities of gait and mobility: Secondary | ICD-10-CM | POA: Diagnosis not present

## 2022-04-26 DIAGNOSIS — M5459 Other low back pain: Secondary | ICD-10-CM

## 2022-04-26 DIAGNOSIS — M6281 Muscle weakness (generalized): Secondary | ICD-10-CM

## 2022-04-26 NOTE — Therapy (Addendum)
OUTPATIENT PHYSICAL THERAPY TREATMENT   Patient Name: [name] MRN: [id number] DOB:Sep 26, 1955, 66 y.o., female Today's Date: 04/26/22   PT End of Session - 04/26/22 0846     Visit Number 8    Date for PT Re-Evaluation 05/22/22    Authorization Type [employer] Medicare    PT Start Time 0845    PT Stop Time 0930    PT Time Calculation (min) 45 min    Activity Tolerance Patient tolerated treatment well    Behavior During Therapy [facility] for tasks assessed/performed                   Past Medical History:  Diagnosis Date   Allergic rhinitis    Allergy    Anemia 07/17/2016   Anxiety    Asthma    seasonal   Blood transfusion without reported diagnosis    Breast cancer ([place])    right   Bronchitis    Colon polyps    CPAP (continuous positive airway pressure) dependence    Depression    Diabetes mellitus    borderline but takes metformin   Edema    Family history of breast cancer in first degree relative    Fibromyalgia    GERD (gastroesophageal reflux disease)    Hyperglycemia 03/05/2017   Hyperlipidemia 08/21/2016   no meds   Hyperparathyroidism    Hypertension    controlled by medications   Joint pain    Neuromuscular disorder ([place])    Fibromyalgia   Obesity    Osteoarthritis    RA   PONV (postoperative nausea and vomiting)    occasional   Pre-diabetes    Sleep apnea    wears cpap   Viral meningitis    Vitamin D deficiency     Past Surgical History:  Procedure Laterality Date   ABDOMINAL HYSTERECTOMY  1998   ADENOIDECTOMY     BACK SURGERY     09/21/21   BREAST EXCISIONAL BIOPSY Left 1993   benign   BREAST SURGERY     Tran flap due to breast cancer   CESAREAN SECTION  1988   COLONOSCOPY  08/14/2011   HAND SURGERY     dog bite, right hand   HAND SURGERY     trauma, left hand   KNEE ARTHROSCOPY     bilateral   left knee replacement  2010   LUMBAR WOUND DEBRIDEMENT N/A 10/07/2021   Procedure: LUMBAR WOUND DEBRIDEMENT/REVISION;  Surgeon:  Ashok Pall, MD;  Location: [place];  Service: Neurosurgery;  Laterality: N/A;  3C/RM 21 to follow Dr Kathyrn Sheriff   MASTECTOMY  01/13/1994   Right breast   parathyroid resection     PARATHYROIDECTOMY     POLYPECTOMY     rectal abscess     SEPTOPLASTY  1980   SHOULDER ARTHROSCOPY Right 11/09/2015   Procedure: ARTHROSCOPY SHOULDER-acromioplasty, distal clavicle resection and debridement;  Surgeon: Melrose Nakayama, MD;  Location: [place];  Service: Orthopedics;  Laterality: Right;   shoulder arthroscopy     rotator cuff repair   TOE SURGERY Right    paronychia and adenoma removed   TONSILLECTOMY     TOTAL KNEE ARTHROPLASTY  06/18/2008   [place]   TUMOR REMOVAL  1982   , scalp    Patient Active Problem List   Diagnosis Date Noted   Elevated sed rate 04/03/2022   Viral bronchitis 03/22/2022   Low back pain 01/11/2022   Drainage from wound 10/07/2021   Spondylolisthesis of lumbar region 09/21/2021  DDD (degenerative disc disease), cervical 05/04/2021   Neck pain 05/02/2021   Left arm pain 01/21/2021   Memory changes 04/19/2020   Educated about COVID-19 virus infection 04/19/2020   Peripheral neuropathy 04/19/2020   TMJ arthralgia 04/19/2020   Urinary incontinence 10/27/2019   Other peripheral vertigo, unspecified ear 10/27/2019   Vertigo 03/14/2018   RLS (restless legs syndrome) 06/07/2017   Right knee pain 12/06/2016   Arthralgia 12/03/2016   Hyperlipidemia 08/21/2016   Anemia 07/17/2016   Genetic testing 05/15/2016   Allergic urticaria 03/27/2016   Dermographia 03/27/2016   History of breast cancer 03/27/2016   Rheumatoid arthritis ([place]) 06/24/2015   Diverticulitis of colon without hemorrhage 09/21/2014   Asthma with acute exacerbation 07/14/2014   Cough 07/06/2014   Gout of big toe 03/10/2013   Preventative health care 10/08/2012   OSA (obstructive sleep apnea) 04/23/2012   Diabetes mellitus, type 2 ([place]) 12/10/2011   Diarrhea 09/04/2011   Personal history of colonic  polyps - adenoma 08/14/2011   Fatigue 08/03/2011   Ear canal dryness 08/03/2011   Palpitations 09/21/2010   Morbid obesity ([place]) 09/21/2010   PAC (premature atrial contraction) 09/21/2010   ATTENTION DEFICIT DISORDER, INATTENTIVE TYPE 09/22/2009   Acute sinusitis 09/22/2009   VIRAL MENINGITIS, HX OF 04/29/2009   Insomnia 02/15/2009   Vitamin D deficiency 03/11/2008   Fibromyalgia 03/05/2008   HYPERPARATHYROIDISM, HX OF 08/06/2007   Allergic rhinitis 07/05/2007   Osteoarthritis 02/28/2007   Edema 11/01/2006   Depression with anxiety 06/25/2006   Essential hypertension 06/25/2006   Gastroesophageal reflux disease without esophagitis 06/25/2006     PCP: Mosie Lukes, MD  REFERRING PROVIDER: Mosie Lukes, MD  REFERRING DIAG: M54.41 (ICD-10-CM) - Right-sided low back pain with right-sided sciatica, unspecified chronicity  Rationale for Evaluation and Treatment: Rehabilitation  THERAPY DIAG:  Other low back pain  Muscle weakness (generalized)  Other abnormalities of gait and mobility   ONSET DATE: 09/21/21 - LUMBAR THREE-FOUR, LUMBAR FOUR-FIVE POSTERIOR LUMBAR INTERBODY FUSION   SUBJECTIVE:                                                                                                                                                                                           SUBJECTIVE STATEMENT: Pt states that she wants to be able to bend over more easily.    PERTINENT HISTORY:  Diabetes, Osteoarthritis, Anxiety, GERD, R shoulder replacement (10/2015), L TKA, Back surgery (4/23)  PAIN:  Are you having pain? Yes: NPRS scale: 0/10 Pain location: buttocks Pain description: discomfort Aggravating factors: moving in bed, bending forward, toilet hygiene  Relieving factors: ibuprofen, Tylenol, ice  PRECAUTIONS: None  WEIGHT BEARING RESTRICTIONS: No  FALLS:  Has patient fallen in last 6 months? No  LIVING ENVIRONMENT: Lives with: lives alone Lives in:  House/apartment Stairs: No Has following equipment at home: Single point cane and elevated commode with bidet   OCCUPATION: PT - accounting, mostly deskwork  PLOF: Independent, Needs assistance with ADLs (difficulty with toilet hygiene), and Leisure: time with family.  PATIENT GOALS: "To be able to walk and be active again. To be able to clean myself after using the toilet. To be able to bend over an pick things up."   OBJECTIVE:   DIAGNOSTIC FINDINGS:  MRI Lumbar Spine w/o Contrast: 01/07/22  IMPRESSION: 1. Postsurgical changes from L3-L5 PLIF. Bone marrow signal changes within the spinous processes and bilateral lamina of L2, L3, and L4 raise the suspicion for osteomyelitis. 2. Previously described T2 hyperintense material along the right lateral and dorsal epidural space at the L2 and L2-3 levels is not seen on the current study. No IV contrast was administered on the current exam. No new epidural space abnormality is identified. 3. Similar degree of canal stenosis at L2-3.  CT Lumbar Spine w/o Contrast: 01/07/22 IMPRESSION: 1. No CT evidence for acute abnormality within the lumbar spine. 2. Postoperative changes from prior PLIF at L3-4 and L4-5. Mild subsidence at L3-4. No other hardware complication. 3. Underlying degenerative spondylosis and facet arthrosis, better appreciated and described on recent MRIs.  PATIENT SURVEYS:  Modified Oswestry:  19 / 50 = 38.0 %  FOTO: NT  SCREENING FOR RED FLAGS: Bowel or bladder incontinence: Yes: bladder (urge incontinence) Spinal tumors: No Cauda equina syndrome: No Compression fracture: No Abdominal aneurysm: No  COGNITION: Overall cognitive status: Within functional limits for tasks assessed     SENSATION: WFL Intermittent brief R LE radicular pain  MUSCLE LENGTH: Hamstrings: mild tight B Quads: mod tight B  POSTURE: No Significant postural limitations  PALPATION: Lumbar paraspinals & glutes - muscle tension WNL w/o  TTP  LUMBAR ROM:   AROM eval 04/26/22  Flexion <25% WNL- just weak coming back up  Extension 50% 25%  Right lateral flexion 25% WNL  Left lateral flexion 25% WNL  Right rotation WNL   Left rotation WNL    (Blank rows = not tested)  LOWER EXTREMITY ROM:     Grossly WFL but accessory trunk lean in standing   LOWER EXTREMITY MMT:    MMT Right eval Left eval Right 04/26/22 Left 04/26/22  Hip flexion 2+ 3+ 4- 4-  Hip extension 2+ 2+ 4 4  Hip abduction 4- _0 Hip adduction 2+ 3+ 4 4+  Hip internal rotation 4+ 4+    Hip external rotation 4+ 4+    Knee flexion 5 5    Knee extension 5 5    Ankle dorsiflexion 5 4    Ankle plantarflexion      Ankle inversion      Ankle eversion       (Blank rows = not tested)  LUMBAR SPECIAL TESTS:  Straight leg raise test: Negative   GAIT: Distance walked: 60 feet, back and forth from exam room Assistive device utilized: Single point cane Level of assistance: Modified independence Comments: increased lumbar extension  TODAY'S TREATMENT:  DATE:  04/12/22 THERAPEUTIC EXERCISE: to improve flexibility, strength and mobility.  Verbal and tactile cues throughout for technique. Rec Bike L2 x 6 min LE MMT and lumbar ROM assessment Seated deadlift with blue weight ball x 10 Standing RDL x 5 with 1 arm support Quadruped arm raises x 5 bil Seated lumbar extension blue TB x 10  04/12/22 THERAPEUTIC EXERCISE: to improve flexibility, strength and mobility.  Verbal and tactile cues throughout for technique. Rec Bike L2 x 6 min Step downs/step ups 4 inch and 6 inch x 10 ea with intermittent UE support to no UE support  Step Ups 6 inch x 20 with L focusing on slight forward trunk lean to normalize form and decrease LOB bwd. Hip flexor stretch is sitting x 10 sec ea for review Standing lumbar ext at wall for hip flexor  stretch 2 x 5 breaths Attempted Good Mornings in seated position x 5 but pt felt some pain in low back (possibly try in Mini squat position next visit) Mini Squat with bil OH reach 5 sec hold then shoulder ext x 5 sec; 1 set of 1, 2 sets of 2 Weight shifting fwd/bwd in front of mat table to get patient used to changes in COG  SELF CARE: Visualization of stepping down/up curb to help with confidence in performing activity    04/12/22 THERAPEUTIC EXERCISE: to improve flexibility, strength and mobility.  Verbal and tactile cues throughout for technique. Rec Bike L2 x 6 min Step ups fwd 2x5  BLE - 1 sit break - UE support Standing high knees 2x5 - UE support STS x 12 reps from elevated seat ( 2 airex pads) Supine heel slides x 10 BLE  Manual Therapy: STM to R quads and gastroc Passive stretch R quads and gastroc  04/10/22 THERAPEUTIC EXERCISE: to improve flexibility, strength and mobility.  Verbal and tactile cues throughout for technique. Rec Bike L2 x 6 min Supine Glute Bridges x10- cues for 5 sec hold  Hooklying PPT with heel walking out and in x10- cues for TrA contraction Seated hip ADD iso ball squeeze 10x5 sec hold Seated hip ADD iso ball squeeze + LAQ x10  Standing hip ABD with RTB at ankles x10 R/L Standing hip ext with RTB at ankles x10 R/L Standing hip flexion with RTB at ankles x10 R/L Heel Raise & Toe Raise 2x10  Mini Squat using chair for balance x10- cues to hinge at hip instead of knees GAIT TRAINING: To normalize gait pattern. 300 ft with no AD- cueing for reciprocal arm swing, heel to toe stepping, and looking forward during walking  PATIENT EDUCATION:  Education details: HEP progression - functional mini squat with bil OH shoulder fleixon then extension for lumbar strength and visualization see self care. Person educated: Patient Education method: Explanation, Demonstration, Verbal cues, and Handouts Education comprehension: verbalized understanding and returned  demonstration  HOME EXERCISE PROGRAM: Access Code: [email or web address] URL: [email or web address] Date: 04/17/2022 Prepared by: Almyra Free  Exercises - Hooklying Hamstring Stretch with Strap  - 2-3 x daily - 7 x weekly - 3 reps - 30 sec hold - Standing Back Extension at Wall  - 2-3 x daily - 7 x weekly - 2 sets - 15 reps - 3 sec hold - Quadricep Stretch with Chair and Counter Support  - 2-3 x daily - 7 x weekly - 3 reps - 30 sec hold - Supine Posterior Pelvic Tilt  - 1 x daily - 7 x weekly - 3 sets - 10 reps -  Hooklying Clamshell with Resistance  - 1 x daily - 7 x weekly - 2 sets - 10 reps - Supine March with Resistance Band  - 1 x daily - 7 x weekly - 3 sets - 10 reps - Seated Hip Adduction Isometrics with Ball  - 1 x daily - 7 x weekly - 2 sets - 10 reps - 5 hold - Supine March with Alternating Leg Lifts  - 1 x daily - 7 x weekly - 3 sets - 10 reps - Supine Pelvic Tilt with Straight Leg Raise  - 1 x daily - 7 x weekly - 2 sets - 10 reps - Heel Toe Raises with Counter Support  - 1 x daily - 3-4 x weekly - 2 sets - 10 reps - 3 sec hold - Standing Hip Flexion with Resistance Loop  - 1 x daily - 3-4 x weekly - 2 sets - 10 reps - 3 sec hold - Hip Abduction with Resistance Loop  - 1 x daily - 3-4 x weekly - 2 sets - 10 reps - 3 sec hold - Hip Extension with Resistance Loop  - 1 x daily - 3-4 x weekly - 2 sets - 10 reps - 3 sec hold - Squat with [employer]  - 1 x daily - 7 x weekly - 2-3 sets - 5 reps - 5 sec  hold  ASSESSMENT:  CLINICAL IMPRESSION: Casha continues to note difficulty with bending over to pick items up from the floor. Focused skilled interventions on strengthening lumbar extensor muscles, while providing instruction for safe movements to reduce risk of injury. Progress is being shown toward lumbar ROM and LE strength, meeting both STG #3 and #4. She had a lot of trouble getting in quadruped d/t both knees and just general weakness and was only able to do one  exercise in this position. She required rest in between each exercises but showed good effort. [place] continues to demonstrate potential for improvement and would benefit from continued skilled therapy to address impairments.     OBJECTIVE IMPAIRMENTS:  Abnormal gait, decreased activity tolerance, decreased balance, decreased endurance, decreased mobility, difficulty walking, decreased ROM, decreased strength, hypomobility, impaired perceived functional ability, impaired flexibility, improper body mechanics, and pain.   ACTIVITY LIMITATIONS: carrying, lifting, bending, sitting, standing, squatting, stairs, bed mobility, continence, bathing, toileting, dressing, and locomotion level  PARTICIPATION LIMITATIONS: cleaning, laundry, shopping, community activity, occupation, and attending festivals and ball games with grandchildren  PERSONAL FACTORS: Past/current experiences, Time since onset of injury/illness/exacerbation, and 3+ comorbidities: Diabetes, Osteoarthritis, Anxiety, GERD, R shoulder replacement (10/2015), and L TKA are also affecting patient's functional outcome.   REHAB POTENTIAL: Good  CLINICAL DECISION MAKING: Evolving/moderate complexity  EVALUATION COMPLEXITY: Moderate   GOALS: Goals reviewed with patient? Yes  SHORT TERM GOALS: Target date: 04/24/2022  Pt will perform HEP independently to demonstrate understanding of activity. Baseline: Goal status: MET 04/10/22  2.  Pt will have a 25% reduction in pain during mobility to improve everyday activity. Baseline:  Goal status: MET 04/10/22, Pt reports 100% improvement.  3.  Pt will have a 25% increase in lumbar mobility to perform ADLs such as toileting and bathing. Baseline:  Goal status: MET- 04/26/22  4.  Pt will improve strength of proximal hip muscles to >/= 3/5 to increase ability to stand from a chair and walk.  Baseline:  Goal status: MET 04/26/22    LONG TERM GOALS: Target date: 05/22/22  Pt will perform  HEP independently to  demonstrate understanding of activity. Baseline:  Goal status: IN PROGRESS  2.  Pt will have 75% reduction in pain during mobility to improve everyday activity. Baseline:  Goal status: MET 04/10/22 Pt reports 100% improvement in pain.   3. Pt will have a 75% increase in lumbar mobility to perform ADLs such as toileting and bathing. Baseline:  Goal status: IN PROGRESS  4.  Pt will improve strength of proximal hip muscles to >/=4-/5 to increase ability to stand from a chair and walk.  Baseline:  Goal status: IN PROGRESS  5.  Pt will improve modified Oswestry to 26% to show increase in functional activity. Baseline:  19 / 50 = 38.0 % Goal status: IN PROGRESS  6.  Pt will perform STS with modified independence using single point cane to demonstrate independence and ability to stand from chair.  Baseline:  Goal status: IN PROGRESS   PLAN:  PT FREQUENCY: 2x/week  PT DURATION: 8 weeks  PLANNED INTERVENTIONS: Therapeutic exercises, Therapeutic activity, Neuromuscular re-education, Balance training, Gait training, Patient/Family education, Self Care, Joint mobilization, Joint manipulation, Stair training, Dry Needling, Electrical stimulation, Spinal manipulation, Spinal mobilization, Cryotherapy, Moist heat, Manual therapy, and Re-evaluation.  PLAN FOR NEXT SESSION: review latest addition to HEP, progress lumbopelvic mobility and core strengthening, progress proximal hip muscle strengthening, include rotation mobility for ease of self cleaning, gait, balance  Clarene Essex, PTA 04/26/22, 10:29 AM

## 2022-05-02 DIAGNOSIS — I10 Essential (primary) hypertension: Secondary | ICD-10-CM | POA: Diagnosis not present

## 2022-05-02 DIAGNOSIS — G4733 Obstructive sleep apnea (adult) (pediatric): Secondary | ICD-10-CM | POA: Diagnosis not present

## 2022-05-03 ENCOUNTER — Ambulatory Visit: Payer: Medicare Other | Attending: Family Medicine

## 2022-05-03 DIAGNOSIS — M5459 Other low back pain: Secondary | ICD-10-CM | POA: Diagnosis not present

## 2022-05-03 DIAGNOSIS — R2689 Other abnormalities of gait and mobility: Secondary | ICD-10-CM | POA: Diagnosis not present

## 2022-05-03 DIAGNOSIS — M6281 Muscle weakness (generalized): Secondary | ICD-10-CM | POA: Diagnosis not present

## 2022-05-03 NOTE — Therapy (Addendum)
OUTPATIENT PHYSICAL THERAPY TREATMENT   Patient Name: [name] MRN: [id number] DOB:January 15, 1956, 66 y.o., female Today's Date: 05/03/22   PT End of Session - 05/03/22 0946     Visit Number 9    Date for PT Re-Evaluation 05/22/22    Authorization Type UHC Medicare    PT Start Time 4536    PT Stop Time 0938    PT Time Calculation (min) 51 min    Activity Tolerance Patient tolerated treatment well    Behavior During Therapy [facility] for tasks assessed/performed                    Past Medical History:  Diagnosis Date   Allergic rhinitis    Allergy    Anemia 07/17/2016   Anxiety    Asthma    seasonal   Blood transfusion without reported diagnosis    Breast cancer ([place])    right   Bronchitis    Colon polyps    CPAP (continuous positive airway pressure) dependence    Depression    Diabetes mellitus    borderline but takes metformin   Edema    Family history of breast cancer in first degree relative    Fibromyalgia    GERD (gastroesophageal reflux disease)    Hyperglycemia 03/05/2017   Hyperlipidemia 08/21/2016   no meds   Hyperparathyroidism    Hypertension    controlled by medications   Joint pain    Neuromuscular disorder ([place])    Fibromyalgia   Obesity    Osteoarthritis    RA   PONV (postoperative nausea and vomiting)    occasional   Pre-diabetes    Sleep apnea    wears cpap   Viral meningitis    Vitamin D deficiency     Past Surgical History:  Procedure Laterality Date   [place] SURGERY     09/21/21   BREAST EXCISIONAL BIOPSY Left 1993   benign   BREAST SURGERY     Tran flap due to breast cancer   CESAREAN SECTION  1988   COLONOSCOPY  08/14/2011   HAND SURGERY     dog bite, right hand   HAND SURGERY     trauma, left hand   KNEE ARTHROSCOPY     bilateral   left knee replacement  2010   LUMBAR WOUND DEBRIDEMENT N/A 10/07/2021   Procedure: LUMBAR WOUND DEBRIDEMENT/REVISION;  Surgeon:  Ashok Pall, MD;  Location: [facility];  Service: Neurosurgery;  Laterality: N/A;  3C/RM 21 to follow Dr Kathyrn Sheriff   MASTECTOMY  01/13/1994   Right breast   parathyroid resection     PARATHYROIDECTOMY     POLYPECTOMY     rectal abscess     SEPTOPLASTY  1980   SHOULDER ARTHROSCOPY Right 11/09/2015   Procedure: ARTHROSCOPY SHOULDER-acromioplasty, distal clavicle resection and debridement;  Surgeon: Melrose Nakayama, MD;  Location: [facility];  Service: Orthopedics;  Laterality: Right;   shoulder arthroscopy     rotator cuff repair   TOE SURGERY Right    paronychia and adenoma removed   TONSILLECTOMY     TOTAL KNEE ARTHROPLASTY  06/18/2008   [place]   TUMOR REMOVAL  1982   , scalp    Patient Active Problem List   Diagnosis Date Noted   Elevated sed rate 04/03/2022   Viral bronchitis 03/22/2022   Low back pain 01/11/2022   Drainage from wound 10/07/2021   Spondylolisthesis of lumbar region  09/21/2021   DDD (degenerative disc disease), cervical 05/04/2021   Neck pain 05/02/2021   Left arm pain 01/21/2021   Memory changes 04/19/2020   Educated about COVID-19 virus infection 04/19/2020   Peripheral neuropathy 04/19/2020   TMJ arthralgia 04/19/2020   Urinary incontinence 10/27/2019   Other peripheral vertigo, unspecified ear 10/27/2019   Vertigo 03/14/2018   RLS (restless legs syndrome) 06/07/2017   Right knee pain 12/06/2016   Arthralgia 12/03/2016   Hyperlipidemia 08/21/2016   Anemia 07/17/2016   Genetic testing 05/15/2016   Allergic urticaria 03/27/2016   Dermographia 03/27/2016   History of breast cancer 03/27/2016   Rheumatoid arthritis ([place]) 06/24/2015   Diverticulitis of colon without hemorrhage 09/21/2014   Asthma with acute exacerbation 07/14/2014   Cough 07/06/2014   Gout of big toe 03/10/2013   Preventative health care 10/08/2012   OSA (obstructive sleep apnea) 04/23/2012   Diabetes mellitus, type 2 ([place]) 12/10/2011   Diarrhea 09/04/2011   Personal history of colonic  polyps - adenoma 08/14/2011   Fatigue 08/03/2011   Ear canal dryness 08/03/2011   Palpitations 09/21/2010   Morbid obesity ([place]) 09/21/2010   PAC (premature atrial contraction) 09/21/2010   ATTENTION DEFICIT DISORDER, INATTENTIVE TYPE 09/22/2009   Acute sinusitis 09/22/2009   VIRAL MENINGITIS, HX OF 04/29/2009   Insomnia 02/15/2009   Vitamin D deficiency 03/11/2008   Fibromyalgia 03/05/2008   HYPERPARATHYROIDISM, HX OF 08/06/2007   Allergic rhinitis 07/05/2007   Osteoarthritis 02/28/2007   Edema 11/01/2006   Depression with anxiety 06/25/2006   Essential hypertension 06/25/2006   Gastroesophageal reflux disease without esophagitis 06/25/2006     PCP: Mosie Lukes, MD  REFERRING PROVIDER: Mosie Lukes, MD  REFERRING DIAG: M54.41 (ICD-10-CM) - Right-sided low back pain with right-sided sciatica, unspecified chronicity  Rationale for Evaluation and Treatment: Rehabilitation  THERAPY DIAG:  Other low back pain  Muscle weakness (generalized)  Other abnormalities of gait and mobility   ONSET DATE: 09/21/21 - LUMBAR THREE-FOUR, LUMBAR FOUR-FIVE POSTERIOR LUMBAR INTERBODY FUSION   SUBJECTIVE:                                                                                                                                                                                           SUBJECTIVE STATEMENT: Pt reports she has been working on bending over, which has gotten easier. Her back is sore today.    PERTINENT HISTORY:  Diabetes, Osteoarthritis, Anxiety, GERD, R shoulder replacement (10/2015), L TKA, Back surgery (4/23)  PAIN:  Are you having pain? Yes: NPRS scale: 0/10 Pain location: buttocks Pain description: discomfort Aggravating factors: moving in bed, bending forward, toilet hygiene  Relieving  factors: ibuprofen, Tylenol, ice  PRECAUTIONS: None  WEIGHT BEARING RESTRICTIONS: No  FALLS:  Has patient fallen in last 6 months? No  LIVING ENVIRONMENT: Lives  with: lives alone Lives in: House/apartment Stairs: No Has following equipment at home: Single point cane and elevated commode with bidet   OCCUPATION: PT - accounting, mostly deskwork  PLOF: Independent, Needs assistance with ADLs (difficulty with toilet hygiene), and Leisure: time with family.  PATIENT GOALS: "To be able to walk and be active again. To be able to clean myself after using the toilet. To be able to bend over an pick things up."   OBJECTIVE:   DIAGNOSTIC FINDINGS:  MRI Lumbar Spine w/o Contrast: 01/07/22  IMPRESSION: 1. Postsurgical changes from L3-L5 PLIF. Bone marrow signal changes within the spinous processes and bilateral lamina of L2, L3, and L4 raise the suspicion for osteomyelitis. 2. Previously described T2 hyperintense material along the right lateral and dorsal epidural space at the L2 and L2-3 levels is not seen on the current study. No IV contrast was administered on the current exam. No new epidural space abnormality is identified. 3. Similar degree of canal stenosis at L2-3.  CT Lumbar Spine w/o Contrast: 01/07/22 IMPRESSION: 1. No CT evidence for acute abnormality within the lumbar spine. 2. Postoperative changes from prior PLIF at L3-4 and L4-5. Mild subsidence at L3-4. No other hardware complication. 3. Underlying degenerative spondylosis and facet arthrosis, better appreciated and described on recent MRIs.  PATIENT SURVEYS:  Modified Oswestry:  19 / 50 = 38.0 %  FOTO: NT  SCREENING FOR RED FLAGS: Bowel or bladder incontinence: Yes: bladder (urge incontinence) Spinal tumors: No Cauda equina syndrome: No Compression fracture: No Abdominal aneurysm: No  COGNITION: Overall cognitive status: Within functional limits for tasks assessed     SENSATION: WFL Intermittent brief R LE radicular pain  MUSCLE LENGTH: Hamstrings: mild tight B Quads: mod tight B  POSTURE: No Significant postural limitations  PALPATION: Lumbar paraspinals &  glutes - muscle tension WNL w/o TTP  LUMBAR ROM:   AROM eval 04/26/22  Flexion <25% WNL- just weak coming back up  Extension 50% 25%  Right lateral flexion 25% WNL  Left lateral flexion 25% WNL  Right rotation WNL   Left rotation WNL    (Blank rows = not tested)  LOWER EXTREMITY ROM:     Grossly WFL but accessory trunk lean in standing   LOWER EXTREMITY MMT:    MMT Right eval Left eval Right 04/26/22 Left 04/26/22  Hip flexion 2+ 3+ 4- 4-  Hip extension 2+ 2+ 4 4  Hip abduction 4- _0 Hip adduction 2+ 3+ 4 4+  Hip internal rotation 4+ 4+    Hip external rotation 4+ 4+    Knee flexion 5 5    Knee extension 5 5    Ankle dorsiflexion 5 4    Ankle plantarflexion      Ankle inversion      Ankle eversion       (Blank rows = not tested)  LUMBAR SPECIAL TESTS:  Straight leg raise test: Negative   GAIT: Distance walked: 60 feet, back and forth from exam room Assistive device utilized: Single point cane Level of assistance: Modified independence Comments: increased lumbar extension  TODAY'S TREATMENT:  DATE:  05/03/22 THERAPEUTIC EXERCISE: to improve flexibility, strength and mobility.  Verbal and tactile cues throughout for technique. Rec Bike L2 x 6 min Seated lumbar flexion stretch green yoga ball 2x30 sec Seated trunk extension blue TB 2x10 Seated row x 10 blue TB Bending over and picking up bean bag 4x - cues for wide BOS and hip hinge Prone hip extension x 10 BLE  Manual Therapy: STM to bil upper glutes and lumbar paraspinals  04/12/22 THERAPEUTIC EXERCISE: to improve flexibility, strength and mobility.  Verbal and tactile cues throughout for technique. Rec Bike L2 x 6 min LE MMT and lumbar ROM assessment Seated deadlift with blue weight ball x 10 Standing RDL x 5 with 1 arm support Quadruped arm raises x 5 bil Seated lumbar  extension blue TB x 10  04/12/22 THERAPEUTIC EXERCISE: to improve flexibility, strength and mobility.  Verbal and tactile cues throughout for technique. Rec Bike L2 x 6 min Step downs/step ups 4 inch and 6 inch x 10 ea with intermittent UE support to no UE support  Step Ups 6 inch x 20 with L focusing on slight forward trunk lean to normalize form and decrease LOB bwd. Hip flexor stretch is sitting x 10 sec ea for review Standing lumbar ext at wall for hip flexor stretch 2 x 5 breaths Attempted Good Mornings in seated position x 5 but pt felt some pain in low back (possibly try in Mini squat position next visit) Mini Squat with bil OH reach 5 sec hold then shoulder ext x 5 sec; 1 set of 1, 2 sets of 2 Weight shifting fwd/bwd in front of mat table to get patient used to changes in COG  SELF CARE: Visualization of stepping down/up curb to help with confidence in performing activity    04/12/22 THERAPEUTIC EXERCISE: to improve flexibility, strength and mobility.  Verbal and tactile cues throughout for technique. Rec Bike L2 x 6 min Step ups fwd 2x5  BLE - 1 sit break - UE support Standing high knees 2x5 - UE support STS x 12 reps from elevated seat ( 2 airex pads) Supine heel slides x 10 BLE  Manual Therapy: STM to R quads and gastroc Passive stretch R quads and gastroc  04/10/22 THERAPEUTIC EXERCISE: to improve flexibility, strength and mobility.  Verbal and tactile cues throughout for technique. Rec Bike L2 x 6 min Supine Glute Bridges x10- cues for 5 sec hold  Hooklying PPT with heel walking out and in x10- cues for TrA contraction Seated hip ADD iso ball squeeze 10x5 sec hold Seated hip ADD iso ball squeeze + LAQ x10  Standing hip ABD with RTB at ankles x10 R/L Standing hip ext with RTB at ankles x10 R/L Standing hip flexion with RTB at ankles x10 R/L Heel Raise & Toe Raise 2x10  Mini Squat using chair for balance x10- cues to hinge at hip instead of knees GAIT TRAINING: To  normalize gait pattern. 300 ft with no AD- cueing for reciprocal arm swing, heel to toe stepping, and looking forward during walking  PATIENT EDUCATION:  Education details: HEP progression - functional mini squat with bil OH shoulder fleixon then extension for lumbar strength and visualization see self care. Person educated: Patient Education method: Explanation, Demonstration, Verbal cues, and Handouts Education comprehension: verbalized understanding and returned demonstration  HOME EXERCISE PROGRAM: Access Code: [email or web address] URL: [email or web address] Date: 04/17/2022 Prepared by: Almyra Free  Exercises - Hooklying Hamstring Stretch with Strap  - 2-3 x daily - 7 x  weekly - 3 reps - 30 sec hold - Standing Back Extension at [place]  - 2-3 x daily - 7 x weekly - 2 sets - 15 reps - 3 sec hold - Tourist information centre manager with Chair and Counter Support  - 2-3 x daily - 7 x weekly - 3 reps - 30 sec hold - Supine Posterior Pelvic Tilt  - 1 x daily - 7 x weekly - 3 sets - 10 reps - Hooklying Clamshell with Resistance  - 1 x daily - 7 x weekly - 2 sets - 10 reps - Supine March with Resistance Band  - 1 x daily - 7 x weekly - 3 sets - 10 reps - Seated Hip Adduction Isometrics with Ball  - 1 x daily - 7 x weekly - 2 sets - 10 reps - 5 hold - Supine March with Alternating Leg Lifts  - 1 x daily - 7 x weekly - 3 sets - 10 reps - Supine Pelvic Tilt with Straight Leg Raise  - 1 x daily - 7 x weekly - 2 sets - 10 reps - Heel Toe Raises with Counter Support  - 1 x daily - 3-4 x weekly - 2 sets - 10 reps - 3 sec hold - Standing Hip Flexion with Resistance Loop  - 1 x daily - 3-4 x weekly - 2 sets - 10 reps - 3 sec hold - Hip Abduction with Resistance Loop  - 1 x daily - 3-4 x weekly - 2 sets - 10 reps - 3 sec hold - Hip Extension with Resistance Loop  - 1 x daily - 3-4 x weekly - 2 sets - 10 reps - 3 sec hold - Squat with Medicine [employer]  - 1 x daily - 7 x weekly - 2-3 sets - 5 reps - 5 sec   hold  ASSESSMENT:  CLINICAL IMPRESSION: Favour continues to note difficulty with bending over to pick items up from the floor. Focused skilled interventions on strengthening lumbar extensor muscles to improve tolerance for bending over during household cleaning chores. She had some increased pain with picking up the bean bag today, so we followed with STM for pain and to reduce muscle tension. Post manual she noted decreased pain. Updated HEP to focus more on spinal erector strengthening. She was able to do STS transfer w/o UE support, she has met LTG# 6. Ramesha continues to demonstrate potential for improvement and would benefit from continued skilled therapy to address impairments.     OBJECTIVE IMPAIRMENTS:  Abnormal gait, decreased activity tolerance, decreased balance, decreased endurance, decreased mobility, difficulty walking, decreased ROM, decreased strength, hypomobility, impaired perceived functional ability, impaired flexibility, improper body mechanics, and pain.   ACTIVITY LIMITATIONS: carrying, lifting, bending, sitting, standing, squatting, stairs, bed mobility, continence, bathing, toileting, dressing, and locomotion level  PARTICIPATION LIMITATIONS: cleaning, laundry, shopping, community activity, occupation, and attending festivals and ball games with grandchildren  PERSONAL FACTORS: Past/current experiences, Time since onset of injury/illness/exacerbation, and 3+ comorbidities: Diabetes, Osteoarthritis, Anxiety, GERD, R shoulder replacement (10/2015), and L TKA are also affecting patient's functional outcome.   REHAB POTENTIAL: Good  CLINICAL DECISION MAKING: Evolving/moderate complexity  EVALUATION COMPLEXITY: Moderate   GOALS: Goals reviewed with patient? Yes  SHORT TERM GOALS: Target date: 04/24/2022  Pt will perform HEP independently to demonstrate understanding of activity. Baseline: Goal status: MET 04/10/22  2.  Pt will have a 25% reduction in pain during  mobility to improve everyday activity. Baseline:  Goal status: MET 04/10/22,  Pt reports 100% improvement.  3.  Pt will have a 25% increase in lumbar mobility to perform ADLs such as toileting and bathing. Baseline:  Goal status: MET- 04/26/22  4.  Pt will improve strength of proximal hip muscles to >/= 3/5 to increase ability to stand from a chair and walk.  Baseline:  Goal status: MET 04/26/22    LONG TERM GOALS: Target date: 05/22/22  Pt will perform HEP independently to demonstrate understanding of activity. Baseline:  Goal status: IN PROGRESS  2.  Pt will have 75% reduction in pain during mobility to improve everyday activity. Baseline:  Goal status: MET 04/10/22 Pt reports 100% improvement in pain.   3. Pt will have a 75% increase in lumbar mobility to perform ADLs such as toileting and bathing. Baseline:  Goal status: IN PROGRESS  4.  Pt will improve strength of proximal hip muscles to >/=4-/5 to increase ability to stand from a chair and walk.  Baseline:  Goal status: IN PROGRESS  5.  Pt will improve modified Oswestry to 26% to show increase in functional activity. Baseline:  19 / 50 = 38.0 % Goal status: IN PROGRESS  6.  Pt will perform STS with modified independence using single point cane to demonstrate independence and ability to stand from chair.  Baseline:  Goal status: MET- 05/03/22   PLAN:  PT FREQUENCY: 2x/week  PT DURATION: 8 weeks  PLANNED INTERVENTIONS: Therapeutic exercises, Therapeutic activity, Neuromuscular re-education, Balance training, Gait training, Patient/Family education, Self Care, Joint mobilization, Joint manipulation, Stair training, Dry Needling, Electrical stimulation, Spinal manipulation, Spinal mobilization, Cryotherapy, Moist heat, Manual therapy, and Re-evaluation.  PLAN FOR NEXT SESSION: emphasize spinal erector strength; progress lumbopelvic mobility and core strengthening, progress proximal hip muscle strengthening, include  rotation mobility for ease of self cleaning, gait, balance  Clarene Essex, PTA 05/03/2022, 9:55 AM

## 2022-05-04 ENCOUNTER — Encounter (HOSPITAL_BASED_OUTPATIENT_CLINIC_OR_DEPARTMENT_OTHER): Payer: Self-pay | Admitting: Pulmonary Disease

## 2022-05-04 ENCOUNTER — Ambulatory Visit (HOSPITAL_BASED_OUTPATIENT_CLINIC_OR_DEPARTMENT_OTHER): Payer: Medicare Other | Admitting: Pulmonary Disease

## 2022-05-04 VITALS — BP 122/84 | HR 91 | Temp 99.2°F | Ht 66.0 in | Wt 292.2 lb

## 2022-05-04 DIAGNOSIS — G4733 Obstructive sleep apnea (adult) (pediatric): Secondary | ICD-10-CM

## 2022-05-04 DIAGNOSIS — R052 Subacute cough: Secondary | ICD-10-CM | POA: Diagnosis not present

## 2022-05-04 DIAGNOSIS — J301 Allergic rhinitis due to pollen: Secondary | ICD-10-CM

## 2022-05-04 MED ORDER — AZITHROMYCIN 250 MG PO TABS: 250.0000 mg | ORAL_TABLET | Freq: Every day | ORAL | 0 refills | Status: DC

## 2022-05-04 NOTE — Assessment & Plan Note (Signed)
She is compliant by report. Will check CPAP download. CPAP supplies will be renewed for a year  Weight loss encouraged, compliance with goal of at least 4-6 hrs every night is the expectation. Advised against medications with sedative side effects Cautioned against driving when sleepy - understanding that sleepiness will vary on a day to day basis

## 2022-05-04 NOTE — Progress Notes (Signed)
   Subjective:    Patient ID: [name], female    DOB: 01/18/1956, 66 y.o.   MRN: [id number]  HPI  66 yo for FU of OSA    C/o cough last visit, CXR appeared prominent interstitium,HRCT was nml   PMH -hypertension, diabetes, "asthma" but she reports using albuterol MDI only for episodes of bronchitis Covid infection 02/2020  Chief Complaint  Patient presents with   Follow-up    Pt states she had back surgery in April. Pt states she has had a productive cough x 2 months. Pt states that her PCP gave her an inhaler and medrol but it did not help.   Annual follow-up visit She underwent back surgery, complicated by infection and took a long time to heal She reports a cough productive of clear white phlegm for 2 months, PCP felt that it was a "virus", she took Flonase, albuterol MDI and Medrol but cough persists. She has tried OTC Robitussin and Delsym cough syrup without benefit. Prednisone makes her mean and Mucinex kept her awake at night  She is compliant with her CPAP machine, has settled down with nasal pillows, denies any problems with mask or pressure  Significant tests/ events reviewed 12/2019 HST HST AHI 15/hour, worse in supine position 11/2019 HRCT >> no ILD 04/2012 NPSG >> mod OSA, 15/h with nadir desatn 71% corre ected by CPAP 8cm, small full face mask  Review of Systems neg for any significant sore throat, dysphagia, itching, sneezing, nasal congestion or excess/ purulent secretions, fever, chills, sweats, unintended wt loss, pleuritic or exertional cp, hempoptysis, orthopnea pnd or change in chronic leg swelling. Also denies presyncope, palpitations, heartburn, abdominal pain, nausea, vomiting, diarrhea or change in bowel or urinary habits, dysuria,hematuria, rash, arthralgias, visual complaints, headache, numbness weakness or ataxia.     Objective:   Physical Exam  Gen. Pleasant, obese, in no distress ENT - no lesions, no post nasal drip Neck: No JVD, no  thyromegaly, no carotid bruits Lungs: no use of accessory muscles, no dullness to percussion, decreased without rales or rhonchi  Cardiovascular: Rhythm regular, heart sounds  normal, no murmurs or gallops, no peripheral edema Musculoskeletal: No deformities, no cyanosis or clubbing , no tremors       Assessment & Plan:

## 2022-05-04 NOTE — Patient Instructions (Signed)
  X CPAP download  X Z-Pak Use Chlor-Trimeton 4 mg tabs OTC nightly for 2 weeks Okay to use Robitussin in the daytime as needed for cough

## 2022-05-04 NOTE — Assessment & Plan Note (Signed)
Continue Flonase. Added Chlor-Trimeton

## 2022-05-04 NOTE — Assessment & Plan Note (Signed)
Subacute cough for 2 months likely related to postnasal drip symptoms and timing. Will ask her to use Chlor-Trimeton for 2 weeks. Will give Z-Pak for acute bronchitis. She can use Robitussin as needed in the daytime

## 2022-05-08 ENCOUNTER — Ambulatory Visit: Payer: Medicare Other | Admitting: Physical Therapy

## 2022-05-08 ENCOUNTER — Encounter: Payer: Self-pay | Admitting: Physical Therapy

## 2022-05-08 DIAGNOSIS — R2689 Other abnormalities of gait and mobility: Secondary | ICD-10-CM | POA: Diagnosis not present

## 2022-05-08 DIAGNOSIS — M5459 Other low back pain: Secondary | ICD-10-CM

## 2022-05-08 DIAGNOSIS — M6281 Muscle weakness (generalized): Secondary | ICD-10-CM

## 2022-05-08 NOTE — Therapy (Addendum)
OUTPATIENT PHYSICAL THERAPY TREATMENT  Progress Note  Reporting Period 03/27/2022 to 05/08/2022  See note below for Objective Data and Assessment of Progress/Goals.     Patient Name: [name] MRN: [id number] DOB:1955/11/30, 66 y.o., female Today's Date: 03/24/2022   PT End of Session - 05/08/22 0930     Visit Number 10    Date for PT Re-Evaluation 05/22/22    Authorization Type [employer] Medicare    PT Start Time 0930    PT Stop Time 7473    PT Time Calculation (min) 45 min    Activity Tolerance Patient tolerated treatment well    Behavior During Therapy [facility] for tasks assessed/performed                     Past Medical History:  Diagnosis Date   Allergic rhinitis    Allergy    Anemia 07/17/2016   Anxiety    Asthma    seasonal   Blood transfusion without reported diagnosis    Breast cancer ([place])    right   Bronchitis    Colon polyps    CPAP (continuous positive airway pressure) dependence    Depression    Diabetes mellitus    borderline but takes metformin   Edema    Family history of breast cancer in first degree relative    Fibromyalgia    GERD (gastroesophageal reflux disease)    Hyperglycemia 03/05/2017   Hyperlipidemia 08/21/2016   no meds   Hyperparathyroidism    Hypertension    controlled by medications   Joint pain    Neuromuscular disorder ([place])    Fibromyalgia   Obesity    Osteoarthritis    RA   PONV (postoperative nausea and vomiting)    occasional   Pre-diabetes    Sleep apnea    wears cpap   Viral meningitis    Vitamin D deficiency     Past Surgical History:  Procedure Laterality Date   ABDOMINAL HYSTERECTOMY  1998   ADENOIDECTOMY     BACK SURGERY     09/21/21   BREAST EXCISIONAL BIOPSY Left 1993   benign   BREAST SURGERY     Tran flap due to breast cancer   CESAREAN SECTION  1988   COLONOSCOPY  08/14/2011   HAND SURGERY     dog bite, right hand   HAND SURGERY     trauma, left hand   KNEE ARTHROSCOPY      bilateral   left knee replacement  2010   LUMBAR WOUND DEBRIDEMENT N/A 10/07/2021   Procedure: LUMBAR WOUND DEBRIDEMENT/REVISION;  Surgeon: Ashok Pall, MD;  Location: [place];  Service: Neurosurgery;  Laterality: N/A;  3C/RM 21 to follow Dr Kathyrn Sheriff   MASTECTOMY  01/13/1994   Right breast   parathyroid resection     PARATHYROIDECTOMY     POLYPECTOMY     rectal abscess     SEPTOPLASTY  1980   SHOULDER ARTHROSCOPY Right 11/09/2015   Procedure: ARTHROSCOPY SHOULDER-acromioplasty, distal clavicle resection and debridement;  Surgeon: Melrose Nakayama, MD;  Location: [place];  Service: Orthopedics;  Laterality: Right;   shoulder arthroscopy     rotator cuff repair   TOE SURGERY Right    paronychia and adenoma removed   TONSILLECTOMY     TOTAL KNEE ARTHROPLASTY  06/18/2008   [place]   TUMOR REMOVAL  1982   , scalp    Patient Active Problem List   Diagnosis Date Noted   Elevated sed rate 04/03/2022  Viral bronchitis 03/22/2022   Low back pain 01/11/2022   Drainage from wound 10/07/2021   Spondylolisthesis of lumbar region 09/21/2021   DDD (degenerative disc disease), cervical 05/04/2021   Neck pain 05/02/2021   Left arm pain 01/21/2021   Memory changes 04/19/2020   Educated about COVID-19 virus infection 04/19/2020   Peripheral neuropathy 04/19/2020   TMJ arthralgia 04/19/2020   Urinary incontinence 10/27/2019   Other peripheral vertigo, unspecified ear 10/27/2019   Vertigo 03/14/2018   RLS (restless legs syndrome) 06/07/2017   Right knee pain 12/06/2016   Arthralgia 12/03/2016   Hyperlipidemia 08/21/2016   Anemia 07/17/2016   Genetic testing 05/15/2016   Allergic urticaria 03/27/2016   Dermographia 03/27/2016   History of breast cancer 03/27/2016   Rheumatoid arthritis ([place]) 06/24/2015   Diverticulitis of colon without hemorrhage 09/21/2014   Asthma with acute exacerbation 07/14/2014   Cough 07/06/2014   Gout of big toe 03/10/2013   Preventative health care 10/08/2012    OSA (obstructive sleep apnea) 04/23/2012   Diabetes mellitus, type 2 ([place]) 12/10/2011   Diarrhea 09/04/2011   Personal history of colonic polyps - adenoma 08/14/2011   Fatigue 08/03/2011   Ear canal dryness 08/03/2011   Palpitations 09/21/2010   Morbid obesity ([place]) 09/21/2010   PAC (premature atrial contraction) 09/21/2010   ATTENTION DEFICIT DISORDER, INATTENTIVE TYPE 09/22/2009   Acute sinusitis 09/22/2009   VIRAL MENINGITIS, HX OF 04/29/2009   Insomnia 02/15/2009   Vitamin D deficiency 03/11/2008   Fibromyalgia 03/05/2008   HYPERPARATHYROIDISM, HX OF 08/06/2007   Allergic rhinitis 07/05/2007   Osteoarthritis 02/28/2007   Edema 11/01/2006   Depression with anxiety 06/25/2006   Essential hypertension 06/25/2006   Gastroesophageal reflux disease without esophagitis 06/25/2006     PCP: Mosie Lukes, MD  REFERRING PROVIDER: Mosie Lukes, MD  REFERRING DIAG: M54.41 (ICD-10-CM) - Right-sided low back pain with right-sided sciatica, unspecified chronicity  Rationale for Evaluation and Treatment: Rehabilitation  THERAPY DIAG:  Other low back pain  Muscle weakness (generalized)  Other abnormalities of gait and mobility   ONSET DATE: 09/21/21 - LUMBAR THREE-FOUR, LUMBAR FOUR-FIVE POSTERIOR LUMBAR INTERBODY FUSION   SUBJECTIVE:                                                                                                                                                                                           SUBJECTIVE STATEMENT: Pt states that she is having nerve pain in her R/L buttock, she states that it is a shocking pain that occurs occasionally but has points that hurt when pressure is applied on both sides.    PERTINENT HISTORY:  Diabetes, Osteoarthritis, Anxiety,  GERD, R shoulder replacement (10/2015), L TKA, Back surgery (4/23)  PAIN:  Are you having pain? Yes: NPRS scale: 3/10 Pain location: buttocks Pain description: discomfort Aggravating factors:  moving in bed, bending forward, toilet hygiene  Relieving factors: ibuprofen, Tylenol, ice  PRECAUTIONS: None  WEIGHT BEARING RESTRICTIONS: No  FALLS:  Has patient fallen in last 6 months? No  LIVING ENVIRONMENT: Lives with: lives alone Lives in: House/apartment Stairs: No Has following equipment at home: Single point cane and elevated commode with bidet   OCCUPATION: PT - accounting, mostly deskwork  PLOF: Independent, Needs assistance with ADLs (difficulty with toilet hygiene), and Leisure: time with family.  PATIENT GOALS: "To be able to walk and be active again. To be able to clean myself after using the toilet. To be able to bend over an pick things up."   OBJECTIVE:   DIAGNOSTIC FINDINGS:  MRI Lumbar Spine w/o Contrast: 01/07/22  IMPRESSION: 1. Postsurgical changes from L3-L5 PLIF. Bone marrow signal changes within the spinous processes and bilateral lamina of L2, L3, and L4 raise the suspicion for osteomyelitis. 2. Previously described T2 hyperintense material along the right lateral and dorsal epidural space at the L2 and L2-3 levels is not seen on the current study. No IV contrast was administered on the current exam. No new epidural space abnormality is identified. 3. Similar degree of canal stenosis at L2-3.  CT Lumbar Spine w/o Contrast: 01/07/22 IMPRESSION: 1. No CT evidence for acute abnormality within the lumbar spine. 2. Postoperative changes from prior PLIF at L3-4 and L4-5. Mild subsidence at L3-4. No other hardware complication. 3. Underlying degenerative spondylosis and facet arthrosis, better appreciated and described on recent MRIs.  PATIENT SURVEYS:  Modified Oswestry:  19 / 50 = 38.0 %  FOTO: NT  SCREENING FOR RED FLAGS: Bowel or bladder incontinence: Yes: bladder (urge incontinence) Spinal tumors: No Cauda equina syndrome: No Compression fracture: No Abdominal aneurysm: No  COGNITION: Overall cognitive status: Within functional limits  for tasks assessed     SENSATION: WFL Intermittent brief R LE radicular pain  MUSCLE LENGTH: Hamstrings: mild tight B Quads: mod tight B  POSTURE: No Significant postural limitations  PALPATION: Lumbar paraspinals & glutes - muscle tension WNL w/o TTP  LUMBAR ROM:   AROM eval 04/26/22  Flexion <25% WNL- just weak coming back up  Extension 50% 25%  Right lateral flexion 25% WNL  Left lateral flexion 25% WNL  Right rotation WNL   Left rotation WNL    (Blank rows = not tested)  LOWER EXTREMITY ROM:     Grossly WFL but accessory trunk lean in standing   LOWER EXTREMITY MMT:    MMT Right eval Left eval Right 04/26/22 Left 04/26/22  Hip flexion 2+ 3+ 4- 4-  Hip extension 2+ 2+ 4 4  Hip abduction 4- _0 Hip adduction 2+ 3+ 4 4+  Hip internal rotation 4+ 4+    Hip external rotation 4+ 4+    Knee flexion 5 5    Knee extension 5 5    Ankle dorsiflexion 5 4    Ankle plantarflexion      Ankle inversion      Ankle eversion       (Blank rows = not tested)  LUMBAR SPECIAL TESTS:  Straight leg raise test: Negative   GAIT: Distance walked: 60 feet, back and forth from exam room Assistive device utilized: Single point cane Level of assistance: Modified independence Comments: increased lumbar extension  TODAY'S TREATMENT:  DATE:   05/08/22 THERAPEUTIC EXERCISE: to improve flexibility, strength and mobility.  Verbal and tactile cues throughout for technique. Rec Bike L3 x 6 min STM + TrP release of R/L glute med/min  MANUAL THERAPY: To promote normalized muscle tension and reduced pain. Skilled palpation and monitoring of soft tissue during DN Trigger Point Dry-Needling  Treatment instructions: Expect mild to moderate muscle soreness. Patient verbalized understanding of these instructions and education. Patient Consent Given:  Yes Education handout provided: Yes Muscles treated: R/L glute med/min Electrical stimulation performed: No Parameters: N/A Treatment response/outcome: Twitch Response Elicited and Palpable Increase in Muscle Length STM/DTM, manual TPR and pin & stretch to muscles addressed with DN   05/03/22 THERAPEUTIC EXERCISE: to improve flexibility, strength and mobility.  Verbal and tactile cues throughout for technique. Rec Bike L2 x 6 min Seated lumbar flexion stretch green yoga ball 2x30 sec Seated trunk extension blue TB 2x10 Seated row x 10 blue TB Bending over and picking up bean bag 4x - cues for wide BOS and hip hinge Prone hip extension x 10 BLE  Manual Therapy: STM to bil upper glutes and lumbar paraspinals   04/26/22 THERAPEUTIC EXERCISE: to improve flexibility, strength and mobility.  Verbal and tactile cues throughout for technique. Rec Bike L2 x 6 min LE MMT and lumbar ROM assessment Seated deadlift with blue weight ball x 10 Standing RDL x 5 with 1 arm support Quadruped arm raises x 5 bil Seated lumbar extension blue TB x 10    PATIENT EDUCATION:  Education details: HEP review, role of DN, DN rational, procedure, outcomes, potential side effects, and recommended post-treatment exercises/activity, and visualization see self care. Person educated: Patient Education method: Explanation, Demonstration, Verbal cues, and Handouts Education comprehension: verbalized understanding and returned demonstration  HOME EXERCISE PROGRAM: Access Code: [email or web address] URL: [email or web address] Date: 04/17/2022 Prepared by: Almyra Free  Exercises - Hooklying Hamstring Stretch with Strap  - 2-3 x daily - 7 x weekly - 3 reps - 30 sec hold - Standing Back Extension at [place]  - 2-3 x daily - 7 x weekly - 2 sets - 15 reps - 3 sec hold - Tourist information centre manager with Chair and Counter Support  - 2-3 x daily - 7 x weekly - 3 reps - 30 sec hold - Supine Posterior Pelvic Tilt  - 1 x daily - 7 x  weekly - 3 sets - 10 reps - Hooklying Clamshell with Resistance  - 1 x daily - 7 x weekly - 2 sets - 10 reps - Supine March with Resistance Band  - 1 x daily - 7 x weekly - 3 sets - 10 reps - Seated Hip Adduction Isometrics with Ball  - 1 x daily - 7 x weekly - 2 sets - 10 reps - 5 hold - Supine March with Alternating Leg Lifts  - 1 x daily - 7 x weekly - 3 sets - 10 reps - Supine Pelvic Tilt with Straight Leg Raise  - 1 x daily - 7 x weekly - 2 sets - 10 reps - Heel Toe Raises with Counter Support  - 1 x daily - 3-4 x weekly - 2 sets - 10 reps - 3 sec hold - Standing Hip Flexion with Resistance Loop  - 1 x daily - 3-4 x weekly - 2 sets - 10 reps - 3 sec hold - Hip Abduction with Resistance Loop  - 1 x daily - 3-4 x weekly - 2 sets - 10 reps - 3 sec hold -  Hip Extension with Resistance Loop  - 1 x daily - 3-4 x weekly - 2 sets - 10 reps - 3 sec hold - Squat with Medicine [employer]  - 1 x daily - 7 x weekly - 2-3 sets - 5 reps - 5 sec  hold  ASSESSMENT:  CLINICAL IMPRESSION: Corean presents with complaints of nerve pain in her R/L buttocks that occurs occasionally and lasts for a brief period of time. She also states that she still has difficulty standing back up and using extensor chain strength. TrPs and increased tension in BIL glute med/glute min muscles were palpated. This session focused on MT and DN to reduce muscle tension in R/L glutes to decrease pain during functional movements. She was able to tolerate session well with some immediate tension release during DN. Royetta has met all of her short-term goals and is demonstrating good progress with her long-term goals. She will continue to benefit from therapy to address limitations of functional activity and strength of extensor chain muscles.   OBJECTIVE IMPAIRMENTS:  Abnormal gait, decreased activity tolerance, decreased balance, decreased endurance, decreased mobility, difficulty walking, decreased ROM, decreased strength,  hypomobility, impaired perceived functional ability, impaired flexibility, improper body mechanics, and pain.   ACTIVITY LIMITATIONS: carrying, lifting, bending, sitting, standing, squatting, stairs, bed mobility, continence, bathing, toileting, dressing, and locomotion level  PARTICIPATION LIMITATIONS: cleaning, laundry, shopping, community activity, occupation, and attending festivals and ball games with grandchildren  PERSONAL FACTORS: Past/current experiences, Time since onset of injury/illness/exacerbation, and 3+ comorbidities: Diabetes, Osteoarthritis, Anxiety, GERD, R shoulder replacement (10/2015), and L TKA are also affecting patient's functional outcome.   REHAB POTENTIAL: Good  CLINICAL DECISION MAKING: Evolving/moderate complexity  EVALUATION COMPLEXITY: Moderate   GOALS: Goals reviewed with patient? Yes  SHORT TERM GOALS: Target date: 04/24/2022  Pt will perform HEP independently to demonstrate understanding of activity. Baseline: Goal status: MET 04/10/22  2.  Pt will have a 25% reduction in pain during mobility to improve everyday activity. Baseline:  Goal status: MET 04/10/22, Pt reports 100% improvement.  3.  Pt will have a 25% increase in lumbar mobility to perform ADLs such as toileting and bathing. Baseline:  Goal status: MET- 04/26/22  4.  Pt will improve strength of proximal hip muscles to >/= 3/5 to increase ability to stand from a chair and walk.  Baseline:  Goal status: MET 04/26/22  LONG TERM GOALS: Target date: 05/22/22  Pt will perform HEP independently to demonstrate understanding of activity. Baseline:  Goal status: MET 05/08/22  2.  Pt will have 75% reduction in pain during mobility to improve everyday activity. Baseline:  Goal status: MET 04/10/22 Pt reports 100% improvement in pain.   3. Pt will have a 75% increase in lumbar mobility to perform ADLs such as toileting and bathing. Baseline:  Goal status: IN PROGRESS  4.  Pt will  improve strength of proximal hip muscles to >/=4-/5 to increase ability to stand from a chair and walk.  Baseline:  Goal status: IN PROGRESS  5.  Pt will improve modified Oswestry to 26% to show increase in functional activity. Baseline:  19 / 50 = 38.0 % Goal status: IN PROGRESS  6.  Pt will perform STS with modified independence using single point cane to demonstrate independence and ability to stand from chair.  Baseline:  Goal status: MET- 05/03/22   PLAN:  PT FREQUENCY: 2x/week  PT DURATION: 8 weeks  PLANNED INTERVENTIONS: Therapeutic exercises, Therapeutic activity, Neuromuscular re-education, Balance training, Gait training,  Patient/Family education, Self Care, Joint mobilization, Joint manipulation, Stair training, Dry Needling, Electrical stimulation, Spinal manipulation, Spinal mobilization, Cryotherapy, Moist heat, Manual therapy, and Re-evaluation.  PLAN FOR NEXT SESSION: assess response to DN, emphasize spinal erector strength; MT & DN of glutes as needed; progress lumbopelvic mobility and core strengthening, progress proximal hip muscle strengthening, include rotation mobility for ease of self cleaning, gait, balance    Zeb Comfort, Student-PT 05/08/2022, 12:21 PM

## 2022-05-09 ENCOUNTER — Telehealth: Payer: Self-pay | Admitting: Pharmacist

## 2022-05-09 NOTE — Telephone Encounter (Signed)
Patient was on list of patients with low adherence for diabetes medications (MAD) and ACEi / [facility]) for 2023.  Reivewed her refill history for 2023. She started [facility] but cost was too high after she reached Medicare coverage gap in March 2023 so she stopped.  Filled Mounjaro 28 day supply 06/07/2021 and 07/17/2021.  Filled metformin '500mg'$  bid - 100 day supply 12/20/2021 and 02/28/2022 Estimated percentage of days covered by DM meds in 2023 is: 253 / 355 = about 71.3%  Discussed possibly applying for Ozempic medication assistance program for 2024. Patient will come by office to fill out and sign application tomorrow.   Reviewed med refill history for telmisartan - filled 90 days 1/26 and 09/30/2021. Filled 100 days 02/28/2022.  Estimated percentage of days covered in 2023 is: 269 / 339 = about 79.3%  Patient endorses that she had both telmisartan and metfomin and is taking according to how prescribed.

## 2022-05-10 ENCOUNTER — Ambulatory Visit: Payer: Medicare Other

## 2022-05-10 ENCOUNTER — Ambulatory Visit (INDEPENDENT_AMBULATORY_CARE_PROVIDER_SITE_OTHER): Payer: Medicare Other | Admitting: Pharmacist

## 2022-05-10 DIAGNOSIS — E1140 Type 2 diabetes mellitus with diabetic neuropathy, unspecified: Secondary | ICD-10-CM | POA: Diagnosis not present

## 2022-05-10 DIAGNOSIS — M6281 Muscle weakness (generalized): Secondary | ICD-10-CM

## 2022-05-10 DIAGNOSIS — I10 Essential (primary) hypertension: Secondary | ICD-10-CM | POA: Diagnosis not present

## 2022-05-10 DIAGNOSIS — R2689 Other abnormalities of gait and mobility: Secondary | ICD-10-CM | POA: Diagnosis not present

## 2022-05-10 DIAGNOSIS — M5459 Other low back pain: Secondary | ICD-10-CM | POA: Diagnosis not present

## 2022-05-10 NOTE — Therapy (Signed)
OUTPATIENT PHYSICAL THERAPY TREATMENT   Patient Name: [name] MRN: [id number] DOB:11-11-1955, 66 y.o., female Today's Date: 03/24/2022   PT End of Session - 05/10/22 1023     Visit Number 11    Date for PT Re-Evaluation 05/22/22    Authorization Type [employer] Medicare    PT Start Time 0930    PT Stop Time 6659    PT Time Calculation (min) 45 min    Activity Tolerance Patient tolerated treatment well    Behavior During Therapy [facility] for tasks assessed/performed                      Past Medical History:  Diagnosis Date   Allergic rhinitis    Allergy    Anemia 07/17/2016   Anxiety    Asthma    seasonal   Blood transfusion without reported diagnosis    Breast cancer ([place])    right   Bronchitis    Colon polyps    CPAP (continuous positive airway pressure) dependence    Depression    Diabetes mellitus    borderline but takes metformin   Edema    Family history of breast cancer in first degree relative    Fibromyalgia    GERD (gastroesophageal reflux disease)    Hyperglycemia 03/05/2017   Hyperlipidemia 08/21/2016   no meds   Hyperparathyroidism    Hypertension    controlled by medications   Joint pain    Neuromuscular disorder ([place])    Fibromyalgia   Obesity    Osteoarthritis    RA   PONV (postoperative nausea and vomiting)    occasional   Pre-diabetes    Sleep apnea    wears cpap   Viral meningitis    Vitamin D deficiency     Past Surgical History:  Procedure Laterality Date   ABDOMINAL HYSTERECTOMY  1998   ADENOIDECTOMY     BACK SURGERY     09/21/21   BREAST EXCISIONAL BIOPSY Left 1993   benign   BREAST SURGERY     Tran flap due to breast cancer   CESAREAN SECTION  1988   COLONOSCOPY  08/14/2011   HAND SURGERY     dog bite, right hand   HAND SURGERY     trauma, left hand   KNEE ARTHROSCOPY     bilateral   left knee replacement  2010   LUMBAR WOUND DEBRIDEMENT N/A 10/07/2021   Procedure: LUMBAR WOUND DEBRIDEMENT/REVISION;   Surgeon: Ashok Pall, MD;  Location: [place];  Service: Neurosurgery;  Laterality: N/A;  3C/RM 21 to follow Dr Kathyrn Sheriff   MASTECTOMY  01/13/1994   Right breast   parathyroid resection     PARATHYROIDECTOMY     POLYPECTOMY     rectal abscess     SEPTOPLASTY  1980   SHOULDER ARTHROSCOPY Right 11/09/2015   Procedure: ARTHROSCOPY SHOULDER-acromioplasty, distal clavicle resection and debridement;  Surgeon: Melrose Nakayama, MD;  Location: [place];  Service: Orthopedics;  Laterality: Right;   shoulder arthroscopy     rotator cuff repair   TOE SURGERY Right    paronychia and adenoma removed   TONSILLECTOMY     TOTAL KNEE ARTHROPLASTY  06/18/2008   [place]   TUMOR REMOVAL  1982   , scalp    Patient Active Problem List   Diagnosis Date Noted   Elevated sed rate 04/03/2022   Viral bronchitis 03/22/2022   Low back pain 01/11/2022   Drainage from wound 10/07/2021   Spondylolisthesis of  lumbar region 09/21/2021   DDD (degenerative disc disease), cervical 05/04/2021   Neck pain 05/02/2021   Left arm pain 01/21/2021   Memory changes 04/19/2020   Educated about COVID-19 virus infection 04/19/2020   Peripheral neuropathy 04/19/2020   TMJ arthralgia 04/19/2020   Urinary incontinence 10/27/2019   Other peripheral vertigo, unspecified ear 10/27/2019   Vertigo 03/14/2018   RLS (restless legs syndrome) 06/07/2017   Right knee pain 12/06/2016   Arthralgia 12/03/2016   Hyperlipidemia 08/21/2016   Anemia 07/17/2016   Genetic testing 05/15/2016   Allergic urticaria 03/27/2016   Dermographia 03/27/2016   History of breast cancer 03/27/2016   Rheumatoid arthritis ([place]) 06/24/2015   Diverticulitis of colon without hemorrhage 09/21/2014   Asthma with acute exacerbation 07/14/2014   Cough 07/06/2014   Gout of big toe 03/10/2013   Preventative health care 10/08/2012   OSA (obstructive sleep apnea) 04/23/2012   Diabetes mellitus, type 2 ([place]) 12/10/2011   Diarrhea 09/04/2011   Personal history of  colonic polyps - adenoma 08/14/2011   Fatigue 08/03/2011   Ear canal dryness 08/03/2011   Palpitations 09/21/2010   Morbid obesity ([place]) 09/21/2010   PAC (premature atrial contraction) 09/21/2010   ATTENTION DEFICIT DISORDER, INATTENTIVE TYPE 09/22/2009   Acute sinusitis 09/22/2009   VIRAL MENINGITIS, HX OF 04/29/2009   Insomnia 02/15/2009   Vitamin D deficiency 03/11/2008   Fibromyalgia 03/05/2008   HYPERPARATHYROIDISM, HX OF 08/06/2007   Allergic rhinitis 07/05/2007   Osteoarthritis 02/28/2007   Edema 11/01/2006   Depression with anxiety 06/25/2006   Essential hypertension 06/25/2006   Gastroesophageal reflux disease without esophagitis 06/25/2006     PCP: Mosie Lukes, MD  REFERRING PROVIDER: Mosie Lukes, MD  REFERRING DIAG: M54.41 (ICD-10-CM) - Right-sided low back pain with right-sided sciatica, unspecified chronicity  Rationale for Evaluation and Treatment: Rehabilitation  THERAPY DIAG:  Other low back pain  Muscle weakness (generalized)  Other abnormalities of gait and mobility   ONSET DATE: 09/21/21 - LUMBAR THREE-FOUR, LUMBAR FOUR-FIVE POSTERIOR LUMBAR INTERBODY FUSION   SUBJECTIVE:                                                                                                                                                                                           SUBJECTIVE STATEMENT: Pt reports that she loved the DN. This morning she felt 'electricity' in her R hip from the buttocks to anterior hip.   PERTINENT HISTORY:  Diabetes, Osteoarthritis, Anxiety, GERD, R shoulder replacement (10/2015), L TKA, Back surgery (4/23)  PAIN:  Are you having pain? Yes: NPRS scale: 2-3/10 Pain location: buttocks Pain description: discomfort Aggravating factors: moving in bed, bending  forward, toilet hygiene  Relieving factors: ibuprofen, Tylenol, ice  PRECAUTIONS: None  WEIGHT BEARING RESTRICTIONS: No  FALLS:  Has patient fallen in last 6 months?  No  LIVING ENVIRONMENT: Lives with: lives alone Lives in: House/apartment Stairs: No Has following equipment at home: Single point cane and elevated commode with bidet   OCCUPATION: PT - accounting, mostly deskwork  PLOF: Independent, Needs assistance with ADLs (difficulty with toilet hygiene), and Leisure: time with family.  PATIENT GOALS: "To be able to walk and be active again. To be able to clean myself after using the toilet. To be able to bend over an pick things up."   OBJECTIVE:   DIAGNOSTIC FINDINGS:  MRI Lumbar Spine w/o Contrast: 01/07/22  IMPRESSION: 1. Postsurgical changes from L3-L5 PLIF. Bone marrow signal changes within the spinous processes and bilateral lamina of L2, L3, and L4 raise the suspicion for osteomyelitis. 2. Previously described T2 hyperintense material along the right lateral and dorsal epidural space at the L2 and L2-3 levels is not seen on the current study. No IV contrast was administered on the current exam. No new epidural space abnormality is identified. 3. Similar degree of canal stenosis at L2-3.  CT Lumbar Spine w/o Contrast: 01/07/22 IMPRESSION: 1. No CT evidence for acute abnormality within the lumbar spine. 2. Postoperative changes from prior PLIF at L3-4 and L4-5. Mild subsidence at L3-4. No other hardware complication. 3. Underlying degenerative spondylosis and facet arthrosis, better appreciated and described on recent MRIs.  PATIENT SURVEYS:  Modified Oswestry:  19 / 50 = 38.0 %  FOTO: NT  SCREENING FOR RED FLAGS: Bowel or bladder incontinence: Yes: bladder (urge incontinence) Spinal tumors: No Cauda equina syndrome: No Compression fracture: No Abdominal aneurysm: No  COGNITION: Overall cognitive status: Within functional limits for tasks assessed     SENSATION: WFL Intermittent brief R LE radicular pain  MUSCLE LENGTH: Hamstrings: mild tight B Quads: mod tight B  POSTURE: No Significant postural  limitations  PALPATION: Lumbar paraspinals & glutes - muscle tension WNL w/o TTP  LUMBAR ROM:   AROM eval 04/26/22  Flexion <25% WNL- just weak coming back up  Extension 50% 25%  Right lateral flexion 25% WNL  Left lateral flexion 25% WNL  Right rotation WNL   Left rotation WNL    (Blank rows = not tested)  LOWER EXTREMITY ROM:     Grossly WFL but accessory trunk lean in standing   LOWER EXTREMITY MMT:    MMT Right eval Left eval Right 04/26/22 Left 04/26/22  Hip flexion 2+ 3+ 4- 4-  Hip extension 2+ 2+ 4 4  Hip abduction 4- _0 Hip adduction 2+ 3+ 4 4+  Hip internal rotation 4+ 4+    Hip external rotation 4+ 4+    Knee flexion 5 5    Knee extension 5 5    Ankle dorsiflexion 5 4    Ankle plantarflexion      Ankle inversion      Ankle eversion       (Blank rows = not tested)  LUMBAR SPECIAL TESTS:  Straight leg raise test: Negative   GAIT: Distance walked: 60 feet, back and forth from exam room Assistive device utilized: Single point cane Level of assistance: Modified independence Comments: increased lumbar extension  TODAY'S TREATMENT:  DATE:   05/10/22 THERAPEUTIC EXERCISE: to improve flexibility, strength and mobility.  Verbal and tactile cues throughout for technique. Rec Bike L3 x 6 min Seated trunk rotations 5x both sides Seated deadlift 5lb x 10 Supine Figure 4 stretch 2x30 sec bil Supine LTR (bil hip ER/IR) x 10 both sides  MANUAL THERAPY: To promote normalized muscle tension, improved flexibility, and reduced pain. Skilled palpation and monitoring of soft tissue during DN Trigger Point Dry-Needling (performed by Annie Paras, PT) Treatment instructions: Expect mild to moderate muscle soreness. Patient verbalized understanding of these instructions and education. Patient Consent Given: Yes Education handout provided:  Previously provided Muscles treated: L glute maximus/medius/minimus & piriformis Electrical stimulation performed: Yes Parameters: N/A Treatment response/outcome: Twitch Response Elicited and Palpable Increase in Muscle Length STM/DTM, manual TPR and pin & stretch to muscles addressed with DN   05/08/22 THERAPEUTIC EXERCISE: to improve flexibility, strength and mobility.  Verbal and tactile cues throughout for technique. Rec Bike L3 x 6 min STM + TrP release of R/L glute med/min  MANUAL THERAPY: To promote normalized muscle tension and reduced pain. Skilled palpation and monitoring of soft tissue during DN Trigger Point Dry-Needling  Treatment instructions: Expect mild to moderate muscle soreness. Patient verbalized understanding of these instructions and education. Patient Consent Given: Yes Education handout provided: Yes Muscles treated: R/L glute med/min Electrical stimulation performed: No Parameters: N/A Treatment response/outcome: Twitch Response Elicited and Palpable Increase in Muscle Length STM/DTM, manual TPR and pin & stretch to muscles addressed with DN   05/03/22 THERAPEUTIC EXERCISE: to improve flexibility, strength and mobility.  Verbal and tactile cues throughout for technique. Rec Bike L2 x 6 min Seated lumbar flexion stretch green yoga ball 2x30 sec Seated trunk extension blue TB 2x10 Seated row x 10 blue TB Bending over and picking up bean bag 4x - cues for wide BOS and hip hinge Prone hip extension x 10 BLE  Manual Therapy: STM to bil upper glutes and lumbar paraspinals    PATIENT EDUCATION:  Education details: HEP update, role of DN, and DN rational, procedure, outcomes, potential side effects, and recommended post-treatment exercises/activity Person educated: Patient Education method: Explanation, Demonstration, Verbal cues, and Handouts Education comprehension: verbalized understanding and returned demonstration  HOME EXERCISE PROGRAM: Access  Code: [email or web address] URL: [email or web address] Date: 04/17/2022 Prepared by: Almyra Free  Exercises - Hooklying Hamstring Stretch with Strap  - 2-3 x daily - 7 x weekly - 3 reps - 30 sec hold - Standing Back Extension at [place]  - 2-3 x daily - 7 x weekly - 2 sets - 15 reps - 3 sec hold - Tourist information centre manager with Chair and Counter Support  - 2-3 x daily - 7 x weekly - 3 reps - 30 sec hold - Supine Posterior Pelvic Tilt  - 1 x daily - 7 x weekly - 3 sets - 10 reps - Hooklying Clamshell with Resistance  - 1 x daily - 7 x weekly - 2 sets - 10 reps - Supine March with Resistance Band  - 1 x daily - 7 x weekly - 3 sets - 10 reps - Seated Hip Adduction Isometrics with Ball  - 1 x daily - 7 x weekly - 2 sets - 10 reps - 5 hold - Supine March with Alternating Leg Lifts  - 1 x daily - 7 x weekly - 3 sets - 10 reps - Supine Pelvic Tilt with Straight Leg Raise  - 1 x daily - 7 x weekly - 2 sets - 10  reps - Heel Toe Raises with Counter Support  - 1 x daily - 3-4 x weekly - 2 sets - 10 reps - 3 sec hold - Standing Hip Flexion with Resistance Loop  - 1 x daily - 3-4 x weekly - 2 sets - 10 reps - 3 sec hold - Hip Abduction with Resistance Loop  - 1 x daily - 3-4 x weekly - 2 sets - 10 reps - 3 sec hold - Hip Extension with Resistance Loop  - 1 x daily - 3-4 x weekly - 2 sets - 10 reps - 3 sec hold - Squat with Medicine [employer]  - 1 x daily - 7 x weekly - 2-3 sets - 5 reps - 5 sec  hold  ASSESSMENT:  CLINICAL IMPRESSION: Marysue noted an instance this morning of nerve pain. She reported improvement from DN last visit. We worked on hip mobility today to address tightness in her hips. She was more tight with more trigger points in the L glute med/min, however had complaints on both R/L side. Cues were needed with the seated dead lifts to stabilize spine and for breathing. Pt inquired about DN today, thus TPDN performed by Annie Paras, PT post session.   OBJECTIVE IMPAIRMENTS:  Abnormal gait,  decreased activity tolerance, decreased balance, decreased endurance, decreased mobility, difficulty walking, decreased ROM, decreased strength, hypomobility, impaired perceived functional ability, impaired flexibility, improper body mechanics, and pain.   ACTIVITY LIMITATIONS: carrying, lifting, bending, sitting, standing, squatting, stairs, bed mobility, continence, bathing, toileting, dressing, and locomotion level  PARTICIPATION LIMITATIONS: cleaning, laundry, shopping, community activity, occupation, and attending festivals and ball games with grandchildren  PERSONAL FACTORS: Past/current experiences, Time since onset of injury/illness/exacerbation, and 3+ comorbidities: Diabetes, Osteoarthritis, Anxiety, GERD, R shoulder replacement (10/2015), and L TKA are also affecting patient's functional outcome.   REHAB POTENTIAL: Good  CLINICAL DECISION MAKING: Evolving/moderate complexity  EVALUATION COMPLEXITY: Moderate   GOALS: Goals reviewed with patient? Yes  SHORT TERM GOALS: Target date: 04/24/2022  Pt will perform HEP independently to demonstrate understanding of activity. Baseline: Goal status: MET 04/10/22  2.  Pt will have a 25% reduction in pain during mobility to improve everyday activity. Baseline:  Goal status: MET 04/10/22, Pt reports 100% improvement.  3.  Pt will have a 25% increase in lumbar mobility to perform ADLs such as toileting and bathing. Baseline:  Goal status: MET- 04/26/22  4.  Pt will improve strength of proximal hip muscles to >/= 3/5 to increase ability to stand from a chair and walk.  Baseline:  Goal status: MET 04/26/22  LONG TERM GOALS: Target date: 05/22/22  Pt will perform HEP independently to demonstrate understanding of activity. Baseline:  Goal status: MET 05/08/22  2.  Pt will have 75% reduction in pain during mobility to improve everyday activity. Baseline:  Goal status: MET 04/10/22 Pt reports 100% improvement in pain.   3. Pt will  have a 75% increase in lumbar mobility to perform ADLs such as toileting and bathing. Baseline:  Goal status: IN PROGRESS  4.  Pt will improve strength of proximal hip muscles to >/=4-/5 to increase ability to stand from a chair and walk.  Baseline:  Goal status: IN PROGRESS  5.  Pt will improve modified Oswestry to 26% to show increase in functional activity. Baseline:  19 / 50 = 38.0 % Goal status: IN PROGRESS  6.  Pt will perform STS with modified independence using single point cane to demonstrate independence and ability to  stand from chair.  Baseline:  Goal status: MET- 05/03/22   PLAN:  PT FREQUENCY: 2x/week  PT DURATION: 8 weeks  PLANNED INTERVENTIONS: Therapeutic exercises, Therapeutic activity, Neuromuscular re-education, Balance training, Gait training, Patient/Family education, Self Care, Joint mobilization, Joint manipulation, Stair training, Dry Needling, Electrical stimulation, Spinal manipulation, Spinal mobilization, Cryotherapy, Moist heat, Manual therapy, and Re-evaluation.  PLAN FOR NEXT SESSION: emphasize spinal erector strength; MT & DN of glutes as needed; progress lumbopelvic mobility and core strengthening, progress proximal hip muscle strengthening, include rotation mobility for ease of self cleaning, gait, balance    Artist Pais, PTA 05/10/2022, 10:24 AM

## 2022-05-10 NOTE — Progress Notes (Signed)
Pharmacy Note  05/10/2022 Name: [name] MRN: [id number] DOB: 01-07-1956  Subjective: [name] is a 66 y.o. year old female who is a primary care patient of Mosie Lukes, MD. Clinical Pharmacist Practitioner referral was placed to assist with medication and diabetes management.    Engaged with patient face to face for initial visit today.  Patient previously was taking Mounjaro for about 4 months with great results in lowering blood glucose and helping with weight loss but she had to stop once she reached Medicare Coverage gap in March of 2023. She has only been taking metformin '500mg'$  twice a day.  Patient's BMI is 47. She has several other comorbid conditions that weight loss could improve - OSA, GERD, hypertension, chronic pain / arthalgias  Noted low adherence for telmisartan in early 2023 - improved in the last 4 to 6 months.    Objective: Review of patient status, including review of consultants reports, laboratory and other test data, was performed as part of comprehensive.  Lab Results  Component Value Date   CREATININE 0.88 04/03/2022   CREATININE 1.10 01/20/2022   CREATININE 1.01 01/10/2022    Lab Results  Component Value Date   HGBA1C 6.3 04/03/2022       Component Value Date/Time   CHOL 183 04/03/2022 1135   CHOL 151 07/20/2020 0850   TRIG 168.0 (H) 04/03/2022 1135   HDL 61.20 04/03/2022 1135   HDL 50 07/20/2020 0850   CHOLHDL 3 04/03/2022 1135   VLDL 33.6 04/03/2022 1135   LDLCALC 88 04/03/2022 1135   LDLCALC 81 07/20/2020 0850   LDLDIRECT 110.0 08/21/2016 1356     Clinical ASCVD: No  The 10-year ASCVD risk score (Arnett DK, et al., 2019) is: 14.6%   Values used to calculate the score:     Age: 42 years     Sex: Female     Is Non-Hispanic African American: No     Diabetic: Yes     Tobacco smoker: No     Systolic Blood Pressure: 160 mmHg     Is BP treated: Yes     HDL Cholesterol: 61.2 mg/dL     Total Cholesterol: 183  mg/dL    BP Readings from Last 3 Encounters:  05/04/22 122/84  04/03/22 136/82  03/22/22 130/76     Allergies  Allergen Reactions   Allopurinol Hives   Mucinex [Guaifenesin Er] Shortness Of Breath   Lyrica [Pregabalin] Swelling   Penicillins Hives and Other (See Comments)    Has patient had a PCN reaction causing immediate rash, facial/tongue/throat swelling, SOB or lightheadedness with hypotension: no Has patient had a PCN reaction causing severe rash involving mucus membranes or skin necrosis: no Has patient had a PCN reaction that required hospitalization no Has patient had a PCN reaction occurring within the last 10 years: no If all of the above answers are "NO", then may proceed with Cephalosporin use.    Prozac [Fluoxetine Hcl] Hives   Amitriptyline Other (See Comments)    "felt weird, fatigue, dizziness"   Lexapro [Escitalopram Oxalate]     Nausea and hypersalivation.     Medications Reviewed Today     Reviewed by Artist Pais, PTA (Physical Therapy Assistant) on 05/10/22 at [phone]  Med List Status: <None>   Medication Order Taking? Sig Documenting Provider Last Dose Status Informant  acetaminophen (TYLENOL) 650 MG CR tablet [id number] No Take 1,300 mg by mouth every 8 (eight) hours as needed for pain. [provider] Taking Active Self, Pharmacy Records  albuterol (VENTOLIN HFA) 108 (90 Base) MCG/ACT inhaler [id number] No Inhale 2 puffs into the lungs every 6 (six) hours as needed for wheezing or shortness of breath. Debbrah Alar, NP Taking Active   azithromycin (ZITHROMAX) 250 MG tablet [id number]  Take 1 tablet (250 mg total) by mouth daily. Rigoberto Noel, MD  Active   calcium carbonate (OSCAL) 1500 (600 Ca) MG TABS tablet [id number] No Take 1,500 mg by mouth 2 (two) times daily with a meal. [provider] Taking Active Self, Pharmacy Records  cholecalciferol (VITAMIN D3) 25 MCG (1000 UNIT) tablet [id number] No Take 1,000 Units by mouth daily.  [provider] Taking Active Self, Pharmacy Records  Cyanocobalamin (B-12) 1000 MCG CAPS [id number] No Take 1 capsule by mouth daily at 6 (six) AM. Debbrah Alar, NP Taking Active Self, Pharmacy Records  famotidine (PEPCID) 40 MG tablet [id number] No Take 1 tablet (40 mg total) by mouth daily. Mosie Lukes, MD Taking Active Self, Pharmacy Records  furosemide (LASIX) 40 MG tablet [id number] No TAKE 1 TABLET BY MOUTH  TWICE DAILY  Patient taking differently: Take 40 mg by mouth daily.   Mosie Lukes, MD Taking Active Self, Pharmacy Records  hydrochlorothiazide (MICROZIDE) 12.5 MG capsule [id number] No TAKE 1 CAPSULE BY MOUTH DAILY  Patient taking differently: Take 12.5 mg by mouth daily.   Mosie Lukes, MD Taking Active Self, Pharmacy Records  HYDROcodone bit-homatropine (HYDROMET) 5-1.5 MG/5ML syrup [id number] No Take 5 mLs by mouth every 6 (six) hours as needed for cough. Mosie Lukes, MD Taking Active   Iron, Ferrous Sulfate, 325 (65 Fe) MG TABS [id number] No Take 325 mg by mouth every other day.  Patient taking differently: Take 325 mg by mouth daily.   Debbrah Alar, NP Taking Active Self, Pharmacy Records  metFORMIN (GLUCOPHAGE) 500 MG tablet [id number] No TAKE 1 TABLET BY MOUTH  TWICE DAILY WITH A MEAL  Patient taking differently: Take 500 mg by mouth 2 (two) times daily with a meal.   Mosie Lukes, MD Taking Active Self, Pharmacy Records  methylPREDNISolone (MEDROL) 4 MG tablet [id number] No 5 tabs po x 1 day then 4 tabs po x 1 day then 3 tabs po x 1 day then 2 tabs po x 1 day then 1 tab po x 1 day and stop Mosie Lukes, MD Taking Active   Multiple Vitamins-Minerals (MULTIVITAMIN & MINERAL PO) [id number] No Take 1 tablet by mouth daily. [provider] Taking Active Self, Pharmacy Records  telmisartan (MICARDIS) 20 MG tablet [id number] No TAKE 1 TABLET BY MOUTH DAILY  Patient taking differently: Take 20 mg by mouth daily.   Mosie Lukes, MD Taking  Active Self, Pharmacy Records            Patient Active Problem List   Diagnosis Date Noted   Elevated sed rate 04/03/2022   Viral bronchitis 03/22/2022   Low back pain 01/11/2022   Drainage from wound 10/07/2021   Spondylolisthesis of lumbar region 09/21/2021   DDD (degenerative disc disease), cervical 05/04/2021   Neck pain 05/02/2021   Left arm pain 01/21/2021   Memory changes 04/19/2020   Educated about COVID-19 virus infection 04/19/2020   Peripheral neuropathy 04/19/2020   TMJ arthralgia 04/19/2020   Urinary incontinence 10/27/2019   Other peripheral vertigo, unspecified ear 10/27/2019   Vertigo 03/14/2018   RLS (restless legs syndrome) 06/07/2017   Right knee pain 12/06/2016   Arthralgia 12/03/2016   Hyperlipidemia  08/21/2016   Anemia 07/17/2016   Genetic testing 05/15/2016   Allergic urticaria 03/27/2016   Dermographia 03/27/2016   History of breast cancer 03/27/2016   Rheumatoid arthritis ([place]) 06/24/2015   Diverticulitis of colon without hemorrhage 09/21/2014   Asthma with acute exacerbation 07/14/2014   Cough 07/06/2014   Gout of big toe 03/10/2013   Preventative health care 10/08/2012   OSA (obstructive sleep apnea) 04/23/2012   Diabetes mellitus, type 2 ([place]) 12/10/2011   Diarrhea 09/04/2011   Personal history of colonic polyps - adenoma 08/14/2011   Fatigue 08/03/2011   Ear canal dryness 08/03/2011   Palpitations 09/21/2010   Morbid obesity ([place]) 09/21/2010   PAC (premature atrial contraction) 09/21/2010   ATTENTION DEFICIT DISORDER, INATTENTIVE TYPE 09/22/2009   Acute sinusitis 09/22/2009   VIRAL MENINGITIS, HX OF 04/29/2009   Insomnia 02/15/2009   Vitamin D deficiency 03/11/2008   Fibromyalgia 03/05/2008   HYPERPARATHYROIDISM, HX OF 08/06/2007   Allergic rhinitis 07/05/2007   Osteoarthritis 02/28/2007   Edema 11/01/2006   Depression with anxiety 06/25/2006   Essential hypertension 06/25/2006   Gastroesophageal reflux disease without  esophagitis 06/25/2006     Medication Assistance:  Application for Ozempic  medication assistance program. in process.  Anticipated assistance start date 05/29/2022.  See plan of care for additional detail.   Assessment / Plan: Type 2 DM / Obestiy Start Ozempic 0.'25mg'$  SQ weekly for 4 weeks, then increase to 0.'5mg'$  weekly thereafter.  Patient signed application for Ozempic patient assistance program today - Will have PCP sign if approved and then fax to patient assistance program.    Medication management:  Reviewed and updated medication list Reviewed refill history and adherence Discus importance of telmisartan in blood pressure control and renal protection  Follow Up:  Telephone follow up appointment with care management team member scheduled for:  3 to 4 weeks.   Cherre Robins, PharmD Clinical Pharmacist [facility] [place] [phone]

## 2022-05-15 ENCOUNTER — Ambulatory Visit: Payer: Medicare Other

## 2022-05-17 ENCOUNTER — Ambulatory Visit: Payer: Medicare Other | Admitting: Physical Therapy

## 2022-05-17 ENCOUNTER — Encounter: Payer: Self-pay | Admitting: Physical Therapy

## 2022-05-17 DIAGNOSIS — M6281 Muscle weakness (generalized): Secondary | ICD-10-CM | POA: Diagnosis not present

## 2022-05-17 DIAGNOSIS — M5459 Other low back pain: Secondary | ICD-10-CM | POA: Diagnosis not present

## 2022-05-17 DIAGNOSIS — R2689 Other abnormalities of gait and mobility: Secondary | ICD-10-CM

## 2022-05-17 NOTE — Therapy (Addendum)
OUTPATIENT PHYSICAL THERAPY TREATMENT / RE-CERTIFICATION  Progress Note  Reporting Period 05/08/2022 to 05/17/2022  See note below for Objective Data and Assessment of Progress/Goals.    Patient Name: [name] MRN: [id number] DOB:[birth year], 66 y.o., female Today's Date: 03/24/2022   PT End of Session - 05/17/22 0931     Visit Number 12    Date for PT Re-Evaluation 06/21/22    Authorization Type [employer] Medicare    PT Start Time [phone]    PT Stop Time 1020    PT Time Calculation (min) 49 min    Activity Tolerance Patient tolerated treatment well    Behavior During Therapy [facility] for tasks assessed/performed                      Past Medical History:  Diagnosis Date   Allergic rhinitis    Allergy    Anemia 07/17/2016   Anxiety    Asthma    seasonal   Blood transfusion without reported diagnosis    Breast cancer ([place])    right   Bronchitis    Colon polyps    CPAP (continuous positive airway pressure) dependence    Depression    Diabetes mellitus    borderline but takes metformin   Edema    Family history of breast cancer in first degree relative    Fibromyalgia    GERD (gastroesophageal reflux disease)    Hyperglycemia 03/05/2017   Hyperlipidemia 08/21/2016   no meds   Hyperparathyroidism    Hypertension    controlled by medications   Joint pain    Neuromuscular disorder ([place])    Fibromyalgia   Obesity    Osteoarthritis    RA   PONV (postoperative nausea and vomiting)    occasional   Pre-diabetes    Sleep apnea    wears cpap   Viral meningitis    Vitamin D deficiency     Past Surgical History:  Procedure Laterality Date   [place] SURGERY     09/21/21   BREAST EXCISIONAL BIOPSY Left 1993   benign   BREAST SURGERY     Tran flap due to breast cancer   [place]   COLONOSCOPY  08/14/2011   HAND SURGERY     dog bite, right hand   HAND SURGERY     trauma, left hand    KNEE ARTHROSCOPY     bilateral   left knee replacement  2010   LUMBAR WOUND DEBRIDEMENT N/A 10/07/2021   Procedure: LUMBAR WOUND DEBRIDEMENT/REVISION;  Surgeon: Ashok Pall, MD;  Location: [place];  Service: Neurosurgery;  Laterality: N/A;  3C/RM 21 to follow Dr Kathyrn Sheriff   MASTECTOMY  01/13/1994   Right breast   parathyroid resection     PARATHYROIDECTOMY     POLYPECTOMY     rectal abscess     SEPTOPLASTY  1980   SHOULDER ARTHROSCOPY Right 11/09/2015   Procedure: ARTHROSCOPY SHOULDER-acromioplasty, distal clavicle resection and debridement;  Surgeon: Melrose Nakayama, MD;  Location: [place];  Service: Orthopedics;  Laterality: Right;   shoulder arthroscopy     rotator cuff repair   TOE SURGERY Right    paronychia and adenoma removed   TONSILLECTOMY     TOTAL KNEE ARTHROPLASTY  06/18/2008   [place]   TUMOR REMOVAL  1982   , scalp    Patient Active Problem List   Diagnosis Date Noted   Elevated sed  rate 04/03/2022   Viral bronchitis 03/22/2022   Low back pain 01/11/2022   Drainage from wound 10/07/2021   Spondylolisthesis of lumbar region 09/21/2021   DDD (degenerative disc disease), cervical 05/04/2021   Neck pain 05/02/2021   Left arm pain 01/21/2021   Memory changes 04/19/2020   Educated about COVID-19 virus infection 04/19/2020   Peripheral neuropathy 04/19/2020   TMJ arthralgia 04/19/2020   Urinary incontinence 10/27/2019   Other peripheral vertigo, unspecified ear 10/27/2019   Vertigo 03/14/2018   RLS (restless legs syndrome) 06/07/2017   Right knee pain 12/06/2016   Arthralgia 12/03/2016   Hyperlipidemia 08/21/2016   Anemia 07/17/2016   Genetic testing 05/15/2016   Allergic urticaria 03/27/2016   Dermographia 03/27/2016   History of breast cancer 03/27/2016   Rheumatoid arthritis ([place]) 06/24/2015   Diverticulitis of colon without hemorrhage 09/21/2014   Asthma with acute exacerbation 07/14/2014   Cough 07/06/2014   Gout of big toe 03/10/2013   Preventative  health care 10/08/2012   OSA (obstructive sleep apnea) 04/23/2012   Diabetes mellitus, type 2 ([place]) 12/10/2011   Diarrhea 09/04/2011   Personal history of colonic polyps - adenoma 08/14/2011   Fatigue 08/03/2011   Ear canal dryness 08/03/2011   Palpitations 09/21/2010   Morbid obesity ([place]) 09/21/2010   PAC (premature atrial contraction) 09/21/2010   ATTENTION DEFICIT DISORDER, INATTENTIVE TYPE 09/22/2009   Acute sinusitis 09/22/2009   VIRAL MENINGITIS, HX OF 04/29/2009   Insomnia 02/15/2009   Vitamin D deficiency 03/11/2008   Fibromyalgia 03/05/2008   HYPERPARATHYROIDISM, HX OF 08/06/2007   Allergic rhinitis 07/05/2007   Osteoarthritis 02/28/2007   Edema 11/01/2006   Depression with anxiety 06/25/2006   Essential hypertension 06/25/2006   Gastroesophageal reflux disease without esophagitis 06/25/2006     PCP: Mosie Lukes, MD  REFERRING PROVIDER: Mosie Lukes, MD  REFERRING DIAG: M54.41 (ICD-10-CM) - Right-sided low back pain with right-sided sciatica, unspecified chronicity  RATIONALE FOR EVALUATION AND TREATMENT: Rehabilitation  THERAPY DIAG:  Other low back pain  Muscle weakness (generalized)  Other abnormalities of gait and mobility   ONSET DATE: 09/21/21 - LUMBAR THREE-FOUR, LUMBAR FOUR-FIVE POSTERIOR LUMBAR INTERBODY FUSION   SUBJECTIVE:                                                                                                                                                                                           SUBJECTIVE STATEMENT: Pt feels that PT has really helped but still will have pain when returning to standing after bending over.   PAIN:  Are you having pain? No and Yes: NPRS scale: 0/10 currently, when standing back up from bending  over up to 5/10 Pain location: L>R low back Pain description: "weakness" Aggravating factors: returning to standing after bending over Relieving factors: DN  PERTINENT HISTORY:  Diabetes,  Osteoarthritis, Anxiety, GERD, R shoulder replacement (10/2015), L TKA, Back surgery (4/23)  PRECAUTIONS: None  WEIGHT BEARING RESTRICTIONS: No  FALLS:  Has patient fallen in last 6 months? No  LIVING ENVIRONMENT: Lives with: lives alone Lives in: House/apartment Stairs: No Has following equipment at home: Single point cane and elevated commode with bidet   OCCUPATION: PT - accounting, mostly deskwork  PLOF: Independent, Needs assistance with ADLs (difficulty with toilet hygiene), and Leisure: time with family.  PATIENT GOALS: "To be able to walk and be active again. To be able to clean myself after using the toilet. To be able to bend over an pick things up."   OBJECTIVE:   DIAGNOSTIC FINDINGS:  MRI Lumbar Spine w/o Contrast: 01/07/22  IMPRESSION: 1. Postsurgical changes from L3-L5 PLIF. Bone marrow signal changes within the spinous processes and bilateral lamina of L2, L3, and L4 raise the suspicion for osteomyelitis. 2. Previously described T2 hyperintense material along the right lateral and dorsal epidural space at the L2 and L2-3 levels is not seen on the current study. No IV contrast was administered on the current exam. No new epidural space abnormality is identified. 3. Similar degree of canal stenosis at L2-3.  CT Lumbar Spine w/o Contrast: 01/07/22 IMPRESSION: 1. No CT evidence for acute abnormality within the lumbar spine. 2. Postoperative changes from prior PLIF at L3-4 and L4-5. Mild subsidence at L3-4. No other hardware complication. 3. Underlying degenerative spondylosis and facet arthrosis, better appreciated and described on recent MRIs.  PATIENT SURVEYS:  Modified Oswestry:  19 / 50 = 38.0 %  FOTO: NT  SCREENING FOR RED FLAGS: Bowel or bladder incontinence: Yes: bladder (urge incontinence) Spinal tumors: No Cauda equina syndrome: No Compression fracture: No Abdominal aneurysm: No  COGNITION: Overall cognitive status: Within functional limits  for tasks assessed     SENSATION: WFL Intermittent brief R LE radicular pain  MUSCLE LENGTH: Hamstrings: mild tight B Quads: mod tight B  POSTURE: No Significant postural limitations  PALPATION: Lumbar paraspinals & glutes - muscle tension WNL w/o TTP  LUMBAR ROM:   AROM eval 04/26/22 05/17/22  Flexion <25% WNL- just weak coming back up [facility] - still has to walk hands back up legs  Extension 50% 25% WNL  Right lateral flexion 25% WNL WFL  Left lateral flexion 25% WNL WFL  Right rotation WNL  WNL  Left rotation WNL  WNL   (Blank rows = not tested)  LOWER EXTREMITY ROM:     Grossly WFL but accessory trunk lean in standing   LOWER EXTREMITY MMT:    MMT Right eval Left eval Right 04/26/22 Left 04/26/22 Right 05/17/22 Left 05/17/22  Hip flexion 2+ 3+ 4- 4- 4 4  Hip extension 2+ 2+ 4 4 4+ 4+  Hip abduction 4- _0 4- 4  Hip adduction 2+ 3+ 4 4+ 4 4+  Hip internal rotation 4+ 4+   5 5  Hip external rotation 4+ 4+   5 4+  Knee flexion _1 Knee extension _2 Ankle dorsiflexion _3 4+  Ankle plantarflexion        Ankle inversion        Ankle eversion         (Blank rows = not  tested)  LUMBAR SPECIAL TESTS:  Straight leg raise test: Negative   GAIT: Distance walked: 60 feet, back and forth from exam room Assistive device utilized: Single point cane Level of assistance: Modified independence Comments: increased lumbar extension  TODAY'S TREATMENT:                                                                                                                              DATE:   05/17/22 THERAPEUTIC EXERCISE: to improve flexibility, strength and mobility.  Verbal and tactile cues throughout for technique.  NuStep - L5 x 6 min Attempted hip hinge with pole behind back but deferred due to complaints of increased low back pain Forward T with upper extremity support on elevated Hi-Lo table x 10  THERAPEUTIC ACTIVITIES: ROM MMT Goal  assessment Modified Oswestry: 9 / 50 = 18.0% Provided instruction in self-STM techniques to glutes using tennis ball or foam roller on wall.  MANUAL THERAPY: To promote normalized muscle tension, improved flexibility, and reduced pain. Skilled palpation and monitoring of soft tissue during DN Trigger Point Dry-Needling  Treatment instructions: Expect mild to moderate muscle soreness. S/S of pneumothorax if dry needled over a lung field, and to seek immediate medical attention should they occur. Patient verbalized understanding of these instructions and education. Patient Consent Given: Yes Education handout provided: Previously provided Muscles treated: B glute maximus/medius/minimus Electrical stimulation performed: No Parameters: N/A Treatment response/outcome: Twitch Response Elicited and Palpable Increase in Muscle Length STM/DTM, manual TPR and pin & stretch to muscles addressed with DN   05/10/22 THERAPEUTIC EXERCISE: to improve flexibility, strength and mobility.  Verbal and tactile cues throughout for technique. Rec Bike L3 x 6 min Seated trunk rotations 5x both sides Seated deadlift 5lb x 10 Supine Figure 4 stretch 2x30 sec bil Supine LTR (bil hip ER/IR) x 10 both sides  MANUAL THERAPY: To promote normalized muscle tension, improved flexibility, and reduced pain. Skilled palpation and monitoring of soft tissue during DN Trigger Point Dry-Needling (performed by Annie Paras, PT) Treatment instructions: Expect mild to moderate muscle soreness. Patient verbalized understanding of these instructions and education. Patient Consent Given: Yes Education handout provided: Previously provided Muscles treated: L glute maximus/medius/minimus & piriformis Electrical stimulation performed: Yes Parameters: N/A Treatment response/outcome: Twitch Response Elicited and Palpable Increase in Muscle Length STM/DTM, manual TPR and pin & stretch to muscles addressed with  DN   05/08/22 THERAPEUTIC EXERCISE: to improve flexibility, strength and mobility.  Verbal and tactile cues throughout for technique. Rec Bike L3 x 6 min STM + TrP release of R/L glute med/min  MANUAL THERAPY: To promote normalized muscle tension and reduced pain. Skilled palpation and monitoring of soft tissue during DN Trigger Point Dry-Needling  Treatment instructions: Expect mild to moderate muscle soreness. Patient verbalized understanding of these instructions and education. Patient Consent Given: Yes Education handout provided: Yes Muscles treated: R/L glute med/min Electrical stimulation performed: No Parameters: N/A Treatment response/outcome: Twitch Response Elicited and Palpable  Increase in Muscle Length STM/DTM, manual TPR and pin & stretch to muscles addressed with DN   PATIENT EDUCATION:  Education details: progress with PT, ongoing PT POC, self-STM techniques to glutes using tennis ball or foam roller on wall, role of DN, and DN rational, procedure, outcomes, potential side effects, and recommended post-treatment exercises/activity Person educated: Patient Education method: Explanation and Demonstration Education comprehension: verbalized understanding  HOME EXERCISE PROGRAM: Access Code: [email or web address] URL: [email or web address] Date: 04/17/2022 Prepared by: Almyra Free  Exercises - Hooklying Hamstring Stretch with Strap  - 2-3 x daily - 7 x weekly - 3 reps - 30 sec hold - Standing Back Extension at [place]  - 2-3 x daily - 7 x weekly - 2 sets - 15 reps - 3 sec hold - Tourist information centre manager with Chair and Counter Support  - 2-3 x daily - 7 x weekly - 3 reps - 30 sec hold - Supine Posterior Pelvic Tilt  - 1 x daily - 7 x weekly - 3 sets - 10 reps - Hooklying Clamshell with Resistance  - 1 x daily - 7 x weekly - 2 sets - 10 reps - Supine March with Resistance Band  - 1 x daily - 7 x weekly - 3 sets - 10 reps - Seated Hip Adduction Isometrics with Ball  - 1 x daily - 7  x weekly - 2 sets - 10 reps - 5 hold - Supine March with Alternating Leg Lifts  - 1 x daily - 7 x weekly - 3 sets - 10 reps - Supine Pelvic Tilt with Straight Leg Raise  - 1 x daily - 7 x weekly - 2 sets - 10 reps - Heel Toe Raises with Counter Support  - 1 x daily - 3-4 x weekly - 2 sets - 10 reps - 3 sec hold - Standing Hip Flexion with Resistance Loop  - 1 x daily - 3-4 x weekly - 2 sets - 10 reps - 3 sec hold - Hip Abduction with Resistance Loop  - 1 x daily - 3-4 x weekly - 2 sets - 10 reps - 3 sec hold - Hip Extension with Resistance Loop  - 1 x daily - 3-4 x weekly - 2 sets - 10 reps - 3 sec hold - Squat with Medicine [employer]  - 1 x daily - 7 x weekly - 2-3 sets - 5 reps - 5 sec  hold  ASSESSMENT:  CLINICAL IMPRESSION: [name] reports considerable improvement with physical therapy with 90% improvement in low back pain and functional activity tolerance noted.  Lumbar range of motion now essentially WFL/WNL in all planes but continued limitations still noted with return to's extension after forward flexion with patient still needing to walk her hands up her legs.  Overall LE strength improving but continued weakness still evident in lumbar paraspinals/erector spinae muscles and hip extensors contributing to difficulty with returning to standing after bending forward. [name] continues to experience increased muscle tension and tenderness to palpation in the upper glutes bilaterally that has responded positively to DN.  Education provided and options for self soft tissue mobilization to address ongoing abnormal muscle tension and pain. [name] feels that she would continue to benefit from skilled physical therapy and given ongoing strength and functional deficits, will recommend recert for 2x/wk x up to 4-5 weeks to address remaining deficits.  OBJECTIVE IMPAIRMENTS:  Abnormal gait, decreased activity tolerance, decreased balance, decreased endurance, decreased mobility, difficulty  walking, decreased ROM, decreased strength, hypomobility, impaired perceived  functional ability, impaired flexibility, improper body mechanics, and pain.   ACTIVITY LIMITATIONS: carrying, lifting, bending, sitting, standing, squatting, stairs, bed mobility, continence, bathing, toileting, dressing, and locomotion level  PARTICIPATION LIMITATIONS: cleaning, laundry, shopping, community activity, occupation, and attending festivals and ball games with grandchildren  PERSONAL FACTORS: Past/current experiences, Time since onset of injury/illness/exacerbation, and 3+ comorbidities: Diabetes, Osteoarthritis, Anxiety, GERD, R shoulder replacement (10/2015), and L TKA are also affecting patient's functional outcome.   REHAB POTENTIAL: Good  CLINICAL DECISION MAKING: Evolving/moderate complexity  EVALUATION COMPLEXITY: Moderate   GOALS: Goals reviewed with patient? Yes  SHORT TERM GOALS: Target date: 04/24/2022  Pt will perform HEP independently to demonstrate understanding of activity. Baseline: Goal status: MET 04/10/22  2.  Pt will have a 25% reduction in pain during mobility to improve everyday activity. Baseline:  Goal status: MET 04/10/22 - Pt reports 100% improvement.  3.  Pt will have a 25% increase in lumbar mobility to perform ADLs such as toileting and bathing. Baseline:  Goal status: MET 04/26/22  4.  Pt will improve strength of proximal hip muscles to >/= 3/5 to increase ability to stand from a chair and walk.  Baseline:  Goal status: MET 04/26/22  LONG TERM GOALS: Target date: 05/22/22, extended 06/21/2022  Pt will perform HEP independently to demonstrate understanding of activity. Baseline:  Goal status: PARTIALLY MET 05/17/22 - Met for current HEP  2.  Pt will have 75% reduction in pain during mobility to improve everyday activity. Baseline:  Goal status: MET 05/17/22 - Pt reports 90% improvement in pain.   3. Pt will have a 75% increase in lumbar mobility to  perform ADLs such as toileting and bathing. Baseline:  Goal status: PARTIALLY MET 05/17/22 - Pt reports 90% improvement with bathing but toileting remains dependent on height of toilet  4.  Pt will improve strength of proximal hip muscles to >/= 4+/5 to increase ability to stand from a chair and walk.  Baseline:  Goal status: REVISED 05/17/22  5.  Pt will improve modified Oswestry to 26% to show increase in functional activity. Baseline:  19 / 50 = 38.0 % Goal status: MET  05/17/22 - modified Oswestry = 9 / 50 = 18.0%  6.  Pt will perform STS with modified independence using single point cane to demonstrate independence and ability to stand from chair.  Baseline:  Goal status: MET  05/03/22  7.  Patient will be able to return to standing from forward lumbar flexion without increased pain or need to walk hands up legs. Baseline:  Goal status: INITIAL    PLAN:  PT FREQUENCY: 2x/week  PT DURATION: 4-5 weeks  PLANNED INTERVENTIONS: Therapeutic exercises, Therapeutic activity, Neuromuscular re-education, Balance training, Gait training, Patient/Family education, Self Care, Joint mobilization, Joint manipulation, Stair training, Dry Needling, Electrical stimulation, Spinal manipulation, Spinal mobilization, Cryotherapy, Moist heat, Manual therapy, and Re-evaluation.  PLAN FOR NEXT SESSION: progress lumbopelvic mobility and core/proximal hip muscle strengthening - emphasize spinal erector and glute strength and include rotation mobility for ease of self cleaning; MT & DN of glutes as needed; gait; balance    Percival Spanish, PT 05/17/2022, 2:18 PM

## 2022-06-01 ENCOUNTER — Telehealth: Payer: Self-pay | Admitting: Pharmacist

## 2022-06-01 ENCOUNTER — Ambulatory Visit: Payer: Medicare Other | Attending: Family Medicine

## 2022-06-01 DIAGNOSIS — R2689 Other abnormalities of gait and mobility: Secondary | ICD-10-CM

## 2022-06-01 DIAGNOSIS — M6281 Muscle weakness (generalized): Secondary | ICD-10-CM

## 2022-06-01 DIAGNOSIS — M5459 Other low back pain: Secondary | ICD-10-CM

## 2022-06-01 NOTE — Therapy (Signed)
OUTPATIENT PHYSICAL THERAPY TREATMENT    Patient Name: [name] MRN: [id number] DOB:07/17/1955, 67 y.o., female Today's Date: 03/24/2022   PT End of Session - 06/01/22 0924     Visit Number 13    Date for PT Re-Evaluation 06/21/22    Authorization Type [employer] Medicare    Progress Note Due on Visit 45    PT Start Time 0851    PT Stop Time 0931    PT Time Calculation (min) 40 min    Activity Tolerance Patient tolerated treatment well    Behavior During Therapy [facility] for tasks assessed/performed                       Past Medical History:  Diagnosis Date   Allergic rhinitis    Allergy    Anemia 07/17/2016   Anxiety    Asthma    seasonal   Blood transfusion without reported diagnosis    Breast cancer ([place])    right   Bronchitis    Colon polyps    CPAP (continuous positive airway pressure) dependence    Depression    Diabetes mellitus    borderline but takes metformin   Edema    Family history of breast cancer in first degree relative    Fibromyalgia    GERD (gastroesophageal reflux disease)    Hyperglycemia 03/05/2017   Hyperlipidemia 08/21/2016   no meds   Hyperparathyroidism    Hypertension    controlled by medications   Joint pain    Neuromuscular disorder ([place])    Fibromyalgia   Obesity    Osteoarthritis    RA   PONV (postoperative nausea and vomiting)    occasional   Pre-diabetes    Sleep apnea    wears cpap   Viral meningitis    Vitamin D deficiency     Past Surgical History:  Procedure Laterality Date   [place] SURGERY     09/21/21   BREAST EXCISIONAL BIOPSY Left 1993   benign   BREAST SURGERY     Tran flap due to breast cancer   [place]   COLONOSCOPY  08/14/2011   HAND SURGERY     dog bite, right hand   HAND SURGERY     trauma, left hand   KNEE ARTHROSCOPY     bilateral   left knee replacement  2010   LUMBAR WOUND DEBRIDEMENT N/A 10/07/2021   Procedure:  LUMBAR WOUND DEBRIDEMENT/REVISION;  Surgeon: Ashok Pall, MD;  Location: [facility];  Service: Neurosurgery;  Laterality: N/A;  3C/RM 21 to follow Dr Kathyrn Sheriff   MASTECTOMY  01/13/1994   Right breast   parathyroid resection     PARATHYROIDECTOMY     POLYPECTOMY     rectal abscess     SEPTOPLASTY  1980   SHOULDER ARTHROSCOPY Right 11/09/2015   Procedure: ARTHROSCOPY SHOULDER-acromioplasty, distal clavicle resection and debridement;  Surgeon: Melrose Nakayama, MD;  Location: [facility];  Service: Orthopedics;  Laterality: Right;   shoulder arthroscopy     rotator cuff repair   TOE SURGERY Right    paronychia and adenoma removed   TONSILLECTOMY     TOTAL KNEE ARTHROPLASTY  06/18/2008   [place]   TUMOR REMOVAL  1982   , scalp    Patient Active Problem List   Diagnosis Date Noted   Elevated sed rate 04/03/2022   Viral bronchitis 03/22/2022   Low back pain  01/11/2022   Drainage from wound 10/07/2021   Spondylolisthesis of lumbar region 09/21/2021   DDD (degenerative disc disease), cervical 05/04/2021   Neck pain 05/02/2021   Left arm pain 01/21/2021   Memory changes 04/19/2020   Educated about COVID-19 virus infection 04/19/2020   Peripheral neuropathy 04/19/2020   TMJ arthralgia 04/19/2020   Urinary incontinence 10/27/2019   Other peripheral vertigo, unspecified ear 10/27/2019   Vertigo 03/14/2018   RLS (restless legs syndrome) 06/07/2017   Right knee pain 12/06/2016   Arthralgia 12/03/2016   Hyperlipidemia 08/21/2016   Anemia 07/17/2016   Genetic testing 05/15/2016   Allergic urticaria 03/27/2016   Dermographia 03/27/2016   History of breast cancer 03/27/2016   Rheumatoid arthritis ([place]) 06/24/2015   Diverticulitis of colon without hemorrhage 09/21/2014   Asthma with acute exacerbation 07/14/2014   Cough 07/06/2014   Gout of big toe 03/10/2013   Preventative health care 10/08/2012   OSA (obstructive sleep apnea) 04/23/2012   Diabetes mellitus, type 2 ([place]) 12/10/2011    Diarrhea 09/04/2011   Personal history of colonic polyps - adenoma 08/14/2011   Fatigue 08/03/2011   Ear canal dryness 08/03/2011   Palpitations 09/21/2010   Morbid obesity ([place]) 09/21/2010   PAC (premature atrial contraction) 09/21/2010   ATTENTION DEFICIT DISORDER, INATTENTIVE TYPE 09/22/2009   Acute sinusitis 09/22/2009   VIRAL MENINGITIS, HX OF 04/29/2009   Insomnia 02/15/2009   Vitamin D deficiency 03/11/2008   Fibromyalgia 03/05/2008   HYPERPARATHYROIDISM, HX OF 08/06/2007   Allergic rhinitis 07/05/2007   Osteoarthritis 02/28/2007   Edema 11/01/2006   Depression with anxiety 06/25/2006   Essential hypertension 06/25/2006   Gastroesophageal reflux disease without esophagitis 06/25/2006     PCP: Mosie Lukes, MD  REFERRING PROVIDER: Mosie Lukes, MD  REFERRING DIAG: M54.41 (ICD-10-CM) - Right-sided low back pain with right-sided sciatica, unspecified chronicity  RATIONALE FOR EVALUATION AND TREATMENT: Rehabilitation  THERAPY DIAG:  Other low back pain  Muscle weakness (generalized)  Other abnormalities of gait and mobility   ONSET DATE: 09/21/21 - LUMBAR THREE-FOUR, LUMBAR FOUR-FIVE POSTERIOR LUMBAR INTERBODY FUSION   SUBJECTIVE:                                                                                                                                                                                           SUBJECTIVE STATEMENT: Been doing better, able to bend down and pick things up now although having to take breaks afterwards d/t fatigue.  PAIN:  Are you having pain? No and Yes: NPRS scale: 0/10 Pain location: L>R low back Pain description: "weakness" Aggravating factors: returning to standing after bending over Relieving factors: DN  PERTINENT HISTORY:  Diabetes, Osteoarthritis, Anxiety, GERD, R shoulder replacement (10/2015), L TKA, Back surgery (4/23)  PRECAUTIONS: None  WEIGHT BEARING RESTRICTIONS: No  FALLS:  Has patient fallen in  last 6 months? No  LIVING ENVIRONMENT: Lives with: lives alone Lives in: House/apartment Stairs: No Has following equipment at home: Single point cane and elevated commode with bidet   OCCUPATION: PT - accounting, mostly deskwork  PLOF: Independent, Needs assistance with ADLs (difficulty with toilet hygiene), and Leisure: time with family.  PATIENT GOALS: "To be able to walk and be active again. To be able to clean myself after using the toilet. To be able to bend over an pick things up."   OBJECTIVE:   DIAGNOSTIC FINDINGS:  MRI Lumbar Spine w/o Contrast: 01/07/22  IMPRESSION: 1. Postsurgical changes from L3-L5 PLIF. Bone marrow signal changes within the spinous processes and bilateral lamina of L2, L3, and L4 raise the suspicion for osteomyelitis. 2. Previously described T2 hyperintense material along the right lateral and dorsal epidural space at the L2 and L2-3 levels is not seen on the current study. No IV contrast was administered on the current exam. No new epidural space abnormality is identified. 3. Similar degree of canal stenosis at L2-3.  CT Lumbar Spine w/o Contrast: 01/07/22 IMPRESSION: 1. No CT evidence for acute abnormality within the lumbar spine. 2. Postoperative changes from prior PLIF at L3-4 and L4-5. Mild subsidence at L3-4. No other hardware complication. 3. Underlying degenerative spondylosis and facet arthrosis, better appreciated and described on recent MRIs.  PATIENT SURVEYS:  Modified Oswestry:  19 / 50 = 38.0 %  FOTO: NT  SCREENING FOR RED FLAGS: Bowel or bladder incontinence: Yes: bladder (urge incontinence) Spinal tumors: No Cauda equina syndrome: No Compression fracture: No Abdominal aneurysm: No  COGNITION: Overall cognitive status: Within functional limits for tasks assessed     SENSATION: WFL Intermittent brief R LE radicular pain  MUSCLE LENGTH: Hamstrings: mild tight B Quads: mod tight B  POSTURE: No Significant postural  limitations  PALPATION: Lumbar paraspinals & glutes - muscle tension WNL w/o TTP  LUMBAR ROM:   AROM eval 04/26/22   Flexion <25% WNL- just weak coming back up [facility] - still has to walk hands back up legs  Extension 50% 25% WNL  Right lateral flexion 25% WNL WFL  Left lateral flexion 25% WNL WFL  Right rotation WNL  WNL  Left rotation WNL  WNL   (Blank rows = not tested)  LOWER EXTREMITY ROM:     Grossly WFL but accessory trunk lean in standing   LOWER EXTREMITY MMT:    MMT Right eval Left eval Right 04/26/22 Left 04/26/22 Right 05/17/22 Left 05/17/22  Hip flexion 2+ 3+ 4- 4- 4 4  Hip extension 2+ 2+ 4 4 4+ 4+  Hip abduction 4- _0 4- 4  Hip adduction 2+ 3+ 4 4+ 4 4+  Hip internal rotation 4+ 4+   5 5  Hip external rotation 4+ 4+   5 4+  Knee flexion _1 Knee extension _2 Ankle dorsiflexion _3 4+  Ankle plantarflexion        Ankle inversion        Ankle eversion         (Blank rows = not tested)  LUMBAR SPECIAL TESTS:  Straight leg raise test: Negative   GAIT: Distance walked: 60 feet, back and forth from exam room  Assistive device utilized: Single point cane Level of assistance: Modified independence Comments: increased lumbar extension  TODAY'S TREATMENT:                                                                                                                              DATE:   06/01/22 THERAPEUTIC EXERCISE: to improve flexibility, strength and mobility.  Verbal and tactile cues throughout for technique.  Recumbent Bike L2x77mn Seated deadlifts 6lb weight x 10  Seated trunk rotations trying to reach behind to practice wiping x 10 Standing pallof press blue TB 2x10 bil Seated LAQ 3# 2x10 bil Seated march 3# 2x10 bil  05/17/22 THERAPEUTIC EXERCISE: to improve flexibility, strength and mobility.  Verbal and tactile cues throughout for technique.  NuStep - L5 x 6 min Attempted hip hinge with pole behind back but deferred due  to complaints of increased low back pain Forward T with upper extremity support on elevated Hi-Lo table x 10  THERAPEUTIC ACTIVITIES: ROM MMT Goal assessment Modified Oswestry: 9 / 50 = 18.0% Provided instruction in self-STM techniques to glutes using tennis ball or foam roller on wall.  MANUAL THERAPY: To promote normalized muscle tension, improved flexibility, and reduced pain. Skilled palpation and monitoring of soft tissue during DN Trigger Point Dry-Needling  Treatment instructions: Expect mild to moderate muscle soreness. S/S of pneumothorax if dry needled over a lung field, and to seek immediate medical attention should they occur. Patient verbalized understanding of these instructions and education. Patient Consent Given: Yes Education handout provided: Previously provided Muscles treated: B glute maximus/medius/minimus Electrical stimulation performed: No Parameters: N/A Treatment response/outcome: Twitch Response Elicited and Palpable Increase in Muscle Length STM/DTM, manual TPR and pin & stretch to muscles addressed with DN   05/10/22 THERAPEUTIC EXERCISE: to improve flexibility, strength and mobility.  Verbal and tactile cues throughout for technique. Rec Bike L3 x 6 min Seated trunk rotations 5x both sides Seated deadlift 5lb x 10 Supine Figure 4 stretch 2x30 sec bil Supine LTR (bil hip ER/IR) x 10 both sides  MANUAL THERAPY: To promote normalized muscle tension, improved flexibility, and reduced pain. Skilled palpation and monitoring of soft tissue during DN Trigger Point Dry-Needling (performed by JAnnie Paras PT) Treatment instructions: Expect mild to moderate muscle soreness. Patient verbalized understanding of these instructions and education. Patient Consent Given: Yes Education handout provided: Previously provided Muscles treated: L glute maximus/medius/minimus & piriformis Electrical stimulation performed: Yes Parameters: N/A Treatment response/outcome:  Twitch Response Elicited and Palpable Increase in Muscle Length STM/DTM, manual TPR and pin & stretch to muscles addressed with DN   05/08/22 THERAPEUTIC EXERCISE: to improve flexibility, strength and mobility.  Verbal and tactile cues throughout for technique. Rec Bike L3 x 6 min STM + TrP release of R/L glute med/min  MANUAL THERAPY: To promote normalized muscle tension and reduced pain. Skilled palpation and monitoring of soft tissue during DN Trigger Point Dry-Needling  Treatment instructions: Expect mild to moderate muscle soreness.  Patient verbalized understanding of these instructions and education. Patient Consent Given: Yes Education handout provided: Yes Muscles treated: R/L glute med/min Electrical stimulation performed: No Parameters: N/A Treatment response/outcome: Twitch Response Elicited and Palpable Increase in Muscle Length STM/DTM, manual TPR and pin & stretch to muscles addressed with DN   PATIENT EDUCATION:  Education details: progress with PT, ongoing PT POC, self-STM techniques to glutes using tennis ball or foam roller on wall, role of DN, and DN rational, procedure, outcomes, potential side effects, and recommended post-treatment exercises/activity Person educated: Patient Education method: Explanation and Demonstration Education comprehension: verbalized understanding  HOME EXERCISE PROGRAM: Access Code: [email or web address] URL: [email or web address] Date: 04/17/2022 Prepared by: Almyra Free  Exercises - Hooklying Hamstring Stretch with Strap  - 2-3 x daily - 7 x weekly - 3 reps - 30 sec hold - Standing Back Extension at [place]  - 2-3 x daily - 7 x weekly - 2 sets - 15 reps - 3 sec hold - Tourist information centre manager with Chair and Counter Support  - 2-3 x daily - 7 x weekly - 3 reps - 30 sec hold - Supine Posterior Pelvic Tilt  - 1 x daily - 7 x weekly - 3 sets - 10 reps - Hooklying Clamshell with Resistance  - 1 x daily - 7 x weekly - 2 sets - 10 reps - Supine March  with Resistance Band  - 1 x daily - 7 x weekly - 3 sets - 10 reps - Seated Hip Adduction Isometrics with Ball  - 1 x daily - 7 x weekly - 2 sets - 10 reps - 5 hold - Supine March with Alternating Leg Lifts  - 1 x daily - 7 x weekly - 3 sets - 10 reps - Supine Pelvic Tilt with Straight Leg Raise  - 1 x daily - 7 x weekly - 2 sets - 10 reps - Heel Toe Raises with Counter Support  - 1 x daily - 3-4 x weekly - 2 sets - 10 reps - 3 sec hold - Standing Hip Flexion with Resistance Loop  - 1 x daily - 3-4 x weekly - 2 sets - 10 reps - 3 sec hold - Hip Abduction with Resistance Loop  - 1 x daily - 3-4 x weekly - 2 sets - 10 reps - 3 sec hold - Hip Extension with Resistance Loop  - 1 x daily - 3-4 x weekly - 2 sets - 10 reps - 3 sec hold - Squat with Medicine [employer]  - 1 x daily - 7 x weekly - 2-3 sets - 5 reps - 5 sec  hold  ASSESSMENT:  CLINICAL IMPRESSION: Pt was able to bend over and stand back up w/o increased pain but just reported tightness. She notes improvement with toileting however having difficulty with wiping d/t the twisting movement. We continued working on core stabilization and strengthening to improve ADL tolerance.   OBJECTIVE IMPAIRMENTS:  Abnormal gait, decreased activity tolerance, decreased balance, decreased endurance, decreased mobility, difficulty walking, decreased ROM, decreased strength, hypomobility, impaired perceived functional ability, impaired flexibility, improper body mechanics, and pain.   ACTIVITY LIMITATIONS: carrying, lifting, bending, sitting, standing, squatting, stairs, bed mobility, continence, bathing, toileting, dressing, and locomotion level  PARTICIPATION LIMITATIONS: cleaning, laundry, shopping, community activity, occupation, and attending festivals and ball games with grandchildren  PERSONAL FACTORS: Past/current experiences, Time since onset of injury/illness/exacerbation, and 3+ comorbidities: Diabetes, Osteoarthritis, Anxiety, GERD, R  shoulder replacement (10/2015), and L TKA are also affecting patient's functional outcome.  REHAB POTENTIAL: Good  CLINICAL DECISION MAKING: Evolving/moderate complexity  EVALUATION COMPLEXITY: Moderate   GOALS: Goals reviewed with patient? Yes  SHORT TERM GOALS: Target date: 04/24/2022  Pt will perform HEP independently to demonstrate understanding of activity. Baseline: Goal status: MET 04/10/22  2.  Pt will have a 25% reduction in pain during mobility to improve everyday activity. Baseline:  Goal status: MET 04/10/22 - Pt reports 100% improvement.  3.  Pt will have a 25% increase in lumbar mobility to perform ADLs such as toileting and bathing. Baseline:  Goal status: MET 04/26/22  4.  Pt will improve strength of proximal hip muscles to >/= 3/5 to increase ability to stand from a chair and walk.  Baseline:  Goal status: MET 04/26/22  LONG TERM GOALS: Target date: 05/22/22, extended 06/21/2022  Pt will perform HEP independently to demonstrate understanding of activity. Baseline:  Goal status: PARTIALLY MET 05/17/22 - Met for current HEP  2.  Pt will have 75% reduction in pain during mobility to improve everyday activity. Baseline:  Goal status: MET 05/17/22 - Pt reports 90% improvement in pain.   3. Pt will have a 75% increase in lumbar mobility to perform ADLs such as toileting and bathing. Baseline:  Goal status: PARTIALLY MET 05/17/22 - Pt reports 90% improvement with bathing but toileting remains dependent on height of toilet  4.  Pt will improve strength of proximal hip muscles to >/= 4+/5 to increase ability to stand from a chair and walk.  Baseline:  Goal status: REVISED 05/17/22  5.  Pt will improve modified Oswestry to 26% to show increase in functional activity. Baseline:  19 / 50 = 38.0 % Goal status: MET  05/17/22 - modified Oswestry = 9 / 50 = 18.0%  6.  Pt will perform STS with modified independence using single point cane to demonstrate  independence and ability to stand from chair.  Baseline:  Goal status: MET  05/03/22  7.  Patient will be able to return to standing from forward lumbar flexion without increased pain or need to walk hands up legs. Baseline:  Goal status: IN PROGRESS - 06/01/22    PLAN:  PT FREQUENCY: 2x/week  PT DURATION: 4-5 weeks  PLANNED INTERVENTIONS: Therapeutic exercises, Therapeutic activity, Neuromuscular re-education, Balance training, Gait training, Patient/Family education, Self Care, Joint mobilization, Joint manipulation, Stair training, Dry Needling, Electrical stimulation, Spinal manipulation, Spinal mobilization, Cryotherapy, Moist heat, Manual therapy, and Re-evaluation.  PLAN FOR NEXT SESSION: progress lumbopelvic mobility and core/proximal hip muscle strengthening - emphasize spinal erector and glute strength and include rotation mobility for ease of self cleaning; MT & DN of glutes as needed; gait; balance    Artist Pais, PTA 06/01/2022, 9:33 AM

## 2022-06-01 NOTE — Telephone Encounter (Signed)
Spoke with [employer]. Patient's application was received but has not been processed yet. Representative started the review process today. [employer] representative states patient should receive decision in 48 hours - either a phone call or letter sent to her regarding if her application was approved or denied.

## 2022-06-06 ENCOUNTER — Telehealth: Payer: Self-pay

## 2022-06-06 NOTE — Therapy (Signed)
OUTPATIENT PHYSICAL THERAPY TREATMENT    Patient Name: [name] MRN: [id number] DOB:[birth year], 67 y.o., female Today's Date: 03/24/2022               Past Medical History:  Diagnosis Date   Allergic rhinitis    Allergy    Anemia 07/17/2016   Anxiety    Asthma    seasonal   Blood transfusion without reported diagnosis    Breast cancer ([place])    right   Bronchitis    Colon polyps    CPAP (continuous positive airway pressure) dependence    Depression    Diabetes mellitus    borderline but takes metformin   Edema    Family history of breast cancer in first degree relative    Fibromyalgia    GERD (gastroesophageal reflux disease)    Hyperglycemia 03/05/2017   Hyperlipidemia 08/21/2016   no meds   Hyperparathyroidism    Hypertension    controlled by medications   Joint pain    Neuromuscular disorder ([place])    Fibromyalgia   Obesity    Osteoarthritis    RA   PONV (postoperative nausea and vomiting)    occasional   Pre-diabetes    Sleep apnea    wears cpap   Viral meningitis    Vitamin D deficiency     Past Surgical History:  Procedure Laterality Date   [place] SURGERY     09/21/21   BREAST EXCISIONAL BIOPSY Left 1993   benign   BREAST SURGERY     Tran flap due to breast cancer   CESAREAN SECTION  1988   COLONOSCOPY  08/14/2011   HAND SURGERY     dog bite, right hand   HAND SURGERY     trauma, left hand   KNEE ARTHROSCOPY     bilateral   left knee replacement  2010   LUMBAR WOUND DEBRIDEMENT N/A 10/07/2021   Procedure: LUMBAR WOUND DEBRIDEMENT/REVISION;  Surgeon: Ashok Pall, MD;  Location: [place];  Service: Neurosurgery;  Laterality: N/A;  3C/RM 21 to follow Dr Kathyrn Sheriff   MASTECTOMY  01/13/1994   Right breast   parathyroid resection     PARATHYROIDECTOMY     POLYPECTOMY     rectal abscess     SEPTOPLASTY  1980   SHOULDER ARTHROSCOPY Right 11/09/2015   Procedure: ARTHROSCOPY  SHOULDER-acromioplasty, distal clavicle resection and debridement;  Surgeon: Melrose Nakayama, MD;  Location: [place];  Service: Orthopedics;  Laterality: Right;   shoulder arthroscopy     rotator cuff repair   TOE SURGERY Right    paronychia and adenoma removed   TONSILLECTOMY     TOTAL KNEE ARTHROPLASTY  06/18/2008   [place]   TUMOR REMOVAL  1982   , scalp    Patient Active Problem List   Diagnosis Date Noted   Elevated sed rate 04/03/2022   Viral bronchitis 03/22/2022   Low back pain 01/11/2022   Drainage from wound 10/07/2021   Spondylolisthesis of lumbar region 09/21/2021   DDD (degenerative disc disease), cervical 05/04/2021   Neck pain 05/02/2021   Left arm pain 01/21/2021   Memory changes 04/19/2020   Educated about COVID-19 virus infection 04/19/2020   Peripheral neuropathy 04/19/2020   TMJ arthralgia 04/19/2020   Urinary incontinence 10/27/2019   Other peripheral vertigo, unspecified ear 10/27/2019   Vertigo 03/14/2018   RLS (restless legs syndrome) 06/07/2017   Right knee pain 12/06/2016   Arthralgia 12/03/2016  Hyperlipidemia 08/21/2016   Anemia 07/17/2016   Genetic testing 05/15/2016   Allergic urticaria 03/27/2016   Dermographia 03/27/2016   History of breast cancer 03/27/2016   Rheumatoid arthritis ([place]) 06/24/2015   Diverticulitis of colon without hemorrhage 09/21/2014   Asthma with acute exacerbation 07/14/2014   Cough 07/06/2014   Gout of big toe 03/10/2013   Preventative health care 10/08/2012   OSA (obstructive sleep apnea) 04/23/2012   Diabetes mellitus, type 2 ([place]) 12/10/2011   Diarrhea 09/04/2011   Personal history of colonic polyps - adenoma 08/14/2011   Fatigue 08/03/2011   Ear canal dryness 08/03/2011   Palpitations 09/21/2010   Morbid obesity ([place]) 09/21/2010   PAC (premature atrial contraction) 09/21/2010   ATTENTION DEFICIT DISORDER, INATTENTIVE TYPE 09/22/2009   Acute sinusitis 09/22/2009   VIRAL MENINGITIS, HX OF 04/29/2009    Insomnia 02/15/2009   Vitamin D deficiency 03/11/2008   Fibromyalgia 03/05/2008   HYPERPARATHYROIDISM, HX OF 08/06/2007   Allergic rhinitis 07/05/2007   Osteoarthritis 02/28/2007   Edema 11/01/2006   Depression with anxiety 06/25/2006   Essential hypertension 06/25/2006   Gastroesophageal reflux disease without esophagitis 06/25/2006     PCP: Mosie Lukes, MD  REFERRING PROVIDER: Mosie Lukes, MD  REFERRING DIAG: M54.41 (ICD-10-CM) - Right-sided low back pain with right-sided sciatica, unspecified chronicity  RATIONALE FOR EVALUATION AND TREATMENT: Rehabilitation  THERAPY DIAG:  No diagnosis found.   ONSET DATE: 09/21/21 - LUMBAR THREE-FOUR, LUMBAR FOUR-FIVE POSTERIOR LUMBAR INTERBODY FUSION   SUBJECTIVE:                                                                                                                                                                                           SUBJECTIVE STATEMENT: ***  PAIN:  Are you having pain? No and Yes: NPRS scale: 0/10 Pain location: L>R low back Pain description: "weakness" Aggravating factors: returning to standing after bending over Relieving factors: DN  PERTINENT HISTORY:  Diabetes, Osteoarthritis, Anxiety, GERD, R shoulder replacement (10/2015), L TKA, Back surgery (4/23)  PRECAUTIONS: None  WEIGHT BEARING RESTRICTIONS: No  FALLS:  Has patient fallen in last 6 months? No  LIVING ENVIRONMENT: Lives with: lives alone Lives in: House/apartment Stairs: No Has following equipment at home: Single point cane and elevated commode with bidet   OCCUPATION: PT - accounting, mostly deskwork  PLOF: Independent, Needs assistance with ADLs (difficulty with toilet hygiene), and Leisure: time with family.  PATIENT GOALS: "To be able to walk and be active again. To be able to clean myself after using the toilet. To be able to bend over an pick things up."   OBJECTIVE:   DIAGNOSTIC  FINDINGS:  MRI Lumbar  Spine w/o Contrast: 01/07/22  IMPRESSION: 1. Postsurgical changes from L3-L5 PLIF. Bone marrow signal changes within the spinous processes and bilateral lamina of L2, L3, and L4 raise the suspicion for osteomyelitis. 2. Previously described T2 hyperintense material along the right lateral and dorsal epidural space at the L2 and L2-3 levels is not seen on the current study. No IV contrast was administered on the current exam. No new epidural space abnormality is identified. 3. Similar degree of canal stenosis at L2-3.  CT Lumbar Spine w/o Contrast: 01/07/22 IMPRESSION: 1. No CT evidence for acute abnormality within the lumbar spine. 2. Postoperative changes from prior PLIF at L3-4 and L4-5. Mild subsidence at L3-4. No other hardware complication. 3. Underlying degenerative spondylosis and facet arthrosis, better appreciated and described on recent MRIs.  PATIENT SURVEYS:  Modified Oswestry:  19 / 50 = 38.0 %  FOTO: NT  SCREENING FOR RED FLAGS: Bowel or bladder incontinence: Yes: bladder (urge incontinence) Spinal tumors: No Cauda equina syndrome: No Compression fracture: No Abdominal aneurysm: No  COGNITION: Overall cognitive status: Within functional limits for tasks assessed     SENSATION: WFL Intermittent brief R LE radicular pain  MUSCLE LENGTH: Hamstrings: mild tight B Quads: mod tight B  POSTURE: No Significant postural limitations  PALPATION: Lumbar paraspinals & glutes - muscle tension WNL w/o TTP  LUMBAR ROM:   AROM eval 04/26/22   Flexion <25% WNL- just weak coming back up [facility] - still has to walk hands back up legs  Extension 50% 25% WNL  Right lateral flexion 25% WNL WFL  Left lateral flexion 25% WNL WFL  Right rotation WNL  WNL  Left rotation WNL  WNL   (Blank rows = not tested)  LOWER EXTREMITY ROM:     Grossly WFL but accessory trunk lean in standing   LOWER EXTREMITY MMT:    MMT Right eval Left eval Right 04/26/22 Left 04/26/22  Right 05/17/22 Left 05/17/22  Hip flexion 2+ 3+ 4- 4- 4 4  Hip extension 2+ 2+ 4 4 4+ 4+  Hip abduction 4- '4 4 4 '$ 4- 4  Hip adduction 2+ 3+ 4 4+ 4 4+  Hip internal rotation 4+ 4+   5 5  Hip external rotation 4+ 4+   5 4+  Knee flexion '5 5   5 5  '$ Knee extension '5 5   5 5  '$ Ankle dorsiflexion '5 4   5 '$ 4+  Ankle plantarflexion        Ankle inversion        Ankle eversion         (Blank rows = not tested)  LUMBAR SPECIAL TESTS:  Straight leg raise test: Negative   GAIT: Distance walked: 60 feet, back and forth from exam room Assistive device utilized: Single point cane Level of assistance: Modified independence Comments: increased lumbar extension  TODAY'S TREATMENT:  DATE:   06/07/22  THERAPEUTIC EXERCISE: to improve flexibility, strength and mobility.  Verbal and tactile cues throughout for technique.  Recumbent Bike L2x27mn Seated deadlifts 6lb weight x 10  Seated trunk rotations trying to reach behind to practice wiping x 10 Standing pallof press blue TB 2x10 bil Seated LAQ 3# 2x10 bil Seated march 3# 2x10 bil  06/01/22 THERAPEUTIC EXERCISE: to improve flexibility, strength and mobility.  Verbal and tactile cues throughout for technique.  Recumbent Bike L2x739m Seated deadlifts 6lb weight x 10  Seated trunk rotations trying to reach behind to practice wiping x 10 Standing pallof press blue TB 2x10 bil Seated LAQ 3# 2x10 bil Seated march 3# 2x10 bil  05/17/22 THERAPEUTIC EXERCISE: to improve flexibility, strength and mobility.  Verbal and tactile cues throughout for technique.  NuStep - L5 x 6 min Attempted hip hinge with pole behind back but deferred due to complaints of increased low back pain Forward T with upper extremity support on elevated Hi-Lo table x 10  THERAPEUTIC ACTIVITIES: ROM MMT Goal assessment Modified Oswestry: 9 / 50 =  18.0% Provided instruction in self-STM techniques to glutes using tennis ball or foam roller on wall.  MANUAL THERAPY: To promote normalized muscle tension, improved flexibility, and reduced pain. Skilled palpation and monitoring of soft tissue during DN Trigger Point Dry-Needling  Treatment instructions: Expect mild to moderate muscle soreness. S/S of pneumothorax if dry needled over a lung field, and to seek immediate medical attention should they occur. Patient verbalized understanding of these instructions and education. Patient Consent Given: Yes Education handout provided: Previously provided Muscles treated: B glute maximus/medius/minimus Electrical stimulation performed: No Parameters: N/A Treatment response/outcome: Twitch Response Elicited and Palpable Increase in Muscle Length STM/DTM, manual TPR and pin & stretch to muscles addressed with DN   PATIENT EDUCATION: *** Education details: progress with PT, ongoing PT POC, self-STM techniques to glutes using tennis ball or foam roller on wall, role of DN, and DN rational, procedure, outcomes, potential side effects, and recommended post-treatment exercises/activity Person educated: Patient Education method: Explanation and Demonstration Education comprehension: verbalized understanding  HOME EXERCISE PROGRAM: Access Code: [email or web address] URL: [email or web address] Date: 04/17/2022 Prepared by: JuAlmyra FreeExercises - Hooklying Hamstring Stretch with Strap  - 2-3 x daily - 7 x weekly - 3 reps - 30 sec hold - Standing Back Extension at [place]- 2-3 x daily - 7 x weekly - 2 sets - 15 reps - 3 sec hold - QuTourist information centre managerith Chair and Counter Support  - 2-3 x daily - 7 x weekly - 3 reps - 30 sec hold - Supine Posterior Pelvic Tilt  - 1 x daily - 7 x weekly - 3 sets - 10 reps - Hooklying Clamshell with Resistance  - 1 x daily - 7 x weekly - 2 sets - 10 reps - Supine March with Resistance Band  - 1 x daily - 7 x weekly - 3 sets  - 10 reps - Seated Hip Adduction Isometrics with Ball  - 1 x daily - 7 x weekly - 2 sets - 10 reps - 5 hold - Supine March with Alternating Leg Lifts  - 1 x daily - 7 x weekly - 3 sets - 10 reps - Supine Pelvic Tilt with Straight Leg Raise  - 1 x daily - 7 x weekly - 2 sets - 10 reps - Heel Toe Raises with Counter Support  - 1 x daily - 3-4 x weekly - 2 sets - 10 reps -  3 sec hold - Standing Hip Flexion with Resistance Loop  - 1 x daily - 3-4 x weekly - 2 sets - 10 reps - 3 sec hold - Hip Abduction with Resistance Loop  - 1 x daily - 3-4 x weekly - 2 sets - 10 reps - 3 sec hold - Hip Extension with Resistance Loop  - 1 x daily - 3-4 x weekly - 2 sets - 10 reps - 3 sec hold - Squat with Medicine [employer]  - 1 x daily - 7 x weekly - 2-3 sets - 5 reps - 5 sec  hold  ASSESSMENT:  CLINICAL IMPRESSION: ***  OBJECTIVE IMPAIRMENTS:  Abnormal gait, decreased activity tolerance, decreased balance, decreased endurance, decreased mobility, difficulty walking, decreased ROM, decreased strength, hypomobility, impaired perceived functional ability, impaired flexibility, improper body mechanics, and pain.   ACTIVITY LIMITATIONS: carrying, lifting, bending, sitting, standing, squatting, stairs, bed mobility, continence, bathing, toileting, dressing, and locomotion level  PARTICIPATION LIMITATIONS: cleaning, laundry, shopping, community activity, occupation, and attending festivals and ball games with grandchildren  PERSONAL FACTORS: Past/current experiences, Time since onset of injury/illness/exacerbation, and 3+ comorbidities: Diabetes, Osteoarthritis, Anxiety, GERD, R shoulder replacement (10/2015), and L TKA are also affecting patient's functional outcome.   REHAB POTENTIAL: Good  CLINICAL DECISION MAKING: Evolving/moderate complexity  EVALUATION COMPLEXITY: Moderate   GOALS: Goals reviewed with patient? Yes  SHORT TERM GOALS: Target date: 04/24/2022  Pt will perform HEP independently  to demonstrate understanding of activity. Baseline: Goal status: MET 04/10/22  2.  Pt will have a 25% reduction in pain during mobility to improve everyday activity. Baseline:  Goal status: MET 04/10/22 - Pt reports 100% improvement.  3.  Pt will have a 25% increase in lumbar mobility to perform ADLs such as toileting and bathing. Baseline:  Goal status: MET 04/26/22  4.  Pt will improve strength of proximal hip muscles to >/= 3/5 to increase ability to stand from a chair and walk.  Baseline:  Goal status: MET 04/26/22  LONG TERM GOALS: Target date: 05/22/22, extended 06/21/2022  Pt will perform HEP independently to demonstrate understanding of activity. Baseline:  Goal status: PARTIALLY MET 05/17/22 - Met for current HEP  2.  Pt will have 75% reduction in pain during mobility to improve everyday activity. Baseline:  Goal status: MET 05/17/22 - Pt reports 90% improvement in pain.   3. Pt will have a 75% increase in lumbar mobility to perform ADLs such as toileting and bathing. Baseline:  Goal status: PARTIALLY MET 05/17/22 - Pt reports 90% improvement with bathing but toileting remains dependent on height of toilet  4.  Pt will improve strength of proximal hip muscles to >/= 4+/5 to increase ability to stand from a chair and walk.  Baseline:  Goal status: REVISED 05/17/22  5.  Pt will improve modified Oswestry to 26% to show increase in functional activity. Baseline:  19 / 50 = 38.0 % Goal status: MET  05/17/22 - modified Oswestry = 9 / 50 = 18.0%  6.  Pt will perform STS with modified independence using single point cane to demonstrate independence and ability to stand from chair.  Baseline:  Goal status: MET  05/03/22  7.  Patient will be able to return to standing from forward lumbar flexion without increased pain or need to walk hands up legs. Baseline:  Goal status: IN PROGRESS - 06/01/22    PLAN:  PT FREQUENCY: 2x/week  PT DURATION: 4-5 weeks  PLANNED  INTERVENTIONS: Therapeutic exercises, Therapeutic  activity, Neuromuscular re-education, Balance training, Gait training, Patient/Family education, Self Care, Joint mobilization, Joint manipulation, Stair training, Dry Needling, Electrical stimulation, Spinal manipulation, Spinal mobilization, Cryotherapy, Moist heat, Manual therapy, and Re-evaluation.  PLAN FOR NEXT SESSION: progress lumbopelvic mobility and core/proximal hip muscle strengthening - emphasize spinal erector and glute strength and include rotation mobility for ease of self cleaning; MT & DN of glutes as needed; gait; balance    Anelisse Jacobson, PT 06/06/2022, 8:22 PM

## 2022-06-06 NOTE — Telephone Encounter (Signed)
Called pt was advised Ozempic 0.25 Ready for pickup

## 2022-06-07 ENCOUNTER — Encounter: Payer: Self-pay | Admitting: Physical Therapy

## 2022-06-07 ENCOUNTER — Ambulatory Visit: Payer: Medicare Other | Admitting: Physical Therapy

## 2022-06-07 DIAGNOSIS — M5459 Other low back pain: Secondary | ICD-10-CM | POA: Diagnosis not present

## 2022-06-07 DIAGNOSIS — R2689 Other abnormalities of gait and mobility: Secondary | ICD-10-CM | POA: Diagnosis not present

## 2022-06-07 DIAGNOSIS — M6281 Muscle weakness (generalized): Secondary | ICD-10-CM

## 2022-06-08 ENCOUNTER — Encounter: Payer: Self-pay | Admitting: Nurse Practitioner

## 2022-06-08 ENCOUNTER — Ambulatory Visit (INDEPENDENT_AMBULATORY_CARE_PROVIDER_SITE_OTHER): Payer: Medicare Other | Admitting: Nurse Practitioner

## 2022-06-08 ENCOUNTER — Telehealth: Payer: Self-pay | Admitting: Nurse Practitioner

## 2022-06-08 VITALS — BP 120/71 | HR 102 | Temp 97.4°F | Ht 66.0 in | Wt 280.6 lb

## 2022-06-08 DIAGNOSIS — J029 Acute pharyngitis, unspecified: Secondary | ICD-10-CM | POA: Diagnosis not present

## 2022-06-08 DIAGNOSIS — U071 COVID-19: Secondary | ICD-10-CM | POA: Diagnosis not present

## 2022-06-08 LAB — POCT INFLUENZA A/B
Influenza A, POC: NEGATIVE
Influenza B, POC: NEGATIVE

## 2022-06-08 LAB — POC COVID19 BINAXNOW: SARS Coronavirus 2 Ag: POSITIVE — AB

## 2022-06-08 MED ORDER — NIRMATRELVIR/RITONAVIR (PAXLOVID)TABLET: 3.0000 | ORAL_TABLET | Freq: Two times a day (BID) | ORAL | 0 refills | Status: DC

## 2022-06-08 MED ORDER — MOLNUPIRAVIR EUA 200MG CAPSULE: 4.0000 | ORAL_CAPSULE | Freq: Two times a day (BID) | ORAL | 0 refills | Status: AC

## 2022-06-08 MED ORDER — PROMETHAZINE-DM 6.25-15 MG/5ML PO SYRP: 5.0000 mL | ORAL_SOLUTION | Freq: Four times a day (QID) | ORAL | 0 refills | Status: DC | PRN

## 2022-06-08 NOTE — Progress Notes (Signed)
Acute Office Visit  Subjective:     Patient ID: [name], female    DOB: 1955-06-21, 67 y.o.   MRN: [id number]  Chief Complaint  Patient presents with   Sore Throat    Sore throat , diaherra, ear aches, chills, no fever, coughing, no chest pain , nasal congestion,  stream from the shower and vick rub  throat spray     HPI Patient is in today for sore throat, diarrhea, and ear pain that started yesterday.   UPPER RESPIRATORY TRACT INFECTION  Fever: no Cough: yes Shortness of breath: no Wheezing: no Chest pain: no Chest tightness: no Chest congestion: no Nasal congestion: yes Runny nose: yes Post nasal drip: yes Sneezing: yes Sore throat: yes Swollen glands: yes Sinus pressure: yes Headache: yes Face pain: no Toothache: no Ear pain: yes left Ear pressure: yes bilateral Eyes red/itching:no Eye drainage/crusting: no  Vomiting: no Rash: no Fatigue: yes Sick contacts: yes - mom had covid on christmas, son flu recently  Strep contacts: no  Context: worse Recurrent sinusitis: no Relief with OTC cold/cough medications: no  Treatments attempted: flonase, netti pot, sinus medication   ROS See pertinent positives and negatives per HPI.     Objective:    BP 120/71   Pulse (!) 102   Temp (!) 97.4 F (36.3 C)   Ht '5\' 6"'$  (1.676 m)   Wt 280 lb 9.6 oz (127.3 kg)   SpO2 96%   BMI 45.29 kg/m    Physical Exam Vitals and nursing note reviewed.  Constitutional:      General: She is not in acute distress.    Appearance: Normal appearance.  HENT:     Head: Normocephalic.     Right Ear: Tympanic membrane, ear canal and external ear normal.     Left Ear: Tympanic membrane, ear canal and external ear normal.     Nose:     Right Sinus: No maxillary sinus tenderness or frontal sinus tenderness.     Left Sinus: No maxillary sinus tenderness or frontal sinus tenderness.  Eyes:     Conjunctiva/sclera: Conjunctivae normal.  Cardiovascular:     Rate and  Rhythm: Normal rate and regular rhythm.     Pulses: Normal pulses.     Heart sounds: Normal heart sounds.  Pulmonary:     Effort: Pulmonary effort is normal.     Breath sounds: Normal breath sounds.  Musculoskeletal:     Cervical back: Normal range of motion.  Skin:    General: Skin is warm.  Neurological:     General: No focal deficit present.     Mental Status: She is alert and oriented to person, place, and time.  Psychiatric:        Mood and Affect: Mood normal.        Behavior: Behavior normal.        Thought Content: Thought content normal.        Judgment: Judgment normal.     Results for orders placed or performed in visit on 06/08/22  POC COVID-19  Result Value Ref Range   SARS Coronavirus 2 Ag Positive (A) Negative  POCT Influenza A/B  Result Value Ref Range   Influenza A, POC Negative Negative   Influenza B, POC Negative Negative        Assessment & Plan:   Problem List Items Addressed This Visit       Other   COVID-19 - Primary    Symptoms started yesterday. Will start  her on paxlovid BID x 5 days. She can continue OTC medications to help with symptoms. Encouraged rest and fluids. Will give her promethazine dm for cough.   Reviewed home care instructions for COVID. Advised self-isolation at home for at least 5 days. After 5 days, if improved and fever resolved, can be in public, but should wear a mask around others for an additional 5 days. If symptoms, esp, dyspnea develops/worsens, recommend in-person evaluation at either an urgent care or the emergency room.  Addendum: She called and was unable to afford paxlovid. Will send in molnupiravir to see if this is more affordable.       Relevant Medications   molnupiravir EUA (LAGEVRIO) 200 mg CAPS capsule   Other Visit Diagnoses     Sore throat       covid positive, flu negative. She can gargle warm salt water to help with sore throat. See plan above.   Relevant Orders   POC COVID-19 (Completed)   POCT  Influenza A/B (Completed)       Meds ordered this encounter  Medications   promethazine-dextromethorphan (PROMETHAZINE-DM) 6.25-15 MG/5ML syrup    Sig: Take 5 mLs by mouth 4 (four) times daily as needed for cough.    Dispense:  118 mL    Refill:  0   DISCONTD: nirmatrelvir/ritonavir (PAXLOVID) 20 x 150 MG & 10 x '100MG'$  TABS    Sig: Take 3 tablets by mouth 2 (two) times daily for 5 days. (Take nirmatrelvir 150 mg two tablets twice daily for 5 days and ritonavir 100 mg one tablet twice daily for 5 days) Patient GFR is 67    Dispense:  30 tablet    Refill:  0   molnupiravir EUA (LAGEVRIO) 200 mg CAPS capsule    Sig: Take 4 capsules (800 mg total) by mouth 2 (two) times daily for 5 days.    Dispense:  40 capsule    Refill:  0    Return if symptoms worsen or fail to improve.  Charyl Dancer, NP

## 2022-06-08 NOTE — Telephone Encounter (Signed)
Called and spoke with patient she said that it was the paxlovid  was $1,000  , we sent different medication to see if that was cheaper, pt was given a number to call to help with cost. Pt said that she will  call it it not cheaper.

## 2022-06-08 NOTE — Patient Instructions (Signed)
It was great to see you!  Start paxlovid 3 capsules twice a day for 5 days.   You can take the cough syrup 4 times a day as needed.   Continue your flonase and netti pot.   Isolate until Monday, wear a mask until next Saturday.  Let's follow-up if your symptoms worsen or don't improve.   Take care,  Vance Peper, NP

## 2022-06-08 NOTE — Telephone Encounter (Signed)
Please advise 

## 2022-06-08 NOTE — Telephone Encounter (Signed)
Pt called and stated that the medication you presribed for her is to expensive . She is at the pharmacy now and want to know if you can find a lower priced medication

## 2022-06-08 NOTE — Assessment & Plan Note (Signed)
Symptoms started yesterday. Will start her on paxlovid BID x 5 days. She can continue OTC medications to help with symptoms. Encouraged rest and fluids. Will give her promethazine dm for cough.   Reviewed home care instructions for COVID. Advised self-isolation at home for at least 5 days. After 5 days, if improved and fever resolved, can be in public, but should wear a mask around others for an additional 5 days. If symptoms, esp, dyspnea develops/worsens, recommend in-person evaluation at either an urgent care or the emergency room.  Addendum: She called and was unable to afford paxlovid. Will send in molnupiravir to see if this is more affordable.

## 2022-06-09 ENCOUNTER — Ambulatory Visit: Payer: Medicare Other | Admitting: Physical Therapy

## 2022-06-12 NOTE — Therapy (Signed)
OUTPATIENT PHYSICAL THERAPY TREATMENT    Patient Name: [name] MRN: [id number] DOB:Aug 03, 1955, 67 y.o., female Today's Date: 03/24/2022                Past Medical History:  Diagnosis Date   Allergic rhinitis    Allergy    Anemia 07/17/2016   Anxiety    Asthma    seasonal   Blood transfusion without reported diagnosis    Breast cancer ([place])    right   Bronchitis    Colon polyps    CPAP (continuous positive airway pressure) dependence    Depression    Diabetes mellitus    borderline but takes metformin   Edema    Family history of breast cancer in first degree relative    Fibromyalgia    GERD (gastroesophageal reflux disease)    Hyperglycemia 03/05/2017   Hyperlipidemia 08/21/2016   no meds   Hyperparathyroidism    Hypertension    controlled by medications   Joint pain    Neuromuscular disorder ([place])    Fibromyalgia   Obesity    Osteoarthritis    RA   PONV (postoperative nausea and vomiting)    occasional   Pre-diabetes    Sleep apnea    wears cpap   Viral meningitis    Vitamin D deficiency     Past Surgical History:  Procedure Laterality Date   [place] SURGERY     09/21/21   BREAST EXCISIONAL BIOPSY Left 1993   benign   BREAST SURGERY     Tran flap due to breast cancer   CESAREAN SECTION  1988   COLONOSCOPY  08/14/2011   HAND SURGERY     dog bite, right hand   HAND SURGERY     trauma, left hand   KNEE ARTHROSCOPY     bilateral   left knee replacement  2010   LUMBAR WOUND DEBRIDEMENT N/A 10/07/2021   Procedure: LUMBAR WOUND DEBRIDEMENT/REVISION;  Surgeon: Ashok Pall, MD;  Location: [facility];  Service: Neurosurgery;  Laterality: N/A;  3C/RM 21 to follow Dr Kathyrn Sheriff   MASTECTOMY  01/13/1994   Right breast   parathyroid resection     PARATHYROIDECTOMY     POLYPECTOMY     rectal abscess     SEPTOPLASTY  1980   SHOULDER ARTHROSCOPY Right 11/09/2015   Procedure: ARTHROSCOPY  SHOULDER-acromioplasty, distal clavicle resection and debridement;  Surgeon: Melrose Nakayama, MD;  Location: [facility];  Service: Orthopedics;  Laterality: Right;   shoulder arthroscopy     rotator cuff repair   TOE SURGERY Right    paronychia and adenoma removed   TONSILLECTOMY     TOTAL KNEE ARTHROPLASTY  06/18/2008   [facility]   TUMOR REMOVAL  1982   , scalp    Patient Active Problem List   Diagnosis Date Noted   COVID-19 06/08/2022   Elevated sed rate 04/03/2022   Viral bronchitis 03/22/2022   Low back pain 01/11/2022   Drainage from wound 10/07/2021   Spondylolisthesis of lumbar region 09/21/2021   DDD (degenerative disc disease), cervical 05/04/2021   Neck pain 05/02/2021   Left arm pain 01/21/2021   Memory changes 04/19/2020   Educated about COVID-19 virus infection 04/19/2020   Peripheral neuropathy 04/19/2020   TMJ arthralgia 04/19/2020   Urinary incontinence 10/27/2019   Other peripheral vertigo, unspecified ear 10/27/2019   Vertigo 03/14/2018   RLS (restless legs syndrome) 06/07/2017   Right knee pain  12/06/2016   Arthralgia 12/03/2016   Hyperlipidemia 08/21/2016   Anemia 07/17/2016   Genetic testing 05/15/2016   Allergic urticaria 03/27/2016   Dermographia 03/27/2016   History of breast cancer 03/27/2016   Rheumatoid arthritis ([place]) 06/24/2015   Diverticulitis of colon without hemorrhage 09/21/2014   Asthma with acute exacerbation 07/14/2014   Cough 07/06/2014   Gout of big toe 03/10/2013   Preventative health care 10/08/2012   OSA (obstructive sleep apnea) 04/23/2012   Diabetes mellitus, type 2 ([place]) 12/10/2011   Diarrhea 09/04/2011   Personal history of colonic polyps - adenoma 08/14/2011   Fatigue 08/03/2011   Ear canal dryness 08/03/2011   Palpitations 09/21/2010   Morbid obesity ([place]) 09/21/2010   PAC (premature atrial contraction) 09/21/2010   ATTENTION DEFICIT DISORDER, INATTENTIVE TYPE 09/22/2009   Acute sinusitis 09/22/2009   VIRAL MENINGITIS, HX  OF 04/29/2009   Insomnia 02/15/2009   Vitamin D deficiency 03/11/2008   Fibromyalgia 03/05/2008   HYPERPARATHYROIDISM, HX OF 08/06/2007   Allergic rhinitis 07/05/2007   Osteoarthritis 02/28/2007   Edema 11/01/2006   Depression with anxiety 06/25/2006   Essential hypertension 06/25/2006   Gastroesophageal reflux disease without esophagitis 06/25/2006     PCP: Mosie Lukes, MD  REFERRING PROVIDER: Mosie Lukes, MD  REFERRING DIAG: M54.41 (ICD-10-CM) - Right-sided low back pain with right-sided sciatica, unspecified chronicity  RATIONALE FOR EVALUATION AND TREATMENT: Rehabilitation  THERAPY DIAG:  No diagnosis found.   ONSET DATE: 09/21/21 - LUMBAR THREE-FOUR, LUMBAR FOUR-FIVE POSTERIOR LUMBAR INTERBODY FUSION   SUBJECTIVE:                                                                                                                                                                                           SUBJECTIVE STATEMENT: ***  PAIN:  Are you having pain? No and Yes: NPRS scale: 0/10 Pain location: L>R low back Pain description: "weakness" Aggravating factors: returning to standing after bending over Relieving factors: DN  PERTINENT HISTORY:  Diabetes, Osteoarthritis, Anxiety, GERD, R shoulder replacement (10/2015), L TKA, Back surgery (4/23)  PRECAUTIONS: None  WEIGHT BEARING RESTRICTIONS: No  FALLS:  Has patient fallen in last 6 months? No  LIVING ENVIRONMENT: Lives with: lives alone Lives in: House/apartment Stairs: No Has following equipment at home: Single point cane and elevated commode with bidet   OCCUPATION: PT - accounting, mostly deskwork  PLOF: Independent, Needs assistance with ADLs (difficulty with toilet hygiene), and Leisure: time with family.  PATIENT GOALS: "To be able to walk and be active again. To be able to clean myself after using the toilet. To be able to bend over an pick things  up."   OBJECTIVE:   DIAGNOSTIC FINDINGS:   MRI Lumbar Spine w/o Contrast: 01/07/22  IMPRESSION: 1. Postsurgical changes from L3-L5 PLIF. Bone marrow signal changes within the spinous processes and bilateral lamina of L2, L3, and L4 raise the suspicion for osteomyelitis. 2. Previously described T2 hyperintense material along the right lateral and dorsal epidural space at the L2 and L2-3 levels is not seen on the current study. No IV contrast was administered on the current exam. No new epidural space abnormality is identified. 3. Similar degree of canal stenosis at L2-3.  CT Lumbar Spine w/o Contrast: 01/07/22 IMPRESSION: 1. No CT evidence for acute abnormality within the lumbar spine. 2. Postoperative changes from prior PLIF at L3-4 and L4-5. Mild subsidence at L3-4. No other hardware complication. 3. Underlying degenerative spondylosis and facet arthrosis, better appreciated and described on recent MRIs.  PATIENT SURVEYS:  Modified Oswestry:  19 / 50 = 38.0 %  FOTO: NT  SCREENING FOR RED FLAGS: Bowel or bladder incontinence: Yes: bladder (urge incontinence) Spinal tumors: No Cauda equina syndrome: No Compression fracture: No Abdominal aneurysm: No  COGNITION: Overall cognitive status: Within functional limits for tasks assessed     SENSATION: WFL Intermittent brief R LE radicular pain  MUSCLE LENGTH: Hamstrings: mild tight B Quads: mod tight B  POSTURE: No Significant postural limitations  PALPATION: Lumbar paraspinals & glutes - muscle tension WNL w/o TTP  LUMBAR ROM:   AROM eval 04/26/22   Flexion <25% WNL- just weak coming back up [facility] - still has to walk hands back up legs  Extension 50% 25% WNL  Right lateral flexion 25% WNL WFL  Left lateral flexion 25% WNL WFL  Right rotation WNL  WNL  Left rotation WNL  WNL   (Blank rows = not tested)  LOWER EXTREMITY ROM:     Grossly WFL but accessory trunk lean in standing   LOWER EXTREMITY MMT:    MMT Right eval Left eval Right 04/26/22  Left 04/26/22 Right 05/17/22 Left 05/17/22  Hip flexion 2+ 3+ 4- 4- 4 4  Hip extension 2+ 2+ 4 4 4+ 4+  Hip abduction 4- '4 4 4 '$ 4- 4  Hip adduction 2+ 3+ 4 4+ 4 4+  Hip internal rotation 4+ 4+   5 5  Hip external rotation 4+ 4+   5 4+  Knee flexion '5 5   5 5  '$ Knee extension '5 5   5 5  '$ Ankle dorsiflexion '5 4   5 '$ 4+  Ankle plantarflexion        Ankle inversion        Ankle eversion         (Blank rows = not tested)  LUMBAR SPECIAL TESTS:  Straight leg raise test: Negative   GAIT: Distance walked: 60 feet, back and forth from exam room Assistive device utilized: Single point cane Level of assistance: Modified independence Comments: increased lumbar extension  TODAY'S TREATMENT:  DATE:  06/13/22  THERAPEUTIC EXERCISE: to improve flexibility, strength and mobility.  Verbal and tactile cues throughout for technique.  Recumbent Bike L2x77mn    06/07/22  THERAPEUTIC EXERCISE: to improve flexibility, strength and mobility.  Verbal and tactile cues throughout for technique.  Recumbent Bike L2x657m Seated deadlifts 10lb weight x 10  Standing dead lifts: attempted to stool - pain; hands sliding to knees painful so did minimal range. Worked on hinge position with scapular retraction and then small range lumbar flex Also did hinge position with bil OH reach x 3 sec hold to bil shoulder ext x 3 sec - multiple reps, but pt fatigues easily requiring rest.  MANUAL THERAPY: To promote normalized muscle tension, improved flexibility, and reduced pain. Skilled palpation and monitoring of soft tissue during DN Trigger Point Dry-Needling  Treatment instructions: Expect mild to moderate muscle soreness. S/S of pneumothorax if dry needled over a lung field, and to seek immediate medical attention should they occur. Patient verbalized understanding of these instructions and  education. Patient Consent Given: Yes Education handout provided: Previously provided Muscles treated: B glute maximus/medius/minimus Electrical stimulation performed: No Parameters: N/A Treatment response/outcome: Twitch Response Elicited and Palpable Increase in Muscle Length STM/DTM to bil gluteals   06/01/22 THERAPEUTIC EXERCISE: to improve flexibility, strength and mobility.  Verbal and tactile cues throughout for technique.  Recumbent Bike [id number] deadlifts 6lb weight x 10  Seated trunk rotations trying to reach behind to practice wiping x 10 Standing pallof press blue TB 2x10 bil Seated LAQ 3# 2x10 bil Seated march 3# 2x10 bil  05/17/22 THERAPEUTIC EXERCISE: to improve flexibility, strength and mobility.  Verbal and tactile cues throughout for technique.  NuStep - L5 x 6 min Attempted hip hinge with pole behind back but deferred due to complaints of increased low back pain Forward T with upper extremity support on elevated Hi-Lo table x 10  THERAPEUTIC ACTIVITIES: ROM MMT Goal assessment Modified Oswestry: 9 / 50 = 18.0% Provided instruction in self-STM techniques to glutes using tennis ball or foam roller on wall.  MANUAL THERAPY: To promote normalized muscle tension, improved flexibility, and reduced pain. Skilled palpation and monitoring of soft tissue during DN Trigger Point Dry-Needling  Treatment instructions: Expect mild to moderate muscle soreness. S/S of pneumothorax if dry needled over a lung field, and to seek immediate medical attention should they occur. Patient verbalized understanding of these instructions and education. Patient Consent Given: Yes Education handout provided: Previously provided Muscles treated: B glute maximus/medius/minimus Electrical stimulation performed: No Parameters: N/A Treatment response/outcome: Twitch Response Elicited and Palpable Increase in Muscle Length STM/DTM, manual TPR and pin & stretch to muscles addressed with  DN   PATIENT EDUCATION:  Education details: progress with PT, ongoing PT POC, self-STM techniques to glutes using tennis ball or foam roller on wall, role of DN, and DN rational, procedure, outcomes, potential side effects, and recommended post-treatment exercises/activity Person educated: Patient Education method: Explanation and Demonstration Education comprehension: verbalized understanding  HOME EXERCISE PROGRAM: Access Code: [email or web address] URL: [email or web address] Date: 04/17/2022 Prepared by: JulAlmyra Freexercises - Hooklying Hamstring Stretch with Strap  - 2-3 x daily - 7 x weekly - 3 reps - 30 sec hold - Standing Back Extension at Wall  - 2-3 x daily - 7 x weekly - 2 sets - 15 reps - 3 sec hold - Quadricep Stretch with Chair and Counter Support  - 2-3 x daily - 7 x weekly - 3 reps - 30 sec hold - Supine Posterior  Pelvic Tilt  - 1 x daily - 7 x weekly - 3 sets - 10 reps - Hooklying Clamshell with Resistance  - 1 x daily - 7 x weekly - 2 sets - 10 reps - Supine March with Resistance Band  - 1 x daily - 7 x weekly - 3 sets - 10 reps - Seated Hip Adduction Isometrics with Ball  - 1 x daily - 7 x weekly - 2 sets - 10 reps - 5 hold - Supine March with Alternating Leg Lifts  - 1 x daily - 7 x weekly - 3 sets - 10 reps - Supine Pelvic Tilt with Straight Leg Raise  - 1 x daily - 7 x weekly - 2 sets - 10 reps - Heel Toe Raises with Counter Support  - 1 x daily - 3-4 x weekly - 2 sets - 10 reps - 3 sec hold - Standing Hip Flexion with Resistance Loop  - 1 x daily - 3-4 x weekly - 2 sets - 10 reps - 3 sec hold - Hip Abduction with Resistance Loop  - 1 x daily - 3-4 x weekly - 2 sets - 10 reps - 3 sec hold - Hip Extension with Resistance Loop  - 1 x daily - 3-4 x weekly - 2 sets - 10 reps - 3 sec hold - Squat with Medicine [employer]  - 1 x daily - 7 x weekly - 2-3 sets - 5 reps - 5 sec  hold  ASSESSMENT:  CLINICAL IMPRESSION:  ***  Flecia is progressing well toward her  LTGs but still demonstrates significant low back weakness and upper back weakness limiting her ability to perform functional activities such as cleaning the commode and lining her trash bins. She fatigues almost immediately with lower lumbar strengthening and has difficulty maintaining scapular retraction for dead lifts. She will benefit from focused strengthening in these areas to meet ADLs goals. Sehar continues to demonstrate potential for improvement and would benefit from continued skilled therapy to address impairments.    OBJECTIVE IMPAIRMENTS:  Abnormal gait, decreased activity tolerance, decreased balance, decreased endurance, decreased mobility, difficulty walking, decreased ROM, decreased strength, hypomobility, impaired perceived functional ability, impaired flexibility, improper body mechanics, and pain.   ACTIVITY LIMITATIONS: carrying, lifting, bending, sitting, standing, squatting, stairs, bed mobility, continence, bathing, toileting, dressing, and locomotion level  PARTICIPATION LIMITATIONS: cleaning, laundry, shopping, community activity, occupation, and attending festivals and ball games with grandchildren  PERSONAL FACTORS: Past/current experiences, Time since onset of injury/illness/exacerbation, and 3+ comorbidities: Diabetes, Osteoarthritis, Anxiety, GERD, R shoulder replacement (10/2015), and L TKA are also affecting patient's functional outcome.   REHAB POTENTIAL: Good  CLINICAL DECISION MAKING: Evolving/moderate complexity  EVALUATION COMPLEXITY: Moderate   GOALS: Goals reviewed with patient? Yes  SHORT TERM GOALS: Target date: 04/24/2022  Pt will perform HEP independently to demonstrate understanding of activity. Baseline: Goal status: MET 04/10/22  2.  Pt will have a 25% reduction in pain during mobility to improve everyday activity. Baseline:  Goal status: MET 04/10/22 - Pt reports 100% improvement.  3.  Pt will have a 25% increase in lumbar mobility to  perform ADLs such as toileting and bathing. Baseline:  Goal status: MET 04/26/22  4.  Pt will improve strength of proximal hip muscles to >/= 3/5 to increase ability to stand from a chair and walk.  Baseline:  Goal status: MET 04/26/22  LONG TERM GOALS: Target date: 05/22/22, extended 06/21/2022  Pt will perform HEP independently to demonstrate understanding  of activity. Baseline:  Goal status: PARTIALLY MET 05/17/22 - Met for current HEP  2.  Pt will have 75% reduction in pain during mobility to improve everyday activity. Baseline:  Goal status: MET 05/17/22 - Pt reports 90% improvement in pain.   3. Pt will have a 75% increase in lumbar mobility to perform ADLs such as toileting and bathing. Baseline:  Goal status: MET 06/07/22   4.  Pt will improve strength of proximal hip muscles to >/= 4+/5 to increase ability to stand from a chair and walk.  Baseline:  Goal status: REVISED 05/17/22  5.  Pt will improve modified Oswestry to 26% to show increase in functional activity. Baseline:  19 / 50 = 38.0 % Goal status: MET  05/17/22 - modified Oswestry = 9 / 50 = 18.0%  6.  Pt will perform STS with modified independence using single point cane to demonstrate independence and ability to stand from chair.  Baseline:  Goal status: MET  05/03/22  7.  Patient will be able to return to standing from forward lumbar flexion without increased pain or need to walk hands up legs. Baseline:  Goal status: IN PROGRESS - 06/01/22    PLAN:  PT FREQUENCY: 2x/week  PT DURATION: 4-5 weeks  PLANNED INTERVENTIONS: Therapeutic exercises, Therapeutic activity, Neuromuscular re-education, Balance training, Gait training, Patient/Family education, Self Care, Joint mobilization, Joint manipulation, Stair training, Dry Needling, Electrical stimulation, Spinal manipulation, Spinal mobilization, Cryotherapy, Moist heat, Manual therapy, and Re-evaluation.  PLAN FOR NEXT SESSION: focus on hinge position lumbar  strengthening and upper back strengthening.    Holden Maniscalco, PT 06/12/2022, 6:55 PM

## 2022-06-13 ENCOUNTER — Encounter: Payer: Self-pay | Admitting: Physical Therapy

## 2022-06-13 ENCOUNTER — Ambulatory Visit: Payer: Medicare Other | Admitting: Physical Therapy

## 2022-06-13 DIAGNOSIS — R2689 Other abnormalities of gait and mobility: Secondary | ICD-10-CM

## 2022-06-13 DIAGNOSIS — M6281 Muscle weakness (generalized): Secondary | ICD-10-CM | POA: Diagnosis not present

## 2022-06-13 DIAGNOSIS — M5459 Other low back pain: Secondary | ICD-10-CM | POA: Diagnosis not present

## 2022-06-14 NOTE — Therapy (Signed)
OUTPATIENT PHYSICAL THERAPY TREATMENT    Patient Name: Nazifa Trinka MRN: [id number] DOB:[birth year], 67 y.o., female Today's Date: 03/24/2022   PT End of Session - 06/15/22 0937     Visit Number 16    Date for PT Re-Evaluation 06/21/22    Authorization Type [employer] Medicare    Progress Note Due on Visit 63    PT Start Time 0935    PT Stop Time 1021    PT Time Calculation (min) 46 min    Activity Tolerance Patient tolerated treatment well    Behavior During Therapy [facility] for tasks assessed/performed                         Past Medical History:  Diagnosis Date   Allergic rhinitis    Allergy    Anemia 07/17/2016   Anxiety    Asthma    seasonal   Blood transfusion without reported diagnosis    Breast cancer ([place])    right   Bronchitis    Colon polyps    CPAP (continuous positive airway pressure) dependence    Depression    Diabetes mellitus    borderline but takes metformin   Edema    Family history of breast cancer in first degree relative    Fibromyalgia    GERD (gastroesophageal reflux disease)    Hyperglycemia 03/05/2017   Hyperlipidemia 08/21/2016   no meds   Hyperparathyroidism    Hypertension    controlled by medications   Joint pain    Neuromuscular disorder ([place])    Fibromyalgia   Obesity    Osteoarthritis    RA   PONV (postoperative nausea and vomiting)    occasional   Pre-diabetes    Sleep apnea    wears cpap   Viral meningitis    Vitamin D deficiency     Past Surgical History:  Procedure Laterality Date   [place] SURGERY     09/21/21   BREAST EXCISIONAL BIOPSY Left 1993   benign   BREAST SURGERY     Tran flap due to breast cancer   [place]   COLONOSCOPY  08/14/2011   HAND SURGERY     dog bite, right hand   HAND SURGERY     trauma, left hand   KNEE ARTHROSCOPY     bilateral   left knee replacement  2010   LUMBAR WOUND DEBRIDEMENT N/A 10/07/2021    Procedure: LUMBAR WOUND DEBRIDEMENT/REVISION;  Surgeon: Ashok Pall, MD;  Location: [place];  Service: Neurosurgery;  Laterality: N/A;  3C/RM 21 to follow Dr Kathyrn Sheriff   MASTECTOMY  01/13/1994   Right breast   parathyroid resection     PARATHYROIDECTOMY     POLYPECTOMY     rectal abscess     SEPTOPLASTY  1980   SHOULDER ARTHROSCOPY Right 11/09/2015   Procedure: ARTHROSCOPY SHOULDER-acromioplasty, distal clavicle resection and debridement;  Surgeon: Melrose Nakayama, MD;  Location: [place];  Service: Orthopedics;  Laterality: Right;   shoulder arthroscopy     rotator cuff repair   TOE SURGERY Right    paronychia and adenoma removed   TONSILLECTOMY     TOTAL KNEE ARTHROPLASTY  06/18/2008   [place]   TUMOR REMOVAL  1982   , scalp    Patient Active Problem List   Diagnosis Date Noted   COVID-19 06/08/2022   Elevated sed rate 04/03/2022   Viral bronchitis  03/22/2022   Low back pain 01/11/2022   Drainage from wound 10/07/2021   Spondylolisthesis of lumbar region 09/21/2021   DDD (degenerative disc disease), cervical 05/04/2021   Neck pain 05/02/2021   Left arm pain 01/21/2021   Memory changes 04/19/2020   Educated about COVID-19 virus infection 04/19/2020   Peripheral neuropathy 04/19/2020   TMJ arthralgia 04/19/2020   Urinary incontinence 10/27/2019   Other peripheral vertigo, unspecified ear 10/27/2019   Vertigo 03/14/2018   RLS (restless legs syndrome) 06/07/2017   Right knee pain 12/06/2016   Arthralgia 12/03/2016   Hyperlipidemia 08/21/2016   Anemia 07/17/2016   Genetic testing 05/15/2016   Allergic urticaria 03/27/2016   Dermographia 03/27/2016   History of breast cancer 03/27/2016   Rheumatoid arthritis ([place]) 06/24/2015   Diverticulitis of colon without hemorrhage 09/21/2014   Asthma with acute exacerbation 07/14/2014   Cough 07/06/2014   Gout of big toe 03/10/2013   Preventative health care 10/08/2012   OSA (obstructive sleep apnea) 04/23/2012   Diabetes mellitus,  type 2 ([place]) 12/10/2011   Diarrhea 09/04/2011   Personal history of colonic polyps - adenoma 08/14/2011   Fatigue 08/03/2011   Ear canal dryness 08/03/2011   Palpitations 09/21/2010   Morbid obesity ([place]) 09/21/2010   PAC (premature atrial contraction) 09/21/2010   ATTENTION DEFICIT DISORDER, INATTENTIVE TYPE 09/22/2009   Acute sinusitis 09/22/2009   VIRAL MENINGITIS, HX OF 04/29/2009   Insomnia 02/15/2009   Vitamin D deficiency 03/11/2008   Fibromyalgia 03/05/2008   HYPERPARATHYROIDISM, HX OF 08/06/2007   Allergic rhinitis 07/05/2007   Osteoarthritis 02/28/2007   Edema 11/01/2006   Depression with anxiety 06/25/2006   Essential hypertension 06/25/2006   Gastroesophageal reflux disease without esophagitis 06/25/2006     PCP: Mosie Lukes, MD  REFERRING PROVIDER: Mosie Lukes, MD  REFERRING DIAG: M54.41 (ICD-10-CM) - Right-sided low back pain with right-sided sciatica, unspecified chronicity  RATIONALE FOR EVALUATION AND TREATMENT: Rehabilitation  THERAPY DIAG:  Other low back pain  Muscle weakness (generalized)  Other abnormalities of gait and mobility   ONSET DATE: 09/21/21 - LUMBAR THREE-FOUR, LUMBAR FOUR-FIVE POSTERIOR LUMBAR INTERBODY FUSION   SUBJECTIVE:                                                                                                                                                                                           SUBJECTIVE STATEMENT: I had a massage yesterday which helped.  PAIN:  Are you having pain? No and Yes: NPRS scale: 0/10 Pain location: L>R low back Pain description: "weakness" Aggravating factors: returning to standing after bending over Relieving factors: DN  PERTINENT HISTORY:  Diabetes, Osteoarthritis, Anxiety,  GERD, R shoulder replacement (10/2015), L TKA, Back surgery (4/23)  PRECAUTIONS: None  WEIGHT BEARING RESTRICTIONS: No  FALLS:  Has patient fallen in last 6 months? No  LIVING ENVIRONMENT: Lives  with: lives alone Lives in: House/apartment Stairs: No Has following equipment at home: Single point cane and elevated commode with bidet   OCCUPATION: PT - accounting, mostly deskwork  PLOF: Independent, Needs assistance with ADLs (difficulty with toilet hygiene), and Leisure: time with family.  PATIENT GOALS: "To be able to walk and be active again. To be able to clean myself after using the toilet. To be able to bend over an pick things up."   OBJECTIVE:   DIAGNOSTIC FINDINGS:  MRI Lumbar Spine w/o Contrast: 01/07/22  IMPRESSION: 1. Postsurgical changes from L3-L5 PLIF. Bone marrow signal changes within the spinous processes and bilateral lamina of L2, L3, and L4 raise the suspicion for osteomyelitis. 2. Previously described T2 hyperintense material along the right lateral and dorsal epidural space at the L2 and L2-3 levels is not seen on the current study. No IV contrast was administered on the current exam. No new epidural space abnormality is identified. 3. Similar degree of canal stenosis at L2-3.  CT Lumbar Spine w/o Contrast: 01/07/22 IMPRESSION: 1. No CT evidence for acute abnormality within the lumbar spine. 2. Postoperative changes from prior PLIF at L3-4 and L4-5. Mild subsidence at L3-4. No other hardware complication. 3. Underlying degenerative spondylosis and facet arthrosis, better appreciated and described on recent MRIs.  PATIENT SURVEYS:  Modified Oswestry:  19 / 50 = 38.0 %  FOTO: NT  SCREENING FOR RED FLAGS: Bowel or bladder incontinence: Yes: bladder (urge incontinence) Spinal tumors: No Cauda equina syndrome: No Compression fracture: No Abdominal aneurysm: No  COGNITION: Overall cognitive status: Within functional limits for tasks assessed     SENSATION: WFL Intermittent brief R LE radicular pain  MUSCLE LENGTH: Hamstrings: mild tight B Quads: mod tight B  POSTURE: No Significant postural limitations  PALPATION: Lumbar paraspinals &  glutes - muscle tension WNL w/o TTP  LUMBAR ROM:   AROM eval 04/26/22   Flexion <25% WNL- just weak coming back up [facility] - still has to walk hands back up legs  Extension 50% 25% WNL  Right lateral flexion 25% WNL WFL  Left lateral flexion 25% WNL WFL  Right rotation WNL  WNL  Left rotation WNL  WNL   (Blank rows = not tested)  LOWER EXTREMITY ROM:     Grossly WFL but accessory trunk lean in standing   LOWER EXTREMITY MMT:    MMT Right eval Left eval Right 04/26/22 Left 04/26/22 Right 05/17/22 Left 05/17/22 Right  06/16/23 Left  06/16/23  Hip flexion 2+ 3+ 4- 4- 4 4 4+ 5  Hip extension 2+ 2+ 4 4 4+ 4+ 5 5  Hip abduction 4- '4 4 4 '$ 4- '4 5 5  '$ Hip adduction 2+ 3+ 4 4+ 4 4+ 5 5  Hip internal rotation 4+ 4+   '5 5 5 5  '$ Hip external rotation 4+ 4+   5 4+ 5 5  Knee flexion '5 5   5 5 5 5  '$ Knee extension '5 5   5 5 5 5  '$ Ankle dorsiflexion '5 4   5 '$ 4+ 5 5   (Blank rows = not tested)  LUMBAR SPECIAL TESTS:  Straight leg raise test: Negative   GAIT: Distance walked: 60 feet, back and forth from exam room Assistive device utilized: Single point cane Level of assistance:  Modified independence Comments: increased lumbar extension  TODAY'S TREATMENT:                                                                                                                              DATE:    06/15/22  THERAPEUTIC EXERCISE: to improve flexibility, strength and mobility.  Verbal and tactile cues throughout for technique.  Recumbent Bike L3x4mn Rows black TB x 10, Blue x 10 first Lat pull blue OH door x 10 sitting with elbows bent, Standing with straight arms Seated trunk ext black TB x 15  MANUAL THERAPY: To promote normalized muscle tension, improved flexibility, and reduced pain. Skilled palpation and monitoring of soft tissue during DN Trigger Point Dry-Needling  Treatment instructions: Expect mild to moderate muscle soreness. S/S of pneumothorax if dry needled over a lung field, and to  seek immediate medical attention should they occur. Patient verbalized understanding of these instructions and education. Patient Consent Given: Yes Education handout provided: Previously provided Muscles treated: B glute maximus/medius/minimus Electrical stimulation performed: No Parameters: N/A Treatment response/outcome: Twitch Response Elicited and Palpable Increase in Muscle Length STM/DTM to bil gluteals   06/13/22  THERAPEUTIC EXERCISE: to improve flexibility, strength and mobility.  Verbal and tactile cues throughout for technique.  Recumbent Bike L2x631m hinge position  at wall with bil OH reach x 5 sec hold x 6 Hinge position with bil shoulder ext 5 sec hold x 5 Dead lift to table x 5, then with 5# wt x 5 Arms crossed over chest - good mornings x 5 (hinge) Lat pull 2x10, one set 20# then 25# Rows 25# 1 x 10 vertical and vertical  OH press 10# x 10 both handles   STM with foam roller demo'd for gluteals and lumbar.   06/07/22  THERAPEUTIC EXERCISE: to improve flexibility, strength and mobility.  Verbal and tactile cues throughout for technique.  Recumbent Bike L2x6m21mSeated deadlifts 10lb weight x 10  Standing dead lifts: attempted to stool - pain; hands sliding to knees painful so did minimal range. Worked on hinge position with scapular retraction and then small range lumbar flex Also did hinge position with bil OH reach x 3 sec hold to bil shoulder ext x 3 sec - multiple reps, but pt fatigues easily requiring rest.  MANUAL THERAPY: To promote normalized muscle tension, improved flexibility, and reduced pain. Skilled palpation and monitoring of soft tissue during DN Trigger Point Dry-Needling  Treatment instructions: Expect mild to moderate muscle soreness. S/S of pneumothorax if dry needled over a lung field, and to seek immediate medical attention should they occur. Patient verbalized understanding of these instructions and education. Patient Consent Given:  Yes Education handout provided: Previously provided Muscles treated: B glute maximus/medius/minimus Electrical stimulation performed: No Parameters: N/A Treatment response/outcome: Twitch Response Elicited and Palpable Increase in Muscle Length STM/DTM to bil gluteals PATIENT EDUCATION:  Education details: progress with PT, ongoing PT POC, self-STM techniques to glutes using tennis ball or  foam roller on wall, role of DN, and DN rational, procedure, outcomes, potential side effects, and recommended post-treatment exercises/activity Person educated: Patient Education method: Explanation and Demonstration Education comprehension: verbalized understanding  HOME EXERCISE PROGRAM: Access Code: [email or web address] URL: [email or web address] Date: 06/15/2022 Prepared by: Almyra Free  Exercises - Hooklying Hamstring Stretch with Strap  - 2-3 x daily - 7 x weekly - 3 reps - 30 sec hold - Standing Back Extension at [place]  - 2-3 x daily - 7 x weekly - 2 sets - 15 reps - 3 sec hold - Tourist information centre manager with Chair and Counter Support  - 2-3 x daily - 7 x weekly - 3 reps - 30 sec hold - Supine Posterior Pelvic Tilt  - 1 x daily - 7 x weekly - 3 sets - 10 reps - Hooklying Clamshell with Resistance  - 1 x daily - 7 x weekly - 2 sets - 10 reps - Supine March with Resistance Band  - 1 x daily - 7 x weekly - 3 sets - 10 reps - Seated Hip Adduction Isometrics with Ball  - 1 x daily - 7 x weekly - 2 sets - 10 reps - 5 hold - Supine March with Alternating Leg Lifts  - 1 x daily - 7 x weekly - 3 sets - 10 reps - Supine Pelvic Tilt with Straight Leg Raise  - 1 x daily - 7 x weekly - 2 sets - 10 reps - Heel Toe Raises with Counter Support  - 1 x daily - 3-4 x weekly - 2 sets - 10 reps - 3 sec hold - Standing Hip Flexion with Resistance Loop  - 1 x daily - 3-4 x weekly - 2 sets - 10 reps - 3 sec hold - Hip Abduction with Resistance Loop  - 1 x daily - 3-4 x weekly - 2 sets - 10 reps - 3 sec hold - Hip Extension  with Resistance Loop  - 1 x daily - 3-4 x weekly - 2 sets - 10 reps - 3 sec hold - Squat with Medicine [employer]  - 1 x daily - 7 x weekly - 2-3 sets - 5 reps - 5 sec  hold - Prone Hip Extension  - 1 x daily - 3 x weekly - 2 sets - 10 reps - Seated Hip Flexion Toward Target  - 1 x daily - 3 x weekly - 2 sets - 10 reps - Supine Hip Internal and External Rotation  - 1 x daily - 7 x weekly - 2 sets - 10 reps - Supine Figure 4 Piriformis Stretch  - 1 x daily - 7 x weekly - 2 sets - 30 sec hold - Seated Lat Pull Down with Resistance - Elbows Bent  - 1 x daily - 3 x weekly - 3 sets - 10 reps - Standing Lat Pull Down with Resistance - Elbows Bent  - 1 x daily - 3 x weekly - 3 sets - 10 reps - Standing Shoulder Extension with Resistance  - 1 x daily - 3 x weekly - 3 sets - 10 reps - Seated Thoracic Lumbar Extension  - 1 x daily - 3 x weekly - 3 sets - 10 reps ASSESSMENT:  CLINICAL IMPRESSION: Nishita has MET her remaining LTGs. She continues to have trigger points in bil gluteals so DN performed again today with significant responses. PT advised self MFR using ball as maintenance. Orli should be ready for D/C at the end of her  POC next week.   OBJECTIVE IMPAIRMENTS:  Abnormal gait, decreased activity tolerance, decreased balance, decreased endurance, decreased mobility, difficulty walking, decreased ROM, decreased strength, hypomobility, impaired perceived functional ability, impaired flexibility, improper body mechanics, and pain.   ACTIVITY LIMITATIONS: carrying, lifting, bending, sitting, standing, squatting, stairs, bed mobility, continence, bathing, toileting, dressing, and locomotion level  PARTICIPATION LIMITATIONS: cleaning, laundry, shopping, community activity, occupation, and attending festivals and ball games with grandchildren  PERSONAL FACTORS: Past/current experiences, Time since onset of injury/illness/exacerbation, and 3+ comorbidities: Diabetes, Osteoarthritis, Anxiety,  GERD, R shoulder replacement (10/2015), and L TKA are also affecting patient's functional outcome.   REHAB POTENTIAL: Good  CLINICAL DECISION MAKING: Evolving/moderate complexity  EVALUATION COMPLEXITY: Moderate   GOALS: Goals reviewed with patient? Yes  SHORT TERM GOALS: Target date: 04/24/2022  Pt will perform HEP independently to demonstrate understanding of activity. Baseline: Goal status: MET 04/10/22  2.  Pt will have a 25% reduction in pain during mobility to improve everyday activity. Baseline:  Goal status: MET 04/10/22 - Pt reports 100% improvement.  3.  Pt will have a 25% increase in lumbar mobility to perform ADLs such as toileting and bathing. Baseline:  Goal status: MET 04/26/22  4.  Pt will improve strength of proximal hip muscles to >/= 3/5 to increase ability to stand from a chair and walk.  Baseline:  Goal status: MET 04/26/22  LONG TERM GOALS: Target date: 05/22/22, extended 06/21/2022  Pt will perform HEP independently to demonstrate understanding of activity. Baseline:  Goal status: PARTIALLY MET 05/17/22 - Met for current HEP  2.  Pt will have 75% reduction in pain during mobility to improve everyday activity. Baseline:  Goal status: MET 05/17/22 - Pt reports 90% improvement in pain.   3. Pt will have a 75% increase in lumbar mobility to perform ADLs such as toileting and bathing. Baseline:  Goal status: MET 06/07/22   4.  Pt will improve strength of proximal hip muscles to >/= 4+/5 to increase ability to stand from a chair and walk.  Baseline:  Goal status: MET 06/15/22  5.  Pt will improve modified Oswestry to 26% to show increase in functional activity. Baseline:  19 / 50 = 38.0 % Goal status: MET  05/17/22 - modified Oswestry = 9 / 50 = 18.0%  6.  Pt will perform STS with modified independence using single point cane to demonstrate independence and ability to stand from chair.  Baseline:  Goal status: MET  05/03/22  7.  Patient will be  able to return to standing from forward lumbar flexion without increased pain or need to walk hands up legs. Baseline:  Goal status: MET 06/15/22    PLAN:  PT FREQUENCY: 2x/week  PT DURATION: 4-5 weeks  PLANNED INTERVENTIONS: Therapeutic exercises, Therapeutic activity, Neuromuscular re-education, Balance training, Gait training, Patient/Family education, Self Care, Joint mobilization, Joint manipulation, Stair training, Dry Needling, Electrical stimulation, Spinal manipulation, Spinal mobilization, Cryotherapy, Moist heat, Manual therapy, and Re-evaluation.  PLAN FOR NEXT SESSION: focus on hinge position lumbar strengthening and upper back strengthening. Finalize HEP and prepare for d/c.    Micah Barnier, PT 06/15/2022, 12:08 PM

## 2022-06-15 ENCOUNTER — Ambulatory Visit: Payer: Medicare Other | Admitting: Physical Therapy

## 2022-06-15 ENCOUNTER — Encounter: Payer: Self-pay | Admitting: Physical Therapy

## 2022-06-15 DIAGNOSIS — M6281 Muscle weakness (generalized): Secondary | ICD-10-CM | POA: Diagnosis not present

## 2022-06-15 DIAGNOSIS — M5459 Other low back pain: Secondary | ICD-10-CM | POA: Diagnosis not present

## 2022-06-15 DIAGNOSIS — R2689 Other abnormalities of gait and mobility: Secondary | ICD-10-CM

## 2022-06-16 ENCOUNTER — Other Ambulatory Visit: Payer: Self-pay | Admitting: Obstetrics and Gynecology

## 2022-06-16 DIAGNOSIS — Z1231 Encounter for screening mammogram for malignant neoplasm of breast: Secondary | ICD-10-CM

## 2022-06-19 ENCOUNTER — Encounter: Payer: Self-pay | Admitting: Physical Therapy

## 2022-06-19 ENCOUNTER — Ambulatory Visit: Payer: Medicare Other | Admitting: Physical Therapy

## 2022-06-19 DIAGNOSIS — R2689 Other abnormalities of gait and mobility: Secondary | ICD-10-CM | POA: Diagnosis not present

## 2022-06-19 DIAGNOSIS — M5459 Other low back pain: Secondary | ICD-10-CM | POA: Diagnosis not present

## 2022-06-19 DIAGNOSIS — M6281 Muscle weakness (generalized): Secondary | ICD-10-CM | POA: Diagnosis not present

## 2022-06-19 NOTE — Therapy (Addendum)
OUTPATIENT PHYSICAL THERAPY TREATMENT / DISCHARGE SUMMARY    Patient Name: [name] MRN: [id number] DOB:09-11-1955, 67 y.o., female Today's Date: 03/24/2022   PT End of Session - 06/19/22 0930     Visit Number 17    Date for PT Re-Evaluation 06/21/22    Authorization Type [employer] Medicare    Progress Note Due on Visit 20    PT Start Time 0932    PT Stop Time 1017    PT Time Calculation (min) 45 min    Activity Tolerance Patient tolerated treatment well    Behavior During Therapy [facility] for tasks assessed/performed                          Past Medical History:  Diagnosis Date   Allergic rhinitis    Allergy    Anemia 07/17/2016   Anxiety    Asthma    seasonal   Blood transfusion without reported diagnosis    Breast cancer (HCC)    right   Bronchitis    Colon polyps    CPAP (continuous positive airway pressure) dependence    Depression    Diabetes mellitus    borderline but takes metformin   Edema    Family history of breast cancer in first degree relative    Fibromyalgia    GERD (gastroesophageal reflux disease)    Hyperglycemia 03/05/2017   Hyperlipidemia 08/21/2016   no meds   Hyperparathyroidism    Hypertension    controlled by medications   Joint pain    Neuromuscular disorder (HCC)    Fibromyalgia   Obesity    Osteoarthritis    RA   PONV (postoperative nausea and vomiting)    occasional   Pre-diabetes    Sleep apnea    wears cpap   Viral meningitis    Vitamin D deficiency     Past Surgical History:  Procedure Laterality Date   ABDOMINAL HYSTERECTOMY  1998   ADENOIDECTOMY     BACK SURGERY     09/21/21   BREAST EXCISIONAL BIOPSY Left 1993   benign   BREAST SURGERY     Tran flap due to breast cancer   CESAREAN SECTION  1988   COLONOSCOPY  08/14/2011   HAND SURGERY     dog bite, right hand   HAND SURGERY     trauma, left hand   KNEE ARTHROSCOPY     bilateral   left knee replacement  2010   LUMBAR WOUND DEBRIDEMENT  N/A 10/07/2021   Procedure: LUMBAR WOUND DEBRIDEMENT/REVISION;  Surgeon: Coletta Memos, MD;  Location: [facility] OR;  Service: Neurosurgery;  Laterality: N/A;  3C/RM 21 to follow Dr Conchita Paris   MASTECTOMY  01/13/1994   Right breast   parathyroid resection     PARATHYROIDECTOMY     POLYPECTOMY     rectal abscess     SEPTOPLASTY  1980   SHOULDER ARTHROSCOPY Right 11/09/2015   Procedure: ARTHROSCOPY SHOULDER-acromioplasty, distal clavicle resection and debridement;  Surgeon: Marcene Corning, MD;  Location: [facility] OR;  Service: Orthopedics;  Laterality: Right;   shoulder arthroscopy     rotator cuff repair   TOE SURGERY Right    paronychia and adenoma removed   TONSILLECTOMY     TOTAL KNEE ARTHROPLASTY  06/18/2008   Daldorf   TUMOR REMOVAL  1982   , scalp    Patient Active Problem List   Diagnosis Date Noted   COVID-19 06/08/2022   Elevated sed rate 04/03/2022  Viral bronchitis 03/22/2022   Low back pain 01/11/2022   Drainage from wound 10/07/2021   Spondylolisthesis of lumbar region 09/21/2021   DDD (degenerative disc disease), cervical 05/04/2021   Neck pain 05/02/2021   Left arm pain 01/21/2021   Memory changes 04/19/2020   Educated about COVID-19 virus infection 04/19/2020   Peripheral neuropathy 04/19/2020   TMJ arthralgia 04/19/2020   Urinary incontinence 10/27/2019   Other peripheral vertigo, unspecified ear 10/27/2019   Vertigo 03/14/2018   RLS (restless legs syndrome) 06/07/2017   Right knee pain 12/06/2016   Arthralgia 12/03/2016   Hyperlipidemia 08/21/2016   Anemia 07/17/2016   Genetic testing 05/15/2016   Allergic urticaria 03/27/2016   Dermographia 03/27/2016   History of breast cancer 03/27/2016   Rheumatoid arthritis (HCC) 06/24/2015   Diverticulitis of colon without hemorrhage 09/21/2014   Asthma with acute exacerbation 07/14/2014   Cough 07/06/2014   Gout of big toe 03/10/2013   Preventative health care 10/08/2012   OSA (obstructive sleep apnea) 04/23/2012    Diabetes mellitus, type 2 (HCC) 12/10/2011   Diarrhea 09/04/2011   Personal history of colonic polyps - adenoma 08/14/2011   Fatigue 08/03/2011   Ear canal dryness 08/03/2011   Palpitations 09/21/2010   Morbid obesity (HCC) 09/21/2010   PAC (premature atrial contraction) 09/21/2010   ATTENTION DEFICIT DISORDER, INATTENTIVE TYPE 09/22/2009   Acute sinusitis 09/22/2009   VIRAL MENINGITIS, HX OF 04/29/2009   Insomnia 02/15/2009   Vitamin D deficiency 03/11/2008   Fibromyalgia 03/05/2008   HYPERPARATHYROIDISM, HX OF 08/06/2007   Allergic rhinitis 07/05/2007   Osteoarthritis 02/28/2007   Edema 11/01/2006   Depression with anxiety 06/25/2006   Essential hypertension 06/25/2006   Gastroesophageal reflux disease without esophagitis 06/25/2006     PCP: Bradd Canary, MD  REFERRING PROVIDER: Bradd Canary, MD  REFERRING DIAG: M54.41 (ICD-10-CM) - Right-sided low back pain with right-sided sciatica, unspecified chronicity  RATIONALE FOR EVALUATION AND TREATMENT: Rehabilitation  THERAPY DIAG:  Other low back pain  Muscle weakness (generalized)  Other abnormalities of gait and mobility   ONSET DATE: 09/21/21 - LUMBAR THREE-FOUR, LUMBAR FOUR-FIVE POSTERIOR LUMBAR INTERBODY FUSION   SUBJECTIVE:                                                                                                                                                                                           SUBJECTIVE STATEMENT:  Feels stiffer than normal,thinks her arthritis is acting up today, felt the arthritic pain in knee and back on Saturday with the cold weather. Tried different bending activities this past weekend and didn't feel as much pain compared to before.  PAIN:  Are you having pain? No and Yes: NPRS scale: 0/10 Pain location: L>R low back Pain description: "weakness", stiffness Aggravating factors: returning to standing after bending over Relieving factors: DN  PERTINENT HISTORY:   Diabetes, Osteoarthritis, Anxiety, GERD, R shoulder replacement (10/2015), L TKA, Back surgery (4/23)  PRECAUTIONS: None  WEIGHT BEARING RESTRICTIONS: No  FALLS:  Has patient fallen in last 6 months? No  LIVING ENVIRONMENT: Lives with: lives alone Lives in: House/apartment Stairs: No Has following equipment at home: Single point cane and elevated commode with bidet   OCCUPATION: PT - accounting, mostly deskwork  PLOF: Independent, Needs assistance with ADLs (difficulty with toilet hygiene), and Leisure: time with family.  PATIENT GOALS: "To be able to walk and be active again. To be able to clean myself after using the toilet. To be able to bend over an pick things up."   OBJECTIVE:   DIAGNOSTIC FINDINGS:  MRI Lumbar Spine w/o Contrast: 01/07/22  IMPRESSION: 1. Postsurgical changes from L3-L5 PLIF. Bone marrow signal changes within the spinous processes and bilateral lamina of L2, L3, and L4 raise the suspicion for osteomyelitis. 2. Previously described T2 hyperintense material along the right lateral and dorsal epidural space at the L2 and L2-3 levels is not seen on the current study. No IV contrast was administered on the current exam. No new epidural space abnormality is identified. 3. Similar degree of canal stenosis at L2-3.  CT Lumbar Spine w/o Contrast: 01/07/22 IMPRESSION: 1. No CT evidence for acute abnormality within the lumbar spine. 2. Postoperative changes from prior PLIF at L3-4 and L4-5. Mild subsidence at L3-4. No other hardware complication. 3. Underlying degenerative spondylosis and facet arthrosis, better appreciated and described on recent MRIs.  PATIENT SURVEYS:  Modified Oswestry:  19 / 50 = 38.0 %  FOTO: NT  SCREENING FOR RED FLAGS: Bowel or bladder incontinence: Yes: bladder (urge incontinence) Spinal tumors: No Cauda equina syndrome: No Compression fracture: No Abdominal aneurysm: No  COGNITION: Overall cognitive status: Within  functional limits for tasks assessed     SENSATION: WFL Intermittent brief R LE radicular pain  MUSCLE LENGTH: Hamstrings: mild tight B Quads: mod tight B  POSTURE: No Significant postural limitations  PALPATION: Lumbar paraspinals & glutes - muscle tension WNL w/o TTP  LUMBAR ROM:   AROM eval 04/26/22   Flexion <25% WNL- just weak coming back up [facility] - still has to walk hands back up legs  Extension 50% 25% WNL  Right lateral flexion 25% WNL WFL  Left lateral flexion 25% WNL WFL  Right rotation WNL  WNL  Left rotation WNL  WNL   (Blank rows = not tested)  LOWER EXTREMITY ROM:     Grossly WFL but accessory trunk lean in standing   LOWER EXTREMITY MMT:    MMT Right eval Left eval Right 04/26/22 Left 04/26/22 Right 05/17/22 Left 05/17/22 Right  06/16/23 Left  06/16/23  Hip flexion 2+ 3+ 4- 4- 4 4 4+ 5  Hip extension 2+ 2+ 4 4 4+ 4+ 5 5  Hip abduction 4- 4 4 4  4- 4 5 5   Hip adduction 2+ 3+ 4 4+ 4 4+ 5 5  Hip internal rotation 4+ 4+   5 5 5 5   Hip external rotation 4+ 4+   5 4+ 5 5  Knee flexion 5 5   5 5 5 5   Knee extension 5 5   5 5 5 5   Ankle dorsiflexion 5 4   5  4+ 5 5   (  Blank rows = not tested)  LUMBAR SPECIAL TESTS:  Straight leg raise test: Negative   GAIT: Distance walked: 60 feet, back and forth from exam room Assistive device utilized: Single point cane Level of assistance: Modified independence Comments: increased lumbar extension  TODAY'S TREATMENT:                                                                                                                              DATE:    06/19/22 THERAPEUTIC EXERCISE: to improve flexibility, strength and mobility.  Verbal and tactile cues throughout for technique.  Recumbent Bike L3x38min Supine Figure 4 Piriformis Stretch 2x30" each  Supine Marches Alt 2x10 RTB around knees  Supine Pelvic Tilt with Straight Leg Raise 2x10 Bilat  Scapular Retractions 2x10 BTB  Seated Rows BTB 2x10  Standing Low  Rows BTB 2x10  Lat pull blue OH door x 10 sitting with elbows bent Pallof Press BTB 2x10  Standing Trunk Rotation BTB at door 2x10   06/15/22  THERAPEUTIC EXERCISE: to improve flexibility, strength and mobility.  Verbal and tactile cues throughout for technique.  Recumbent Bike L3x7min Rows black TB x 10, Blue x 10 first Lat pull blue OH door x 10 sitting with elbows bent, Standing with straight arms Seated trunk ext black TB x 15  MANUAL THERAPY: To promote normalized muscle tension, improved flexibility, and reduced pain. Skilled palpation and monitoring of soft tissue during DN Trigger Point Dry-Needling  Treatment instructions: Expect mild to moderate muscle soreness. S/S of pneumothorax if dry needled over a lung field, and to seek immediate medical attention should they occur. Patient verbalized understanding of these instructions and education. Patient Consent Given: Yes Education handout provided: Previously provided Muscles treated: B glute maximus/medius/minimus Electrical stimulation performed: No Parameters: N/A Treatment response/outcome: Twitch Response Elicited and Palpable Increase in Muscle Length STM/DTM to bil gluteals   06/13/22  THERAPEUTIC EXERCISE: to improve flexibility, strength and mobility.  Verbal and tactile cues throughout for technique.  Recumbent Bike L2x26min hinge position  at wall with bil OH reach x 5 sec hold x 6 Hinge position with bil shoulder ext 5 sec hold x 5 Dead lift to table x 5, then with 5# wt x 5 Arms crossed over chest - good mornings x 5 (hinge) Lat pull 2x10, one set 20# then 25# Rows 25# 1 x 10 vertical and vertical  OH press 10# x 10 both handles   STM with foam roller demo'd for gluteals and lumbar.  PATIENT EDUCATION:  Education details: progress with PT, ongoing PT POC, HEP update, and HEP review Person educated: Patient Education method: Medical illustrator Education comprehension: verbalized understanding and  returned demonstration  HOME EXERCISE PROGRAM: Access Code: [email or web address] URL: [email or web address] Date: 06/15/2022 Prepared by: Raynelle Fanning  Exercises - Hooklying Hamstring Stretch with Strap  - 2-3 x daily - 7 x weekly - 3 reps - 30 sec hold - Standing Back Extension at Wall  - 2-3  x daily - 7 x weekly - 2 sets - 15 reps - 3 sec hold - Theatre manager with Chair and Counter Support  - 2-3 x daily - 7 x weekly - 3 reps - 30 sec hold - Supine Posterior Pelvic Tilt  - 1 x daily - 7 x weekly - 3 sets - 10 reps - Hooklying Clamshell with Resistance  - 1 x daily - 7 x weekly - 2 sets - 10 reps - Supine March with Resistance Band  - 1 x daily - 7 x weekly - 3 sets - 10 reps - Seated Hip Adduction Isometrics with Ball  - 1 x daily - 7 x weekly - 2 sets - 10 reps - 5 hold - Supine March with Alternating Leg Lifts  - 1 x daily - 7 x weekly - 3 sets - 10 reps - Supine Pelvic Tilt with Straight Leg Raise  - 1 x daily - 7 x weekly - 2 sets - 10 reps - Heel Toe Raises with Counter Support  - 1 x daily - 3-4 x weekly - 2 sets - 10 reps - 3 sec hold - Standing Hip Flexion with Resistance Loop  - 1 x daily - 3-4 x weekly - 2 sets - 10 reps - 3 sec hold - Hip Abduction with Resistance Loop  - 1 x daily - 3-4 x weekly - 2 sets - 10 reps - 3 sec hold - Hip Extension with Resistance Loop  - 1 x daily - 3-4 x weekly - 2 sets - 10 reps - 3 sec hold - Squat with [employer]  - 1 x daily - 7 x weekly - 2-3 sets - 5 reps - 5 sec  hold - Prone Hip Extension  - 1 x daily - 3 x weekly - 2 sets - 10 reps - Seated Hip Flexion Toward Target  - 1 x daily - 3 x weekly - 2 sets - 10 reps - Supine Hip Internal and External Rotation  - 1 x daily - 7 x weekly - 2 sets - 10 reps - Supine Figure 4 Piriformis Stretch  - 1 x daily - 7 x weekly - 2 sets - 30 sec hold - Seated Lat Pull Down with Resistance - Elbows Bent  - 1 x daily - 3 x weekly - 3 sets - 10 reps - Standing Lat Pull Down with Resistance -  Elbows Bent  - 1 x daily - 3 x weekly - 3 sets - 10 reps - Standing Shoulder Extension with Resistance  - 1 x daily - 3 x weekly - 3 sets - 10 reps - Seated Thoracic Lumbar Extension  - 1 x daily - 3 x weekly - 3 sets - 10 reps - Standing Anti-Rotation Press with Anchored Resistance  - 1 x daily - 7 x weekly - 2 sets - 10 reps - Standing Trunk Rotation with Resistance  - 1 x daily - 7 x weekly - 2 sets - 10 reps ASSESSMENT:  CLINICAL IMPRESSION: Pt came into today's session with increased stiffness which she attributes to the cold weekend and some activities she performed. The session started with a check in to see how her HEP was coming along, as well as informing her about the re-certification on Thursday. Pt acknowledged she felt that she had been progressing and was able to verbalize all the different activities she is now able to do since starting treatment. When introduced to the idea  of possible D/C on Thursday, pt seemed receptive to the idea of continuing her exercise at home by herself. Pt should be ready for D/C at the end of her POC on Thursday.    OBJECTIVE IMPAIRMENTS:  Abnormal gait, decreased activity tolerance, decreased balance, decreased endurance, decreased mobility, difficulty walking, decreased ROM, decreased strength, hypomobility, impaired perceived functional ability, impaired flexibility, improper body mechanics, and pain.   ACTIVITY LIMITATIONS: carrying, lifting, bending, sitting, standing, squatting, stairs, bed mobility, continence, bathing, toileting, dressing, and locomotion level  PARTICIPATION LIMITATIONS: cleaning, laundry, shopping, community activity, occupation, and attending festivals and ball games with grandchildren  PERSONAL FACTORS: Past/current experiences, Time since onset of injury/illness/exacerbation, and 3+ comorbidities: Diabetes, Osteoarthritis, Anxiety, GERD, R shoulder replacement (10/2015), and L TKA are also affecting patient's functional outcome.    REHAB POTENTIAL: Good  CLINICAL DECISION MAKING: Evolving/moderate complexity  EVALUATION COMPLEXITY: Moderate   GOALS: Goals reviewed with patient? Yes  SHORT TERM GOALS: Target date: 04/24/2022  Pt will perform HEP independently to demonstrate understanding of activity. Baseline: Goal status: MET 04/10/22  2.  Pt will have a 25% reduction in pain during mobility to improve everyday activity. Baseline:  Goal status: MET 04/10/22 - Pt reports 100% improvement.  3.  Pt will have a 25% increase in lumbar mobility to perform ADLs such as toileting and bathing. Baseline:  Goal status: MET 04/26/22  4.  Pt will improve strength of proximal hip muscles to >/= 3/5 to increase ability to stand from a chair and walk.  Baseline:  Goal status: MET 04/26/22  LONG TERM GOALS: Target date: 05/22/22, extended 06/21/2022  Pt will perform HEP independently to demonstrate understanding of activity. Baseline:  Goal status: PARTIALLY MET 05/17/22 - Met for current HEP  2.  Pt will have 75% reduction in pain during mobility to improve everyday activity. Baseline:  Goal status: MET 05/17/22 - Pt reports 90% improvement in pain.   3. Pt will have a 75% increase in lumbar mobility to perform ADLs such as toileting and bathing. Baseline:  Goal status: MET 06/07/22   4.  Pt will improve strength of proximal hip muscles to >/= 4+/5 to increase ability to stand from a chair and walk.  Baseline:  Goal status: MET 06/15/22  5.  Pt will improve modified Oswestry to 26% to show increase in functional activity. Baseline:  19 / 50 = 38.0 % Goal status: MET  05/17/22 - modified Oswestry = 9 / 50 = 18.0%  6.  Pt will perform STS with modified independence using single point cane to demonstrate independence and ability to stand from chair.  Baseline:  Goal status: MET  05/03/22  7.  Patient will be able to return to standing from forward lumbar flexion without increased pain or need to walk hands up  legs. Baseline:  Goal status: MET 06/15/22    PLAN:  PT FREQUENCY: 2x/week  PT DURATION: 4-5 weeks  PLANNED INTERVENTIONS: Therapeutic exercises, Therapeutic activity, Neuromuscular re-education, Balance training, Gait training, Patient/Family education, Self Care, Joint mobilization, Joint manipulation, Stair training, Dry Needling, Electrical stimulation, Spinal manipulation, Spinal mobilization, Cryotherapy, Moist heat, Manual therapy, and Re-evaluation.  PLAN FOR NEXT SESSION: Re-assessment & cleaning out HEP to finalize program    Suzette Battiest Romero-Perozo, Student-PT 06/19/2022, 10:36 AM    PHYSICAL THERAPY DISCHARGE SUMMARY  Visits from Start of Care: 17  Current functional level related to goals / functional outcomes:   Refer to above clinical impression and goal assessment for status as of last visit  on 06/19/22. Patient no showed for her final appt on 06/21/22, having confused the appt day/time, but stated she felt comfortable with her HEP and did not need to reschedule, therefore will proceed with discharge from PT for this episode.     Remaining deficits:   As above. Unable to formally assess status of final goal (LTG #1) due to failure to return to PT, but denying any further concerns.   Education / Equipment:   HEP   Patient agrees to discharge. Patient goals were mostly met. Patient is being discharged due to being pleased with the current functional level.  Marry Guan, PT, MPT 09/21/22, 4:24 PM  [facility] [address]  [address] [place], [place], [address] Phone: [phone]   Fax:  [phone]

## 2022-06-21 ENCOUNTER — Ambulatory Visit: Payer: Medicare Other | Admitting: Physical Therapy

## 2022-06-28 ENCOUNTER — Telehealth: Payer: Self-pay | Admitting: Family Medicine

## 2022-06-28 NOTE — Telephone Encounter (Signed)
Unable to reach patient - LM on VM with CB# [phone]

## 2022-06-28 NOTE — Telephone Encounter (Signed)
Patient states that she has been having some nausea with Ozempic 0.'25mg'$  weekly. She has noticed weight loss and changes in appetite.  Recommended she can try lowering dose a little - go to 0.'25mg'$  and then click back 10 clicks to see if she can tolerate better.  Also reminded her to pay attention to when she feel full. If she continues to eat then she will likely feel nauseated.

## 2022-06-28 NOTE — Telephone Encounter (Signed)
Patient called requesting to speak to Tammy regarding her Ozempic. She stated she has questions regarding the medication. Please advise.

## 2022-07-03 ENCOUNTER — Ambulatory Visit (INDEPENDENT_AMBULATORY_CARE_PROVIDER_SITE_OTHER): Payer: Medicare Other | Admitting: Family Medicine

## 2022-07-03 VITALS — BP 124/82 | HR 101 | Temp 97.3°F | Ht 66.0 in | Wt 282.0 lb

## 2022-07-03 DIAGNOSIS — E1140 Type 2 diabetes mellitus with diabetic neuropathy, unspecified: Secondary | ICD-10-CM

## 2022-07-03 DIAGNOSIS — J0100 Acute maxillary sinusitis, unspecified: Secondary | ICD-10-CM | POA: Diagnosis not present

## 2022-07-03 MED ORDER — DOXYCYCLINE HYCLATE 100 MG PO TABS: 100.0000 mg | ORAL_TABLET | Freq: Two times a day (BID) | ORAL | 0 refills | Status: DC

## 2022-07-03 NOTE — Assessment & Plan Note (Signed)
Symptoms and exam consistent wiht sinusitis. I will treat with a course of doxycycline (patient penicllin allergic). Follow-up as needed.

## 2022-07-03 NOTE — Progress Notes (Signed)
[facility] [address] [place] [address] Dept: [phone] Dept Fax: [phone]  Office Visit  Subjective:    Patient ID: [name], female    DOB: [birth year], 67 y.o..   MRN: [id number]  Chief Complaint  Patient presents with   Acute Visit    C/o still having ST, sinus drainage since having covid on 06/08/22. Has been taking OTC sinus medications.     History of Present Illness:  Patient is in today for complaining of a 3 week history of sinus congestion and pain, with associated sore throat. She has been using OTC sinus medicines. She was seen on 06/08/2022 and tested positive for COVID-19.  She took a course of Paxlovid. She is concerned that the sinus symptoms are persistent. She does note increased pain in her sinuses with bending over.  [name] also notes she was prescribed Ozempic for management of her diabetes and her weight. She had noted some significant nausea with this. She states she was prescribed thins by a nurse (?NP). She was advised to reduce her dose to 0.15 mg per week. She wasn't sure if this was safe for her to be on. She denies nay abdominal/back pain associated with the medicine and she had not had vomiting.  Past Medical History: Patient Active Problem List   Diagnosis Date Noted   COVID-19 06/08/2022   Elevated sed rate 04/03/2022   Viral bronchitis 03/22/2022   Low back pain 01/11/2022   Drainage from wound 10/07/2021   Spondylolisthesis of lumbar region 09/21/2021   DDD (degenerative disc disease), cervical 05/04/2021   Neck pain 05/02/2021   Left arm pain 01/21/2021   Memory changes 04/19/2020   Educated about COVID-19 virus infection 04/19/2020   Peripheral neuropathy 04/19/2020   TMJ arthralgia 04/19/2020   Urinary incontinence 10/27/2019   Other peripheral vertigo, unspecified ear 10/27/2019   Vertigo 03/14/2018   RLS (restless legs syndrome) 06/07/2017   Right knee  pain 12/06/2016   Arthralgia 12/03/2016   Hyperlipidemia 08/21/2016   Anemia 07/17/2016   Genetic testing 05/15/2016   Allergic urticaria 03/27/2016   Dermographia 03/27/2016   History of breast cancer 03/27/2016   Rheumatoid arthritis ([place]) 06/24/2015   Diverticulitis of colon without hemorrhage 09/21/2014   Asthma with acute exacerbation 07/14/2014   Cough 07/06/2014   Gout of big toe 03/10/2013   Preventative health care 10/08/2012   OSA (obstructive sleep apnea) 04/23/2012   Diabetes mellitus, type 2 ([place]) 12/10/2011   Diarrhea 09/04/2011   Personal history of colonic polyps - adenoma 08/14/2011   Fatigue 08/03/2011   Ear canal dryness 08/03/2011   Palpitations 09/21/2010   Morbid obesity ([place]) 09/21/2010   PAC (premature atrial contraction) 09/21/2010   ATTENTION DEFICIT DISORDER, INATTENTIVE TYPE 09/22/2009   Acute sinusitis 09/22/2009   VIRAL MENINGITIS, HX OF 04/29/2009   Insomnia 02/15/2009   Vitamin D deficiency 03/11/2008   Fibromyalgia 03/05/2008   HYPERPARATHYROIDISM, HX OF 08/06/2007   Allergic rhinitis 07/05/2007   Osteoarthritis 02/28/2007   Edema 11/01/2006   Depression with anxiety 06/25/2006   Essential hypertension 06/25/2006   Gastroesophageal reflux disease without esophagitis 06/25/2006   Past Surgical History:  Procedure Laterality Date   ABDOMINAL HYSTERECTOMY  1998   ADENOIDECTOMY     BACK SURGERY     09/21/21   BREAST EXCISIONAL BIOPSY Left 1993   benign   BREAST SURGERY     Tran flap due to breast cancer   [place]  COLONOSCOPY  08/14/2011   HAND SURGERY     dog bite, right hand   HAND SURGERY     trauma, left hand   KNEE ARTHROSCOPY     bilateral   left knee replacement  2010   LUMBAR WOUND DEBRIDEMENT N/A 10/07/2021   Procedure: LUMBAR WOUND DEBRIDEMENT/REVISION;  Surgeon: Ashok Pall, MD;  Location: [facility];  Service: Neurosurgery;  Laterality: N/A;  3C/RM 21 to follow Dr Kathyrn Sheriff   MASTECTOMY  01/13/1994   Right  breast   parathyroid resection     PARATHYROIDECTOMY     POLYPECTOMY     rectal abscess     SEPTOPLASTY  1980   SHOULDER ARTHROSCOPY Right 11/09/2015   Procedure: ARTHROSCOPY SHOULDER-acromioplasty, distal clavicle resection and debridement;  Surgeon: Melrose Nakayama, MD;  Location: [facility];  Service: Orthopedics;  Laterality: Right;   shoulder arthroscopy     rotator cuff repair   TOE SURGERY Right    paronychia and adenoma removed   TONSILLECTOMY     TOTAL KNEE ARTHROPLASTY  06/18/2008   [place]   TUMOR REMOVAL  1982   , scalp   Family History  Problem Relation Age of Onset   Emphysema Father    Lung cancer Father        lung ca dx 90   Other Father        prostate issues   Diabetes Father    Breast cancer Maternal Aunt        dx late 32s   Colon cancer Maternal Aunt        dx 71s   Colon polyps Maternal Aunt    Prostate cancer Maternal Grandfather 15       d. 85y   Heart disease Paternal Grandfather    Heart attack Paternal Grandfather        d. 50y   Diabetes Paternal Grandfather    Asthma Daughter        "seasonal"   Arthritis Maternal Grandmother    Aneurysm Maternal Grandmother        d. brain aneurysm at 48   Arthritis Mother    Colon polyps Mother    Hypertension Mother    Sleep apnea Mother    Multiple sclerosis Sister    Breast cancer Sister 52       L IDC and DCIS; ER/PR+, Her2-   Fibrocystic breast disease Sister    Arthritis/Rheumatoid Sister    Breast cancer Sister    Cancer Maternal Uncle        d. mouth cancer at younger age; smoker   Other Paternal Uncle        muscle issues - couldn't walk or talk; d. 20y   Cancer Maternal Aunt        lymphoma, dx 76s   Ovarian cancer Maternal Aunt        dx 35s; d. late 75s   Lung cancer Maternal Uncle        d. 36y; former smoker   Throat cancer Cousin        maternal 1st cousin; smoker   Breast cancer Cousin 59       maternal 1st cousin   Other Paternal Uncle        prostate issues   Breast cancer  Cousin        paternal 1st cousin dx late 4s   Throat cancer Cousin        maternal 1st cousin; used SL tobacco   Prostate cancer Cousin  maternal 1st cousin dx 51s   Esophageal cancer Neg Hx    Rectal cancer Neg Hx    Stomach cancer Neg Hx    Outpatient Medications Prior to Visit  Medication Sig Dispense Refill   acetaminophen (TYLENOL) 650 MG CR tablet Take 1,300 mg by mouth every 8 (eight) hours as needed for pain.     albuterol (VENTOLIN HFA) 108 (90 Base) MCG/ACT inhaler Inhale 2 puffs into the lungs every 6 (six) hours as needed for wheezing or shortness of breath. 8 g 0   calcium carbonate (OSCAL) 1500 (600 Ca) MG TABS tablet Take 1,500 mg by mouth 2 (two) times daily with a meal.     famotidine (PEPCID) 40 MG tablet Take 1 tablet (40 mg total) by mouth daily. 30 tablet 5   furosemide (LASIX) 40 MG tablet TAKE 1 TABLET BY MOUTH  TWICE DAILY (Patient taking differently: Take 40 mg by mouth daily.) 180 tablet 3   hydrochlorothiazide (MICROZIDE) 12.5 MG capsule TAKE 1 CAPSULE BY MOUTH DAILY (Patient taking differently: Take 12.5 mg by mouth daily.) 90 capsule 3   metFORMIN (GLUCOPHAGE) 500 MG tablet TAKE 1 TABLET BY MOUTH  TWICE DAILY WITH A MEAL (Patient taking differently: Take 500 mg by mouth 2 (two) times daily with a meal.) 180 tablet 3   Multiple Vitamins-Minerals (MULTIVITAMIN & MINERAL PO) Take 1 tablet by mouth daily.     promethazine-dextromethorphan (PROMETHAZINE-DM) 6.25-15 MG/5ML syrup Take 5 mLs by mouth 4 (four) times daily as needed for cough. 118 mL 0   telmisartan (MICARDIS) 20 MG tablet TAKE 1 TABLET BY MOUTH DAILY (Patient taking differently: Take 20 mg by mouth daily.) 90 tablet 3   cholecalciferol (VITAMIN D3) 25 MCG (1000 UNIT) tablet Take 1,000 Units by mouth daily. (Patient not taking: Reported on 07/03/2022)     Cyanocobalamin (B-12) 1000 MCG CAPS Take 1 capsule by mouth daily at 6 (six) AM. (Patient not taking: Reported on 06/08/2022)     Iron, Ferrous  Sulfate, 325 (65 Fe) MG TABS Take 325 mg by mouth every other day. (Patient not taking: Reported on 06/08/2022) 30 tablet    No facility-administered medications prior to visit.   Allergies  Allergen Reactions   Allopurinol Hives   Mucinex [Guaifenesin Er] Shortness Of Breath   Lyrica [Pregabalin] Swelling   Penicillins Hives and Other (See Comments)    Has patient had a PCN reaction causing immediate rash, facial/tongue/throat swelling, SOB or lightheadedness with hypotension: no Has patient had a PCN reaction causing severe rash involving mucus membranes or skin necrosis: no Has patient had a PCN reaction that required hospitalization no Has patient had a PCN reaction occurring within the last 10 years: no If all of the above answers are "NO", then may proceed with Cephalosporin use.    Prozac [Fluoxetine Hcl] Hives   Amitriptyline Other (See Comments)    "felt weird, fatigue, dizziness"   Lexapro [Escitalopram Oxalate]     Nausea and hypersalivation.      Objective:   Today's Vitals   07/03/22 1313  BP: 124/82  Pulse: (!) 101  Temp: (!) 97.3 F (36.3 C)  TempSrc: Temporal  SpO2: 93%  Weight: 282 lb (127.9 kg)  Height: '5\' 6"'$  (1.676 m)   Body mass index is 45.52 kg/m.   General: Well developed, well nourished. No acute distress. HEENT: Normocephalic, non-traumatic. Conjunctiva clear. External ears normal. Right EAC obscured by wax. Left   TM normal. Nose with moderate congestion and rhinorrheas. Pain in percussion  over the maxillary sinuses.   Mucous membranes moist. Oropharynx clear. Good dentition. Neck: Supple. No lymphadenopathy. No thyromegaly. Lungs: Clear to auscultation bilaterally. No wheezing, rales or rhonchi. Psych: Alert and oriented. Normal mood and affect.  Health Maintenance Due  Topic Date Due   Diabetic kidney evaluation - Urine ACR  Never done   Pneumonia Vaccine 16+ Years old (2 - PCV) 04/19/2021   COLONOSCOPY (Pts 45-54yr Insurance coverage will  need to be confirmed)  09/06/2021   FOOT EXAM  10/28/2021     Assessment & Plan:   Problem List Items Addressed This Visit       Respiratory   Acute sinusitis - Primary    Symptoms and exam consistent wiht sinusitis. I will treat with a course of doxycycline (patient penicllin allergic). Follow-up as needed.       Relevant Medications   doxycycline (VIBRA-TABS) 100 MG tablet     Endocrine   Diabetes mellitus, type 2 ([place]    Discussed risks associated with semaglutide use. It does not sound like she has been experiencing pancreatitis. I recommended she avoid fatty or fried foods while on this medicine. She should try injecting this int he thigh rather than the abdomen, as this seems to reduce GI side effects.        Return if symptoms worsen or fail to improve.   SHaydee Salter MD

## 2022-07-03 NOTE — Assessment & Plan Note (Signed)
Discussed risks associated with semaglutide use. It does not sound like she has been experiencing pancreatitis. I recommended she avoid fatty or fried foods while on this medicine. She should try injecting this int he thigh rather than the abdomen, as this seems to reduce GI side effects.

## 2022-07-13 ENCOUNTER — Emergency Department (HOSPITAL_BASED_OUTPATIENT_CLINIC_OR_DEPARTMENT_OTHER)
Admission: EM | Admit: 2022-07-13 | Discharge: 2022-07-14 | Disposition: A | Discharge status: Home / Self Care | Payer: Medicare Other | Attending: Emergency Medicine | Admitting: Emergency Medicine

## 2022-07-13 ENCOUNTER — Encounter (HOSPITAL_BASED_OUTPATIENT_CLINIC_OR_DEPARTMENT_OTHER): Payer: Self-pay | Admitting: Emergency Medicine

## 2022-07-13 ENCOUNTER — Emergency Department (HOSPITAL_BASED_OUTPATIENT_CLINIC_OR_DEPARTMENT_OTHER): Payer: Medicare Other

## 2022-07-13 ENCOUNTER — Other Ambulatory Visit: Payer: Self-pay

## 2022-07-13 DIAGNOSIS — K219 Gastro-esophageal reflux disease without esophagitis: Secondary | ICD-10-CM | POA: Insufficient documentation

## 2022-07-13 DIAGNOSIS — E119 Type 2 diabetes mellitus without complications: Secondary | ICD-10-CM | POA: Diagnosis not present

## 2022-07-13 DIAGNOSIS — Z79899 Other long term (current) drug therapy: Secondary | ICD-10-CM | POA: Insufficient documentation

## 2022-07-13 DIAGNOSIS — I10 Essential (primary) hypertension: Secondary | ICD-10-CM | POA: Diagnosis not present

## 2022-07-13 DIAGNOSIS — Z7984 Long term (current) use of oral hypoglycemic drugs: Secondary | ICD-10-CM | POA: Diagnosis not present

## 2022-07-13 DIAGNOSIS — I700 Atherosclerosis of aorta: Secondary | ICD-10-CM | POA: Diagnosis not present

## 2022-07-13 DIAGNOSIS — J45909 Unspecified asthma, uncomplicated: Secondary | ICD-10-CM | POA: Insufficient documentation

## 2022-07-13 DIAGNOSIS — R109 Unspecified abdominal pain: Secondary | ICD-10-CM | POA: Diagnosis not present

## 2022-07-13 DIAGNOSIS — R1010 Upper abdominal pain, unspecified: Secondary | ICD-10-CM

## 2022-07-13 DIAGNOSIS — R1011 Right upper quadrant pain: Secondary | ICD-10-CM | POA: Diagnosis not present

## 2022-07-13 LAB — COMPREHENSIVE METABOLIC PANEL
ALT: 15 U/L (ref 0–44)
AST: 23 U/L (ref 15–41)
Albumin: 3.7 g/dL (ref 3.5–5.0)
Alkaline Phosphatase: 65 U/L (ref 38–126)
Anion gap: 12 (ref 5–15)
BUN: 16 mg/dL (ref 8–23)
CO2: 27 mmol/L (ref 22–32)
Calcium: 9.2 mg/dL (ref 8.9–10.3)
Chloride: 92 mmol/L — ABNORMAL LOW (ref 98–111)
Creatinine, Ser: 0.88 mg/dL (ref 0.44–1.00)
GFR, Estimated: >60 mL/min (ref >60)
Glucose, Bld: 132 mg/dL — ABNORMAL HIGH (ref 70–99)
Potassium: 3.1 mmol/L — ABNORMAL LOW (ref 3.5–5.1)
Sodium: 131 mmol/L — ABNORMAL LOW (ref 135–145)
Total Bilirubin: 0.6 mg/dL (ref 0.3–1.2)
Total Protein: 8 g/dL (ref 6.5–8.1)

## 2022-07-13 LAB — URINALYSIS, ROUTINE W REFLEX MICROSCOPIC
Bilirubin Urine: NEGATIVE
Glucose, UA: NEGATIVE mg/dL
Hgb urine dipstick: NEGATIVE
Ketones, ur: NEGATIVE mg/dL
Nitrite: NEGATIVE
Protein, ur: NEGATIVE mg/dL
Specific Gravity, Urine: 1.015 (ref 1.005–1.030)
pH: 7 (ref 5.0–8.0)

## 2022-07-13 LAB — URINALYSIS, MICROSCOPIC (REFLEX)

## 2022-07-13 LAB — CBC
HCT: 34.5 % — ABNORMAL LOW (ref 36.0–46.0)
Hemoglobin: 11 g/dL — ABNORMAL LOW (ref 12.0–15.0)
MCH: 25.6 pg — ABNORMAL LOW (ref 26.0–34.0)
MCHC: 31.9 g/dL (ref 30.0–36.0)
MCV: 80.2 fL (ref 80.0–100.0)
Platelets: 401 10*3/uL — ABNORMAL HIGH (ref 150–400)
RBC: 4.3 MIL/uL (ref 3.87–5.11)
RDW: 16.7 % — ABNORMAL HIGH (ref 11.5–15.5)
WBC: 13.3 10*3/uL — ABNORMAL HIGH (ref 4.0–10.5)
nRBC: 0 % (ref 0.0–0.2)

## 2022-07-13 LAB — LIPASE, BLOOD: Lipase: 30 U/L (ref 11–51)

## 2022-07-13 LAB — TROPONIN I (HIGH SENSITIVITY): Troponin I (High Sensitivity): 3 ng/L (ref <18)

## 2022-07-13 MED ORDER — IOHEXOL 300 MG/ML  SOLN
125.0000 mL | Freq: Once | INTRAMUSCULAR | Status: AC | PRN
  Administered 2022-07-13: 125 mL via INTRAVENOUS

## 2022-07-13 MED ORDER — SODIUM CHLORIDE 0.9 % IV BOLUS
1000.0000 mL | Freq: Once | INTRAVENOUS | Status: AC
  Administered 2022-07-13: 1000 mL via INTRAVENOUS

## 2022-07-13 MED ORDER — ONDANSETRON 4 MG PO TBDP
4.0000 mg | ORAL_TABLET | Freq: Once | ORAL | Status: AC | PRN
  Administered 2022-07-13: 4 mg via ORAL
  Filled 2022-07-13: qty 1

## 2022-07-13 NOTE — ED Triage Notes (Signed)
Patient c/o mid abdominal pain since last week after she took her medication, states yesterday when she took it was the worst pain, sharp shooting pain. Reports nausea, denies any vomiting or diarrhea.

## 2022-07-13 NOTE — ED Notes (Signed)
Pt states she thinks her pain is related to recent doses of Ozempic. She is currently weaning off the medication.

## 2022-07-13 NOTE — ED Provider Notes (Signed)
[facility] Provider Note   CSN: [id number] Arrival date & time: 07/13/22  2030     History  Chief Complaint  Patient presents with   Abdominal Pain    [name] is a 67 y.o. female.  Patient is a 67 year old female with past medical history of hypertension, type 2 diabetes, obstructive sleep apnea, asthma, GERD.  Patient presenting today for evaluation of abdominal pain.  She describes a 1 week history of worsening pain to the right upper quadrant.  Pain is constant worse when she moves.  She denies any nausea or vomiting.  She denies any fevers or chills.  Pain seems to coincide with her starting Ozempic for her diabetes.  She denies any alleviating factors.  The history is provided by the patient.       Home Medications Prior to Admission medications   Medication Sig Start Date End Date Taking? Authorizing Provider  acetaminophen (TYLENOL) 650 MG CR tablet Take 1,300 mg by mouth every 8 (eight) hours as needed for pain.    [provider]  albuterol (VENTOLIN HFA) 108 (90 Base) MCG/ACT inhaler Inhale 2 puffs into the lungs every 6 (six) hours as needed for wheezing or shortness of breath. 03/22/22   Debbrah Alar, NP  calcium carbonate (OSCAL) 1500 (600 Ca) MG TABS tablet Take 1,500 mg by mouth 2 (two) times daily with a meal.    [provider]  cholecalciferol (VITAMIN D3) 25 MCG (1000 UNIT) tablet Take 1,000 Units by mouth daily. Patient not taking: Reported on 07/03/2022    [provider]  Cyanocobalamin (B-12) 1000 MCG CAPS Take 1 capsule by mouth daily at 6 (six) AM. Patient not taking: Reported on 06/08/2022 11/21/21   Debbrah Alar, NP  doxycycline (VIBRA-TABS) 100 MG tablet Take 1 tablet (100 mg total) by mouth 2 (two) times daily. 07/03/22   Haydee Salter, MD  famotidine (PEPCID) 40 MG tablet Take 1 tablet (40 mg total) by mouth daily. 12/11/18   Mosie Lukes, MD  furosemide  (LASIX) 40 MG tablet TAKE 1 TABLET BY MOUTH  TWICE DAILY Patient taking differently: Take 40 mg by mouth daily. 05/24/21   Mosie Lukes, MD  hydrochlorothiazide (MICROZIDE) 12.5 MG capsule TAKE 1 CAPSULE BY MOUTH DAILY Patient taking differently: Take 12.5 mg by mouth daily. 12/01/21   Mosie Lukes, MD  Iron, Ferrous Sulfate, 325 (65 Fe) MG TABS Take 325 mg by mouth every other day. Patient not taking: Reported on 06/08/2022 11/21/21   Debbrah Alar, NP  metFORMIN (GLUCOPHAGE) 500 MG tablet TAKE 1 TABLET BY MOUTH  TWICE DAILY WITH A MEAL Patient taking differently: Take 500 mg by mouth 2 (two) times daily with a meal. 12/20/21   Mosie Lukes, MD  Multiple Vitamins-Minerals (MULTIVITAMIN & MINERAL PO) Take 1 tablet by mouth daily.    [provider]  promethazine-dextromethorphan (PROMETHAZINE-DM) 6.25-15 MG/5ML syrup Take 5 mLs by mouth 4 (four) times daily as needed for cough. 06/08/22   McElwee, Lauren A, NP  telmisartan (MICARDIS) 20 MG tablet TAKE 1 TABLET BY MOUTH DAILY Patient taking differently: Take 20 mg by mouth daily. 12/01/21   Mosie Lukes, MD      Allergies    Allopurinol, Mucinex [guaifenesin er], Lyrica [pregabalin], Penicillins, Prozac [fluoxetine hcl], Amitriptyline, and Lexapro [escitalopram oxalate]    Review of Systems   Review of Systems  All other systems reviewed and are negative.   Physical Exam Updated  Vital Signs BP 125/79 (BP Location: Left Arm)   Pulse (!) 113   Temp 100 F (37.8 C) (Oral)   Resp 18   Ht 5' 6"$  (1.676 m)   Wt 127 kg   SpO2 95%   BMI 45.19 kg/m  Physical Exam Vitals and nursing note reviewed.  Constitutional:      General: She is not in acute distress.    Appearance: She is well-developed. She is not diaphoretic.  HENT:     Head: Normocephalic and atraumatic.  Cardiovascular:     Rate and Rhythm: Normal rate and regular rhythm.     Heart sounds: No murmur heard.    No friction rub. No gallop.  Pulmonary:      Effort: Pulmonary effort is normal. No respiratory distress.     Breath sounds: Normal breath sounds. No wheezing.  Abdominal:     General: Bowel sounds are normal. There is no distension.     Palpations: Abdomen is soft.     Tenderness: There is abdominal tenderness in the right upper quadrant and epigastric area. There is no left CVA tenderness, guarding or rebound.  Musculoskeletal:        General: Normal range of motion.     Cervical back: Normal range of motion and neck supple.  Skin:    General: Skin is warm and dry.  Neurological:     General: No focal deficit present.     Mental Status: She is alert and oriented to person, place, and time.     ED Results / Procedures / Treatments   Labs (all labs ordered are listed, but only abnormal results are displayed) Labs Reviewed  COMPREHENSIVE METABOLIC PANEL - Abnormal; Notable for the following components:      Result Value   Sodium 131 (*)    Potassium 3.1 (*)    Chloride 92 (*)    Glucose, Bld 132 (*)    All other components within normal limits  CBC - Abnormal; Notable for the following components:   WBC 13.3 (*)    Hemoglobin 11.0 (*)    HCT 34.5 (*)    MCH 25.6 (*)    RDW 16.7 (*)    Platelets 401 (*)    All other components within normal limits  URINALYSIS, ROUTINE W REFLEX MICROSCOPIC - Abnormal; Notable for the following components:   Leukocytes,Ua TRACE (*)    All other components within normal limits  URINALYSIS, MICROSCOPIC (REFLEX) - Abnormal; Notable for the following components:   Bacteria, UA FEW (*)    All other components within normal limits  LIPASE, BLOOD  TROPONIN I (HIGH SENSITIVITY)  TROPONIN I (HIGH SENSITIVITY)    EKG EKG Interpretation  Date/Time:  Thursday July 13 2022 20:53:15 EST Ventricular Rate:  117 PR Interval:  143 QRS Duration: 101 QT Interval:  338 QTC Calculation: 472 R Axis:   123 Text Interpretation: Sinus tachycardia Right axis deviation Low voltage, precordial leads  Minimal ST depression Baseline wander in lead(s) III V3 V4 V5 V6 Confirmed by Nanda Quinton [phone]) on 07/13/2022 9:31:37 PM  Radiology No results found.  Procedures Procedures    Medications Ordered in ED Medications  sodium chloride 0.9 % bolus 1,000 mL (has no administration in time range)  ondansetron (ZOFRAN-ODT) disintegrating tablet 4 mg (4 mg Oral Given 07/13/22 2050)    ED Course/ Medical Decision Making/ A&P  Patient is a 67 year old female presenting with complaints of abdominal pain as described in the HPI.  This came on  shortly after starting Ozempic for her diabetes.  Patient arrives here with stable vital signs and is afebrile.  Her physical examination reveals mild tenderness to the epigastric region and right upper quadrant, but no peritoneal signs.  Workup initiated including CBC, CMP, troponin, and lipase significant only for a mild leukocytosis with white count of 13.  Remainder of the laboratory studies basically unremarkable.  CT scan of the abdomen and pelvis obtained showing no acute intra-abdominal pathology.  There was ingested material within the stomach, however patient is not vomiting and I doubt related to obstruction.  At this point, she seems to be feeling better and is declining pain medication.  I feel as though she can safely be discharged with as needed return/follow-up.  Patient is convinced that symptoms began after starting Ozempic.  She plans to stop this medication and see if this helps.  Final Clinical Impression(s) / ED Diagnoses Final diagnoses:  None    Rx / DC Orders ED Discharge Orders     None         Veryl Speak, MD 07/14/22 [phone]

## 2022-07-14 ENCOUNTER — Telehealth: Payer: Self-pay

## 2022-07-14 LAB — TROPONIN I (HIGH SENSITIVITY): Troponin I (High Sensitivity): 3 ng/L (ref <18)

## 2022-07-14 NOTE — Discharge Instructions (Signed)
Follow-up with your primary doctor if symptoms or not improving in the next few days.  Return to the emergency department if you develop worsening pain, high fevers, bloody stools, or other new and concerning symptoms.

## 2022-07-14 NOTE — Telephone Encounter (Signed)
Initial Comment Caller states she was put on Ozempic and she is having burning/pain in her stomach area. Pt would like to know if she should be concerned. Translation No Nurse Assessment Nurse: Marcelline Deist, RN, Mickel Baas Date/Time Eilene Ghazi Time): 07/13/2022 7:59:27 PM Confirm and document reason for call. If symptomatic, describe symptoms. ---Caller states she was put on Ozempic and she is having burning/pain in her stomach area. Pt would like to know if she should be concerned. States it feels like the same sensation like a blister is forming. Rates the pain as 7/10. Injection was given in the thigh. When she moves it is worse. Pain is on the right side of abdomen. Does the patient have any new or worsening symptoms? ---Yes Will a triage be completed? ---Yes Related visit to physician within the last 2 weeks? ---No Does the PT have any chronic conditions? (i.e. diabetes, asthma, this includes High risk factors for pregnancy, etc.) ---No Is this a behavioral health or substance abuse call? ---No Guidelines Guideline Title Affirmed Question Affirmed Notes Nurse Date/Time Eilene Ghazi Time) Abdominal Pain - Female [1] MILDMODERATE pain AND [2] constant AND [3] present > 2 hours Marcelline Deist, RN, Mickel Baas 07/13/2022 8:01:44 PM PLEASE NOTE: All timestamps contained within this report are represented as [place] Standard Time. CONFIDENTIALTY NOTICE: This fax transmission is intended only for the addressee. It contains information that is legally privileged, confidential or otherwise protected from use or disclosure. If you are not the intended recipient, you are strictly prohibited from reviewing, disclosing, copying using or disseminating any of this information or taking any action in reliance on or regarding this information. If you have received this fax in error, please notify us immediately by telephone so that we can arrange for its return to [name]. Phone: [phone], Toll-Free: [phone], Fax:  [phone] Page: 2 of 2 Call Id: [id number] [place]. Time Eilene Ghazi Time) Disposition Final User 07/13/2022 8:03:59 PM See HCP within 4 Hours (or PCP triage) Yes Marcelline Deist, RN, Mickel Baas Final Disposition 07/13/2022 8:03:59 PM See HCP within 4 Hours (or PCP triage) Yes Marcelline Deist, RN, Elige Radon Disagree/Comply Comply Caller Understands Yes PreDisposition Call Doctor Care Advice Given Per Guideline SEE HCP (OR PCP TRIAGE) WITHIN 4 HOURS: NOTHING BY MOUTH: CALL BACK IF: * You become worse CARE ADVICE given per Abdominal Pain - Female (Adult) guideline. Referrals [facility] - ED

## 2022-07-14 NOTE — Telephone Encounter (Signed)
Pt seen at ED 

## 2022-07-20 DIAGNOSIS — D225 Melanocytic nevi of trunk: Secondary | ICD-10-CM | POA: Diagnosis not present

## 2022-07-20 DIAGNOSIS — L538 Other specified erythematous conditions: Secondary | ICD-10-CM | POA: Diagnosis not present

## 2022-07-20 DIAGNOSIS — Z7189 Other specified counseling: Secondary | ICD-10-CM | POA: Diagnosis not present

## 2022-07-20 DIAGNOSIS — L814 Other melanin hyperpigmentation: Secondary | ICD-10-CM | POA: Diagnosis not present

## 2022-07-20 DIAGNOSIS — L821 Other seborrheic keratosis: Secondary | ICD-10-CM | POA: Diagnosis not present

## 2022-07-20 DIAGNOSIS — L820 Inflamed seborrheic keratosis: Secondary | ICD-10-CM | POA: Diagnosis not present

## 2022-07-20 DIAGNOSIS — L738 Other specified follicular disorders: Secondary | ICD-10-CM | POA: Diagnosis not present

## 2022-07-20 DIAGNOSIS — L570 Actinic keratosis: Secondary | ICD-10-CM | POA: Diagnosis not present

## 2022-07-28 DIAGNOSIS — M1711 Unilateral primary osteoarthritis, right knee: Secondary | ICD-10-CM | POA: Diagnosis not present

## 2022-07-30 NOTE — Assessment & Plan Note (Signed)
Well controlled, no changes to meds. Encouraged heart healthy diet such as the DASH diet and exercise as tolerated.  °

## 2022-07-30 NOTE — Assessment & Plan Note (Signed)
hgba1c acceptable, minimize simple carbs. Increase exercise as tolerated. Continue current meds 

## 2022-07-30 NOTE — Assessment & Plan Note (Signed)
Encouraged DASH or MIND diet, decrease po intake and increase exercise as tolerated. Needs 7-8 hours of sleep nightly. Avoid trans fats, eat small, frequent meals every 4-5 hours with lean proteins, complex carbs and healthy fats. Minimize simple carbs, high fat foods and processed foods 

## 2022-07-30 NOTE — Assessment & Plan Note (Signed)
Encourage heart healthy diet such as MIND or DASH diet, increase exercise, avoid trans fats, simple carbohydrates and processed foods, consider a krill or fish or flaxseed oil cap daily.  Patient not takig statin

## 2022-07-30 NOTE — Assessment & Plan Note (Signed)
Supplement and monitor 

## 2022-07-31 ENCOUNTER — Ambulatory Visit (INDEPENDENT_AMBULATORY_CARE_PROVIDER_SITE_OTHER): Payer: Medicare Other | Admitting: Family Medicine

## 2022-07-31 VITALS — BP 126/82 | HR 118 | Temp 99.0°F | Resp 16 | Ht 66.0 in | Wt 283.8 lb

## 2022-07-31 DIAGNOSIS — R509 Fever, unspecified: Secondary | ICD-10-CM

## 2022-07-31 DIAGNOSIS — I10 Essential (primary) hypertension: Secondary | ICD-10-CM

## 2022-07-31 DIAGNOSIS — E782 Mixed hyperlipidemia: Secondary | ICD-10-CM | POA: Diagnosis not present

## 2022-07-31 DIAGNOSIS — R700 Elevated erythrocyte sedimentation rate: Secondary | ICD-10-CM

## 2022-07-31 DIAGNOSIS — E559 Vitamin D deficiency, unspecified: Secondary | ICD-10-CM

## 2022-07-31 DIAGNOSIS — E1140 Type 2 diabetes mellitus with diabetic neuropathy, unspecified: Secondary | ICD-10-CM | POA: Diagnosis not present

## 2022-07-31 MED ORDER — CIPROFLOXACIN HCL 250 MG PO TABS: 250.0000 mg | ORAL_TABLET | Freq: Two times a day (BID) | ORAL | 0 refills | Status: AC

## 2022-07-31 NOTE — Progress Notes (Addendum)
Subjective:   By signing my name below, I, [name], attest that this documentation has been prepared under the direction and in the presence of Mosie Lukes, MD. 07/31/2022   Patient ID: [name], female    DOB: [birth year], 67 y.o.   MRN: [id number]  Chief Complaint  Patient presents with   Follow-up    Follow up    HPI Patient is in today for an office visit.   ED She visited the ED on 07/13/2022 for evaluation of abdominal pain. She describes a 1 week history of worsening pain to the right upper quadrant. Pain is constant worse when she moves. Pain seems to coincide with her starting Ozempic for her diabetes. She denies any alleviating factors.  Urine She complains of having green urine and a productive cough. She is having issues producing urine. Her legs are swollen. She is taking 40 mg furosemide to alleviate the swelling. She has been drinking more water to manage her dehydration.    Low grade fever She has a history of a back infection. She complains of low grade intermittent fevers since she had the infection. She does not have an appetite. She also reports searching for her words more since the infection.   Ozempic She reports a blister on her stomach and stomach pain while taking ozempic.   Vitamins She takes a multivitamin daily PO. She takes Prevagen  for her brain health daily PO.   Past Medical History:  Diagnosis Date   Allergic rhinitis    Allergy    Anemia 07/17/2016   Anxiety    Asthma    seasonal   Blood transfusion without reported diagnosis    Breast cancer ([place])    right   Bronchitis    Colon polyps    CPAP (continuous positive airway pressure) dependence    Depression    Diabetes mellitus    borderline but takes metformin   Edema    Family history of breast cancer in first degree relative    Fibromyalgia    GERD (gastroesophageal reflux disease)    Hyperglycemia 03/05/2017   Hyperlipidemia 08/21/2016   no meds    Hyperparathyroidism    Hypertension    controlled by medications   Joint pain    Neuromuscular disorder ([place])    Fibromyalgia   Obesity    Osteoarthritis    RA   PONV (postoperative nausea and vomiting)    occasional   Pre-diabetes    Sleep apnea    wears cpap   Viral meningitis    Vitamin D deficiency     Past Surgical History:  Procedure Laterality Date   ABDOMINAL HYSTERECTOMY  1998   ADENOIDECTOMY     BACK SURGERY     09/21/21   BREAST EXCISIONAL BIOPSY Left 1993   benign   BREAST SURGERY     Tran flap due to breast cancer   CESAREAN SECTION  1988   COLONOSCOPY  08/14/2011   HAND SURGERY     dog bite, right hand   HAND SURGERY     trauma, left hand   KNEE ARTHROSCOPY     bilateral   left knee replacement  2010   LUMBAR WOUND DEBRIDEMENT N/A 10/07/2021   Procedure: LUMBAR WOUND DEBRIDEMENT/REVISION;  Surgeon: Ashok Pall, MD;  Location: [facility];  Service: Neurosurgery;  Laterality: N/A;  3C/RM 21 to follow Dr Kathyrn Sheriff   MASTECTOMY  01/13/1994   Right breast   parathyroid resection  PARATHYROIDECTOMY     POLYPECTOMY     rectal abscess     SEPTOPLASTY  1980   SHOULDER ARTHROSCOPY Right 11/09/2015   Procedure: ARTHROSCOPY SHOULDER-acromioplasty, distal clavicle resection and debridement;  Surgeon: Melrose Nakayama, MD;  Location: [facility];  Service: Orthopedics;  Laterality: Right;   shoulder arthroscopy     rotator cuff repair   TOE SURGERY Right    paronychia and adenoma removed   TONSILLECTOMY     TOTAL KNEE ARTHROPLASTY  06/18/2008   [facility]   TUMOR REMOVAL  1982   , scalp    Family History  Problem Relation Age of Onset   Emphysema Father    Lung cancer Father        lung ca dx 14   Other Father        prostate issues   Diabetes Father    Breast cancer Maternal Aunt        dx late 21s   Colon cancer Maternal Aunt        dx 20s   Colon polyps Maternal Aunt    Prostate cancer Maternal Grandfather 69       d. 85y   Heart disease Paternal  Grandfather    Heart attack Paternal Grandfather        d. 50y   Diabetes Paternal Grandfather    Asthma Daughter        "seasonal"   Arthritis Maternal Grandmother    Aneurysm Maternal Grandmother        d. brain aneurysm at 38   Arthritis Mother    Colon polyps Mother    Hypertension Mother    Sleep apnea Mother    Multiple sclerosis Sister    Breast cancer Sister 28       L IDC and DCIS; ER/PR+, Her2-   Fibrocystic breast disease Sister    Arthritis/Rheumatoid Sister    Breast cancer Sister    Cancer Maternal Uncle        d. mouth cancer at younger age; smoker   Other Paternal Uncle        muscle issues - couldn't walk or talk; d. 20y   Cancer Maternal Aunt        lymphoma, dx 69s   Ovarian cancer Maternal Aunt        dx 51s; d. late 33s   Lung cancer Maternal Uncle        d. 69y; former smoker   Throat cancer Cousin        maternal 1st cousin; smoker   Breast cancer Cousin 56       maternal 1st cousin   Other Paternal Uncle        prostate issues   Breast cancer Cousin        paternal 1st cousin dx late 51s   Throat cancer Cousin        maternal 1st cousin; used SL tobacco   Prostate cancer Cousin        maternal 1st cousin dx 89s   Esophageal cancer Neg Hx    Rectal cancer Neg Hx    Stomach cancer Neg Hx     Social History   Socioeconomic History   Marital status: Widowed    Spouse name: Not on file   Number of children: 1   Years of education: Not on file   Highest education level: 12th grade  Occupational History   Occupation: ACCOUNTING    Employer: [employer]  Tobacco Use  Smoking status: Never   Smokeless tobacco: Never  Vaping Use   Vaping Use: Never used  Substance and Sexual Activity   Alcohol use: Not Currently   Drug use: No   Sexual activity: Not Currently  Other Topics Concern   Not on file  Social History Narrative   Not on file   Social Determinants of Health   Financial Resource Strain: Low Risk  (02/21/2022)   Overall  Financial Resource Strain (CARDIA)    Difficulty of Paying Living Expenses: Not hard at all  Food Insecurity: No Food Insecurity (02/21/2022)   Hunger Vital Sign    Worried About Running Out of Food in the Last Year: Never true    Ran Out of Food in the Last Year: Never true  Transportation Needs: No Transportation Needs (02/21/2022)   PRAPARE - Hydrologist (Medical): No    Lack of Transportation (Non-Medical): No  Physical Activity: Inactive (02/21/2022)   Exercise Vital Sign    Days of Exercise per Week: 0 days    Minutes of Exercise per Session: 0 min  Stress: No Stress Concern Present (02/21/2022)   [place]    Feeling of Stress : Not at all  Social Connections: Moderately Isolated (02/21/2022)   Social Connection and Isolation Panel [NHANES]    Frequency of Communication with Friends and Family: More than three times a week    Frequency of Social Gatherings with Friends and Family: Once a week    Attends Religious Services: 1 to 4 times per year    Active Member of [employer] or Organizations: No    Attends Archivist Meetings: Never    Marital Status: Widowed  Intimate Partner Violence: Not At Risk (02/21/2022)   Humiliation, Afraid, Rape, and Kick questionnaire    Fear of Current or Ex-Partner: No    Emotionally Abused: No    Physically Abused: No    Sexually Abused: No    Outpatient Medications Prior to Visit  Medication Sig Dispense Refill   acetaminophen (TYLENOL) 650 MG CR tablet Take 1,300 mg by mouth every 8 (eight) hours as needed for pain.     albuterol (VENTOLIN HFA) 108 (90 Base) MCG/ACT inhaler Inhale 2 puffs into the lungs every 6 (six) hours as needed for wheezing or shortness of breath. 8 g 0   calcium carbonate (OSCAL) 1500 (600 Ca) MG TABS tablet Take 1,500 mg by mouth 2 (two) times daily with a meal.     cholecalciferol (VITAMIN D3) 25 MCG (1000 UNIT) tablet  Take 1,000 Units by mouth daily.     Cyanocobalamin (B-12) 1000 MCG CAPS Take 1 capsule by mouth daily at 6 (six) AM.     doxycycline (VIBRA-TABS) 100 MG tablet Take 1 tablet (100 mg total) by mouth 2 (two) times daily. 20 tablet 0   famotidine (PEPCID) 40 MG tablet Take 1 tablet (40 mg total) by mouth daily. 30 tablet 5   furosemide (LASIX) 40 MG tablet TAKE 1 TABLET BY MOUTH  TWICE DAILY (Patient taking differently: Take 40 mg by mouth daily.) 180 tablet 3   hydrochlorothiazide (MICROZIDE) 12.5 MG capsule TAKE 1 CAPSULE BY MOUTH DAILY (Patient taking differently: Take 12.5 mg by mouth daily.) 90 capsule 3   Iron, Ferrous Sulfate, 325 (65 Fe) MG TABS Take 325 mg by mouth every other day. 30 tablet    metFORMIN (GLUCOPHAGE) 500 MG tablet TAKE 1 TABLET BY MOUTH  TWICE  DAILY WITH A MEAL (Patient taking differently: Take 500 mg by mouth 2 (two) times daily with a meal.) 180 tablet 3   Multiple Vitamins-Minerals (MULTIVITAMIN & MINERAL PO) Take 1 tablet by mouth daily.     promethazine-dextromethorphan (PROMETHAZINE-DM) 6.25-15 MG/5ML syrup Take 5 mLs by mouth 4 (four) times daily as needed for cough. 118 mL 0   telmisartan (MICARDIS) 20 MG tablet TAKE 1 TABLET BY MOUTH DAILY (Patient taking differently: Take 20 mg by mouth daily.) 90 tablet 3   No facility-administered medications prior to visit.    Allergies  Allergen Reactions   Allopurinol Hives   Mucinex [Guaifenesin Er] Shortness Of Breath   Lyrica [Pregabalin] Swelling   Penicillins Hives and Other (See Comments)    Has patient had a PCN reaction causing immediate rash, facial/tongue/throat swelling, SOB or lightheadedness with hypotension: no Has patient had a PCN reaction causing severe rash involving mucus membranes or skin necrosis: no Has patient had a PCN reaction that required hospitalization no Has patient had a PCN reaction occurring within the last 10 years: no If all of the above answers are "NO", then may proceed with  Cephalosporin use.    Prozac [Fluoxetine Hcl] Hives   Amitriptyline Other (See Comments)    "felt weird, fatigue, dizziness"   Lexapro [Escitalopram Oxalate]     Nausea and hypersalivation.     Review of Systems  Respiratory:  Positive for cough.   Genitourinary:        (+) green urine       Objective:    Physical Exam Constitutional:      General: She is not in acute distress.    Appearance: Normal appearance.  HENT:     Head: Normocephalic and atraumatic.     Right Ear: External ear normal.     Left Ear: External ear normal.     Mouth/Throat:     Pharynx: Posterior oropharyngeal erythema (mild at the tonsilar pillar) present.  Eyes:     Extraocular Movements: Extraocular movements intact.     Pupils: Pupils are equal, round, and reactive to light.  Cardiovascular:     Rate and Rhythm: Normal rate and regular rhythm.     Heart sounds: Normal heart sounds. No murmur heard.    No gallop.  Pulmonary:     Effort: Pulmonary effort is normal. No respiratory distress.     Breath sounds: Normal breath sounds. No wheezing or rales.  Skin:    General: Skin is warm.  Neurological:     Mental Status: She is alert and oriented to person, place, and time.  Psychiatric:        Judgment: Judgment normal.     BP 126/82 (BP Location: Right Arm, Patient Position: Sitting, Cuff Size: Normal)   Pulse (!) 118   Temp 99 F (37.2 C) (Oral)   Resp 16   Ht '5\' 6"'$  (1.676 m)   Wt 283 lb 12.8 oz (128.7 kg)   SpO2 97%   BMI 45.81 kg/m  Wt Readings from Last 3 Encounters:  07/31/22 283 lb 12.8 oz (128.7 kg)  07/13/22 280 lb (127 kg)  07/03/22 282 lb (127.9 kg)       Assessment & Plan:  Type 2 diabetes mellitus with diabetic neuropathy, without long-term current use of insulin (HCC) Assessment & Plan: hgba1c acceptable, minimize simple carbs. Increase exercise as tolerated. Continue current meds   Orders: -     Hemoglobin A1c  Essential hypertension Assessment & Plan: Well  controlled,  no changes to meds. Encouraged heart healthy diet such as the DASH diet and exercise as tolerated.    Orders: -     CBC with Differential/Platelet -     Comprehensive metabolic panel -     TSH  Mixed hyperlipidemia Assessment & Plan: Encourage heart healthy diet such as MIND or DASH diet, increase exercise, avoid trans fats, simple carbohydrates and processed foods, consider a krill or fish or flaxseed oil cap daily.  Patient not takig statin  Orders: -     Lipid panel  Morbid obesity ([place]) Assessment & Plan: Encouraged DASH or MIND diet, decrease po intake and increase exercise as tolerated. Needs 7-8 hours of sleep nightly. Avoid trans fats, eat small, frequent meals every 4-5 hours with lean proteins, complex carbs and healthy fats. Minimize simple carbs, high fat foods and processed foods    Vitamin D deficiency Assessment & Plan: Supplement and monitor   Orders: -     VITAMIN D 25 Hydroxy (Vit-D Deficiency, Fractures)  Fever, unspecified fever cause -     Sedimentation rate -     B. burgdorfi antibodies -     Rocky mtn spotted fvr abs pnl(IgG+IgM) -     Ehrlichia antibody panel  Elevated sed rate Assessment & Plan: Has climbed again and her WBC remains up. She reports she has not felt well in many months. MRI July 2023 showed . Small right dorsal epidural collection at the L2 level is concerning for early epidural phlegmon/abscess. Repeat imaging in August 2023 showed . Previously described T2 hyperintense material along the right  lateral and dorsal epidural space at the L2 and L2-3 levels is not  seen on the current study. No IV contrast was administered on the  current exam. No new epidural space abnormality is identified.  But it also showed: Postsurgical changes from L3-L5 PLIF. Bone marrow signal changes  within the spinous processes and bilateral lamina of L2, L3, and L4  raise the suspicion for osteomyelitis.  If she is not marked improved after  antibiotics and with repeat lab work she will require repeat imaging and likely further referral   Other orders -     Ciprofloxacin HCl; Take 1 tablet (250 mg total) by mouth 2 (two) times daily for 7 days.  Dispense: 14 tablet; Refill: 0    I, Penni Homans, MD, personally preformed the services described in this documentation.  All medical record entries made by the scribe were at my direction and in my presence.  I have reviewed the chart and discharge instructions (if applicable) and agree that the record reflects my personal performance and is accurate and complete. 07/31/2022  Penni Homans, MD   Lacretia Leigh as a scribe for Penni Homans, MD.,have documented all relevant documentation on the behalf of Penni Homans, MD,as directed by  Penni Homans, MD while in the presence of Penni Homans, MD.

## 2022-07-31 NOTE — Patient Instructions (Addendum)
Mucinex twice daily and a probiotic daily  Fever, Adult     A fever is a high body temperature that is 100.58F (38C) or higher. Brief mild or moderate fevers generally have no lasting effects, and they often do not need treatment. Moderate or high fevers can feel uncomfortable and can sometimes be a sign of a serious problem. Fevers can also cause dehydration because the body may sweat, especially if the fever keeps coming back or lasts a long time. You can use a thermometer to check for a fever. Body temperature can change with: Age. Time of day. Where the temperature is taken, such as in the mouth, rectum, ear, under the arm, or on the forehead. Follow these instructions at home: Medicines Take over-the-counter and prescription medicines only as told by your health care provider. Follow instructions on how much medicine to take and how often. If you were prescribed antibiotics, take them as told by your provider. Do not stop using the antibiotic even if you start to feel better. General instructions Watch for any changes in your symptoms. Let your provider know about them. Rest as needed. Drink enough fluid to keep your pee (urine) pale yellow. This helps to prevent dehydration. Bathe or sponge bathe with room-temperature water to help lower your body temperature as needed. Do not use cold water. Do not use too many blankets or wear heavy clothes. Stay home from work and public places for at least 24 hours after your fever is gone. Your fever should be gone without having to use medicines. Contact a health care provider if: You have vomiting or diarrhea that does not get better. You cannot eat or drink without vomiting. You have pain when you pee (urinate). Your symptoms do not get better with treatment or you have new symptoms. You have a skin rash. You have signs of dehydration, such as: Dark pee, very little pee, or no pee. Cracked lips or dry mouth. Sunken  eyes. Sleepiness. Weakness. Get help right away if: You have shortness of breath or trouble breathing. You feel dizzy or you faint. You are confused and do not know the time of day, where you are, or who you are (disoriented). You have severe pain in your abdomen. These symptoms may be an emergency. Get help right away. Call 911. Do not wait to see if the symptoms will go away. Do not drive yourself to the hospital. This information is not intended to replace advice given to you by your health care provider. Make sure you discuss any questions you have with your health care provider. Document Revised: 02/14/2022 Document Reviewed: 02/14/2022 Elsevier Patient Education  [place].

## 2022-08-01 LAB — COMPREHENSIVE METABOLIC PANEL
ALT: 13 U/L (ref 0–35)
AST: 19 U/L (ref 0–37)
Albumin: 3.8 g/dL (ref 3.5–5.2)
Alkaline Phosphatase: 64 U/L (ref 39–117)
BUN: 15 mg/dL (ref 6–23)
CO2: 31 mEq/L (ref 19–32)
Calcium: 10.2 mg/dL (ref 8.4–10.5)
Chloride: 95 mEq/L — ABNORMAL LOW (ref 96–112)
Creatinine, Ser: 0.82 mg/dL (ref 0.40–1.20)
GFR: 73.7 mL/min (ref >60.00)
Glucose, Bld: 106 mg/dL — ABNORMAL HIGH (ref 70–99)
Potassium: 4.1 mEq/L (ref 3.5–5.1)
Sodium: 139 mEq/L (ref 135–145)
Total Bilirubin: 0.4 mg/dL (ref 0.2–1.2)
Total Protein: 7.6 g/dL (ref 6.0–8.3)

## 2022-08-01 LAB — CBC WITH DIFFERENTIAL/PLATELET
Basophils Absolute: 0.2 10*3/uL — ABNORMAL HIGH (ref 0.0–0.1)
Basophils Relative: 1.4 % (ref 0.0–3.0)
Eosinophils Absolute: 0.1 10*3/uL (ref 0.0–0.7)
Eosinophils Relative: 1 % (ref 0.0–5.0)
HCT: 33 % — ABNORMAL LOW (ref 36.0–46.0)
Hemoglobin: 10.5 g/dL — ABNORMAL LOW (ref 12.0–15.0)
Lymphocytes Relative: 25.5 % (ref 12.0–46.0)
Lymphs Abs: 2.8 10*3/uL (ref 0.7–4.0)
MCHC: 31.8 g/dL (ref 30.0–36.0)
MCV: 80.4 fl (ref 78.0–100.0)
Monocytes Absolute: 0.9 10*3/uL (ref 0.1–1.0)
Monocytes Relative: 8 % (ref 3.0–12.0)
Neutro Abs: 7 10*3/uL (ref 1.4–7.7)
Neutrophils Relative %: 64.1 % (ref 43.0–77.0)
Platelets: 403 10*3/uL — ABNORMAL HIGH (ref 150.0–400.0)
RBC: 4.1 Mil/uL (ref 3.87–5.11)
RDW: 18.1 % — ABNORMAL HIGH (ref 11.5–15.5)
WBC: 10.8 10*3/uL — ABNORMAL HIGH (ref 4.0–10.5)

## 2022-08-01 LAB — LIPID PANEL
Cholesterol: 138 mg/dL (ref 0–200)
HDL: 44.9 mg/dL (ref >39.00)
LDL Cholesterol: 58 mg/dL (ref 0–99)
NonHDL: 93.59
Total CHOL/HDL Ratio: 3
Triglycerides: 176 mg/dL — ABNORMAL HIGH (ref 0.0–149.0)
VLDL: 35.2 mg/dL (ref 0.0–40.0)

## 2022-08-01 LAB — TSH: TSH: 0.91 u[IU]/mL (ref 0.35–5.50)

## 2022-08-01 LAB — HEMOGLOBIN A1C: Hgb A1c MFr Bld: 6.5 % (ref 4.6–6.5)

## 2022-08-01 LAB — SEDIMENTATION RATE: Sed Rate: 76 mm/hr — ABNORMAL HIGH (ref 0–30)

## 2022-08-01 LAB — VITAMIN D 25 HYDROXY (VIT D DEFICIENCY, FRACTURES): VITD: 48.81 ng/mL (ref 30.00–100.00)

## 2022-08-02 ENCOUNTER — Other Ambulatory Visit: Payer: Self-pay

## 2022-08-02 DIAGNOSIS — I10 Essential (primary) hypertension: Secondary | ICD-10-CM

## 2022-08-04 DIAGNOSIS — M1711 Unilateral primary osteoarthritis, right knee: Secondary | ICD-10-CM | POA: Diagnosis not present

## 2022-08-04 LAB — EHRLICHIA ANTIBODY PANEL
E. CHAFFEENSIS AB IGG: <1:64 {titer}
E. CHAFFEENSIS AB IGM: <1:20 {titer}

## 2022-08-04 LAB — B. BURGDORFI ANTIBODIES: B burgdorferi Ab IgG+IgM: <0.9 index

## 2022-08-04 LAB — ROCKY MTN SPOTTED FVR ABS PNL(IGG+IGM)
RMSF IgG: NOT DETECTED
RMSF IgM: NOT DETECTED

## 2022-08-04 NOTE — Assessment & Plan Note (Addendum)
Has climbed again and her WBC remains up. She reports she has not felt well in many months. MRI July 2023 showed . Small right dorsal epidural collection at the L2 level is concerning for early epidural phlegmon/abscess. Repeat imaging in August 2023 showed . Previously described T2 hyperintense material along the right  lateral and dorsal epidural space at the L2 and L2-3 levels is not  seen on the current study. No IV contrast was administered on the  current exam. No new epidural space abnormality is identified.  But it also showed: Postsurgical changes from L3-L5 PLIF. Bone marrow signal changes  within the spinous processes and bilateral lamina of L2, L3, and L4  raise the suspicion for osteomyelitis.  If she is not marked improved after antibiotics and with repeat lab work she will require repeat imaging and likely further referral

## 2022-08-07 ENCOUNTER — Encounter: Payer: Self-pay | Admitting: Family Medicine

## 2022-08-09 ENCOUNTER — Other Ambulatory Visit (INDEPENDENT_AMBULATORY_CARE_PROVIDER_SITE_OTHER): Payer: Medicare Other

## 2022-08-09 DIAGNOSIS — I10 Essential (primary) hypertension: Secondary | ICD-10-CM | POA: Diagnosis not present

## 2022-08-09 LAB — CBC WITH DIFFERENTIAL/PLATELET
Basophils Absolute: 0 10*3/uL (ref 0.0–0.1)
Basophils Relative: 0.5 % (ref 0.0–3.0)
Eosinophils Absolute: 0.1 10*3/uL (ref 0.0–0.7)
Eosinophils Relative: 1.6 % (ref 0.0–5.0)
HCT: 33.8 % — ABNORMAL LOW (ref 36.0–46.0)
Hemoglobin: 10.8 g/dL — ABNORMAL LOW (ref 12.0–15.0)
Lymphocytes Relative: 30 % (ref 12.0–46.0)
Lymphs Abs: 2.6 10*3/uL (ref 0.7–4.0)
MCHC: 31.9 g/dL (ref 30.0–36.0)
MCV: 80.9 fl (ref 78.0–100.0)
Monocytes Absolute: 0.6 10*3/uL (ref 0.1–1.0)
Monocytes Relative: 7.5 % (ref 3.0–12.0)
Neutro Abs: 5.2 10*3/uL (ref 1.4–7.7)
Neutrophils Relative %: 60.4 % (ref 43.0–77.0)
Platelets: 366 10*3/uL (ref 150.0–400.0)
RBC: 4.18 Mil/uL (ref 3.87–5.11)
RDW: 18.4 % — ABNORMAL HIGH (ref 11.5–15.5)
WBC: 8.6 10*3/uL (ref 4.0–10.5)

## 2022-08-09 LAB — SEDIMENTATION RATE: Sed Rate: 47 mm/hr — ABNORMAL HIGH (ref 0–30)

## 2022-08-11 DIAGNOSIS — M1711 Unilateral primary osteoarthritis, right knee: Secondary | ICD-10-CM | POA: Diagnosis not present

## 2022-08-14 ENCOUNTER — Other Ambulatory Visit: Payer: Medicare Other

## 2022-08-16 ENCOUNTER — Telehealth: Payer: Self-pay | Admitting: Family Medicine

## 2022-08-16 DIAGNOSIS — R700 Elevated erythrocyte sedimentation rate: Secondary | ICD-10-CM

## 2022-08-16 DIAGNOSIS — M7989 Other specified soft tissue disorders: Secondary | ICD-10-CM | POA: Insufficient documentation

## 2022-08-16 LAB — HM DIABETES EYE EXAM

## 2022-08-16 NOTE — Assessment & Plan Note (Signed)
hgba1c acceptable, minimize simple carbs. Increase exercise as tolerated. Continue current meds 

## 2022-08-16 NOTE — Telephone Encounter (Signed)
Patient said she was supposed to come in next Wednesday for repeat labs but lab orders have not been placed. Appt scheduled 08/23/22

## 2022-08-16 NOTE — Assessment & Plan Note (Signed)
Increase leafy greens, consider increased lean red meat and using cast iron cookware. Continue to monitor, report any concerns 

## 2022-08-16 NOTE — Assessment & Plan Note (Signed)
Well controlled, no changes to meds. Encouraged heart healthy diet such as the DASH diet and exercise as tolerated.  °

## 2022-08-17 ENCOUNTER — Ambulatory Visit (INDEPENDENT_AMBULATORY_CARE_PROVIDER_SITE_OTHER): Payer: Medicare Other | Admitting: Family Medicine

## 2022-08-17 ENCOUNTER — Encounter: Payer: Self-pay | Admitting: Family Medicine

## 2022-08-17 VITALS — BP 124/80 | HR 91 | Temp 98.7°F | Resp 16 | Ht 66.0 in | Wt 282.2 lb

## 2022-08-17 DIAGNOSIS — R609 Edema, unspecified: Secondary | ICD-10-CM | POA: Diagnosis not present

## 2022-08-17 DIAGNOSIS — I10 Essential (primary) hypertension: Secondary | ICD-10-CM

## 2022-08-17 DIAGNOSIS — R700 Elevated erythrocyte sedimentation rate: Secondary | ICD-10-CM

## 2022-08-17 DIAGNOSIS — M5441 Lumbago with sciatica, right side: Secondary | ICD-10-CM

## 2022-08-17 DIAGNOSIS — M791 Myalgia, unspecified site: Secondary | ICD-10-CM | POA: Diagnosis not present

## 2022-08-17 DIAGNOSIS — E1140 Type 2 diabetes mellitus with diabetic neuropathy, unspecified: Secondary | ICD-10-CM | POA: Diagnosis not present

## 2022-08-17 DIAGNOSIS — D649 Anemia, unspecified: Secondary | ICD-10-CM | POA: Diagnosis not present

## 2022-08-17 DIAGNOSIS — M7989 Other specified soft tissue disorders: Secondary | ICD-10-CM | POA: Diagnosis not present

## 2022-08-17 LAB — CBC WITH DIFFERENTIAL/PLATELET
Basophils Absolute: 0.1 10*3/uL (ref 0.0–0.1)
Basophils Relative: 0.9 % (ref 0.0–3.0)
Eosinophils Absolute: 0.1 10*3/uL (ref 0.0–0.7)
Eosinophils Relative: 1.1 % (ref 0.0–5.0)
HCT: 34 % — ABNORMAL LOW (ref 36.0–46.0)
Hemoglobin: 10.8 g/dL — ABNORMAL LOW (ref 12.0–15.0)
Lymphocytes Relative: 23.6 % (ref 12.0–46.0)
Lymphs Abs: 2 10*3/uL (ref 0.7–4.0)
MCHC: 31.8 g/dL (ref 30.0–36.0)
MCV: 81.3 fl (ref 78.0–100.0)
Monocytes Absolute: 0.8 10*3/uL (ref 0.1–1.0)
Monocytes Relative: 9.3 % (ref 3.0–12.0)
Neutro Abs: 5.6 10*3/uL (ref 1.4–7.7)
Neutrophils Relative %: 65.1 % (ref 43.0–77.0)
Platelets: 367 10*3/uL (ref 150.0–400.0)
RBC: 4.19 Mil/uL (ref 3.87–5.11)
RDW: 18.4 % — ABNORMAL HIGH (ref 11.5–15.5)
WBC: 8.7 10*3/uL (ref 4.0–10.5)

## 2022-08-17 LAB — COMPREHENSIVE METABOLIC PANEL
ALT: 16 U/L (ref 0–35)
AST: 19 U/L (ref 0–37)
Albumin: 4.2 g/dL (ref 3.5–5.2)
Alkaline Phosphatase: 75 U/L (ref 39–117)
BUN: 14 mg/dL (ref 6–23)
CO2: 30 mEq/L (ref 19–32)
Calcium: 10 mg/dL (ref 8.4–10.5)
Chloride: 98 mEq/L (ref 96–112)
Creatinine, Ser: 0.86 mg/dL (ref 0.40–1.20)
GFR: 69.59 mL/min (ref >60.00)
Glucose, Bld: 128 mg/dL — ABNORMAL HIGH (ref 70–99)
Potassium: 3.9 mEq/L (ref 3.5–5.1)
Sodium: 139 mEq/L (ref 135–145)
Total Bilirubin: 0.5 mg/dL (ref 0.2–1.2)
Total Protein: 7.3 g/dL (ref 6.0–8.3)

## 2022-08-17 LAB — URIC ACID: Uric Acid, Serum: 8.3 mg/dL — ABNORMAL HIGH (ref 2.4–7.0)

## 2022-08-17 LAB — MAGNESIUM: Magnesium: 2 mg/dL (ref 1.5–2.5)

## 2022-08-17 LAB — SEDIMENTATION RATE: Sed Rate: 64 mm/hr — ABNORMAL HIGH (ref 0–30)

## 2022-08-17 LAB — HIGH SENSITIVITY CRP: CRP, High Sensitivity: 10.09 mg/L — ABNORMAL HIGH (ref 0.000–5.000)

## 2022-08-17 MED ORDER — COLCHICINE 0.6 MG PO TABS: 0.6000 mg | ORAL_TABLET | Freq: Two times a day (BID) | ORAL | 2 refills | Status: DC | PRN

## 2022-08-17 NOTE — Patient Instructions (Signed)
Gout  Gout is a condition that causes painful swelling of the joints. Gout is a type of inflammation of the joints (arthritis). This condition is caused by having too much uric acid in the body. Uric acid is a chemical that forms when the body breaks down substances called purines. Purines are important for building body proteins. When the body has too much uric acid, sharp crystals can form and build up inside the joints. This causes pain and swelling. Gout attacks can happen quickly and may be very painful (acute gout). Over time, the attacks can affect more joints and become more frequent (chronic gout). Gout can also cause uric acid to build up under the skin and inside the kidneys. What are the causes? This condition is caused by too much uric acid in your blood. This can happen because: Your kidneys do not remove enough uric acid from your blood. This is the most common cause. Your body makes too much uric acid. This can happen with some cancers and cancer treatments. It can also occur if your body is breaking down too many red blood cells (hemolytic anemia). You eat too many foods that are high in purines. These foods include organ meats and some seafood. Alcohol, especially beer, is also high in purines. A gout attack may be triggered by trauma or stress. What increases the risk? The following factors may make you more likely to develop this condition: Having a family history of gout. Being female and middle-aged. Being female and having gone through menopause. Taking certain medicines, including aspirin, cyclosporine, diuretics, levodopa, and niacin. Having an organ transplant. Having certain conditions, such as: Being obese. Lead poisoning. Kidney disease. A skin condition called psoriasis. Other factors include: Losing weight too quickly. Being dehydrated. Frequently drinking alcohol, especially beer. Frequently drinking beverages that are sweetened with a type of sugar called  fructose. What are the signs or symptoms? An attack of acute gout happens quickly. It usually occurs in just one joint. The most common place is the big toe. Attacks often start at night. Other joints that may be affected include joints of the feet, ankle, knee, fingers, wrist, or elbow. Symptoms of this condition may include: Severe pain. Warmth. Swelling. Stiffness. Tenderness. The affected joint may be very painful to touch. Shiny, red, or purple skin. Chills and fever. Chronic gout may cause symptoms more frequently. More joints may be involved. You may also have white or yellow lumps (tophi) on your hands or feet or in other areas near your joints. How is this diagnosed? This condition is diagnosed based on your symptoms, your medical history, and a physical exam. You may have tests, such as: Blood tests to measure uric acid levels. Removal of joint fluid with a thin needle (aspiration) to look for uric acid crystals. X-rays to look for joint damage. How is this treated? Treatment for this condition has two phases: treating an acute attack and preventing future attacks. Acute gout treatment may include medicines to reduce pain and swelling, including: NSAIDs, such as ibuprofen. Steroids. These are strong anti-inflammatory medicines that can be taken by mouth (orally) or injected into a joint. Colchicine. This medicine relieves pain and swelling when it is taken soon after an attack. It can be given by mouth or through an IV. Preventive treatment may include: Daily use of smaller doses of NSAIDs or colchicine. Use of a medicine that reduces uric acid levels in your blood, such as allopurinol. Changes to your diet. You may need to see   a dietitian about what to eat and drink to prevent gout. Follow these instructions at home: During a gout attack  If directed, put ice on the affected area. To do this: Put ice in a plastic bag. Place a towel between your skin and the bag. Leave the  ice on for 20 minutes, 2-3 times a day. Remove the ice if your skin turns bright red. This is very important. If you cannot feel pain, heat, or cold, you have a greater risk of damage to the area. Raise (elevate) the affected joint above the level of your heart as often as possible. Rest the joint as much as possible. If the affected joint is in your leg, you may be given crutches to use. Follow instructions from your health care provider about eating or drinking restrictions. Avoiding future gout attacks Follow a low-purine diet as told by your dietitian or health care provider. Avoid foods and drinks that are high in purines, including liver, kidney, anchovies, asparagus, herring, mushrooms, mussels, and beer. Maintain a healthy weight or lose weight if you are overweight. If you want to lose weight, talk with your health care provider. Do not lose weight too quickly. Start or maintain an exercise program as told by your health care provider. Eating and drinking Avoid drinking beverages that contain fructose. Drink enough fluids to keep your urine pale yellow. If you drink alcohol: Limit how much you have to: 0-1 drink a day for women who are not pregnant. 0-2 drinks a day for men. Know how much alcohol is in a drink. In the [place] one drink equals one 12 oz bottle of beer (355 mL), one 5 oz glass of wine (148 mL), or one 1 oz glass of hard liquor (44 mL). General instructions Take over-the-counter and prescription medicines only as told by your health care provider. Ask your health care provider if the medicine prescribed to you requires you to avoid driving or using machinery. Return to your normal activities as told by your health care provider. Ask your health care provider what activities are safe for you. Keep all follow-up visits. This is important. Where to find more information National Institutes of Health: www.niams.nih.gov Contact a health care provider if you have: Another  gout attack. Continuing symptoms of a gout attack after 10 days of treatment. Side effects from your medicines. Chills or a fever. Burning pain when you urinate. Pain in your lower back or abdomen. Get help right away if you: Have severe or uncontrolled pain. Cannot urinate. Summary Gout is painful swelling of the joints caused by having too much uric acid in the body. The most common site for gout to occur is in the big toe, but it can affect other joints in the body. Medicines and dietary changes can help to prevent and treat gout attacks. This information is not intended to replace advice given to you by your health care provider. Make sure you discuss any questions you have with your health care provider. Document Revised: 02/16/2021 Document Reviewed: 02/16/2021 Elsevier Patient Education  2023 Elsevier Inc.  

## 2022-08-17 NOTE — Assessment & Plan Note (Signed)
Stable and helped by some recent massage. Continue to monitor labs closely given history

## 2022-08-17 NOTE — Assessment & Plan Note (Signed)
She has been drinking large amounts of liquid IV. She will stop that. Has only been using Lasix once a day increase to 2 x a day for next week. Elvate feet above heart as much as possible and use compression hose knee highs on in am off in pm

## 2022-08-17 NOTE — Assessment & Plan Note (Signed)
Sudden onset without injury on Saturday, red, hot swollen, likely gout. 9 of 10 pain initially even to light touch now down to 5 of 10. Started on Colchicine 0.6 bid x 1 week and check uric acid

## 2022-08-17 NOTE — Assessment & Plan Note (Signed)
Continue to monitor labs today

## 2022-08-17 NOTE — Progress Notes (Signed)
Subjective:   By signing my name below, I, [name], attest that this documentation has been prepared under the direction and in the presence of Mosie Lukes, MD., 08/17/2022.   Patient ID: [name], female    DOB: August 12, 1955, 67 y.o.   MRN: [id number]  Chief Complaint  Patient presents with   Toe Injury    Toe Swollen   HPI Patient is in today for an office visit.  Gout (Right Great Toe) Patient reports that this past Saturday,  08/12/2022, her right great toe suddenly became erythematic, swollen, and warm. She rates the pain upon onset as 9/10 but states that it is now 5-6/10. She further states that she was unable to walk for two days, but is able to walk today, though it hurts. Today the toe remains swollen and red, though she says this has improved since onset. She continues taking Furosemide 40 mg and Hydrochlorothiazide 12.5 mg once daily to manage hypertension. Additionally, the quality of her chronic back pain remains the same. She denies CP/palpitations/SOB/HA/fever/chills/GI or GU symptoms but states that she is experiencing tingling and myalgias in the right leg and right great toe. She has been increasing her water intake and adding liquid IV.  Past Medical History:  Diagnosis Date   Allergic rhinitis    Allergy    Anemia 07/17/2016   Anxiety    Asthma    seasonal   Blood transfusion without reported diagnosis    Breast cancer ([place])    right   Bronchitis    Colon polyps    CPAP (continuous positive airway pressure) dependence    Depression    Diabetes mellitus    borderline but takes metformin   Edema    Family history of breast cancer in first degree relative    Fibromyalgia    GERD (gastroesophageal reflux disease)    Hyperglycemia 03/05/2017   Hyperlipidemia 08/21/2016   no meds   Hyperparathyroidism    Hypertension    controlled by medications   Joint pain    Neuromuscular disorder ([place])    Fibromyalgia   Obesity    Osteoarthritis     RA   PONV (postoperative nausea and vomiting)    occasional   Pre-diabetes    Sleep apnea    wears cpap   Viral meningitis    Vitamin D deficiency     Past Surgical History:  Procedure Laterality Date   ABDOMINAL HYSTERECTOMY  1998   ADENOIDECTOMY     BACK SURGERY     09/21/21   BREAST EXCISIONAL BIOPSY Left 1993   benign   BREAST SURGERY     Tran flap due to breast cancer   CESAREAN SECTION  1988   COLONOSCOPY  08/14/2011   HAND SURGERY     dog bite, right hand   HAND SURGERY     trauma, left hand   KNEE ARTHROSCOPY     bilateral   left knee replacement  2010   LUMBAR WOUND DEBRIDEMENT N/A 10/07/2021   Procedure: LUMBAR WOUND DEBRIDEMENT/REVISION;  Surgeon: Ashok Pall, MD;  Location: [facility];  Service: Neurosurgery;  Laterality: N/A;  3C/RM 21 to follow Dr Kathyrn Sheriff   MASTECTOMY  01/13/1994   Right breast   parathyroid resection     PARATHYROIDECTOMY     POLYPECTOMY     rectal abscess     SEPTOPLASTY  1980   SHOULDER ARTHROSCOPY Right 11/09/2015   Procedure: ARTHROSCOPY SHOULDER-acromioplasty, distal clavicle resection and debridement;  Surgeon: Collier Salina  Rhona Raider, MD;  Location: [facility];  Service: Orthopedics;  Laterality: Right;   shoulder arthroscopy     rotator cuff repair   TOE SURGERY Right    paronychia and adenoma removed   TONSILLECTOMY     TOTAL KNEE ARTHROPLASTY  06/18/2008   [facility]   TUMOR REMOVAL  1982   , scalp    Family History  Problem Relation Age of Onset   Emphysema Father    Lung cancer Father        lung ca dx 55   Other Father        prostate issues   Diabetes Father    Breast cancer Maternal Aunt        dx late 57s   Colon cancer Maternal Aunt        dx 73s   Colon polyps Maternal Aunt    Prostate cancer Maternal Grandfather 80       d. 85y   Heart disease Paternal Grandfather    Heart attack Paternal Grandfather        d. 50y   Diabetes Paternal Grandfather    Asthma Daughter        "seasonal"   Arthritis Maternal  Grandmother    Aneurysm Maternal Grandmother        d. brain aneurysm at 6   Arthritis Mother    Colon polyps Mother    Hypertension Mother    Sleep apnea Mother    Multiple sclerosis Sister    Breast cancer Sister 40       L IDC and DCIS; ER/PR+, Her2-   Fibrocystic breast disease Sister    Arthritis/Rheumatoid Sister    Breast cancer Sister    Cancer Maternal Uncle        d. mouth cancer at younger age; smoker   Other Paternal Uncle        muscle issues - couldn't walk or talk; d. 20y   Cancer Maternal Aunt        lymphoma, dx 59s   Ovarian cancer Maternal Aunt        dx 19s; d. late 5s   Lung cancer Maternal Uncle        d. 84y; former smoker   Throat cancer Cousin        maternal 1st cousin; smoker   Breast cancer Cousin 62       maternal 1st cousin   Other Paternal Uncle        prostate issues   Breast cancer Cousin        paternal 1st cousin dx late 28s   Throat cancer Cousin        maternal 1st cousin; used SL tobacco   Prostate cancer Cousin        maternal 1st cousin dx 33s   Esophageal cancer Neg Hx    Rectal cancer Neg Hx    Stomach cancer Neg Hx     Social History   Socioeconomic History   Marital status: Widowed    Spouse name: Not on file   Number of children: 1   Years of education: Not on file   Highest education level: 12th grade  Occupational History   Occupation: ACCOUNTING    Employer: [employer]  Tobacco Use   Smoking status: Never   Smokeless tobacco: Never  Vaping Use   Vaping Use: Never used  Substance and Sexual Activity   Alcohol use: Not Currently   Drug use: No   Sexual activity: Not  Currently  Other Topics Concern   Not on file  Social History Narrative   Not on file   Social Determinants of Health   Financial Resource Strain: Low Risk  (02/21/2022)   Overall Financial Resource Strain (CARDIA)    Difficulty of Paying Living Expenses: Not hard at all  Food Insecurity: No Food Insecurity (02/21/2022)   Hunger Vital  Sign    Worried About Running Out of Food in the Last Year: Never true    Ran Out of Food in the Last Year: Never true  Transportation Needs: No Transportation Needs (02/21/2022)   PRAPARE - Hydrologist (Medical): No    Lack of Transportation (Non-Medical): No  Physical Activity: Inactive (02/21/2022)   Exercise Vital Sign    Days of Exercise per Week: 0 days    Minutes of Exercise per Session: 0 min  Stress: No Stress Concern Present (02/21/2022)   [place]    Feeling of Stress : Not at all  Social Connections: Moderately Isolated (02/21/2022)   Social Connection and Isolation Panel [NHANES]    Frequency of Communication with Friends and Family: More than three times a week    Frequency of Social Gatherings with Friends and Family: Once a week    Attends Religious Services: 1 to 4 times per year    Active Member of [employer] or Organizations: No    Attends Archivist Meetings: Never    Marital Status: Widowed  Intimate Partner Violence: Not At Risk (02/21/2022)   Humiliation, Afraid, Rape, and Kick questionnaire    Fear of Current or Ex-Partner: No    Emotionally Abused: No    Physically Abused: No    Sexually Abused: No    Outpatient Medications Prior to Visit  Medication Sig Dispense Refill   acetaminophen (TYLENOL) 650 MG CR tablet Take 1,300 mg by mouth every 8 (eight) hours as needed for pain.     albuterol (VENTOLIN HFA) 108 (90 Base) MCG/ACT inhaler Inhale 2 puffs into the lungs every 6 (six) hours as needed for wheezing or shortness of breath. 8 g 0   calcium carbonate (OSCAL) 1500 (600 Ca) MG TABS tablet Take 1,500 mg by mouth 2 (two) times daily with a meal.     cholecalciferol (VITAMIN D3) 25 MCG (1000 UNIT) tablet Take 1,000 Units by mouth daily.     Cyanocobalamin (B-12) 1000 MCG CAPS Take 1 capsule by mouth daily at 6 (six) AM.     doxycycline (VIBRA-TABS) 100 MG  tablet Take 1 tablet (100 mg total) by mouth 2 (two) times daily. 20 tablet 0   famotidine (PEPCID) 40 MG tablet Take 1 tablet (40 mg total) by mouth daily. 30 tablet 5   furosemide (LASIX) 40 MG tablet TAKE 1 TABLET BY MOUTH  TWICE DAILY (Patient taking differently: Take 40 mg by mouth daily.) 180 tablet 3   hydrochlorothiazide (MICROZIDE) 12.5 MG capsule TAKE 1 CAPSULE BY MOUTH DAILY (Patient taking differently: Take 12.5 mg by mouth daily.) 90 capsule 3   Iron, Ferrous Sulfate, 325 (65 Fe) MG TABS Take 325 mg by mouth every other day. 30 tablet    metFORMIN (GLUCOPHAGE) 500 MG tablet TAKE 1 TABLET BY MOUTH  TWICE DAILY WITH A MEAL (Patient taking differently: Take 500 mg by mouth 2 (two) times daily with a meal.) 180 tablet 3   Multiple Vitamins-Minerals (MULTIVITAMIN & MINERAL PO) Take 1 tablet by mouth daily.  promethazine-dextromethorphan (PROMETHAZINE-DM) 6.25-15 MG/5ML syrup Take 5 mLs by mouth 4 (four) times daily as needed for cough. 118 mL 0   telmisartan (MICARDIS) 20 MG tablet TAKE 1 TABLET BY MOUTH DAILY (Patient taking differently: Take 20 mg by mouth daily.) 90 tablet 3   No facility-administered medications prior to visit.    Allergies  Allergen Reactions   Allopurinol Hives   Mucinex [Guaifenesin Er] Shortness Of Breath   Lyrica [Pregabalin] Swelling   Penicillins Hives and Other (See Comments)    Has patient had a PCN reaction causing immediate rash, facial/tongue/throat swelling, SOB or lightheadedness with hypotension: no Has patient had a PCN reaction causing severe rash involving mucus membranes or skin necrosis: no Has patient had a PCN reaction that required hospitalization no Has patient had a PCN reaction occurring within the last 10 years: no If all of the above answers are "NO", then may proceed with Cephalosporin use.    Prozac [Fluoxetine Hcl] Hives   Amitriptyline Other (See Comments)    "felt weird, fatigue, dizziness"   Lexapro [Escitalopram Oxalate]      Nausea and hypersalivation.     Review of Systems  Constitutional:  Negative for chills and fever.  HENT:  Negative for congestion.   Respiratory:  Negative for shortness of breath.   Cardiovascular:  Negative for chest pain and palpitations.  Gastrointestinal:  Negative for abdominal pain, blood in stool, constipation, diarrhea, nausea and vomiting.  Genitourinary:  Negative for dysuria, frequency, hematuria and urgency.  Musculoskeletal:  Positive for myalgias (right leg and right great toe).       (+) right great toe pain.  Skin:           Neurological:  Positive for tingling (right great toe). Negative for headaches.       Objective:    Physical Exam Constitutional:      General: She is not in acute distress.    Appearance: Normal appearance. She is normal weight. She is not ill-appearing.  HENT:     Head: Normocephalic and atraumatic.     Right Ear: External ear normal.     Left Ear: External ear normal.     Nose: Nose normal.     Mouth/Throat:     Mouth: Mucous membranes are moist.     Pharynx: Oropharynx is clear.  Eyes:     General:        Right eye: No discharge.        Left eye: No discharge.     Extraocular Movements: Extraocular movements intact.     Conjunctiva/sclera: Conjunctivae normal.     Pupils: Pupils are equal, round, and reactive to light.  Cardiovascular:     Rate and Rhythm: Normal rate and regular rhythm.     Pulses: Normal pulses.     Heart sounds: Normal heart sounds. No murmur heard.    No gallop.  Pulmonary:     Effort: Pulmonary effort is normal. No respiratory distress.     Breath sounds: Normal breath sounds. No wheezing or rales.  Abdominal:     General: Bowel sounds are normal.     Palpations: Abdomen is soft.     Tenderness: There is no abdominal tenderness. There is no guarding.  Musculoskeletal:        General: Normal range of motion.     Cervical back: Normal range of motion.     Right lower leg: No edema.     Left lower  leg: No edema.  Feet:  Comments: Right great toe is mildly swollen and erythematic. Skin:    General: Skin is warm and dry.  Neurological:     Mental Status: She is alert and oriented to person, place, and time.  Psychiatric:        Mood and Affect: Mood normal.        Behavior: Behavior normal.        Judgment: Judgment normal.     BP 124/80 (BP Location: Right Arm, Patient Position: Sitting, Cuff Size: Normal)   Pulse 91   Temp 98.7 F (37.1 C) (Oral)   Resp 16   Ht 5\' 6"  (1.676 m)   Wt 282 lb 3.2 oz (128 kg)   SpO2 99%   BMI 45.55 kg/m  Wt Readings from Last 3 Encounters:  08/17/22 282 lb 3.2 oz (128 kg)  07/31/22 283 lb 12.8 oz (128.7 kg)  07/13/22 280 lb (127 kg)    Diabetic Foot Exam - Simple   No data filed    Lab Results  Component Value Date   WBC 8.6 08/09/2022   HGB 10.8 (L) 08/09/2022   HCT 33.8 (L) 08/09/2022   PLT 366.0 08/09/2022   GLUCOSE 106 (H) 07/31/2022   CHOL 138 07/31/2022   TRIG 176.0 (H) 07/31/2022   HDL 44.90 07/31/2022   LDLDIRECT 110.0 08/21/2016   LDLCALC 58 07/31/2022   ALT 13 07/31/2022   AST 19 07/31/2022   NA 139 07/31/2022   K 4.1 07/31/2022   CL 95 (L) 07/31/2022   CREATININE 0.82 07/31/2022   BUN 15 07/31/2022   CO2 31 07/31/2022   TSH 0.91 07/31/2022   INR 2.0 (H) 06/21/2008   HGBA1C 6.5 07/31/2022    Lab Results  Component Value Date   TSH 0.91 07/31/2022   Lab Results  Component Value Date   WBC 8.6 08/09/2022   HGB 10.8 (L) 08/09/2022   HCT 33.8 (L) 08/09/2022   MCV 80.9 08/09/2022   PLT 366.0 08/09/2022   Lab Results  Component Value Date   NA 139 07/31/2022   K 4.1 07/31/2022   CO2 31 07/31/2022   GLUCOSE 106 (H) 07/31/2022   BUN 15 07/31/2022   CREATININE 0.82 07/31/2022   BILITOT 0.4 07/31/2022   ALKPHOS 64 07/31/2022   AST 19 07/31/2022   ALT 13 07/31/2022   PROT 7.6 07/31/2022   ALBUMIN 3.8 07/31/2022   CALCIUM 10.2 07/31/2022   ANIONGAP 12 07/13/2022   GFR 73.70 07/31/2022   Lab  Results  Component Value Date   CHOL 138 07/31/2022   Lab Results  Component Value Date   HDL 44.90 07/31/2022   Lab Results  Component Value Date   LDLCALC 58 07/31/2022   Lab Results  Component Value Date   TRIG 176.0 (H) 07/31/2022   Lab Results  Component Value Date   CHOLHDL 3 07/31/2022   Lab Results  Component Value Date   HGBA1C 6.5 07/31/2022      Assessment & Plan:  Labs: Blood work today will check iron and uric acid.   Right Great Toe Pain: Furosemide 40 mg increased to twice daily. Colchicine 0.6 mg prescribed: take twice daily for one week.  Problem List Items Addressed This Visit     Anemia    Increase leafy greens, consider increased lean red meat and using cast iron cookware. Continue to monitor, report any concerns       Relevant Orders   Iron, TIBC and Ferritin Panel   Diabetes mellitus, type 2 (  [place])    hgba1c acceptable, minimize simple carbs. Increase exercise as tolerated. Continue current meds       Edema    She has been drinking large amounts of liquid IV. She will stop that. Has only been using Lasix once a day increase to 2 x a day for next week. Elvate feet above heart as much as possible and use compression hose knee highs on in am off in pm      Elevated sed rate    Continue to monitor labs today      Essential hypertension    Well controlled, no changes to meds. Encouraged heart healthy diet such as the DASH diet and exercise as tolerated.        Relevant Orders   CBC w/Diff   Comp Met (CMET)   Low back pain    Stable and helped by some recent massage. Continue to monitor labs closely given history      Toe swelling - Primary    Sudden onset without injury on Saturday, red, hot swollen, likely gout. 9 of 10 pain initially even to light touch now down to 5 of 10. Started on Colchicine 0.6 bid x 1 week and check uric acid      Relevant Orders   CBC w/Diff   Sedimentation rate   CRP High sensitivity   Comp Met (CMET)    Uric acid   Other Visit Diagnoses     Myalgia       Relevant Orders   Magnesium      Meds ordered this encounter  Medications   colchicine 0.6 MG tablet    Sig: Take 1 tablet (0.6 mg total) by mouth 2 (two) times daily as needed.    Dispense:  60 tablet    Refill:  2   I, Penni Homans, MD, personally preformed the services described in this documentation.  All medical record entries made by the scribe were at my direction and in my presence.  I have reviewed the chart and discharge instructions (if applicable) and agree that the record reflects my personal performance and is accurate and complete. 08/17/2022  I,Mohammed Iqbal,acting as a scribe for Penni Homans, MD.,have documented all relevant documentation on the behalf of Penni Homans, MD,as directed by  Penni Homans, MD while in the presence of Penni Homans, MD.  Penni Homans, MD

## 2022-08-17 NOTE — Telephone Encounter (Signed)
Please advise 

## 2022-08-18 ENCOUNTER — Other Ambulatory Visit: Payer: Self-pay

## 2022-08-18 LAB — IRON,TIBC AND FERRITIN PANEL
%SAT: 9 % (calc) — ABNORMAL LOW (ref 16–45)
Ferritin: 30 ng/mL (ref 16–288)
Iron: 34 ug/dL — ABNORMAL LOW (ref 45–160)
TIBC: 396 mcg/dL (calc) (ref 250–450)

## 2022-08-18 MED ORDER — ALLOPURINOL 100 MG PO TABS: 100.0000 mg | ORAL_TABLET | Freq: Every day | ORAL | 6 refills | Status: DC

## 2022-08-18 NOTE — Addendum Note (Signed)
Addended byDamita Dunnings D on: 08/18/2022 07:51 AM   Modules accepted: Orders

## 2022-08-18 NOTE — Telephone Encounter (Signed)
Orders placed.

## 2022-08-19 DIAGNOSIS — E119 Type 2 diabetes mellitus without complications: Secondary | ICD-10-CM | POA: Diagnosis not present

## 2022-08-19 DIAGNOSIS — H43393 Other vitreous opacities, bilateral: Secondary | ICD-10-CM | POA: Diagnosis not present

## 2022-08-21 ENCOUNTER — Encounter: Payer: Self-pay | Admitting: Family Medicine

## 2022-08-23 ENCOUNTER — Other Ambulatory Visit (INDEPENDENT_AMBULATORY_CARE_PROVIDER_SITE_OTHER): Payer: Medicare Other

## 2022-08-23 DIAGNOSIS — R700 Elevated erythrocyte sedimentation rate: Secondary | ICD-10-CM | POA: Diagnosis not present

## 2022-08-23 LAB — CBC WITH DIFFERENTIAL/PLATELET
Basophils Absolute: 0.1 10*3/uL (ref 0.0–0.1)
Basophils Relative: 1.2 % (ref 0.0–3.0)
Eosinophils Absolute: 0.1 10*3/uL (ref 0.0–0.7)
Eosinophils Relative: 1.8 % (ref 0.0–5.0)
HCT: 36 % (ref 36.0–46.0)
Hemoglobin: 11.4 g/dL — ABNORMAL LOW (ref 12.0–15.0)
Lymphocytes Relative: 38.8 % (ref 12.0–46.0)
Lymphs Abs: 2.5 10*3/uL (ref 0.7–4.0)
MCHC: 31.7 g/dL (ref 30.0–36.0)
MCV: 81 fl (ref 78.0–100.0)
Monocytes Absolute: 0.7 10*3/uL (ref 0.1–1.0)
Monocytes Relative: 11 % (ref 3.0–12.0)
Neutro Abs: 3 10*3/uL (ref 1.4–7.7)
Neutrophils Relative %: 47.2 % (ref 43.0–77.0)
Platelets: 355 10*3/uL (ref 150.0–400.0)
RBC: 4.44 Mil/uL (ref 3.87–5.11)
RDW: 18.5 % — ABNORMAL HIGH (ref 11.5–15.5)
WBC: 6.4 10*3/uL (ref 4.0–10.5)

## 2022-08-23 LAB — HIGH SENSITIVITY CRP: CRP, High Sensitivity: 1.99 mg/L (ref 0.000–5.000)

## 2022-08-23 LAB — SEDIMENTATION RATE: Sed Rate: 32 mm/hr — ABNORMAL HIGH (ref 0–30)

## 2022-08-31 ENCOUNTER — Encounter: Payer: Self-pay | Admitting: Family Medicine

## 2022-09-03 ENCOUNTER — Other Ambulatory Visit: Payer: Self-pay | Admitting: Family Medicine

## 2022-09-07 ENCOUNTER — Other Ambulatory Visit: Payer: Self-pay

## 2022-09-07 ENCOUNTER — Telehealth: Payer: Self-pay

## 2022-09-07 NOTE — Telephone Encounter (Signed)
Called pt about her Ozempic  Is ready for pick up. Pt stated she will come by.

## 2022-09-19 ENCOUNTER — Other Ambulatory Visit: Payer: Self-pay | Admitting: Family Medicine

## 2022-09-20 LAB — HM DEXA SCAN: HM Dexa Scan: NORMAL

## 2022-09-22 DIAGNOSIS — M1711 Unilateral primary osteoarthritis, right knee: Secondary | ICD-10-CM | POA: Diagnosis not present

## 2022-10-09 ENCOUNTER — Encounter: Payer: Self-pay | Admitting: Family Medicine

## 2022-10-10 ENCOUNTER — Other Ambulatory Visit: Payer: Self-pay | Admitting: Family Medicine

## 2022-10-10 MED ORDER — MELOXICAM 7.5 MG PO TABS: 7.5000 mg | ORAL_TABLET | Freq: Every day | ORAL | 1 refills | Status: DC | PRN

## 2022-10-17 ENCOUNTER — Ambulatory Visit: Payer: Medicare Other | Admitting: Family Medicine

## 2022-10-26 ENCOUNTER — Encounter: Payer: Self-pay | Admitting: Nurse Practitioner

## 2022-10-26 ENCOUNTER — Ambulatory Visit (INDEPENDENT_AMBULATORY_CARE_PROVIDER_SITE_OTHER): Payer: Medicare Other | Admitting: Nurse Practitioner

## 2022-10-26 ENCOUNTER — Ambulatory Visit: Payer: Medicare Other | Admitting: Internal Medicine

## 2022-10-26 VITALS — BP 116/70 | HR 98 | Temp 97.0°F | Ht 66.0 in | Wt 284.0 lb

## 2022-10-26 DIAGNOSIS — L720 Epidermal cyst: Secondary | ICD-10-CM | POA: Diagnosis not present

## 2022-10-26 DIAGNOSIS — L739 Follicular disorder, unspecified: Secondary | ICD-10-CM | POA: Diagnosis not present

## 2022-10-26 MED ORDER — DOXYCYCLINE HYCLATE 100 MG PO TABS: 100.0000 mg | ORAL_TABLET | Freq: Two times a day (BID) | ORAL | 0 refills | Status: DC

## 2022-10-26 NOTE — Patient Instructions (Signed)
It was great to see you!  Start doxycycline twice a day for 7 days. Use compresses several times a day to the back of your head.  I am placing a referral to orthopedics for the cysts on your hand and foot.   Let's follow-up if your symptoms worsen or don't improve  Take care,  Rodman Pickle, NP

## 2022-10-26 NOTE — Progress Notes (Signed)
   Acute Office Visit  Subjective:     Patient ID: [name], female    DOB: 10-26-1955, 67 y.o.   MRN: [id number]  Chief Complaint  Patient presents with   Knot back of head    Felt knot on back of head Monday afternoon, concerns with knots on hands and right foot    HPI Patient is in today for a knot on the back of her head that she felt Monday afternoon. She also noticed a knot on her foot 1 week ago. The knots on her hands have been there for a while.   She states the knots are tender. The ones in her hands hurt when she is driving or gripping things. She denies fevers and changes in her skin. Her daughter tried to pop the spot on the back of her head.   ROS See pertinent positives and negatives per HPI.    Objective:    BP 116/70 (BP Location: Left Arm)   Pulse 98   Temp (!) 97 F (36.1 C)   Ht 5\' 6"  (1.676 m)   Wt 284 lb (128.8 kg)   SpO2 98%   BMI 45.84 kg/m    Physical Exam Vitals and nursing note reviewed.  Constitutional:      General: She is not in acute distress.    Appearance: Normal appearance.  HENT:     Head: Normocephalic.  Eyes:     Conjunctiva/sclera: Conjunctivae normal.  Pulmonary:     Effort: Pulmonary effort is normal.  Musculoskeletal:     Cervical back: Normal range of motion.  Skin:    General: Skin is warm.     Comments: Small epidermal cyst to right anterior hand and right medial foot. Red, raised bump to posterior head  Neurological:     General: No focal deficit present.     Mental Status: She is alert and oriented to person, place, and time.  Psychiatric:        Mood and Affect: Mood normal.        Behavior: Behavior normal.        Thought Content: Thought content normal.        Judgment: Judgment normal.       Assessment & Plan:   Problem List Items Addressed This Visit   None Visit Diagnoses     Folliculitis    -  Primary   Red, raised fluid filled bump to posterior head. Start doxycycline 100mg  BID x7 days  and use warm compresses. F/U if not improving   Relevant Medications   doxycycline (VIBRA-TABS) 100 MG tablet   Epidermoid cyst of hand       2 cysts noted to anterior hand and 1 cyst to medial foot. Will place referral to orthopedics for further evaluation and treatment.   Relevant Orders   Ambulatory referral to Orthopedic Surgery       Meds ordered this encounter  Medications   doxycycline (VIBRA-TABS) 100 MG tablet    Sig: Take 1 tablet (100 mg total) by mouth 2 (two) times daily.    Dispense:  14 tablet    Refill:  0    Return if symptoms worsen or fail to improve.  Gerre Scull, NP

## 2022-11-08 ENCOUNTER — Ambulatory Visit
Admission: RE | Admit: 2022-11-08 | Discharge: 2022-11-08 | Disposition: A | Discharge status: Home / Self Care | Payer: Medicare Other | Source: Ambulatory Visit | Attending: Obstetrics and Gynecology | Admitting: Obstetrics and Gynecology

## 2022-11-08 DIAGNOSIS — G4733 Obstructive sleep apnea (adult) (pediatric): Secondary | ICD-10-CM | POA: Diagnosis not present

## 2022-11-08 DIAGNOSIS — Z1231 Encounter for screening mammogram for malignant neoplasm of breast: Secondary | ICD-10-CM | POA: Diagnosis not present

## 2022-11-08 DIAGNOSIS — I10 Essential (primary) hypertension: Secondary | ICD-10-CM | POA: Diagnosis not present

## 2022-11-12 NOTE — Assessment & Plan Note (Signed)
Encouraged DASH or MIND diet, decrease po intake and increase exercise as tolerated. Needs 7-8 hours of sleep nightly. Avoid trans fats, eat small, frequent meals every 4-5 hours with lean proteins, complex carbs and healthy fats. Minimize simple carbs, high fat foods and processed foods 

## 2022-11-12 NOTE — Assessment & Plan Note (Signed)
Hydrate and monitor 

## 2022-11-12 NOTE — Assessment & Plan Note (Signed)
Well controlled, no changes to meds. Encouraged heart healthy diet such as the DASH diet and exercise as tolerated.  °

## 2022-11-12 NOTE — Assessment & Plan Note (Signed)
Encourage heart healthy diet such as MIND or DASH diet, increase exercise, avoid trans fats, simple carbohydrates and processed foods, consider a krill or fish or flaxseed oil cap daily.  Patient not takig statin 

## 2022-11-12 NOTE — Assessment & Plan Note (Signed)
Supplement and monitor 

## 2022-11-12 NOTE — Assessment & Plan Note (Signed)
hgba1c acceptable, minimize simple carbs. Increase exercise as tolerated. Continue current meds 

## 2022-11-13 ENCOUNTER — Encounter: Payer: Self-pay | Admitting: Family Medicine

## 2022-11-13 ENCOUNTER — Ambulatory Visit (INDEPENDENT_AMBULATORY_CARE_PROVIDER_SITE_OTHER): Payer: Medicare Other | Admitting: Family Medicine

## 2022-11-13 VITALS — BP 124/78 | HR 93 | Temp 98.0°F | Resp 16 | Ht 66.0 in | Wt 281.6 lb

## 2022-11-13 DIAGNOSIS — M109 Gout, unspecified: Secondary | ICD-10-CM | POA: Diagnosis not present

## 2022-11-13 DIAGNOSIS — I10 Essential (primary) hypertension: Secondary | ICD-10-CM

## 2022-11-13 DIAGNOSIS — M720 Palmar fascial fibromatosis [Dupuytren]: Secondary | ICD-10-CM | POA: Diagnosis not present

## 2022-11-13 DIAGNOSIS — D649 Anemia, unspecified: Secondary | ICD-10-CM | POA: Diagnosis not present

## 2022-11-13 DIAGNOSIS — Z7984 Long term (current) use of oral hypoglycemic drugs: Secondary | ICD-10-CM

## 2022-11-13 DIAGNOSIS — R42 Dizziness and giddiness: Secondary | ICD-10-CM

## 2022-11-13 DIAGNOSIS — M79641 Pain in right hand: Secondary | ICD-10-CM | POA: Diagnosis not present

## 2022-11-13 DIAGNOSIS — E1140 Type 2 diabetes mellitus with diabetic neuropathy, unspecified: Secondary | ICD-10-CM | POA: Diagnosis not present

## 2022-11-13 DIAGNOSIS — E782 Mixed hyperlipidemia: Secondary | ICD-10-CM | POA: Diagnosis not present

## 2022-11-13 DIAGNOSIS — E559 Vitamin D deficiency, unspecified: Secondary | ICD-10-CM | POA: Diagnosis not present

## 2022-11-13 HISTORY — DX: Pain in right hand: M79.641

## 2022-11-13 LAB — COMPREHENSIVE METABOLIC PANEL
ALT: 17 U/L (ref 0–35)
AST: 21 U/L (ref 0–37)
Albumin: 4.1 g/dL (ref 3.5–5.2)
Alkaline Phosphatase: 72 U/L (ref 39–117)
BUN: 15 mg/dL (ref 6–23)
CO2: 30 mEq/L (ref 19–32)
Calcium: 10.1 mg/dL (ref 8.4–10.5)
Chloride: 98 mEq/L (ref 96–112)
Creatinine, Ser: 0.95 mg/dL (ref 0.40–1.20)
GFR: 61.65 mL/min (ref >60.00)
Glucose, Bld: 122 mg/dL — ABNORMAL HIGH (ref 70–99)
Potassium: 4.3 mEq/L (ref 3.5–5.1)
Sodium: 139 mEq/L (ref 135–145)
Total Bilirubin: 0.6 mg/dL (ref 0.2–1.2)
Total Protein: 7.6 g/dL (ref 6.0–8.3)

## 2022-11-13 LAB — LIPID PANEL
Cholesterol: 165 mg/dL (ref 0–200)
HDL: 41.3 mg/dL (ref >39.00)
LDL Cholesterol: 87 mg/dL (ref 0–99)
NonHDL: 124.17
Total CHOL/HDL Ratio: 4
Triglycerides: 184 mg/dL — ABNORMAL HIGH (ref 0.0–149.0)
VLDL: 36.8 mg/dL (ref 0.0–40.0)

## 2022-11-13 LAB — CBC WITH DIFFERENTIAL/PLATELET
Basophils Absolute: 0 10*3/uL (ref 0.0–0.1)
Basophils Relative: 0.5 % (ref 0.0–3.0)
Eosinophils Absolute: 0.1 10*3/uL (ref 0.0–0.7)
Eosinophils Relative: 1.3 % (ref 0.0–5.0)
HCT: 35.7 % — ABNORMAL LOW (ref 36.0–46.0)
Hemoglobin: 11.3 g/dL — ABNORMAL LOW (ref 12.0–15.0)
Lymphocytes Relative: 27.1 % (ref 12.0–46.0)
Lymphs Abs: 2.3 10*3/uL (ref 0.7–4.0)
MCHC: 31.6 g/dL (ref 30.0–36.0)
MCV: 80.4 fl (ref 78.0–100.0)
Monocytes Absolute: 0.7 10*3/uL (ref 0.1–1.0)
Monocytes Relative: 8.6 % (ref 3.0–12.0)
Neutro Abs: 5.2 10*3/uL (ref 1.4–7.7)
Neutrophils Relative %: 62.5 % (ref 43.0–77.0)
Platelets: 320 10*3/uL (ref 150.0–400.0)
RBC: 4.44 Mil/uL (ref 3.87–5.11)
RDW: 18.2 % — ABNORMAL HIGH (ref 11.5–15.5)
WBC: 8.3 10*3/uL (ref 4.0–10.5)

## 2022-11-13 LAB — TSH: TSH: 0.9 u[IU]/mL (ref 0.35–5.50)

## 2022-11-13 LAB — HEMOGLOBIN A1C: Hgb A1c MFr Bld: 6.7 % — ABNORMAL HIGH (ref 4.6–6.5)

## 2022-11-13 LAB — VITAMIN D 25 HYDROXY (VIT D DEFICIENCY, FRACTURES): VITD: 53.69 ng/mL (ref 30.00–100.00)

## 2022-11-13 LAB — URIC ACID: Uric Acid, Serum: 8.4 mg/dL — ABNORMAL HIGH (ref 2.4–7.0)

## 2022-11-13 NOTE — Progress Notes (Signed)
Subjective:    Patient ID: [name], female    DOB: 20-Nov-1955, 67 y.o.   MRN: [id number]  Chief Complaint  Patient presents with  . Follow-up    Follow up    HPI Discussed the use of AI scribe software for clinical note transcription with the patient, who gave verbal consent to proceed.  History of Present Illness   The patient, with a history of back surgery, reports significant improvement in back pain post-surgery. The patient mentions that the pain is now more muscle-related and is managed by staying active and stretching. The patient also reports a cyst on the hand that causes pain radiating up to the ear. The patient is scheduled to see a hand specialist at [facility] for this issue.  The patient also experiences vertigo, particularly when turning the head or driving. The patient reports stumbling when going around corners and hitting door frames due to the vertigo. The patient also has a history of gout and is currently experiencing pain in the toe, although it is not red and inflamed as it was previously. The patient has been prescribed Uloric for gout but has not been taking it regularly.  The patient also mentions a skin lesion that has been present for about a month. The patient has a dermatologist and plans to make an appointment if the lesion does not clear up in another month. The patient also reports neuropathy but does not provide further details. The patient has a history of cancer and has been taking metformin and furosemide.      Denies CP/palp/SOB/HA/congestion/fevers/GI or GU c/o. Taking meds as prescribed   Past Medical History:  Diagnosis Date  . Allergic rhinitis   . Allergy   . Anemia 07/17/2016  . Anxiety   . Asthma    seasonal  . Blood transfusion without reported diagnosis   . Breast cancer (HCC)    right  . Bronchitis   . Colon polyps   . CPAP (continuous positive airway pressure) dependence   . Depression   . Diabetes mellitus     borderline but takes metformin  . Edema   . Family history of breast cancer in first degree relative   . Fibromyalgia   . GERD (gastroesophageal reflux disease)   . Hyperglycemia 03/05/2017  . Hyperlipidemia 08/21/2016   no meds  . Hyperparathyroidism   . Hypertension    controlled by medications  . Joint pain   . Neuromuscular disorder (HCC)    Fibromyalgia  . Obesity   . Osteoarthritis    RA  . PONV (postoperative nausea and vomiting)    occasional  . Pre-diabetes   . Sleep apnea    wears cpap  . Viral meningitis   . Vitamin D deficiency     Past Surgical History:  Procedure Laterality Date  . ABDOMINAL HYSTERECTOMY  1998  . ADENOIDECTOMY    . BACK SURGERY     09/21/21  . BREAST EXCISIONAL BIOPSY Left 1993   benign  . BREAST SURGERY     Tran flap due to breast cancer  . CESAREAN SECTION  1988  . COLONOSCOPY  08/14/2011  . HAND SURGERY     dog bite, right hand  . HAND SURGERY     trauma, left hand  . KNEE ARTHROSCOPY     bilateral  . left knee replacement  2010  . LUMBAR WOUND DEBRIDEMENT N/A 10/07/2021   Procedure: LUMBAR WOUND DEBRIDEMENT/REVISION;  Surgeon: Coletta Memos, MD;  Location: [facility]  OR;  Service: Neurosurgery;  Laterality: N/A;  3C/RM 21 to follow Dr Conchita Paris  . MASTECTOMY  01/13/1994   Right breast  . parathyroid resection    . PARATHYROIDECTOMY    . POLYPECTOMY    . rectal abscess    . SEPTOPLASTY  1980  . SHOULDER ARTHROSCOPY Right 11/09/2015   Procedure: ARTHROSCOPY SHOULDER-acromioplasty, distal clavicle resection and debridement;  Surgeon: Marcene Corning, MD;  Location: [facility] OR;  Service: Orthopedics;  Laterality: Right;  . shoulder arthroscopy     rotator cuff repair  . TOE SURGERY Right    paronychia and adenoma removed  . TONSILLECTOMY    . TOTAL KNEE ARTHROPLASTY  06/18/2008   [place]  . TUMOR REMOVAL  1982   , scalp    Family History  Problem Relation Age of Onset  . Emphysema Father   . Lung cancer Father        lung ca dx 14   . Other Father        prostate issues  . Diabetes Father   . Breast cancer Maternal Aunt        dx late 45s  . Colon cancer Maternal Aunt        dx 18s  . Colon polyps Maternal Aunt   . Prostate cancer Maternal Grandfather 85       d. 85y  . Heart disease Paternal Grandfather   . Heart attack Paternal Grandfather        d. 50y  . Diabetes Paternal Grandfather   . Asthma Daughter        "seasonal"  . Arthritis Maternal Grandmother   . Aneurysm Maternal Grandmother        d. brain aneurysm at 27  . Arthritis Mother   . Colon polyps Mother   . Hypertension Mother   . Sleep apnea Mother   . Multiple sclerosis Sister   . Breast cancer Sister 79       L IDC and DCIS; ER/PR+, Her2-  . Fibrocystic breast disease Sister   . Arthritis/Rheumatoid Sister   . Breast cancer Sister   . Cancer Maternal Uncle        d. mouth cancer at younger age; smoker  . Other Paternal Uncle        muscle issues - couldn't walk or talk; d. 20y  . Cancer Maternal Aunt        lymphoma, dx 66s  . Ovarian cancer Maternal Aunt        dx 62s; d. late 72s  . Lung cancer Maternal Uncle        d. 14y; former smoker  . Throat cancer Cousin        maternal 1st cousin; smoker  . Breast cancer Cousin 58       maternal 1st cousin  . Other Paternal Uncle        prostate issues  . Breast cancer Cousin        paternal 1st cousin dx late 72s  . Throat cancer Cousin        maternal 1st cousin; used SL tobacco  . Prostate cancer Cousin        maternal 1st cousin dx 28s  . Esophageal cancer Neg Hx   . Rectal cancer Neg Hx   . Stomach cancer Neg Hx     Social History   Socioeconomic History  . Marital status: Widowed    Spouse name: Not on file  . Number of children: 1  . Years  of education: Not on file  . Highest education level: 12th grade  Occupational History  . Occupation: ACCOUNTING    Employer: [employer]  Tobacco Use  . Smoking status: Never  . Smokeless tobacco: Never  Vaping Use  .  Vaping Use: Never used  Substance and Sexual Activity  . Alcohol use: Not Currently  . Drug use: No  . Sexual activity: Not Currently  Other Topics Concern  . Not on file  Social History Narrative  . Not on file   Social Determinants of Health   Financial Resource Strain: Low Risk  (11/12/2022)   Overall Financial Resource Strain (CARDIA)   . Difficulty of Paying Living Expenses: Not very hard  Food Insecurity: No Food Insecurity (11/12/2022)   Hunger Vital Sign   . Worried About Programme researcher, broadcasting/film/video in the Last Year: Never true   . Ran Out of Food in the Last Year: Never true  Transportation Needs: No Transportation Needs (11/12/2022)   PRAPARE - Transportation   . Lack of Transportation (Medical): No   . Lack of Transportation (Non-Medical): No  Physical Activity: Insufficiently Active (11/12/2022)   Exercise Vital Sign   . Days of Exercise per Week: 2 days   . Minutes of Exercise per Session: 20 min  Stress: No Stress Concern Present (11/12/2022)   [employer] of Occupational Health - Occupational Stress Questionnaire   . Feeling of Stress : Only a little  Social Connections: Moderately Integrated (11/12/2022)   Social Connection and Isolation Panel [NHANES]   . Frequency of Communication with Friends and Family: More than three times a week   . Frequency of Social Gatherings with Friends and Family: Twice a week   . Attends Religious Services: More than 4 times per year   . Active Member of Clubs or Organizations: Yes   . Attends Banker Meetings: More than 4 times per year   . Marital Status: Widowed  Intimate Partner Violence: Not At Risk (02/21/2022)   Humiliation, Afraid, Rape, and Kick questionnaire   . Fear of Current or Ex-Partner: No   . Emotionally Abused: No   . Physically Abused: No   . Sexually Abused: No    Outpatient Medications Prior to Visit  Medication Sig Dispense Refill  . acetaminophen (TYLENOL) 650 MG CR tablet Take 1,300 mg by mouth  every 8 (eight) hours as needed for pain.    Marland Kitchen albuterol (VENTOLIN HFA) 108 (90 Base) MCG/ACT inhaler Inhale 2 puffs into the lungs every 6 (six) hours as needed for wheezing or shortness of breath. 8 g 0  . calcium carbonate (OSCAL) 1500 (600 Ca) MG TABS tablet Take 1,500 mg by mouth 2 (two) times daily with a meal.    . cholecalciferol (VITAMIN D3) 25 MCG (1000 UNIT) tablet Take 1,000 Units by mouth daily.    . colchicine 0.6 MG tablet Take 1 tablet (0.6 mg total) by mouth 2 (two) times daily as needed. 60 tablet 2  . Cyanocobalamin (B-12) 1000 MCG CAPS Take 1 capsule by mouth daily at 6 (six) AM.    . famotidine (PEPCID) 40 MG tablet Take 1 tablet (40 mg total) by mouth daily. 30 tablet 5  . furosemide (LASIX) 40 MG tablet TAKE 1 TABLET BY MOUTH  TWICE DAILY (Patient taking differently: Take 40 mg by mouth daily.) 180 tablet 3  . hydrochlorothiazide (MICROZIDE) 12.5 MG capsule TAKE 1 CAPSULE BY MOUTH DAILY (Patient taking differently: Take 12.5 mg by mouth daily.) 90  capsule 3  . Iron, Ferrous Sulfate, 325 (65 Fe) MG TABS Take 325 mg by mouth every other day. 30 tablet   . meloxicam (MOBIC) 7.5 MG tablet Take 1-2 tablets (7.5-15 mg total) by mouth daily as needed for pain. 60 tablet 1  . metFORMIN (GLUCOPHAGE) 500 MG tablet TAKE 1 TABLET BY MOUTH TWICE  DAILY WITH A MEAL 200 tablet 2  . Multiple Vitamins-Minerals (MULTIVITAMIN & MINERAL PO) Take 1 tablet by mouth daily.    . promethazine-dextromethorphan (PROMETHAZINE-DM) 6.25-15 MG/5ML syrup Take 5 mLs by mouth 4 (four) times daily as needed for cough. 118 mL 0  . telmisartan (MICARDIS) 20 MG tablet TAKE 1 TABLET BY MOUTH DAILY (Patient taking differently: Take 20 mg by mouth daily.) 90 tablet 3  . doxycycline (VIBRA-TABS) 100 MG tablet Take 1 tablet (100 mg total) by mouth 2 (two) times daily. 14 tablet 0   No facility-administered medications prior to visit.    Allergies  Allergen Reactions  . Allopurinol Hives  . Mucinex [Guaifenesin  Er] Shortness Of Breath  . Lyrica [Pregabalin] Swelling  . Ozempic (0.25 Or 0.5 Mg-Dose) [Semaglutide(0.25 Or 0.5mg -Dos)] Other (See Comments)    Abdominal pain  . Penicillins Hives and Other (See Comments)    Has patient had a PCN reaction causing immediate rash, facial/tongue/throat swelling, SOB or lightheadedness with hypotension: no Has patient had a PCN reaction causing severe rash involving mucus membranes or skin necrosis: no Has patient had a PCN reaction that required hospitalization no Has patient had a PCN reaction occurring within the last 10 years: no If all of the above answers are "NO", then may proceed with Cephalosporin use.   . Prozac [Fluoxetine Hcl] Hives  . Amitriptyline Other (See Comments)    "felt weird, fatigue, dizziness"  . Lexapro [Escitalopram Oxalate]     Nausea and hypersalivation.     Review of Systems  Constitutional:  Positive for malaise/fatigue. Negative for fever.  HENT:  Negative for congestion.   Eyes:  Negative for blurred vision.  Respiratory:  Negative for shortness of breath.   Cardiovascular:  Negative for chest pain, palpitations and leg swelling.  Gastrointestinal:  Negative for abdominal pain, blood in stool and nausea.  Genitourinary:  Negative for dysuria and frequency.  Musculoskeletal:  Positive for back pain and joint pain. Negative for falls.  Skin:  Negative for rash.  Neurological:  Negative for dizziness, loss of consciousness and headaches.  Endo/Heme/Allergies:  Negative for environmental allergies.  Psychiatric/Behavioral:  Negative for depression. The patient is not nervous/anxious.        Objective:    Physical Exam Constitutional:      General: She is not in acute distress.    Appearance: Normal appearance. She is well-developed. She is not toxic-appearing.  HENT:     Head: Normocephalic and atraumatic.     Right Ear: External ear normal.     Left Ear: External ear normal.     Nose: Nose normal.  Eyes:      General:        Right eye: No discharge.        Left eye: No discharge.     Conjunctiva/sclera: Conjunctivae normal.  Neck:     Thyroid: No thyromegaly.  Cardiovascular:     Rate and Rhythm: Normal rate and regular rhythm.     Heart sounds: Normal heart sounds. No murmur heard. Pulmonary:     Effort: Pulmonary effort is normal. No respiratory distress.     Breath sounds:  Normal breath sounds.  Abdominal:     General: Bowel sounds are normal.     Palpations: Abdomen is soft.     Tenderness: There is no abdominal tenderness. There is no guarding.  Musculoskeletal:        General: Normal range of motion.     Cervical back: Neck supple.  Lymphadenopathy:     Cervical: No cervical adenopathy.  Skin:    General: Skin is warm and dry.     Comments: Small brown lesion on arch of foot, right and swelling over great toe in MTP joint  Neurological:     Mental Status: She is alert and oriented to person, place, and time.  Psychiatric:        Mood and Affect: Mood normal.        Behavior: Behavior normal.        Thought Content: Thought content normal.        Judgment: Judgment normal.    BP 124/78 (BP Location: Left Arm, Patient Position: Sitting, Cuff Size: Normal)   Pulse 93   Temp 98 F (36.7 C) (Oral)   Resp 16   Ht 5\' 6"  (1.676 m)   Wt 281 lb 9.6 oz (127.7 kg)   SpO2 95%   BMI 45.45 kg/m  Wt Readings from Last 3 Encounters:  11/13/22 281 lb 9.6 oz (127.7 kg)  10/26/22 284 lb (128.8 kg)  08/17/22 282 lb 3.2 oz (128 kg)    Diabetic Foot Exam - Simple   No data filed    Lab Results  Component Value Date   WBC 6.4 08/23/2022   HGB 11.4 (L) 08/23/2022   HCT 36.0 08/23/2022   PLT 355.0 08/23/2022   GLUCOSE 128 (H) 08/17/2022   CHOL 138 07/31/2022   TRIG 176.0 (H) 07/31/2022   HDL 44.90 07/31/2022   LDLDIRECT 110.0 08/21/2016   LDLCALC 58 07/31/2022   ALT 16 08/17/2022   AST 19 08/17/2022   NA 139 08/17/2022   K 3.9 08/17/2022   CL 98 08/17/2022   CREATININE  0.86 08/17/2022   BUN 14 08/17/2022   CO2 30 08/17/2022   TSH 0.91 07/31/2022   INR 2.0 (H) 06/21/2008   HGBA1C 6.5 07/31/2022    Lab Results  Component Value Date   TSH 0.91 07/31/2022   Lab Results  Component Value Date   WBC 6.4 08/23/2022   HGB 11.4 (L) 08/23/2022   HCT 36.0 08/23/2022   MCV 81.0 08/23/2022   PLT 355.0 08/23/2022   Lab Results  Component Value Date   NA 139 08/17/2022   K 3.9 08/17/2022   CO2 30 08/17/2022   GLUCOSE 128 (H) 08/17/2022   BUN 14 08/17/2022   CREATININE 0.86 08/17/2022   BILITOT 0.5 08/17/2022   ALKPHOS 75 08/17/2022   AST 19 08/17/2022   ALT 16 08/17/2022   PROT 7.3 08/17/2022   ALBUMIN 4.2 08/17/2022   CALCIUM 10.0 08/17/2022   ANIONGAP 12 07/13/2022   GFR 69.59 08/17/2022   Lab Results  Component Value Date   CHOL 138 07/31/2022   Lab Results  Component Value Date   HDL 44.90 07/31/2022   Lab Results  Component Value Date   LDLCALC 58 07/31/2022   Lab Results  Component Value Date   TRIG 176.0 (H) 07/31/2022   Lab Results  Component Value Date   CHOLHDL 3 07/31/2022   Lab Results  Component Value Date   HGBA1C 6.5 07/31/2022       Assessment &  Plan:  Essential hypertension Assessment & Plan: Well controlled, no changes to meds. Encouraged heart healthy diet such as the DASH diet and exercise as tolerated.    Orders: -     CBC with Differential/Platelet -     Comprehensive metabolic panel -     TSH  Vitamin D deficiency Assessment & Plan: Supplement and monitor   Orders: -     VITAMIN D 25 Hydroxy (Vit-D Deficiency, Fractures)  Morbid obesity (HCC) Assessment & Plan: Encouraged DASH or MIND diet, decrease po intake and increase exercise as tolerated. Needs 7-8 hours of sleep nightly. Avoid trans fats, eat small, frequent meals every 4-5 hours with lean proteins, complex carbs and healthy fats. Minimize simple carbs, high fat foods and processed foods    Type 2 diabetes mellitus with diabetic  neuropathy, without long-term current use of insulin (HCC) Assessment & Plan: hgba1c acceptable, minimize simple carbs. Increase exercise as tolerated. Continue current meds   Orders: -     Hemoglobin A1c  Gout of big toe Assessment & Plan: Hydrate and monitor, has not been taking Uloric will check Uric acid today  Orders: -     Uric acid  Mixed hyperlipidemia Assessment & Plan: Encourage heart healthy diet such as MIND or DASH diet, increase exercise, avoid trans fats, simple carbohydrates and processed foods, consider a krill or fish or flaxseed oil cap daily.  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 Has nystagmus bilaterally worse to left. Referred to PT  Orders: -     Ambulatory referral to Physical Therapy  Anemia, unspecified type -     Iron, TIBC and Ferritin Panel    Assessment and Plan    Vertigo: Persistent but intermittent vertigo with nystagmus. Patient reports stumbling, particularly when turning corners. -Refer to physical therapy for vertigo management.  Gout: Patient reports pain in toe, previously intolerant to allopurinol. Currently on Uloric. -Check uric acid levels. -Continue Uloric daily.  Hand Pain: Patient reports pain in hand, likely due to scar tissue in tendon sheath. -Consider referral to hand specialist if pain persists or worsens.  Skin Lesion: Persistent skin lesion on foot for at least a month. -If lesion persists for another month, refer to dermatologist.  Neuropathy: Chronic condition, patient reports no significant change. -Continue current management strategies to prevent progression.  General Health Maintenance: -Order full panel of blood work including iron, vitamin D, sugar, cholesterol, and uric acid. -Schedule follow-up visit in  3-4 months for physical examination. -Continue current vitamin and supplement regimen.         Danise Edge, MD

## 2022-11-13 NOTE — Assessment & Plan Note (Signed)
Persistent but intermittent. Has nystagmus bilaterally worse to left. Referred to PT

## 2022-11-13 NOTE — Patient Instructions (Addendum)
Tylenol/Acetaminophen ES 500 mg tabs, 1-2 tabs by mouth up to three times daily, max of 3000 mg in 24 hours.Gout  Gout is a condition that causes painful swelling of the joints. Gout is a type of inflammation of the joints (arthritis). This condition is caused by having too much uric acid in the body. Uric acid is a chemical that forms when the body breaks down substances called purines. Purines are important for building body proteins. When the body has too much uric acid, sharp crystals can form and build up inside the joints. This causes pain and swelling. Gout attacks can happen quickly and may be very painful (acute gout). Over time, the attacks can affect more joints and become more frequent (chronic gout). Gout can also cause uric acid to build up under the skin and inside the kidneys. What are the causes? This condition is caused by too much uric acid in your blood. This can happen because: Your kidneys do not remove enough uric acid from your blood. This is the most common cause. Your body makes too much uric acid. This can happen with some cancers and cancer treatments. It can also occur if your body is breaking down too many red blood cells (hemolytic anemia). You eat too many foods that are high in purines. These foods include organ meats and some seafood. Alcohol, especially beer, is also high in purines. A gout attack may be triggered by trauma or stress. What increases the risk? The following factors may make you more likely to develop this condition: Having a family history of gout. Being female and middle-aged. Being female and having gone through menopause. Taking certain medicines, including aspirin, cyclosporine, diuretics, levodopa, and niacin. Having an organ transplant. Having certain conditions, such as: Being obese. Lead poisoning. Kidney disease. A skin condition called psoriasis. Other factors include: Losing weight too quickly. Being dehydrated. Frequently drinking  alcohol, especially beer. Frequently drinking beverages that are sweetened with a type of sugar called fructose. What are the signs or symptoms? An attack of acute gout happens quickly. It usually occurs in just one joint. The most common place is the big toe. Attacks often start at night. Other joints that may be affected include joints of the feet, ankle, knee, fingers, wrist, or elbow. Symptoms of this condition may include: Severe pain. Warmth. Swelling. Stiffness. Tenderness. The affected joint may be very painful to touch. Shiny, red, or purple skin. Chills and fever. Chronic gout may cause symptoms more frequently. More joints may be involved. You may also have white or yellow lumps (tophi) on your hands or feet or in other areas near your joints. How is this diagnosed? This condition is diagnosed based on your symptoms, your medical history, and a physical exam. You may have tests, such as: Blood tests to measure uric acid levels. Removal of joint fluid with a thin needle (aspiration) to look for uric acid crystals. X-rays to look for joint damage. How is this treated? Treatment for this condition has two phases: treating an acute attack and preventing future attacks. Acute gout treatment may include medicines to reduce pain and swelling, including: NSAIDs, such as ibuprofen. Steroids. These are strong anti-inflammatory medicines that can be taken by mouth (orally) or injected into a joint. Colchicine. This medicine relieves pain and swelling when it is taken soon after an attack. It can be given by mouth or through an IV. Preventive treatment may include: Daily use of smaller doses of NSAIDs or colchicine. Use of a medicine  that reduces uric acid levels in your blood, such as allopurinol. Changes to your diet. You may need to see a dietitian about what to eat and drink to prevent gout. Follow these instructions at home: During a gout attack  If directed, put ice on the affected  area. To do this: Put ice in a plastic bag. Place a towel between your skin and the bag. Leave the ice on for 20 minutes, 2-3 times a day. Remove the ice if your skin turns bright red. This is very important. If you cannot feel pain, heat, or cold, you have a greater risk of damage to the area. Raise (elevate) the affected joint above the level of your heart as often as possible. Rest the joint as much as possible. If the affected joint is in your leg, you may be given crutches to use. Follow instructions from your health care provider about eating or drinking restrictions. Avoiding future gout attacks Follow a low-purine diet as told by your dietitian or health care provider. Avoid foods and drinks that are high in purines, including liver, kidney, anchovies, asparagus, herring, mushrooms, mussels, and beer. Maintain a healthy weight or lose weight if you are overweight. If you want to lose weight, talk with your health care provider. Do not lose weight too quickly. Start or maintain an exercise program as told by your health care provider. Eating and drinking Avoid drinking beverages that contain fructose. Drink enough fluids to keep your urine pale yellow. If you drink alcohol: Limit how much you have to: 0-1 drink a day for women who are not pregnant. 0-2 drinks a day for men. Know how much alcohol is in a drink. In the [place] one drink equals one 12 oz bottle of beer (355 mL), one 5 oz glass of wine (148 mL), or one 1 oz glass of hard liquor (44 mL). General instructions Take over-the-counter and prescription medicines only as told by your health care provider. Ask your health care provider if the medicine prescribed to you requires you to avoid driving or using machinery. Return to your normal activities as told by your health care provider. Ask your health care provider what activities are safe for you. Keep all follow-up visits. This is important. Where to find more  information [employer]: www.niams.http://www.myers.net/ Contact a health care provider if you have: Another gout attack. Continuing symptoms of a gout attack after 10 days of treatment. Side effects from your medicines. Chills or a fever. Burning pain when you urinate. Pain in your lower back or abdomen. Get help right away if you: Have severe or uncontrolled pain. Cannot urinate. Summary Gout is painful swelling of the joints caused by having too much uric acid in the body. The most common site for gout to occur is in the big toe, but it can affect other joints in the body. Medicines and dietary changes can help to prevent and treat gout attacks. This information is not intended to replace advice given to you by your health care provider. Make sure you discuss any questions you have with your health care provider. Document Revised: 02/16/2021 Document Reviewed: 02/16/2021 Elsevier Patient Education  2024 [employer].

## 2022-11-13 NOTE — Assessment & Plan Note (Signed)
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

## 2022-11-14 ENCOUNTER — Telehealth: Payer: Self-pay

## 2022-11-14 LAB — IRON,TIBC AND FERRITIN PANEL
%SAT: 14 % (calc) — ABNORMAL LOW (ref 16–45)
Ferritin: 29 ng/mL (ref 16–288)
Iron: 55 ug/dL (ref 45–160)
TIBC: 382 mcg/dL (calc) (ref 250–450)

## 2022-11-14 NOTE — Telephone Encounter (Signed)
Called pt regarding [employer] orders  For C-pap supplies. Pt stated she don't anything and another provider takes care of that.

## 2022-11-20 ENCOUNTER — Telehealth: Payer: Self-pay

## 2022-11-20 NOTE — Telephone Encounter (Signed)
Called pt was advised I was mailing I-fob kit to her home  for her to complete and pt stated she would get it done.

## 2022-12-13 DIAGNOSIS — M25561 Pain in right knee: Secondary | ICD-10-CM | POA: Diagnosis not present

## 2022-12-20 ENCOUNTER — Telehealth: Payer: Self-pay | Admitting: Licensed Clinical Social Worker

## 2022-12-20 ENCOUNTER — Ambulatory Visit: Payer: Self-pay | Admitting: Licensed Clinical Social Worker

## 2022-12-20 NOTE — Patient Outreach (Signed)
  Care Coordination  Initial Visit Note   12/20/2022 Name: [name] MRN: [id number] DOB: 1956-02-27  [name] is a 67 y.o. year old female who sees Bradd Canary, MD for primary care. I spoke with  Esmae Rochele Pages by phone today.  What matters to the patients health and wellness today?  Understanding how to best care for her mother without getting frustrated.     Goals Addressed             This Visit's Progress    COMPLETED: Review Caregiver resouces       Activities and task to complete in order to accomplish goals.   Review  booklets mailed  "Caring for a Person With Alzheimer's Disease and "The Caregiver's Handbook"  Provide copy of Health Care POA for your medical chart        SDOH assessments and interventions completed:  Yes  SDOH Interventions Today    Flowsheet Row Most Recent Value  SDOH Interventions   Alcohol Usage Interventions Intervention Not Indicated (Score <7)  Health Literacy Interventions Intervention Not Indicated       Care Coordination Interventions:  Yes, provided  Interventions Today    Flowsheet Row Most Recent Value  Chronic Disease   Chronic disease during today's visit Hypertension (HTN), Diabetes  General Interventions   General Interventions Discussed/Reviewed General Interventions Discussed  [reviewed care coordination services]  Education Interventions   Education Provided Provided Education  Provided Verbal Education On Biomedical engineer resources]  Mental Health Interventions   Mental Health Discussed/Reviewed Mental Health Reviewed  [active listening for caregiver support]  Advanced Directive Interventions   Advanced Directives Discussed/Reviewed Advanced Directives Discussed  [Has document will provide a copy to provider's office]       Follow up plan: No further intervention required.   Encounter Outcome:  Pt. Visit Completed   Sammuel Hines, LCSW Social Work Care Coordination   [facility] [employer] [phone]

## 2022-12-20 NOTE — Patient Instructions (Signed)
Social Work Visit Information  Thank you for taking time to visit with me today. Please don't hesitate to contact me if I can be of assistance to you.   Following are the goals we discussed today:   Goals Addressed             This Visit's Progress    COMPLETED: Review Caregiver resouces       Activities and task to complete in order to accomplish goals.   Review  booklets mailed  "Caring for a Person With Alzheimer's Disease and "The Caregiver's Handbook"  Provide copy of Health Care POA for your medical chart         Patient does not desire continued follow-up by social work. They will contact the office if needed  Please call the care guide team at [phone] if you need to cancel or reschedule your appointment.   If you or anyone you know are experiencing a Mental Health or Behavioral Health Crisis or need someone to talk to, please call the Suicide and Crisis Lifeline: 988 call the [place] National Suicide Prevention Lifeline: 401-409-1897 or TTY: 936-295-4997 TTY 478-592-2615) to talk to a trained counselor call 1-800-273-TALK (toll free, 24 hour hotline) go to [facility] [address], [place] [phone])   Patient verbalizes understanding of instructions and care plan provided today and agrees to view in MyChart. Active MyChart status and patient understanding of how to access instructions and care plan via MyChart confirmed with patient.       Sammuel Hines, LCSW Social Work Care Coordination  [facility] [employer] [phone]

## 2022-12-20 NOTE — Patient Outreach (Signed)
  Care Coordination   12/20/2022 Name: [name] MRN: [id number] DOB: [birth year]   Care Coordination Outreach Attempts:  An unsuccessful telephone outreach was attempted today to offer the patient information about available care coordination services.  Follow Up Plan:  Additional outreach attempts will be made to offer the patient care coordination information and services.   Encounter Outcome:  No Answer   Care Coordination Interventions:  No, not indicated    Sammuel Hines, LCSW Social Work Care Coordination  [facility] [employer] [phone]

## 2023-01-03 ENCOUNTER — Ambulatory Visit: Payer: Medicare Other | Admitting: Physical Therapy

## 2023-01-05 ENCOUNTER — Ambulatory Visit: Payer: Medicare Other | Admitting: Physical Therapy

## 2023-01-09 ENCOUNTER — Encounter: Payer: Medicare Other | Admitting: Physical Therapy

## 2023-01-11 ENCOUNTER — Encounter: Payer: Medicare Other | Admitting: Physical Therapy

## 2023-01-15 ENCOUNTER — Other Ambulatory Visit: Payer: Self-pay | Admitting: Family Medicine

## 2023-01-22 NOTE — Therapy (Addendum)
OUTPATIENT PHYSICAL THERAPY VESTIBULAR EVALUATION/Discharge Summary     Patient Name: [name] MRN: [id number] DOB:1955/06/10, 67 y.o., female Today's Date: 01/24/2023  END OF SESSION:  PT End of Session - 01/24/23 0804     Visit Number 1    Date for PT Re-Evaluation 03/21/23    Authorization Type [employer]    PT Start Time 0805    PT Stop Time 0848    PT Time Calculation (min) 43 min    Activity Tolerance Patient tolerated treatment well    Behavior During Therapy [facility] for tasks assessed/performed             Past Medical History:  Diagnosis Date   Allergic rhinitis    Allergy    Anemia 07/17/2016   Anxiety    Asthma    seasonal   Blood transfusion without reported diagnosis    Breast cancer (HCC)    right   Bronchitis    Colon polyps    CPAP (continuous positive airway pressure) dependence    Depression    Diabetes mellitus    borderline but takes metformin   Edema    Family history of breast cancer in first degree relative    Fibromyalgia    GERD (gastroesophageal reflux disease)    Hyperglycemia 03/05/2017   Hyperlipidemia 08/21/2016   no meds   Hyperparathyroidism    Hypertension    controlled by medications   Joint pain    Neuromuscular disorder (HCC)    Fibromyalgia   Obesity    Osteoarthritis    RA   PONV (postoperative nausea and vomiting)    occasional   Pre-diabetes    Sleep apnea    wears cpap   Viral meningitis    Vitamin D deficiency    Past Surgical History:  Procedure Laterality Date   ABDOMINAL HYSTERECTOMY  1998   ADENOIDECTOMY     BACK SURGERY     09/21/21   BREAST EXCISIONAL BIOPSY Left 1993   benign   BREAST SURGERY     Tran flap due to breast cancer   CESAREAN SECTION  1988   COLONOSCOPY  08/14/2011   HAND SURGERY     dog bite, right hand   HAND SURGERY     trauma, left hand   KNEE ARTHROSCOPY     bilateral   left knee replacement  2010   LUMBAR WOUND DEBRIDEMENT N/A 10/07/2021   Procedure: LUMBAR WOUND  DEBRIDEMENT/REVISION;  Surgeon: Coletta Memos, MD;  Location: [facility] OR;  Service: Neurosurgery;  Laterality: N/A;  3C/RM 21 to follow Dr Conchita Paris   MASTECTOMY  01/13/1994   Right breast   parathyroid resection     PARATHYROIDECTOMY     POLYPECTOMY     rectal abscess     SEPTOPLASTY  1980   SHOULDER ARTHROSCOPY Right 11/09/2015   Procedure: ARTHROSCOPY SHOULDER-acromioplasty, distal clavicle resection and debridement;  Surgeon: Marcene Corning, MD;  Location: [facility] OR;  Service: Orthopedics;  Laterality: Right;   shoulder arthroscopy     rotator cuff repair   TOE SURGERY Right    paronychia and adenoma removed   TONSILLECTOMY     TOTAL KNEE ARTHROPLASTY  06/18/2008   Daldorf   TUMOR REMOVAL  1982   , scalp   Patient Active Problem List   Diagnosis Date Noted   Right hand pain 11/13/2022   Elevated sed rate 04/03/2022   Low back pain 01/11/2022   Drainage from wound 10/07/2021   Spondylolisthesis of lumbar region 09/21/2021  DDD (degenerative disc disease), cervical 05/04/2021   Neck pain 05/02/2021   Left arm pain 01/21/2021   Memory changes 04/19/2020   Educated about COVID-19 virus infection 04/19/2020   Peripheral neuropathy 04/19/2020   TMJ arthralgia 04/19/2020   Urinary incontinence 10/27/2019   Other peripheral vertigo, unspecified ear 10/27/2019   Vertigo 03/14/2018   RLS (restless legs syndrome) 06/07/2017   Right knee pain 12/06/2016   Arthralgia 12/03/2016   Hyperlipidemia 08/21/2016   Anemia 07/17/2016   Genetic testing 05/15/2016   Allergic urticaria 03/27/2016   Dermographia 03/27/2016   History of breast cancer 03/27/2016   Rheumatoid arthritis (HCC) 06/24/2015   Diverticulitis of colon without hemorrhage 09/21/2014   Asthma with acute exacerbation 07/14/2014   Gout of big toe 03/10/2013   Preventative health care 10/08/2012   OSA (obstructive sleep apnea) 04/23/2012   Diabetes mellitus, type 2 (HCC) 12/10/2011   Diarrhea 09/04/2011   Personal history of  colonic polyps - adenoma 08/14/2011   Fatigue 08/03/2011   Ear canal dryness 08/03/2011   Palpitations 09/21/2010   Morbid obesity (HCC) 09/21/2010   PAC (premature atrial contraction) 09/21/2010   ATTENTION DEFICIT DISORDER, INATTENTIVE TYPE 09/22/2009   VIRAL MENINGITIS, HX OF 04/29/2009   Insomnia 02/15/2009   Vitamin D deficiency 03/11/2008   Fibromyalgia 03/05/2008   HYPERPARATHYROIDISM, HX OF 08/06/2007   Allergic rhinitis 07/05/2007   Osteoarthritis 02/28/2007   Edema 11/01/2006   Depression with anxiety 06/25/2006   Essential hypertension 06/25/2006   Gastroesophageal reflux disease without esophagitis 06/25/2006    PCP: Bradd Canary, MD   REFERRING PROVIDER: Bradd Canary, MD  REFERRING DIAG: R42 (ICD-10-CM) - Vertigo   THERAPY DIAG:  Dizziness and giddiness  Unsteadiness on feet  Muscle weakness (generalized)  ONSET DATE: about 1 year ago  Rationale for Evaluation and Treatment: Rehabilitation  SUBJECTIVE:   SUBJECTIVE STATEMENT: Had the vertigo and thought it went away, but her PCP did the test and said still had it.  Started about a year ago, can't stand in shower and close eyes, feels like she is going to fall.  Closing eyes is worse part.   Pt accompanied by: self  PERTINENT HISTORY: T2DM, HTN, GERD, R knee OA, fibromyalgia,   PAIN:  Are you having pain? No  PRECAUTIONS: None  RED FLAGS: None   WEIGHT BEARING RESTRICTIONS: No  FALLS: Has patient fallen in last 6 months? No  LIVING ENVIRONMENT: Lives with: lives with their spouse Lives in: House/apartment Stairs: No Has following equipment at home: None  PLOF: Vocation/Vocational requirements: hotel, part time back office, accounting and Leisure: enjoys walking, limited by R knee pain/OA  PATIENT GOALS: stop dizziness  OBJECTIVE:   DIAGNOSTIC FINDINGS: none relevant  COGNITION: Overall cognitive status: Within functional limits for tasks assessed   SENSATION: WFL has  protective sensation bottom both feet, also has neuropathy/burning in feet.  POSTURE:  No Significant postural limitations  Cervical ROM:    Active A/PROM (deg) eval  Flexion 50  Extension 50  Right lateral flexion   Left lateral flexion   Right rotation 60  Left rotation 60  (Blank rows = not tested)  STRENGTH: 5/5 bil UE strength, decreased grip strength R hand however has Dupetryn contracture.  Left hand dominant.   LOWER EXTREMITY MMT:  4/5 based on function, RLE pain today so deferred further MMT testing.   MMT Right eval Left eval  Hip flexion 4p! 5  Hip abduction    Hip adduction    Hip  internal rotation    Hip external rotation    Knee flexion    Knee extension    Ankle dorsiflexion    Ankle plantarflexion    Ankle inversion    Ankle eversion    (Blank rows = not tested)   GAIT: Gait pattern: step to pattern and antalgic Distance walked: 50' Assistive device utilized: None Level of assistance: Complete Independence Comments: antalgic due to R knee pain, visually slow  PATIENT SURVEYS:  DHI 40%  VESTIBULAR ASSESSMENT:  GENERAL OBSERVATION: Pt. Arrives with antalgic gait, reports concerns over R knee and lower leg swelling (initially denied pain during subjective) and concerns about blood clot).  Noted warmth and swelling, pain with calf squeeze so recommended evaluation.  Otherwise in no apparent distress.    SYMPTOM BEHAVIOR:  Subjective history: Dizziness acutely occurred about 2 years ago, but has been ongoing for about 1 year.  Occur with head movements and closing eyes.  Especially notices when closing eyes and washing hair in showers.  Note really having dizzy spells per se.  She feels more off balance, so takes extra steps when walking or trouble turning corners.   Non-Vestibular symptoms:  none  Type of dizziness: Imbalance (Disequilibrium)  Frequency: daily   Duration: no specific period  Aggravating factors: Induced by position change:  looking down and closing eyes  Relieving factors:  open eyes, touch something  Progression of symptoms: unchanged  OCULOMOTOR EXAM:  Ocular Alignment: normal  Ocular ROM: No Limitations  Spontaneous Nystagmus: absent  Gaze-Induced Nystagmus: age appropriate nystagmus at end range  Smooth Pursuits: saccades  Saccades: extra eye movements  Convergence: 17 cm gets blurry    VESTIBULAR - OCULAR REFLEX:   Slow VOR: Normal  VOR Cancellation: Corrective Saccades  Head-Impulse Test: HIT Right: negative HIT Left: negative  Dynamic Visual Acuity:  NT   POSITIONAL TESTING: Right Dix-Hallpike: no nystagmus Left Dix-Hallpike: no nystagmus Right Roll Test: no nystagmus Left Roll Test: no nystagmus  MOTION SENSITIVITY:  Motion Sensitivity Quotient Intensity: 0 = none, 1 = Lightheaded, 2 = Mild, 3 = Moderate, 4 = Severe, 5 = Vomiting  Intensity  1. Sitting to supine   2. Supine to L side   3. Supine to R side   4. Supine to sitting   5. L Hallpike-Dix 0  6. Up from L  0  7. R Hallpike-Dix 0  8. Up from R  0  9. Sitting, head tipped to L knee   10. Head up from L knee   11. Sitting, head tipped to R knee   12. Head up from R knee   13. Sitting head turns x5 2  14.Sitting head nods x5 2  15. In stance, 180 turn to L    16. In stance, 180 turn to R     OTHOSTATICS: not done   VESTIBULAR TREATMENT:                                                                                                   DATE:   01/24/23 Self Care: Education on VOR, balance  systems, initial HEP  Gaze Adaptation:  x1 Viewing Horizontal: Position: seated and x1 Viewing Vertical:  Reps: 3 x 10  x1 Viewing Vertical: Position: seated and x1 Viewing Vertical:  Reps: 3 x 10    PATIENT EDUCATION: Education details: findings, POC, initial HEP Person educated: Patient Education method: Explanation, Demonstration, and Handouts Education comprehension: verbalized understanding  HOME EXERCISE PROGRAM: Access  Code: [email or web address] URL: [email or web address] Date: 01/24/2023 Prepared by: Harrie Foreman  Exercises - Seated Gaze Stabilization with Head Rotation  - 3 x daily - 7 x weekly - 3 sets - 10 reps - Seated Gaze Stabilization with Head Nod  - 3 x daily - 7 x weekly - 3 sets - 10 reps  GOALS: Goals reviewed with patient? Yes  SHORT TERM GOALS: Target date: 02/07/2023   Patient will report compliance with initial HEP.  Baseline: Goal status: INITIAL  2.  Patient will complete further balance testing.  Baseline:  Goal status: INITIAL  LONG TERM GOALS: Target date: 03/21/2023    Patient will be independent with progressed HEP to improve outcomes and carryover.  Baseline:  Goal status: INITIAL  2.  Patient will report 75% improvement in dizziness/imbalance. Baseline:  Goal status: INITIAL  3.  Patient will demonstrate > 19/30 on FGA to decrease risk of falls.  Baseline: TBD Goal status: INITIAL  4.  Patient will report < 23% on DHI to demonstrate improved QOL. Baseline: 40% Goal status: INITIAL  5.  Patient will report no dizziness/imbalance when closing eyes in shower Baseline: feels like she is going to fall in shower.  Goal status: INITIAL  6.  Patient will demonstrate improved functional strength with 5x STS <14 seconds Baseline: NT Goal status: INITIAL  ASSESSMENT:  CLINICAL IMPRESSION: [name]  is a 67 y.o. female who was seen today for physical therapy evaluation and treatment for vertigo.  She reports more a general feeling of imbalance, worsened when she closes her eyes.  She was negative for BPPV (horizontal and posterior canals tested).  She does have end range nystagmus (L >R), and increased saccades with smooth pursuits and VOR cancellation.  She also reports mild dizziness with repeated head turns.  She has T2DM and neuropathy, but protective sensation in feet is still intact.  Due to time was unable to test balance today, but discussed  her symptoms were most likely a combination of decreased proprioception from the diabetic neuropathy and impaired VOR, and given VOR x 1 exercises (seated) today for HEP.  Will test balance further next session.  [name] would benefit from skilled physical therapy to improve balance, decrease dizziness/imbalance sensation, and decrease risk of falls.    She also reported R knee pain, swelling, and throbbing, 6/10 pain today, and was worried about blood clot.  She reported pain with calf squeeze and area was warm, advised to seek medical attention, she is going next door after appt. To get appt today.   OBJECTIVE IMPAIRMENTS: Abnormal gait, decreased activity tolerance, decreased balance, difficulty walking, decreased strength, decreased safety awareness, dizziness, and pain.   ACTIVITY LIMITATIONS: standing, stairs, bathing, hygiene/grooming, and locomotion level  PARTICIPATION LIMITATIONS: community activity  PERSONAL FACTORS: Time since onset of injury/illness/exacerbation and 1-2 comorbidities: T2DM, HTN, GERD, R knee OA, fibromyalgia,   are also affecting patient's functional outcome.   REHAB POTENTIAL: Good  CLINICAL DECISION MAKING: Evolving/moderate complexity  EVALUATION COMPLEXITY: Moderate   PLAN:  PT FREQUENCY: 1-2x/week  prefers 1x/week due to work schedule  PT DURATION: 8  weeks  PLANNED INTERVENTIONS: Therapeutic exercises, Therapeutic activity, Neuromuscular re-education, Balance training, Gait training, Patient/Family education, Self Care, Joint mobilization, Stair training, Vestibular training, Canalith repositioning, and Manual therapy  PLAN FOR NEXT SESSION: FGA, mCTSIB, 5xSTS, gait speed, progress VOR exercises and balance exercises.    Jena Gauss, PT, DPT 01/24/2023, 10:13 AM    PHYSICAL THERAPY DISCHARGE SUMMARY  Visits from Start of Care: 1  Current functional level related to goals / functional outcomes: See above   Remaining  deficits: See above   Education / Equipment: HEP  Plan: Patient did not return to therapy after 01/22/23 evaluation, as it has been 30+ days she would require new order to return to therapy.    Jena Gauss, PT, DPT 03/08/2023 3:17 PM

## 2023-01-24 ENCOUNTER — Ambulatory Visit (HOSPITAL_BASED_OUTPATIENT_CLINIC_OR_DEPARTMENT_OTHER)
Admission: RE | Admit: 2023-01-24 | Discharge: 2023-01-24 | Disposition: A | Discharge status: Home / Self Care | Payer: Medicare Other | Source: Ambulatory Visit | Attending: Internal Medicine | Admitting: Internal Medicine

## 2023-01-24 ENCOUNTER — Ambulatory Visit: Payer: Medicare Other | Attending: Family Medicine | Admitting: Physical Therapy

## 2023-01-24 ENCOUNTER — Encounter: Payer: Self-pay | Admitting: Physical Therapy

## 2023-01-24 ENCOUNTER — Encounter: Payer: Self-pay | Admitting: Internal Medicine

## 2023-01-24 ENCOUNTER — Ambulatory Visit (INDEPENDENT_AMBULATORY_CARE_PROVIDER_SITE_OTHER): Payer: Medicare Other | Admitting: Internal Medicine

## 2023-01-24 VITALS — BP 144/82 | HR 99 | Temp 98.4°F | Resp 18 | Ht 66.0 in | Wt 291.1 lb

## 2023-01-24 DIAGNOSIS — R42 Dizziness and giddiness: Secondary | ICD-10-CM | POA: Insufficient documentation

## 2023-01-24 DIAGNOSIS — M79661 Pain in right lower leg: Secondary | ICD-10-CM | POA: Insufficient documentation

## 2023-01-24 DIAGNOSIS — M6281 Muscle weakness (generalized): Secondary | ICD-10-CM | POA: Insufficient documentation

## 2023-01-24 DIAGNOSIS — M79604 Pain in right leg: Secondary | ICD-10-CM | POA: Diagnosis not present

## 2023-01-24 DIAGNOSIS — M7121 Synovial cyst of popliteal space [Baker], right knee: Secondary | ICD-10-CM | POA: Diagnosis not present

## 2023-01-24 DIAGNOSIS — M7989 Other specified soft tissue disorders: Secondary | ICD-10-CM | POA: Insufficient documentation

## 2023-01-24 DIAGNOSIS — R2681 Unsteadiness on feet: Secondary | ICD-10-CM | POA: Diagnosis not present

## 2023-01-24 DIAGNOSIS — R600 Localized edema: Secondary | ICD-10-CM | POA: Diagnosis not present

## 2023-01-24 NOTE — Progress Notes (Signed)
Subjective:    Patient ID: [name], female    DOB: 11-12-1955, 67 y.o.   MRN: [id number]  DOS:  01/24/2023 Type of visit - description: Acute  Patient noted the right leg to be slightly tender to palpation and swollen for the last few days. She points to the lateral side of the right calf  Went to physical therapy today for dizziness treatment they felt the area was warm and was recommended to come here.  No fever or chills No rash No injury No chest pain, difficulty breathing or palpitation. No recent airplane trip or prolonged car trip   Review of Systems See above   Past Medical History:  Diagnosis Date   Allergic rhinitis    Allergy    Anemia 07/17/2016   Anxiety    Asthma    seasonal   Blood transfusion without reported diagnosis    Breast cancer (HCC)    right   Bronchitis    Colon polyps    CPAP (continuous positive airway pressure) dependence    Depression    Diabetes mellitus    borderline but takes metformin   Edema    Family history of breast cancer in first degree relative    Fibromyalgia    GERD (gastroesophageal reflux disease)    Hyperglycemia 03/05/2017   Hyperlipidemia 08/21/2016   no meds   Hyperparathyroidism    Hypertension    controlled by medications   Joint pain    Neuromuscular disorder (HCC)    Fibromyalgia   Obesity    Osteoarthritis    RA   PONV (postoperative nausea and vomiting)    occasional   Pre-diabetes    Sleep apnea    wears cpap   Viral meningitis    Vitamin D deficiency     Past Surgical History:  Procedure Laterality Date   ABDOMINAL HYSTERECTOMY  1998   ADENOIDECTOMY     BACK SURGERY     09/21/21   BREAST EXCISIONAL BIOPSY Left 1993   benign   BREAST SURGERY     Tran flap due to breast cancer   CESAREAN SECTION  1988   COLONOSCOPY  08/14/2011   HAND SURGERY     dog bite, right hand   HAND SURGERY     trauma, left hand   KNEE ARTHROSCOPY     bilateral   left knee replacement  2010    LUMBAR WOUND DEBRIDEMENT N/A 10/07/2021   Procedure: LUMBAR WOUND DEBRIDEMENT/REVISION;  Surgeon: Coletta Memos, MD;  Location: [facility] OR;  Service: Neurosurgery;  Laterality: N/A;  3C/RM 21 to follow Dr Conchita Paris   MASTECTOMY  01/13/1994   Right breast   parathyroid resection     PARATHYROIDECTOMY     POLYPECTOMY     rectal abscess     SEPTOPLASTY  1980   SHOULDER ARTHROSCOPY Right 11/09/2015   Procedure: ARTHROSCOPY SHOULDER-acromioplasty, distal clavicle resection and debridement;  Surgeon: Marcene Corning, MD;  Location: [facility] OR;  Service: Orthopedics;  Laterality: Right;   shoulder arthroscopy     rotator cuff repair   TOE SURGERY Right    paronychia and adenoma removed   TONSILLECTOMY     TOTAL KNEE ARTHROPLASTY  06/18/2008   Daldorf   TUMOR REMOVAL  1982   , scalp    Current Outpatient Medications  Medication Instructions   acetaminophen (TYLENOL) 1,300 mg, Oral, Every 8 hours PRN   albuterol (VENTOLIN HFA) 108 (90 Base) MCG/ACT inhaler 2 puffs, Inhalation, Every 6 hours PRN  calcium carbonate (OSCAL) 1,500 mg, Oral, 2 times daily with meals,     cholecalciferol (VITAMIN D3) 1,000 Units, Oral, Daily   colchicine 0.6 mg, Oral, 2 times daily PRN   Cyanocobalamin (B-12) 1000 MCG CAPS 1 capsule, Oral, Daily   famotidine (PEPCID) 40 mg, Oral, Daily   furosemide (LASIX) 40 MG tablet TAKE 1 TABLET BY MOUTH  TWICE DAILY   hydrochlorothiazide (MICROZIDE) 12.5 mg, Oral, Daily   Iron (Ferrous Sulfate) 325 mg, Oral, Every other day   meloxicam (MOBIC) 7.5-15 mg, Oral, Daily PRN   metFORMIN (GLUCOPHAGE) 500 MG tablet TAKE 1 TABLET BY MOUTH TWICE  DAILY WITH A MEAL   Multiple Vitamins-Minerals (MULTIVITAMIN & MINERAL PO) 1 tablet, Oral, Daily   promethazine-dextromethorphan (PROMETHAZINE-DM) 6.25-15 MG/5ML syrup 5 mLs, Oral, 4 times daily PRN   telmisartan (MICARDIS) 20 mg, Oral, Daily       Objective:   Physical Exam BP (!) 144/82   Pulse 99   Temp 98.4 F (36.9 C) (Oral)   Resp 18    Ht 5\' 6"  (1.676 m)   Wt 291 lb 2 oz (132.1 kg)   SpO2 98%   BMI 46.99 kg/m  General:   Well developed, NAD, BMI noted. HEENT:  Normocephalic . Face symmetric, atraumatic Lungs:  CTA B Normal respiratory effort, no intercostal retractions, no accessory muscle use. Heart: RRR,  no murmur.  Lower extremities: L leg: Status post knee replacement, calf is soft and nontender.  Trace pretibial edema R leg:  Knee is slightly TTP anteriorly, no clear-cut effusion, calf is larger by about 1 inch in diameter compared to the other side, minimally tender when squeezed.  Skin is actually normal without redness or warmness.  No pretibial edema. Skin: Not pale. Not jaundice Neurologic:  alert & oriented X3.  Speech normal, gait appropriate for age and unassisted Psych--  Cognition and judgment appear intact.  Cooperative with normal attention span and concentration.  Behavior appropriate. No anxious or depressed appearing.      Assessment     67 year old female, PMH includes DM, HTN, high cholesterol, GERD, RA, morbid obesity, OSA, presents with:   Calf soreness- enlargement On clinical grounds no evidence of infection or cellulitis. Calf is definitely asymmetric, although my suspicions for a DVT is low we will proceed with ultrasound to rule out DVT. If test is negative I would recommend observation further discussion with PCP if needed. Call for alarming symptoms, see AVS

## 2023-01-24 NOTE — Patient Instructions (Signed)
Go to the first floor arrange for ultrasound of your right calf.  If there is no clot I would recommend observation for now.  Please reach out to your primary doctor if the pain continue.  Call anytime if fever, chills, increased swelling.

## 2023-01-26 DIAGNOSIS — M1711 Unilateral primary osteoarthritis, right knee: Secondary | ICD-10-CM | POA: Diagnosis not present

## 2023-01-31 ENCOUNTER — Ambulatory Visit: Payer: Medicare Other | Admitting: Physical Therapy

## 2023-02-02 DIAGNOSIS — M7121 Synovial cyst of popliteal space [Baker], right knee: Secondary | ICD-10-CM | POA: Diagnosis not present

## 2023-02-07 ENCOUNTER — Encounter: Payer: Medicare Other | Admitting: Physical Therapy

## 2023-02-08 ENCOUNTER — Telehealth: Payer: Self-pay

## 2023-02-08 NOTE — Telephone Encounter (Signed)
Pt called regarding medication Colchicine was it as needed for flare ups, spoke with Ladona Ridgel, NP verified with her yes it is as needed.Pt was advised, stated understand.

## 2023-02-14 ENCOUNTER — Encounter: Payer: Medicare Other | Admitting: Physical Therapy

## 2023-02-15 ENCOUNTER — Other Ambulatory Visit: Payer: Self-pay | Admitting: Family

## 2023-02-15 ENCOUNTER — Ambulatory Visit: Payer: Medicare Other | Admitting: Family Medicine

## 2023-02-15 ENCOUNTER — Encounter: Payer: Self-pay | Admitting: Family Medicine

## 2023-02-15 MED ORDER — CEPHALEXIN 500 MG PO CAPS: 2000.0000 mg | ORAL_CAPSULE | Freq: Once | ORAL | 0 refills | Status: AC

## 2023-02-16 MED ORDER — CEPHALEXIN 500 MG PO CAPS: 2000.0000 mg | ORAL_CAPSULE | Freq: Once | ORAL | 0 refills | Status: AC

## 2023-02-16 NOTE — Telephone Encounter (Signed)
Worthy Rancher B, FNP  You17 hours ago (9:06 PM)    Cephalexin 2000mg  (4 tabs)1 hour prior to the procedure.   RX sent.

## 2023-02-18 ENCOUNTER — Encounter: Payer: Self-pay | Admitting: Family Medicine

## 2023-02-19 ENCOUNTER — Other Ambulatory Visit: Payer: Self-pay | Admitting: Family Medicine

## 2023-02-19 MED ORDER — BUPROPION HCL ER (SR) 150 MG PO TB12: 150.0000 mg | ORAL_TABLET | Freq: Every day | ORAL | 2 refills | Status: DC

## 2023-02-21 ENCOUNTER — Encounter: Payer: Medicare Other | Admitting: Physical Therapy

## 2023-02-25 NOTE — Assessment & Plan Note (Signed)
Well controlled, no changes to meds. Encouraged heart healthy diet such as the DASH diet and exercise as tolerated.  °

## 2023-02-25 NOTE — Assessment & Plan Note (Signed)
Continue to monitor

## 2023-02-25 NOTE — Assessment & Plan Note (Signed)
hgba1c acceptable, minimize simple carbs. Increase exercise as tolerated. Continue current meds 

## 2023-02-25 NOTE — Assessment & Plan Note (Signed)
Supplement and monitor 

## 2023-02-25 NOTE — Assessment & Plan Note (Signed)
Encouraged DASH or MIND diet, decrease po intake and increase exercise as tolerated. Needs 7-8 hours of sleep nightly. Avoid trans fats, eat small, frequent meals every 4-5 hours with lean proteins, complex carbs and healthy fats. Minimize simple carbs, high fat foods and processed foods 

## 2023-02-25 NOTE — Assessment & Plan Note (Signed)
Encourage heart healthy diet such as MIND or DASH diet, increase exercise, avoid trans fats, simple carbohydrates and processed foods, consider a krill or fish or flaxseed oil cap daily.  Patient not takig statin 

## 2023-02-25 NOTE — Assessment & Plan Note (Signed)
Has been struggling recently

## 2023-02-25 NOTE — Assessment & Plan Note (Signed)
Following with rheumatology

## 2023-02-26 ENCOUNTER — Encounter: Payer: Self-pay | Admitting: Family Medicine

## 2023-02-26 ENCOUNTER — Ambulatory Visit (INDEPENDENT_AMBULATORY_CARE_PROVIDER_SITE_OTHER): Payer: Medicare Other | Admitting: Family Medicine

## 2023-02-26 VITALS — BP 130/82 | HR 94 | Temp 98.0°F | Resp 16 | Ht 66.0 in | Wt 286.0 lb

## 2023-02-26 DIAGNOSIS — I10 Essential (primary) hypertension: Secondary | ICD-10-CM

## 2023-02-26 DIAGNOSIS — M7121 Synovial cyst of popliteal space [Baker], right knee: Secondary | ICD-10-CM

## 2023-02-26 DIAGNOSIS — F418 Other specified anxiety disorders: Secondary | ICD-10-CM | POA: Diagnosis not present

## 2023-02-26 DIAGNOSIS — M069 Rheumatoid arthritis, unspecified: Secondary | ICD-10-CM

## 2023-02-26 DIAGNOSIS — G6289 Other specified polyneuropathies: Secondary | ICD-10-CM

## 2023-02-26 DIAGNOSIS — E559 Vitamin D deficiency, unspecified: Secondary | ICD-10-CM | POA: Diagnosis not present

## 2023-02-26 DIAGNOSIS — E782 Mixed hyperlipidemia: Secondary | ICD-10-CM | POA: Diagnosis not present

## 2023-02-26 DIAGNOSIS — E1140 Type 2 diabetes mellitus with diabetic neuropathy, unspecified: Secondary | ICD-10-CM

## 2023-02-26 DIAGNOSIS — Z7984 Long term (current) use of oral hypoglycemic drugs: Secondary | ICD-10-CM | POA: Diagnosis not present

## 2023-02-26 DIAGNOSIS — D649 Anemia, unspecified: Secondary | ICD-10-CM | POA: Diagnosis not present

## 2023-02-26 HISTORY — DX: Synovial cyst of popliteal space (Baker), right knee: M71.21

## 2023-02-26 LAB — CBC WITH DIFFERENTIAL/PLATELET
Basophils Absolute: 0.1 10*3/uL (ref 0.0–0.1)
Basophils Relative: 1.3 % (ref 0.0–3.0)
Eosinophils Absolute: 0.1 10*3/uL (ref 0.0–0.7)
Eosinophils Relative: 1.5 % (ref 0.0–5.0)
HCT: 39.7 % (ref 36.0–46.0)
Hemoglobin: 12.5 g/dL (ref 12.0–15.0)
Lymphocytes Relative: 28.4 % (ref 12.0–46.0)
Lymphs Abs: 2.3 10*3/uL (ref 0.7–4.0)
MCHC: 31.6 g/dL (ref 30.0–36.0)
MCV: 84.4 fL (ref 78.0–100.0)
Monocytes Absolute: 0.8 10*3/uL (ref 0.1–1.0)
Monocytes Relative: 9.5 % (ref 3.0–12.0)
Neutro Abs: 4.9 10*3/uL (ref 1.4–7.7)
Neutrophils Relative %: 59.3 % (ref 43.0–77.0)
Platelets: 291 10*3/uL (ref 150.0–400.0)
RBC: 4.7 Mil/uL (ref 3.87–5.11)
RDW: 17 % — ABNORMAL HIGH (ref 11.5–15.5)
WBC: 8.2 10*3/uL (ref 4.0–10.5)

## 2023-02-26 LAB — COMPREHENSIVE METABOLIC PANEL
ALT: 28 U/L (ref 0–35)
AST: 28 U/L (ref 0–37)
Albumin: 4.1 g/dL (ref 3.5–5.2)
Alkaline Phosphatase: 81 U/L (ref 39–117)
BUN: 14 mg/dL (ref 6–23)
CO2: 28 meq/L (ref 19–32)
Calcium: 10 mg/dL (ref 8.4–10.5)
Chloride: 96 meq/L (ref 96–112)
Creatinine, Ser: 1 mg/dL (ref 0.40–1.20)
GFR: 57.85 mL/min — ABNORMAL LOW (ref >60.00)
Glucose, Bld: 124 mg/dL — ABNORMAL HIGH (ref 70–99)
Potassium: 4.2 meq/L (ref 3.5–5.1)
Sodium: 136 meq/L (ref 135–145)
Total Bilirubin: 0.4 mg/dL (ref 0.2–1.2)
Total Protein: 7 g/dL (ref 6.0–8.3)

## 2023-02-26 LAB — MICROALBUMIN / CREATININE URINE RATIO
Creatinine,U: 54.2 mg/dL
Microalb Creat Ratio: 1.3 mg/g (ref 0.0–30.0)
Microalb, Ur: <0.7 mg/dL (ref 0.0–1.9)

## 2023-02-26 LAB — LIPID PANEL
Cholesterol: 184 mg/dL (ref 0–200)
HDL: 54.4 mg/dL (ref >39.00)
LDL Cholesterol: 94 mg/dL (ref 0–99)
NonHDL: 129.71
Total CHOL/HDL Ratio: 3
Triglycerides: 178 mg/dL — ABNORMAL HIGH (ref 0.0–149.0)
VLDL: 35.6 mg/dL (ref 0.0–40.0)

## 2023-02-26 LAB — VITAMIN B12: Vitamin B-12: 348 pg/mL (ref 211–911)

## 2023-02-26 LAB — TSH: TSH: 0.77 u[IU]/mL (ref 0.35–5.50)

## 2023-02-26 LAB — HEMOGLOBIN A1C: Hgb A1c MFr Bld: 6.6 % — ABNORMAL HIGH (ref 4.6–6.5)

## 2023-02-26 MED ORDER — HYDROXYZINE HCL 10 MG PO TABS
10.0000 mg | ORAL_TABLET | Freq: Three times a day (TID) | ORAL | 2 refills | Status: DC | PRN
Start: 2023-02-26 — End: 2023-06-12

## 2023-02-26 MED ORDER — DOXYCYCLINE HYCLATE 100 MG PO TABS: 100.0000 mg | ORAL_TABLET | Freq: Two times a day (BID) | ORAL | 0 refills | Status: DC

## 2023-02-26 MED ORDER — ARIPIPRAZOLE 2 MG PO TABS: 2.0000 mg | ORAL_TABLET | Freq: Every day | ORAL | 1 refills | Status: DC

## 2023-02-26 NOTE — Patient Instructions (Signed)

## 2023-02-26 NOTE — Assessment & Plan Note (Signed)
Repeat labs and try topical treatments for now

## 2023-02-26 NOTE — Progress Notes (Signed)
Subjective:    Patient ID: [name], female    DOB: [birth year], 67 y.o.   MRN: [id number]  Chief Complaint  Patient presents with   Depression    Depression     HPI Discussed the use of AI scribe software for clinical note transcription with the patient, who gave verbal consent to proceed.  History of Present Illness   The patient, with a history of neuropathy and weight issues, presents with severe emotional distress, insomnia, and a persistent cough. She reports feeling overwhelmed due to her mother's worsening dementia and her son-in-law's recent stroke. She has been assisting her daughter and caring for her grandchildren, which has added to her stress. She also mentions that her neuropathy is worsening, causing instability and sharp pains in her feet. She has tried various remedies, including dietary supplements and topical treatments, with limited success.  In addition to her emotional and neuropathic symptoms, the patient has been experiencing a persistent cough for about five weeks. She reports bringing up clear, thick phlegm when she coughs but denies any associated fever, chills, or headache. She also mentions a sore throat that has been present for the same duration.  The patient also discusses her struggle with weight loss. She has tried various programs in the past but has found it difficult to maintain progress. She mentions that her appetite has decreased since starting Wellbutrin. She also reports clenching her teeth in her sleep, which has been causing jaw pain.        Past Medical History:  Diagnosis Date   Allergic rhinitis    Allergy    Anemia 07/17/2016   Anxiety    Asthma    seasonal   Blood transfusion without reported diagnosis    Breast cancer (HCC)    right   Bronchitis    Colon polyps    CPAP (continuous positive airway pressure) dependence    Depression    Diabetes mellitus    borderline but takes metformin   Edema    Family history of  breast cancer in first degree relative    Fibromyalgia    GERD (gastroesophageal reflux disease)    Hyperglycemia 03/05/2017   Hyperlipidemia 08/21/2016   no meds   Hyperparathyroidism    Hypertension    controlled by medications   Joint pain    Neuromuscular disorder (HCC)    Fibromyalgia   Obesity    Osteoarthritis    RA   PONV (postoperative nausea and vomiting)    occasional   Pre-diabetes    Sleep apnea    wears cpap   Viral meningitis    Vitamin D deficiency     Past Surgical History:  Procedure Laterality Date   ABDOMINAL HYSTERECTOMY  1998   ADENOIDECTOMY     BACK SURGERY     09/21/21   BREAST EXCISIONAL BIOPSY Left 1993   benign   BREAST SURGERY     Tran flap due to breast cancer   CESAREAN SECTION  1988   COLONOSCOPY  08/14/2011   HAND SURGERY     dog bite, right hand   HAND SURGERY     trauma, left hand   KNEE ARTHROSCOPY     bilateral   left knee replacement  2010   LUMBAR WOUND DEBRIDEMENT N/A 10/07/2021   Procedure: LUMBAR WOUND DEBRIDEMENT/REVISION;  Surgeon: Coletta Memos, MD;  Location: [facility] OR;  Service: Neurosurgery;  Laterality: N/A;  3C/RM 21 to follow Dr Conchita Paris   MASTECTOMY  01/13/1994  Right breast   parathyroid resection     PARATHYROIDECTOMY     POLYPECTOMY     rectal abscess     SEPTOPLASTY  1980   SHOULDER ARTHROSCOPY Right 11/09/2015   Procedure: ARTHROSCOPY SHOULDER-acromioplasty, distal clavicle resection and debridement;  Surgeon: Marcene Corning, MD;  Location: [facility];  Service: Orthopedics;  Laterality: Right;   shoulder arthroscopy     rotator cuff repair   TOE SURGERY Right    paronychia and adenoma removed   TONSILLECTOMY     TOTAL KNEE ARTHROPLASTY  06/18/2008   [facility]   TUMOR REMOVAL  1982   , scalp    Family History  Problem Relation Age of Onset   Emphysema Father    Lung cancer Father        lung ca dx 70   Other Father        prostate issues   Diabetes Father    Breast cancer Maternal Aunt        dx late  63s   Colon cancer Maternal Aunt        dx 48s   Colon polyps Maternal Aunt    Prostate cancer Maternal Grandfather 45       d. 85y   Heart disease Paternal Grandfather    Heart attack Paternal Grandfather        d. 50y   Diabetes Paternal Grandfather    Asthma Daughter        "seasonal"   Arthritis Maternal Grandmother    Aneurysm Maternal Grandmother        d. brain aneurysm at 55   Arthritis Mother    Colon polyps Mother    Hypertension Mother    Sleep apnea Mother    Multiple sclerosis Sister    Breast cancer Sister 34       L IDC and DCIS; ER/PR+, Her2-   Fibrocystic breast disease Sister    Arthritis/Rheumatoid Sister    Breast cancer Sister    Cancer Maternal Uncle        d. mouth cancer at younger age; smoker   Other Paternal Uncle        muscle issues - couldn't walk or talk; d. 20y   Cancer Maternal Aunt        lymphoma, dx 46s   Ovarian cancer Maternal Aunt        dx 52s; d. late 75s   Lung cancer Maternal Uncle        d. 35y; former smoker   Throat cancer Cousin        maternal 1st cousin; smoker   Breast cancer Cousin 35       maternal 1st cousin   Other Paternal Uncle        prostate issues   Breast cancer Cousin        paternal 1st cousin dx late 62s   Throat cancer Cousin        maternal 1st cousin; used SL tobacco   Prostate cancer Cousin        maternal 1st cousin dx 64s   Esophageal cancer Neg Hx    Rectal cancer Neg Hx    Stomach cancer Neg Hx     Social History   Socioeconomic History   Marital status: Widowed    Spouse name: Not on file   Number of children: 1   Years of education: Not on file   Highest education level: 12th grade  Occupational History   Occupation: ACCOUNTING  Employer: [employer]  Tobacco Use   Smoking status: Never   Smokeless tobacco: Never  Vaping Use   Vaping status: Never Used  Substance and Sexual Activity   Alcohol use: Not Currently   Drug use: No   Sexual activity: Not Currently  Other  Topics Concern   Not on file  Social History Narrative   Not on file   Social Determinants of Health   Financial Resource Strain: Low Risk  (11/12/2022)   Overall Financial Resource Strain (CARDIA)    Difficulty of Paying Living Expenses: Not very hard  Food Insecurity: No Food Insecurity (11/12/2022)   Hunger Vital Sign    Worried About Running Out of Food in the Last Year: Never true    Ran Out of Food in the Last Year: Never true  Transportation Needs: No Transportation Needs (11/12/2022)   PRAPARE - Administrator, Civil Service (Medical): No    Lack of Transportation (Non-Medical): No  Physical Activity: Insufficiently Active (11/12/2022)   Exercise Vital Sign    Days of Exercise per Week: 2 days    Minutes of Exercise per Session: 20 min  Stress: No Stress Concern Present (11/12/2022)   [employer] of Occupational Health - Occupational Stress Questionnaire    Feeling of Stress : Only a little  Social Connections: Moderately Integrated (11/12/2022)   Social Connection and Isolation Panel [NHANES]    Frequency of Communication with Friends and Family: More than three times a week    Frequency of Social Gatherings with Friends and Family: Twice a week    Attends Religious Services: More than 4 times per year    Active Member of [employer] or Organizations: Yes    Attends Banker Meetings: More than 4 times per year    Marital Status: Widowed  Intimate Partner Violence: Not At Risk (12/20/2022)   Humiliation, Afraid, Rape, and Kick questionnaire    Fear of Current or Ex-Partner: No    Emotionally Abused: No    Physically Abused: No    Sexually Abused: No    Outpatient Medications Prior to Visit  Medication Sig Dispense Refill   acetaminophen (TYLENOL) 650 MG CR tablet Take 1,300 mg by mouth every 8 (eight) hours as needed for pain.     albuterol (VENTOLIN HFA) 108 (90 Base) MCG/ACT inhaler Inhale 2 puffs into the lungs every 6 (six) hours as needed for  wheezing or shortness of breath. 8 g 0   buPROPion (WELLBUTRIN SR) 150 MG 12 hr tablet Take 1 tablet (150 mg total) by mouth daily. 30 tablet 2   calcium carbonate (OSCAL) 1500 (600 Ca) MG TABS tablet Take 1,500 mg by mouth 2 (two) times daily with a meal.     cholecalciferol (VITAMIN D3) 25 MCG (1000 UNIT) tablet Take 1,000 Units by mouth daily.     colchicine 0.6 MG tablet Take 1 tablet (0.6 mg total) by mouth 2 (two) times daily as needed. 60 tablet 2   Cyanocobalamin (B-12) 1000 MCG CAPS Take 1 capsule by mouth daily at 6 (six) AM.     famotidine (PEPCID) 40 MG tablet Take 1 tablet (40 mg total) by mouth daily. 30 tablet 5   furosemide (LASIX) 40 MG tablet TAKE 1 TABLET BY MOUTH  TWICE DAILY (Patient taking differently: Take 40 mg by mouth daily.) 180 tablet 3   hydrochlorothiazide (MICROZIDE) 12.5 MG capsule Take 1 capsule (12.5 mg total) by mouth daily. 90 capsule 3   Iron, Ferrous  Sulfate, 325 (65 Fe) MG TABS Take 325 mg by mouth every other day. 30 tablet    meloxicam (MOBIC) 7.5 MG tablet Take 1-2 tablets (7.5-15 mg total) by mouth daily as needed for pain. 60 tablet 1   metFORMIN (GLUCOPHAGE) 500 MG tablet TAKE 1 TABLET BY MOUTH TWICE  DAILY WITH A MEAL 200 tablet 2   Multiple Vitamins-Minerals (MULTIVITAMIN & MINERAL PO) Take 1 tablet by mouth daily.     telmisartan (MICARDIS) 20 MG tablet Take 1 tablet (20 mg total) by mouth daily. 90 tablet 2   promethazine-dextromethorphan (PROMETHAZINE-DM) 6.25-15 MG/5ML syrup Take 5 mLs by mouth 4 (four) times daily as needed for cough. (Patient not taking: Reported on 01/24/2023) 118 mL 0   No facility-administered medications prior to visit.    Allergies  Allergen Reactions   Allopurinol Hives   Mucinex [Guaifenesin Er] Shortness Of Breath   Lyrica [Pregabalin] Swelling   Ozempic (0.25 Or 0.5 Mg-Dose) [Semaglutide(0.25 Or 0.5mg -Dos)] Other (See Comments)    Abdominal pain   Penicillins Hives and Other (See Comments)    Has patient had a  PCN reaction causing immediate rash, facial/tongue/throat swelling, SOB or lightheadedness with hypotension: no Has patient had a PCN reaction causing severe rash involving mucus membranes or skin necrosis: no Has patient had a PCN reaction that required hospitalization no Has patient had a PCN reaction occurring within the last 10 years: no If all of the above answers are "NO", then may proceed with Cephalosporin use.    Prozac [Fluoxetine Hcl] Hives   Amitriptyline Other (See Comments)    "felt weird, fatigue, dizziness"   Lexapro [Escitalopram Oxalate]     Nausea and hypersalivation.     Review of Systems  Constitutional:  Positive for malaise/fatigue. Negative for fever.  HENT:  Negative for congestion.   Eyes:  Negative for blurred vision.  Respiratory:  Negative for shortness of breath.   Cardiovascular:  Positive for palpitations. Negative for chest pain and leg swelling.  Gastrointestinal:  Negative for abdominal pain, blood in stool and nausea.  Genitourinary:  Negative for dysuria and frequency.  Musculoskeletal:  Negative for falls.  Skin:  Negative for rash.  Neurological:  Negative for dizziness, loss of consciousness and headaches.  Endo/Heme/Allergies:  Negative for environmental allergies.  Psychiatric/Behavioral:  Positive for depression. Negative for hallucinations, memory loss, substance abuse and suicidal ideas. The patient is nervous/anxious.        Objective:    Physical Exam Constitutional:      General: She is not in acute distress.    Appearance: Normal appearance. She is well-developed. She is not toxic-appearing.  HENT:     Head: Normocephalic and atraumatic.     Right Ear: External ear normal.     Left Ear: External ear normal.     Nose: Nose normal.  Eyes:     General:        Right eye: No discharge.        Left eye: No discharge.     Conjunctiva/sclera: Conjunctivae normal.  Neck:     Thyroid: No thyromegaly.  Cardiovascular:     Rate and  Rhythm: Normal rate and regular rhythm.     Heart sounds: Normal heart sounds. No murmur heard. Pulmonary:     Effort: Pulmonary effort is normal. No respiratory distress.     Breath sounds: Normal breath sounds.  Abdominal:     General: Bowel sounds are normal.     Palpations: Abdomen is soft.  Tenderness: There is no abdominal tenderness. There is no guarding.  Musculoskeletal:        General: Normal range of motion.     Cervical back: Neck supple.  Lymphadenopathy:     Cervical: No cervical adenopathy.  Skin:    General: Skin is warm and dry.  Neurological:     Mental Status: She is alert and oriented to person, place, and time.  Psychiatric:        Mood and Affect: Mood normal.        Behavior: Behavior normal.        Thought Content: Thought content normal.        Judgment: Judgment normal.     BP 130/82 (BP Location: Left Arm, Patient Position: Sitting, Cuff Size: Large)   Pulse 94   Temp 98 F (36.7 C) (Oral)   Resp 16   Ht 5\' 6"  (1.676 m)   Wt 286 lb (129.7 kg)   SpO2 95%   BMI 46.16 kg/m  Wt Readings from Last 3 Encounters:  02/26/23 286 lb (129.7 kg)  01/24/23 291 lb 2 oz (132.1 kg)  11/13/22 281 lb 9.6 oz (127.7 kg)    Diabetic Foot Exam - Simple   No data filed    Lab Results  Component Value Date   WBC 8.3 11/13/2022   HGB 11.3 (L) 11/13/2022   HCT 35.7 (L) 11/13/2022   PLT 320.0 11/13/2022   GLUCOSE 122 (H) 11/13/2022   CHOL 165 11/13/2022   TRIG 184.0 (H) 11/13/2022   HDL 41.30 11/13/2022   LDLDIRECT 110.0 08/21/2016   LDLCALC 87 11/13/2022   ALT 17 11/13/2022   AST 21 11/13/2022   NA 139 11/13/2022   K 4.3 11/13/2022   CL 98 11/13/2022   CREATININE 0.95 11/13/2022   BUN 15 11/13/2022   CO2 30 11/13/2022   TSH 0.90 11/13/2022   INR 2.0 (H) 06/21/2008   HGBA1C 6.7 (H) 11/13/2022    Lab Results  Component Value Date   TSH 0.90 11/13/2022   Lab Results  Component Value Date   WBC 8.3 11/13/2022   HGB 11.3 (L) 11/13/2022    HCT 35.7 (L) 11/13/2022   MCV 80.4 11/13/2022   PLT 320.0 11/13/2022   Lab Results  Component Value Date   NA 139 11/13/2022   K 4.3 11/13/2022   CO2 30 11/13/2022   GLUCOSE 122 (H) 11/13/2022   BUN 15 11/13/2022   CREATININE 0.95 11/13/2022   BILITOT 0.6 11/13/2022   ALKPHOS 72 11/13/2022   AST 21 11/13/2022   ALT 17 11/13/2022   PROT 7.6 11/13/2022   ALBUMIN 4.1 11/13/2022   CALCIUM 10.1 11/13/2022   ANIONGAP 12 07/13/2022   GFR 61.65 11/13/2022   Lab Results  Component Value Date   CHOL 165 11/13/2022   Lab Results  Component Value Date   HDL 41.30 11/13/2022   Lab Results  Component Value Date   LDLCALC 87 11/13/2022   Lab Results  Component Value Date   TRIG 184.0 (H) 11/13/2022   Lab Results  Component Value Date   CHOLHDL 4 11/13/2022   Lab Results  Component Value Date   HGBA1C 6.7 (H) 11/13/2022       Assessment & Plan:  Depression with anxiety Assessment & Plan: Has been struggling recently  Orders: -     Ambulatory referral to Behavioral Health  Type 2 diabetes mellitus with diabetic neuropathy, without long-term current use of insulin (HCC) Assessment & Plan: hgba1c acceptable,  minimize simple carbs. Increase exercise as tolerated. Continue current meds   Orders: -     Microalbumin / creatinine urine ratio -     Comprehensive metabolic panel -     Hemoglobin A1c  Essential hypertension Assessment & Plan: Well controlled, no changes to meds. Encouraged heart healthy diet such as the DASH diet and exercise as tolerated.    Orders: -     TSH  Mixed hyperlipidemia Assessment & Plan: Encourage heart healthy diet such as MIND or DASH diet, increase exercise, avoid trans fats, simple carbohydrates and processed foods, consider a krill or fish or flaxseed oil cap daily.  Patient not takig statin  Orders: -     Lipid panel  Morbid obesity (HCC) Assessment & Plan: Encouraged DASH or MIND diet, decrease po intake and increase exercise  as tolerated. Needs 7-8 hours of sleep nightly. Avoid trans fats, eat small, frequent meals every 4-5 hours with lean proteins, complex carbs and healthy fats. Minimize simple carbs, high fat foods and processed foods    Vitamin D deficiency Assessment & Plan: Supplement and monitor    Rheumatoid arthritis involving multiple sites, unspecified whether rheumatoid factor present [facility]) Assessment & Plan: Following with rheumatology   Anemia, unspecified type -     CBC with Differential/Platelet -     Iron, TIBC and Ferritin Panel  Baker's cyst of knee, right Assessment & Plan: Was drained and is feeling better   Other polyneuropathy Assessment & Plan: Repeat labs and try topical treatments for now  Orders: -     Vitamin B12 -     Vitamin B1 -     Vitamin B6 -     RPR  Other orders -     ARIPiprazole; Take 1 tablet (2 mg total) by mouth daily.  Dispense: 30 tablet; Refill: 1 -     hydrOXYzine HCl; Take 1-2 tablets (10-20 mg total) by mouth 3 (three) times daily as needed.  Dispense: 60 tablet; Refill: 2 -     Doxycycline Hyclate; Take 1 tablet (100 mg total) by mouth 2 (two) times daily.  Dispense: 20 tablet; Refill: 0    Assessment and Plan    Acute Stress Reaction Significant stressors including mother's worsening dementia and son-in-law's recent stroke. Reports insomnia, restlessness, and inability to calm mind. Currently on Wellbutrin. -Add Abilify 2mg  at bedtime for mood stabilization. -Consider counseling for additional support, patient to decide and inform via message.  Peripheral Neuropathy Reports worsening sharp pain and numbness in feet, up to ankles. Describes as electrical sensation. -Continue current management strategies including warm socks and shoes. -Consider use of capsaicin cream for vasodilation and potential pain relief. -Check B12, B1, B6, and Vitamin D levels to rule out deficiencies contributing to neuropathy. -Order RPR to rule out syphilis as  a potential contributing factor.  Chronic Cough and Sore Throat Reports a cough and sore throat for approximately 5 weeks. No fever, chills, or headache. -Prescribe Doxycycline twice daily for 10 days. -Consider chest x-ray if no improvement.  Weight Management Patient acknowledges need for weight loss, particularly in relation to potential knee replacement. -Consider referral to [facility] Wellness team for additional support.  General Health Maintenance -Start Hydroxyzine 10mg  as needed for anxiety. -Consider use of Seasonal Affective Disorder light for mood improvement. -Check liver function tests due to potential impact of current supplements. -Declined flu shot, already received.         Danise Edge, MD

## 2023-02-26 NOTE — Assessment & Plan Note (Signed)
Was drained and is feeling better

## 2023-02-28 ENCOUNTER — Encounter: Payer: Medicare Other | Admitting: Physical Therapy

## 2023-02-28 DIAGNOSIS — M1711 Unilateral primary osteoarthritis, right knee: Secondary | ICD-10-CM | POA: Diagnosis not present

## 2023-03-01 LAB — VITAMIN B6: Vitamin B6: 2.6 ng/mL (ref 2.1–21.7)

## 2023-03-01 LAB — IRON,TIBC AND FERRITIN PANEL
%SAT: 11 % — ABNORMAL LOW (ref 16–45)
Ferritin: 43 ng/mL (ref 16–288)
Iron: 43 ug/dL — ABNORMAL LOW (ref 45–160)
TIBC: 378 ug/dL (ref 250–450)

## 2023-03-01 LAB — VITAMIN B1: Vitamin B1 (Thiamine): 10 nmol/L (ref 8–30)

## 2023-03-01 LAB — RPR: RPR Ser Ql: NONREACTIVE

## 2023-03-04 ENCOUNTER — Encounter: Payer: Self-pay | Admitting: Family Medicine

## 2023-03-07 DIAGNOSIS — M1711 Unilateral primary osteoarthritis, right knee: Secondary | ICD-10-CM | POA: Diagnosis not present

## 2023-03-09 ENCOUNTER — Encounter: Payer: Self-pay | Admitting: Family Medicine

## 2023-03-14 DIAGNOSIS — M1711 Unilateral primary osteoarthritis, right knee: Secondary | ICD-10-CM | POA: Diagnosis not present

## 2023-03-19 ENCOUNTER — Encounter (HOSPITAL_BASED_OUTPATIENT_CLINIC_OR_DEPARTMENT_OTHER): Payer: Self-pay | Admitting: Urology

## 2023-03-19 ENCOUNTER — Telehealth: Payer: Self-pay | Admitting: Family Medicine

## 2023-03-19 ENCOUNTER — Emergency Department (HOSPITAL_BASED_OUTPATIENT_CLINIC_OR_DEPARTMENT_OTHER): Payer: Medicare Other | Admitting: Radiology

## 2023-03-19 ENCOUNTER — Other Ambulatory Visit: Payer: Self-pay | Admitting: Family Medicine

## 2023-03-19 ENCOUNTER — Ambulatory Visit
Admission: EM | Admit: 2023-03-19 | Discharge: 2023-03-19 | Disposition: A | Discharge status: Home / Self Care | Payer: Medicare Other | Attending: Internal Medicine | Admitting: Internal Medicine

## 2023-03-19 ENCOUNTER — Other Ambulatory Visit: Payer: Self-pay

## 2023-03-19 ENCOUNTER — Emergency Department (HOSPITAL_BASED_OUTPATIENT_CLINIC_OR_DEPARTMENT_OTHER)
Admission: EM | Admit: 2023-03-19 | Discharge: 2023-03-19 | Disposition: A | Discharge status: Home / Self Care | Payer: Medicare Other | Attending: Emergency Medicine | Admitting: Emergency Medicine

## 2023-03-19 DIAGNOSIS — R079 Chest pain, unspecified: Secondary | ICD-10-CM | POA: Diagnosis not present

## 2023-03-19 DIAGNOSIS — R0789 Other chest pain: Secondary | ICD-10-CM

## 2023-03-19 LAB — TROPONIN I (HIGH SENSITIVITY)
Troponin I (High Sensitivity): 3 ng/L (ref <18)
Troponin I (High Sensitivity): 3 ng/L (ref <18)

## 2023-03-19 LAB — CBC
HCT: 37.9 % (ref 36.0–46.0)
Hemoglobin: 12.3 g/dL (ref 12.0–15.0)
MCH: 27.2 pg (ref 26.0–34.0)
MCHC: 32.5 g/dL (ref 30.0–36.0)
MCV: 83.7 fL (ref 80.0–100.0)
Platelets: 359 10*3/uL (ref 150–400)
RBC: 4.53 MIL/uL (ref 3.87–5.11)
RDW: 15.5 % (ref 11.5–15.5)
WBC: 10.2 10*3/uL (ref 4.0–10.5)
nRBC: 0 % (ref 0.0–0.2)

## 2023-03-19 LAB — BASIC METABOLIC PANEL
Anion gap: 8 (ref 5–15)
BUN: 16 mg/dL (ref 8–23)
CO2: 32 mmol/L (ref 22–32)
Calcium: 10.2 mg/dL (ref 8.9–10.3)
Chloride: 99 mmol/L (ref 98–111)
Creatinine, Ser: 1.02 mg/dL — ABNORMAL HIGH (ref 0.44–1.00)
GFR, Estimated: 60 mL/min — ABNORMAL LOW (ref >60)
Glucose, Bld: 122 mg/dL — ABNORMAL HIGH (ref 70–99)
Potassium: 3.3 mmol/L — ABNORMAL LOW (ref 3.5–5.1)
Sodium: 139 mmol/L (ref 135–145)

## 2023-03-19 MED ORDER — OLANZAPINE 2.5 MG PO TABS: 2.5000 mg | ORAL_TABLET | Freq: Every day | ORAL | 3 refills | Status: DC

## 2023-03-19 NOTE — Telephone Encounter (Signed)
Pt was seen at Urgent care/ ED today.

## 2023-03-19 NOTE — ED Triage Notes (Signed)
Pt reports let sided chest pain and pain under right breast x 2-3 weeks; right sided chest pain, palpitations, headache, nausea,  started today. States she also felt throat and ears felt weirs. Pt contacted PCP and was told to go to the ED.

## 2023-03-19 NOTE — ED Notes (Signed)
Patient is being discharged from the Urgent Care and sent to the Emergency Department via POV . Per Urban Gibson, PA-C, patient is in need of higher level of care due to CP. Patient is aware and verbalizes understanding of plan of care.  Vitals:   03/19/23 1343  BP: 138/85  Pulse: 96  Resp: 18  Temp: 98.9 F (37.2 C)  SpO2: 96%

## 2023-03-19 NOTE — Discharge Instructions (Signed)
While you were in the emergency department, you had a blood test to look for signs of injury to your heart that were normal.  You also had an EKG done that was normal.  Your chest x-ray was normal.  Including your discharge paperwork is a telephone number for cardiology office.  If you not receive a phone call from them by Wednesday, please call the telephone number to make an appointment.  Please follow-up with your primary care doctor.

## 2023-03-19 NOTE — Telephone Encounter (Signed)
Pt was transferred to speak with Triage nurse after she stated that it felt like her heart was "skipping beats" and it felt like intense/heavy beating.

## 2023-03-19 NOTE — ED Triage Notes (Signed)
Pt states left sided chest pain x 5 weeks  States worsening today and states Resting HR has been elevated  Denies SOB, denies N/V  Seen at UC earlier sent here for further work up

## 2023-03-19 NOTE — Telephone Encounter (Signed)
Initial Comment Caller has chest pain on left side (earlier this AM) and feels irregular, pounding heartbeat under neck on right side. Translation No Nurse Assessment Nurse: Para March, RN, Jasmine Date/Time (Eastern Time): 03/19/2023 12:46:57 PM Confirm and document reason for call. If symptomatic, describe symptoms. ---caller states having a pounding and irregular rate and was earlier this morning, states feeling a pounding heart rate under her right neck. no current CP. No SOB current. Does the patient have any new or worsening symptoms? ---Yes Will a triage be completed? ---Yes Related visit to physician within the last 2 weeks? ---No Does the PT have any chronic conditions? (i.e. diabetes, asthma, this includes High risk factors for pregnancy, etc.) ---Yes List chronic conditions. ---Ability, Wellbutrin, Lasix, DM Is this a behavioral health or substance abuse call? ---No Guidelines Guideline Title Affirmed Question Affirmed Notes Nurse Date/Time (Eastern Time) Chest Pain [1] Chest pain (or "angina") comes and goes AND [2] is happening more often (increasing in frequency) or getting worse (increasing in severity) (Exception: Para March, RN, [facility] 03/19/2023 12:49:27 PM PLEASE NOTE: All timestamps contained within this report are represented as [place] Standard Time. CONFIDENTIALTY NOTICE: This fax transmission is intended only for the addressee. It contains information that is legally privileged, confidential or otherwise protected from use or disclosure. If you are not the intended recipient, you are strictly prohibited from reviewing, disclosing, copying using or disseminating any of this information or taking any action in reliance on or regarding this information. If you have received this fax in error, please notify us immediately by telephone so that we can arrange for its return to [name]. Phone: [phone], Toll-Free: [phone], Fax: [phone] Page: 2 of 2 Call  Id: [id number] Guidelines Guideline Title Affirmed Question Affirmed Notes Nurse Date/Time Lamount Cohen Time) Chest pains that last only a few seconds.) Disp. Time Lamount Cohen Time) Disposition Final User 03/19/2023 12:44:58 PM Send to Urgent Ellin Goodie 03/19/2023 12:53:45 PM Go to ED Now Yes Para March, RN, [facility] Final Disposition 03/19/2023 12:53:45 PM Go to ED Now Yes Para March, RN, [facility] Disagree/Comply Comply Caller Understands Yes PreDisposition Call Doctor Care Advice Given Per Guideline GO TO ED NOW: * You need to be seen in the Emergency Department. * Go to the ED at ___________ Hospital. * Leave now. Drive carefully. ANOTHER ADULT SHOULD DRIVE: * It is better and safer if another adult drives instead of you. NOTHING BY MOUTH: * Do not eat or drink anything for now. BRING MEDICINES: * Bring a list of your current medicines when you go to the Emergency Department (ER). Comments User: Charisse March, RN Date/Time Lamount Cohen Time): 03/19/2023 12:54:57 PM Caller to have a friend that is there with her and felt her chest and heart beathing. Will take her to ED NOW Referrals [facility] - ED

## 2023-03-19 NOTE — ED Provider Notes (Signed)
[place] EMERGENCY DEPARTMENT AT [facility] Provider Note   CSN: [id number] Arrival date & time: 03/19/23  1748     History  Chief Complaint  Patient presents with   Chest Pain    [name] is a 67 y.o. female.  This is a 67 year old female presents emergency department today due to an episode of chest pain.  Patient says that she was sitting at work, began to feel some pain on the right side of her chest, felt as though she could not swallow, and felt some fullness in her ears.  Symptoms resolved, however she still had some pain on her chest when she pressed on it.  She went to an urgent care, who recommended she go to the emergency department for further evaluation.   Chest Pain      Home Medications Prior to Admission medications   Medication Sig Start Date End Date Taking? Authorizing Provider  acetaminophen (TYLENOL) 650 MG CR tablet Take 1,300 mg by mouth every 8 (eight) hours as needed for pain.    [provider]  albuterol (VENTOLIN HFA) 108 (90 Base) MCG/ACT inhaler Inhale 2 puffs into the lungs every 6 (six) hours as needed for wheezing or shortness of breath. 03/22/22   Sandford Craze, NP  buPROPion (WELLBUTRIN SR) 150 MG 12 hr tablet Take 1 tablet (150 mg total) by mouth daily. 02/19/23   Bradd Canary, MD  calcium carbonate (OSCAL) 1500 (600 Ca) MG TABS tablet Take 1,500 mg by mouth 2 (two) times daily with a meal.    [provider]  cholecalciferol (VITAMIN D3) 25 MCG (1000 UNIT) tablet Take 1,000 Units by mouth daily.    [provider]  colchicine 0.6 MG tablet Take 1 tablet (0.6 mg total) by mouth 2 (two) times daily as needed. 08/17/22   Bradd Canary, MD  Cyanocobalamin (B-12) 1000 MCG CAPS Take 1 capsule by mouth daily at 6 (six) AM. 11/21/21   Sandford Craze, NP  doxycycline (VIBRA-TABS) 100 MG tablet Take 1 tablet (100 mg total) by mouth 2 (two) times daily. 02/26/23   Bradd Canary, MD   famotidine (PEPCID) 40 MG tablet Take 1 tablet (40 mg total) by mouth daily. 12/11/18   Bradd Canary, MD  furosemide (LASIX) 40 MG tablet TAKE 1 TABLET BY MOUTH  TWICE DAILY Patient taking differently: Take 40 mg by mouth daily. 05/24/21   Bradd Canary, MD  hydrochlorothiazide (MICROZIDE) 12.5 MG capsule Take 1 capsule (12.5 mg total) by mouth daily. 01/15/23   Bradd Canary, MD  hydrOXYzine (ATARAX) 10 MG tablet Take 1-2 tablets (10-20 mg total) by mouth 3 (three) times daily as needed. 02/26/23   Bradd Canary, MD  Iron, Ferrous Sulfate, 325 (65 Fe) MG TABS Take 325 mg by mouth every other day. 11/21/21   Sandford Craze, NP  meloxicam (MOBIC) 7.5 MG tablet Take 1-2 tablets (7.5-15 mg total) by mouth daily as needed for pain. 10/10/22   Bradd Canary, MD  metFORMIN (GLUCOPHAGE) 500 MG tablet TAKE 1 TABLET BY MOUTH TWICE  DAILY WITH A MEAL 09/04/22   Bradd Canary, MD  Multiple Vitamins-Minerals (MULTIVITAMIN & MINERAL PO) Take 1 tablet by mouth daily.    [provider]  OLANZapine (ZYPREXA) 2.5 MG tablet Take 1 tablet (2.5 mg total) by mouth at bedtime. 03/19/23   Bradd Canary, MD  telmisartan (MICARDIS) 20 MG tablet Take 1 tablet (20 mg total) by mouth daily. 01/15/23  Bradd Canary, MD      Allergies    Allopurinol, Mucinex [guaifenesin er], Lyrica [pregabalin], Ozempic (0.25 or 0.5 mg-dose) [semaglutide(0.25 or 0.5mg -dos)], Penicillins, Prozac [fluoxetine hcl], Amitriptyline, and Lexapro [escitalopram oxalate]    Review of Systems   Review of Systems  Cardiovascular:  Positive for chest pain.    Physical Exam Updated Vital Signs BP 116/69   Pulse 88   Temp 98.5 F (36.9 C)   Resp (!) 22   Ht 5\' 6"  (1.676 m)   Wt 130 kg   SpO2 97%   BMI 46.26 kg/m  Physical Exam Vitals reviewed.  HENT:     Head: Normocephalic.  Cardiovascular:     Rate and Rhythm: Normal rate and regular rhythm.     Heart sounds: Normal heart sounds.     No friction rub.   Pulmonary:     Effort: Pulmonary effort is normal. No tachypnea or respiratory distress.  Chest:     Chest wall: Tenderness present.  Abdominal:     Palpations: Abdomen is soft.  Neurological:     Mental Status: She is alert.     ED Results / Procedures / Treatments   Labs (all labs ordered are listed, but only abnormal results are displayed) Labs Reviewed  BASIC METABOLIC PANEL - Abnormal; Notable for the following components:      Result Value   Potassium 3.3 (*)    Glucose, Bld 122 (*)    Creatinine, Ser 1.02 (*)    GFR, Estimated 60 (*)    All other components within normal limits  CBC  TROPONIN I (HIGH SENSITIVITY)  TROPONIN I (HIGH SENSITIVITY)    EKG EKG Interpretation Date/Time:  Monday March 19 2023 17:58:37 EDT Ventricular Rate:  106 PR Interval:  148 QRS Duration:  138 QT Interval:  390 QTC Calculation: 518 R Axis:   83  Text Interpretation: Sinus tachycardia Right bundle branch block Abnormal ECG When compared with ECG of 19-Mar-2023 13:30, No significant change was found Confirmed by Anders Simmonds [phone]) on 03/19/2023 9:27:26 PM  Radiology DG Chest 2 View  Result Date: 03/19/2023 CLINICAL DATA:  Left-sided chest pain for several weeks, initial encounter EXAM: CHEST - 2 VIEW COMPARISON:  12/04/2019 FINDINGS: The heart size and mediastinal contours are within normal limits. Both lungs are clear. The visualized skeletal structures are unremarkable. IMPRESSION: No active cardiopulmonary disease. Electronically Signed   By: Alcide Clever M.D.   On: 03/19/2023 21:47    Procedures Procedures    Medications Ordered in ED Medications - No data to display  ED Course/ Medical Decision Making/ A&P                                 Medical Decision Making This is a 67 year old female is presenting to the emergency department today due to chest pain.  Differential diagnoses include ACS, chest wall pain, costochondritis, pneumonia, pneumothorax.  Plan-my  dependent review the patient's EKG shows stable right bundle branch block.  No evidence of acute ischemia.  My independent review the patient's chest x-ray shows no pneumonia.  Patient's initial troponin 3.  Patient with reproducible right-sided chest wall pain.  Story less concern for acute coronary syndrome.  Wells score 0.  Story not consistent with dissection.  Will repeat troponin, and if flat, would have patient follow-up outpatient with cardiology.  Reassessment 10:20 PM-patient's second troponin 3.  Low risk for acute coronary syndrome.  Will discharge patient, have her follow-up with outpatient cardiology.  Return precaution discussed with patient at bedside.  She was agreeable with this plan.  Amount and/or Complexity of Data Reviewed Labs: ordered. Radiology: ordered.           Final Clinical Impression(s) / ED Diagnoses Final diagnoses:  Chest pain, unspecified type    Rx / DC Orders ED Discharge Orders          Ordered    Ambulatory referral to Cardiology       Comments: If you have not heard from the Cardiology office within the next 72 hours please call [phone].   03/19/23 2223              Anders Simmonds T, DO 03/19/23 2224

## 2023-03-19 NOTE — ED Provider Notes (Signed)
[facility] - URGENT CARE CENTER  Note:  This document was prepared using Conservation officer, historic buildings and may include unintentional dictation errors.  MRN: [id number] DOB: 10/03/1955  Subjective:   [name] is a 67 y.o. female presenting for 3-week history of persistent intermittent left-sided chest pain that has now moved across the sternum toward the right side.  Occasionally she gets palpitations, nausea and headaches with it, has been more prominent today.  Has started to feel ear fullness and throat discomfort.  She contacted her PCP and was advised to go to the emergency room.  However, patient did not want to wait in the emergency room and therefore decided to come to our urgent care clinic.  No history of heart disease, MI.  She does have a history of obstructive sleep apnea, hypertension, hyperlipidemia, obesity, diabetes.  She also has a history of asthma.  No smoking of any kind including cigarettes, cigars, vaping, marijuana use.  No drug use.  No current facility-administered medications for this encounter.  Current Outpatient Medications:    acetaminophen (TYLENOL) 650 MG CR tablet, Take 1,300 mg by mouth every 8 (eight) hours as needed for pain., Disp: , Rfl:    albuterol (VENTOLIN HFA) 108 (90 Base) MCG/ACT inhaler, Inhale 2 puffs into the lungs every 6 (six) hours as needed for wheezing or shortness of breath., Disp: 8 g, Rfl: 0   ARIPiprazole (ABILIFY) 2 MG tablet, Take 1 tablet (2 mg total) by mouth daily., Disp: 30 tablet, Rfl: 1   buPROPion (WELLBUTRIN SR) 150 MG 12 hr tablet, Take 1 tablet (150 mg total) by mouth daily., Disp: 30 tablet, Rfl: 2   calcium carbonate (OSCAL) 1500 (600 Ca) MG TABS tablet, Take 1,500 mg by mouth 2 (two) times daily with a meal., Disp: , Rfl:    cholecalciferol (VITAMIN D3) 25 MCG (1000 UNIT) tablet, Take 1,000 Units by mouth daily., Disp: , Rfl:    colchicine 0.6 MG tablet, Take 1 tablet (0.6 mg total) by mouth 2 (two) times  daily as needed., Disp: 60 tablet, Rfl: 2   Cyanocobalamin (B-12) 1000 MCG CAPS, Take 1 capsule by mouth daily at 6 (six) AM., Disp: , Rfl:    doxycycline (VIBRA-TABS) 100 MG tablet, Take 1 tablet (100 mg total) by mouth 2 (two) times daily., Disp: 20 tablet, Rfl: 0   famotidine (PEPCID) 40 MG tablet, Take 1 tablet (40 mg total) by mouth daily., Disp: 30 tablet, Rfl: 5   furosemide (LASIX) 40 MG tablet, TAKE 1 TABLET BY MOUTH  TWICE DAILY (Patient taking differently: Take 40 mg by mouth daily.), Disp: 180 tablet, Rfl: 3   hydrochlorothiazide (MICROZIDE) 12.5 MG capsule, Take 1 capsule (12.5 mg total) by mouth daily., Disp: 90 capsule, Rfl: 3   hydrOXYzine (ATARAX) 10 MG tablet, Take 1-2 tablets (10-20 mg total) by mouth 3 (three) times daily as needed., Disp: 60 tablet, Rfl: 2   Iron, Ferrous Sulfate, 325 (65 Fe) MG TABS, Take 325 mg by mouth every other day., Disp: 30 tablet, Rfl:    meloxicam (MOBIC) 7.5 MG tablet, Take 1-2 tablets (7.5-15 mg total) by mouth daily as needed for pain., Disp: 60 tablet, Rfl: 1   metFORMIN (GLUCOPHAGE) 500 MG tablet, TAKE 1 TABLET BY MOUTH TWICE  DAILY WITH A MEAL, Disp: 200 tablet, Rfl: 2   Multiple Vitamins-Minerals (MULTIVITAMIN & MINERAL PO), Take 1 tablet by mouth daily., Disp: , Rfl:    telmisartan (MICARDIS) 20 MG tablet, Take 1 tablet (20 mg  total) by mouth daily., Disp: 90 tablet, Rfl: 2   Allergies  Allergen Reactions   Allopurinol Hives   Mucinex [Guaifenesin Er] Shortness Of Breath   Lyrica [Pregabalin] Swelling   Ozempic (0.25 Or 0.5 Mg-Dose) [Semaglutide(0.25 Or 0.5mg -Dos)] Other (See Comments)    Abdominal pain   Penicillins Hives and Other (See Comments)    Has patient had a PCN reaction causing immediate rash, facial/tongue/throat swelling, SOB or lightheadedness with hypotension: no Has patient had a PCN reaction causing severe rash involving mucus membranes or skin necrosis: no Has patient had a PCN reaction that required hospitalization  no Has patient had a PCN reaction occurring within the last 10 years: no If all of the above answers are "NO", then may proceed with Cephalosporin use.    Prozac [Fluoxetine Hcl] Hives   Amitriptyline Other (See Comments)    "felt weird, fatigue, dizziness"   Lexapro [Escitalopram Oxalate]     Nausea and hypersalivation.     Past Medical History:  Diagnosis Date   Allergic rhinitis    Allergy    Anemia 07/17/2016   Anxiety    Asthma    seasonal   Blood transfusion without reported diagnosis    Breast cancer (HCC)    right   Bronchitis    Colon polyps    CPAP (continuous positive airway pressure) dependence    Depression    Diabetes mellitus    borderline but takes metformin   Edema    Family history of breast cancer in first degree relative    Fibromyalgia    GERD (gastroesophageal reflux disease)    Hyperglycemia 03/05/2017   Hyperlipidemia 08/21/2016   no meds   Hyperparathyroidism    Hypertension    controlled by medications   Joint pain    Neuromuscular disorder (HCC)    Fibromyalgia   Obesity    Osteoarthritis    RA   PONV (postoperative nausea and vomiting)    occasional   Pre-diabetes    Sleep apnea    wears cpap   Viral meningitis    Vitamin D deficiency      Past Surgical History:  Procedure Laterality Date   ABDOMINAL HYSTERECTOMY  1998   ADENOIDECTOMY     BACK SURGERY     09/21/21   BREAST EXCISIONAL BIOPSY Left 1993   benign   BREAST SURGERY     Tran flap due to breast cancer   CESAREAN SECTION  1988   COLONOSCOPY  08/14/2011   HAND SURGERY     dog bite, right hand   HAND SURGERY     trauma, left hand   KNEE ARTHROSCOPY     bilateral   left knee replacement  2010   LUMBAR WOUND DEBRIDEMENT N/A 10/07/2021   Procedure: LUMBAR WOUND DEBRIDEMENT/REVISION;  Surgeon: Coletta Memos, MD;  Location: [facility] OR;  Service: Neurosurgery;  Laterality: N/A;  3C/RM 21 to follow Dr Conchita Paris   MASTECTOMY  01/13/1994   Right breast   parathyroid resection      PARATHYROIDECTOMY     POLYPECTOMY     rectal abscess     SEPTOPLASTY  1980   SHOULDER ARTHROSCOPY Right 11/09/2015   Procedure: ARTHROSCOPY SHOULDER-acromioplasty, distal clavicle resection and debridement;  Surgeon: Marcene Corning, MD;  Location: [facility] OR;  Service: Orthopedics;  Laterality: Right;   shoulder arthroscopy     rotator cuff repair   TOE SURGERY Right    paronychia and adenoma removed   TONSILLECTOMY     TOTAL KNEE  ARTHROPLASTY  06/18/2008   [facility]   TUMOR REMOVAL  1982   , scalp    Family History  Problem Relation Age of Onset   Emphysema Father    Lung cancer Father        lung ca dx 62   Other Father        prostate issues   Diabetes Father    Breast cancer Maternal Aunt        dx late 15s   Colon cancer Maternal Aunt        dx 70s   Colon polyps Maternal Aunt    Prostate cancer Maternal Grandfather 84       d. 85y   Heart disease Paternal Grandfather    Heart attack Paternal Grandfather        d. 50y   Diabetes Paternal Grandfather    Asthma Daughter        "seasonal"   Arthritis Maternal Grandmother    Aneurysm Maternal Grandmother        d. brain aneurysm at 7   Arthritis Mother    Colon polyps Mother    Hypertension Mother    Sleep apnea Mother    Multiple sclerosis Sister    Breast cancer Sister 66       L IDC and DCIS; ER/PR+, Her2-   Fibrocystic breast disease Sister    Arthritis/Rheumatoid Sister    Breast cancer Sister    Cancer Maternal Uncle        d. mouth cancer at younger age; smoker   Other Paternal Uncle        muscle issues - couldn't walk or talk; d. 20y   Cancer Maternal Aunt        lymphoma, dx 37s   Ovarian cancer Maternal Aunt        dx 19s; d. late 56s   Lung cancer Maternal Uncle        d. 50y; former smoker   Throat cancer Cousin        maternal 1st cousin; smoker   Breast cancer Cousin 32       maternal 1st cousin   Other Paternal Uncle        prostate issues   Breast cancer Cousin        paternal 1st  cousin dx late 21s   Throat cancer Cousin        maternal 1st cousin; used SL tobacco   Prostate cancer Cousin        maternal 1st cousin dx 25s   Esophageal cancer Neg Hx    Rectal cancer Neg Hx    Stomach cancer Neg Hx     Social History   Tobacco Use   Smoking status: Never   Smokeless tobacco: Never  Vaping Use   Vaping status: Never Used  Substance Use Topics   Alcohol use: Not Currently   Drug use: No    ROS   Objective:   Vitals: BP 138/85 (BP Location: Left Arm)   Pulse 96   Temp 98.9 F (37.2 C) (Oral)   Resp 18   SpO2 96%   Physical Exam Constitutional:      General: She is not in acute distress.    Appearance: Normal appearance. She is well-developed. She is not ill-appearing, toxic-appearing or diaphoretic.  HENT:     Head: Normocephalic and atraumatic.     Nose: Nose normal.     Mouth/Throat:     Mouth: Mucous membranes are moist.  Pharynx: No pharyngeal swelling, oropharyngeal exudate, posterior oropharyngeal erythema or uvula swelling.     Tonsils: No tonsillar exudate or tonsillar abscesses. 0 on the right. 0 on the left.  Eyes:     General: No scleral icterus.       Right eye: No discharge.        Left eye: No discharge.     Extraocular Movements: Extraocular movements intact.  Cardiovascular:     Rate and Rhythm: Normal rate and regular rhythm.     Heart sounds: Normal heart sounds. No murmur heard.    No friction rub. No gallop.  Pulmonary:     Effort: Pulmonary effort is normal. No respiratory distress.     Breath sounds: No stridor. No wheezing, rhonchi or rales.  Chest:     Chest wall: Tenderness (reproducible over area outlined) present.    Skin:    General: Skin is warm and dry.  Neurological:     General: No focal deficit present.     Mental Status: She is alert and oriented to person, place, and time.  Psychiatric:        Mood and Affect: Mood normal.        Behavior: Behavior normal.     ED ECG REPORT   Date:  03/19/2023  EKG Time: 3:27 PM  Rate: 93bpm  Rhythm: normal sinus rhythm,  RBBB  Axis: rightward  Intervals:right bundle branch block  ST&T Change: Nonspecific T wave inversion in lead III  Narrative Interpretation: Sinus rhythm at 93bpm with a rightward axis, right bundle branch block and nonspecific T wave change and a solitary lead.  Very comparable to previous EKGs.   Assessment and Plan :   PDMP not reviewed this encounter.  1. Atypical chest pain    Discussed etiologies and possibility of musculoskeletal chest wall pain given her reproducible chest pain.  However, patient rightfully has concerns about her heart and given her risk factors is reasonable to warrant further cardiac workup.  Patient plans on presenting to the emergency room today.  She declined further workup through our clinic.   Wallis Bamberg, PA-C 03/19/23 1529

## 2023-03-22 ENCOUNTER — Ambulatory Visit: Payer: Medicare Other | Admitting: Family Medicine

## 2023-03-26 ENCOUNTER — Ambulatory Visit (INDEPENDENT_AMBULATORY_CARE_PROVIDER_SITE_OTHER): Payer: Medicare Other | Admitting: Psychology

## 2023-03-26 DIAGNOSIS — F418 Other specified anxiety disorders: Secondary | ICD-10-CM
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
## 2023-03-28 ENCOUNTER — Other Ambulatory Visit: Payer: Self-pay | Admitting: Family Medicine

## 2023-03-28 MED ORDER — ZOLPIDEM TARTRATE 10 MG PO TABS: 5.0000 mg | ORAL_TABLET | Freq: Every evening | ORAL | 2 refills | Status: DC | PRN

## 2023-04-04 ENCOUNTER — Telehealth: Payer: Self-pay | Admitting: *Deleted

## 2023-04-04 NOTE — Telephone Encounter (Signed)
Received Letter from [employer] stating pt's insurance has suspended the claim from date of service 02/26/23 and is asking for [name] to fax them additional documentation (OV note) from that DOS along with signed form. Called pt, explained request and pt gave verbal consent to fax office note from that DOS.  Office note and signed form faxed to [phone] Attn: [employer].

## 2023-04-11 NOTE — Assessment & Plan Note (Signed)
Hydrate and monitor, has not been taking Uloric will check Uric acid today

## 2023-04-11 NOTE — Assessment & Plan Note (Signed)
Encourage heart healthy diet such as MIND or DASH diet, increase exercise, avoid trans fats, simple carbohydrates and processed foods, consider a krill or fish or flaxseed oil cap daily.  °

## 2023-04-11 NOTE — Assessment & Plan Note (Signed)
Encouraged moist heat and gentle stretching as tolerated. May try NSAIDs and prescription meds as directed and report if symptoms worsen or seek immediate care 

## 2023-04-11 NOTE — Assessment & Plan Note (Signed)
Encouraged good sleep hygiene such as dark, quiet room. No blue/green glowing lights such as computer screens in bedroom. No alcohol or stimulants in evening. Cut down on caffeine as able. Regular exercise is helpful but not just prior to bed time.  

## 2023-04-11 NOTE — Assessment & Plan Note (Signed)
hgba1c acceptable, minimize simple carbs. Increase exercise as tolerated. Continue current meds 

## 2023-04-11 NOTE — Assessment & Plan Note (Signed)
Well controlled, no changes to meds. Encouraged heart healthy diet such as the DASH diet and exercise as tolerated.  °

## 2023-04-11 NOTE — Assessment & Plan Note (Signed)
Continues to struggle but has been tolerating new meds

## 2023-04-11 NOTE — Assessment & Plan Note (Signed)
Supplement and monitor 

## 2023-04-12 ENCOUNTER — Encounter: Payer: Self-pay | Admitting: Family Medicine

## 2023-04-12 ENCOUNTER — Telehealth: Payer: Medicare Other | Admitting: Family Medicine

## 2023-04-12 ENCOUNTER — Other Ambulatory Visit (HOSPITAL_BASED_OUTPATIENT_CLINIC_OR_DEPARTMENT_OTHER): Payer: Self-pay

## 2023-04-12 DIAGNOSIS — E1140 Type 2 diabetes mellitus with diabetic neuropathy, unspecified: Secondary | ICD-10-CM

## 2023-04-12 DIAGNOSIS — F418 Other specified anxiety disorders: Secondary | ICD-10-CM

## 2023-04-12 DIAGNOSIS — G4700 Insomnia, unspecified: Secondary | ICD-10-CM | POA: Diagnosis not present

## 2023-04-12 DIAGNOSIS — M109 Gout, unspecified: Secondary | ICD-10-CM | POA: Diagnosis not present

## 2023-04-12 DIAGNOSIS — E559 Vitamin D deficiency, unspecified: Secondary | ICD-10-CM | POA: Diagnosis not present

## 2023-04-12 DIAGNOSIS — E782 Mixed hyperlipidemia: Secondary | ICD-10-CM | POA: Diagnosis not present

## 2023-04-12 DIAGNOSIS — M069 Rheumatoid arthritis, unspecified: Secondary | ICD-10-CM

## 2023-04-12 DIAGNOSIS — I10 Essential (primary) hypertension: Secondary | ICD-10-CM

## 2023-04-12 MED ORDER — TIRZEPATIDE 2.5 MG/0.5ML ~~LOC~~ SOAJ
2.5000 mg | SUBCUTANEOUS | 1 refills | Status: DC
  Filled 2023-04-12: qty 2, 28d supply, fill #0
  Filled 2023-05-04: qty 2, 28d supply, fill #1

## 2023-04-12 MED ORDER — ARIPIPRAZOLE 2 MG PO TABS
2.0000 mg | ORAL_TABLET | Freq: Every day | ORAL | 2 refills | Status: DC
  Filled 2023-04-12 – 2023-04-19 (×2): qty 30, 30d supply, fill #0
  Filled 2023-05-04 – 2023-05-14 (×2): qty 30, 30d supply, fill #1

## 2023-04-12 NOTE — Progress Notes (Signed)
MyChart Video Visit    Virtual Visit via Video Note   This patient is at least at moderate risk for complications without adequate follow up. This format is felt to be most appropriate for this patient at this time. Physical exam was limited by quality of the video and audio technology used for the visit. Juanetta, CMA was able to get the patient set up on a video visit.  Patient location: home Patient and provider in visit Provider location: Office  I discussed the limitations of evaluation and management by telemedicine and the availability of in person appointments. The patient expressed understanding and agreed to proceed.  Visit Date: 04/12/2023  Today's healthcare provider: Danise Edge, MD     Subjective:    Patient ID: [name], female    DOB: [birth year], 67 y.o.   MRN: [id number]  Chief Complaint  Patient presents with   Follow-up    HPI Discussed the use of AI scribe software for clinical note transcription with the patient, who gave verbal consent to proceed.  History of Present Illness   The patient presents with bloating, which they suspect may be due to their medication (GLP-1s and olanzapine). They also report feeling anxious and needing a "jump start". They have been crying less but still feel the need to "start doing that blowing again". The patient is currently on a low dose of medication and is considering increasing the dose or switching to a different medication. They have previously had negative reactions to Abilify and another unspecified medication, which made them feel like they were swelling up. The patient is also dealing with a lack of energy and difficulty sleeping. They are currently taking Wellbutrin and Ambien, and are considering adding hydroxyzine and magnesium glycinate to their regimen. The patient is also dealing with family stressors, including a sick mother, a daughter with a swollen foot from a tick bite, and a son-in-law with a  blood clot in his neck.        Past Medical History:  Diagnosis Date   Allergic rhinitis    Allergy    Anemia 07/17/2016   Anxiety    Asthma    seasonal   Blood transfusion without reported diagnosis    Breast cancer (HCC)    right   Bronchitis    Colon polyps    CPAP (continuous positive airway pressure) dependence    Depression    Diabetes mellitus    borderline but takes metformin   Edema    Family history of breast cancer in first degree relative    Fibromyalgia    GERD (gastroesophageal reflux disease)    Hyperglycemia 03/05/2017   Hyperlipidemia 08/21/2016   no meds   Hyperparathyroidism    Hypertension    controlled by medications   Joint pain    Neuromuscular disorder (HCC)    Fibromyalgia   Obesity    Osteoarthritis    RA   PONV (postoperative nausea and vomiting)    occasional   Pre-diabetes    Sleep apnea    wears cpap   Viral meningitis    Vitamin D deficiency     Past Surgical History:  Procedure Laterality Date   ABDOMINAL HYSTERECTOMY  1998   ADENOIDECTOMY     BACK SURGERY     09/21/21   BREAST EXCISIONAL BIOPSY Left 1993   benign   BREAST SURGERY     Tran flap due to breast cancer   CESAREAN SECTION  1988   COLONOSCOPY  08/14/2011   HAND SURGERY     dog bite, right hand   HAND SURGERY     trauma, left hand   KNEE ARTHROSCOPY     bilateral   left knee replacement  2010   LUMBAR WOUND DEBRIDEMENT N/A 10/07/2021   Procedure: LUMBAR WOUND DEBRIDEMENT/REVISION;  Surgeon: Coletta Memos, MD;  Location: [facility] OR;  Service: Neurosurgery;  Laterality: N/A;  3C/RM 21 to follow Dr Conchita Paris   MASTECTOMY  01/13/1994   Right breast   parathyroid resection     PARATHYROIDECTOMY     POLYPECTOMY     rectal abscess     SEPTOPLASTY  1980   SHOULDER ARTHROSCOPY Right 11/09/2015   Procedure: ARTHROSCOPY SHOULDER-acromioplasty, distal clavicle resection and debridement;  Surgeon: Marcene Corning, MD;  Location: [facility] OR;  Service: Orthopedics;  Laterality:  Right;   shoulder arthroscopy     rotator cuff repair   TOE SURGERY Right    paronychia and adenoma removed   TONSILLECTOMY     TOTAL KNEE ARTHROPLASTY  06/18/2008   [facility]   TUMOR REMOVAL  1982   , scalp    Family History  Problem Relation Age of Onset   Emphysema Father    Lung cancer Father        lung ca dx 79   Other Father        prostate issues   Diabetes Father    Breast cancer Maternal Aunt        dx late 21s   Colon cancer Maternal Aunt        dx 48s   Colon polyps Maternal Aunt    Prostate cancer Maternal Grandfather 21       d. 85y   Heart disease Paternal Grandfather    Heart attack Paternal Grandfather        d. 50y   Diabetes Paternal Grandfather    Asthma Daughter        "seasonal"   Arthritis Maternal Grandmother    Aneurysm Maternal Grandmother        d. brain aneurysm at 68   Arthritis Mother    Colon polyps Mother    Hypertension Mother    Sleep apnea Mother    Multiple sclerosis Sister    Breast cancer Sister 95       L IDC and DCIS; ER/PR+, Her2-   Fibrocystic breast disease Sister    Arthritis/Rheumatoid Sister    Breast cancer Sister    Cancer Maternal Uncle        d. mouth cancer at younger age; smoker   Other Paternal Uncle        muscle issues - couldn't walk or talk; d. 20y   Cancer Maternal Aunt        lymphoma, dx 43s   Ovarian cancer Maternal Aunt        dx 55s; d. late 79s   Lung cancer Maternal Uncle        d. 89y; former smoker   Throat cancer Cousin        maternal 1st cousin; smoker   Breast cancer Cousin 58       maternal 1st cousin   Other Paternal Uncle        prostate issues   Breast cancer Cousin        paternal 1st cousin dx late 65s   Throat cancer Cousin        maternal 1st cousin; used SL tobacco   Prostate cancer Cousin  maternal 1st cousin dx 39s   Esophageal cancer Neg Hx    Rectal cancer Neg Hx    Stomach cancer Neg Hx     Social History   Socioeconomic History   Marital status: Widowed     Spouse name: Not on file   Number of children: 1   Years of education: Not on file   Highest education level: 12th grade  Occupational History   Occupation: ACCOUNTING    Employer: [employer]  Tobacco Use   Smoking status: Never   Smokeless tobacco: Never  Vaping Use   Vaping status: Never Used  Substance and Sexual Activity   Alcohol use: Not Currently   Drug use: No   Sexual activity: Not Currently  Other Topics Concern   Not on file  Social History Narrative   Not on file   Social Determinants of Health   Financial Resource Strain: Low Risk  (11/12/2022)   Overall Financial Resource Strain (CARDIA)    Difficulty of Paying Living Expenses: Not very hard  Food Insecurity: No Food Insecurity (11/12/2022)   Hunger Vital Sign    Worried About Running Out of Food in the Last Year: Never true    Ran Out of Food in the Last Year: Never true  Transportation Needs: No Transportation Needs (11/12/2022)   PRAPARE - Administrator, Civil Service (Medical): No    Lack of Transportation (Non-Medical): No  Physical Activity: Insufficiently Active (11/12/2022)   Exercise Vital Sign    Days of Exercise per Week: 2 days    Minutes of Exercise per Session: 20 min  Stress: No Stress Concern Present (11/12/2022)   [employer] of Occupational Health - Occupational Stress Questionnaire    Feeling of Stress : Only a little  Social Connections: Moderately Integrated (11/12/2022)   Social Connection and Isolation Panel [NHANES]    Frequency of Communication with Friends and Family: More than three times a week    Frequency of Social Gatherings with Friends and Family: Twice a week    Attends Religious Services: More than 4 times per year    Active Member of [employer] or Organizations: Yes    Attends Banker Meetings: More than 4 times per year    Marital Status: Widowed  Intimate Partner Violence: Not At Risk (12/20/2022)   Humiliation, Afraid, Rape, and Kick  questionnaire    Fear of Current or Ex-Partner: No    Emotionally Abused: No    Physically Abused: No    Sexually Abused: No    Outpatient Medications Prior to Visit  Medication Sig Dispense Refill   acetaminophen (TYLENOL) 650 MG CR tablet Take 1,300 mg by mouth every 8 (eight) hours as needed for pain.     albuterol (VENTOLIN HFA) 108 (90 Base) MCG/ACT inhaler Inhale 2 puffs into the lungs every 6 (six) hours as needed for wheezing or shortness of breath. 8 g 0   buPROPion (WELLBUTRIN SR) 150 MG 12 hr tablet Take 1 tablet (150 mg total) by mouth daily. 30 tablet 2   calcium carbonate (OSCAL) 1500 (600 Ca) MG TABS tablet Take 1,500 mg by mouth 2 (two) times daily with a meal.     cholecalciferol (VITAMIN D3) 25 MCG (1000 UNIT) tablet Take 1,000 Units by mouth daily.     colchicine 0.6 MG tablet Take 1 tablet (0.6 mg total) by mouth 2 (two) times daily as needed. 60 tablet 2   Cyanocobalamin (B-12) 1000 MCG CAPS Take 1  capsule by mouth daily at 6 (six) AM.     famotidine (PEPCID) 40 MG tablet Take 1 tablet (40 mg total) by mouth daily. 30 tablet 5   furosemide (LASIX) 40 MG tablet TAKE 1 TABLET BY MOUTH  TWICE DAILY (Patient taking differently: Take 40 mg by mouth daily.) 180 tablet 3   hydrochlorothiazide (MICROZIDE) 12.5 MG capsule Take 1 capsule (12.5 mg total) by mouth daily. 90 capsule 3   hydrOXYzine (ATARAX) 10 MG tablet Take 1-2 tablets (10-20 mg total) by mouth 3 (three) times daily as needed. 60 tablet 2   Iron, Ferrous Sulfate, 325 (65 Fe) MG TABS Take 325 mg by mouth every other day. 30 tablet    meloxicam (MOBIC) 7.5 MG tablet Take 1-2 tablets (7.5-15 mg total) by mouth daily as needed for pain. 60 tablet 1   metFORMIN (GLUCOPHAGE) 500 MG tablet TAKE 1 TABLET BY MOUTH TWICE  DAILY WITH A MEAL 200 tablet 2   Multiple Vitamins-Minerals (MULTIVITAMIN & MINERAL PO) Take 1 tablet by mouth daily.     OLANZapine (ZYPREXA) 2.5 MG tablet Take 1 tablet (2.5 mg total) by mouth at bedtime.  30 tablet 3   telmisartan (MICARDIS) 20 MG tablet Take 1 tablet (20 mg total) by mouth daily. 90 tablet 2   zolpidem (AMBIEN) 10 MG tablet Take 0.5-1 tablets (5-10 mg total) by mouth at bedtime as needed for sleep. 30 tablet 2   doxycycline (VIBRA-TABS) 100 MG tablet Take 1 tablet (100 mg total) by mouth 2 (two) times daily. 20 tablet 0   No facility-administered medications prior to visit.    Allergies  Allergen Reactions   Allopurinol Hives   Mucinex [Guaifenesin Er] Shortness Of Breath   Lyrica [Pregabalin] Swelling   Ozempic (0.25 Or 0.5 Mg-Dose) [Semaglutide(0.25 Or 0.5mg -Dos)] Other (See Comments)    Abdominal pain   Penicillins Hives and Other (See Comments)    Has patient had a PCN reaction causing immediate rash, facial/tongue/throat swelling, SOB or lightheadedness with hypotension: no Has patient had a PCN reaction causing severe rash involving mucus membranes or skin necrosis: no Has patient had a PCN reaction that required hospitalization no Has patient had a PCN reaction occurring within the last 10 years: no If all of the above answers are "NO", then may proceed with Cephalosporin use.    Prozac [Fluoxetine Hcl] Hives   Amitriptyline Other (See Comments)    "felt weird, fatigue, dizziness"   Lexapro [Escitalopram Oxalate]     Nausea and hypersalivation.     Review of Systems  Constitutional:  Negative for fever and malaise/fatigue.  HENT:  Negative for congestion.   Eyes:  Negative for blurred vision.  Respiratory:  Negative for shortness of breath.   Cardiovascular:  Negative for chest pain, palpitations and leg swelling.  Gastrointestinal:  Negative for abdominal pain, blood in stool and nausea.  Genitourinary:  Negative for dysuria and frequency.  Musculoskeletal:  Negative for falls.  Skin:  Negative for rash.  Neurological:  Negative for dizziness, loss of consciousness and headaches.  Endo/Heme/Allergies:  Negative for environmental allergies.   Psychiatric/Behavioral:  Negative for depression. The patient is not nervous/anxious.        Objective:    Physical Exam Constitutional:      General: She is not in acute distress.    Appearance: Normal appearance. She is not ill-appearing or toxic-appearing.  HENT:     Head: Normocephalic and atraumatic.     Right Ear: External ear normal.  Left Ear: External ear normal.     Nose: Nose normal.  Eyes:     General:        Right eye: No discharge.        Left eye: No discharge.  Pulmonary:     Effort: Pulmonary effort is normal.  Skin:    Findings: No rash.  Neurological:     Mental Status: She is alert and oriented to person, place, and time.  Psychiatric:        Behavior: Behavior normal.     There were no vitals taken for this visit. Wt Readings from Last 3 Encounters:  03/19/23 286 lb 9.6 oz (130 kg)  02/26/23 286 lb (129.7 kg)  01/24/23 291 lb 2 oz (132.1 kg)       Assessment & Plan:  Depression with anxiety Assessment & Plan: Continues to struggle but has been tolerating new meds   Type 2 diabetes mellitus with diabetic neuropathy, without long-term current use of insulin (HCC) Assessment & Plan: hgba1c acceptable, minimize simple carbs. Increase exercise as tolerated. Continue current meds    Essential hypertension Assessment & Plan: Well controlled, no changes to meds. Encouraged heart healthy diet such as the DASH diet and exercise as tolerated.     Gout of big toe Assessment & Plan: Hydrate and monitor, has not been taking Uloric will check Uric acid today   Mixed hyperlipidemia Assessment & Plan: Encourage heart healthy diet such as MIND or DASH diet, increase exercise, avoid trans fats, simple carbohydrates and processed foods, consider a krill or fish or flaxseed oil cap daily.    Insomnia, unspecified type Assessment & Plan: Encouraged good sleep hygiene such as dark, quiet room. No blue/green glowing lights such as computer screens  in bedroom. No alcohol or stimulants in evening. Cut down on caffeine as able. Regular exercise is helpful but not just prior to bed time.  Ambien has helped some, will refill   Rheumatoid arthritis involving multiple sites, unspecified whether rheumatoid factor present [facility]) Assessment & Plan: Encouraged moist heat and gentle stretching as tolerated. May try NSAIDs and prescription meds as directed and report if symptoms worsen or seek immediate care    Vitamin D deficiency Assessment & Plan: Supplement and monitor    Other orders -     Tirzepatide; Inject 2.5 mg into the skin once a week.  Dispense: 2 mL; Refill: 1 -     ARIPiprazole; Take 1 tablet (2 mg total) by mouth daily.  Dispense: 30 tablet; Refill: 2     Assessment and Plan    Mood Disorder Patient reports anxiety and a need for a "jump start." Previous trials of Abilify and another unspecified medication resulted in perceived swelling. Patient is currently on a low dose of Wellbutrin. Discussed options of increasing current medication or switching to a different medication such as Seroquel. Patient decided to retry Abilify. -Retry Abilify at patient's discretion. -If patient experiences abdominal pain or other adverse effects, patient to contact office for further management.  Insomnia Patient reports difficulty sleeping and waking up early in the morning. Currently taking Ambien, which has provided some relief. Patient also has hydroxyzine available but was unsure if it could be taken with other medications. Discussed potential benefits of magnesium glycinate for sleep. -Continue Ambien as needed for sleep. -Take hydroxyzine at dinner time, followed by Abilify an hour later, and Ambien an hour after that if not overly sedated. -Start magnesium glycinate half an hour before bed.  Follow-up Patient  is currently seeing a counselor once a month and finds it helpful. Patient to keep appointment on the 26th of this month to  assess tolerance of Abilify. If Abilify is tolerated and helpful, patient can cancel this appointment. Another follow-up appointment to be scheduled in 10-12 weeks.         I discussed the assessment and treatment plan with the patient. The patient was provided an opportunity to ask questions and all were answered. The patient agreed with the plan and demonstrated an understanding of the instructions.   The patient was advised to call back or seek an in-person evaluation if the symptoms worsen or if the condition fails to improve as anticipated.  Danise Edge, MD [facility] at [facility] [phone] (phone) [phone] (fax)  [facility]

## 2023-04-12 NOTE — Patient Instructions (Signed)
Insomnia Insomnia is a sleep disorder that makes it difficult to fall asleep or stay asleep. Insomnia can cause fatigue, low energy, difficulty concentrating, mood swings, and poor performance at work or school. There are three different ways to classify insomnia: Difficulty falling asleep. Difficulty staying asleep. Waking up too early in the morning. Any type of insomnia can be long-term (chronic) or short-term (acute). Both are common. Short-term insomnia usually lasts for 3 months or less. Chronic insomnia occurs at least three times a week for longer than 3 months. What are the causes? Insomnia may be caused by another condition, situation, or substance, such as: Having certain mental health conditions, such as anxiety and depression. Using caffeine, alcohol, tobacco, or drugs. Having gastrointestinal conditions, such as gastroesophageal reflux disease (GERD). Having certain medical conditions. These include: Asthma. Alzheimer's disease. Stroke. Chronic pain. An overactive thyroid gland (hyperthyroidism). Other sleep disorders, such as restless legs syndrome and sleep apnea. Menopause. Sometimes, the cause of insomnia may not be known. What increases the risk? Risk factors for insomnia include: Gender. Females are affected more often than males. Age. Insomnia is more common as people get older. Stress and certain medical and mental health conditions. Lack of exercise. Having an irregular work schedule. This may include working night shifts and traveling between different time zones. What are the signs or symptoms? If you have insomnia, the main symptom is having trouble falling asleep or having trouble staying asleep. This may lead to other symptoms, such as: Feeling tired or having low energy. Feeling nervous about going to sleep. Not feeling rested in the morning. Having trouble concentrating. Feeling irritable, anxious, or depressed. How is this diagnosed? This condition  may be diagnosed based on: Your symptoms and medical history. Your health care provider may ask about: Your sleep habits. Any medical conditions you have. Your mental health. A physical exam. How is this treated? Treatment for insomnia depends on the cause. Treatment may focus on treating an underlying condition that is causing the insomnia. Treatment may also include: Medicines to help you sleep. Counseling or therapy. Lifestyle adjustments to help you sleep better. Follow these instructions at home: Eating and drinking  Limit or avoid alcohol, caffeinated beverages, and products that contain nicotine and tobacco, especially close to bedtime. These can disrupt your sleep. Do not eat a large meal or eat spicy foods right before bedtime. This can lead to digestive discomfort that can make it hard for you to sleep. Sleep habits  Keep a sleep diary to help you and your health care provider figure out what could be causing your insomnia. Write down: When you sleep. When you wake up during the night. How well you sleep and how rested you feel the next day. Any side effects of medicines you are taking. What you eat and drink. Make your bedroom a dark, comfortable place where it is easy to fall asleep. Put up shades or blackout curtains to block light from outside. Use a white noise machine to block noise. Keep the temperature cool. Limit screen use before bedtime. This includes: Not watching TV. Not using your smartphone, tablet, or computer. Stick to a routine that includes going to bed and waking up at the same times every day and night. This can help you fall asleep faster. Consider making a quiet activity, such as reading, part of your nighttime routine. Try to avoid taking naps during the day so that you sleep better at night. Get out of bed if you are still awake after   15 minutes of trying to sleep. Keep the lights down, but try reading or doing a quiet activity. When you feel  sleepy, go back to bed. General instructions Take over-the-counter and prescription medicines only as told by your health care provider. Exercise regularly as told by your health care provider. However, avoid exercising in the hours right before bedtime. Use relaxation techniques to manage stress. Ask your health care provider to suggest some techniques that may work well for you. These may include: Breathing exercises. Routines to release muscle tension. Visualizing peaceful scenes. Make sure that you drive carefully. Do not drive if you feel very sleepy. Keep all follow-up visits. This is important. Contact a health care provider if: You are tired throughout the day. You have trouble in your daily routine due to sleepiness. You continue to have sleep problems, or your sleep problems get worse. Get help right away if: You have thoughts about hurting yourself or someone else. Get help right away if you feel like you may hurt yourself or others, or have thoughts about taking your own life. Go to your nearest emergency room or: Call 911. Call the National Suicide Prevention Lifeline at 1-800-273-8255 or 988. This is open 24 hours a day. Text the Crisis Text Line at 741741. Summary Insomnia is a sleep disorder that makes it difficult to fall asleep or stay asleep. Insomnia can be long-term (chronic) or short-term (acute). Treatment for insomnia depends on the cause. Treatment may focus on treating an underlying condition that is causing the insomnia. Keep a sleep diary to help you and your health care provider figure out what could be causing your insomnia. This information is not intended to replace advice given to you by your health care provider. Make sure you discuss any questions you have with your health care provider. Document Revised: 04/25/2021 Document Reviewed: 04/25/2021 Elsevier Patient Education  2024 [employer]  

## 2023-04-18 ENCOUNTER — Ambulatory Visit: Payer: Medicare Other | Admitting: Psychology

## 2023-04-18 ENCOUNTER — Encounter: Payer: Self-pay | Admitting: Family Medicine

## 2023-04-18 DIAGNOSIS — F418 Other specified anxiety disorders: Secondary | ICD-10-CM

## 2023-04-18 NOTE — Progress Notes (Signed)
[facility] Counselor/Therapist Progress Note  Patient ID: [name], MRN: [id number]   Date: 04/18/23  Time Spent: 9:03  am - 9:53 am : 50 Minutes  Treatment Type: Individual Therapy.  Reported Symptoms: depression and anxiety.   Mental Status Exam: Appearance:  Casual     Behavior: Appropriate  Motor: Normal  Speech/Language:  Clear and Coherent  Affect: Congruent  Mood: normal  Thought process: normal  Thought content:   WNL  Sensory/Perceptual disturbances:   WNL  Orientation: oriented to person, place, time/date, and situation  Attention: Good  Concentration: Good  Memory: WNL  Fund of knowledge:  Good  Insight:   Good  Judgment:  Good  Impulse Control: Good   Risk Assessment: Danger to Self:  No Self-injurious Behavior: No Danger to Others: No Duty to Warn:no Physical Aggression / Violence:No  Access to Firearms a concern: No  Gang Involvement:No   Subjective:   [name] participated from home, via video and consented to treatment. Therapist participated from home office. I discussed the limitations of evaluation and management by telemedicine and the availability of in person appointments. The patient expressed understanding and agreed to proceed. [name] reviewed the events of the past week.    We reviewed numerous treatment approaches including CBT, BA, Problem Solving, and Solution focused therapy. Psych-education regarding the [name]'s diagnosis of Depression with anxiety was provided during the session. We discussed [name]'s goals treatment goals which include set boundaries for self and others, manage distress, manage guilt, re-evaluated role of "fixer", communicate assertively, mindfulness, managing rumination,Improving sleep, improve coping skills, increase socializing, challenging negative self-talk, verbalizing thoughts and feelings. [name] provided verbal approval of the treatment plan.    Interventions: Psycho-education & Goal Setting.   Diagnosis:  Depression with anxiety  Psychiatric Treatment: Yes , via PCP. Please see chart.    Treatment Plan:  Client Abilities/Strengths [name] is self-aware and motivated for change.   Support System: Friends  Barista of Needs [name] would like to set boundaries for self and others, manage distress, manage guilt, re-evaluated role of "fixer", communicate assertively, mindfulness, managing rumination,  Improving sleep, improve coping skills, increase socializing, challenging negative self-talk, verbalizing thoughts and feelings.    Treatment Level Weekly  Symptoms  Anxiety: feeling anxious, difficulty managing worry, worrying about different things, trouble relaxing, restlessness, irritability, and feeling afraid that something awful might happen.   (Status: maintained)  Depression:  Loss of interest, feeling down, lethargy, feeling bad about self, trouble relaxing, psycho-motor retarding, middle insomnia, insomnia, poor appetite,     (Status: maintained)  Goals:   [name] experiences symptoms of depression and anxiety.   Treatment plan signed and available on s-drive:  No    Target Date: 04/17/2024 Frequency: Weekly  Progress: 0 Modality: individual    Therapist will provide referrals for additional resources as appropriate.  Therapist will provide psycho-education regarding [name]'s diagnosis and corresponding treatment approaches and interventions. Licensed Clinical Social Worker, [place], LCSW will support the patient's ability to achieve the goals identified. will employ CBT, BA, Problem-solving, Solution Focused, Mindfulness,  coping skills, & other evidenced-based practices will be used to promote progress towards healthy functioning to help manage decrease symptoms associated with her diagnosis.   Reduce overall level, frequency, and intensity of the  feelings of depression, anxiety and panic evidenced by decreased overall symptoms  from 6 to 7 days/week to 0 to 1 days/week per client report for at  least 3 consecutive months. Verbally express understanding of the relationship between feelings of depression, anxiety and their impact on thinking patterns and behaviors. Verbalize an understanding of the role that distorted thinking plays in creating fears, excessive worry, and ruminations.    ([name] participated in the creation of the treatment plan)    Holly Ovens, LCSW

## 2023-04-19 ENCOUNTER — Other Ambulatory Visit: Payer: Self-pay

## 2023-04-19 ENCOUNTER — Other Ambulatory Visit (HOSPITAL_BASED_OUTPATIENT_CLINIC_OR_DEPARTMENT_OTHER): Payer: Self-pay

## 2023-04-24 ENCOUNTER — Ambulatory Visit: Payer: Medicare Other | Admitting: Family Medicine

## 2023-04-24 NOTE — Telephone Encounter (Signed)
Called patient. Instructions given per provider's notes

## 2023-05-02 ENCOUNTER — Ambulatory Visit: Payer: Medicare Other | Admitting: Psychology

## 2023-05-02 ENCOUNTER — Ambulatory Visit: Payer: Medicare Other | Admitting: *Deleted

## 2023-05-02 DIAGNOSIS — Z0000 Encounter for general adult medical examination without abnormal findings: Secondary | ICD-10-CM

## 2023-05-02 NOTE — Progress Notes (Signed)
Subjective:   [name] is a 67 y.o. female who presents for Medicare Annual (Subsequent) preventive examination.  Visit Complete: Virtual I connected with  [name] on 05/02/23 by a audio enabled telemedicine application and verified that I am speaking with the correct person using two identifiers.  Patient Location: Home  Provider Location: Office/Clinic  I discussed the limitations of evaluation and management by telemedicine. The patient expressed understanding and agreed to proceed.  Vital Signs: Because this visit was a virtual/telehealth visit, some criteria may be missing or patient reported. Any vitals not documented were not able to be obtained and vitals that have been documented are patient reported.   Cardiac Risk Factors include: advanced age (>21men, >83 women);dyslipidemia;diabetes mellitus;hypertension;obesity (BMI >30kg/m2)     Objective:    Today's Vitals   05/02/23 0820  PainSc: 1    There is no height or weight on file to calculate BMI.     05/02/2023    8:27 AM 03/19/2023    5:59 PM 01/24/2023    8:08 AM 07/13/2022    8:43 PM 03/27/2022    9:34 AM 02/21/2022    1:52 PM 01/06/2022    8:22 PM  Advanced Directives  Does Patient Have a Medical Advance Directive? Yes No Yes No Yes Yes No  Type of Estate agent of [place];Living will  Healthcare Power of [place];Living will  Healthcare Power of [place];Living will Healthcare Power of [place];Living will   Does patient want to make changes to medical advance directive? No - Patient declined  No - Patient declined  No - Patient declined    Copy of Healthcare Power of Attorney in Chart? No - copy requested  No - copy requested  No - copy requested No - copy requested   Would patient like information on creating a medical advance directive?    No - Patient declined       Current Medications (verified) Outpatient Encounter Medications as of 05/02/2023  Medication Sig    acetaminophen (TYLENOL) 650 MG CR tablet Take 1,300 mg by mouth every 8 (eight) hours as needed for pain.   albuterol (VENTOLIN HFA) 108 (90 Base) MCG/ACT inhaler Inhale 2 puffs into the lungs every 6 (six) hours as needed for wheezing or shortness of breath.   ARIPiprazole (ABILIFY) 2 MG tablet Take 1 tablet (2 mg total) by mouth daily.   buPROPion (WELLBUTRIN SR) 150 MG 12 hr tablet Take 1 tablet (150 mg total) by mouth daily.   calcium carbonate (OSCAL) 1500 (600 Ca) MG TABS tablet Take 1,500 mg by mouth 2 (two) times daily with a meal.   cholecalciferol (VITAMIN D3) 25 MCG (1000 UNIT) tablet Take 1,000 Units by mouth daily.   colchicine 0.6 MG tablet Take 1 tablet (0.6 mg total) by mouth 2 (two) times daily as needed.   Cyanocobalamin (B-12) 1000 MCG CAPS Take 1 capsule by mouth daily at 6 (six) AM.   famotidine (PEPCID) 40 MG tablet Take 1 tablet (40 mg total) by mouth daily.   furosemide (LASIX) 40 MG tablet TAKE 1 TABLET BY MOUTH  TWICE DAILY (Patient taking differently: Take 40 mg by mouth daily.)   hydrochlorothiazide (MICROZIDE) 12.5 MG capsule Take 1 capsule (12.5 mg total) by mouth daily.   hydrOXYzine (ATARAX) 10 MG tablet Take 1-2 tablets (10-20 mg total) by mouth 3 (three) times daily as needed.   Iron, Ferrous Sulfate, 325 (65 Fe) MG TABS Take 325 mg by mouth every other day.  meloxicam (MOBIC) 7.5 MG tablet Take 1-2 tablets (7.5-15 mg total) by mouth daily as needed for pain.   metFORMIN (GLUCOPHAGE) 500 MG tablet TAKE 1 TABLET BY MOUTH TWICE  DAILY WITH A MEAL   Multiple Vitamins-Minerals (MULTIVITAMIN & MINERAL PO) Take 1 tablet by mouth daily.   OLANZapine (ZYPREXA) 2.5 MG tablet Take 1 tablet (2.5 mg total) by mouth at bedtime.   telmisartan (MICARDIS) 20 MG tablet Take 1 tablet (20 mg total) by mouth daily.   tirzepatide [facility]) 2.5 MG/0.5ML Pen Inject 2.5 mg into the skin once a week.   zolpidem (AMBIEN) 10 MG tablet Take 0.5-1 tablets (5-10 mg total) by mouth at  bedtime as needed for sleep.   No facility-administered encounter medications on file as of 05/02/2023.    Allergies (verified) Allopurinol, Mucinex [guaifenesin er], Lyrica [pregabalin], Ozempic (0.25 or 0.5 mg-dose) [semaglutide(0.25 or 0.5mg -dos)], Penicillins, Prozac [fluoxetine hcl], Amitriptyline, and Lexapro [escitalopram oxalate]   History: Past Medical History:  Diagnosis Date   Allergic rhinitis    Allergy    Anemia 07/17/2016   Anxiety    Asthma    seasonal   Blood transfusion without reported diagnosis    Breast cancer (HCC)    right   Bronchitis    Colon polyps    CPAP (continuous positive airway pressure) dependence    Depression    Diabetes mellitus    borderline but takes metformin   Edema    Family history of breast cancer in first degree relative    Fibromyalgia    GERD (gastroesophageal reflux disease)    Hyperglycemia 03/05/2017   Hyperlipidemia 08/21/2016   no meds   Hyperparathyroidism    Hypertension    controlled by medications   Joint pain    Neuromuscular disorder (HCC)    Fibromyalgia   Obesity    Osteoarthritis    RA   PONV (postoperative nausea and vomiting)    occasional   Pre-diabetes    Sleep apnea    wears cpap   Viral meningitis    Vitamin D deficiency    Past Surgical History:  Procedure Laterality Date   ABDOMINAL HYSTERECTOMY  1998   ADENOIDECTOMY     BACK SURGERY     09/21/21   BREAST EXCISIONAL BIOPSY Left 1993   benign   BREAST SURGERY     Tran flap due to breast cancer   CESAREAN SECTION  1988   COLONOSCOPY  08/14/2011   HAND SURGERY     dog bite, right hand   HAND SURGERY     trauma, left hand   KNEE ARTHROSCOPY     bilateral   left knee replacement  2010   LUMBAR WOUND DEBRIDEMENT N/A 10/07/2021   Procedure: LUMBAR WOUND DEBRIDEMENT/REVISION;  Surgeon: Coletta Memos, MD;  Location: [facility] OR;  Service: Neurosurgery;  Laterality: N/A;  3C/RM 21 to follow Dr Conchita Paris   MASTECTOMY  01/13/1994   Right breast    parathyroid resection     PARATHYROIDECTOMY     POLYPECTOMY     rectal abscess     SEPTOPLASTY  1980   SHOULDER ARTHROSCOPY Right 11/09/2015   Procedure: ARTHROSCOPY SHOULDER-acromioplasty, distal clavicle resection and debridement;  Surgeon: Marcene Corning, MD;  Location: [facility] OR;  Service: Orthopedics;  Laterality: Right;   shoulder arthroscopy     rotator cuff repair   TOE SURGERY Right    paronychia and adenoma removed   TONSILLECTOMY     TOTAL KNEE ARTHROPLASTY  06/18/2008   [place]  TUMOR REMOVAL  1982   , scalp   Family History  Problem Relation Age of Onset   Emphysema Father    Lung cancer Father        lung ca dx 66   Other Father        prostate issues   Diabetes Father    Breast cancer Maternal Aunt        dx late 61s   Colon cancer Maternal Aunt        dx 9s   Colon polyps Maternal Aunt    Prostate cancer Maternal Grandfather 63       d. 85y   Heart disease Paternal Grandfather    Heart attack Paternal Grandfather        d. 50y   Diabetes Paternal Grandfather    Asthma Daughter        "seasonal"   Arthritis Maternal Grandmother    Aneurysm Maternal Grandmother        d. brain aneurysm at 24   Arthritis Mother    Colon polyps Mother    Hypertension Mother    Sleep apnea Mother    Multiple sclerosis Sister    Breast cancer Sister 8       L IDC and DCIS; ER/PR+, Her2-   Fibrocystic breast disease Sister    Arthritis/Rheumatoid Sister    Breast cancer Sister    Cancer Maternal Uncle        d. mouth cancer at younger age; smoker   Other Paternal Uncle        muscle issues - couldn't walk or talk; d. 20y   Cancer Maternal Aunt        lymphoma, dx 37s   Ovarian cancer Maternal Aunt        dx 71s; d. late 45s   Lung cancer Maternal Uncle        d. 63y; former smoker   Throat cancer Cousin        maternal 1st cousin; smoker   Breast cancer Cousin 28       maternal 1st cousin   Other Paternal Uncle        prostate issues   Breast cancer Cousin         paternal 1st cousin dx late 78s   Throat cancer Cousin        maternal 1st cousin; used SL tobacco   Prostate cancer Cousin        maternal 1st cousin dx 33s   Esophageal cancer Neg Hx    Rectal cancer Neg Hx    Stomach cancer Neg Hx    Social History   Socioeconomic History   Marital status: Widowed    Spouse name: Not on file   Number of children: 1   Years of education: Not on file   Highest education level: 12th grade  Occupational History   Occupation: ACCOUNTING    Employer: [employer]  Tobacco Use   Smoking status: Never   Smokeless tobacco: Never  Vaping Use   Vaping status: Never Used  Substance and Sexual Activity   Alcohol use: Not Currently   Drug use: No   Sexual activity: Not Currently  Other Topics Concern   Not on file  Social History Narrative   Not on file   Social Determinants of Health   Financial Resource Strain: Low Risk  (05/02/2023)   Overall Financial Resource Strain (CARDIA)    Difficulty of Paying Living Expenses: Not hard at all  Food Insecurity: No Food Insecurity (05/02/2023)   Hunger Vital Sign    Worried About Running Out of Food in the Last Year: Never true    Ran Out of Food in the Last Year: Never true  Transportation Needs: No Transportation Needs (05/02/2023)   PRAPARE - Administrator, Civil Service (Medical): No    Lack of Transportation (Non-Medical): No  Physical Activity: Insufficiently Active (05/02/2023)   Exercise Vital Sign    Days of Exercise per Week: 4 days    Minutes of Exercise per Session: 10 min  Stress: Stress Concern Present (05/02/2023)   [employer] of Occupational Health - Occupational Stress Questionnaire    Feeling of Stress : To some extent  Social Connections: Moderately Integrated (05/02/2023)   Social Connection and Isolation Panel [NHANES]    Frequency of Communication with Friends and Family: More than three times a week    Frequency of Social Gatherings with Friends and  Family: More than three times a week    Attends Religious Services: More than 4 times per year    Active Member of [employer] or Organizations: Yes    Attends Banker Meetings: More than 4 times per year    Marital Status: Widowed    Tobacco Counseling Counseling given: Not Answered   Clinical Intake:  Pre-visit preparation completed: Yes  Pain : 0-10 Pain Score: 1  Pain Type: Chronic pain (arthritis) Pain Location: Knee Pain Orientation: Right Pain Descriptors / Indicators: Aching Pain Onset: More than a month ago Pain Frequency: Intermittent     Nutritional Risks: None Diabetes: Yes CBG done?: No Did pt. bring in CBG monitor from home?: No  How often do you need to have someone help you when you read instructions, pamphlets, or other written materials from your doctor or pharmacy?: 1 - Never  Interpreter Needed?: No  Information entered by :: Donne Anon, CMA   Activities of Daily Living    05/02/2023    8:23 AM 05/02/2023    8:19 AM  In your present state of health, do you have any difficulty performing the following activities:  Hearing? 0 0  Vision? 0 0  Difficulty concentrating or making decisions? 1 0  Comment concentration   Walking or climbing stairs? 1 0  Dressing or bathing? 0 0  Doing errands, shopping? 0 0  Preparing Food and eating ? N N  Using the Toilet? N N  In the past six months, have you accidently leaked urine? N N  Do you have problems with loss of bowel control? N N  Managing your Medications? N N  Managing your Finances? N N  Housekeeping or managing your Housekeeping? N N    Patient Care Team: Bradd Canary, MD as PCP - General (Family Medicine) Zelphia Cairo, MD as Consulting Physician (Obstetrics and Gynecology)  Indicate any recent Medical Services you may have received from other than [facility] providers in the past year (date may be approximate).     Assessment:   This is a routine wellness examination for  Zamia.  Hearing/Vision screen No results found.   Goals Addressed   None    Depression Screen    05/02/2023    8:28 AM 02/26/2023   11:21 AM 01/24/2023    9:05 AM 11/13/2022    8:58 AM 08/17/2022    8:21 AM 07/31/2022    3:26 PM 04/03/2022   10:46 AM  PHQ 2/9 Scores  PHQ - 2 Score 1 2 0  0 0 0 0  PHQ- 9 Score  6  0 0 0 0    Fall Risk    05/02/2023    8:27 AM 05/02/2023    8:19 AM 02/26/2023   11:21 AM 01/24/2023    9:05 AM 11/13/2022    9:00 AM  Fall Risk   Falls in the past year? 0 0 0 0 0  Number falls in past yr: 0  0 0 0  Injury with Fall? 0  0 0 0  Risk for fall due to : No Fall Risks      Follow up Falls evaluation completed  Falls evaluation completed Falls evaluation completed     MEDICARE RISK AT HOME: Medicare Risk at Home Any stairs in or around the home?: No If so, are there any without handrails?: No Home free of loose throw rugs in walkways, pet beds, electrical cords, etc?: Yes Adequate lighting in your home to reduce risk of falls?: Yes Life alert?: No Use of a cane, walker or w/c?: No Grab bars in the bathroom?: Yes Shower chair or bench in shower?: No Elevated toilet seat or a handicapped toilet?: No  TIMED UP AND GO:  Was the test performed?  No    Cognitive Function:        05/02/2023    8:35 AM 02/21/2022    1:54 PM  6CIT Screen  What Year? 0 points 0 points  What month? 0 points 0 points  What time? 0 points 0 points  Count back from 20 0 points 0 points  Months in reverse 0 points 0 points  Repeat phrase 0 points 0 points  Total Score 0 points 0 points    Immunizations Immunization History  Administered Date(s) Administered   Influenza Inj Mdck Quad Pf 03/19/2018   Influenza Split 03/27/2011, 03/29/2012   Influenza Whole 04/07/2009, 03/28/2010   Influenza,inj,Quad PF,6+ Mos 02/26/2013, 01/28/2016, 03/05/2017   Influenza-Unspecified 02/10/2019, 02/10/2020   PFIZER(Purple Top)SARS-COV-2 Vaccination 08/11/2019   Pneumococcal  Polysaccharide-23 04/03/2014, 04/19/2020   Td 04/29/2009   Tdap 04/19/2020   Zoster Recombinant(Shingrix) 08/08/2017, 10/08/2017   Zoster, Live 01/28/2016    TDAP status: Up to date  Flu Vaccine status: Declined, Education has been provided regarding the importance of this vaccine but patient still declined. Advised may receive this vaccine at local pharmacy or Health Dept. Aware to provide a copy of the vaccination record if obtained from local pharmacy or Health Dept. Verbalized acceptance and understanding.  Pneumococcal vaccine status: Due, Education has been provided regarding the importance of this vaccine. Advised may receive this vaccine at local pharmacy or Health Dept. Aware to provide a copy of the vaccination record if obtained from local pharmacy or Health Dept. Verbalized acceptance and understanding.  Covid-19 vaccine status: Declined, Education has been provided regarding the importance of this vaccine but patient still declined. Advised may receive this vaccine at local pharmacy or Health Dept.or vaccine clinic. Aware to provide a copy of the vaccination record if obtained from local pharmacy or Health Dept. Verbalized acceptance and understanding.  Qualifies for Shingles Vaccine? Yes   Zostavax completed Yes   Shingrix Completed?: Yes  Screening Tests Health Maintenance  Topic Date Due   Pneumonia Vaccine 38+ Years old (2 of 2 - PCV) 04/19/2021   Colonoscopy  09/06/2021   FOOT EXAM  10/28/2021   Medicare Annual Wellness (AWV)  02/22/2023   INFLUENZA VACCINE  08/27/2023 (Originally 12/28/2022)   OPHTHALMOLOGY EXAM  08/19/2023   HEMOGLOBIN A1C  08/26/2023   MAMMOGRAM  11/08/2023   Diabetic kidney evaluation - Urine ACR  02/26/2024   Diabetic kidney evaluation - eGFR measurement  03/18/2024   DTaP/Tdap/Td (3 - Td or Tdap) 04/19/2030   DEXA SCAN  Completed   Hepatitis C Screening  Completed   Zoster Vaccines- Shingrix  Completed   HPV VACCINES  Aged Out   COVID-19  Vaccine  Discontinued    Health Maintenance  Health Maintenance Due  Topic Date Due   Pneumonia Vaccine 59+ Years old (2 of 2 - PCV) 04/19/2021   Colonoscopy  09/06/2021   FOOT EXAM  10/28/2021   Medicare Annual Wellness (AWV)  02/22/2023    Colorectal cancer screening: Type of screening: Colonoscopy. Completed 09/06/16. Repeat every 5 years pt will call Dr. Leone Payor to get scheduled  Mammogram status: Completed 11/08/22. Repeat every year  Bone Density status: Completed 09/02/20. Results reflect: Bone density results: NORMAL. Repeat every 2 years.  Lung Cancer Screening: (Low Dose CT Chest recommended if Age 72-80 years, 20 pack-year currently smoking OR have quit w/in 15years.) does not qualify.   Additional Screening:  Hepatitis C Screening: does qualify; Completed 10/28/20  Vision Screening: Recommended annual ophthalmology exams for early detection of glaucoma and other disorders of the eye. Is the patient up to date with their annual eye exam?  Yes  Who is the provider or what is the name of the office in which the patient attends annual eye exams? [facility] If pt is not established with a provider, would they like to be referred to a provider to establish care? No .   Dental Screening: Recommended annual dental exams for proper oral hygiene  Diabetic Foot Exam: Diabetic Foot Exam: Overdue, Pt has been advised about the importance in completing this exam. Pt is scheduled for diabetic foot exam on N/a.  Community Resource Referral / Chronic Care Management: CRR required this visit?  No   CCM required this visit?  No     Plan:     I have personally reviewed and noted the following in the patient's chart:   Medical and social history Use of alcohol, tobacco or illicit drugs  Current medications and supplements including opioid prescriptions. Patient is not currently taking opioid prescriptions. Functional ability and status Nutritional status Physical  activity Advanced directives List of other physicians Hospitalizations, surgeries, and ER visits in previous 12 months Vitals Screenings to include cognitive, depression, and falls Referrals and appointments  In addition, I have reviewed and discussed with patient certain preventive protocols, quality metrics, and best practice recommendations. A written personalized care plan for preventive services as well as general preventive health recommendations were provided to patient.     Donne Anon, CMA   05/02/2023   After Visit Summary: (MyChart) Due to this being a telephonic visit, the after visit summary with patients personalized plan was offered to patient via MyChart   Nurse Notes: None

## 2023-05-02 NOTE — Patient Instructions (Signed)
[name] , Thank you for taking time to come for your Medicare Wellness Visit. I appreciate your ongoing commitment to your health goals. Please review the following plan we discussed and let me know if I can assist you in the future.     This is a list of the screening recommended for you and due dates:  Health Maintenance  Topic Date Due   Pneumonia Vaccine (2 of 2 - PCV) 04/19/2021   Colon Cancer Screening  09/06/2021   Complete foot exam   10/28/2021   Flu Shot  08/27/2023*   Eye exam for diabetics  08/19/2023   Hemoglobin A1C  08/26/2023   Mammogram  11/08/2023   Yearly kidney health urinalysis for diabetes  02/26/2024   Yearly kidney function blood test for diabetes  03/18/2024   Medicare Annual Wellness Visit  05/01/2024   DTaP/Tdap/Td vaccine (3 - Td or Tdap) 04/19/2030   DEXA scan (bone density measurement)  Completed   Hepatitis C Screening  Completed   Zoster (Shingles) Vaccine  Completed   HPV Vaccine  Aged Out   COVID-19 Vaccine  Discontinued  *Topic was postponed. The date shown is not the original due date.    Next appointment: Follow up in one year for your annual wellness visit.   Preventive Care 33 Years and Older, Female Preventive care refers to lifestyle choices and visits with your health care provider that can promote health and wellness. What does preventive care include? A yearly physical exam. This is also called an annual well check. Dental exams once or twice a year. Routine eye exams. Ask your health care provider how often you should have your eyes checked. Personal lifestyle choices, including: Daily care of your teeth and gums. Regular physical activity. Eating a healthy diet. Avoiding tobacco and drug use. Limiting alcohol use. Practicing safe sex. Taking low-dose aspirin every day. Taking vitamin and mineral supplements as recommended by your health care provider. What happens during an annual well check? The services and screenings  done by your health care provider during your annual well check will depend on your age, overall health, lifestyle risk factors, and family history of disease. Counseling  Your health care provider may ask you questions about your: Alcohol use. Tobacco use. Drug use. Emotional well-being. Home and relationship well-being. Sexual activity. Eating habits. History of falls. Memory and ability to understand (cognition). Work and work Astronomer. Reproductive health. Screening  You may have the following tests or measurements: Height, weight, and BMI. Blood pressure. Lipid and cholesterol levels. These may be checked every 5 years, or more frequently if you are over 81 years old. Skin check. Lung cancer screening. You may have this screening every year starting at age 57 if you have a 30-pack-year history of smoking and currently smoke or have quit within the past 15 years. Fecal occult blood test (FOBT) of the stool. You may have this test every year starting at age 60. Flexible sigmoidoscopy or colonoscopy. You may have a sigmoidoscopy every 5 years or a colonoscopy every 10 years starting at age 61. Hepatitis C blood test. Hepatitis B blood test. Sexually transmitted disease (STD) testing. Diabetes screening. This is done by checking your blood sugar (glucose) after you have not eaten for a while (fasting). You may have this done every 1-3 years. Bone density scan. This is done to screen for osteoporosis. You may have this done starting at age 57. Mammogram. This may be done every 1-2 years. Talk to your health  care provider about how often you should have regular mammograms. Talk with your health care provider about your test results, treatment options, and if necessary, the need for more tests. Vaccines  Your health care provider may recommend certain vaccines, such as: Influenza vaccine. This is recommended every year. Tetanus, diphtheria, and acellular pertussis (Tdap, Td)  vaccine. You may need a Td booster every 10 years. Zoster vaccine. You may need this after age 72. Pneumococcal 13-valent conjugate (PCV13) vaccine. One dose is recommended after age 89. Pneumococcal polysaccharide (PPSV23) vaccine. One dose is recommended after age 31. Talk to your health care provider about which screenings and vaccines you need and how often you need them. This information is not intended to replace advice given to you by your health care provider. Make sure you discuss any questions you have with your health care provider. Document Released: 06/11/2015 Document Revised: 02/02/2016 Document Reviewed: 03/16/2015 Elsevier Interactive Patient Education  2017 [employer].  Fall Prevention in the Home Falls can cause injuries. They can happen to people of all ages. There are many things you can do to make your home safe and to help prevent falls. What can I do on the outside of my home? Regularly fix the edges of walkways and driveways and fix any cracks. Remove anything that might make you trip as you walk through a door, such as a raised step or threshold. Trim any bushes or trees on the path to your home. Use bright outdoor lighting. Clear any walking paths of anything that might make someone trip, such as rocks or tools. Regularly check to see if handrails are loose or broken. Make sure that both sides of any steps have handrails. Any raised decks and porches should have guardrails on the edges. Have any leaves, snow, or ice cleared regularly. Use sand or salt on walking paths during winter. Clean up any spills in your garage right away. This includes oil or grease spills. What can I do in the bathroom? Use night lights. Install grab bars by the toilet and in the tub and shower. Do not use towel bars as grab bars. Use non-skid mats or decals in the tub or shower. If you need to sit down in the shower, use a plastic, non-slip stool. Keep the floor dry. Clean up any  water that spills on the floor as soon as it happens. Remove soap buildup in the tub or shower regularly. Attach bath mats securely with double-sided non-slip rug tape. Do not have throw rugs and other things on the floor that can make you trip. What can I do in the bedroom? Use night lights. Make sure that you have a light by your bed that is easy to reach. Do not use any sheets or blankets that are too big for your bed. They should not hang down onto the floor. Have a firm chair that has side arms. You can use this for support while you get dressed. Do not have throw rugs and other things on the floor that can make you trip. What can I do in the kitchen? Clean up any spills right away. Avoid walking on wet floors. Keep items that you use a lot in easy-to-reach places. If you need to reach something above you, use a strong step stool that has a grab bar. Keep electrical cords out of the way. Do not use floor polish or wax that makes floors slippery. If you must use wax, use non-skid floor wax. Do not have throw  rugs and other things on the floor that can make you trip. What can I do with my stairs? Do not leave any items on the stairs. Make sure that there are handrails on both sides of the stairs and use them. Fix handrails that are broken or loose. Make sure that handrails are as long as the stairways. Check any carpeting to make sure that it is firmly attached to the stairs. Fix any carpet that is loose or worn. Avoid having throw rugs at the top or bottom of the stairs. If you do have throw rugs, attach them to the floor with carpet tape. Make sure that you have a light switch at the top of the stairs and the bottom of the stairs. If you do not have them, ask someone to add them for you. What else can I do to help prevent falls? Wear shoes that: Do not have high heels. Have rubber bottoms. Are comfortable and fit you well. Are closed at the toe. Do not wear sandals. If you use a  stepladder: Make sure that it is fully opened. Do not climb a closed stepladder. Make sure that both sides of the stepladder are locked into place. Ask someone to hold it for you, if possible. Clearly mark and make sure that you can see: Any grab bars or handrails. First and last steps. Where the edge of each step is. Use tools that help you move around (mobility aids) if they are needed. These include: Canes. Walkers. Scooters. Crutches. Turn on the lights when you go into a dark area. Replace any light bulbs as soon as they burn out. Set up your furniture so you have a clear path. Avoid moving your furniture around. If any of your floors are uneven, fix them. If there are any pets around you, be aware of where they are. Review your medicines with your doctor. Some medicines can make you feel dizzy. This can increase your chance of falling. Ask your doctor what other things that you can do to help prevent falls. This information is not intended to replace advice given to you by your health care provider. Make sure you discuss any questions you have with your health care provider. Document Released: 03/11/2009 Document Revised: 10/21/2015 Document Reviewed: 06/19/2014 Elsevier Interactive Patient Education  2017 [employer].

## 2023-05-04 ENCOUNTER — Other Ambulatory Visit (HOSPITAL_BASED_OUTPATIENT_CLINIC_OR_DEPARTMENT_OTHER): Payer: Self-pay

## 2023-05-08 ENCOUNTER — Other Ambulatory Visit (HOSPITAL_BASED_OUTPATIENT_CLINIC_OR_DEPARTMENT_OTHER): Payer: Self-pay

## 2023-05-09 ENCOUNTER — Encounter: Payer: Self-pay | Admitting: Family Medicine

## 2023-05-09 ENCOUNTER — Other Ambulatory Visit: Payer: Self-pay | Admitting: Emergency Medicine

## 2023-05-09 ENCOUNTER — Other Ambulatory Visit: Payer: Self-pay | Admitting: Family Medicine

## 2023-05-09 MED ORDER — BUPROPION HCL ER (SR) 150 MG PO TB12: 150.0000 mg | ORAL_TABLET | Freq: Every day | ORAL | 2 refills | Status: DC

## 2023-05-21 ENCOUNTER — Encounter: Payer: Self-pay | Admitting: Family Medicine

## 2023-05-22 NOTE — Telephone Encounter (Signed)
Spoke with patient. She is going to try to bring a urine sample in today. If she can't make it she will bring it in Thursday

## 2023-05-24 ENCOUNTER — Encounter: Payer: Self-pay | Admitting: Pharmacist

## 2023-05-24 ENCOUNTER — Encounter: Payer: Medicare Other | Admitting: Family Medicine

## 2023-05-24 NOTE — Progress Notes (Signed)
Pharmacy Quality Measure Review  This patient is appearing on report for being at risk of failing the measure for Statin Use in Persons with Diabetes (SUPD) medications this calendar year.   Prior trials of: atorvastatin - stopped due to patient preference.   Last lipid patient shows LDL < 100 but elevated triglycerides. Due to CVD risk in diabetic patients statin therapy is recommended.  Patient is noted to have history of fibromyalgia.   Lab Results  Component Value Date   CHOL 184 02/26/2023   HDL 54.40 02/26/2023   LDLCALC 94 02/26/2023   LDLDIRECT 110.0 08/21/2016   TRIG 178.0 (H) 02/26/2023   CHOLHDL 3 02/26/2023   Will forwarded message to PCP whom she will see 05/29/2023 for f/u UTI.  Consider starting statin - could try rosuvastatin 5mg  3 times per week.   Henrene Pastor, PharmD Clinical Pharmacist [facility]  Population Health [phone]

## 2023-05-29 ENCOUNTER — Ambulatory Visit: Payer: Medicare Other | Admitting: Family Medicine

## 2023-06-06 ENCOUNTER — Ambulatory Visit: Payer: Medicare Other | Admitting: Psychology

## 2023-06-12 ENCOUNTER — Encounter (HOSPITAL_BASED_OUTPATIENT_CLINIC_OR_DEPARTMENT_OTHER): Payer: Self-pay | Admitting: Pulmonary Disease

## 2023-06-12 ENCOUNTER — Ambulatory Visit (HOSPITAL_BASED_OUTPATIENT_CLINIC_OR_DEPARTMENT_OTHER): Payer: Medicare Other | Admitting: Pulmonary Disease

## 2023-06-12 VITALS — BP 112/66 | HR 107 | Resp 18 | Ht 66.0 in | Wt 293.8 lb

## 2023-06-12 DIAGNOSIS — G4733 Obstructive sleep apnea (adult) (pediatric): Secondary | ICD-10-CM | POA: Diagnosis not present

## 2023-06-12 DIAGNOSIS — R053 Chronic cough: Secondary | ICD-10-CM

## 2023-06-12 NOTE — Patient Instructions (Signed)
 X CPAP report - take your card in to [facility]  Trial of chlortrimeton

## 2023-06-12 NOTE — Progress Notes (Signed)
   Subjective:    Patient ID: [name], female    DOB: Oct 20, 1955, 68 y.o.   MRN: [id number]  HPI  68  yo for FU of OSA    Chronic cough , CXR appeared prominent interstitium,HRCT was nml   PMH -hypertension, diabetes, asthma but she reports using albuterol  MDI only for episodes of bronchitis Covid infection 02/2020  Discussed the use of AI scribe software for clinical note transcription with the patient, who gave verbal consent to proceed.  History of Present Illness   The patient, with a history of sleep apnea managed with CPAP, presents with a persistent cough and phlegm production. She describes the phlegm as a 'wad' that she coughs up and spits out several times a day, with the most severe symptoms occurring in the morning. This has been a new development since her recovery from COVID-19. She has tried Zyrtec  and Mucinex  without relief. She denies any history of smoking.  The patient also reports severe stomach pain with the use of ozempic  and Mounjaro , which she has since discontinued. She describes the pain as feeling like 'thunder and lightning' in her stomach.  Her sleep apnea is well-managed with CPAP, and she reports being unable to sleep without it. She has not received her flu shot or RSV vaccine, expressing concern about potential side effects.        Significant tests/ events reviewed 12/2019 HST HST AHI 15/hour, worse in supine position 11/2019 HRCT >> no ILD 04/2012 NPSG >> mod OSA, 15/h with nadir desatn 71% corre ected by CPAP 8cm, small full face mask  Review of Systems neg for any significant sore throat, dysphagia, itching, sneezing, nasal congestion or excess/ purulent secretions, fever, chills, sweats, unintended wt loss, pleuritic or exertional cp, hempoptysis, orthopnea pnd or change in chronic leg swelling. Also denies presyncope, palpitations, heartburn, abdominal pain, nausea, vomiting, diarrhea or change in bowel or urinary habits,  dysuria,hematuria, rash, arthralgias, visual complaints, headache, numbness weakness or ataxia.     Objective:   Physical Exam  Gen. Pleasant, obese, in no distress ENT - no lesions, no post nasal drip Neck: No JVD, no thyromegaly, no carotid bruits Lungs: no use of accessory muscles, no dullness to percussion, decreased without rales or rhonchi  Cardiovascular: Rhythm regular, heart sounds  normal, no murmurs or gallops, no peripheral edema Musculoskeletal: No deformities, no cyanosis or clubbing , no tremors       Assessment & Plan:       Chronic Cough with Sputum Production Persistent cough with phlegm production post-COVID-19 infection. Previous treatments with Zyrtec  and Mucinex  were ineffective. Differential diagnosis includes post-viral cough, chronic bronchitis, or allergic rhinitis. Discussed trying chlortrimeton as an alternative to Zyrtec . - Try chlortrimeton as an alternative to Zyrtec   OSA on CPAP Chronic condition managed with CPAP. Reports good compliance and cannot sleep without the CPAP machine. No new issues reported. - Review CPAP report when available - Renew CPAP supplies   General Health Maintenance Hesitant about vaccines but acknowledges the importance of flu and RSV vaccinations, especially given the current high prevalence. Discussed that the flu shot contains dead flu viruses, which may cause mild body aches as the body mounts an immune response. RSV vaccine is well tolerated and 90% effective, with protection lasting at least two years. - Administer flu shot in the office - Recommend RSV vaccine from pharmacy.

## 2023-06-19 ENCOUNTER — Ambulatory Visit (HOSPITAL_BASED_OUTPATIENT_CLINIC_OR_DEPARTMENT_OTHER): Payer: Medicare Other | Admitting: Cardiology

## 2023-06-26 ENCOUNTER — Telehealth: Payer: Medicare Other | Admitting: Family

## 2023-06-26 DIAGNOSIS — J019 Acute sinusitis, unspecified: Secondary | ICD-10-CM | POA: Diagnosis not present

## 2023-06-26 MED ORDER — DOXYCYCLINE HYCLATE 100 MG PO TABS: 100.0000 mg | ORAL_TABLET | Freq: Two times a day (BID) | ORAL | 0 refills | Status: DC

## 2023-06-26 NOTE — Patient Instructions (Signed)
VISIT SUMMARY:  You came in today with symptoms of a sinus infection that have been ongoing for about a week. You have been experiencing pain above your eyes and noticed blood in your nasal discharge. Despite trying various self-care methods, your symptoms have not improved.  YOUR PLAN:  -SINUSITIS: Sinusitis is an inflammation or swelling of the tissue lining the sinuses, often caused by an infection. You have been prescribed Doxycycline for 7 days to help clear the infection. Please continue using the neti pot, Flonase as supportive care. If your symptoms do not improve by the end of the 7-day course, please contact us for a possible extension to a full 10-day course.  INSTRUCTIONS:  Please take Doxycycline as prescribed for 7 days. Continue using the neti pot, Flonase, and sinus pills. If your symptoms persist towards the end of the 7-day course, notify us for a possible extension to a full 10-day course.

## 2023-06-26 NOTE — Progress Notes (Addendum)
Subjective:     Patient ID: [name], female    DOB: [birth year], 68 y.o.   MRN: [id number]  Chief Complaint  Patient presents with   Sinus Problem    Complains of sinus congestion and pain, this started about 10 days ago.    Cough    Complains of productive cough    Sore Throat    Complains of sore throat     Sinus Problem Associated symptoms include coughing.  Cough  Sore Throat  Associated symptoms include coughing.      MyChart Video Visit       Virtual Visit via Video Note      Patient location: Home. Patient and provider in visit Provider location: Office   I discussed the limitations of evaluation and management by telemedicine and the availability of in person appointments. The patient expressed understanding and agreed to proceed.   Visit Date: 06/29/2023   Today's healthcare provider: Lemont Fillers, NP  Discussed the use of AI scribe software for clinical note transcription with the patient, who gave verbal consent to proceed.  History of Present Illness   The patient presents with symptoms of a sinus infection.  She has been experiencing symptoms for about 10 days, including pain above the eyes that makes it painful to keep [name] eyes open. There is a change in the color of [name] nasal discharge, which now contains blood.  She has attempted self-treatment using a neti pot, Flonase, sinus pills, and hot showers, but these measures have not been effective.  She has a known allergy to penicillin, which causes hives, and she has been allergic since childhood. She has not taken antibiotics recently and is unsure of which ones she has tolerated in the past.          Health Maintenance Due  Topic Date Due   Pneumonia Vaccine 29+ Years old (2 of 2 - PCV) 04/19/2021   Colonoscopy  09/06/2021   FOOT EXAM  10/28/2021    Past Medical History:  Diagnosis Date   Allergic rhinitis    Allergy    Anemia 07/17/2016   Anxiety    Asthma     seasonal   Blood transfusion without reported diagnosis    Breast cancer (HCC)    right   Bronchitis    Colon polyps    CPAP (continuous positive airway pressure) dependence    Depression    Diabetes mellitus    borderline but takes metformin   Edema    Family history of breast cancer in first degree relative    Fibromyalgia    GERD (gastroesophageal reflux disease)    Hyperglycemia 03/05/2017   Hyperlipidemia 08/21/2016   no meds   Hyperparathyroidism    Hypertension    controlled by medications   Joint pain    Neuromuscular disorder (HCC)    Fibromyalgia   Obesity    Osteoarthritis    RA   PONV (postoperative nausea and vomiting)    occasional   Pre-diabetes    Sleep apnea    wears cpap   Viral meningitis    Vitamin D deficiency     Past Surgical History:  Procedure Laterality Date   ABDOMINAL HYSTERECTOMY  1998   ADENOIDECTOMY     BACK SURGERY     09/21/21   BREAST EXCISIONAL BIOPSY Left 1993   benign   BREAST SURGERY     Tran flap due to breast cancer   CESAREAN SECTION  1988  COLONOSCOPY  08/14/2011   HAND SURGERY     dog bite, right hand   HAND SURGERY     trauma, left hand   KNEE ARTHROSCOPY     bilateral   left knee replacement  2010   LUMBAR WOUND DEBRIDEMENT N/A 10/07/2021   Procedure: LUMBAR WOUND DEBRIDEMENT/REVISION;  Surgeon: Coletta Memos, MD;  Location: [facility] OR;  Service: Neurosurgery;  Laterality: N/A;  3C/RM 21 to follow Dr Conchita Paris   MASTECTOMY  01/13/1994   Right breast   parathyroid resection     PARATHYROIDECTOMY     POLYPECTOMY     rectal abscess     SEPTOPLASTY  1980   SHOULDER ARTHROSCOPY Right 11/09/2015   Procedure: ARTHROSCOPY SHOULDER-acromioplasty, distal clavicle resection and debridement;  Surgeon: Marcene Corning, MD;  Location: [facility] OR;  Service: Orthopedics;  Laterality: Right;   shoulder arthroscopy     rotator cuff repair   TOE SURGERY Right    paronychia and adenoma removed   TONSILLECTOMY     TOTAL KNEE ARTHROPLASTY   06/18/2008   [facility]   TUMOR REMOVAL  1982   , scalp    Family History  Problem Relation Age of Onset   Emphysema Father    Lung cancer Father        lung ca dx 58   Other Father        prostate issues   Diabetes Father    Breast cancer Maternal Aunt        dx late 54s   Colon cancer Maternal Aunt        dx 24s   Colon polyps Maternal Aunt    Prostate cancer Maternal Grandfather 61       d. 85y   Heart disease Paternal Grandfather    Heart attack Paternal Grandfather        d. 50y   Diabetes Paternal Grandfather    Asthma Daughter        "seasonal"   Arthritis Maternal Grandmother    Aneurysm Maternal Grandmother        d. brain aneurysm at 80   Arthritis Mother    Colon polyps Mother    Hypertension Mother    Sleep apnea Mother    Multiple sclerosis Sister    Breast cancer Sister 32       L IDC and DCIS; ER/PR+, Her2-   Fibrocystic breast disease Sister    Arthritis/Rheumatoid Sister    Breast cancer Sister    Cancer Maternal Uncle        d. mouth cancer at younger age; smoker   Other Paternal Uncle        muscle issues - couldn't walk or talk; d. 20y   Cancer Maternal Aunt        lymphoma, dx 31s   Ovarian cancer Maternal Aunt        dx 56s; d. late 62s   Lung cancer Maternal Uncle        d. 62y; former smoker   Throat cancer Cousin        maternal 1st cousin; smoker   Breast cancer Cousin 72       maternal 1st cousin   Other Paternal Uncle        prostate issues   Breast cancer Cousin        paternal 1st cousin dx late 77s   Throat cancer Cousin        maternal 1st cousin; used SL tobacco   Prostate cancer Cousin  maternal 1st cousin dx 35s   Esophageal cancer Neg Hx    Rectal cancer Neg Hx    Stomach cancer Neg Hx     Social History   Socioeconomic History   Marital status: Widowed    Spouse name: Not on file   Number of children: 1   Years of education: Not on file   Highest education level: 12th grade  Occupational History    Occupation: ACCOUNTING    Employer: [employer]  Tobacco Use   Smoking status: Never   Smokeless tobacco: Never  Vaping Use   Vaping status: Never Used  Substance and Sexual Activity   Alcohol use: Not Currently   Drug use: No   Sexual activity: Not Currently  Other Topics Concern   Not on file  Social History Narrative   Not on file   Social Drivers of Health   Financial Resource Strain: Low Risk  (05/02/2023)   Overall Financial Resource Strain (CARDIA)    Difficulty of Paying Living Expenses: Not hard at all  Food Insecurity: No Food Insecurity (05/02/2023)   Hunger Vital Sign    Worried About Running Out of Food in the Last Year: Never true    Ran Out of Food in the Last Year: Never true  Transportation Needs: No Transportation Needs (05/02/2023)   PRAPARE - Administrator, Civil Service (Medical): No    Lack of Transportation (Non-Medical): No  Physical Activity: Insufficiently Active (05/02/2023)   Exercise Vital Sign    Days of Exercise per Week: 4 days    Minutes of Exercise per Session: 10 min  Stress: Stress Concern Present (05/02/2023)   [employer] of Occupational Health - Occupational Stress Questionnaire    Feeling of Stress : To some extent  Social Connections: Moderately Integrated (05/02/2023)   Social Connection and Isolation Panel [NHANES]    Frequency of Communication with Friends and Family: More than three times a week    Frequency of Social Gatherings with Friends and Family: More than three times a week    Attends Religious Services: More than 4 times per year    Active Member of [employer] or Organizations: Yes    Attends Banker Meetings: More than 4 times per year    Marital Status: Widowed  Intimate Partner Violence: Not At Risk (12/20/2022)   Humiliation, Afraid, Rape, and Kick questionnaire    Fear of Current or Ex-Partner: No    Emotionally Abused: No    Physically Abused: No    Sexually Abused: No    Outpatient  Medications Prior to Visit  Medication Sig Dispense Refill   acetaminophen (TYLENOL) 650 MG CR tablet Take 1,300 mg by mouth every 8 (eight) hours as needed for pain.     albuterol (VENTOLIN HFA) 108 (90 Base) MCG/ACT inhaler Inhale 2 puffs into the lungs every 6 (six) hours as needed for wheezing or shortness of breath. 8 g 0   calcium carbonate (OSCAL) 1500 (600 Ca) MG TABS tablet Take 1,500 mg by mouth 2 (two) times daily with a meal.     cholecalciferol (VITAMIN D3) 25 MCG (1000 UNIT) tablet Take 1,000 Units by mouth daily.     Cyanocobalamin (B-12) 1000 MCG CAPS Take 1 capsule by mouth daily at 6 (six) AM.     famotidine (PEPCID) 40 MG tablet Take 1 tablet (40 mg total) by mouth daily. 30 tablet 5   furosemide (LASIX) 40 MG tablet TAKE 1 TABLET BY MOUTH  TWICE DAILY (Patient taking differently: Take 40 mg by mouth daily.) 180 tablet 3   hydrochlorothiazide (MICROZIDE) 12.5 MG capsule Take 1 capsule (12.5 mg total) by mouth daily. 90 capsule 3   Iron, Ferrous Sulfate, 325 (65 Fe) MG TABS Take 325 mg by mouth every other day. 30 tablet    meloxicam (MOBIC) 7.5 MG tablet Take 1-2 tablets (7.5-15 mg total) by mouth daily as needed for pain. 60 tablet 1   metFORMIN (GLUCOPHAGE) 500 MG tablet TAKE 1 TABLET BY MOUTH TWICE  DAILY WITH A MEAL 200 tablet 2   Multiple Vitamins-Minerals (MULTIVITAMIN & MINERAL PO) Take 1 tablet by mouth daily.     telmisartan (MICARDIS) 20 MG tablet TAKE 1 TABLET BY MOUTH DAILY 100 tablet 0   zolpidem (AMBIEN) 10 MG tablet Take 0.5-1 tablets (5-10 mg total) by mouth at bedtime as needed for sleep. 30 tablet 2   No facility-administered medications prior to visit.    Allergies  Allergen Reactions   Allopurinol Hives   Mucinex [Guaifenesin Er] Shortness Of Breath   Lyrica [Pregabalin] Swelling   Ozempic (0.25 Or 0.5 Mg-Dose) [Semaglutide(0.25 Or 0.5mg -Dos)] Other (See Comments)    Abdominal pain   Penicillins Hives and Other (See Comments)    Has patient had a PCN  reaction causing immediate rash, facial/tongue/throat swelling, SOB or lightheadedness with hypotension: no Has patient had a PCN reaction causing severe rash involving mucus membranes or skin necrosis: no Has patient had a PCN reaction that required hospitalization no Has patient had a PCN reaction occurring within the last 10 years: no If all of the above answers are "NO", then may proceed with Cephalosporin use.    Prozac [Fluoxetine Hcl] Hives   Amitriptyline Other (See Comments)    "felt weird, fatigue, dizziness"   Lexapro [Escitalopram Oxalate]     Nausea and hypersalivation.     Review of Systems  Respiratory:  Positive for cough.    See HPI    Objective:     Gen: Awake, alert, no acute distress Resp: Breathing is even and non-labored Psych: calm/pleasant demeanor Neuro: Alert and Oriented x 3, + facial symmetry, speech is clear.  There were no vitals taken for this visit. Wt Readings from Last 3 Encounters:  06/12/23 293 lb 12.8 oz (133.3 kg)  03/19/23 286 lb 9.6 oz (130 kg)  02/26/23 286 lb (129.7 kg)       Assessment & Plan:   Problem List Items Addressed This Visit   None Visit Diagnoses       Acute sinusitis, recurrence not specified, unspecified location    -  Primary   Relevant Medications   doxycycline (VIBRA-TABS) 100 MG tablet      Persistent symptoms for 10 days despite supportive care. Pain primarily above eyes. No known allergies to doxycycline. -Prescribe Doxycycline. -Continue supportive care with neti pot, Flonase. -call if symptoms worsen or if symptoms do not improve with doxycycline.   I am having Ecko K. Hibner start on doxycycline. I am also having [name] maintain [name] calcium carbonate, Multiple Vitamins-Minerals (MULTIVITAMIN & MINERAL PO), famotidine, furosemide, cholecalciferol, acetaminophen, Iron (Ferrous Sulfate), B-12, albuterol, metFORMIN, meloxicam, hydrochlorothiazide, zolpidem, and telmisartan.  Meds ordered this encounter   Medications   doxycycline (VIBRA-TABS) 100 MG tablet    Sig: Take 1 tablet (100 mg total) by mouth 2 (two) times daily.    Dispense:  14 tablet    Refill:  0    Supervising Provider:   Danise Edge A [[id number]]  I discussed the assessment and treatment plan with the patient. The patient was provided an opportunity to ask questions and all were answered. The patient agreed with the plan and demonstrated an understanding of the instructions.   The patient was advised to call back or seek an in-person evaluation if the symptoms worsen or if the condition fails to improve as anticipated.     Lemont Fillers, NP [facility] [phone] (phone) [phone] (fax)   [facility]

## 2023-06-29 NOTE — Progress Notes (Signed)
 Done

## 2023-07-02 ENCOUNTER — Encounter: Payer: Self-pay | Admitting: Family

## 2023-07-03 MED ORDER — DOXYCYCLINE HYCLATE 100 MG PO TABS: 100.0000 mg | ORAL_TABLET | Freq: Two times a day (BID) | ORAL | 0 refills | Status: DC

## 2023-07-03 MED ORDER — PREDNISONE 10 MG PO TABS
ORAL_TABLET | ORAL | 0 refills | Status: DC
Start: 2023-07-03 — End: 2023-08-09

## 2023-07-09 DIAGNOSIS — H43393 Other vitreous opacities, bilateral: Secondary | ICD-10-CM | POA: Diagnosis not present

## 2023-07-09 DIAGNOSIS — E119 Type 2 diabetes mellitus without complications: Secondary | ICD-10-CM | POA: Diagnosis not present

## 2023-07-09 LAB — HM DIABETES EYE EXAM

## 2023-07-16 ENCOUNTER — Ambulatory Visit: Payer: Medicare Other | Admitting: Family Medicine

## 2023-08-06 NOTE — Assessment & Plan Note (Signed)
 Well controlled, no changes to meds. Encouraged heart healthy diet such as the DASH diet and exercise as tolerated.

## 2023-08-06 NOTE — Assessment & Plan Note (Signed)
 Encouraged DASH or MIND diet, decrease po intake and increase exercise as tolerated. Needs 7-8 hours of sleep nightly. Avoid trans fats, eat small, frequent meals every 4-5 hours with lean proteins, complex carbs and healthy fats. Minimize simple carbs, high fat foods and processed foods

## 2023-08-06 NOTE — Assessment & Plan Note (Signed)
 Encourage heart healthy diet such as MIND or DASH diet, increase exercise, avoid trans fats, simple carbohydrates and processed foods, consider a krill or fish or flaxseed oil cap daily.

## 2023-08-06 NOTE — Assessment & Plan Note (Signed)
 Hydrate and monitor

## 2023-08-06 NOTE — Assessment & Plan Note (Signed)
 [id number] acceptable, minimize simple carbs. Increase exercise as tolerated. Continue current meds

## 2023-08-06 NOTE — Assessment & Plan Note (Signed)
 Patient encouraged to maintain heart healthy diet, regular exercise, adequate sleep. Consider daily probiotics. Take medications as prescribed. Labs ordered and reviewed. [facility]GM 2022 pap at [facility] will request from [facility] Dexa 08/2020 repeated 2024 at [facility] will request Colonoscopy 2018 need to repeat every 5 years is due but she did discuss going out 10 years and she agrees to contact her gastroenterologist.  MGM at [facility] will request

## 2023-08-06 NOTE — Assessment & Plan Note (Signed)
 Supplement and monitor

## 2023-08-09 ENCOUNTER — Ambulatory Visit: Payer: Medicare Other | Admitting: Family Medicine

## 2023-08-09 VITALS — BP 114/70 | HR 83 | Temp 98.9°F | Resp 18 | Ht 66.0 in | Wt 285.8 lb

## 2023-08-09 DIAGNOSIS — E782 Mixed hyperlipidemia: Secondary | ICD-10-CM

## 2023-08-09 DIAGNOSIS — E1169 Type 2 diabetes mellitus with other specified complication: Secondary | ICD-10-CM | POA: Diagnosis not present

## 2023-08-09 DIAGNOSIS — Z0000 Encounter for general adult medical examination without abnormal findings: Secondary | ICD-10-CM | POA: Diagnosis not present

## 2023-08-09 DIAGNOSIS — I4510 Unspecified right bundle-branch block: Secondary | ICD-10-CM

## 2023-08-09 DIAGNOSIS — I10 Essential (primary) hypertension: Secondary | ICD-10-CM

## 2023-08-09 DIAGNOSIS — M109 Gout, unspecified: Secondary | ICD-10-CM

## 2023-08-09 DIAGNOSIS — D649 Anemia, unspecified: Secondary | ICD-10-CM | POA: Diagnosis not present

## 2023-08-09 DIAGNOSIS — E559 Vitamin D deficiency, unspecified: Secondary | ICD-10-CM | POA: Diagnosis not present

## 2023-08-09 DIAGNOSIS — E1140 Type 2 diabetes mellitus with diabetic neuropathy, unspecified: Secondary | ICD-10-CM

## 2023-08-09 DIAGNOSIS — E669 Obesity, unspecified: Secondary | ICD-10-CM

## 2023-08-09 LAB — CBC WITH DIFFERENTIAL/PLATELET
Basophils Absolute: 0.1 10*3/uL (ref 0.0–0.1)
Basophils Relative: 1.2 % (ref 0.0–3.0)
Eosinophils Absolute: 0.1 10*3/uL (ref 0.0–0.7)
Eosinophils Relative: 1 % (ref 0.0–5.0)
HCT: 37.4 % (ref 36.0–46.0)
Hemoglobin: 11.9 g/dL — ABNORMAL LOW (ref 12.0–15.0)
Lymphocytes Relative: 23.4 % (ref 12.0–46.0)
Lymphs Abs: 2 10*3/uL (ref 0.7–4.0)
MCHC: 31.7 g/dL (ref 30.0–36.0)
MCV: 83.5 fl (ref 78.0–100.0)
Monocytes Absolute: 0.6 10*3/uL (ref 0.1–1.0)
Monocytes Relative: 7.7 % (ref 3.0–12.0)
Neutro Abs: 5.6 10*3/uL (ref 1.4–7.7)
Neutrophils Relative %: 66.7 % (ref 43.0–77.0)
Platelets: 311 10*3/uL (ref 150.0–400.0)
RBC: 4.48 Mil/uL (ref 3.87–5.11)
RDW: 18 % — ABNORMAL HIGH (ref 11.5–15.5)
WBC: 8.4 10*3/uL (ref 4.0–10.5)

## 2023-08-09 LAB — LIPID PANEL
Cholesterol: 191 mg/dL (ref 0–200)
HDL: 50.3 mg/dL (ref >39.00)
LDL Cholesterol: 114 mg/dL — ABNORMAL HIGH (ref 0–99)
NonHDL: 140.85
Total CHOL/HDL Ratio: 4
Triglycerides: 135 mg/dL (ref 0.0–149.0)
VLDL: 27 mg/dL (ref 0.0–40.0)

## 2023-08-09 LAB — COMPREHENSIVE METABOLIC PANEL
ALT: 25 U/L (ref 0–35)
AST: 24 U/L (ref 0–37)
Albumin: 4.2 g/dL (ref 3.5–5.2)
Alkaline Phosphatase: 74 U/L (ref 39–117)
BUN: 15 mg/dL (ref 6–23)
CO2: 30 meq/L (ref 19–32)
Calcium: 9.5 mg/dL (ref 8.4–10.5)
Chloride: 99 meq/L (ref 96–112)
Creatinine, Ser: 0.87 mg/dL (ref 0.40–1.20)
GFR: 68.16 mL/min (ref >60.00)
Glucose, Bld: 125 mg/dL — ABNORMAL HIGH (ref 70–99)
Potassium: 3.9 meq/L (ref 3.5–5.1)
Sodium: 139 meq/L (ref 135–145)
Total Bilirubin: 0.5 mg/dL (ref 0.2–1.2)
Total Protein: 7 g/dL (ref 6.0–8.3)

## 2023-08-09 LAB — HEMOGLOBIN A1C: Hgb A1c MFr Bld: 6.6 % — ABNORMAL HIGH (ref 4.6–6.5)

## 2023-08-09 LAB — IRON,TIBC AND FERRITIN PANEL
%SAT: 14 % — ABNORMAL LOW (ref 16–45)
Ferritin: 37 ng/mL (ref 16–288)
Iron: 48 ug/dL (ref 45–160)
TIBC: 345 ug/dL (ref 250–450)

## 2023-08-09 LAB — URIC ACID: Uric Acid, Serum: 8.1 mg/dL — ABNORMAL HIGH (ref 2.4–7.0)

## 2023-08-09 LAB — TSH: TSH: 0.77 u[IU]/mL (ref 0.35–5.50)

## 2023-08-09 MED ORDER — EMPAGLIFLOZIN 10 MG PO TABS: 10.0000 mg | ORAL_TABLET | Freq: Every day | ORAL | 3 refills | Status: DC

## 2023-08-09 MED ORDER — BUSPIRONE HCL 5 MG PO TABS: 5.0000 mg | ORAL_TABLET | Freq: Two times a day (BID) | ORAL | 2 refills | Status: DC

## 2023-08-09 MED ORDER — COLCHICINE 0.6 MG PO TABS: ORAL_TABLET | ORAL | 1 refills | Status: DC

## 2023-08-09 NOTE — Patient Instructions (Addendum)
 Acetaminophen 3000 mg in 24 hours Ibuprofen 2400 mg in 24 hours, max of 400 mg at one time  Calm APP  Preventive Care 65 Years and Older, Female Preventive care refers to lifestyle choices and visits with your health care provider that can promote health and wellness. Preventive care visits are also called wellness exams. What can I expect for my preventive care visit? Counseling Your health care provider may ask you questions about your: Medical history, including: Past medical problems. Family medical history. Pregnancy and menstrual history. History of falls. Current health, including: Memory and ability to understand (cognition). Emotional well-being. Home life and relationship well-being. Sexual activity and sexual health. Lifestyle, including: Alcohol, nicotine or tobacco, and drug use. Access to firearms. Diet, exercise, and sleep habits. Work and work Astronomer. Sunscreen use. Safety issues such as seatbelt and bike helmet use. Physical exam Your health care provider will check your: Height and weight. These may be used to calculate your BMI (body mass index). BMI is a measurement that tells if you are at a healthy weight. Waist circumference. This measures the distance around your waistline. This measurement also tells if you are at a healthy weight and may help predict your risk of certain diseases, such as type 2 diabetes and high blood pressure. Heart rate and blood pressure. Body temperature. Skin for abnormal spots. What immunizations do I need?  Vaccines are usually given at various ages, according to a schedule. Your health care provider will recommend vaccines for you based on your age, medical history, and lifestyle or other factors, such as travel or where you work. What tests do I need? Screening Your health care provider may recommend screening tests for certain conditions. This may include: Lipid and cholesterol levels. Hepatitis C test. Hepatitis B  test. HIV (human immunodeficiency virus) test. STI (sexually transmitted infection) testing, if you are at risk. Lung cancer screening. Colorectal cancer screening. Diabetes screening. This is done by checking your blood sugar (glucose) after you have not eaten for a while (fasting). Mammogram. Talk with your health care provider about how often you should have regular mammograms. BRCA-related cancer screening. This may be done if you have a family history of breast, ovarian, tubal, or peritoneal cancers. Bone density scan. This is done to screen for osteoporosis. Talk with your health care provider about your test results, treatment options, and if necessary, the need for more tests. Follow these instructions at home: Eating and drinking  Eat a diet that includes fresh fruits and vegetables, whole grains, lean protein, and low-fat dairy products. Limit your intake of foods with high amounts of sugar, saturated fats, and salt. Take vitamin and mineral supplements as recommended by your health care provider. Do not drink alcohol if your health care provider tells you not to drink. If you drink alcohol: Limit how much you have to 0-1 drink a day. Know how much alcohol is in your drink. In the [place] one drink equals one 12 oz bottle of beer (355 mL), one 5 oz glass of wine (148 mL), or one 1 oz glass of hard liquor (44 mL). Lifestyle Brush your teeth every morning and night with fluoride toothpaste. Floss one time each day. Exercise for at least 30 minutes 5 or more days each week. Do not use any products that contain nicotine or tobacco. These products include cigarettes, chewing tobacco, and vaping devices, such as e-cigarettes. If you need help quitting, ask your health care provider. Do not use drugs. If you are sexually  active, practice safe sex. Use a condom or other form of protection in order to prevent STIs. Take aspirin only as told by your health care provider. Make sure that you  understand how much to take and what form to take. Work with your health care provider to find out whether it is safe and beneficial for you to take aspirin daily. Ask your health care provider if you need to take a cholesterol-lowering medicine (statin). Find healthy ways to manage stress, such as: Meditation, yoga, or listening to music. Journaling. Talking to a trusted person. Spending time with friends and family. Minimize exposure to UV radiation to reduce your risk of skin cancer. Safety Always wear your seat belt while driving or riding in a vehicle. Do not drive: If you have been drinking alcohol. Do not ride with someone who has been drinking. When you are tired or distracted. While texting. If you have been using any mind-altering substances or drugs. Wear a helmet and other protective equipment during sports activities. If you have firearms in your house, make sure you follow all gun safety procedures. What's next? Visit your health care provider once a year for an annual wellness visit. Ask your health care provider how often you should have your eyes and teeth checked. Stay up to date on all vaccines. This information is not intended to replace advice given to you by your health care provider. Make sure you discuss any questions you have with your health care provider. Document Revised: 11/10/2020 Document Reviewed: 11/10/2020 Elsevier Patient Education  2024 [employer].

## 2023-08-12 ENCOUNTER — Other Ambulatory Visit: Payer: Self-pay | Admitting: Family Medicine

## 2023-08-12 ENCOUNTER — Encounter: Payer: Self-pay | Admitting: Family Medicine

## 2023-08-12 MED ORDER — COLCHICINE 0.6 MG PO TABS: 0.6000 mg | ORAL_TABLET | Freq: Every day | ORAL | 3 refills | Status: DC

## 2023-08-12 NOTE — Progress Notes (Signed)
 Subjective:    Patient ID: [name], female    DOB: 20-May-1956, 68 y.o.   MRN: [id number]  Chief Complaint  Patient presents with  . Annual Exam    HPI Discussed the use of AI scribe software for clinical note transcription with the patient, who gave verbal consent to proceed.  History of Present Illness The patient is a 68 year old with gout who presents with foot pain.  She has been experiencing severe pain in her foot since Tuesday, which she attributes to gout. The pain has been persistent and significantly discomforting. She has increased her water intake, drinking approximately five bottles on Tuesday and Wednesday, to help manage the symptoms. She has not been taking any specific medication for gout recently but has started a new regimen that includes ibuprofen and acetaminophen, taken four times a day.  She has a history of adverse reactions to allopurinol, which she believes caused issues in the past. She has previously taken colchicine without recalling any specific problems. She mentions a past experience with Wellbutrin, which she discontinued due to side effects such as involuntary movements and jaw clenching.  She has been dealing with a persistent sinus infection since January, treated with antibiotics and prednisone. Despite treatment, symptoms have recurred with changes in weather, and she continues to experience a raspy voice and sinus congestion. She uses a CPAP machine regularly due to sleep apnea and reports significant improvement in her symptoms with its use, although sinus congestion has affected her CPAP use.  She has experienced significant stress due to family issues, including her daughter's Lyme disease and her son-in-law's stroke. This stress has contributed to her depression and anxiety, leading to a period where she canceled all appointments and isolated herself. She reports a loss of nine pounds, which she attributes to decreased appetite during  this period.  She has a history of a right bundle branch block identified during an ER visit in October, which was not deemed urgent at the time. She has not followed up with cardiology due to long wait times and her mental health struggles.    Past Medical History:  Diagnosis Date  . Allergic rhinitis   . Allergy   . Anemia 07/17/2016  . Anxiety   . Asthma    seasonal  . Blood transfusion without reported diagnosis   . Breast cancer (HCC)    right  . Bronchitis   . Colon polyps   . CPAP (continuous positive airway pressure) dependence   . Depression   . Diabetes mellitus    borderline but takes metformin  . Edema   . Family history of breast cancer in first degree relative   . Fibromyalgia   . GERD (gastroesophageal reflux disease)   . Hyperglycemia 03/05/2017  . Hyperlipidemia 08/21/2016   no meds  . Hyperparathyroidism   . Hypertension    controlled by medications  . Joint pain   . Neuromuscular disorder (HCC)    Fibromyalgia  . Obesity   . Osteoarthritis    RA  . PONV (postoperative nausea and vomiting)    occasional  . Pre-diabetes   . Sleep apnea    wears cpap  . Viral meningitis   . Vitamin D deficiency     Past Surgical History:  Procedure Laterality Date  . ABDOMINAL HYSTERECTOMY  1998  . ADENOIDECTOMY    . BACK SURGERY     09/21/21  . BREAST EXCISIONAL BIOPSY Left 1993   benign  . BREAST SURGERY  Tran flap due to breast cancer  . CESAREAN SECTION  1988  . COLONOSCOPY  08/14/2011  . HAND SURGERY     dog bite, right hand  . HAND SURGERY     trauma, left hand  . KNEE ARTHROSCOPY     bilateral  . left knee replacement  2010  . LUMBAR WOUND DEBRIDEMENT N/A 10/07/2021   Procedure: LUMBAR WOUND DEBRIDEMENT/REVISION;  Surgeon: Coletta Memos, MD;  Location: [facility] OR;  Service: Neurosurgery;  Laterality: N/A;  3C/RM 21 to follow Dr Conchita Paris  . MASTECTOMY  01/13/1994   Right breast  . parathyroid resection    . PARATHYROIDECTOMY    . POLYPECTOMY     . rectal abscess    . SEPTOPLASTY  1980  . SHOULDER ARTHROSCOPY Right 11/09/2015   Procedure: ARTHROSCOPY SHOULDER-acromioplasty, distal clavicle resection and debridement;  Surgeon: Marcene Corning, MD;  Location: [facility] OR;  Service: Orthopedics;  Laterality: Right;  . shoulder arthroscopy     rotator cuff repair  . TOE SURGERY Right    paronychia and adenoma removed  . TONSILLECTOMY    . TOTAL KNEE ARTHROPLASTY  06/18/2008   Daldorf  . TUMOR REMOVAL  1982   , scalp    Family History  Problem Relation Age of Onset  . Emphysema Father   . Lung cancer Father        lung ca dx 89  . Other Father        prostate issues  . Diabetes Father   . Breast cancer Maternal Aunt        dx late 36s  . Colon cancer Maternal Aunt        dx 44s  . Colon polyps Maternal Aunt   . Prostate cancer Maternal Grandfather 85       d. 85y  . Heart disease Paternal Grandfather   . Heart attack Paternal Grandfather        d. 50y  . Diabetes Paternal Grandfather   . Asthma Daughter        "seasonal"  . Arthritis Maternal Grandmother   . Aneurysm Maternal Grandmother        d. brain aneurysm at 37  . Arthritis Mother   . Colon polyps Mother   . Hypertension Mother   . Sleep apnea Mother   . Multiple sclerosis Sister   . Breast cancer Sister 18       L IDC and DCIS; ER/PR+, Her2-  . Fibrocystic breast disease Sister   . Arthritis/Rheumatoid Sister   . Breast cancer Sister   . Cancer Maternal Uncle        d. mouth cancer at younger age; smoker  . Other Paternal Uncle        muscle issues - couldn't walk or talk; d. 20y  . Cancer Maternal Aunt        lymphoma, dx 16s  . Ovarian cancer Maternal Aunt        dx 37s; d. late 36s  . Lung cancer Maternal Uncle        d. 75y; former smoker  . Throat cancer Cousin        maternal 1st cousin; smoker  . Breast cancer Cousin 53       maternal 1st cousin  . Other Paternal Uncle        prostate issues  . Breast cancer Cousin        paternal 1st  cousin dx late 69s  . Throat cancer Cousin  maternal 1st cousin; used SL tobacco  . Prostate cancer Cousin        maternal 1st cousin dx 60s  . Esophageal cancer Neg Hx   . Rectal cancer Neg Hx   . Stomach cancer Neg Hx     Social History   Socioeconomic History  . Marital status: Widowed    Spouse name: Not on file  . Number of children: 1  . Years of education: Not on file  . Highest education level: 12th grade  Occupational History  . Occupation: ACCOUNTING    Employer: [employer]  Tobacco Use  . Smoking status: Never  . Smokeless tobacco: Never  Vaping Use  . Vaping status: Never Used  Substance and Sexual Activity  . Alcohol use: Not Currently  . Drug use: No  . Sexual activity: Not Currently  Other Topics Concern  . Not on file  Social History Narrative  . Not on file   Social Drivers of Health   Financial Resource Strain: Low Risk  (05/02/2023)   Overall Financial Resource Strain (CARDIA)   . Difficulty of Paying Living Expenses: Not hard at all  Food Insecurity: No Food Insecurity (05/02/2023)   Hunger Vital Sign   . Worried About Programme researcher, broadcasting/film/video in the Last Year: Never true   . Ran Out of Food in the Last Year: Never true  Transportation Needs: No Transportation Needs (05/02/2023)   PRAPARE - Transportation   . Lack of Transportation (Medical): No   . Lack of Transportation (Non-Medical): No  Physical Activity: Insufficiently Active (05/02/2023)   Exercise Vital Sign   . Days of Exercise per Week: 4 days   . Minutes of Exercise per Session: 10 min  Stress: Stress Concern Present (05/02/2023)   [employer] of Occupational Health - Occupational Stress Questionnaire   . Feeling of Stress : To some extent  Social Connections: Moderately Integrated (05/02/2023)   Social Connection and Isolation Panel [NHANES]   . Frequency of Communication with Friends and Family: More than three times a week   . Frequency of Social Gatherings with Friends  and Family: More than three times a week   . Attends Religious Services: More than 4 times per year   . Active Member of Clubs or Organizations: Yes   . Attends Banker Meetings: More than 4 times per year   . Marital Status: Widowed  Intimate Partner Violence: Not At Risk (12/20/2022)   Humiliation, Afraid, Rape, and Kick questionnaire   . Fear of Current or Ex-Partner: No   . Emotionally Abused: No   . Physically Abused: No   . Sexually Abused: No    Outpatient Medications Prior to Visit  Medication Sig Dispense Refill  . acetaminophen (TYLENOL) 650 MG CR tablet Take 1,300 mg by mouth every 8 (eight) hours as needed for pain.    Marland Kitchen albuterol (VENTOLIN HFA) 108 (90 Base) MCG/ACT inhaler Inhale 2 puffs into the lungs every 6 (six) hours as needed for wheezing or shortness of breath. 8 g 0  . calcium carbonate (OSCAL) 1500 (600 Ca) MG TABS tablet Take 1,500 mg by mouth 2 (two) times daily with a meal.    . cholecalciferol (VITAMIN D3) 25 MCG (1000 UNIT) tablet Take 1,000 Units by mouth daily.    . Cyanocobalamin (B-12) 1000 MCG CAPS Take 1 capsule by mouth daily at 6 (six) AM.    . famotidine (PEPCID) 40 MG tablet Take 1 tablet (40 mg total) by mouth  daily. 30 tablet 5  . furosemide (LASIX) 40 MG tablet TAKE 1 TABLET BY MOUTH  TWICE DAILY (Patient taking differently: Take 40 mg by mouth daily.) 180 tablet 3  . hydrochlorothiazide (MICROZIDE) 12.5 MG capsule Take 1 capsule (12.5 mg total) by mouth daily. 90 capsule 3  . metFORMIN (GLUCOPHAGE) 500 MG tablet TAKE 1 TABLET BY MOUTH TWICE  DAILY WITH A MEAL 200 tablet 2  . Multiple Vitamins-Minerals (MULTIVITAMIN & MINERAL PO) Take 1 tablet by mouth daily.    Marland Kitchen telmisartan (MICARDIS) 20 MG tablet TAKE 1 TABLET BY MOUTH DAILY 100 tablet 0  . doxycycline (VIBRA-TABS) 100 MG tablet Take 1 tablet (100 mg total) by mouth 2 (two) times daily. 6 tablet 0  . Iron, Ferrous Sulfate, 325 (65 Fe) MG TABS Take 325 mg by mouth every other day. 30  tablet   . meloxicam (MOBIC) 7.5 MG tablet Take 1-2 tablets (7.5-15 mg total) by mouth daily as needed for pain. 60 tablet 1  . predniSONE (DELTASONE) 10 MG tablet 4 tabs by mouth once daily for 2 days, then 3 tabs daily x 2 days, then 2 tabs daily x 2 days, then 1 tab daily x 2 days 20 tablet 0  . zolpidem (AMBIEN) 10 MG tablet Take 0.5-1 tablets (5-10 mg total) by mouth at bedtime as needed for sleep. 30 tablet 2   No facility-administered medications prior to visit.    Allergies  Allergen Reactions  . Allopurinol Hives  . Mucinex [Guaifenesin Er] Shortness Of Breath  . Lyrica [Pregabalin] Swelling  . Ozempic (0.25 Or 0.5 Mg-Dose) [Semaglutide(0.25 Or 0.5mg -Dos)] Other (See Comments)    Abdominal pain  . Penicillins Hives and Other (See Comments)    Has patient had a PCN reaction causing immediate rash, facial/tongue/throat swelling, SOB or lightheadedness with hypotension: no Has patient had a PCN reaction causing severe rash involving mucus membranes or skin necrosis: no Has patient had a PCN reaction that required hospitalization no Has patient had a PCN reaction occurring within the last 10 years: no If all of the above answers are "NO", then may proceed with Cephalosporin use.   . Prozac [Fluoxetine Hcl] Hives  . Amitriptyline Other (See Comments)    "felt weird, fatigue, dizziness"  . Bupropion Other (See Comments)    Uncontrolled movements of tongue, hand  . Lexapro [Escitalopram Oxalate]     Nausea and hypersalivation.   Greggory Keen [Tirzepatide]     Review of Systems  Constitutional:  Negative for fever and malaise/fatigue.  HENT:  Positive for congestion and sinus pain.   Eyes:  Negative for blurred vision.  Respiratory:  Negative for shortness of breath.   Cardiovascular:  Negative for chest pain, palpitations and leg swelling.  Gastrointestinal:  Negative for abdominal pain, blood in stool and nausea.  Genitourinary:  Negative for dysuria and frequency.   Musculoskeletal:  Positive for joint pain and myalgias. Negative for falls.  Skin:  Negative for rash.  Neurological:  Negative for dizziness, loss of consciousness and headaches.  Endo/Heme/Allergies:  Negative for environmental allergies.  Psychiatric/Behavioral:  Positive for depression. The patient is nervous/anxious.        Objective:    Physical Exam Constitutional:      General: She is not in acute distress.    Appearance: Normal appearance. She is well-developed. She is not toxic-appearing.  HENT:     Head: Normocephalic and atraumatic.     Right Ear: External ear normal.     Left Ear: External  ear normal.     Nose: Nose normal.  Eyes:     General:        Right eye: No discharge.        Left eye: No discharge.     Conjunctiva/sclera: Conjunctivae normal.  Neck:     Thyroid: No thyromegaly.  Cardiovascular:     Rate and Rhythm: Normal rate and regular rhythm.     Heart sounds: Normal heart sounds. No murmur heard. Pulmonary:     Effort: Pulmonary effort is normal. No respiratory distress.     Breath sounds: Normal breath sounds.  Abdominal:     General: Bowel sounds are normal.     Palpations: Abdomen is soft.     Tenderness: There is no abdominal tenderness. There is no guarding.  Musculoskeletal:        General: Swelling and tenderness present. Normal range of motion.     Cervical back: Neck supple.     Comments: Left second toe swollen and erythematous  Lymphadenopathy:     Cervical: No cervical adenopathy.  Skin:    General: Skin is warm and dry.  Neurological:     Mental Status: She is alert and oriented to person, place, and time.  Psychiatric:        Mood and Affect: Mood normal.        Behavior: Behavior normal.        Thought Content: Thought content normal.        Judgment: Judgment normal.    BP 114/70 (BP Location: Left Arm, Patient Position: Sitting, Cuff Size: Large)   Pulse 83   Temp 98.9 F (37.2 C) (Oral)   Resp 18   Ht 5\' 6"  (1.676  m)   Wt 285 lb 12.8 oz (129.6 kg)   SpO2 97%   BMI 46.13 kg/m  Wt Readings from Last 3 Encounters:  08/09/23 285 lb 12.8 oz (129.6 kg)  06/12/23 293 lb 12.8 oz (133.3 kg)  03/19/23 286 lb 9.6 oz (130 kg)    Diabetic Foot Exam - Simple   Simple Foot Form Diabetic Foot exam was performed with the following findings: Yes 08/09/2023 10:25 AM  Visual Inspection No deformities, no ulcerations, no other skin breakdown bilaterally: Yes See comments: Yes Sensation Testing Intact to touch and monofilament testing bilaterally: Yes Pulse Check Posterior Tibialis and Dorsalis pulse intact bilaterally: Yes Comments Left second toe red and swollen at DIP    Lab Results  Component Value Date   WBC 8.4 08/09/2023   HGB 11.9 (L) 08/09/2023   HCT 37.4 08/09/2023   PLT 311.0 08/09/2023   GLUCOSE 125 (H) 08/09/2023   CHOL 191 08/09/2023   TRIG 135.0 08/09/2023   HDL 50.30 08/09/2023   LDLDIRECT 110.0 08/21/2016   LDLCALC 114 (H) 08/09/2023   ALT 25 08/09/2023   AST 24 08/09/2023   NA 139 08/09/2023   K 3.9 08/09/2023   CL 99 08/09/2023   CREATININE 0.87 08/09/2023   BUN 15 08/09/2023   CO2 30 08/09/2023   TSH 0.77 08/09/2023   INR 2.0 (H) 06/21/2008   HGBA1C 6.6 (H) 08/09/2023   MICROALBUR <0.7 02/26/2023    Lab Results  Component Value Date   TSH 0.77 08/09/2023   Lab Results  Component Value Date   WBC 8.4 08/09/2023   HGB 11.9 (L) 08/09/2023   HCT 37.4 08/09/2023   MCV 83.5 08/09/2023   PLT 311.0 08/09/2023   Lab Results  Component Value Date   NA 139  08/09/2023   K 3.9 08/09/2023   CO2 30 08/09/2023   GLUCOSE 125 (H) 08/09/2023   BUN 15 08/09/2023   CREATININE 0.87 08/09/2023   BILITOT 0.5 08/09/2023   ALKPHOS 74 08/09/2023   AST 24 08/09/2023   ALT 25 08/09/2023   PROT 7.0 08/09/2023   ALBUMIN 4.2 08/09/2023   CALCIUM 9.5 08/09/2023   ANIONGAP 8 03/19/2023   GFR 68.16 08/09/2023   Lab Results  Component Value Date   CHOL 191 08/09/2023   Lab Results   Component Value Date   HDL 50.30 08/09/2023   Lab Results  Component Value Date   LDLCALC 114 (H) 08/09/2023   Lab Results  Component Value Date   TRIG 135.0 08/09/2023   Lab Results  Component Value Date   CHOLHDL 4 08/09/2023   Lab Results  Component Value Date   HGBA1C 6.6 (H) 08/09/2023       Assessment & Plan:  Essential hypertension Assessment & Plan: Well controlled, no changes to meds. Encouraged heart healthy diet such as the DASH diet and exercise as tolerated.    Orders: -     TSH; Future -     Ambulatory referral to Cardiology  Acute gout involving toe of left foot, unspecified cause Assessment & Plan: Hydrate and monitor   Orders: -     Uric acid  Mixed hyperlipidemia Assessment & Plan: Encourage heart healthy diet such as MIND or DASH diet, increase exercise, avoid trans fats, simple carbohydrates and processed foods, consider a krill or fish or flaxseed oil cap daily.   Orders: -     Lipid panel; Future -     Ambulatory referral to Cardiology  Morbid obesity [facility]) Assessment & Plan: Encouraged DASH or MIND diet, decrease po intake and increase exercise as tolerated. Needs 7-8 hours of sleep nightly. Avoid trans fats, eat small, frequent meals every 4-5 hours with lean proteins, complex carbs and healthy fats. Minimize simple carbs, high fat foods and processed foods    Vitamin D deficiency Assessment & Plan: Supplement and monitor    Type 2 diabetes mellitus with diabetic neuropathy, without long-term current use of insulin (HCC) Assessment & Plan: hgba1c acceptable, minimize simple carbs. Increase exercise as tolerated. Continue current meds   Orders: -     Comprehensive metabolic panel; Future -     Hemoglobin A1c; Future  Preventative health care Assessment & Plan: Patient encouraged to maintain heart healthy diet, regular exercise, adequate sleep. Consider daily probiotics. Take medications as prescribed. Labs ordered and reviewed.  [facility]GM 2022 pap at [facility] will request from [facility] Dexa 08/2020 repeated 2024 at [facility] will request Colonoscopy 2018 need to repeat every 5 years is due but she did discuss going out 10 years and she agrees to contact her gastroenterologist.  MGM at [facility] will request    Anemia, unspecified type -     CBC with Differential/Platelet; Future -     Iron, TIBC and Ferritin Panel; Future -     Ambulatory referral to Cardiology  Type 2 diabetes mellitus with obesity (HCC) Assessment & Plan: hgba1c acceptable, minimize simple carbs. Increase exercise as tolerated. Continue current meds   Orders: -     Ambulatory referral to Cardiology -     AMB Referral [facility] Care Management  RBBB -     Ambulatory referral to Cardiology  Other orders -     busPIRone HCl; Take 1 tablet (5 mg total) by mouth 2 (two) times daily.  Dispense: 60  tablet; Refill: 2 -     Empagliflozin; Take 1 tablet (10 mg total) by mouth daily before breakfast.  Dispense: 30 tablet; Refill: 3    Assessment and Plan Assessment & Plan Gout of big toe Experiencing a gout flare in the big toe. Colchicine explained for acute attacks. Uloric considered if allopurinol is not tolerated. - Order blood work to check uric acid levels. - Prescribe colchicine with instructions to take two tablets immediately, then one every two hours for a total of six tablets or until pain is controlled or diarrhea occurs. - Consider Uloric if allopurinol is not tolerated. - Discuss the possibility of using one colchicine daily for management if uric acid levels are high. - Advise to continue drinking water to help manage gout symptoms.  Depression with anxiety Significant improvement in symptoms. Severe reaction to Wellbutrin led to discontinuation. Discussed Buspar as an alternative. Explained dosing flexibility and potential side effects. - Start Buspar 5 mg orally twice daily. - Schedule a virtual follow-up in 8-10 weeks to assess progress with Buspar. -  Consider increasing Buspar dosage if no relief is observed, with the possibility of adjusting the dose between visits if needed. - Recommend the Calm app for stress management and coping with anxiety triggers. - Encourage considering counseling in the future as a tool for managing stressors.  Sinusitis Sinus infection with recurring symptoms. Previous antibiotic and prednisone provided temporary relief. - Encourage increased water intake to help alleviate symptoms. - Continue taking Mucinex twice daily and Claritin. - Use Flonase as directed for additional symptom relief.  CPAP dependence Dependent on CPAP for sleep apnea. Reported sore throat and sinus issues. Emphasized importance of continued CPAP use. - Continue using CPAP machine nightly. - Consider consulting a different pulmonologist if not satisfied with current care.  Weight loss Unintentional weight loss likely due to decreased appetite from depression and anxiety. Discussed Rybelsus for appetite and weight management. - Start Rybelsus, a once-daily oral medication, to help with appetite, weight management, and blood sugar control. - Set up a consultation with the pharmacist to explore coverage options for Rybelsus. - Encourage checking blood sugar levels once a week and use the readings as a learning tool to manage diabetes.  Colonoscopy follow-up Last colonoscopy in 2018 with a recommendation for a repeat in five years. Records indicate a five-year interval for surveillance. - Contact gastroenterology office to clarify the recommended interval for the next colonoscopy. - Consider extending the interval to ten years if comfortable and the gastroenterology office agrees.     Danise Edge, MD

## 2023-08-13 ENCOUNTER — Telehealth: Payer: Self-pay

## 2023-08-13 NOTE — Progress Notes (Signed)
 Complex Care Management Note  Care Guide Note 08/13/2023 Name: Malka Bocek MRN: [id number] DOB: Oct 05, 1955  Mariacristina Juelle Dickmann is a 68 y.o. year old female who sees Bradd Canary, MD for primary care. I reached out to Tonjia Rochele Pages by phone today to offer complex care management services.  Ms. Rincon was given information about Complex Care Management services today including:   The Complex Care Management services include support from the care team which includes your Nurse Care Manager, Clinical Social Worker, or Pharmacist.  The Complex Care Management team is here to help remove barriers to the health concerns and goals most important to you. Complex Care Management services are voluntary, and the patient may decline or stop services at any time by request to their care team member.   Complex Care Management Consent Status: Patient agreed to services and verbal consent obtained.   Follow up plan:  Telephone appointment with complex care management team member scheduled for:  08/20/23 at 3:00 p.m.   Encounter Outcome:  Patient Scheduled  [facility]  [facility] Health Care Management Assistant Direct Dial: [phone]  Fax: [phone]

## 2023-08-16 ENCOUNTER — Telehealth: Payer: Self-pay | Admitting: Pulmonary Disease

## 2023-08-16 ENCOUNTER — Other Ambulatory Visit (HOSPITAL_BASED_OUTPATIENT_CLINIC_OR_DEPARTMENT_OTHER): Payer: Self-pay

## 2023-08-16 DIAGNOSIS — G4733 Obstructive sleep apnea (adult) (pediatric): Secondary | ICD-10-CM

## 2023-08-16 NOTE — Telephone Encounter (Signed)
 Rx sent electronically to DME and requested notes faxed

## 2023-08-16 NOTE — Telephone Encounter (Signed)
 Signed RX for supplies and a sleep study showing date it was completed and the last OV notes.   [facility] calling on   Fax is [phone] Attn: ATC  Last see 06/12/2023

## 2023-08-20 ENCOUNTER — Encounter: Payer: Self-pay | Admitting: Emergency Medicine

## 2023-08-20 ENCOUNTER — Telehealth

## 2023-08-20 ENCOUNTER — Encounter: Payer: Self-pay | Admitting: Family Medicine

## 2023-08-21 ENCOUNTER — Encounter: Payer: Self-pay | Admitting: Emergency Medicine

## 2023-08-22 ENCOUNTER — Ambulatory Visit (INDEPENDENT_AMBULATORY_CARE_PROVIDER_SITE_OTHER): Admitting: Pharmacist

## 2023-08-22 DIAGNOSIS — E782 Mixed hyperlipidemia: Secondary | ICD-10-CM

## 2023-08-22 NOTE — Telephone Encounter (Signed)
 Appointment has been made for tomorrow (3/27) with [name]

## 2023-08-22 NOTE — Telephone Encounter (Signed)
 NFN

## 2023-08-22 NOTE — Progress Notes (Signed)
 08/22/2023 Name: Rachana Malesky MRN: [id number] DOB: 1955/07/17  Chief Complaint  Patient presents with   Medication Management    [name] is a 68 y.o. year old female who presented for a telephone visit.   They were referred to the pharmacist by their PCP for assistance in managing medication access.    Subjective:    Medication Access/Adherence  Current Pharmacy:  [facility] [address] [place], [place] - [address] [place] [address] Phone: [phone] Fax: [phone]  [facility] [place], [place] - [address] [place] [address] Phone: [phone] Fax: [phone]  [facility] [address] [place] [address] Phone: [phone] Fax: [phone]   Patient reports affordability concerns with their medications: Yes  Patient reports access/transportation concerns to their pharmacy: No  Patient reports adherence concerns with their medications:  No      Diabetes:  Current medications: metformin 500mg  twice a day with meals Medications tried in the past: ozempic - caused abdominal pain and burning  Dr Abner Greenspan has prescribed Jardiance 10mg  daily. Patient would like to stop metformin however she has not been able to afford Jardiance - has $255 deductible and then cost will be $47 per month.   Hyperlipidemia/ASCVD Risk Reduction  Current lipid lowering medications: none currently Medications tried in the past: atorvastatin - 10mg  twice a week - took in 2022. Noted that it was stopped due ot patient preference but Mrs. Nogales does not remember taking atorvastatin or any medications for her cholesterol before.    Objective:  Lab Results  Component Value Date   HGBA1C 6.6 (H) 08/09/2023    Lab Results  Component Value Date   CREATININE 0.87 08/09/2023   BUN 15 08/09/2023    NA 139 08/09/2023   K 3.9 08/09/2023   CL 99 08/09/2023   CO2 30 08/09/2023    Lab Results  Component Value Date   CHOL 191 08/09/2023   HDL 50.30 08/09/2023   LDLCALC 114 (H) 08/09/2023   LDLDIRECT 110.0 08/21/2016   TRIG 135.0 08/09/2023   CHOLHDL 4 08/09/2023    Medications Reviewed Today     Reviewed by Henrene Pastor, RPH-CPP (Pharmacist) on 08/22/23 at 1559  Med List Status: <None>   Medication Order Taking? Sig Documenting Provider Last Dose Status Informant  acetaminophen (TYLENOL) 650 MG CR tablet [id number] Yes Take 1,300 mg by mouth every 8 (eight) hours as needed for pain. [provider] Taking Active Self, Pharmacy Records  albuterol (VENTOLIN HFA) 108 (90 Base) MCG/ACT inhaler [id number] Yes Inhale 2 puffs into the lungs every 6 (six) hours as needed for wheezing or shortness of breath. Sandford Craze, NP Taking Active   busPIRone (BUSPAR) 5 MG tablet [id number] Yes Take 1 tablet (5 mg total) by mouth 2 (two) times daily. Bradd Canary, MD Taking Active   calcium carbonate (OSCAL) 1500 (600 Ca) MG TABS tablet [id number] Yes Take 1,500 mg by mouth 2 (two) times daily with a meal. [provider] Taking Active Self, Pharmacy Records  cholecalciferol (VITAMIN D3) 25 MCG (1000 UNIT) tablet [id number] Yes Take 1,000 Units by mouth daily. [provider] Taking Active Self, Pharmacy Records  colchicine 0.6 MG tablet [id number] Yes Take 1 tablet (0.6 mg total) by mouth daily. 2 tabs po once and then 1 tab every 2 hours up to max of  6 tabs in 24 hours prn gout Bradd Canary, MD Taking Active   Cyanocobalamin (B-12) 1000 MCG CAPS [id number] Yes Take 1 capsule by mouth daily at 6 (six) AM. Sandford Craze, NP Taking Active Self, Pharmacy Records  empagliflozin (JARDIANCE) 10 MG TABS tablet [id number] No Take 1 tablet (10 mg total) by mouth daily before breakfast.  Patient not taking: Reported on 08/22/2023   Bradd Canary, MD Not Taking Active    famotidine (PEPCID) 40 MG tablet [id number] Yes Take 1 tablet (40 mg total) by mouth daily. Bradd Canary, MD Taking Active Self, Pharmacy Records  furosemide (LASIX) 40 MG tablet [id number] Yes TAKE 1 TABLET BY MOUTH  TWICE DAILY  Patient taking differently: Take 40 mg by mouth daily.   Bradd Canary, MD Taking Active Self, Pharmacy Records  hydrochlorothiazide (MICROZIDE) 12.5 MG capsule [id number] Yes Take 1 capsule (12.5 mg total) by mouth daily. Bradd Canary, MD Taking Active   metFORMIN (GLUCOPHAGE) 500 MG tablet [id number] Yes TAKE 1 TABLET BY MOUTH TWICE  DAILY WITH A MEAL Bradd Canary, MD Taking Active   Multiple Vitamins-Minerals (MULTIVITAMIN & MINERAL PO) [id number] Yes Take 1 tablet by mouth daily. [provider] Taking Active Self, Pharmacy Records  telmisartan (MICARDIS) 20 MG tablet [id number] Yes TAKE 1 TABLET BY MOUTH DAILY Bradd Canary, MD Taking Active               Assessment/Plan:   Diabetes: Last A1c was at goal of < 7.0 but patient would like to switch from metformin to Jardiance due to potential metformin side effects and because Jardiance has additional CKD and cardiovascular disease benefits. - Recommend to continue metformin for now.  - Meets financial criteria for Jardiance or [place] patient assistance program through [facility] or [place] and [place]. Emailed patient application for Jardiance / [employer] today at her request so she can complete and bring when she is in the office tomorrow.    Hyperlipidemia/ASCVD Risk Reduction: LDL goal < 100 - not at goal currently - Discussed recent lipids panel. Patient is agreeable to starting statin therapy.  - Will check with PCP about starting rosuvastatin 5mg  take 1 tablet daily   Gout:  - Discussed foods that can increase acute gout episodes.  - Discussed using Tart Cherry daily for gout prevention.    Follow Up Plan: 2 weeks  Henrene Pastor, PharmD Clinical Pharmacist [facility]

## 2023-08-23 ENCOUNTER — Ambulatory Visit: Admitting: Nurse Practitioner

## 2023-08-23 ENCOUNTER — Ambulatory Visit: Admitting: Family Medicine

## 2023-08-23 ENCOUNTER — Encounter: Payer: Self-pay | Admitting: Medical

## 2023-08-23 ENCOUNTER — Ambulatory Visit: Admitting: Medical

## 2023-08-23 VITALS — BP 124/60 | HR 78 | Temp 98.3°F | Resp 18 | Ht 66.0 in | Wt 284.0 lb

## 2023-08-23 DIAGNOSIS — L089 Local infection of the skin and subcutaneous tissue, unspecified: Secondary | ICD-10-CM | POA: Diagnosis not present

## 2023-08-23 LAB — CBC WITH DIFFERENTIAL/PLATELET
Basophils Absolute: 0 10*3/uL (ref 0.0–0.1)
Basophils Relative: 0.6 % (ref 0.0–3.0)
Eosinophils Absolute: 0.1 10*3/uL (ref 0.0–0.7)
Eosinophils Relative: 1.3 % (ref 0.0–5.0)
HCT: 38.4 % (ref 36.0–46.0)
Hemoglobin: 12.5 g/dL (ref 12.0–15.0)
Lymphocytes Relative: 36.9 % (ref 12.0–46.0)
Lymphs Abs: 2.8 10*3/uL (ref 0.7–4.0)
MCHC: 32.7 g/dL (ref 30.0–36.0)
MCV: 83.2 fl (ref 78.0–100.0)
Monocytes Absolute: 0.9 10*3/uL (ref 0.1–1.0)
Monocytes Relative: 11.6 % (ref 3.0–12.0)
Neutro Abs: 3.8 10*3/uL (ref 1.4–7.7)
Neutrophils Relative %: 49.6 % (ref 43.0–77.0)
Platelets: 273 10*3/uL (ref 150.0–400.0)
RBC: 4.62 Mil/uL (ref 3.87–5.11)
RDW: 17.7 % — ABNORMAL HIGH (ref 11.5–15.5)
WBC: 7.7 10*3/uL (ref 4.0–10.5)

## 2023-08-23 MED ORDER — DOXYCYCLINE HYCLATE 100 MG PO TABS: 100.0000 mg | ORAL_TABLET | Freq: Two times a day (BID) | ORAL | 0 refills | Status: DC

## 2023-08-23 NOTE — Patient Instructions (Signed)
 Possible recurrent infection at surgical site(though if so then early and likely mild) Concern for recurrent infection at surgical site due to history of delayed healing and recent induration. No current abscess by exam, but precautionary measures advised. - Order CBC to assess for infection. - Prescribe doxycycline 100 mg orally twice daily for 10 days. - Advise to take doxycycline with food to prevent gastrointestinal upset. - Schedule follow-up appointment on Monday afternoon with Dr. Abner Greenspan or current provider to reassess. - Instruct to seek emergency care if significant changes occur over the weekend.

## 2023-08-23 NOTE — Progress Notes (Signed)
 Subjective:    Patient ID: [name], female    DOB: 1956/05/05, 68 y.o.   MRN: [id number]  HPI  Discussed the use of AI scribe software for clinical note transcription with the patient, who gave verbal consent to proceed.  History of Present Illness   [name] is a 68 year old female who presents with a new lump at the site of her previous back surgery.  She noticed the lump last Friday, located at the end of her surgical scar from a back surgery performed two to three years ago. The lump is associated with a pulling sensation but no pain. Initially thought to be scar tissue, a massage therapist suggested it might be something else.  She has a history of delayed healing due to an infection following her back surgery, which took almost a year to resolve with wound care. The lump was red last Friday but is not currently red or discharging. The indurated area measures approximately one centimeter by one centimeter.  No fevers, chills, or sweats. Her previous back pain has not recurred since the surgery. She is concerned about the possibility of an infection returning, given her past experience. She is not currently on any medication for this issue but has been prescribed doxycycline in the past for similar concerns.          Review of Systems  Constitutional:  Negative for chills, fatigue and fever.  Respiratory:  Negative for cough, chest tightness, shortness of breath and wheezing.   Cardiovascular:  Negative for chest pain and palpitations.  Gastrointestinal:  Negative for abdominal pain and blood in stool.  Musculoskeletal:  Negative for back pain and joint swelling.  Skin:        See hpi.  Neurological:  Negative for dizziness and numbness.  Hematological:  Negative for adenopathy. Does not bruise/bleed easily.  Psychiatric/Behavioral:  Negative for behavioral problems and decreased concentration.     Past Medical History:  Diagnosis Date   Allergic  rhinitis    Allergy    Anemia 07/17/2016   Anxiety    Asthma    seasonal   Blood transfusion without reported diagnosis    Breast cancer (HCC)    right   Bronchitis    Colon polyps    CPAP (continuous positive airway pressure) dependence    Depression    Diabetes mellitus    borderline but takes metformin   Edema    Family history of breast cancer in first degree relative    Fibromyalgia    GERD (gastroesophageal reflux disease)    Hyperglycemia 03/05/2017   Hyperlipidemia 08/21/2016   no meds   Hyperparathyroidism    Hypertension    controlled by medications   Joint pain    Neuromuscular disorder (HCC)    Fibromyalgia   Obesity    Osteoarthritis    RA   PONV (postoperative nausea and vomiting)    occasional   Pre-diabetes    Sleep apnea    wears cpap   Viral meningitis    Vitamin D deficiency      Social History   Socioeconomic History   Marital status: Widowed    Spouse name: Not on file   Number of children: 1   Years of education: Not on file   Highest education level: 12th grade  Occupational History   Occupation: ACCOUNTING    Employer: [employer]  Tobacco Use   Smoking status: Never   Smokeless tobacco: Never  Vaping  Use   Vaping status: Never Used  Substance and Sexual Activity   Alcohol use: Not Currently   Drug use: No   Sexual activity: Not Currently  Other Topics Concern   Not on file  Social History Narrative   Not on file   Social Drivers of Health   Financial Resource Strain: Low Risk  (08/22/2023)   Overall Financial Resource Strain (CARDIA)    Difficulty of Paying Living Expenses: Not very hard  Food Insecurity: No Food Insecurity (08/22/2023)   Hunger Vital Sign    Worried About Running Out of Food in the Last Year: Never true    Ran Out of Food in the Last Year: Never true  Transportation Needs: No Transportation Needs (08/22/2023)   PRAPARE - Administrator, Civil Service (Medical): No    Lack of Transportation  (Non-Medical): No  Physical Activity: Inactive (08/22/2023)   Exercise Vital Sign    Days of Exercise per Week: 0 days    Minutes of Exercise per Session: 10 min  Stress: No Stress Concern Present (08/22/2023)   [employer] of Occupational Health - Occupational Stress Questionnaire    Feeling of Stress : Only a little  Social Connections: Moderately Integrated (08/22/2023)   Social Connection and Isolation Panel [NHANES]    Frequency of Communication with Friends and Family: More than three times a week    Frequency of Social Gatherings with Friends and Family: Three times a week    Attends Religious Services: More than 4 times per year    Active Member of Clubs or Organizations: Yes    Attends Banker Meetings: More than 4 times per year    Marital Status: Widowed  Intimate Partner Violence: Not At Risk (12/20/2022)   Humiliation, Afraid, Rape, and Kick questionnaire    Fear of Current or Ex-Partner: No    Emotionally Abused: No    Physically Abused: No    Sexually Abused: No    Past Surgical History:  Procedure Laterality Date   ABDOMINAL HYSTERECTOMY  1998   ADENOIDECTOMY     BACK SURGERY     09/21/21   BREAST EXCISIONAL BIOPSY Left 1993   benign   BREAST SURGERY     Tran flap due to breast cancer   CESAREAN SECTION  1988   COLONOSCOPY  08/14/2011   HAND SURGERY     dog bite, right hand   HAND SURGERY     trauma, left hand   KNEE ARTHROSCOPY     bilateral   left knee replacement  2010   LUMBAR WOUND DEBRIDEMENT N/A 10/07/2021   Procedure: LUMBAR WOUND DEBRIDEMENT/REVISION;  Surgeon: Coletta Memos, MD;  Location: [facility] OR;  Service: Neurosurgery;  Laterality: N/A;  3C/RM 21 to follow Dr Conchita Paris   MASTECTOMY  01/13/1994   Right breast   parathyroid resection     PARATHYROIDECTOMY     POLYPECTOMY     rectal abscess     SEPTOPLASTY  1980   SHOULDER ARTHROSCOPY Right 11/09/2015   Procedure: ARTHROSCOPY SHOULDER-acromioplasty, distal clavicle resection  and debridement;  Surgeon: Marcene Corning, MD;  Location: [facility];  Service: Orthopedics;  Laterality: Right;   shoulder arthroscopy     rotator cuff repair   TOE SURGERY Right    paronychia and adenoma removed   TONSILLECTOMY     TOTAL KNEE ARTHROPLASTY  06/18/2008   [facility]   TUMOR REMOVAL  1982   , scalp    Family History  Problem Relation  Age of Onset   Emphysema Father    Lung cancer Father        lung ca dx 67   Other Father        prostate issues   Diabetes Father    Breast cancer Maternal Aunt        dx late 59s   Colon cancer Maternal Aunt        dx 46s   Colon polyps Maternal Aunt    Prostate cancer Maternal Grandfather 44       d. 85y   Heart disease Paternal Grandfather    Heart attack Paternal Grandfather        d. 50y   Diabetes Paternal Grandfather    Asthma Daughter        "seasonal"   Arthritis Maternal Grandmother    Aneurysm Maternal Grandmother        d. brain aneurysm at 30   Arthritis Mother    Colon polyps Mother    Hypertension Mother    Sleep apnea Mother    Multiple sclerosis Sister    Breast cancer Sister 41       L IDC and DCIS; ER/PR+, Her2-   Fibrocystic breast disease Sister    Arthritis/Rheumatoid Sister    Breast cancer Sister    Cancer Maternal Uncle        d. mouth cancer at younger age; smoker   Other Paternal Uncle        muscle issues - couldn't walk or talk; d. 20y   Cancer Maternal Aunt        lymphoma, dx 71s   Ovarian cancer Maternal Aunt        dx 36s; d. late 18s   Lung cancer Maternal Uncle        d. 52y; former smoker   Throat cancer Cousin        maternal 1st cousin; smoker   Breast cancer Cousin 10       maternal 1st cousin   Other Paternal Uncle        prostate issues   Breast cancer Cousin        paternal 1st cousin dx late 40s   Throat cancer Cousin        maternal 1st cousin; used SL tobacco   Prostate cancer Cousin        maternal 1st cousin dx 68s   Esophageal cancer Neg Hx    Rectal cancer Neg Hx     Stomach cancer Neg Hx     Allergies  Allergen Reactions   Allopurinol Hives   Mucinex [Guaifenesin Er] Shortness Of Breath   Lyrica [Pregabalin] Swelling   Ozempic (0.25 Or 0.5 Mg-Dose) [Semaglutide(0.25 Or 0.5mg -Dos)] Other (See Comments)    Abdominal pain   Penicillins Hives and Other (See Comments)    Has patient had a PCN reaction causing immediate rash, facial/tongue/throat swelling, SOB or lightheadedness with hypotension: no Has patient had a PCN reaction causing severe rash involving mucus membranes or skin necrosis: no Has patient had a PCN reaction that required hospitalization no Has patient had a PCN reaction occurring within the last 10 years: no If all of the above answers are "NO", then may proceed with Cephalosporin use.    Prozac [Fluoxetine Hcl] Hives   Amitriptyline Other (See Comments)    "felt weird, fatigue, dizziness"   Bupropion Other (See Comments)    Uncontrolled movements of tongue, hand   Lexapro [Escitalopram Oxalate]     Nausea and  hypersalivation.    Mounjaro [Tirzepatide]     Current Outpatient Medications on File Prior to Visit  Medication Sig Dispense Refill   acetaminophen (TYLENOL) 650 MG CR tablet Take 1,300 mg by mouth every 8 (eight) hours as needed for pain.     albuterol (VENTOLIN HFA) 108 (90 Base) MCG/ACT inhaler Inhale 2 puffs into the lungs every 6 (six) hours as needed for wheezing or shortness of breath. 8 g 0   busPIRone (BUSPAR) 5 MG tablet Take 1 tablet (5 mg total) by mouth 2 (two) times daily. 60 tablet 2   calcium carbonate (OSCAL) 1500 (600 Ca) MG TABS tablet Take 1,500 mg by mouth 2 (two) times daily with a meal.     cholecalciferol (VITAMIN D3) 25 MCG (1000 UNIT) tablet Take 1,000 Units by mouth daily.     colchicine 0.6 MG tablet Take 1 tablet (0.6 mg total) by mouth daily. 2 tabs po once and then 1 tab every 2 hours up to max of 6 tabs in 24 hours prn gout 30 tablet 3   Cyanocobalamin (B-12) 1000 MCG CAPS Take 1 capsule  by mouth daily at 6 (six) AM.     empagliflozin (JARDIANCE) 10 MG TABS tablet Take 1 tablet (10 mg total) by mouth daily before breakfast. (Patient not taking: Reported on 08/22/2023) 30 tablet 3   famotidine (PEPCID) 40 MG tablet Take 1 tablet (40 mg total) by mouth daily. 30 tablet 5   furosemide (LASIX) 40 MG tablet TAKE 1 TABLET BY MOUTH  TWICE DAILY (Patient taking differently: Take 40 mg by mouth daily.) 180 tablet 3   hydrochlorothiazide (MICROZIDE) 12.5 MG capsule Take 1 capsule (12.5 mg total) by mouth daily. 90 capsule 3   metFORMIN (GLUCOPHAGE) 500 MG tablet TAKE 1 TABLET BY MOUTH TWICE  DAILY WITH A MEAL 200 tablet 2   Multiple Vitamins-Minerals (MULTIVITAMIN & MINERAL PO) Take 1 tablet by mouth daily.     telmisartan (MICARDIS) 20 MG tablet TAKE 1 TABLET BY MOUTH DAILY 100 tablet 0   No current facility-administered medications on file prior to visit.    BP 124/60   Pulse 78   Temp 98.3 F (36.8 C)   Resp 18   Ht 5\' 6"  (1.676 m)   Wt 284 lb (128.8 kg)   SpO2 98%   BMI 45.84 kg/m         Objective:   Physical Exam  General- No acute distress. Pleasant patient. Neck- Full range of motion, no jvd Lungs- Clear, even and unlabored. Heart- regular rate and rhythm. Neurologic- CNII- XII grossly intact.  Skin- lumbar spine- 6 cm scar present. Lower bottom portion of scar 1cm x 1cm mild indurated area. No fluctuance. No redness. No dc.      Assessment & Plan:   Patient Instructions  Possible recurrent infection at surgical site(though if so then early and likely mild) Concern for recurrent infection at surgical site due to history of delayed healing and recent induration. No current abscess by exam, but precautionary measures advised. - Order CBC to assess for infection. - Prescribe doxycycline 100 mg orally twice daily for 10 days. - Advise to take doxycycline with food to prevent gastrointestinal upset. - Schedule follow-up appointment on Monday afternoon with Dr.  Abner Greenspan or current provider to reassess. - Instruct to seek emergency care if significant changes occur over the weekend.   Esperanza Richters, PA-C

## 2023-08-26 NOTE — Assessment & Plan Note (Signed)
 [id number] acceptable, minimize simple carbs. Increase exercise as tolerated. Continue current meds

## 2023-08-26 NOTE — Assessment & Plan Note (Signed)
Recently seen by Dr Elsworth Soho of pulmonogy for continuation of her CPAP. No changes

## 2023-08-26 NOTE — Assessment & Plan Note (Signed)
 Well controlled, no changes to meds. Encouraged heart healthy diet such as the DASH diet and exercise as tolerated.

## 2023-08-26 NOTE — Assessment & Plan Note (Signed)
 Increase leafy greens, consider increased lean red meat and using cast iron cookware. Continue to monitor, report any concerns

## 2023-08-26 NOTE — Assessment & Plan Note (Signed)
 Supplement and monitor

## 2023-08-26 NOTE — Assessment & Plan Note (Signed)
 Encouraged DASH or MIND diet, decrease po intake and increase exercise as tolerated. Needs 7-8 hours of sleep nightly. Avoid trans fats, eat small, frequent meals every 4-5 hours with lean proteins, complex carbs and healthy fats. Minimize simple carbs, high fat foods and processed foods

## 2023-08-26 NOTE — Assessment & Plan Note (Signed)
 Encourage heart healthy diet such as MIND or DASH diet, increase exercise, avoid trans fats, simple carbohydrates and processed foods, consider a krill or fish or flaxseed oil cap daily.

## 2023-08-26 NOTE — Assessment & Plan Note (Signed)
 Hydrate and monitor

## 2023-08-27 ENCOUNTER — Encounter: Payer: Self-pay | Admitting: Family Medicine

## 2023-08-27 ENCOUNTER — Ambulatory Visit (HOSPITAL_BASED_OUTPATIENT_CLINIC_OR_DEPARTMENT_OTHER)
Admission: RE | Admit: 2023-08-27 | Discharge: 2023-08-27 | Disposition: A | Discharge status: Home / Self Care | Source: Ambulatory Visit | Attending: Family Medicine | Admitting: Family Medicine

## 2023-08-27 ENCOUNTER — Ambulatory Visit (INDEPENDENT_AMBULATORY_CARE_PROVIDER_SITE_OTHER): Admitting: Family Medicine

## 2023-08-27 ENCOUNTER — Encounter: Payer: Self-pay | Admitting: Internal Medicine

## 2023-08-27 VITALS — BP 120/66 | HR 86 | Temp 98.7°F | Resp 18 | Ht 66.0 in | Wt 285.0 lb

## 2023-08-27 DIAGNOSIS — E1169 Type 2 diabetes mellitus with other specified complication: Secondary | ICD-10-CM | POA: Diagnosis not present

## 2023-08-27 DIAGNOSIS — G8929 Other chronic pain: Secondary | ICD-10-CM | POA: Diagnosis not present

## 2023-08-27 DIAGNOSIS — D649 Anemia, unspecified: Secondary | ICD-10-CM

## 2023-08-27 DIAGNOSIS — I10 Essential (primary) hypertension: Secondary | ICD-10-CM | POA: Diagnosis not present

## 2023-08-27 DIAGNOSIS — E559 Vitamin D deficiency, unspecified: Secondary | ICD-10-CM | POA: Diagnosis not present

## 2023-08-27 DIAGNOSIS — M47815 Spondylosis without myelopathy or radiculopathy, thoracolumbar region: Secondary | ICD-10-CM | POA: Diagnosis not present

## 2023-08-27 DIAGNOSIS — E782 Mixed hyperlipidemia: Secondary | ICD-10-CM | POA: Diagnosis not present

## 2023-08-27 DIAGNOSIS — M109 Gout, unspecified: Secondary | ICD-10-CM | POA: Diagnosis not present

## 2023-08-27 DIAGNOSIS — Z6841 Body Mass Index (BMI) 40.0 and over, adult: Secondary | ICD-10-CM

## 2023-08-27 DIAGNOSIS — M545 Low back pain, unspecified: Secondary | ICD-10-CM

## 2023-08-27 DIAGNOSIS — G4733 Obstructive sleep apnea (adult) (pediatric): Secondary | ICD-10-CM | POA: Diagnosis not present

## 2023-08-27 DIAGNOSIS — Z981 Arthrodesis status: Secondary | ICD-10-CM | POA: Diagnosis not present

## 2023-08-27 DIAGNOSIS — M47814 Spondylosis without myelopathy or radiculopathy, thoracic region: Secondary | ICD-10-CM | POA: Diagnosis not present

## 2023-08-27 LAB — CBC WITH DIFFERENTIAL/PLATELET
Basophils Absolute: 0 10*3/uL (ref 0.0–0.1)
Basophils Relative: 0.7 % (ref 0.0–3.0)
Eosinophils Absolute: 0.1 10*3/uL (ref 0.0–0.7)
Eosinophils Relative: 1.7 % (ref 0.0–5.0)
HCT: 38.5 % (ref 36.0–46.0)
Hemoglobin: 12.3 g/dL (ref 12.0–15.0)
Lymphocytes Relative: 37.4 % (ref 12.0–46.0)
Lymphs Abs: 2.5 10*3/uL (ref 0.7–4.0)
MCHC: 31.8 g/dL (ref 30.0–36.0)
MCV: 84.4 fl (ref 78.0–100.0)
Monocytes Absolute: 0.8 10*3/uL (ref 0.1–1.0)
Monocytes Relative: 11.4 % (ref 3.0–12.0)
Neutro Abs: 3.2 10*3/uL (ref 1.4–7.7)
Neutrophils Relative %: 48.8 % (ref 43.0–77.0)
Platelets: 267 10*3/uL (ref 150.0–400.0)
RBC: 4.56 Mil/uL (ref 3.87–5.11)
RDW: 18.2 % — ABNORMAL HIGH (ref 11.5–15.5)
WBC: 6.7 10*3/uL (ref 4.0–10.5)

## 2023-08-27 LAB — HIGH SENSITIVITY CRP: CRP, High Sensitivity: 3.67 mg/L (ref 0.000–5.000)

## 2023-08-27 LAB — CK: Total CK: 111 U/L (ref 7–177)

## 2023-08-27 LAB — SEDIMENTATION RATE: Sed Rate: 16 mm/h (ref 0–30)

## 2023-08-27 NOTE — Progress Notes (Signed)
 Subjective:    Patient ID: [name], female    DOB: 1955/06/27, 68 y.o.   MRN: [id number]  Chief Complaint  Patient presents with   Follow-up    HPI Discussed the use of AI scribe software for clinical note transcription with the patient, who gave verbal consent to proceed.  History of Present Illness [name] is a 68 year old female who presents with a persistent burning and pulling sensation on her back. She was seen by PA in office and started on ABX last week now in for re evaluation of a persistent back issue.  She has been experiencing a persistent burning and pulling sensation on her back for approximately two weeks. The discomfort began after she noticed an itch, scratched it, and discovered a large area on her back. The sensation is exacerbated by sitting or bending over. Initially, the area was red, which she attributed to rubbing against a seat. No associated fevers, chills, or systemic symptoms.  She has been taking doxycycline twice a day for ten days, prescribed by Dr. Ramon Dredge, but reports no significant improvement in her symptoms.  Her past medical history includes anemia, which has improved, and gout, which is managed with colchicine due to an allergy to allopurinol. She also has a history of small adenomas.  Her family history is significant for colon cancer, which influences her preference for close surveillance with colonoscopies.  She is in contact with a neurosurgeon in [place] for a different issue and is satisfied with the care. She is also working with a pharmacist named Tammy to manage her medication, specifically Jardiance.    Past Medical History:  Diagnosis Date   Allergic rhinitis    Allergy    Anemia 07/17/2016   Anxiety    Asthma    seasonal   Blood transfusion without reported diagnosis    Breast cancer (HCC)    right   Bronchitis    Colon polyps    CPAP (continuous positive airway pressure) dependence    Depression     Diabetes mellitus    borderline but takes metformin   Edema    Family history of breast cancer in first degree relative    Fibromyalgia    GERD (gastroesophageal reflux disease)    Hyperglycemia 03/05/2017   Hyperlipidemia 08/21/2016   no meds   Hyperparathyroidism    Hypertension    controlled by medications   Joint pain    Neuromuscular disorder (HCC)    Fibromyalgia   Obesity    Osteoarthritis    RA   PONV (postoperative nausea and vomiting)    occasional   Pre-diabetes    Sleep apnea    wears cpap   Viral meningitis    Vitamin D deficiency     Past Surgical History:  Procedure Laterality Date   ABDOMINAL HYSTERECTOMY  1998   ADENOIDECTOMY     BACK SURGERY     09/21/21   BREAST EXCISIONAL BIOPSY Left 1993   benign   BREAST SURGERY     Tran flap due to breast cancer   CESAREAN SECTION  1988   COLONOSCOPY  08/14/2011   HAND SURGERY     dog bite, right hand   HAND SURGERY     trauma, left hand   KNEE ARTHROSCOPY     bilateral   left knee replacement  2010   LUMBAR WOUND DEBRIDEMENT N/A 10/07/2021   Procedure: LUMBAR WOUND DEBRIDEMENT/REVISION;  Surgeon: Coletta Memos, MD;  Location: [facility] OR;  Service: Neurosurgery;  Laterality: N/A;  3C/RM 21 to follow Dr Conchita Paris   MASTECTOMY  01/13/1994   Right breast   parathyroid resection     PARATHYROIDECTOMY     POLYPECTOMY     rectal abscess     SEPTOPLASTY  1980   SHOULDER ARTHROSCOPY Right 11/09/2015   Procedure: ARTHROSCOPY SHOULDER-acromioplasty, distal clavicle resection and debridement;  Surgeon: Marcene Corning, MD;  Location: [facility] OR;  Service: Orthopedics;  Laterality: Right;   shoulder arthroscopy     rotator cuff repair   TOE SURGERY Right    paronychia and adenoma removed   TONSILLECTOMY     TOTAL KNEE ARTHROPLASTY  06/18/2008   [facility]   TUMOR REMOVAL  1982   , scalp    Family History  Problem Relation Age of Onset   Emphysema Father    Lung cancer Father        lung ca dx 33   Other Father         prostate issues   Diabetes Father    Breast cancer Maternal Aunt        dx late 75s   Colon cancer Maternal Aunt        dx 23s   Colon polyps Maternal Aunt    Prostate cancer Maternal Grandfather 62       d. 85y   Heart disease Paternal Grandfather    Heart attack Paternal Grandfather        d. 50y   Diabetes Paternal Grandfather    Asthma Daughter        "seasonal"   Arthritis Maternal Grandmother    Aneurysm Maternal Grandmother        d. brain aneurysm at 39   Arthritis Mother    Colon polyps Mother    Hypertension Mother    Sleep apnea Mother    Multiple sclerosis Sister    Breast cancer Sister 52       L IDC and DCIS; ER/PR+, Her2-   Fibrocystic breast disease Sister    Arthritis/Rheumatoid Sister    Breast cancer Sister    Cancer Maternal Uncle        d. mouth cancer at younger age; smoker   Other Paternal Uncle        muscle issues - couldn't walk or talk; d. 20y   Cancer Maternal Aunt        lymphoma, dx 67s   Ovarian cancer Maternal Aunt        dx 65s; d. late 74s   Lung cancer Maternal Uncle        d. 46y; former smoker   Throat cancer Cousin        maternal 1st cousin; smoker   Breast cancer Cousin 1       maternal 1st cousin   Other Paternal Uncle        prostate issues   Breast cancer Cousin        paternal 1st cousin dx late 9s   Throat cancer Cousin        maternal 1st cousin; used SL tobacco   Prostate cancer Cousin        maternal 1st cousin dx 36s   Esophageal cancer Neg Hx    Rectal cancer Neg Hx    Stomach cancer Neg Hx     Social History   Socioeconomic History   Marital status: Widowed    Spouse name: Not on file   Number of children: 1   Years of education:  Not on file   Highest education level: 12th grade  Occupational History   Occupation: ACCOUNTING    Employer: [employer]  Tobacco Use   Smoking status: Never   Smokeless tobacco: Never  Vaping Use   Vaping status: Never Used  Substance and Sexual Activity    Alcohol use: Not Currently   Drug use: No   Sexual activity: Not Currently  Other Topics Concern   Not on file  Social History Narrative   Not on file   Social Drivers of Health   Financial Resource Strain: Low Risk  (08/22/2023)   Overall Financial Resource Strain (CARDIA)    Difficulty of Paying Living Expenses: Not very hard  Food Insecurity: No Food Insecurity (08/22/2023)   Hunger Vital Sign    Worried About Running Out of Food in the Last Year: Never true    Ran Out of Food in the Last Year: Never true  Transportation Needs: No Transportation Needs (08/22/2023)   PRAPARE - Administrator, Civil Service (Medical): No    Lack of Transportation (Non-Medical): No  Physical Activity: Inactive (08/22/2023)   Exercise Vital Sign    Days of Exercise per Week: 0 days    Minutes of Exercise per Session: 10 min  Stress: No Stress Concern Present (08/22/2023)   [employer] of Occupational Health - Occupational Stress Questionnaire    Feeling of Stress : Only a little  Social Connections: Moderately Integrated (08/22/2023)   Social Connection and Isolation Panel [NHANES]    Frequency of Communication with Friends and Family: More than three times a week    Frequency of Social Gatherings with Friends and Family: Three times a week    Attends Religious Services: More than 4 times per year    Active Member of Clubs or Organizations: Yes    Attends Banker Meetings: More than 4 times per year    Marital Status: Widowed  Intimate Partner Violence: Not At Risk (12/20/2022)   Humiliation, Afraid, Rape, and Kick questionnaire    Fear of Current or Ex-Partner: No    Emotionally Abused: No    Physically Abused: No    Sexually Abused: No    Outpatient Medications Prior to Visit  Medication Sig Dispense Refill   acetaminophen (TYLENOL) 650 MG CR tablet Take 1,300 mg by mouth every 8 (eight) hours as needed for pain.     albuterol (VENTOLIN HFA) 108 (90 Base) MCG/ACT  inhaler Inhale 2 puffs into the lungs every 6 (six) hours as needed for wheezing or shortness of breath. 8 g 0   busPIRone (BUSPAR) 5 MG tablet Take 1 tablet (5 mg total) by mouth 2 (two) times daily. 60 tablet 2   calcium carbonate (OSCAL) 1500 (600 Ca) MG TABS tablet Take 1,500 mg by mouth 2 (two) times daily with a meal.     cholecalciferol (VITAMIN D3) 25 MCG (1000 UNIT) tablet Take 1,000 Units by mouth daily.     colchicine 0.6 MG tablet Take 1 tablet (0.6 mg total) by mouth daily. 2 tabs po once and then 1 tab every 2 hours up to max of 6 tabs in 24 hours prn gout 30 tablet 3   Cyanocobalamin (B-12) 1000 MCG CAPS Take 1 capsule by mouth daily at 6 (six) AM.     doxycycline (VIBRA-TABS) 100 MG tablet Take 1 tablet (100 mg total) by mouth 2 (two) times daily. 20 tablet 0   empagliflozin (JARDIANCE) 10 MG TABS tablet Take 1  tablet (10 mg total) by mouth daily before breakfast. 30 tablet 3   famotidine (PEPCID) 40 MG tablet Take 1 tablet (40 mg total) by mouth daily. 30 tablet 5   furosemide (LASIX) 40 MG tablet TAKE 1 TABLET BY MOUTH  TWICE DAILY (Patient taking differently: Take 40 mg by mouth daily.) 180 tablet 3   hydrochlorothiazide (MICROZIDE) 12.5 MG capsule Take 1 capsule (12.5 mg total) by mouth daily. 90 capsule 3   metFORMIN (GLUCOPHAGE) 500 MG tablet TAKE 1 TABLET BY MOUTH TWICE  DAILY WITH A MEAL 200 tablet 2   Multiple Vitamins-Minerals (MULTIVITAMIN & MINERAL PO) Take 1 tablet by mouth daily.     telmisartan (MICARDIS) 20 MG tablet TAKE 1 TABLET BY MOUTH DAILY 100 tablet 0   No facility-administered medications prior to visit.    Allergies  Allergen Reactions   Allopurinol Hives   Mucinex [Guaifenesin Er] Shortness Of Breath   Lyrica [Pregabalin] Swelling   Ozempic (0.25 Or 0.5 Mg-Dose) [Semaglutide(0.25 Or 0.5mg -Dos)] Other (See Comments)    Abdominal pain   Penicillins Hives and Other (See Comments)    Has patient had a PCN reaction causing immediate rash,  facial/tongue/throat swelling, SOB or lightheadedness with hypotension: no Has patient had a PCN reaction causing severe rash involving mucus membranes or skin necrosis: no Has patient had a PCN reaction that required hospitalization no Has patient had a PCN reaction occurring within the last 10 years: no If all of the above answers are "NO", then may proceed with Cephalosporin use.    Prozac [Fluoxetine Hcl] Hives   Amitriptyline Other (See Comments)    "felt weird, fatigue, dizziness"   Bupropion Other (See Comments)    Uncontrolled movements of tongue, hand   Lexapro [Escitalopram Oxalate]     Nausea and hypersalivation.    Mounjaro [Tirzepatide]     Review of Systems  Constitutional:  Negative for fever and malaise/fatigue.  HENT:  Negative for congestion.   Eyes:  Negative for blurred vision.  Respiratory:  Negative for shortness of breath.   Cardiovascular:  Negative for chest pain, palpitations and leg swelling.  Gastrointestinal:  Negative for abdominal pain, blood in stool and nausea.  Genitourinary:  Negative for dysuria and frequency.  Musculoskeletal:  Positive for back pain. Negative for falls.  Skin:  Positive for rash.  Neurological:  Negative for dizziness, loss of consciousness and headaches.  Endo/Heme/Allergies:  Negative for environmental allergies.  Psychiatric/Behavioral:  Negative for depression. The patient is not nervous/anxious.        Objective:    Physical Exam Constitutional:      General: She is not in acute distress.    Appearance: Normal appearance. She is well-developed. She is not toxic-appearing.  HENT:     Head: Normocephalic and atraumatic.     Right Ear: External ear normal.     Left Ear: External ear normal.     Nose: Nose normal.  Eyes:     General:        Right eye: No discharge.        Left eye: No discharge.     Conjunctiva/sclera: Conjunctivae normal.  Neck:     Thyroid: No thyromegaly.  Cardiovascular:     Rate and  Rhythm: Normal rate and regular rhythm.     Heart sounds: Normal heart sounds. No murmur heard. Pulmonary:     Effort: Pulmonary effort is normal. No respiratory distress.     Breath sounds: Normal breath sounds.  Abdominal:  General: Bowel sounds are normal.     Palpations: Abdomen is soft.     Tenderness: There is no abdominal tenderness. There is no guarding.  Musculoskeletal:        General: Normal range of motion.     Cervical back: Neck supple.  Lymphadenopathy:     Cervical: No cervical adenopathy.  Skin:    General: Skin is warm and dry.     Findings: No erythema.     Comments: Mild erythema at base of scar in lumbar region over spine. No fluctuance or blisters. No pustules  Neurological:     Mental Status: She is alert and oriented to person, place, and time.  Psychiatric:        Mood and Affect: Mood normal.        Behavior: Behavior normal.        Thought Content: Thought content normal.        Judgment: Judgment normal.     BP 120/66 (BP Location: Left Arm, Patient Position: Sitting, Cuff Size: Large)   Pulse 86   Temp 98.7 F (37.1 C) (Oral)   Resp 18   Ht 5\' 6"  (1.676 m)   Wt 285 lb (129.3 kg)   SpO2 97%   BMI 46.00 kg/m  Wt Readings from Last 3 Encounters:  08/27/23 285 lb (129.3 kg)  08/23/23 284 lb (128.8 kg)  08/09/23 285 lb 12.8 oz (129.6 kg)    Diabetic Foot Exam - Simple   No data filed    Lab Results  Component Value Date   WBC 7.7 08/23/2023   HGB 12.5 08/23/2023   HCT 38.4 08/23/2023   PLT 273.0 08/23/2023   GLUCOSE 125 (H) 08/09/2023   CHOL 191 08/09/2023   TRIG 135.0 08/09/2023   HDL 50.30 08/09/2023   LDLDIRECT 110.0 08/21/2016   LDLCALC 114 (H) 08/09/2023   ALT 25 08/09/2023   AST 24 08/09/2023   NA 139 08/09/2023   K 3.9 08/09/2023   CL 99 08/09/2023   CREATININE 0.87 08/09/2023   BUN 15 08/09/2023   CO2 30 08/09/2023   TSH 0.77 08/09/2023   INR 2.0 (H) 06/21/2008   HGBA1C 6.6 (H) 08/09/2023   MICROALBUR <0.7  02/26/2023    Lab Results  Component Value Date   TSH 0.77 08/09/2023   Lab Results  Component Value Date   WBC 7.7 08/23/2023   HGB 12.5 08/23/2023   HCT 38.4 08/23/2023   MCV 83.2 08/23/2023   PLT 273.0 08/23/2023   Lab Results  Component Value Date   NA 139 08/09/2023   K 3.9 08/09/2023   CO2 30 08/09/2023   GLUCOSE 125 (H) 08/09/2023   BUN 15 08/09/2023   CREATININE 0.87 08/09/2023   BILITOT 0.5 08/09/2023   ALKPHOS 74 08/09/2023   AST 24 08/09/2023   ALT 25 08/09/2023   PROT 7.0 08/09/2023   ALBUMIN 4.2 08/09/2023   CALCIUM 9.5 08/09/2023   ANIONGAP 8 03/19/2023   GFR 68.16 08/09/2023   Lab Results  Component Value Date   CHOL 191 08/09/2023   Lab Results  Component Value Date   HDL 50.30 08/09/2023   Lab Results  Component Value Date   LDLCALC 114 (H) 08/09/2023   Lab Results  Component Value Date   TRIG 135.0 08/09/2023   Lab Results  Component Value Date   CHOLHDL 4 08/09/2023   Lab Results  Component Value Date   HGBA1C 6.6 (H) 08/09/2023       Assessment &  Plan:  Chronic low back pain without sciatica, unspecified back pain laterality -     DG Lumbar Spine 2-3 Views; Future -     CBC with Differential/Platelet -     Sedimentation rate -     CK -     High sensitivity CRP  Anemia, unspecified type Assessment & Plan: Increase leafy greens, consider increased lean red meat and using cast iron cookware. Continue to monitor, report any concerns    Essential hypertension Assessment & Plan: Well controlled, no changes to meds. Encouraged heart healthy diet such as the DASH diet and exercise as tolerated.     Gout of big toe Assessment & Plan: Hydrate and monitor    Mixed hyperlipidemia Assessment & Plan: Encourage heart healthy diet such as MIND or DASH diet, increase exercise, avoid trans fats, simple carbohydrates and processed foods, consider a krill or fish or flaxseed oil cap daily.    OSA (obstructive sleep  apnea) Assessment & Plan: Recently seen by Dr Vassie Loll of pulmonogy for continuation of her CPAP. No changes   Type 2 diabetes mellitus with obesity (HCC) Assessment & Plan: hgba1c acceptable, minimize simple carbs. Increase exercise as tolerated. Continue current meds    Vitamin D deficiency Assessment & Plan: Supplement and monitor    Morbid obesity (HCC) Assessment & Plan: Encouraged DASH or MIND diet, decrease po intake and increase exercise as tolerated. Needs 7-8 hours of sleep nightly. Avoid trans fats, eat small, frequent meals every 4-5 hours with lean proteins, complex carbs and healthy fats. Minimize simple carbs, high fat foods and processed foods      Assessment and Plan Assessment & Plan Back lesion A back lesion, present for two weeks, initially itchy, now painful, burning, and pulling, especially when sitting or bending. Initially red, it has not increased in size. Differential diagnosis includes skin infection, viral infection, or scar tissue. Doxycycline for 10 days showed no significant improvement. Previous CBC was normal. Further evaluation with repeat CBC, sed rate, and CRP is planned to assess for inflammation or infection. An x-ray is planned to check for fluid pockets or abnormalities. CT scan considered if symptoms persist or worsen. Risks of further imaging include radiation exposure, but necessary if the lesion worsens or does not resolve. - Order repeat CBC, sed rate, and CRP. - Order x-ray of the lower back. - Continue doxycycline for the full 10-day course. - Consider CT scan if symptoms persist or worsen.  Gout Gout is well-managed with colchicine. Allergic to allopurinol, limiting treatment options. Previous elevated uric acid levels, but normal white blood cell count indicates no secondary infection. - Continue colchicine for gout management.  Anemia Previously mildly anemic in mid-March. Elevated RDW indicates new red blood cell production. Improved  hemoglobin levels suggest recovery from anemia.  General Health Maintenance History of skin lesions, some precancerous. Advised regular dermatology visits, especially after age 15. History of adenomatous polyps, due for colonoscopy. Regular eye examinations. - Refer to dermatologist for evaluation of skin tags and other lesions. - Advise annual dermatology visits for skin cancer screening. - Contact Dr. Leone Payor for colonoscopy scheduling. - Obtain records from [facility] for recent eye examination.  Follow-up Follow-up appointments on May 27 and September 4. Lab visit on April 9 with pharmacist for [facility] paperwork. Virtual visit in four weeks to assess back lesion progress. - Schedule virtual visit in four weeks for back lesion assessment. - Maintain follow-up appointments on May 27 and September 4. - Attend lab  visit on April 9 with pharmacist.     Danise Edge, MD

## 2023-08-27 NOTE — Patient Instructions (Signed)

## 2023-09-05 ENCOUNTER — Ambulatory Visit (INDEPENDENT_AMBULATORY_CARE_PROVIDER_SITE_OTHER): Admitting: Pharmacist

## 2023-09-05 DIAGNOSIS — E782 Mixed hyperlipidemia: Secondary | ICD-10-CM

## 2023-09-05 DIAGNOSIS — E1169 Type 2 diabetes mellitus with other specified complication: Secondary | ICD-10-CM

## 2023-09-05 DIAGNOSIS — E669 Obesity, unspecified: Secondary | ICD-10-CM

## 2023-09-05 NOTE — Progress Notes (Signed)
 09/05/2023 Name: [name] MRN: [id number] DOB: 01-27-1956  Chief Complaint  Patient presents with   Medication Management   Diabetes    [name] is a 68 y.o. year old female who presented for a telephone visit.   They were referred to the pharmacist by their PCP for assistance in managing medication access.    Subjective:    Medication Access/Adherence  Current Pharmacy:  [facility] [address] [place], [place] - [address] [place] [address] Phone: [phone] Fax: [phone]  [facility] - [place], [place] - [address] [place]  [address] Phone: [phone] Fax: [phone]  [facility] [address] [place] [address] Phone: [phone] Fax: [phone]   Patient reports affordability concerns with their medications: Yes  Patient reports access/transportation concerns to their pharmacy: No  Patient reports adherence concerns with their medications:  No      Diabetes:  Current medications: metformin 500mg  twice a day with meals Medications tried in the past: ozempic - caused abdominal pain and burning  Dr Abner Greenspan has prescribed Jardiance 10mg  daily. Patient would like to stop metformin however she has not been able to afford Jardiance - has $255 deductible and then cost will be $47 per month.  At last visit sent patient application for Jardiance patient assistance program. She returns application today.   Hyperlipidemia/ASCVD Risk Reduction  Current lipid lowering medications: none currently Medications tried in the past: atorvastatin - 10mg  twice a week - took in 2022. Noted that it was stopped due ot patient preference but [name] does not remember taking atorvastatin or any medications for her cholesterol before.    Objective:  Lab Results  Component Value Date    HGBA1C 6.6 (H) 08/09/2023    Lab Results  Component Value Date   CREATININE 0.87 08/09/2023   BUN 15 08/09/2023   NA 139 08/09/2023   K 3.9 08/09/2023   CL 99 08/09/2023   CO2 30 08/09/2023    Lab Results  Component Value Date   CHOL 191 08/09/2023   HDL 50.30 08/09/2023   LDLCALC 114 (H) 08/09/2023   LDLDIRECT 110.0 08/21/2016   TRIG 135.0 08/09/2023   CHOLHDL 4 08/09/2023    Medications Reviewed Today     Reviewed by Henrene Pastor, RPH-CPP (Pharmacist) on 09/05/23 at 1428  Med List Status: <None>   Medication Order Taking? Sig Documenting Provider Last Dose Status Informant  acetaminophen (TYLENOL) 650 MG CR tablet [id number]  Take 1,300 mg by mouth every 8 (eight) hours as needed for pain. [provider]  Active Self, Pharmacy Records  albuterol (VENTOLIN HFA) 108 (90 Base) MCG/ACT inhaler [id number]  Inhale 2 puffs into the lungs every 6 (six) hours as needed for wheezing or shortness of breath. Sandford Craze, NP  Active   busPIRone (BUSPAR) 5 MG tablet [id number]  Take 1 tablet (5 mg total) by mouth 2 (two) times daily. Bradd Canary, MD  Active   calcium carbonate (OSCAL) 1500 (600 Ca) MG TABS tablet [id number]  Take 1,500 mg by mouth 2 (two) times daily with a meal. [provider]  Active Self, Pharmacy Records  cholecalciferol (VITAMIN D3) 25 MCG (1000 UNIT) tablet [id number]  Take 1,000 Units by mouth daily. [provider]  Active Self, Pharmacy Records  colchicine 0.6 MG tablet [id number]  Take 1 tablet (0.6 mg  total) by mouth daily. 2 tabs po once and then 1 tab every 2 hours up to max of 6 tabs in 24 hours prn gout Bradd Canary, MD  Active   Cyanocobalamin (B-12) 1000 MCG CAPS [id number]  Take 1 capsule by mouth daily at 6 (six) AM. Sandford Craze, NP  Active Self, Pharmacy Records  empagliflozin (JARDIANCE) 10 MG TABS tablet [id number]  Take 1 tablet (10 mg total) by mouth daily before breakfast. Bradd Canary, MD  Active    famotidine (PEPCID) 40 MG tablet [id number]  Take 1 tablet (40 mg total) by mouth daily. Bradd Canary, MD  Active Self, Pharmacy Records  furosemide (LASIX) 40 MG tablet [id number] Yes TAKE 1 TABLET BY MOUTH  TWICE DAILY  Patient taking differently: Take 40 mg by mouth daily.   Bradd Canary, MD Taking Active Self, Pharmacy Records  hydrochlorothiazide (MICROZIDE) 12.5 MG capsule [id number] Yes Take 1 capsule (12.5 mg total) by mouth daily. Bradd Canary, MD Taking Active   metFORMIN (GLUCOPHAGE) 500 MG tablet [id number] No TAKE 1 TABLET BY MOUTH TWICE  DAILY WITH A MEAL  Patient not taking: Reported on 09/05/2023   Bradd Canary, MD Not Taking Active   Multiple Vitamins-Minerals (MULTIVITAMIN & MINERAL PO) [id number] Yes Take 1 tablet by mouth daily. [provider] Taking Active Self, Pharmacy Records  telmisartan (MICARDIS) 20 MG tablet [id number] Yes TAKE 1 TABLET BY MOUTH DAILY Bradd Canary, MD Taking Active               Assessment/Plan:   Diabetes: Last A1c was at goal of < 7.0 but patient would like to switch from metformin to Jardiance due to potential metformin side effects and because Jardiance has additional CKD and cardiovascular disease benefits. - Recommend to continue metformin for now.  - Meets financial criteria for Jardiance or [place] patient assistance program through [facility] or [place] and [place]. Patient has completed per portion of application. I completed provider portion and forwarded to Dr Abner Greenspan to review and sign if appropriate.  Hyperlipidemia/ASCVD Risk Reduction: LDL goal < 100 - not at goal currently - Discussed recent lipids panel. Patient is agreeable to starting statin therapy.  - Will check with PCP about starting rosuvastatin 5mg  take 1 tablet daily   Follow Up Plan: 2 weeks  Henrene Pastor, PharmD Clinical Pharmacist [facility]

## 2023-09-06 DIAGNOSIS — Z7189 Other specified counseling: Secondary | ICD-10-CM | POA: Diagnosis not present

## 2023-09-06 DIAGNOSIS — D229 Melanocytic nevi, unspecified: Secondary | ICD-10-CM | POA: Diagnosis not present

## 2023-09-06 DIAGNOSIS — L738 Other specified follicular disorders: Secondary | ICD-10-CM | POA: Diagnosis not present

## 2023-09-06 DIAGNOSIS — L814 Other melanin hyperpigmentation: Secondary | ICD-10-CM | POA: Diagnosis not present

## 2023-09-06 DIAGNOSIS — L821 Other seborrheic keratosis: Secondary | ICD-10-CM | POA: Diagnosis not present

## 2023-09-06 DIAGNOSIS — D492 Neoplasm of unspecified behavior of bone, soft tissue, and skin: Secondary | ICD-10-CM | POA: Diagnosis not present

## 2023-09-20 ENCOUNTER — Other Ambulatory Visit: Payer: Self-pay | Admitting: Pharmacist

## 2023-09-20 DIAGNOSIS — E782 Mixed hyperlipidemia: Secondary | ICD-10-CM

## 2023-09-20 DIAGNOSIS — E669 Obesity, unspecified: Secondary | ICD-10-CM

## 2023-09-20 MED ORDER — ROSUVASTATIN CALCIUM 5 MG PO TABS: 5.0000 mg | ORAL_TABLET | Freq: Every day | ORAL | 3 refills | Status: DC

## 2023-09-20 NOTE — Progress Notes (Cosign Needed Addendum)
 09/20/2023 Name: [name] MRN: [id number] DOB: 11-20-1955  Chief Complaint  Patient presents with   Medication Management    [name] is a 68 y.o. year old female .Spoke with medication assistance program [employer] for Jardiance  today. Also spoke to patient about last cholesterol panel.   They were referred to the pharmacist by their PCP for assistance in managing medication access.   Subjective:  Medication Access/Adherence  Current Pharmacy:  [facility] [address] [place], [place] - [address] [place] [address] Phone: [phone] Fax: [phone]  [facility] - [place] - [address] [place] [address] Phone: [phone] Fax: [phone]  [facility] [address] [place] [address] Phone: [phone] Fax: [phone]   Patient reports affordability concerns with their medications: Yes  Patient reports access/transportation concerns to their pharmacy: No  Patient reports adherence concerns with their medications:  No      Diabetes:  Current medications: metformin  500mg  twice a day with meals Medications tried in the past: ozempic  - caused abdominal pain and burning  Dr Rodrick Clapper has prescribed Jardiance  10mg  daily. Patient would like to stop metformin  however she has not been able to afford Jardiance  - has $255 deductible and then cost will be $47 per month.  At last visit sent patient application for Jardiance  patient assistance program. She returned application 2 weeks ago and it has been faxed to [facility] but we have not received a response yet.   Hyperlipidemia/ASCVD Risk Reduction  Current lipid lowering medications: none currently Medications tried in the past: atorvastatin  - 10mg  twice a week - took in 2022. Noted that it was stopped due to patient preference but  [name] does not remember taking atorvastatin  or any medications for her cholesterol before.    Objective:  Lab Results  Component Value Date   HGBA1C 6.6 (H) 08/09/2023    Lab Results  Component Value Date   CREATININE 0.87 08/09/2023   BUN 15 08/09/2023   NA 139 08/09/2023   K 3.9 08/09/2023   CL 99 08/09/2023   CO2 30 08/09/2023    Lab Results  Component Value Date   CHOL 191 08/09/2023   HDL 50.30 08/09/2023   LDLCALC 114 (H) 08/09/2023   LDLDIRECT 110.0 08/21/2016   TRIG 135.0 08/09/2023   CHOLHDL 4 08/09/2023    Medications Reviewed Today   Medications were not reviewed in this encounter       Assessment/Plan:   Diabetes: Last A1c was at goal of < 7.0 but patient would like to switch from metformin  to Jardiance  due to potential metformin  side effects and because Jardiance  has additional CKD and cardiovascular disease benefits. - Recommend to continue metformin  for now.  - Meets financial criteria for Jardiance  or [place] patient assistance program through [facility] or [place] [facility] [place].  - Called [employer] to check on Jardiance  application - they are requesting additional income verification from patient. Requested patient bring to office either copy of her Social Security income letter or first 2 pages of her 2025 tax return.   Hyperlipidemia/ASCVD Risk Reduction: LDL goal < 100 - not at goal currently - Patient is agreeable to starting statin therapy. Start rosuvastatin  5mg  daily    Follow Up Plan: 2 to 4 weeks.   [name], PharmD Clinical Pharmacist [facility]

## 2023-09-24 ENCOUNTER — Ambulatory Visit: Admitting: Family Medicine

## 2023-09-25 ENCOUNTER — Ambulatory Visit (AMBULATORY_SURGERY_CENTER)

## 2023-09-25 ENCOUNTER — Other Ambulatory Visit: Payer: Self-pay | Admitting: Obstetrics and Gynecology

## 2023-09-25 ENCOUNTER — Encounter: Payer: Self-pay | Admitting: Family Medicine

## 2023-09-25 VITALS — Ht 66.0 in | Wt 275.0 lb

## 2023-09-25 DIAGNOSIS — Z1231 Encounter for screening mammogram for malignant neoplasm of breast: Secondary | ICD-10-CM

## 2023-09-25 DIAGNOSIS — Z860100 Personal history of colon polyps, unspecified: Secondary | ICD-10-CM

## 2023-09-25 NOTE — Progress Notes (Signed)

## 2023-09-25 NOTE — Telephone Encounter (Signed)
 Spoke to patient and she stated she will reach out to the cardiology office that cancelled her appointment.

## 2023-09-28 ENCOUNTER — Ambulatory Visit: Admitting: Cardiology

## 2023-09-28 ENCOUNTER — Encounter: Payer: Self-pay | Admitting: Internal Medicine

## 2023-10-01 DIAGNOSIS — M1711 Unilateral primary osteoarthritis, right knee: Secondary | ICD-10-CM | POA: Diagnosis not present

## 2023-10-03 ENCOUNTER — Ambulatory Visit: Attending: Cardiology | Admitting: Cardiology

## 2023-10-03 ENCOUNTER — Other Ambulatory Visit: Payer: Self-pay

## 2023-10-03 VITALS — BP 120/64 | HR 90 | Ht 66.0 in | Wt 285.0 lb

## 2023-10-03 DIAGNOSIS — K219 Gastro-esophageal reflux disease without esophagitis: Secondary | ICD-10-CM | POA: Insufficient documentation

## 2023-10-03 DIAGNOSIS — Z9889 Other specified postprocedural states: Secondary | ICD-10-CM | POA: Insufficient documentation

## 2023-10-03 DIAGNOSIS — R079 Chest pain, unspecified: Secondary | ICD-10-CM

## 2023-10-03 DIAGNOSIS — E669 Obesity, unspecified: Secondary | ICD-10-CM | POA: Insufficient documentation

## 2023-10-03 DIAGNOSIS — I10 Essential (primary) hypertension: Secondary | ICD-10-CM | POA: Insufficient documentation

## 2023-10-03 DIAGNOSIS — A879 Viral meningitis, unspecified: Secondary | ICD-10-CM | POA: Insufficient documentation

## 2023-10-03 DIAGNOSIS — G4733 Obstructive sleep apnea (adult) (pediatric): Secondary | ICD-10-CM

## 2023-10-03 DIAGNOSIS — T7840XA Allergy, unspecified, initial encounter: Secondary | ICD-10-CM | POA: Insufficient documentation

## 2023-10-03 DIAGNOSIS — G709 Myoneural disorder, unspecified: Secondary | ICD-10-CM | POA: Insufficient documentation

## 2023-10-03 DIAGNOSIS — F419 Anxiety disorder, unspecified: Secondary | ICD-10-CM | POA: Insufficient documentation

## 2023-10-03 DIAGNOSIS — R112 Nausea with vomiting, unspecified: Secondary | ICD-10-CM | POA: Insufficient documentation

## 2023-10-03 DIAGNOSIS — Z9989 Dependence on other enabling machines and devices: Secondary | ICD-10-CM | POA: Insufficient documentation

## 2023-10-03 DIAGNOSIS — R072 Precordial pain: Secondary | ICD-10-CM

## 2023-10-03 DIAGNOSIS — M2550 Pain in unspecified joint: Secondary | ICD-10-CM | POA: Insufficient documentation

## 2023-10-03 DIAGNOSIS — R7303 Prediabetes: Secondary | ICD-10-CM | POA: Insufficient documentation

## 2023-10-03 DIAGNOSIS — R0609 Other forms of dyspnea: Secondary | ICD-10-CM | POA: Insufficient documentation

## 2023-10-03 DIAGNOSIS — C50919 Malignant neoplasm of unspecified site of unspecified female breast: Secondary | ICD-10-CM | POA: Insufficient documentation

## 2023-10-03 DIAGNOSIS — Z5189 Encounter for other specified aftercare: Secondary | ICD-10-CM | POA: Insufficient documentation

## 2023-10-03 DIAGNOSIS — E782 Mixed hyperlipidemia: Secondary | ICD-10-CM

## 2023-10-03 DIAGNOSIS — Z803 Family history of malignant neoplasm of breast: Secondary | ICD-10-CM | POA: Insufficient documentation

## 2023-10-03 DIAGNOSIS — F32A Depression, unspecified: Secondary | ICD-10-CM | POA: Insufficient documentation

## 2023-10-03 DIAGNOSIS — K635 Polyp of colon: Secondary | ICD-10-CM | POA: Insufficient documentation

## 2023-10-03 DIAGNOSIS — J45909 Unspecified asthma, uncomplicated: Secondary | ICD-10-CM | POA: Insufficient documentation

## 2023-10-03 MED ORDER — METOPROLOL TARTRATE 100 MG PO TABS: 100.0000 mg | ORAL_TABLET | Freq: Once | ORAL | 0 refills | Status: DC

## 2023-10-03 MED ORDER — ASPIRIN 81 MG PO TBEC: 81.0000 mg | DELAYED_RELEASE_TABLET | Freq: Every day | ORAL | 3 refills | Status: DC

## 2023-10-03 MED ORDER — NITROGLYCERIN 0.4 MG SL SUBL: 0.4000 mg | SUBLINGUAL_TABLET | SUBLINGUAL | 1 refills | Status: DC | PRN

## 2023-10-03 NOTE — Progress Notes (Signed)
 Cardiology Office Note:    Date:  10/03/2023   ID:  [name], DOB 1956-03-27, MRN [id number]  PCP:  Holly Balk, MD  Cardiologist:  Holly Balzarine, MD   Referring MD: Holly Balk, MD    ASSESSMENT:    1. Mixed hyperlipidemia   2. Essential hypertension   3. OSA (obstructive sleep apnea)   4. Morbid obesity (HCC)   5. Dyspnea on exertion   6. Precordial pain   7. Chest pain of uncertain etiology    PLAN:    In order of problems listed above:  Primary prevention stressed with the patient.  Importance of compliance with diet medication stressed and patient verbalized standing. Chest pain and dyspnea on exertion: Patient has multiple risk factors for coronary artery disease.  Her symptoms are concerning and I discussed multiple modalities of evaluation.  She prefers CT coronary angiography.  It will also help risk stratify her in terms of lipid-lowering and such issues.  Will set her up for this.  In the interim till the test is done she was advised to take a coated baby aspirin on a daily basis.  Sublingual nitroglycerin prescription was sent, its protocol and 911 protocol explained and the patient vocalized understanding questions were answered to the patient's satisfaction Essential hypertension: Blood pressure is stable and diet was emphasized. Mixed dyslipidemia: Diet was emphasized.  She has been initiated on medications recently by primary care.  Goal LDL less than 60 in view of diabetes. Cardiac murmur: Echocardiogram will be done to assess murmur heard on auscultation. Diabetes mellitus and morbid obesity: Weight reduction stressed diet emphasized and she promises to do better.   Sleep apnea: Sleep health issues were discussed.  She is compliant with CPAP masks and recommended equipment. Patient will be seen in follow-up appointment in 6 months or earlier if the patient has any concerns.    Medication Adjustments/Labs and Tests Ordered: Current medicines  are reviewed at length with the patient today.  Concerns regarding medicines are outlined above.  Orders Placed This Encounter  Procedures   EKG 12-Lead   No orders of the defined types were placed in this encounter.    History of Present Illness:    [name] is a 68 y.o. female who is being seen today for the evaluation of chest pain at the request of Holly Balk, MD. patient is a pleasant 68 year old female.  She has past medical history of essential hypertension, mixed dyslipidemia, diabetes mellitus, morbid obesity and sleep apnea.  She mentions to me that she occasionally has chest discomfort.  It is not related to exertion.  No radiation to the neck or to the arms.  She leads a sedentary lifestyle because of orthopedic issues involving her knee.  No orthopnea or PND.  No palpitations.  At the time of my evaluation, the patient is alert awake oriented and in no distress.  Past Medical History:  Diagnosis Date   Allergic rhinitis 07/05/2007   Qualifier: Diagnosis of   By: Zane Hews         Allergic urticaria 03/27/2016   Allergy    Anemia 07/17/2016   Anxiety    Arthralgia 12/03/2016   Asthma    seasonal   Asthma with acute exacerbation 07/14/2014   ATTENTION DEFICIT DISORDER, INATTENTIVE TYPE 09/22/2009   Qualifier: Diagnosis of   By: Paulla Bossier MD, Dana Duncan      Replacing diagnoses that were inactivated after the 08/28/22 regulatory import  Baker's cyst of knee, right 02/26/2023   Blood transfusion without reported diagnosis    Breast cancer (HCC)    right   Colon polyps    CPAP (continuous positive airway pressure) dependence    DDD (degenerative disc disease), cervical 05/04/2021   Depression    Depression with anxiety 06/25/2006   Qualifier: Diagnosis of   By: Nilsa Bash         Dermographia 03/27/2016   Diverticulitis of colon without hemorrhage 09/21/2014   Ear canal dryness 08/03/2011   Edema    Educated about COVID-19 virus infection  04/19/2020   Elevated sed rate 04/03/2022   Essential hypertension 06/25/2006   Qualifier: Diagnosis of   By: Nilsa Bash         Family history of breast cancer in first degree relative    Fatigue 08/03/2011   Fibromyalgia    Gastroesophageal reflux disease without esophagitis 06/25/2006   Centricity Description: GERD  Qualifier: Diagnosis of   By: Nilsa Bash      Centricity Description: ESOPHAGEAL REFLUX  Qualifier: Diagnosis of   By: Donnice Gale MD, Warnell Haagensen testing 05/15/2016   Negative for mutations within any of 32 genes on the Custom Cancer Panel through [employer].  No variants of uncertain significance (VUSes were found).  This Custom Panel offered by [facility] [place], MD) includes sequencing and/or deletion duplication testing of the following 34 genes: APC, ATM, AXIN2, BARD1, BMPR1A, BRCA1, BRCA2, BRIP1, CDH1, CDK4, CDKN2A, CHEK2, EPCAM, F   GERD (gastroesophageal reflux disease)    Gout of big toe 03/10/2013   Allergic to Allopurinol       History of breast cancer 03/27/2016   History of colonic polyps 08/14/2011   03/2006 - diminutive adenoma removed Willy Harvest)  08/14/2011 - diminutive cecal adenoma removed  IMO SNOMED Dx Update Oct 2024     Hyperglycemia 03/05/2017   Hyperlipidemia 08/21/2016   no meds   Hypertension    controlled by medications   Insomnia 02/15/2009   Qualifier: Diagnosis of   By: Paulla Bossier MD, Dana Duncan         Joint pain    Left arm pain 01/21/2021   Low back pain 01/11/2022   Memory changes 04/19/2020   Morbid obesity (HCC) 09/21/2010   Neck pain 05/02/2021   Neuromuscular disorder (HCC)    Fibromyalgia   Obesity    OSA (obstructive sleep apnea) 04/23/2012   12/13 Mod- AHI 15/h, corrected by CPAP 8 cm     Osteoarthritis    RA   Other peripheral vertigo, unspecified ear 10/27/2019   PAC (premature atrial contraction) 09/21/2010   Palpitations 09/21/2010   Peripheral neuropathy 04/19/2020   Gabapentin  caused  edema     PONV (postoperative nausea and vomiting)    occasional   Pre-diabetes    Preventative health care 10/08/2012   Rheumatoid arthritis (HCC) 06/24/2015   History of diagnosis of RA is questionable. Not highly symptomatic at this time     Right hand pain 11/13/2022   Right knee pain 12/06/2016   RLS (restless legs syndrome) 06/07/2017   Spondylolisthesis of lumbar region 09/21/2021   TMJ arthralgia 04/19/2020   Type 2 diabetes mellitus with obesity (HCC) 12/10/2011   dx'd 11/2011     Urinary incontinence 10/27/2019   Vertigo 03/14/2018   Viral meningitis    Vitamin D  deficiency 03/11/2008   Qualifier: Diagnosis of   By: Paulla Bossier MD, Dana Duncan  Past Surgical History:  Procedure Laterality Date   ABDOMINAL HYSTERECTOMY  1998   ADENOIDECTOMY     BACK SURGERY     09/21/21   BREAST EXCISIONAL BIOPSY Left 1993   benign   BREAST SURGERY     Tran flap due to breast cancer   CESAREAN SECTION  1988   COLONOSCOPY  08/14/2011   HAND SURGERY     dog bite, right hand   HAND SURGERY     trauma, left hand   KNEE ARTHROSCOPY     bilateral   left knee replacement  2010   LUMBAR WOUND DEBRIDEMENT N/A 10/07/2021   Procedure: LUMBAR WOUND DEBRIDEMENT/REVISION;  Surgeon: Audie Bleacher, MD;  Location: [facility] OR;  Service: Neurosurgery;  Laterality: N/A;  3C/RM 21 to follow Dr Nat Badger   MASTECTOMY  01/13/1994   Right breast   parathyroid  resection     PARATHYROIDECTOMY     POLYPECTOMY     rectal abscess     SEPTOPLASTY  1980   SHOULDER ARTHROSCOPY Right 11/09/2015   Procedure: ARTHROSCOPY SHOULDER-acromioplasty, distal clavicle resection and debridement;  Surgeon: Dayne Even, MD;  Location: [facility] OR;  Service: Orthopedics;  Laterality: Right;   shoulder arthroscopy     rotator cuff repair   TOE SURGERY Right    paronychia and adenoma removed   TONSILLECTOMY     TOTAL KNEE ARTHROPLASTY  06/18/2008   D[place]orf   TUMOR REMOVAL  1982   , scalp    Current Medications: Current  Meds  Medication Sig   acetaminophen  (TYLENOL ) 650 MG CR tablet Take 1,300 mg by mouth every 8 (eight) hours as needed for pain.   albuterol  (VENTOLIN  HFA) 108 (90 Base) MCG/ACT inhaler Inhale 2 puffs into the lungs every 6 (six) hours as needed for wheezing or shortness of breath.   busPIRone  (BUSPAR ) 5 MG tablet Take 1 tablet (5 mg total) by mouth 2 (two) times daily.   calcium  carbonate (OSCAL) 1500 (600 Ca) MG TABS tablet Take 1,500 mg by mouth 2 (two) times daily with a meal.   cholecalciferol  (VITAMIN D3) 25 MCG (1000 UNIT) tablet Take 1,000 Units by mouth daily.   colchicine  0.6 MG tablet Take 1 tablet (0.6 mg total) by mouth daily. 2 tabs po once and then 1 tab every 2 hours up to max of 6 tabs in 24 hours prn gout   Cyanocobalamin  (B-12) 1000 MCG CAPS Take 1 capsule by mouth daily at 6 (six) AM.   famotidine  (PEPCID ) 40 MG tablet Take 1 tablet (40 mg total) by mouth daily.   furosemide  (LASIX ) 40 MG tablet TAKE 1 TABLET BY MOUTH  TWICE DAILY (Patient taking differently: Take 40 mg by mouth daily.)   hydrochlorothiazide  (MICROZIDE ) 12.5 MG capsule Take 1 capsule (12.5 mg total) by mouth daily.   metFORMIN  (GLUCOPHAGE ) 500 MG tablet TAKE 1 TABLET BY MOUTH TWICE  DAILY WITH A MEAL (Patient taking differently: Take 500 mg by mouth daily with breakfast.)   Multiple Vitamins-Minerals (MULTIVITAMIN & MINERAL PO) Take 1 tablet by mouth daily.   rosuvastatin  (CRESTOR ) 5 MG tablet Take 1 tablet (5 mg total) by mouth daily.   telmisartan  (MICARDIS ) 20 MG tablet TAKE 1 TABLET BY MOUTH DAILY     Allergies:   Allopurinol , Fluoxetine , Mucinex  [guaifenesin  er], Penicillins, Pregabalin , Ozempic  (0.25 or 0.5 mg-dose) [semaglutide (0.25 or 0.5mg -dos)], Prozac  [fluoxetine  hcl], Amitriptyline , Bupropion , Escitalopram oxalate, and Tirzepatide    Social History   Socioeconomic History   Marital status: Widowed    Spouse name:  Not on file   Number of children: 1   Years of education: Not on file   Highest  education level: 12th grade  Occupational History   Occupation: ACCOUNTING    Employer: [employer]  Tobacco Use   Smoking status: Never   Smokeless tobacco: Never  Vaping Use   Vaping status: Never Used  Substance and Sexual Activity   Alcohol use: Not Currently   Drug use: No   Sexual activity: Not Currently  Other Topics Concern   Not on file  Social History Narrative   Not on file   Social Drivers of Health   Financial Resource Strain: Low Risk  (08/22/2023)   Overall Financial Resource Strain (CARDIA)    Difficulty of Paying Living Expenses: Not very hard  Food Insecurity: No Food Insecurity (08/22/2023)   Hunger Vital Sign    Worried About Running Out of Food in the Last Year: Never true    Ran Out of Food in the Last Year: Never true  Transportation Needs: No Transportation Needs (08/22/2023)   PRAPARE - Administrator, Civil Service (Medical): No    Lack of Transportation (Non-Medical): No  Physical Activity: Inactive (08/22/2023)   Exercise Vital Sign    Days of Exercise per Week: 0 days    Minutes of Exercise per Session: 10 min  Stress: No Stress Concern Present (08/22/2023)   [employer] of Occupational Health - Occupational Stress Questionnaire    Feeling of Stress : Only a little  Social Connections: Moderately Integrated (08/22/2023)   Social Connection and Isolation Panel [NHANES]    Frequency of Communication with Friends and Family: More than three times a week    Frequency of Social Gatherings with Friends and Family: Three times a week    Attends Religious Services: More than 4 times per year    Active Member of Clubs or Organizations: Yes    Attends Banker Meetings: More than 4 times per year    Marital Status: Widowed     Family History: The patient's family history includes Aneurysm in her maternal grandmother; Arthritis in her maternal grandmother and mother; Arthritis/Rheumatoid in her sister; Asthma in her  daughter; Breast cancer in her cousin, maternal aunt, and sister; Breast cancer (age of onset: 77) in her sister; Breast cancer (age of onset: 50) in her cousin; Cancer in her maternal aunt and maternal uncle; Colon cancer in her maternal aunt; Colon polyps in her maternal aunt and mother; Diabetes in her father and paternal grandfather; Emphysema in her father; Fibrocystic breast disease in her sister; Heart attack in her paternal grandfather; Heart disease in her paternal grandfather; Hypertension in her mother; Lung cancer in her father and maternal uncle; Multiple sclerosis in her sister; Other in her father, paternal uncle, and paternal uncle; Ovarian cancer in her maternal aunt; Prostate cancer in her cousin; Prostate cancer (age of onset: 45) in her maternal grandfather; Sleep apnea in her mother; Throat cancer in her cousin and cousin. There is no history of Esophageal cancer, Rectal cancer, or Stomach cancer.  ROS:   Please see the history of present illness.    All other systems reviewed and are negative.  EKGs/Labs/Other Studies Reviewed:    The following studies were reviewed today:  EKG Interpretation Date/Time:  Wednesday Oct 03 2023 15:38:43 EDT Ventricular Rate:  90 PR Interval:  162 QRS Duration:  144 QT Interval:  412 QTC Calculation: 504 R Axis:   56  Text Interpretation:  Normal sinus rhythm Right bundle branch block When compared with ECG of 19-Mar-2023 17:58, No significant change was found Confirmed by Hillis Lu [phone]) on 10/03/2023 3:52:45 PM     Recent Labs: 08/09/2023: ALT 25; BUN 15; Creatinine, Ser 0.87; Potassium 3.9; Sodium 139; TSH 0.77 08/27/2023: Hemoglobin 12.3; Platelets 267.0  Recent Lipid Panel    Component Value Date/Time   CHOL 191 08/09/2023 1044   CHOL 151 07/20/2020 0850   TRIG 135.0 08/09/2023 1044   HDL 50.30 08/09/2023 1044   HDL 50 07/20/2020 0850   CHOLHDL 4 08/09/2023 1044   VLDL 27.0 08/09/2023 1044   LDLCALC 114 (H) 08/09/2023  1044   LDLCALC 81 07/20/2020 0850   LDLDIRECT 110.0 08/21/2016 1356    Physical Exam:    VS:  BP 120/64   Pulse 90   Ht 5\' 6"  (1.676 m)   Wt 285 lb 0.6 oz (129.3 kg)   SpO2 96%   BMI 46.01 kg/m     Wt Readings from Last 3 Encounters:  10/03/23 285 lb 0.6 oz (129.3 kg)  09/25/23 275 lb (124.7 kg)  08/27/23 285 lb (129.3 kg)     GEN: Patient is in no acute distress HEENT: Normal NECK: No JVD; No carotid bruits LYMPHATICS: No lymphadenopathy CARDIAC: S1 S2 regular, 2/6 systolic murmur at the apex. RESPIRATORY:  Clear to auscultation without rales, wheezing or rhonchi  ABDOMEN: Soft, non-tender, non-distended MUSCULOSKELETAL:  No edema; No deformity  SKIN: Warm and dry NEUROLOGIC:  Alert and oriented x 3 PSYCHIATRIC:  Normal affect    Signed, Holly Balzarine, MD  10/03/2023 4:09 PM     [facility]

## 2023-10-03 NOTE — Patient Instructions (Addendum)
 Medication Instructions:  Your physician has recommended you make the following change in your medication:   START: Nitroglycerin 0.4 mg under the tongue every 5 minutes x 3 as needed for chest pain START: Aspirin 81 mg daily  *If you need a refill on your cardiac medications before your next appointment, please call your pharmacy*  Lab Work: Your physician recommends that you return for lab work in:   Labs today: BMP  If you have labs (blood work) drawn today and your tests are completely normal, you will receive your results only by: MyChart Message (if you have MyChart) OR A paper copy in the mail If you have any lab test that is abnormal or we need to change your treatment, we will call you to review the results.  Testing/Procedures: Your physician has requested that you have an echocardiogram. Echocardiography is a painless test that uses sound waves to create images of your heart. It provides your doctor with information about the size and shape of your heart and how well your heart's chambers and valves are working. This procedure takes approximately one hour. There are no restrictions for this procedure. Please do NOT wear cologne, perfume, aftershave, or lotions (deodorant is allowed). Please arrive 15 minutes prior to your appointment time.  Please note: We ask at that you not bring children with you during ultrasound (echo/ vascular) testing. Due to room size and safety concerns, children are not allowed in the ultrasound rooms during exams. Our front office staff cannot provide observation of children in our lobby area while testing is being conducted. An adult accompanying a patient to their appointment will only be allowed in the ultrasound room at the discretion of the ultrasound technician under special circumstances. We apologize for any inconvenience.    Your cardiac CT will be scheduled at one of the below locations:   [facility] [address] [place], [place] [address] [phone]  OR  [facility] [address] [place], [place] [address] [phone]  OR   [facility] [address] [place], [place] [address] [phone]  OR   [facility] [address] [place], [place] [address] [phone]  OR   [facility]eral[facility] Mom[facility] and V[facility] Tower [address]  [place], [place] [address] Opening September 24, 2023  If scheduled at [facility], please arrive at the [facility] and Children's Entrance (Entrance C2) of [facility] 30 minutes prior to test start time. You can use the FREE valet parking offered at entrance C (encouraged to control the heart rate for the test)  Proceed to the [facility] Radiology Department (first floor) to check-in and test prep.   All radiology patients and guests should use entrance C2 at [facility], accessed from [facility], even though the hospital's physical address listed is [address]    If scheduled at the Heart and Vascular Tower at [address], please enter the parking lot using the [address] entrance and use the FREE valet service at the patient drop-off area. Enter the buidling and check-in with registration on the main floor.  If scheduled at [facility] or [facility], please arrive 15 mins early for check-in and test prep.  There is spacious parking and easy access to the radiology department from the [facility] Heart and Vascular entrance. Please enter here and check-in with the desk attendant.  If scheduled at [facility], please arrive 30 minutes early for check-in and test prep.  Please follow these instructions carefully (unless otherwise directed):  An IV will be required for this test and Nitroglycerin will be given.  Hold all erectile dysfunction medications at least 3  days (72 hrs) prior to test. (Ie viagra, cialis, sildenafil, tadalafil, etc)   On the Night Before the Test: Be sure to Drink plenty of water . Do not consume any caffeinated/decaffeinated beverages or chocolate 12 hours prior to your test. Do not take any antihistamines 12 hours prior to your test.  On the Day of the Test: Drink plenty of water  until 1 hour prior to the test. Do not eat any food 1 hour prior to test. You may take your regular medications prior to the test.  Take metoprolol (Lopressor) two hours prior to test. If you take Furosemide /Hydrochlorothiazide  please HOLD on the morning of the test. Patients who wear a continuous glucose monitor MUST remove the device prior to scanning. FEMALES- please wear underwire-free bra if available, avoid dresses & tight clothing      After the Test: Drink plenty of water . After receiving IV contrast, you may experience a mild flushed feeling. This is normal. On occasion, you may experience a mild rash up to 24 hours after the test. This is not dangerous. If this occurs, you can take Benadryl  25 mg, Zyrtec , Claritin, or Allegra and increase your fluid intake. (Patients taking Tikosyn should avoid Benadryl , and may take Zyrtec , Claritin, or Allegra) If you experience trouble breathing, this can be serious. If it is severe call 911 IMMEDIATELY. If it is mild, please call our office.  We will call to schedule your test 2-4 weeks out understanding that some insurance companies will need an authorization prior to the service being performed.   For more information and frequently asked questions, please visit our website : [email or web address]  For non-scheduling related questions, please contact the cardiac imaging nurse navigator should you have any questions/concerns: Cardiac Imaging Nurse Navigators Direct Office Dial: [phone]   For scheduling needs, including cancellations and rescheduling, please call [place],  [phone].   Follow-Up: At [facility], you and your health needs are our priority.  As part of our continuing mission to provide you with exceptional heart care, our providers are all part of one team.  This team includes your primary Cardiologist (physician) and Advanced Practice Providers or APPs (Physician Assistants and Nurse Practitioners) who all work together to provide you with the care you need, when you need it.  Your next appointment:   4 month(s)  Provider:   Hillis Lu, MD    We recommend signing up for the patient portal called "MyChart".  Sign up information is provided on this After Visit Summary.  MyChart is used to connect with patients for Virtual Visits (Telemedicine).  Patients are able to view lab/test results, encounter notes, upcoming appointments, etc.  Non-urgent messages can be sent to your provider as well.   To learn more about what you can do with MyChart, go to [email or web address].   Other Instructions Please make sure and stay hydrated before and after the cardiac CTA

## 2023-10-04 ENCOUNTER — Other Ambulatory Visit: Payer: Self-pay | Admitting: Pharmacist

## 2023-10-04 NOTE — Progress Notes (Signed)
 10/04/2023 Name: Toma Salzano MRN: [id number] DOB: [birth year]  Chief Complaint  Patient presents with   Diabetes   Medication Management    Rubina Akayla Braniff is a 68 y.o. year old female .Spoke with patient today   They were referred to the pharmacist by their PCP for assistance in managing medication access.   Subjective:  Medication Access/Adherence  Current Pharmacy:  [id number] - [place], [place] - [address] [place] [address] Phone: [phone] Fax: [phone]  [facility] - [place], [place] - [address] [place] [address] Phone: [phone] Fax: [phone]  [facility] [address] [place] [address] Phone: [phone] Fax: [phone]   Patient reports affordability concerns with their medications: Yes  Patient reports access/transportation concerns to their pharmacy: No  Patient reports adherence concerns with their medications:  No      Diabetes:  Current medications: metformin  500mg  twice a day with meals (patient reports she is only taking once a day - states she worries about possible side effect with metformin .   Medications tried in the past: ozempic  - caused abdominal pain and burning  Dr Rodrick Clapper has prescribed Jardiance  10mg  daily. Patient would like to stop metformin  however she has not been able to afford Jardiance  - has $255 deductible. We applied for [employer] medication assistance program. She has been approved to received Jardiance  10mg  daily - will receive her first shipment tomorrow.   Hyperlipidemia/ASCVD Risk Reduction  Current lipid lowering medications: rosuvastatin  5mg  daily - started about 2 or 3 weeks ago. Tolerating well.  She was seen by Dr Lafayette Pierre yesterday - started aspirin 81mg  daily and nitroglycerin as needed.  Dr Lafayette Pierre mentioned he  would like to see patient's LDL < 60 due to diabetes and CVD risk.   Mrs. Kay asks if rosuvastatin  can cause fluid retention. She feels that she has been more bloated that last few days. Fluid retention is not usually reported with rosuvastatin  but could be related to prednisone  she started a few days ago. She is on tapering dose of prednisone  and will complete course 10/07/2023.   Cholesterol medications tried in the past: atorvastatin  - 10mg  twice a week - took in 2022. Noted that it was stopped due to patient preference but Mrs. Kintner does not remember taking atorvastatin  or any medications for her cholesterol before.    Objective:  Lab Results  Component Value Date   HGBA1C 6.6 (H) 08/09/2023    Lab Results  Component Value Date   CREATININE 0.87 08/09/2023   BUN 15 08/09/2023   NA 139 08/09/2023   K 3.9 08/09/2023   CL 99 08/09/2023   CO2 30 08/09/2023    Lab Results  Component Value Date   CHOL 191 08/09/2023   HDL 50.30 08/09/2023   LDLCALC 114 (H) 08/09/2023   LDLDIRECT 110.0 08/21/2016   TRIG 135.0 08/09/2023   CHOLHDL 4 08/09/2023    Medications Reviewed Today     Reviewed by Cecilie Coffee, RPH-CPP (Pharmacist) on 10/04/23 at 1355  Med List Status: <None>   Medication Order Taking? Sig Documenting Provider Last Dose Status Informant  acetaminophen  (TYLENOL ) 650 MG CR tablet [id number]  Take 1,300 mg by mouth every 8 (eight) hours as needed for pain. [provider]  Active Self, Pharmacy Records  albuterol  (VENTOLIN  HFA) 108 [id number] Base) MCG/ACT inhaler [id number]  Inhale 2 puffs into the lungs every 6 (six) hours as needed for wheezing or shortness of breath. Dorrene Gaucher, NP  Active   aspirin EC 81 MG tablet [id number] Yes Take 1 tablet (81 mg total) by mouth daily. Swallow whole. Revankar, Micael Adas, MD Taking Active   busPIRone  (BUSPAR ) 5 MG tablet [id number] Yes Take 1 tablet (5 mg total) by mouth 2 (two) times daily. Neda Balk, MD Taking Active    calcium  carbonate (OSCAL) 1500 (600 Ca) MG TABS tablet [id number] Yes Take 1,500 mg by mouth 2 (two) times daily with a meal. [provider] Taking Active Self, Pharmacy Records  cholecalciferol  (VITAMIN D3) 25 MCG (1000 UNIT) tablet [id number] Yes Take 1,000 Units by mouth daily. [provider] Taking Active Self, Pharmacy Records  colchicine  0.6 MG tablet [id number]  Take 1 tablet (0.6 mg total) by mouth daily. 2 tabs po once and then 1 tab every 2 hours up to max of 6 tabs in 24 hours prn gout Neda Balk, MD  Active   Cyanocobalamin  (B-12) 1000 MCG CAPS [id number] Yes Take 1 capsule by mouth daily at 6 (six) AM. Dorrene Gaucher, NP Taking Active Self, Pharmacy Records  empagliflozin  (JARDIANCE ) 10 MG TABS tablet [id number] Yes Take 10 mg by mouth daily. [provider] Taking Active            Med Note Alida Ion, Glorianna Gott B   Thu Oct 04, 2023  1:45 PM) [employer] Program thru 05/28/2024  famotidine  (PEPCID ) 40 MG tablet [id number] Yes Take 1 tablet (40 mg total) by mouth daily. Neda Balk, MD Taking Active Self, Pharmacy Records  furosemide  (LASIX ) 40 MG tablet [id number] Yes TAKE 1 TABLET BY MOUTH  TWICE DAILY  Patient taking differently: Take 40 mg by mouth daily.   Neda Balk, MD Taking Active Self, Pharmacy Records  hydrochlorothiazide  (MICROZIDE ) 12.5 MG capsule [id number] Yes Take 1 capsule (12.5 mg total) by mouth daily. Neda Balk, MD Taking Active   metFORMIN  (GLUCOPHAGE ) 500 MG tablet [id number] Yes TAKE 1 TABLET BY MOUTH TWICE  DAILY WITH A MEAL  Patient taking differently: Take 500 mg by mouth daily with breakfast.   Neda Balk, MD Taking Active   methocarbamol (ROBAXIN) 500 MG tablet [id number] Yes Take 500 mg by mouth at bedtime as needed for muscle spasms. Can take up to every 8 hours if needed and not having side effects. [provider] Taking Active   metoprolol tartrate (LOPRESSOR) 100 MG tablet [id number]  Take 1 tablet (100 mg  total) by mouth once for 1 dose. Please take this medication 2 hours before CT Revankar, Micael Adas, MD  Expired 10/03/23 2359   Multiple Vitamins-Minerals (MULTIVITAMIN & MINERAL PO) [id number]  Take 1 tablet by mouth daily. [provider]  Active Self, Pharmacy Records  nitroGLYCERIN (NITROSTAT) 0.4 MG SL tablet [id number]  Place 1 tablet (0.4 mg total) under the tongue every 5 (five) minutes as needed for chest pain. Revankar, Micael Adas, MD  Active   predniSONE  (DELTASONE ) 10 MG tablet [id number] Yes Take 10 mg by mouth daily with breakfast. [provider]  Active   rosuvastatin  (CRESTOR ) 5 MG tablet [id number] Yes Take 1 tablet (5 mg total) by mouth daily. Neda Balk, MD Taking Active   telmisartan  (MICARDIS ) 20 MG tablet [id number] Yes TAKE 1 TABLET BY MOUTH DAILY Neda Balk, MD Taking Active               Assessment/Plan:  Diabetes: Last A1c was at goal of < 7.0 but patient would like to switch from metformin  to Jardiance  due to potential metformin  side effects and because Jardiance  has additional CKD and cardiovascular disease benefits. - Recommend to continue metformin  for now. She will start Jardiance  10mg  daily when she receives first shipment tomorrow.   - Approved thru 05/28/2024 to received Jardiance  from [facility] program.   Hyperlipidemia/ASCVD Risk Reduction: LDL goal < 60 per cardiologist notes 10/03/2023 - not at goal currently - Continue rosuvastatin  5mg  daily. Recheck lipids in 1 to 2 months.   I feel that bloating she is experiencing is most likely related to prednisone . She has about 5 days left of prednisone . Recommend she call office if she continues to experience bloating after she completes prednisone  course.    Follow Up Plan: 1 to 2 months.   Cecilie Coffee, PharmD Clinical Pharmacist [facility]

## 2023-10-08 DIAGNOSIS — M1711 Unilateral primary osteoarthritis, right knee: Secondary | ICD-10-CM | POA: Diagnosis not present

## 2023-10-08 NOTE — Progress Notes (Unsigned)
 [facility] History and Physical   Primary Care Physician:  Neda Balk, MD   Reason for Procedure:  History of adenomatous polyp and family history of cancer of the colon  Plan:    Colonoscopy     HPI: [name] is a 68 y.o. female status post removal of a diminutive adenoma in 2013.  She did not have any polyps but had severe diverticulosis in 2018.  She had an aunt with colon cancer and prefers closer follow-up.   Past Medical History:  Diagnosis Date   Allergic rhinitis 07/05/2007   Qualifier: Diagnosis of   By: Zane Hews         Allergic urticaria 03/27/2016   Allergy    Anemia 07/17/2016   Anxiety    Arthralgia 12/03/2016   Asthma    seasonal   Asthma with acute exacerbation 07/14/2014   ATTENTION DEFICIT DISORDER, INATTENTIVE TYPE 09/22/2009   Qualifier: Diagnosis of   By: Paulla Bossier MD, Dana Duncan      Replacing diagnoses that were inactivated after the 08/28/22 regulatory import     Baker's cyst of knee, right 02/26/2023   Blood transfusion without reported diagnosis    Breast cancer (HCC)    right   Colon polyps    CPAP (continuous positive airway pressure) dependence    DDD (degenerative disc disease), cervical 05/04/2021   Depression    Depression with anxiety 06/25/2006   Qualifier: Diagnosis of   By: Nilsa Bash         Dermographia 03/27/2016   Diverticulitis of colon without hemorrhage 09/21/2014   Ear canal dryness 08/03/2011   Edema    Educated about COVID-19 virus infection 04/19/2020   Elevated sed rate 04/03/2022   Essential hypertension 06/25/2006   Qualifier: Diagnosis of   By: Nilsa Bash         Family history of breast cancer in first degree relative    Fatigue 08/03/2011   Fibromyalgia    Gastroesophageal reflux disease without esophagitis 06/25/2006   Centricity Description: GERD  Qualifier: Diagnosis of   By: Nilsa Bash      Centricity Description: ESOPHAGEAL REFLUX  Qualifier: Diagnosis of   By:  Donnice Gale MD, Warnell Haagensen testing 05/15/2016   Negative for mutations within any of 32 genes on the Custom Cancer Panel through [employer].  No variants of uncertain significance (VUSes were found).  This Custom Panel offered by [facility] [place], MD) includes sequencing and/or deletion duplication testing of the following 34 genes: APC, ATM, AXIN2, BARD1, BMPR1A, BRCA1, BRCA2, BRIP1, CDH1, CDK4, CDKN2A, CHEK2, EPCAM, F   GERD (gastroesophageal reflux disease)    Gout of big toe 03/10/2013   Allergic to Allopurinol       History of breast cancer 03/27/2016   History of colonic polyps 08/14/2011   03/2006 - diminutive adenoma removed Willy Harvest)  08/14/2011 - diminutive cecal adenoma removed  IMO SNOMED Dx Update Oct 2024     Hyperglycemia 03/05/2017   Hyperlipidemia 08/21/2016   no meds   Hypertension    controlled by medications   Insomnia 02/15/2009   Qualifier: Diagnosis of   By: Paulla Bossier MD, Dana Duncan         Joint pain    Left arm pain 01/21/2021   Low back pain 01/11/2022   Memory changes 04/19/2020   Morbid obesity (HCC) 09/21/2010   Neck pain 05/02/2021   Neuromuscular disorder (HCC)    Fibromyalgia   Obesity  OSA (obstructive sleep apnea) 04/23/2012   12/13 Mod- AHI 15/h, corrected by CPAP 8 cm     Osteoarthritis    RA   Other peripheral vertigo, unspecified ear 10/27/2019   PAC (premature atrial contraction) 09/21/2010   Palpitations 09/21/2010   Peripheral neuropathy 04/19/2020   Gabapentin  caused edema     PONV (postoperative nausea and vomiting)    occasional   Pre-diabetes    Preventative health care 10/08/2012   Rheumatoid arthritis (HCC) 06/24/2015   History of diagnosis of RA is questionable. Not highly symptomatic at this time     Right hand pain 11/13/2022   Right knee pain 12/06/2016   RLS (restless legs syndrome) 06/07/2017   Spondylolisthesis of lumbar region 09/21/2021   TMJ arthralgia 04/19/2020   Type 2 diabetes  mellitus with obesity (HCC) 12/10/2011   dx'd 11/2011     Urinary incontinence 10/27/2019   Vertigo 03/14/2018   Viral meningitis    Vitamin D  deficiency 03/11/2008   Qualifier: Diagnosis of   By: Paulla Bossier MD, Dana Duncan          Past Surgical History:  Procedure Laterality Date   ABDOMINAL HYSTERECTOMY  1998   ADENOIDECTOMY     BACK SURGERY     09/21/21   BREAST EXCISIONAL BIOPSY Left 1993   benign   BREAST SURGERY     Tran flap due to breast cancer   CESAREAN SECTION  1988   COLONOSCOPY  08/14/2011   HAND SURGERY     dog bite, right hand   HAND SURGERY     trauma, left hand   KNEE ARTHROSCOPY     bilateral   left knee replacement  2010   LUMBAR WOUND DEBRIDEMENT N/A 10/07/2021   Procedure: LUMBAR WOUND DEBRIDEMENT/REVISION;  Surgeon: Audie Bleacher, MD;  Location: [facility] OR;  Service: Neurosurgery;  Laterality: N/A;  3C/RM 21 to follow Dr Nat Badger   MASTECTOMY  01/13/1994   Right breast   parathyroid  resection     PARATHYROIDECTOMY     POLYPECTOMY     rectal abscess     SEPTOPLASTY  1980   SHOULDER ARTHROSCOPY Right 11/09/2015   Procedure: ARTHROSCOPY SHOULDER-acromioplasty, distal clavicle resection and debridement;  Surgeon: Dayne Even, MD;  Location: [facility] OR;  Service: Orthopedics;  Laterality: Right;   shoulder arthroscopy     rotator cuff repair   TOE SURGERY Right    paronychia and adenoma removed   TONSILLECTOMY     TOTAL KNEE ARTHROPLASTY  06/18/2008   [place]   TUMOR REMOVAL  1982   , scalp    Prior to Admission medications   Medication Sig Start Date End Date Taking? Authorizing Provider  acetaminophen  (TYLENOL ) 650 MG CR tablet Take 1,300 mg by mouth every 8 (eight) hours as needed for pain.    [provider]  albuterol  (VENTOLIN  HFA) 108 (90 Base) MCG/ACT inhaler Inhale 2 puffs into the lungs every 6 (six) hours as needed for wheezing or shortness of breath. 03/22/22   Dorrene Gaucher, NP  aspirin  EC 81 MG tablet Take 1 tablet (81 mg total) by  mouth daily. Swallow whole. 10/03/23   Revankar, Micael Adas, MD  busPIRone  (BUSPAR ) 5 MG tablet Take 1 tablet (5 mg total) by mouth 2 (two) times daily. 08/09/23   Neda Balk, MD  calcium  carbonate (OSCAL) 1500 (600 Ca) MG TABS tablet Take 1,500 mg by mouth 2 (two) times daily with a meal.    [provider]  cholecalciferol  (VITAMIN D3) 25 MCG (  1000 UNIT) tablet Take 1,000 Units by mouth daily.    [provider]  colchicine  0.6 MG tablet Take 1 tablet (0.6 mg total) by mouth daily. 2 tabs po once and then 1 tab every 2 hours up to max of 6 tabs in 24 hours prn gout 08/12/23   Neda Balk, MD  Cyanocobalamin  (B-12) 1000 MCG CAPS Take 1 capsule by mouth daily at 6 (six) AM. 11/21/21   Dorrene Gaucher, NP  empagliflozin  (JARDIANCE ) 10 MG TABS tablet Take 10 mg by mouth daily.    [provider]  famotidine  (PEPCID ) 40 MG tablet Take 1 tablet (40 mg total) by mouth daily. 12/11/18   Neda Balk, MD  furosemide  (LASIX ) 40 MG tablet TAKE 1 TABLET BY MOUTH  TWICE DAILY Patient taking differently: Take 40 mg by mouth daily. 05/24/21   Neda Balk, MD  hydrochlorothiazide  (MICROZIDE ) 12.5 MG capsule Take 1 capsule (12.5 mg total) by mouth daily. 01/15/23   Neda Balk, MD  metFORMIN  (GLUCOPHAGE ) 500 MG tablet TAKE 1 TABLET BY MOUTH TWICE  DAILY WITH A MEAL Patient taking differently: Take 500 mg by mouth daily with breakfast. 09/04/22   Neda Balk, MD  methocarbamol (ROBAXIN) 500 MG tablet Take 500 mg by mouth at bedtime as needed for muscle spasms. Can take up to every 8 hours if needed and not having side effects.    [provider]  metoprolol  tartrate (LOPRESSOR ) 100 MG tablet Take 1 tablet (100 mg total) by mouth once for 1 dose. Please take this medication 2 hours before CT 10/03/23 10/03/23  Revankar, Rajan R, MD  Multiple Vitamins-Minerals (MULTIVITAMIN & MINERAL PO) Take 1 tablet by mouth daily.    [provider]  nitroGLYCERIN  (NITROSTAT )  0.4 MG SL tablet Place 1 tablet (0.4 mg total) under the tongue every 5 (five) minutes as needed for chest pain. 10/03/23   Revankar, Micael Adas, MD  rosuvastatin  (CRESTOR ) 5 MG tablet Take 1 tablet (5 mg total) by mouth daily. 09/20/23   Neda Balk, MD  telmisartan  (MICARDIS ) 20 MG tablet TAKE 1 TABLET BY MOUTH DAILY 05/09/23   Neda Balk, MD    Current Outpatient Medications  Medication Sig Dispense Refill   acetaminophen  (TYLENOL ) 650 MG CR tablet Take 1,300 mg by mouth every 8 (eight) hours as needed for pain.     busPIRone  (BUSPAR ) 5 MG tablet Take 1 tablet (5 mg total) by mouth 2 (two) times daily. 60 tablet 2   calcium  carbonate (OSCAL) 1500 (600 Ca) MG TABS tablet Take 1,500 mg by mouth 2 (two) times daily with a meal.     cholecalciferol  (VITAMIN D3) 25 MCG (1000 UNIT) tablet Take 1,000 Units by mouth daily.     colchicine  0.6 MG tablet Take 1 tablet (0.6 mg total) by mouth daily. 2 tabs po once and then 1 tab every 2 hours up to max of 6 tabs in 24 hours prn gout 30 tablet 3   Cyanocobalamin  (B-12) 1000 MCG CAPS Take 1 capsule by mouth daily at 6 (six) AM.     famotidine  (PEPCID ) 40 MG tablet Take 1 tablet (40 mg total) by mouth daily. 30 tablet 5   furosemide  (LASIX ) 40 MG tablet TAKE 1 TABLET BY MOUTH  TWICE DAILY (Patient taking differently: Take 40 mg by mouth daily.) 180 tablet 3   hydrochlorothiazide  (MICROZIDE ) 12.5 MG capsule Take 1 capsule (12.5 mg total) by mouth daily. 90 capsule 3   metFORMIN  (GLUCOPHAGE )  500 MG tablet TAKE 1 TABLET BY MOUTH TWICE  DAILY WITH A MEAL (Patient taking differently: Take 500 mg by mouth daily with breakfast.) 200 tablet 2   methocarbamol (ROBAXIN) 500 MG tablet Take 500 mg by mouth at bedtime as needed for muscle spasms. Can take up to every 8 hours if needed and not having side effects.     Multiple Vitamins-Minerals (MULTIVITAMIN & MINERAL PO) Take 1 tablet by mouth daily.     rosuvastatin  (CRESTOR ) 5 MG tablet Take 1 tablet (5 mg total) by  mouth daily. 90 tablet 3   telmisartan  (MICARDIS ) 20 MG tablet TAKE 1 TABLET BY MOUTH DAILY 100 tablet 0   albuterol  (VENTOLIN  HFA) 108 (90 Base) MCG/ACT inhaler Inhale 2 puffs into the lungs every 6 (six) hours as needed for wheezing or shortness of breath. 8 g 0   aspirin  EC 81 MG tablet Take 1 tablet (81 mg total) by mouth daily. Swallow whole. 90 tablet 3   empagliflozin  (JARDIANCE ) 10 MG TABS tablet Take 10 mg by mouth daily.     metoprolol  tartrate (LOPRESSOR ) 100 MG tablet Take 1 tablet (100 mg total) by mouth once for 1 dose. Please take this medication 2 hours before CT 1 tablet 0   nitroGLYCERIN  (NITROSTAT ) 0.4 MG SL tablet Place 1 tablet (0.4 mg total) under the tongue every 5 (five) minutes as needed for chest pain. 25 tablet 1   Current Facility-Administered Medications  Medication Dose Route Frequency Provider Last Rate Last Admin   0.9 %  sodium chloride  infusion  500 mL Intravenous Once Kenney Peacemaker, MD        Allergies as of 10/09/2023 - Review Complete 10/09/2023  Allergen Reaction Noted   Allopurinol  Hives 03/19/2017   Fluoxetine  Hives 04/28/2016   Mucinex  [guaifenesin  er] Shortness Of Breath 01/28/2016   Penicillins Hives, Other (See Comments), and Itching 06/13/2007   Pregabalin  Swelling 04/03/2022   Amitriptyline  Other (See Comments) 10/04/2017   Bupropion  Other (See Comments) 08/09/2023   Escitalopram oxalate Other (See Comments) 07/11/2016   Ozempic  (0.25 or 0.5 mg-dose) [semaglutide (0.25 or 0.5mg -dos)] Other (See Comments) 11/13/2022   Prozac  [fluoxetine  hcl] Hives 04/28/2016   Tirzepatide  Other (See Comments) 08/09/2023    Family History  Problem Relation Age of Onset   Emphysema Father    Lung cancer Father        lung ca dx 79   Other Father        prostate issues   Diabetes Father    Breast cancer Maternal Aunt        dx late 14s   Colon cancer Maternal Aunt        dx 50s   Colon polyps Maternal Aunt    Prostate cancer Maternal Grandfather 85        d. 85y   Heart disease Paternal Grandfather    Heart attack Paternal Grandfather        d. 50y   Diabetes Paternal Grandfather    Asthma Daughter        "seasonal"   Arthritis Maternal Grandmother    Aneurysm Maternal Grandmother        d. brain aneurysm at 23   Arthritis Mother    Colon polyps Mother    Hypertension Mother    Sleep apnea Mother    Multiple sclerosis Sister    Breast cancer Sister 54       L IDC and DCIS; ER/PR+, Her2-   Fibrocystic breast disease Sister  Arthritis/Rheumatoid Sister    Breast cancer Sister    Cancer Maternal Uncle        d. mouth cancer at younger age; smoker   Other Paternal Uncle        muscle issues - couldn't walk or talk; d. 20y   Cancer Maternal Aunt        lymphoma, dx 43s   Ovarian cancer Maternal Aunt        dx 31s; d. late 44s   Lung cancer Maternal Uncle        d. 50y; former smoker   Throat cancer Cousin        maternal 1st cousin; smoker   Breast cancer Cousin 81       maternal 1st cousin   Other Paternal Uncle        prostate issues   Breast cancer Cousin        paternal 1st cousin dx late 22s   Throat cancer Cousin        maternal 1st cousin; used SL tobacco   Prostate cancer Cousin        maternal 1st cousin dx 80s   Esophageal cancer Neg Hx    Rectal cancer Neg Hx    Stomach cancer Neg Hx     Social History   Socioeconomic History   Marital status: Widowed    Spouse name: Not on file   Number of children: 1   Years of education: Not on file   Highest education level: 12th grade  Occupational History   Occupation: ACCOUNTING    Employer: [employer]  Tobacco Use   Smoking status: Never   Smokeless tobacco: Never  Vaping Use   Vaping status: Never Used  Substance and Sexual Activity   Alcohol use: Not Currently   Drug use: No   Sexual activity: Not Currently  Other Topics Concern   Not on file  Social History Narrative   Not on file   Social Drivers of Health   Financial Resource  Strain: Low Risk  (08/22/2023)   Overall Financial Resource Strain (CARDIA)    Difficulty of Paying Living Expenses: Not very hard  Food Insecurity: No Food Insecurity (08/22/2023)   Hunger Vital Sign    Worried About Running Out of Food in the Last Year: Never true    Ran Out of Food in the Last Year: Never true  Transportation Needs: No Transportation Needs (08/22/2023)   PRAPARE - Administrator, Civil Service (Medical): No    Lack of Transportation (Non-Medical): No  Physical Activity: Inactive (08/22/2023)   Exercise Vital Sign    Days of Exercise per Week: 0 days    Minutes of Exercise per Session: 10 min  Stress: No Stress Concern Present (08/22/2023)   [employer] of Occupational Health - Occupational Stress Questionnaire    Feeling of Stress : Only a little  Social Connections: Moderately Integrated (08/22/2023)   Social Connection and Isolation Panel [NHANES]    Frequency of Communication with Friends and Family: More than three times a week    Frequency of Social Gatherings with Friends and Family: Three times a week    Attends Religious Services: More than 4 times per year    Active Member of Clubs or Organizations: Yes    Attends Banker Meetings: More than 4 times per year    Marital Status: Widowed  Intimate Partner Violence: Not At Risk (12/20/2022)   Humiliation, Afraid, Rape, and Kick questionnaire  Fear of Current or Ex-Partner: No    Emotionally Abused: No    Physically Abused: No    Sexually Abused: No    Review of Systems:  All other review of systems negative except as mentioned in the HPI.  Physical Exam: Vital signs BP (!) 117/57   Pulse 75   Temp 97.9 F (36.6 C)   Ht 5\' 6"  (1.676 m)   Wt 275 lb (124.7 kg)   SpO2 97%   BMI 44.39 kg/m   General:   Alert,  Well-developed, well-nourished, pleasant and cooperative in NAD Lungs:  Clear throughout to auscultation.   Heart:  Regular rate and rhythm; no murmurs, clicks,  rubs,  or gallops. Abdomen:  Soft, nontender and nondistended. Normal bowel sounds.   Neuro/Psych:  Alert and cooperative. Normal mood and affect. A and O x 3   @Laderrick Wilk  Tammie Fall, MD, [facility] [phone] (pager) 10/09/2023 9:14 AM@

## 2023-10-09 ENCOUNTER — Ambulatory Visit (AMBULATORY_SURGERY_CENTER): Admitting: Internal Medicine

## 2023-10-09 ENCOUNTER — Encounter: Payer: Self-pay | Admitting: Internal Medicine

## 2023-10-09 ENCOUNTER — Telehealth: Payer: Self-pay

## 2023-10-09 VITALS — BP 122/69 | HR 62 | Temp 97.9°F | Resp 12 | Ht 66.0 in | Wt 275.0 lb

## 2023-10-09 DIAGNOSIS — K6289 Other specified diseases of anus and rectum: Secondary | ICD-10-CM

## 2023-10-09 DIAGNOSIS — K5730 Diverticulosis of large intestine without perforation or abscess without bleeding: Secondary | ICD-10-CM | POA: Diagnosis not present

## 2023-10-09 DIAGNOSIS — Z1211 Encounter for screening for malignant neoplasm of colon: Secondary | ICD-10-CM | POA: Diagnosis not present

## 2023-10-09 DIAGNOSIS — I10 Essential (primary) hypertension: Secondary | ICD-10-CM | POA: Diagnosis not present

## 2023-10-09 DIAGNOSIS — Z800 Family history of malignant neoplasm of digestive organs: Secondary | ICD-10-CM

## 2023-10-09 DIAGNOSIS — M797 Fibromyalgia: Secondary | ICD-10-CM | POA: Diagnosis not present

## 2023-10-09 DIAGNOSIS — Z860101 Personal history of adenomatous and serrated colon polyps: Secondary | ICD-10-CM

## 2023-10-09 DIAGNOSIS — Z860100 Personal history of colon polyps, unspecified: Secondary | ICD-10-CM

## 2023-10-09 DIAGNOSIS — E785 Hyperlipidemia, unspecified: Secondary | ICD-10-CM | POA: Diagnosis not present

## 2023-10-09 MED ORDER — SODIUM CHLORIDE 0.9 % IV SOLN: 500.0000 mL | Freq: Once | INTRAVENOUS | Status: DC

## 2023-10-09 NOTE — Patient Instructions (Addendum)
 YOU HAD AN ENDOSCOPIC PROCEDURE TODAY AT THE [facility]:   Refer to the procedure report that was given to you for any specific questions about what was found during the examination.  If the procedure report does not answer your questions, please call your gastroenterologist to clarify.  If you requested that your care partner not be given the details of your procedure findings, then the procedure report has been included in a sealed envelope for you to review at your convenience later.  YOU SHOULD EXPECT: Some feelings of bloating in the abdomen. Passage of more gas than usual.  Walking can help get rid of the air that was put into your GI tract during the procedure and reduce the bloating. If you had a lower endoscopy (such as a colonoscopy or flexible sigmoidoscopy) you may notice spotting of blood in your stool or on the toilet paper. If you underwent a bowel prep for your procedure, you may not have a normal bowel movement for a few days.  Please Note:  You might notice some irritation and congestion in your nose or some drainage.  This is from the oxygen used during your procedure.  There is no need for concern and it should clear up in a day or so.  SYMPTOMS TO REPORT IMMEDIATELY:  Following lower endoscopy (colonoscopy or flexible sigmoidoscopy):  Excessive amounts of blood in the stool  Significant tenderness or worsening of abdominal pains  Swelling of the abdomen that is new, acute  Fever of 100F or higher   For urgent or emergent issues, a gastroenterologist can be reached at any hour by calling [phone]. Do not use MyChart messaging for urgent concerns.    DIET:  We do recommend a small meal at first, but then you may proceed to your regular diet.  Drink plenty of fluids but you should avoid alcoholic beverages for 24 hours.  ACTIVITY:  You should plan to take it easy for the rest of today and you should NOT DRIVE or use heavy machinery until tomorrow (because  of the sedation medicines used during the test).    FOLLOW UP: Our staff will call the number listed on your records the next business day following your procedure.  We will call around 7:15- 8:00 am to check on you and address any questions or concerns that you may have regarding the information given to you following your procedure. If we do not reach you, we will leave a message.        SIGNATURES/CONFIDENTIALITY: You and/or your care partner have signed paperwork which will be entered into your electronic medical record.  These signatures attest to the fact that that the information above on your After Visit Summary has been reviewed and is understood.  Full responsibility of the confidentiality of this discharge information lies with you and/or your care-partner.No polyps today. Given only one polyp in the past and none today and last time I am not recommending a routine repeat colonoscopy.  You do have diverticulosis - thickened muscle rings and pouches in the colon wall. Please read the handout about this condition.   I appreciate the opportunity to care for you. Kenney Peacemaker, MD, [facility]

## 2023-10-09 NOTE — Telephone Encounter (Signed)
 Called the patient to discuss her refusing to schedule her cardiac CT and she stated that she has changed her mind about having this test performed. I attempted to find out if it was a financial or some other reason for her not wanting to have the test performed and she just stated that she had changed her mind about scheduling the test. I encouraged her that if she wanted to have the test performed in the future to please call the office back. Patient verbalized understanding and had no further questions at this time.

## 2023-10-09 NOTE — Telephone Encounter (Signed)
-----   Message from Judge Notice sent at 10/04/2023  4:35 PM EDT ----- Regarding: ct update Hey, I just called to schedule her cta scan. She refused to scheduled. She do not wish to have this ct scan done at all.  Thanks, [place]

## 2023-10-09 NOTE — Progress Notes (Signed)
 Sedate, gd SR, tolerated procedure well, VSS, report to RN

## 2023-10-09 NOTE — Progress Notes (Signed)
 Pt's states no medical or surgical changes since previsit or office visit.

## 2023-10-09 NOTE — Op Note (Signed)
 [facility] Patient Name: [name] Procedure Date: 10/09/2023 9:14 AM MRN: [id number] Endoscopist: Kenney Peacemaker , MD, [id number] Age: 68 Referring MD:  Date of Birth: Jul 11, 1955 Gender: Female Account #: [id number] Procedure:                Colonoscopy Indications:              High risk colon cancer surveillance: Personal                            history of colonic polyps, Last colonoscopy: 2019 Medicines:                Monitored Anesthesia Care Procedure:                Pre-Anesthesia Assessment:                           - Prior to the procedure, a History and Physical                            was performed, and patient medications and                            allergies were reviewed. The patient's tolerance of                            previous anesthesia was also reviewed. The risks                            and benefits of the procedure and the sedation                            options and risks were discussed with the patient.                            All questions were answered, and informed consent                            was obtained. Prior Anticoagulants: The patient has                            taken no anticoagulant or antiplatelet agents. ASA                            Grade Assessment: III - A patient with severe                            systemic disease. After reviewing the risks and                            benefits, the patient was deemed in satisfactory                            condition to undergo the procedure.  After obtaining informed consent, the colonoscope                            was passed under direct vision. Throughout the                            procedure, the patient's blood pressure, pulse, and                            oxygen saturations were monitored continuously. The                            Olympus Scope SN [id number] was introduced through the                            anus  and advanced to the the cecum, identified by                            appendiceal orifice and ileocecal valve. The                            colonoscopy was performed without difficulty. The                            patient tolerated the procedure well. The quality                            of the bowel preparation was adequate. The                            ileocecal valve, appendiceal orifice, and rectum                            were photographed. The bowel preparation used was                            Miralax via split dose instruction. Scope In: 9:23:18 AM Scope Out: 9:36:29 AM Scope Withdrawal Time: 0 hours 10 minutes 55 seconds  Total Procedure Duration: 0 hours 13 minutes 11 seconds  Findings:                 The perianal and digital rectal examinations were                            normal.                           Multiple diverticula were found in the sigmoid                            colon and descending colon. There was narrowing of                            the colon in association with the diverticular  opening.                           Anal papilla(e) were hypertrophied.                           The exam was otherwise without abnormality on                            direct and retroflexion views. Complications:            No immediate complications. Estimated Blood Loss:     Estimated blood loss: none. Impression:               - Severe diverticulosis in the sigmoid colon and in                            the descending colon. There was narrowing of the                            colon in association with the diverticular opening.                           - Anal papilla(e) were hypertrophied.                           - The examination was otherwise normal on direct                            and retroflexion views.                           - No specimens collected.                           - Personal history of colonic polyp  diminutive                            adenoma 2013 and no polyps 2019. Recommendation:           - Patient has a contact number available for                            emergencies. The signs and symptoms of potential                            delayed complications were discussed with the                            patient. Return to normal activities tomorrow.                            Written discharge instructions were provided to the                            patient.                           -  Resume previous diet.                           - Continue present medications.                           - No repeat colonoscopy due to current age (82                            years or older) and the absence of colonic polyps. Kenney Peacemaker, MD 10/09/2023 9:49:24 AM This report has been signed electronically.

## 2023-10-10 ENCOUNTER — Telehealth: Payer: Self-pay | Admitting: *Deleted

## 2023-10-10 ENCOUNTER — Other Ambulatory Visit: Payer: Self-pay | Admitting: Family Medicine

## 2023-10-10 NOTE — Telephone Encounter (Signed)
  Follow up Call-     10/09/2023    8:50 AM  Call back number  Post procedure Call Back phone  # [phone]  Permission to leave phone message Yes    No answer at # given.  Unable to LM d/t no VM.

## 2023-10-16 DIAGNOSIS — M1711 Unilateral primary osteoarthritis, right knee: Secondary | ICD-10-CM | POA: Diagnosis not present

## 2023-10-22 NOTE — Assessment & Plan Note (Signed)
 hgba1c acceptable, minimize simple carbs. Increase exercise as tolerated. Continue current meds, started Jardiance  2 weeks ago. FSG yesterday 105 fasting and today 107. Stopped Metformin 

## 2023-10-22 NOTE — Assessment & Plan Note (Signed)
 Hydrate and monitor

## 2023-10-22 NOTE — Assessment & Plan Note (Signed)
 Supplement and monitor

## 2023-10-22 NOTE — Assessment & Plan Note (Signed)
 No recent exacerbation

## 2023-10-22 NOTE — Assessment & Plan Note (Signed)
 Well controlled, no changes to meds. Encouraged heart healthy diet such as the DASH diet and exercise as tolerated.

## 2023-10-23 ENCOUNTER — Telehealth (INDEPENDENT_AMBULATORY_CARE_PROVIDER_SITE_OTHER): Admitting: Family Medicine

## 2023-10-23 ENCOUNTER — Encounter: Payer: Self-pay | Admitting: Family Medicine

## 2023-10-23 DIAGNOSIS — J45909 Unspecified asthma, uncomplicated: Secondary | ICD-10-CM | POA: Diagnosis not present

## 2023-10-23 DIAGNOSIS — I159 Secondary hypertension, unspecified: Secondary | ICD-10-CM | POA: Diagnosis not present

## 2023-10-23 DIAGNOSIS — E559 Vitamin D deficiency, unspecified: Secondary | ICD-10-CM | POA: Diagnosis not present

## 2023-10-23 DIAGNOSIS — E782 Mixed hyperlipidemia: Secondary | ICD-10-CM

## 2023-10-23 DIAGNOSIS — E1169 Type 2 diabetes mellitus with other specified complication: Secondary | ICD-10-CM

## 2023-10-23 DIAGNOSIS — M109 Gout, unspecified: Secondary | ICD-10-CM | POA: Diagnosis not present

## 2023-10-23 DIAGNOSIS — E669 Obesity, unspecified: Secondary | ICD-10-CM

## 2023-10-23 DIAGNOSIS — I10 Essential (primary) hypertension: Secondary | ICD-10-CM | POA: Diagnosis not present

## 2023-10-23 MED ORDER — FUROSEMIDE 40 MG PO TABS: 40.0000 mg | ORAL_TABLET | Freq: Two times a day (BID) | ORAL | 3 refills | Status: DC

## 2023-10-23 NOTE — Progress Notes (Signed)
 MyChart Video Visit    Virtual Visit via Video Note   This patient is at least at moderate risk for complications without adequate follow up. This format is felt to be most appropriate for this patient at this time. Physical exam was limited by quality of the video and audio technology used for the visit. [name], CMA was able to get the patient set up on a video visit.  Patient location: home Patient and provider in visit Provider location: Office  I discussed the limitations of evaluation and management by telemedicine and the availability of in person appointments. The patient expressed understanding and agreed to proceed.  Visit Date: 10/23/2023  Today's healthcare provider: Randie Bustle, MD     Subjective:    Patient ID: [name], female    DOB: 06/27/1955, 68 y.o.   MRN: [id number]  Chief Complaint  Patient presents with   Quality Metric Gaps    Eye exam, mammogram   Medical Management of Chronic Issues    Patient presents today for a 2 month follow-up.    HPI Discussed the use of AI scribe software for clinical note transcription with the patient, who gave verbal consent to proceed.  History of Present Illness [name] is a 68 year old female with diabetes who presents for follow-up on her new medication regimen and a referral to cardiology.  She has been taking Jardiance  for two weeks, with fasting blood glucose levels recorded at 105 mg/dL and 784 mg/dL on consecutive days. She has discontinued metformin  to monitor the effects of Jardiance  alone. She experiences a slight reduction in appetite and manageable side effects, including mild loose stools.  She describes experiencing a 'tad bit of tightness' in her chest, particularly when excited or stressed. She has a history of being told she had a blockage, though she was informed it was not critical.  She has not experienced recent issues with gout and finds relief for her peripheral  neuropathy using an essential oil called 'aches and pains', which she applies to her feet at night when symptoms typically worsen.  She has received a new prescription for Lasix  and expresses satisfaction with the pharmacy staff's assistance.    Past Medical History:  Diagnosis Date   Allergic rhinitis 07/05/2007   Qualifier: Diagnosis of   By: Zane Hews         Allergic urticaria 03/27/2016   Allergy    Anemia 07/17/2016   Anxiety    Arthralgia 12/03/2016   Asthma    seasonal   Asthma with acute exacerbation 07/14/2014   ATTENTION DEFICIT DISORDER, INATTENTIVE TYPE 09/22/2009   Qualifier: Diagnosis of   By: Paulla Bossier MD, Dana Duncan      Replacing diagnoses that were inactivated after the 08/28/22 regulatory import     Baker's cyst of knee, right 02/26/2023   Blood transfusion without reported diagnosis    Breast cancer (HCC)    right   Colon polyps    CPAP (continuous positive airway pressure) dependence    DDD (degenerative disc disease), cervical 05/04/2021   Depression    Depression with anxiety 06/25/2006   Qualifier: Diagnosis of   By: Nilsa Bash         Dermographia 03/27/2016   Diverticulitis of colon without hemorrhage 09/21/2014   Ear canal dryness 08/03/2011   Edema    Educated about COVID-19 virus infection 04/19/2020   Elevated sed rate 04/03/2022   Essential hypertension 06/25/2006   Qualifier: Diagnosis of   By:  Nilsa Bash         Family history of breast cancer in first degree relative    Fatigue 08/03/2011   Fibromyalgia    Gastroesophageal reflux disease without esophagitis 06/25/2006   Centricity Description: GERD  Qualifier: Diagnosis of   By: Nilsa Bash      Centricity Description: ESOPHAGEAL REFLUX  Qualifier: Diagnosis of   By: Donnice Gale MD, Warnell Haagensen testing 05/15/2016   Negative for mutations within any of 32 genes on the Custom Cancer Panel through [employer].  No variants of uncertain significance (VUSes were  found).  This Custom Panel offered by [facility] [place], MD) includes sequencing and/or deletion duplication testing of the following 34 genes: APC, ATM, AXIN2, BARD1, BMPR1A, BRCA1, BRCA2, BRIP1, CDH1, CDK4, CDKN2A, CHEK2, EPCAM, F   GERD (gastroesophageal reflux disease)    Gout of big toe 03/10/2013   Allergic to Allopurinol       History of breast cancer 03/27/2016   History of colonic polyps 08/14/2011   03/2006 - diminutive adenoma removed Willy Harvest)  08/14/2011 - diminutive cecal adenoma removed  IMO SNOMED Dx Update Oct 2024     Hyperglycemia 03/05/2017   Hyperlipidemia 08/21/2016   no meds   Hypertension    controlled by medications   Insomnia 02/15/2009   Qualifier: Diagnosis of   By: Paulla Bossier MD, Dana Duncan         Joint pain    Left arm pain 01/21/2021   Low back pain 01/11/2022   Memory changes 04/19/2020   Morbid obesity (HCC) 09/21/2010   Neck pain 05/02/2021   Neuromuscular disorder (HCC)    Fibromyalgia   Obesity    OSA (obstructive sleep apnea) 04/23/2012   12/13 Mod- AHI 15/h, corrected by CPAP 8 cm     Osteoarthritis    RA   Other peripheral vertigo, unspecified ear 10/27/2019   PAC (premature atrial contraction) 09/21/2010   Palpitations 09/21/2010   Peripheral neuropathy 04/19/2020   Gabapentin  caused edema     PONV (postoperative nausea and vomiting)    occasional   Pre-diabetes    Preventative health care 10/08/2012   Rheumatoid arthritis (HCC) 06/24/2015   History of diagnosis of RA is questionable. Not highly symptomatic at this time     Right hand pain 11/13/2022   Right knee pain 12/06/2016   RLS (restless legs syndrome) 06/07/2017   Spondylolisthesis of lumbar region 09/21/2021   TMJ arthralgia 04/19/2020   Type 2 diabetes mellitus with obesity (HCC) 12/10/2011   dx'd 11/2011     Urinary incontinence 10/27/2019   Vertigo 03/14/2018   Viral meningitis    Vitamin D  deficiency 03/11/2008   Qualifier: Diagnosis of   By: Paulla Bossier MD,  Dana Duncan          Past Surgical History:  Procedure Laterality Date   ABDOMINAL HYSTERECTOMY  1998   ADENOIDECTOMY     BACK SURGERY     09/21/21   BREAST EXCISIONAL BIOPSY Left 1993   benign   BREAST SURGERY     Tran flap due to breast cancer   CESAREAN SECTION  1988   COLONOSCOPY  08/14/2011   HAND SURGERY     dog bite, right hand   HAND SURGERY     trauma, left hand   KNEE ARTHROSCOPY     bilateral   left knee replacement  2010   LUMBAR WOUND DEBRIDEMENT N/A 10/07/2021   Procedure: LUMBAR WOUND DEBRIDEMENT/REVISION;  Surgeon: Audie Bleacher, MD;  Location: [facility] OR;  Service: Neurosurgery;  Laterality: N/A;  3C/RM 21 to follow Dr Nat Badger   MASTECTOMY  01/13/1994   Right breast   parathyroid  resection     PARATHYROIDECTOMY     POLYPECTOMY     rectal abscess     SEPTOPLASTY  1980   SHOULDER ARTHROSCOPY Right 11/09/2015   Procedure: ARTHROSCOPY SHOULDER-acromioplasty, distal clavicle resection and debridement;  Surgeon: Dayne Even, MD;  Location: [facility] OR;  Service: Orthopedics;  Laterality: Right;   shoulder arthroscopy     rotator cuff repair   TOE SURGERY Right    paronychia and adenoma removed   TONSILLECTOMY     TOTAL KNEE ARTHROPLASTY  06/18/2008   Daldorf   TUMOR REMOVAL  1982   , scalp    Family History  Problem Relation Age of Onset   Emphysema Father    Lung cancer Father        lung ca dx 54   Other Father        prostate issues   Diabetes Father    Breast cancer Maternal Aunt        dx late 54s   Colon cancer Maternal Aunt        dx 75s   Colon polyps Maternal Aunt    Prostate cancer Maternal Grandfather 21       d. 85y   Heart disease Paternal Grandfather    Heart attack Paternal Grandfather        d. 50y   Diabetes Paternal Grandfather    Asthma Daughter        "seasonal"   Arthritis Maternal Grandmother    Aneurysm Maternal Grandmother        d. brain aneurysm at 44   Arthritis Mother    Colon polyps Mother    Hypertension Mother     Sleep apnea Mother    Multiple sclerosis Sister    Breast cancer Sister 76       L IDC and DCIS; ER/PR+, Her2-   Fibrocystic breast disease Sister    Arthritis/Rheumatoid Sister    Breast cancer Sister    Cancer Maternal Uncle        d. mouth cancer at younger age; smoker   Other Paternal Uncle        muscle issues - couldn't walk or talk; d. 20y   Cancer Maternal Aunt        lymphoma, dx 71s   Ovarian cancer Maternal Aunt        dx 79s; d. late 62s   Lung cancer Maternal Uncle        d. 23y; former smoker   Throat cancer Cousin        maternal 1st cousin; smoker   Breast cancer Cousin 40       maternal 1st cousin   Other Paternal Uncle        prostate issues   Breast cancer Cousin        paternal 1st cousin dx late 47s   Throat cancer Cousin        maternal 1st cousin; used SL tobacco   Prostate cancer Cousin        maternal 1st cousin dx 73s   Esophageal cancer Neg Hx    Rectal cancer Neg Hx    Stomach cancer Neg Hx     Social History   Socioeconomic History   Marital status: Widowed    Spouse name: Not on file   Number of children: 1  Years of education: Not on file   Highest education level: 12th grade  Occupational History   Occupation: ACCOUNTING    Employer: [employer]  Tobacco Use   Smoking status: Never   Smokeless tobacco: Never  Vaping Use   Vaping status: Never Used  Substance and Sexual Activity   Alcohol use: Not Currently   Drug use: No   Sexual activity: Not Currently  Other Topics Concern   Not on file  Social History Narrative   Not on file   Social Drivers of Health   Financial Resource Strain: Low Risk  (08/22/2023)   Overall Financial Resource Strain (CARDIA)    Difficulty of Paying Living Expenses: Not very hard  Food Insecurity: No Food Insecurity (08/22/2023)   Hunger Vital Sign    Worried About Running Out of Food in the Last Year: Never true    Ran Out of Food in the Last Year: Never true  Transportation Needs: No  Transportation Needs (08/22/2023)   PRAPARE - Administrator, Civil Service (Medical): No    Lack of Transportation (Non-Medical): No  Physical Activity: Inactive (08/22/2023)   Exercise Vital Sign    Days of Exercise per Week: 0 days    Minutes of Exercise per Session: 10 min  Stress: No Stress Concern Present (08/22/2023)   [employer] of Occupational Health - Occupational Stress Questionnaire    Feeling of Stress : Only a little  Social Connections: Moderately Integrated (08/22/2023)   Social Connection and Isolation Panel [NHANES]    Frequency of Communication with Friends and Family: More than three times a week    Frequency of Social Gatherings with Friends and Family: Three times a week    Attends Religious Services: More than 4 times per year    Active Member of Clubs or Organizations: Yes    Attends Banker Meetings: More than 4 times per year    Marital Status: Widowed  Intimate Partner Violence: Not At Risk (12/20/2022)   Humiliation, Afraid, Rape, and Kick questionnaire    Fear of Current or Ex-Partner: No    Emotionally Abused: No    Physically Abused: No    Sexually Abused: No    Outpatient Medications Prior to Visit  Medication Sig Dispense Refill   acetaminophen  (TYLENOL ) 650 MG CR tablet Take 1,300 mg by mouth every 8 (eight) hours as needed for pain.     albuterol  (VENTOLIN  HFA) 108 (90 Base) MCG/ACT inhaler Inhale 2 puffs into the lungs every 6 (six) hours as needed for wheezing or shortness of breath. 8 g 0   aspirin  EC 81 MG tablet Take 1 tablet (81 mg total) by mouth daily. Swallow whole. 90 tablet 3   busPIRone  (BUSPAR ) 5 MG tablet Take 1 tablet (5 mg total) by mouth 2 (two) times daily. 60 tablet 2   calcium  carbonate (OSCAL) 1500 (600 Ca) MG TABS tablet Take 1,500 mg by mouth 2 (two) times daily with a meal.     cholecalciferol  (VITAMIN D3) 25 MCG (1000 UNIT) tablet Take 1,000 Units by mouth daily.     colchicine  0.6 MG tablet Take  1 tablet (0.6 mg total) by mouth daily. 2 tabs po once and then 1 tab every 2 hours up to max of 6 tabs in 24 hours prn gout 30 tablet 3   Cyanocobalamin  (B-12) 1000 MCG CAPS Take 1 capsule by mouth daily at 6 (six) AM.     empagliflozin  (JARDIANCE ) 10 MG TABS tablet  Take 10 mg by mouth daily.     famotidine  (PEPCID ) 40 MG tablet Take 1 tablet (40 mg total) by mouth daily. 30 tablet 5   hydrochlorothiazide  (MICROZIDE ) 12.5 MG capsule TAKE 1 CAPSULE BY MOUTH DAILY 100 capsule 2   metFORMIN  (GLUCOPHAGE ) 500 MG tablet TAKE 1 TABLET BY MOUTH TWICE  DAILY WITH A MEAL (Patient taking differently: Take 500 mg by mouth daily with breakfast.) 200 tablet 2   methocarbamol (ROBAXIN) 500 MG tablet Take 500 mg by mouth at bedtime as needed for muscle spasms. Can take up to every 8 hours if needed and not having side effects.     Multiple Vitamins-Minerals (MULTIVITAMIN & MINERAL PO) Take 1 tablet by mouth daily.     nitroGLYCERIN  (NITROSTAT ) 0.4 MG SL tablet Place 1 tablet (0.4 mg total) under the tongue every 5 (five) minutes as needed for chest pain. 25 tablet 1   rosuvastatin  (CRESTOR ) 5 MG tablet Take 1 tablet (5 mg total) by mouth daily. 90 tablet 3   telmisartan  (MICARDIS ) 20 MG tablet TAKE 1 TABLET BY MOUTH DAILY 100 tablet 2   furosemide  (LASIX ) 40 MG tablet TAKE 1 TABLET BY MOUTH  TWICE DAILY (Patient taking differently: Take 40 mg by mouth daily.) 180 tablet 3   metoprolol  tartrate (LOPRESSOR ) 100 MG tablet Take 1 tablet (100 mg total) by mouth once for 1 dose. Please take this medication 2 hours before CT 1 tablet 0   No facility-administered medications prior to visit.    Allergies  Allergen Reactions   Allopurinol  Hives   Fluoxetine  Hives   Mucinex  [Guaifenesin  Er] Shortness Of Breath   Penicillins Hives, Other (See Comments) and Itching    Has patient had a PCN reaction causing immediate rash, facial/tongue/throat swelling, SOB or lightheadedness with hypotension: no  Has patient had a PCN  reaction causing severe rash involving mucus membranes or skin necrosis: no  Has patient had a PCN reaction that required hospitalization no  Has patient had a PCN reaction occurring within the last 10 years: no  If all of the above answers are "NO", then may proceed with Cephalosporin use.  Product containing penicillin (product)   Pregabalin  Swelling   Amitriptyline  Other (See Comments)    "felt weird, fatigue, dizziness"  amitriptyline    Bupropion  Other (See Comments)    Uncontrolled movements of tongue, hand   Escitalopram Oxalate Other (See Comments)    Nausea and hypersalivation.  escitalopram oxalate   Ozempic  (0.25 Or 0.5 Mg-Dose) [Semaglutide (0.25 Or 0.5mg -Dos)] Other (See Comments)    Abdominal pain   Prozac  [Fluoxetine  Hcl] Hives   Tirzepatide  Other (See Comments)    Review of Systems  Constitutional:  Positive for malaise/fatigue. Negative for fever.  HENT:  Negative for congestion.   Eyes:  Negative for blurred vision.  Respiratory:  Negative for shortness of breath.   Cardiovascular:  Positive for chest pain. Negative for palpitations and leg swelling.  Gastrointestinal:  Negative for abdominal pain, blood in stool and nausea.  Genitourinary:  Negative for dysuria and frequency.  Musculoskeletal:  Negative for falls.  Skin:  Negative for rash.  Neurological:  Negative for dizziness, loss of consciousness and headaches.  Endo/Heme/Allergies:  Negative for environmental allergies.  Psychiatric/Behavioral:  Negative for depression. The patient is nervous/anxious.        Objective:     Physical Exam Constitutional:      General: She is not in acute distress.    Appearance: Normal appearance. She is not ill-appearing or toxic-appearing.  HENT:     Head: Normocephalic and atraumatic.     Right Ear: External ear normal.     Left Ear: External ear normal.     Nose: Nose normal.  Eyes:     General:        Right eye: No discharge.        Left eye: No  discharge.  Pulmonary:     Effort: Pulmonary effort is normal.  Skin:    Findings: No rash.  Neurological:     Mental Status: She is alert and oriented to person, place, and time.  Psychiatric:        Behavior: Behavior normal.     There were no vitals taken for this visit. Wt Readings from Last 3 Encounters:  10/09/23 275 lb (124.7 kg)  10/03/23 285 lb 0.6 oz (129.3 kg)  09/25/23 275 lb (124.7 kg)       Assessment & Plan:  Uncomplicated asthma, unspecified asthma severity, unspecified whether persistent Assessment & Plan: No recent exacerbation   Vitamin D  deficiency Assessment & Plan: Supplement and monitor   Orders: -     VITAMIN D  25 Hydroxy (Vit-D Deficiency, Fractures); Future  Type 2 diabetes mellitus with obesity (HCC) Assessment & Plan: hgba1c acceptable, minimize simple carbs. Increase exercise as tolerated. Continue current meds   Orders: -     Ambulatory referral to Cardiology -     Hemoglobin A1c; Future  Secondary hypertension Assessment & Plan: Well controlled, no changes to meds. Encouraged heart healthy diet such as the DASH diet and exercise as tolerated.    Orders: -     Ambulatory referral to Cardiology  Gout of big toe Assessment & Plan: Hydrate and monitor   Orders: -     Uric acid; Future  Essential hypertension -     Furosemide ; Take 1 tablet (40 mg total) by mouth 2 (two) times daily.  Dispense: 180 tablet; Refill: 3 -     Ambulatory referral to Cardiology -     Comprehensive metabolic panel with GFR; Future -     CBC with Differential/Platelet; Future -     TSH; Future  Mixed hyperlipidemia -     Ambulatory referral to Cardiology -     Lipid panel; Future     Assessment and Plan Assessment & Plan Type 2 diabetes mellitus with obesity Blood glucose well-controlled with Jardiance  10 mg daily. Discontinued metformin  due to hypoglycemia concerns. Jardiance  beneficial for kidney, heart, and weight management. Slight appetite  reduction and manageable GI side effects noted. - Continue Jardiance  10 mg oral daily. - Reevaluate blood glucose control in a few months. - Monitor for GI side effects, especially post high sugar intake. - Plan blood work for late June or later.  Cardiology referral New cardiology referral needed due to dissatisfaction with previous care and mild chest discomfort during stress or excitement. - Initiate new cardiology referral for chest discomfort evaluation and risk assessment.  Peripheral neuropathy Symptoms worsen at night, relief with essential oil labeled 'aches and pains'. - Provide details or picture of essential oil used for neuropathy relief.     I discussed the assessment and treatment plan with the patient. The patient was provided an opportunity to ask questions and all were answered. The patient agreed with the plan and demonstrated an understanding of the instructions.   The patient was advised to call back or seek an in-person evaluation if the symptoms worsen or if the condition fails to improve as anticipated.  Randie Bustle, MD [facility] at [facility] [phone] (phone) [phone] (fax)  [facility]

## 2023-11-01 ENCOUNTER — Other Ambulatory Visit (HOSPITAL_BASED_OUTPATIENT_CLINIC_OR_DEPARTMENT_OTHER)

## 2023-11-02 ENCOUNTER — Other Ambulatory Visit: Payer: Self-pay | Admitting: Family Medicine

## 2023-11-05 ENCOUNTER — Telehealth: Payer: Self-pay | Admitting: Cardiology

## 2023-11-05 NOTE — Telephone Encounter (Signed)
 Pt called in requesting to switch to Dr. Katheryne Pane. Is this switch okay?

## 2023-11-09 ENCOUNTER — Ambulatory Visit

## 2023-11-12 ENCOUNTER — Ambulatory Visit

## 2023-11-13 ENCOUNTER — Ambulatory Visit
Admission: RE | Admit: 2023-11-13 | Discharge: 2023-11-13 | Disposition: A | Discharge status: Home / Self Care | Source: Ambulatory Visit | Attending: Obstetrics and Gynecology | Admitting: Obstetrics and Gynecology

## 2023-11-13 DIAGNOSIS — Z1231 Encounter for screening mammogram for malignant neoplasm of breast: Secondary | ICD-10-CM | POA: Diagnosis not present

## 2023-11-19 ENCOUNTER — Ambulatory Visit: Admitting: Cardiology

## 2023-11-22 ENCOUNTER — Other Ambulatory Visit: Payer: Self-pay | Admitting: Pharmacist

## 2023-11-22 NOTE — Progress Notes (Signed)
 11/22/2023 Name: [name] MRN: [id number] DOB: Jan 12, 1956  No chief complaint on file.   [name] is a 68 y.o. year old female .Spoke with patient today   They were referred to the pharmacist by their PCP for assistance in managing medication access.   Subjective:  Medication Access/Adherence  Current Pharmacy:  [id number] - [place], [place] - [address] [place] [address] Phone: [phone] Fax: [phone]  [facility] - [place], [place] - [address] [place] [address] Phone: [phone] Fax: [phone]  [facility] - [facility] [address] [place] [address] Phone: [phone] Fax: [phone]   Patient reports affordability concerns with their medications: Yes  Patient reports access/transportation concerns to their pharmacy: No  Patient reports adherence concerns with their medications:  No      Diabetes:  Current medications: Jardiance  10mg  once a daily - started about 6 weeks ago - Approved to get thru [employer] thru 05/28/2024 (  Medications tried in the past: ozempic  - caused abdominal pain and burning; metfomrin - stopped due to patient's concerns with side effects.   Home blood glucose readings:  6/22 was 121 - she was worried about blood glucose being too high and too a metformin  the night of 6/22 6/23 was 93 - she felt bad and noticed blood glucose was low. Denies skipped meals 6/26 - this morning blood glucose was 171 (ate Life cereal last night); checked about 1 hour ago and blood glucose was 131.  She feels that blood glucose was lower prior to starting Jardiance  - usually was 100 to 110.    Hyperlipidemia/ASCVD Risk Reduction  Current lipid lowering medications: rosuvastatin  5mg  daily - started about 1 to 2 months ago. Tolerating well.  She was seen by Dr  Edwyna yesterday - started aspirin  81mg  daily and nitroglycerin  as needed.  Dr Edwyna mentioned he would like to see patient's LDL < 60 due to diabetes and CVD risk.   Cholesterol medications tried in the past: atorvastatin  - 10mg  twice a week - took in 2022. Noted that it was stopped due to patient preference but [name] does not remember taking atorvastatin  or any medications for her cholesterol before.    Objective:  Lab Results  Component Value Date   HGBA1C 6.6 (H) 08/09/2023    Lab Results  Component Value Date   CREATININE 0.87 08/09/2023   BUN 15 08/09/2023   NA 139 08/09/2023   K 3.9 08/09/2023   CL 99 08/09/2023   CO2 30 08/09/2023    Lab Results  Component Value Date   CHOL 191 08/09/2023   HDL 50.30 08/09/2023   LDLCALC 114 (H) 08/09/2023   LDLDIRECT 110.0 08/21/2016   TRIG 135.0 08/09/2023   CHOLHDL 4 08/09/2023    Medications Reviewed Today   Medications were not reviewed in this encounter       Assessment/Plan:   Diabetes: Last A1c was at goal of < 7.0 but patient would like to switch from metformin  to Jardiance  due to potential metformin  side effects and because Jardiance  has additional CKD and cardiovascular disease benefits. - Recommend to continue Jardiance  10mg  daily.  - Patient will come in 6/30 to have Continuous Glucose Monitor placed for 15 days. I think this might help patient identify factors that might be affecting blood glucose and if needed we could increase  Jardiance  to 25mg . - Approved thru 05/28/2024 to received Jardiance  from [facility] program.   Hyperlipidemia/ASCVD Risk Reduction: LDL goal < 60 per cardiologist notes 10/03/2023 - not at goal currently - Continue rosuvastatin  5mg  daily. Will recheck lipids and have other labs ordered by Dr Domenica at last visit     Follow Up Plan: in office visit 11/26/2023 at 8am  Madelin Ray, PharmD Clinical Pharmacist [facility]

## 2023-11-25 ENCOUNTER — Encounter: Payer: Self-pay | Admitting: Pharmacist

## 2023-11-26 ENCOUNTER — Ambulatory Visit

## 2023-11-26 ENCOUNTER — Other Ambulatory Visit

## 2023-11-26 ENCOUNTER — Telehealth: Payer: Self-pay

## 2023-11-26 NOTE — Telephone Encounter (Signed)
 Initial Comment Caller says she has covid she wants to know what she should do, stuffy nose, sore throat and headache Translation No Nurse Assessment Nurse: Marylen, RN, Ashante Date/Time (Eastern Time): 11/24/2023 10:35:34 AM Confirm and document reason for call. If symptomatic, describe symptoms. ---The caller states that she took a home test that was positive for covid. She has had a sore throat, stuffy nose and headache since Thursday night 2/10 pain. Does the patient have any new or worsening symptoms? ---Yes Will a triage be completed? ---Yes Related visit to physician within the last 2 weeks? ---No Does the PT have any chronic conditions? (i.e. diabetes, asthma, this includes High risk factors for pregnancy, etc.) ---No Is this a behavioral health or substance abuse call? ---No Guidelines Guideline Title Affirmed Question Affirmed Notes Nurse Date/Time (Eastern Time) COVID-19 - Diagnosed or Suspected [1] COVID-19 diagnosed by positive lab test (e.g., PCR, rapid self-test kit) AND [2] mild symptoms (e.g., cough, fever, others) AND [3] no complications or SOB Marylen, RN, Ashante 11/24/2023 10:37:28 AM PLEASE NOTE: All timestamps contained within this report are represented as [place] Standard Time. CONFIDENTIALTY NOTICE: This fax transmission is intended only for the addressee. It contains information that is legally privileged, confidential or otherwise protected from use or disclosure. If you are not the intended recipient, you are strictly prohibited from reviewing, disclosing, copying using or disseminating any of this information or taking any action in reliance on or regarding this information. If you have received this fax in error, please notify us  immediately by telephone so that we can arrange for its return to us . Phone: [phone], Toll-Free: [phone], Fax: [phone] DENETTE_MOORE 11/27/1955 Page: 1 of2 CallId: [id number] Disp. Time [place] Time)  Disposition Final User 11/24/2023 10:48:43 AM Home Care Yes Marylen, RN, Ashante Final Disposition 11/24/2023 10:48:43 AM Home Care Yes Marylen, RN, Ashante Caller Disagree/Comply Comply Caller Understands Yes PreDisposition Did not know what to do Care Advice Given Per Guideline HOME CARE: * You should be able to treat this at home. REASSURANCE AND EDUCATION - POSITIVE COVID-19 LAB TEST AND MILD SYMPTOMS: * A positive result on a PCR or rapid self-test kit is highly accurate for diagnosing COVID-19. It is highly likely that you have COVID-19. * From what you have told me, your symptoms are mild. That is reassuring. * Feeling dehydrated: Drink extra liquids. If the air in your home is dry, use a humidifier. * HOME REMEDY - HONEY: This old home remedy has been shown to help decrease coughing at night. The adult dosage is 2 teaspoons (10 ml) at bedtime. AVOID TOBACCO SMOKE: * Avoid smoke from tobacco and e-cigarettes. * Smoking or being exposed to smoke makes coughs much worse. PAIN AND FEVER MEDICINES: * For pain or fever relief, take either acetaminophen  or ibuprofen . NO ASPIRIN : * Do not use aspirin  for treatment of fever or pain. * For many people with COVID-19 the average DURATION OF SYMPTOMS is about: * ... Body aches, fatigue, headache: 3 to 7 days * ... Loss of smell or taste: 7 to 10 days * ... Cough and fatigue: 2 to 3 weeks COVID-19 - HOW TO PROTECT OTHERS - WHEN YOU ARE SICK WITH COVID-19: * Stay at home until the fever is gone and you are feeling better. * You can go back to your normal activities when, for at least 24 HOURS, both are true: * ... Your SYMPTOMS ARE GETTING BETTER overall, and * ... You have NOT HAD A FEVER (and are not using fever-reducing medicine).  CALL BACK IF: * Fever over 103 F (39.4 C) * Fever lasts over 3 days * Fever returns after being gone for 24 hours * Chest pain or difficulty breathing occurs * Cough or other symptoms last more than 3 weeks * You become  worse CARE ADVICE given per COVID-19 - DIAGNOSED OR SUSPECTED (Adult) guideline.

## 2023-12-05 ENCOUNTER — Ambulatory Visit (INDEPENDENT_AMBULATORY_CARE_PROVIDER_SITE_OTHER): Admitting: Pharmacist

## 2023-12-05 VITALS — BP 114/76 | HR 70

## 2023-12-05 DIAGNOSIS — E669 Obesity, unspecified: Secondary | ICD-10-CM

## 2023-12-05 DIAGNOSIS — E1169 Type 2 diabetes mellitus with other specified complication: Secondary | ICD-10-CM

## 2023-12-05 DIAGNOSIS — E782 Mixed hyperlipidemia: Secondary | ICD-10-CM | POA: Diagnosis not present

## 2023-12-05 DIAGNOSIS — Z7984 Long term (current) use of oral hypoglycemic drugs: Secondary | ICD-10-CM

## 2023-12-05 NOTE — Progress Notes (Signed)
 12/05/2023 Name: [name] MRN: [id number] DOB: 03-08-1956  Chief Complaint  Patient presents with   Diabetes   Medication Management    [name] is a 68 y.o. year old female. Patient was seen in the office for a visit today.   They were referred to the pharmacist by their PCP for assistance in managing medication access.   Subjective:  Medication Access/Adherence  Current Pharmacy:  [facility] - [place], [place] - [address] [place] [address] Phone: [phone] Fax: [phone]  [facility] - [place] - [address] [place]  [address] Phone: [phone] Fax: [phone]  [facility] - [facility] [address] [place] [address] Phone: [phone] Fax: [phone]   Patient reports affordability concerns with their medications: Yes  Patient reports access/transportation concerns to their pharmacy: No  Patient reports adherence concerns with their medications:  No      Diabetes:  Current medications: Jardiance  10mg  once a daily - started about 8 weeks ago - Approved to get thru [employer] thru 05/28/2024  Medications tried in the past: Mounjaro  and ozempic  - caused abdominal pain and burning; metfomrin - stopped due to patient's concerns with side effects.   Home blood glucose readings:  Has ranged between 110 and 170. She has recently had COVID - 10 days ago. She feels that blood glucose was lower prior to starting Jardiance  - usually was 100 to 110.    Hyperlipidemia/ASCVD Risk Reduction  Current lipid lowering medications: rosuvastatin  5mg  daily - started 2 months ago. Tolerating well.  She was seen by Dr Edwyna yesterday - started aspirin  81mg  daily and nitroglycerin  as needed.  Dr Edwyna mentioned he would like to see patient's LDL < 60 due to diabetes and CVD  risk.   Cholesterol medications tried in the past: atorvastatin  - 10mg  twice a week - took in 2022. Noted that it was stopped due to patient preference but Mrs. Lightsey does not remember taking atorvastatin  or any medications for her cholesterol before.   Medication Management: patient brought in all her prescription and over-the-counter medications. Reviewed with patient.   She is taking Calcium  1000mg  +Magnesium  100mg  +vitamin D  600IU (amounts is each if per 3 tablets and patient is only taking 1 tablet a day). Also taking Centrum Silver Women's formula 1 tablet daily - has 300mg  calcium , magnesium  100mg  and 1000 units of vitamin D .   She does usually have either 1 milk or yogurt serving per day. Estimate she gets about 450mg  of calcium  from foods and 633mg  from her vitamins. Total estimate current calcium  per day = 1083 Needs about 200 to 400mg  more per day  Estimated daily vitamin C intake from supplements:  Centrum Silver Women's formula = 100mg  of vitamin C Vitron-C = 125mg  of calcium  About 225mg  per day    Objective:  Lab Results  Component Value Date   HGBA1C 6.6 (H) 08/09/2023    Lab Results  Component Value Date   CREATININE 0.87 08/09/2023   BUN 15 08/09/2023   NA 139 08/09/2023   K 3.9 08/09/2023   CL 99 08/09/2023   CO2 30 08/09/2023    Lab Results  Component Value Date   CHOL 191 08/09/2023   HDL 50.30 08/09/2023   LDLCALC 114 (H) 08/09/2023   LDLDIRECT 110.0 08/21/2016   TRIG 135.0 08/09/2023   CHOLHDL 4  08/09/2023    Medications Reviewed Today     Reviewed by Carla Milling, RPH-CPP (Pharmacist) on 12/05/23 at [phone]  Med List Status: <None>   Medication Order Taking? Sig Documenting Provider Last Dose Status Informant  acetaminophen  (TYLENOL ) 650 MG CR tablet [id number] Yes Take 1,300 mg by mouth every 8 (eight) hours as needed for pain. [provider]  Active Self, Pharmacy Records  albuterol  (VENTOLIN  HFA) 108 [phone] Base) MCG/ACT inhaler [id number]  Yes Inhale 2 puffs into the lungs every 6 (six) hours as needed for wheezing or shortness of breath. Daryl Setter, NP  Active   aspirin  EC 81 MG tablet [id number]  Take 1 tablet (81 mg total) by mouth daily. Swallow whole.  Patient not taking: Reported on 12/05/2023   Revankar, Jennifer SAUNDERS, MD  Active   busPIRone  (BUSPAR ) 5 MG tablet [id number] Yes Take 1 tablet (5 mg total) by mouth 2 (two) times daily. Domenica Harlene LABOR, MD  Active     Discontinued 12/05/23 0900 Calcium  Carbonate-Vit D-Min (CALCIUM  600 + MINERALS) 600-200 MG-UNIT TABS [id number] Yes Take 600 mg by mouth daily. [provider]  Active   cholecalciferol  (VITAMIN D3) 25 MCG (1000 UNIT) tablet [id number] Yes Take 1,000 Units by mouth daily.  Patient taking differently: Take 2,000 Units by mouth daily.   [provider]  Active Self, Pharmacy Records  colchicine  0.6 MG tablet [id number]  Take 1 tablet (0.6 mg total) by mouth daily. 2 tabs po once and then 1 tab every 2 hours up to max of 6 tabs in 24 hours prn gout Domenica Harlene LABOR, MD  Active   Cyanocobalamin  (B-12) 1000 MCG CAPS [id number] Yes Take 1 capsule by mouth daily at 6 (six) AM. Daryl Setter, NP  Active Self, Pharmacy Records  empagliflozin  (JARDIANCE ) 10 MG TABS tablet [id number] Yes Take 10 mg by mouth daily. [provider]  Active            Med Note JUSTINO, Chiz[place]o Trine B   Thu Oct 04, 2023  1:45 PM) [employer] Program thru 05/28/2024  famotidine  (PEPCID ) 20 MG tablet [id number]  Take 2 tablets (40 mg total) by mouth daily. Domenica Harlene LABOR, MD  Active   furosemide  (LASIX ) 40 MG tablet [id number] Yes Take 1 tablet (40 mg total) by mouth 2 (two) times daily. Domenica Harlene LABOR, MD  Active   hydrochlorothiazide  (MICROZIDE ) 12.5 MG capsule [id number] Yes TAKE 1 CAPSULE BY MOUTH DAILY Domenica Harlene LABOR, MD  Active   Iron -Vitamin C (VITRON-C) 65-125 MG TABS [id number] Yes Take 1 tablet by mouth daily. [provider]  Active   metFORMIN  (GLUCOPHAGE ) 500 MG  tablet [id number]  TAKE 1 TABLET BY MOUTH TWICE  DAILY WITH A MEAL  Patient not taking: Reported on 12/05/2023   Domenica Harlene LABOR, MD  Active   methocarbamol (ROBAXIN) 500 MG tablet [id number]  Take 500 mg by mouth at bedtime as needed for muscle spasms. Can take up to every 8 hours if needed and not having side effects.  Patient not taking: Reported on 12/05/2023   [provider]  Active   metoprolol  tartrate (LOPRESSOR ) 100 MG tablet [id number]  Take 1 tablet (100 mg total) by mouth once for 1 dose. Please take this medication 2 hours before CT Revankar, Jennifer SAUNDERS, MD  Expired 10/03/23 2359   Multiple Vitamins-Minerals (CENTRUM SILVER ULTRA WOMENS) TABS [id number] Yes Take 1 tablet by mouth daily. [provider]  Active     Discontinued 12/05/23 0848 nitroGLYCERIN  (NITROSTAT )  0.4 MG SL tablet [id number] Yes Place 1 tablet (0.4 mg total) under the tongue every 5 (five) minutes as needed for chest pain. Revankar, Jennifer SAUNDERS, MD  Active   Omega 3-6-9 Fatty Acids (OMEGA 3-6-9 PO) [id number] Yes Take 300 mg by mouth daily. Omega XL [provider]  Active   peppermint oil liquid [id number] Yes Apply topically as needed. Peppermint, wintergreen, black seed, basil and frankincense [provider]  Active   rosuvastatin  (CRESTOR ) 5 MG tablet [id number] Yes Take 1 tablet (5 mg total) by mouth daily. Domenica Harlene LABOR, MD  Active   telmisartan  (MICARDIS ) 20 MG tablet [id number] Yes TAKE 1 TABLET BY MOUTH DAILY Domenica Harlene LABOR, MD  Active               Assessment/Plan:   Diabetes: Last A1c was at goal of < 7.0 but patient would like to switch from metformin  to Jardiance  due to potential metformin  side effects and because Jardiance  has additional CKD and cardiovascular disease benefits. - Recommend to continue Jardiance  10mg  daily.  - Provided #2 sample of Libre 3+ sensors. Place first sensor today on upper left arm. Patient received the following instruction for Freestyle Libre 3+  Personal CGM:   - preparation of placement site - clean with alcohol and allow to dry.  Sensor is to only be place on back of upper arm.  Patient to rotate sides and site.   -care of sensor and site   - reminded that sensor is waterproof up to 3 feet and for 30 minutes.   - Assisted with setting up Libre 3 app on her phone  - reviewed Libre 3 app home screen and how to read and respond to trend arrows.   - reminded that when magnifying glass symbols shows up she is to confirm BG with finger stick before making any treatment decisions. .   - pt signed up for libre view in office. Linked with PCP office.  Continuous Glucose Monitor placed for 15 days. I think this might help patient identify factors that might be affecting blood glucose and if needed we could increase Jardiance  to 25mg . - Approved thru 05/28/2024 to received Jardiance  from [facility] program.   Hyperlipidemia/ASCVD Risk Reduction: LDL goal < 60 per cardiologist notes 10/03/2023 - not at goal currently - Continue rosuvastatin  5mg  daily.   Medication management:  Reviewed medications.  Discussed getting 1200 to 1500mg  calcium  per day from diet + supplements - recommended she take 2 of her current calcium  supplements per day instead of just 1 tablet (then estimated daily calcium  intake would be 1416mg  Recommended she not take any additional vitamin C or zinc  since both are in her daily multi-vitamin   Follow Up Plan: 1 to 2 weeks to review CGM  Madelin Ray, PharmD Clinical Pharmacist [facility]

## 2023-12-10 ENCOUNTER — Encounter: Payer: Self-pay | Admitting: Pharmacist

## 2023-12-10 ENCOUNTER — Encounter: Payer: Self-pay | Admitting: Cardiovascular Disease

## 2023-12-10 ENCOUNTER — Ambulatory Visit: Attending: Cardiovascular Disease | Admitting: Cardiovascular Disease

## 2023-12-10 VITALS — BP 134/68 | HR 89 | Ht 66.0 in | Wt 282.8 lb

## 2023-12-10 DIAGNOSIS — E782 Mixed hyperlipidemia: Secondary | ICD-10-CM | POA: Diagnosis not present

## 2023-12-10 DIAGNOSIS — R609 Edema, unspecified: Secondary | ICD-10-CM

## 2023-12-10 DIAGNOSIS — G4733 Obstructive sleep apnea (adult) (pediatric): Secondary | ICD-10-CM

## 2023-12-10 DIAGNOSIS — R072 Precordial pain: Secondary | ICD-10-CM | POA: Diagnosis not present

## 2023-12-10 DIAGNOSIS — I4510 Unspecified right bundle-branch block: Secondary | ICD-10-CM | POA: Diagnosis not present

## 2023-12-10 DIAGNOSIS — I10 Essential (primary) hypertension: Secondary | ICD-10-CM

## 2023-12-10 DIAGNOSIS — R079 Chest pain, unspecified: Secondary | ICD-10-CM

## 2023-12-10 MED ORDER — METOPROLOL TARTRATE 100 MG PO TABS: ORAL_TABLET | ORAL | 0 refills | Status: DC

## 2023-12-10 NOTE — Assessment & Plan Note (Signed)
 Chronic

## 2023-12-10 NOTE — Assessment & Plan Note (Signed)
 Well controlled, no changes to meds. Encouraged heart healthy diet such as the DASH diet and exercise as tolerated.

## 2023-12-10 NOTE — Assessment & Plan Note (Signed)
 History of essential hypertension with blood pressure measured today 134/68.  She is on Micardis  and hydrochlorothiazide .

## 2023-12-10 NOTE — Assessment & Plan Note (Signed)
 Hydrate and monitor

## 2023-12-10 NOTE — Patient Instructions (Addendum)
 All medication Instructions:  Your physician recommends that you continue on your current medications as directed. Please refer to the Current Medication list given to you today.  *If you need a refill on your cardiac medications before your next appointment, please call your pharmacy*  Lab Work: Your physician recommends that you have labs drawn today: BMET  If you have labs (blood work) drawn today and your tests are completely normal, you will receive your results only by: MyChart Message (if you have MyChart) OR A paper copy in the mail If you have any lab test that is abnormal or we need to change your treatment, we will call you to review the results.  Testing/Procedures: Your physician has requested that you have an echocardiogram. Echocardiography is a painless test that uses sound waves to create images of your heart. It provides your doctor with information about the size and shape of your heart and how well your heart's chambers and valves are working. This procedure takes approximately one hour. There are no restrictions for this procedure. Please do NOT wear cologne, perfume, aftershave, or lotions (deodorant is allowed). Please arrive 15 minutes prior to your appointment time.  Please note: We ask at that you not bring children with you during ultrasound (echo/ vascular) testing. Due to room size and safety concerns, children are not allowed in the ultrasound rooms during exams. Our front office staff cannot provide observation of children in our lobby area while testing is being conducted. An adult accompanying a patient to their appointment will only be allowed in the ultrasound room at the discretion of the ultrasound technician under special circumstances. We apologize for any inconvenience.   Follow-Up: At [facility], you and your health needs are our priority.  As part of our continuing mission to provide you with exceptional heart care, our providers are all part  of one team.  This team includes your primary Cardiologist (physician) and Advanced Practice Providers or APPs (Physician Assistants and Nurse Practitioners) who all work together to provide you with the care you need, when you need it.  Your next appointment:   3 month(s)  Provider:   Jon Hails, PA-C, Callie Goodrich, PA-C, Hao Meng, PA-C, Damien Braver, NP, or Katlyn West, NP         Then, Dorn Lesches, MD  will plan to see you again in 6 month(s).    We recommend signing up for the patient portal called MyChart.  Sign up information is provided on this After Visit Summary.  MyChart is used to connect with patients for Virtual Visits (Telemedicine).  Patients are able to view lab/test results, encounter notes, upcoming appointments, etc.  Non-urgent messages can be sent to your provider as well.   To learn more about what you can do with MyChart, go to [email or web address].   Other Instructions   Your cardiac CT will be scheduled at one of the below locations:     [facility] [address]  [place], [place] [address]    If scheduled at the Heart and Vascular Tower at [address], please enter the parking lot using the [address] entrance and use the FREE valet service at the patient drop-off area. Enter the buidling and check-in with registration on the main floor.  If scheduled at [facility] or [facility], please arrive 15 mins early for check-in and test prep.  There is spacious parking and easy access to the radiology department  from the [facility] entrance. Please enter here and check-in with the desk attendant.   If scheduled at [facility], please arrive 30 minutes early for check-in and test prep.  Please follow these instructions carefully (unless otherwise directed):  An IV will be required for this test and Nitroglycerin  will be given.  Hold all  erectile dysfunction medications at least 3 days (72 hrs) prior to test. (Ie viagra, cialis, sildenafil, tadalafil, etc)   On the Night Before the Test: Be sure to Drink plenty of water . Do not consume any caffeinated/decaffeinated beverages or chocolate 12 hours prior to your test. Do not take any antihistamines 12 hours prior to your test.  On the Day of the Test: Drink plenty of water  until 1 hour prior to the test. Do not eat any food 1 hour prior to test. You may take your regular medications prior to the test.  Take metoprolol  (Lopressor ) 100mg  two hours prior to test. If you take Furosemide /Hydrochlorothiazide /Spironolactone/Chlorthalidone, please HOLD on the morning of the test. Patients who wear a continuous glucose monitor MUST remove the device prior to scanning. FEMALES- please wear underwire-free bra if available, avoid dresses & tight clothing       After the Test: Drink plenty of water . After receiving IV contrast, you may experience a mild flushed feeling. This is normal. On occasion, you may experience a mild rash up to 24 hours after the test. This is not dangerous. If this occurs, you can take Benadryl  25 mg, Zyrtec , Claritin, or Allegra and increase your fluid intake. (Patients taking Tikosyn should avoid Benadryl , and may take Zyrtec , Claritin, or Allegra) If you experience trouble breathing, this can be serious. If it is severe call 911 IMMEDIATELY. If it is mild, please call our office.  We will call to schedule your test 2-4 weeks out understanding that some insurance companies will need an authorization prior to the service being performed.   For more information and frequently asked questions, please visit our website : [email or web address]  For non-scheduling related questions, please contact the cardiac imaging nurse navigator should you have any questions/concerns: Cardiac Imaging Nurse Navigators Direct Office Dial: [phone]   For  scheduling needs, including cancellations and rescheduling, please call [place], [phone].

## 2023-12-10 NOTE — Assessment & Plan Note (Signed)
 Supplement and monitor

## 2023-12-10 NOTE — Assessment & Plan Note (Signed)
 [id number] acceptable, minimize simple carbs. Increase exercise as tolerated. Continue current meds

## 2023-12-10 NOTE — Progress Notes (Signed)
 12/10/2023 [name]   March 18, 1956  [id number]  Primary Physician Domenica Harlene LABOR, MD Primary Cardiologist: Dorn JINNY Lesches MD GENI CODY MADEIRA, FSCAI  HPI:  [name] is a 68 y.o. morbidly overweight widowed Caucasian female mother of 1 child, grandmother of 2 grandchildren who was referred by Dr. Malva, her primary care doctor after being seen by Dr. Edwyna recently for cardiovascular workup.  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  Her risk factors include treated hypertension, diabetes and hyperlipidemia.  There is no family history of heart disease.  She is never had a heart attack or stroke.  She does have obstructive sleep apnea on CPAP.  She has had remote breast cancer in 1995 and has been cancer free since.  She complains of chest pain fairly frequently over the last 3 to 4 months lasting minutes at a time.   Current Meds  Medication Sig   acetaminophen  (TYLENOL ) 650 MG CR tablet Take 1,300 mg by mouth every 8 (eight) hours as needed for pain.   busPIRone  (BUSPAR ) 5 MG tablet Take 1 tablet (5 mg total) by mouth 2 (two) times daily.   Calcium  Carbonate-Vit D-Min (CALCIUM  600 + MINERALS) 600-200 MG-UNIT TABS Take 600 mg by mouth daily.   cholecalciferol  (VITAMIN D3) 25 MCG (1000 UNIT) tablet Take 1,000 Units by mouth daily. (Patient taking differently: Take 2,000 Units by mouth daily.)   Cyanocobalamin  (B-12) 1000 MCG CAPS Take 1 capsule by mouth daily at 6 (six) AM.   empagliflozin  (JARDIANCE ) 10 MG TABS tablet Take 10 mg by mouth daily.   famotidine  (PEPCID ) 20 MG tablet Take 2 tablets (40 mg total) by mouth daily.   furosemide  (LASIX ) 40 MG tablet Take 1 tablet (40 mg total) by mouth 2 (two) times daily.   hydrochlorothiazide  (MICROZIDE ) 12.5 MG capsule TAKE 1 CAPSULE BY MOUTH DAILY   Iron -Vitamin C (VITRON-C) 65-125 MG TABS Take 1 tablet by mouth  daily.   Multiple Vitamins-Minerals (CENTRUM SILVER ULTRA WOMENS) TABS Take 1 tablet by mouth daily.   nitroGLYCERIN  (NITROSTAT ) 0.4 MG SL tablet Place 1 tablet (0.4 mg total) under the tongue every 5 (five) minutes as needed for chest pain.   Omega 3-6-9 Fatty Acids (OMEGA 3-6-9 PO) Take 300 mg by mouth daily. Omega XL   peppermint oil liquid Apply topically as needed. Peppermint, wintergreen, black seed, basil and frankincense   rosuvastatin  (CRESTOR ) 5 MG tablet Take 1 tablet (5 mg total) by mouth daily.   telmisartan  (MICARDIS ) 20 MG tablet TAKE 1 TABLET BY MOUTH DAILY     Allergies  Allergen Reactions   Allopurinol  Hives   Fluoxetine  Hives   Mucinex  [Guaifenesin  Er] Shortness Of Breath   Penicillins Hives, Other (See Comments) and Itching    Has patient had a PCN reaction causing immediate rash, facial/tongue/throat swelling, SOB or lightheadedness with hypotension: no  Has patient had a PCN reaction causing severe rash involving mucus membranes or skin necrosis: no  Has patient had a PCN reaction that required hospitalization no  Has patient had a PCN reaction occurring within the last 10 years: no  If all of the above answers are NO, then may proceed with Cephalosporin use.  Product containing penicillin (product)   Pregabalin  Swelling   Amitriptyline  Other (See Comments)    felt weird, fatigue, dizziness  amitriptyline    Bupropion  Other (See  Comments)    Uncontrolled movements of tongue, hand   Escitalopram Oxalate Other (See Comments)    Nausea and hypersalivation.  escitalopram oxalate   Ozempic  (0.25 Or 0.5 Mg-Dose) [Semaglutide (0.25 Or 0.5mg -Dos)] Other (See Comments)    Abdominal pain   Prozac  [Fluoxetine  Hcl] Hives   Tirzepatide  Other (See Comments)    Social History   Socioeconomic History   Marital status: Widowed    Spouse name: Not on file   Number of children: 1   Years of education: Not on file   Highest education level: 12th grade   Occupational History   Occupation: ACCOUNTING    Employer: [employer]  Tobacco Use   Smoking status: Never   Smokeless tobacco: Never  Vaping Use   Vaping status: Never Used  Substance and Sexual Activity   Alcohol use: Not Currently   Drug use: No   Sexual activity: Not Currently  Other Topics Concern   Not on file  Social History Narrative   Not on file   Social Drivers of Health   Financial Resource Strain: Low Risk  (08/22/2023)   Overall Financial Resource Strain (CARDIA)    Difficulty of Paying Living Expenses: Not very hard  Food Insecurity: No Food Insecurity (08/22/2023)   Hunger Vital Sign    Worried About Running Out of Food in the Last Year: Never true    Ran Out of Food in the Last Year: Never true  Transportation Needs: No Transportation Needs (08/22/2023)   PRAPARE - Administrator, Civil Service (Medical): No    Lack of Transportation (Non-Medical): No  Physical Activity: Inactive (08/22/2023)   Exercise Vital Sign    Days of Exercise per Week: 0 days    Minutes of Exercise per Session: 10 min  Stress: No Stress Concern Present (08/22/2023)   [employer] of Occupational Health - Occupational Stress Questionnaire    Feeling of Stress : Only a little  Social Connections: Moderately Integrated (08/22/2023)   Social Connection and Isolation Panel    Frequency of Communication with Friends and Family: More than three times a week    Frequency of Social Gatherings with Friends and Family: Three times a week    Attends Religious Services: More than 4 times per year    Active Member of Clubs or Organizations: Yes    Attends Banker Meetings: More than 4 times per year    Marital Status: Widowed  Intimate Partner Violence: Not At Risk (12/20/2022)   Humiliation, Afraid, Rape, and Kick questionnaire    Fear of Current or Ex-Partner: No    Emotionally Abused: No    Physically Abused: No    Sexually Abused: No     Review of  Systems: General: negative for chills, fever, night sweats or weight changes.  Cardiovascular: negative for chest pain, dyspnea on exertion, edema, orthopnea, palpitations, paroxysmal nocturnal dyspnea or shortness of breath Dermatological: negative for rash Respiratory: negative for cough or wheezing Urologic: negative for hematuria Abdominal: negative for nausea, vomiting, diarrhea, bright red blood per rectum, melena, or hematemesis Neurologic: negative for visual changes, syncope, or dizziness All other systems reviewed and are otherwise negative except as noted above.    Blood pressure 134/68, pulse 89, height 5' 6 (1.676 m), weight 282 lb 12.8 oz (128.3 kg), SpO2 98%.  General appearance: alert and no distress Neck: no adenopathy, no carotid bruit, no JVD, supple, symmetrical, trachea midline, and thyroid  not enlarged, symmetric, no tenderness/mass/nodules Lungs: clear to auscultation  bilaterally Heart: regular rate and rhythm, S1, S2 normal, no murmur, click, rub or gallop Extremities: extremities normal, atraumatic, no cyanosis or edema Pulses: 2+ and symmetric Skin: Skin color, texture, turgor normal. No rashes or lesions Neurologic: Grossly normal  EKG not performed today      ASSESSMENT AND PLAN:   Essential hypertension History of essential hypertension with blood pressure measured today 134/68.  She is on Micardis  and hydrochlorothiazide .  Edema History of chronic lower extreme edema on furosemide .  Chest pain of uncertain etiology Patient complains of chest pain over the last 3 to 4 months occurring fairly frequently lasting for minutes at a time.  She does have positive cardiac risk factors.  I am going to get a 2D echo and a coronary CTA to further evaluate.  Morbid obesity (HCC) BMI of 45.  Patient needs a knee replacement which can occur until she loses weight.  She has made lifestyle modification.  She apparently was on a GLP-1 agonist which she could not  tolerate.  OSA (obstructive sleep apnea) History of obstructive sleep apnea on CPAP     Dorn DOROTHA Lesches MD [facility], [facility] 12/10/2023 3:01 PM

## 2023-12-10 NOTE — Assessment & Plan Note (Addendum)
 History of hyperlipidemia on low-dose statin therapy with lipid profile performed 08/09/2023 revealing total cholesterol 191, LDL 114 and HDL 50.

## 2023-12-10 NOTE — Assessment & Plan Note (Signed)
 BMI of 45.  Patient needs a knee replacement which can occur until she loses weight.  She has made lifestyle modification.  She apparently was on a GLP-1 agonist which she could not tolerate.

## 2023-12-10 NOTE — Assessment & Plan Note (Signed)
 Patient complains of chest pain over the last 3 to 4 months occurring fairly frequently lasting for minutes at a time.  She does have positive cardiac risk factors.  I am going to get a 2D echo and a coronary CTA to further evaluate.

## 2023-12-10 NOTE — Assessment & Plan Note (Signed)
 History of obstructive sleep apnea on CPAP.

## 2023-12-10 NOTE — Assessment & Plan Note (Signed)
 History of chronic lower extreme edema on furosemide .

## 2023-12-11 ENCOUNTER — Ambulatory Visit: Payer: Self-pay | Admitting: Cardiovascular Disease

## 2023-12-11 LAB — BASIC METABOLIC PANEL WITH GFR
BUN/Creatinine Ratio: 17 (ref 12–28)
BUN: 15 mg/dL (ref 8–27)
CO2: 25 mmol/L (ref 20–29)
Calcium: 9.8 mg/dL (ref 8.7–10.3)
Chloride: 97 mmol/L (ref 96–106)
Creatinine, Ser: 0.86 mg/dL (ref 0.57–1.00)
Glucose: 103 mg/dL — ABNORMAL HIGH (ref 70–99)
Potassium: 4.6 mmol/L (ref 3.5–5.2)
Sodium: 141 mmol/L (ref 134–144)
eGFR: 73 mL/min/1.73 (ref >59)

## 2023-12-12 ENCOUNTER — Other Ambulatory Visit (INDEPENDENT_AMBULATORY_CARE_PROVIDER_SITE_OTHER): Admitting: Pharmacist

## 2023-12-12 DIAGNOSIS — E1169 Type 2 diabetes mellitus with other specified complication: Secondary | ICD-10-CM

## 2023-12-12 NOTE — Progress Notes (Signed)
 12/12/2023 Name: [name] MRN: [id number] DOB: [birth year]  Chief Complaint  Patient presents with   Diabetes    [name] is a 68 y.o. year old female. Patient was seen in the office for a visit today.   They were referred to the pharmacist by their PCP for assistance in managing medication access.   Subjective:  Medication Access/Adherence  Current Pharmacy:  [id number] - [place], [place] - [address] [place] [address] Phone: [phone] Fax: [phone]  [facility] - [place], [place] - [address] [place]  [address] Phone: [phone] Fax: [phone]  [facility] [address] [place] [address] Phone: [phone] Fax: [phone]   Patient reports affordability concerns with their medications: Yes  Patient reports access/transportation concerns to their pharmacy: No  Patient reports adherence concerns with their medications:  No      Diabetes:  Current medications: Jardiance  10mg  once a daily - started about 8 weeks ago - Approved to get thru [facility] thru 05/28/2024  Medications tried in the past: Mounjaro  and ozempic  - caused abdominal pain and burning; metfomrin - stopped due to patient's concerns with side effects.   Home blood glucose readings: - has been wearing Continuous Glucose Monitor for the last 2 weeks but sensor has fallen off 2 times.    CGM Documentation:  Days Worn: 14 (recommend 14 days) % Time CGM is active: 40% (goal >=70%) Average Glucose: 115 mg/dL Glucose Management Indicator: 6.1% Glucose Variability: 13.5% (goal <36%) Time in Range:  - Time above range >250: 0% (typical goal: <5%) - Time above range 181-250: 0% (typical goal <20%) - Time in range 70-180 100% (typical goal >=70%) - Time below range 54-69: 0% (typical goal <4%) -  though there was a brief time when she changed sensor that reading were around 59 for about 3 hours but she checked with fingerstick blood glucose monitor and blood glucose was not actually that low. Suspect it just took longer for sensor to warm up / stabilize and these were not actually lows.  - Time below range: 0%  (typical goal <1%)     Hyperlipidemia/ASCVD Risk Reduction  Current lipid lowering medications: rosuvastatin  5mg  daily - started 2 months ago. Today she reports she has had fatigue when she take rosuvastatin  and she started taking it every other day a few days agao.  She was seen by Dr Edwyna yesterday - started aspirin  81mg  daily and nitroglycerin  as needed.  Dr Edwyna mentioned he would like to see patient's LDL < 60 due to diabetes and CVD risk.   Cholesterol medications tried in the past: atorvastatin  - 10mg  twice a week - took in 2022. Noted that it was stopped due to patient preference but [name] does not remember taking atorvastatin  or any medications for her cholesterol before.     Objective:  Lab Results  Component Value Date   HGBA1C 6.6 (H) 08/09/2023    Lab Results  Component Value Date   CREATININE 0.86 12/10/2023   BUN 15 12/10/2023   NA 141 12/10/2023   K 4.6 12/10/2023   CL 97 12/10/2023   CO2 25 12/10/2023    Lab Results  Component Value Date   CHOL 191 08/09/2023   HDL 50.30 08/09/2023   LDLCALC 114 (H) 08/09/2023   LDLDIRECT 110.0 08/21/2016   TRIG  135.0 08/09/2023   CHOLHDL 4 08/09/2023    Medications Reviewed Today     Reviewed by Carla Milling, RPH-CPP (Pharmacist) on 12/12/23 at 1704  Med List Status: <None>   Medication Order Taking? Sig Documenting Provider Last Dose Status Informant  acetaminophen  (TYLENOL ) 650 MG CR tablet [id number]  Take 1,300 mg by mouth every 8 (eight) hours as needed for pain. [provider]  Active Self, Pharmacy Records  busPIRone  (BUSPAR ) 5 MG tablet [id number]  Take 1 tablet (5 mg  total) by mouth 2 (two) times daily. Domenica Harlene LABOR, MD  Active   Calcium  Carbonate-Vit D-Min (CALCIUM  600 + MINERALS) 600-200 MG-UNIT TABS [id number]  Take 600 mg by mouth daily. [provider]  Active   cholecalciferol  (VITAMIN D3) 25 MCG (1000 UNIT) tablet [id number]  Take 1,000 Units by mouth daily.  Patient taking differently: Take 2,000 Units by mouth daily.   [provider]  Active Self, Pharmacy Records  Cyanocobalamin  (B-12) 1000 MCG CAPS [id number]  Take 1 capsule by mouth daily at 6 (six) AM. Daryl Setter, NP  Active Self, Pharmacy Records  empagliflozin  (JARDIANCE ) 10 MG TABS tablet [id number]  Take 10 mg by mouth daily. [provider]  Active            Med Note JUSTINO, Thaison Kolodziejski B   Thu Oct 04, 2023  1:45 PM) [facility] thru 05/28/2024  famotidine  (PEPCID ) 20 MG tablet [id number]  Take 2 tablets (40 mg total) by mouth daily. Domenica Harlene LABOR, MD  Active   furosemide  (LASIX ) 40 MG tablet [id number]  Take 1 tablet (40 mg total) by mouth 2 (two) times daily. Domenica Harlene LABOR, MD  Active   hydrochlorothiazide  (MICROZIDE ) 12.5 MG capsule [id number] Yes TAKE 1 CAPSULE BY MOUTH DAILY Domenica Harlene LABOR, MD  Active   Iron -Vitamin C (VITRON-C) 65-125 MG TABS [id number] Yes Take 1 tablet by mouth daily. [provider]  Active   metoprolol  tartrate (LOPRESSOR ) 100 MG tablet [id number]  Take 1 tablet (100mg ) TWO hours prior to CT scan Court Dorn PARAS, MD  Consider Medication Status and Discontinue (Completed Course)   Multiple Vitamins-Minerals (CENTRUM SILVER ULTRA WOMENS) TABS [id number] Yes Take 1 tablet by mouth daily. [provider]  Active   nitroGLYCERIN  (NITROSTAT ) 0.4 MG SL tablet [id number] Yes Place 1 tablet (0.4 mg total) under the tongue every 5 (five) minutes as needed for chest pain. Revankar, Jennifer SAUNDERS, MD  Active   Omega 3-6-9 Fatty Acids (OMEGA 3-6-9 PO) [id number] Yes Take 300 mg by mouth daily. Omega XL [provider]  Active    peppermint oil liquid [id number] Yes Apply topically as needed. Peppermint, wintergreen, black seed, basil and frankincense [provider]  Active   rosuvastatin  (CRESTOR ) 5 MG tablet [id number] Yes Take 1 tablet (5 mg total) by mouth daily.  Patient taking differently: Take 5 mg by mouth every other day.   Domenica Harlene LABOR, MD  Active   telmisartan  (MICARDIS ) 20 MG tablet [id number] Yes TAKE 1 TABLET BY MOUTH DAILY Domenica Harlene LABOR, MD  Active               Assessment/Plan:   Diabetes: Last A1c was at goal of < 7.0 but patient would like to switch from metformin  to Jardiance  due to potential metformin  side effects and because Jardiance  has additional CKD and cardiovascular disease benefits. - Recommend to continue Jardiance  10mg  daily.  - Provided another sample of Libre 3+ sensor and an overpatch to use.  -  Approved thru 05/28/2024 to received Jardiance  from [facility] program.   Hyperlipidemia/ASCVD Risk Reduction: LDL goal < 60 per cardiologist notes 10/03/2023 - not at goal currently - Continue rosuvastatin  5mg  every other day - but she will take to Dr Domenica tomorrow about alternative - consider either pravastatin 40mg  daily, pitavastatin 4mg  or atorvastatin  20mg  daily.   Follow Up Plan: 2 to 4 weeks to review CGM  Madelin Ray, PharmD Clinical Pharmacist [facility]

## 2023-12-13 ENCOUNTER — Encounter: Payer: Self-pay | Admitting: Pharmacist

## 2023-12-13 ENCOUNTER — Encounter: Payer: Self-pay | Admitting: Family Medicine

## 2023-12-13 ENCOUNTER — Ambulatory Visit (INDEPENDENT_AMBULATORY_CARE_PROVIDER_SITE_OTHER): Admitting: Family Medicine

## 2023-12-13 VITALS — BP 122/82 | HR 67 | Resp 16 | Ht 66.0 in | Wt 280.2 lb

## 2023-12-13 DIAGNOSIS — Z6841 Body Mass Index (BMI) 40.0 and over, adult: Secondary | ICD-10-CM

## 2023-12-13 DIAGNOSIS — M109 Gout, unspecified: Secondary | ICD-10-CM | POA: Diagnosis not present

## 2023-12-13 DIAGNOSIS — Z789 Other specified health status: Secondary | ICD-10-CM

## 2023-12-13 DIAGNOSIS — E559 Vitamin D deficiency, unspecified: Secondary | ICD-10-CM

## 2023-12-13 DIAGNOSIS — E1169 Type 2 diabetes mellitus with other specified complication: Secondary | ICD-10-CM | POA: Diagnosis not present

## 2023-12-13 DIAGNOSIS — Z7984 Long term (current) use of oral hypoglycemic drugs: Secondary | ICD-10-CM

## 2023-12-13 DIAGNOSIS — E782 Mixed hyperlipidemia: Secondary | ICD-10-CM

## 2023-12-13 DIAGNOSIS — I10 Essential (primary) hypertension: Secondary | ICD-10-CM | POA: Diagnosis not present

## 2023-12-13 DIAGNOSIS — E669 Obesity, unspecified: Secondary | ICD-10-CM | POA: Diagnosis not present

## 2023-12-13 DIAGNOSIS — I159 Secondary hypertension, unspecified: Secondary | ICD-10-CM

## 2023-12-13 MED ORDER — DOXYCYCLINE HYCLATE 100 MG PO TABS: 100.0000 mg | ORAL_TABLET | Freq: Two times a day (BID) | ORAL | 0 refills | Status: DC

## 2023-12-13 MED ORDER — NITROGLYCERIN 0.4 MG SL SUBL: 0.4000 mg | SUBLINGUAL_TABLET | SUBLINGUAL | 1 refills | Status: DC | PRN

## 2023-12-13 MED ORDER — BUSPIRONE HCL 5 MG PO TABS: 5.0000 mg | ORAL_TABLET | Freq: Three times a day (TID) | ORAL | 1 refills | Status: DC

## 2023-12-13 MED ORDER — FUROSEMIDE 40 MG PO TABS: 40.0000 mg | ORAL_TABLET | Freq: Two times a day (BID) | ORAL | 3 refills | Status: DC

## 2023-12-13 NOTE — Patient Instructions (Signed)

## 2023-12-14 LAB — COMPREHENSIVE METABOLIC PANEL WITH GFR
ALT: 19 U/L (ref 0–35)
AST: 23 U/L (ref 0–37)
Albumin: 4.4 g/dL (ref 3.5–5.2)
Alkaline Phosphatase: 71 U/L (ref 39–117)
BUN: 15 mg/dL (ref 6–23)
CO2: 30 meq/L (ref 19–32)
Calcium: 9.7 mg/dL (ref 8.4–10.5)
Chloride: 97 meq/L (ref 96–112)
Creatinine, Ser: 1.01 mg/dL (ref 0.40–1.20)
GFR: 56.85 mL/min — ABNORMAL LOW (ref >60.00)
Glucose, Bld: 103 mg/dL — ABNORMAL HIGH (ref 70–99)
Potassium: 3.8 meq/L (ref 3.5–5.1)
Sodium: 139 meq/L (ref 135–145)
Total Bilirubin: 0.5 mg/dL (ref 0.2–1.2)
Total Protein: 7.8 g/dL (ref 6.0–8.3)

## 2023-12-14 LAB — CBC WITH DIFFERENTIAL/PLATELET
Basophils Absolute: 0.1 K/uL (ref 0.0–0.1)
Basophils Relative: 0.7 % (ref 0.0–3.0)
Eosinophils Absolute: 0.1 K/uL (ref 0.0–0.7)
Eosinophils Relative: 1.3 % (ref 0.0–5.0)
HCT: 37.3 % (ref 36.0–46.0)
Hemoglobin: 12.2 g/dL (ref 12.0–15.0)
Lymphocytes Relative: 30.4 % (ref 12.0–46.0)
Lymphs Abs: 2.8 K/uL (ref 0.7–4.0)
MCHC: 32.6 g/dL (ref 30.0–36.0)
MCV: 83.7 fl (ref 78.0–100.0)
Monocytes Absolute: 0.9 K/uL (ref 0.1–1.0)
Monocytes Relative: 9.9 % (ref 3.0–12.0)
Neutro Abs: 5.2 K/uL (ref 1.4–7.7)
Neutrophils Relative %: 57.7 % (ref 43.0–77.0)
Platelets: 279 K/uL (ref 150.0–400.0)
RBC: 4.46 Mil/uL (ref 3.87–5.11)
RDW: 17.7 % — ABNORMAL HIGH (ref 11.5–15.5)
WBC: 9.1 K/uL (ref 4.0–10.5)

## 2023-12-14 LAB — MICROALBUMIN / CREATININE URINE RATIO
Creatinine,U: 45.1 mg/dL
Microalb Creat Ratio: UNDETERMINED mg/g (ref 0.0–30.0)
Microalb, Ur: <0.7 mg/dL

## 2023-12-14 LAB — HEMOGLOBIN A1C: Hgb A1c MFr Bld: 6.7 % — ABNORMAL HIGH (ref 4.6–6.5)

## 2023-12-14 LAB — LIPID PANEL
Cholesterol: 141 mg/dL (ref 0–200)
HDL: 46.5 mg/dL (ref >39.00)
LDL Cholesterol: 66 mg/dL (ref 0–99)
NonHDL: 94.86
Total CHOL/HDL Ratio: 3
Triglycerides: 143 mg/dL (ref 0.0–149.0)
VLDL: 28.6 mg/dL (ref 0.0–40.0)

## 2023-12-14 LAB — URIC ACID: Uric Acid, Serum: 5.7 mg/dL (ref 2.4–7.0)

## 2023-12-14 LAB — VITAMIN D 25 HYDROXY (VIT D DEFICIENCY, FRACTURES): VITD: 62.49 ng/mL (ref 30.00–100.00)

## 2023-12-14 LAB — T4, FREE: Free T4: 0.77 ng/dL (ref 0.60–1.60)

## 2023-12-14 LAB — TSH: TSH: 0.62 u[IU]/mL (ref 0.35–5.50)

## 2023-12-16 ENCOUNTER — Encounter: Payer: Self-pay | Admitting: Family Medicine

## 2023-12-16 ENCOUNTER — Ambulatory Visit: Payer: Self-pay | Admitting: Family Medicine

## 2023-12-16 NOTE — Progress Notes (Signed)
 Subjective:    Patient ID: [name], female    DOB: 09-13-1955, 68 y.o.   MRN: [id number]  Chief Complaint  Patient presents with   Medical Management of Chronic Issues    Patient presents today follow-up.    HPI Discussed the use of AI scribe software for clinical note transcription with the patient, who gave verbal consent to proceed.  History of Present Illness [name] is a 67 year old female who presents with persistent throat pain and excessive mucus production.  She underwent parathyroid  surgery in 2003 or 2004 due to a nodule, which led to temporary vocal cord issues, causing her to lose her voice for several weeks. She could whisper but not speak, although it was not painful at the time.  Recently, she has been experiencing persistent throat pain and excessive mucus production, which worsened after an illness in the week of July 4th. She initially suspected COVID-19 but tested negative. Since then, she has been coughing up large amounts of phlegm, which she finds embarrassing, especially in public settings. The throat pain was severe during the illness and has not fully resolved.  She occasionally experiences difficulty swallowing and heartburn. The mucus production worsens when she lies down. She is currently taking Zyrtec  and Flonase  to manage symptoms and has tried various remedies including honey and Vicks.  She has a history of high cholesterol and has experienced significant fatigue and weakness with rosuvastatin , leading her to take it every other day. She has since stopped it due to intolerable side effects. She is also managing her diabetes with Jardiance . She is currently taking famotidine  for indigestion, which she finds effective in managing her symptoms. She also uses a topical preparation containing peppermint oil, wintergreen, black seed, basil, and frankincense for aches and pains, which she finds helpful.  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 No current fever, flu-like symptoms, chest pain, or shortness of breath.    Past Medical History:  Diagnosis Date   Allergic rhinitis 07/05/2007   Qualifier: Diagnosis of   By: Lorane Browning         Allergic urticaria 03/27/2016   Allergy    Anemia 07/17/2016   Anxiety    Arthralgia 12/03/2016   Asthma    seasonal   Asthma with acute exacerbation 07/14/2014   ATTENTION DEFICIT DISORDER, INATTENTIVE TYPE 09/22/2009   Qualifier: Diagnosis of   By: Mahlon MD, Comer      Replacing diagnoses that were inactivated after the 08/28/22 regulatory import     Baker's cyst of knee, right 02/26/2023   Blood transfusion without reported diagnosis    Breast cancer (HCC)    right   Colon polyps    CPAP (continuous positive airway pressure) dependence    DDD (degenerative disc disease), cervical 05/04/2021   Depression    Depression with anxiety 06/25/2006   Qualifier: Diagnosis of   By: Nicholaus Channel         Dermographia 03/27/2016   Diverticulitis of colon without hemorrhage 09/21/2014   Ear canal dryness 08/03/2011   Edema    Educated about COVID-19 virus infection 04/19/2020   Elevated sed rate 04/03/2022   Essential hypertension 06/25/2006   Qualifier: Diagnosis of   By: Nicholaus Channel         Family history of breast cancer in first degree relative    Fatigue 08/03/2011   Fibromyalgia    Gastroesophageal reflux disease without esophagitis 06/25/2006   Centricity Description: GERD  Qualifier: Diagnosis  of   By: Nicholaus Channel      Centricity Description: ESOPHAGEAL REFLUX  Qualifier: Diagnosis of   By: Tish MD, Elsie Overall testing 05/15/2016   Negative for mutations within any of 32 genes on the Custom Cancer Panel through [employer].  No variants of uncertain significance (VUSes were found).  This Custom Panel offered by [facility] Evert, MD) includes sequencing and/or deletion duplication testing of the following 34 genes: APC,  ATM, AXIN2, BARD1, BMPR1A, BRCA1, BRCA2, BRIP1, CDH1, CDK4, CDKN2A, CHEK2, EPCAM, F   GERD (gastroesophageal reflux disease)    Gout of big toe 03/10/2013   Allergic to Allopurinol       History of breast cancer 03/27/2016   History of colonic polyps 08/14/2011   03/2006 - diminutive adenoma removed Ollen)  08/14/2011 - diminutive cecal adenoma removed  IMO SNOMED Dx Update Oct 2024     Hyperglycemia 03/05/2017   Hyperlipidemia 08/21/2016   no meds   Hypertension    controlled by medications   Insomnia 02/15/2009   Qualifier: Diagnosis of   By: Mahlon MD, Comer         Joint pain    Left arm pain 01/21/2021   Low back pain 01/11/2022   Memory changes 04/19/2020   Morbid obesity (HCC) 09/21/2010   Neck pain 05/02/2021   Neuromuscular disorder (HCC)    Fibromyalgia   Obesity    OSA (obstructive sleep apnea) 04/23/2012   12/13 Mod- AHI 15/h, corrected by CPAP 8 cm     Osteoarthritis    RA   Other peripheral vertigo, unspecified ear 10/27/2019   PAC (premature atrial contraction) 09/21/2010   Palpitations 09/21/2010   Peripheral neuropathy 04/19/2020   Gabapentin  caused edema     PONV (postoperative nausea and vomiting)    occasional   Pre-diabetes    Preventative health care 10/08/2012   Rheumatoid arthritis (HCC) 06/24/2015   History of diagnosis of RA is questionable. Not highly symptomatic at this time     Right hand pain 11/13/2022   Right knee pain 12/06/2016   RLS (restless legs syndrome) 06/07/2017   Spondylolisthesis of lumbar region 09/21/2021   TMJ arthralgia 04/19/2020   Type 2 diabetes mellitus with obesity (HCC) 12/10/2011   dx'd 11/2011     Urinary incontinence 10/27/2019   Vertigo 03/14/2018   Viral meningitis    Vitamin D  deficiency 03/11/2008   Qualifier: Diagnosis of   By: Mahlon MD, Comer          Past Surgical History:  Procedure Laterality Date   ABDOMINAL HYSTERECTOMY  1998   ADENOIDECTOMY     BACK SURGERY     09/21/21   BREAST  EXCISIONAL BIOPSY Left 1993   benign   BREAST SURGERY     Tran flap due to breast cancer   CESAREAN SECTION  1988   COLONOSCOPY  08/14/2011   HAND SURGERY     dog bite, right hand   HAND SURGERY     trauma, left hand   KNEE ARTHROSCOPY     bilateral   left knee replacement  2010   LUMBAR WOUND DEBRIDEMENT N/A 10/07/2021   Procedure: LUMBAR WOUND DEBRIDEMENT/REVISION;  Surgeon: Gillie Duncans, MD;  Location: [facility] OR;  Service: Neurosurgery;  Laterality: N/A;  3C/RM 21 to follow Dr Lanis   MASTECTOMY  01/13/1994   Right breast   parathyroid  resection     PARATHYROIDECTOMY     POLYPECTOMY  rectal abscess     SEPTOPLASTY  1980   SHOULDER ARTHROSCOPY Right 11/09/2015   Procedure: ARTHROSCOPY SHOULDER-acromioplasty, distal clavicle resection and debridement;  Surgeon: Maude Herald, MD;  Location: [facility] OR;  Service: Orthopedics;  Laterality: Right;   shoulder arthroscopy     rotator cuff repair   TOE SURGERY Right    paronychia and adenoma removed   TONSILLECTOMY     TOTAL KNEE ARTHROPLASTY  06/18/2008   Daldorf   TUMOR REMOVAL  1982   , scalp    Family History  Problem Relation Age of Onset   Emphysema Father    Lung cancer Father        lung ca dx 71   Other Father        prostate issues   Diabetes Father    Breast cancer Maternal Aunt        dx late 61s   Colon cancer Maternal Aunt        dx 47s   Colon polyps Maternal Aunt    Prostate cancer Maternal Grandfather 41       d. 85y   Heart disease Paternal Grandfather    Heart attack Paternal Grandfather        d. 50y   Diabetes Paternal Grandfather    Asthma Daughter        seasonal   Arthritis Maternal Grandmother    Aneurysm Maternal Grandmother        d. brain aneurysm at 5   Arthritis Mother    Colon polyps Mother    Hypertension Mother    Sleep apnea Mother    Multiple sclerosis Sister    Breast cancer Sister 49       L IDC and DCIS; ER/PR+, Her2-   Fibrocystic breast disease Sister     Arthritis/Rheumatoid Sister    Breast cancer Sister    Cancer Maternal Uncle        d. mouth cancer at younger age; smoker   Other Paternal Uncle        muscle issues - couldn't walk or talk; d. 20y   Cancer Maternal Aunt        lymphoma, dx 38s   Ovarian cancer Maternal Aunt        dx 9s; d. late 62s   Lung cancer Maternal Uncle        d. 21y; former smoker   Throat cancer Cousin        maternal 1st cousin; smoker   Breast cancer Cousin 25       maternal 1st cousin   Other Paternal Uncle        prostate issues   Breast cancer Cousin        paternal 1st cousin dx late 38s   Throat cancer Cousin        maternal 1st cousin; used SL tobacco   Prostate cancer Cousin        maternal 1st cousin dx 57s   Esophageal cancer Neg Hx    Rectal cancer Neg Hx    Stomach cancer Neg Hx     Social History   Socioeconomic History   Marital status: Widowed    Spouse name: Not on file   Number of children: 1   Years of education: Not on file   Highest education level: 12th grade  Occupational History   Occupation: ACCOUNTING    Employer: [employer]  Tobacco Use   Smoking status: Never   Smokeless tobacco: Never  Vaping  Use   Vaping status: Never Used  Substance and Sexual Activity   Alcohol use: Not Currently   Drug use: No   Sexual activity: Not Currently  Other Topics Concern   Not on file  Social History Narrative   Not on file   Social Drivers of Health   Financial Resource Strain: Low Risk  (12/13/2023)   Overall Financial Resource Strain (CARDIA)    Difficulty of Paying Living Expenses: Not hard at all  Food Insecurity: No Food Insecurity (12/13/2023)   Hunger Vital Sign    Worried About Running Out of Food in the Last Year: Never true    Ran Out of Food in the Last Year: Never true  Transportation Needs: No Transportation Needs (12/13/2023)   PRAPARE - Administrator, Civil Service (Medical): No    Lack of Transportation (Non-Medical): No  Physical  Activity: Inactive (12/13/2023)   Exercise Vital Sign    Days of Exercise per Week: 0 days    Minutes of Exercise per Session: Not on file  Stress: No Stress Concern Present (12/13/2023)   [employer] of Occupational Health - Occupational Stress Questionnaire    Feeling of Stress: Only a little  Social Connections: Moderately Integrated (12/13/2023)   Social Connection and Isolation Panel    Frequency of Communication with Friends and Family: More than three times a week    Frequency of Social Gatherings with Friends and Family: Twice a week    Attends Religious Services: More than 4 times per year    Active Member of [employer] or Organizations: Yes    Attends Banker Meetings: More than 4 times per year    Marital Status: Widowed  Intimate Partner Violence: Not At Risk (12/20/2022)   Humiliation, Afraid, Rape, and Kick questionnaire    Fear of Current or Ex-Partner: No    Emotionally Abused: No    Physically Abused: No    Sexually Abused: No    Outpatient Medications Prior to Visit  Medication Sig Dispense Refill   acetaminophen  (TYLENOL ) 650 MG CR tablet Take 1,300 mg by mouth every 8 (eight) hours as needed for pain.     Calcium  Carbonate-Vit D-Min (CALCIUM  600 + MINERALS) 600-200 MG-UNIT TABS Take 600 mg by mouth daily.     cholecalciferol  (VITAMIN D3) 25 MCG (1000 UNIT) tablet Take 1,000 Units by mouth daily. (Patient taking differently: Take 2,000 Units by mouth daily.)     Cyanocobalamin  (B-12) 1000 MCG CAPS Take 1 capsule by mouth daily at 6 (six) AM.     empagliflozin  (JARDIANCE ) 10 MG TABS tablet Take 10 mg by mouth daily.     famotidine  (PEPCID ) 20 MG tablet Take 2 tablets (40 mg total) by mouth daily.     hydrochlorothiazide  (MICROZIDE ) 12.5 MG capsule TAKE 1 CAPSULE BY MOUTH DAILY 100 capsule 2   Iron -Vitamin C (VITRON-C) 65-125 MG TABS Take 1 tablet by mouth daily.     metoprolol  tartrate (LOPRESSOR ) 100 MG tablet Take 1 tablet (100mg ) TWO hours prior to CT  scan 1 tablet 0   Multiple Vitamins-Minerals (CENTRUM SILVER ULTRA WOMENS) TABS Take 1 tablet by mouth daily.     Omega 3-6-9 Fatty Acids (OMEGA 3-6-9 PO) Take 300 mg by mouth daily. Omega XL     peppermint oil liquid Apply topically as needed. Peppermint, wintergreen, black seed, basil and frankincense     telmisartan  (MICARDIS ) 20 MG tablet TAKE 1 TABLET BY MOUTH DAILY 100 tablet 2   busPIRone  (BUSPAR )  5 MG tablet Take 1 tablet (5 mg total) by mouth 2 (two) times daily. 180 tablet 0   furosemide  (LASIX ) 40 MG tablet Take 1 tablet (40 mg total) by mouth 2 (two) times daily. 180 tablet 3   nitroGLYCERIN  (NITROSTAT ) 0.4 MG SL tablet Place 1 tablet (0.4 mg total) under the tongue every 5 (five) minutes as needed for chest pain. 25 tablet 1   rosuvastatin  (CRESTOR ) 5 MG tablet Take 1 tablet (5 mg total) by mouth daily. (Patient taking differently: Take 5 mg by mouth every other day.) 90 tablet 3   No facility-administered medications prior to visit.    Allergies  Allergen Reactions   Allopurinol  Hives   Fluoxetine  Hives   Mucinex  [Guaifenesin  Er] Shortness Of Breath   Penicillins Hives, Other (See Comments) and Itching    Has patient had a PCN reaction causing immediate rash, facial/tongue/throat swelling, SOB or lightheadedness with hypotension: no  Has patient had a PCN reaction causing severe rash involving mucus membranes or skin necrosis: no  Has patient had a PCN reaction that required hospitalization no  Has patient had a PCN reaction occurring within the last 10 years: no  If all of the above answers are NO, then may proceed with Cephalosporin use.  Product containing penicillin (product)   Pregabalin  Swelling   Amitriptyline  Other (See Comments)    felt weird, fatigue, dizziness  amitriptyline    Bupropion  Other (See Comments)    Uncontrolled movements of tongue, hand   Escitalopram Oxalate Other (See Comments)    Nausea and hypersalivation.  escitalopram oxalate    Ozempic  (0.25 Or 0.5 Mg-Dose) [Semaglutide (0.25 Or 0.5mg -Dos)] Other (See Comments)    Abdominal pain   Prozac  [Fluoxetine  Hcl] Hives   Tirzepatide  Other (See Comments)   Rosuvastatin      Review of Systems  Constitutional:  Positive for malaise/fatigue. Negative for fever.  HENT:  Positive for congestion and sore throat.   Eyes:  Negative for blurred vision.  Respiratory:  Positive for cough. Negative for shortness of breath.   Cardiovascular:  Negative for chest pain, palpitations and leg swelling.  Gastrointestinal:  Negative for abdominal pain, blood in stool and nausea.  Genitourinary:  Negative for dysuria and frequency.  Musculoskeletal:  Negative for falls.  Skin:  Negative for rash.  Neurological:  Negative for dizziness, loss of consciousness and headaches.  Endo/Heme/Allergies:  Negative for environmental allergies.  Psychiatric/Behavioral:  Negative for depression. The patient is not nervous/anxious.        Objective:    Physical Exam Constitutional:      General: She is not in acute distress.    Appearance: Normal appearance. She is well-developed. She is not toxic-appearing.  HENT:     Head: Normocephalic and atraumatic.     Right Ear: External ear normal.     Left Ear: External ear normal.     Nose: Nose normal.  Eyes:     General:        Right eye: No discharge.        Left eye: No discharge.     Conjunctiva/sclera: Conjunctivae normal.  Neck:     Thyroid : No thyromegaly.  Cardiovascular:     Rate and Rhythm: Normal rate and regular rhythm.     Heart sounds: Normal heart sounds. No murmur heard. Pulmonary:     Effort: Pulmonary effort is normal. No respiratory distress.     Breath sounds: Normal breath sounds.  Abdominal:     General: Bowel sounds are normal.  Palpations: Abdomen is soft.     Tenderness: There is no abdominal tenderness. There is no guarding.  Musculoskeletal:        General: Normal range of motion.     Cervical back: Neck  supple.  Lymphadenopathy:     Cervical: No cervical adenopathy.  Skin:    General: Skin is warm and dry.  Neurological:     Mental Status: She is alert and oriented to person, place, and time.  Psychiatric:        Mood and Affect: Mood normal.        Behavior: Behavior normal.        Thought Content: Thought content normal.        Judgment: Judgment normal.     BP 122/82   Pulse 67   Resp 16   Ht 5' 6 (1.676 m)   Wt 280 lb 3.2 oz (127.1 kg)   SpO2 99%   BMI 45.23 kg/m  Wt Readings from Last 3 Encounters:  12/13/23 280 lb 3.2 oz (127.1 kg)  12/10/23 282 lb 12.8 oz (128.3 kg)  10/09/23 275 lb (124.7 kg)    Diabetic Foot Exam - Simple   No data filed    Lab Results  Component Value Date   WBC 9.1 12/13/2023   HGB 12.2 12/13/2023   HCT 37.3 12/13/2023   PLT 279.0 12/13/2023   GLUCOSE 103 (H) 12/13/2023   CHOL 141 12/13/2023   TRIG 143.0 12/13/2023   HDL 46.50 12/13/2023   LDLDIRECT 110.0 08/21/2016   LDLCALC 66 12/13/2023   ALT 19 12/13/2023   AST 23 12/13/2023   NA 139 12/13/2023   K 3.8 12/13/2023   CL 97 12/13/2023   CREATININE 1.01 12/13/2023   BUN 15 12/13/2023   CO2 30 12/13/2023   TSH 0.62 12/13/2023   INR 2.0 (H) 06/21/2008   HGBA1C 6.7 (H) 12/13/2023   MICROALBUR <0.7 12/13/2023    Lab Results  Component Value Date   TSH 0.62 12/13/2023   Lab Results  Component Value Date   WBC 9.1 12/13/2023   HGB 12.2 12/13/2023   HCT 37.3 12/13/2023   MCV 83.7 12/13/2023   PLT 279.0 12/13/2023   Lab Results  Component Value Date   NA 139 12/13/2023   K 3.8 12/13/2023   CO2 30 12/13/2023   GLUCOSE 103 (H) 12/13/2023   BUN 15 12/13/2023   CREATININE 1.01 12/13/2023   BILITOT 0.5 12/13/2023   ALKPHOS 71 12/13/2023   AST 23 12/13/2023   ALT 19 12/13/2023   PROT 7.8 12/13/2023   ALBUMIN  4.4 12/13/2023   CALCIUM  9.7 12/13/2023   ANIONGAP 8 03/19/2023   EGFR 73 12/10/2023   GFR 56.85 (L) 12/13/2023   Lab Results  Component Value Date   CHOL  141 12/13/2023   Lab Results  Component Value Date   HDL 46.50 12/13/2023   Lab Results  Component Value Date   LDLCALC 66 12/13/2023   Lab Results  Component Value Date   TRIG 143.0 12/13/2023   Lab Results  Component Value Date   CHOLHDL 3 12/13/2023   Lab Results  Component Value Date   HGBA1C 6.7 (H) 12/13/2023       Assessment & Plan:  Essential hypertension Assessment & Plan: Well controlled, no changes to meds. Encouraged heart healthy diet such as the DASH diet and exercise as tolerated.    Orders: -     T4, free -     TSH -  CBC with Differential/Platelet -     Comprehensive metabolic panel with GFR -     Furosemide ; Take 1 tablet (40 mg total) by mouth 2 (two) times daily.  Dispense: 180 tablet; Refill: 3  Gout of big toe Assessment & Plan: Hydrate and monitor   Orders: -     Uric acid  Secondary hypertension Assessment & Plan: Well controlled, no changes to meds. Encouraged heart healthy diet such as the DASH diet and exercise as tolerated.    Orders: -     T4, free  Type 2 diabetes mellitus with obesity (HCC) Assessment & Plan: hgba1c acceptable, minimize simple carbs. Increase exercise as tolerated. Continue current meds  Orders: -     Microalbumin / creatinine urine ratio -     T4, free -     Hemoglobin A1c  Vitamin D  deficiency Assessment & Plan: Supplement and monitor   Orders: -     VITAMIN D  25 Hydroxy (Vit-D Deficiency, Fractures)  Mixed hyperlipidemia -     Lipid panel -     AMB Referral to [facility]  Statin intolerance -     AMB Referral to [facility]  Other orders -     Doxycycline  Hyclate; Take 1 tablet (100 mg total) by mouth 2 (two) times daily.  Dispense: 20 tablet; Refill: 0 -     Nitroglycerin ; Place 1 tablet (0.4 mg total) under the tongue every 5 (five) minutes as needed for chest pain.  Dispense: 25 tablet; Refill: 1 -     busPIRone  HCl; Take 1 tablet (5 mg total)  by mouth 3 (three) times daily.  Dispense: 270 tablet; Refill: 1    Assessment and Plan Assessment & Plan Hoarseness and Dysphagia Hoarseness likely due to post-surgical scar tissue; dysphagia possibly from silent heartburn or esophageal issues. - Continue famotidine  40 mg orally daily. - Consider gastroenterology referral if symptoms persist or worsen. - Consider thyroid  ultrasound if hoarseness worsens.  Chronic Cough with Sputum Production Chronic cough with sputum likely due to viral infection or silent heartburn; trial of antibiotics due to prolonged symptoms. - Prescribe doxycycline .  Type 2 Diabetes Mellitus Currently managed with empagliflozin ; discussed GLP-1 receptor agonists for weight loss and glucose control, noting previous stomach upset. - Continue empagliflozin  (Jardiance ) 10 mg orally daily. - Consider retrying GLP-1 receptor agonists with dietary modifications if willing.  Hyperlipidemia with Statin Intolerance Significant fatigue and weakness on rosuvastatin ; discussed PCSK9 inhibitors due to diabetes and heart disease risk. - Refer to lipid clinic for evaluation and potential initiation of PCSK9 inhibitors.     Harlene Horton, MD

## 2023-12-17 ENCOUNTER — Other Ambulatory Visit: Payer: Self-pay | Admitting: Family Medicine

## 2023-12-17 ENCOUNTER — Telehealth (HOSPITAL_COMMUNITY): Payer: Self-pay | Admitting: *Deleted

## 2023-12-17 MED ORDER — BUSPIRONE HCL 7.5 MG PO TABS: 7.5000 mg | ORAL_TABLET | Freq: Two times a day (BID) | ORAL | 2 refills | Status: DC

## 2023-12-17 NOTE — Telephone Encounter (Signed)
 Reaching out to patient to offer assistance regarding upcoming cardiac imaging study; pt verbalizes understanding of appt date/time, parking situation and where to check in, pre-test NPO status and medications ordered, and verified current allergies; name and call back number provided for further questions should they arise  Chantal Requena RN Navigator Cardiac Imaging [facility] [phone] office [phone] cell  Patient to take 100mg  metoprolol  tartrate two prior to her cardiac CT scan.

## 2023-12-18 ENCOUNTER — Ambulatory Visit (HOSPITAL_COMMUNITY)
Admission: RE | Admit: 2023-12-18 | Discharge: 2023-12-18 | Disposition: A | Discharge status: Home / Self Care | Source: Ambulatory Visit | Attending: Cardiovascular Disease | Admitting: Cardiovascular Disease

## 2023-12-18 DIAGNOSIS — K2289 Other specified disease of esophagus: Secondary | ICD-10-CM | POA: Insufficient documentation

## 2023-12-18 DIAGNOSIS — R072 Precordial pain: Secondary | ICD-10-CM | POA: Diagnosis not present

## 2023-12-18 DIAGNOSIS — K449 Diaphragmatic hernia without obstruction or gangrene: Secondary | ICD-10-CM | POA: Diagnosis not present

## 2023-12-18 DIAGNOSIS — R079 Chest pain, unspecified: Secondary | ICD-10-CM | POA: Diagnosis not present

## 2023-12-18 MED ORDER — NITROGLYCERIN 0.4 MG SL SUBL
0.8000 mg | SUBLINGUAL_TABLET | Freq: Once | SUBLINGUAL | Status: AC
  Administered 2023-12-18: 0.8 mg via SUBLINGUAL

## 2023-12-18 MED ORDER — IOHEXOL 350 MG/ML SOLN
100.0000 mL | Freq: Once | INTRAVENOUS | Status: AC | PRN
  Administered 2023-12-18: 100 mL via INTRAVENOUS

## 2024-01-16 ENCOUNTER — Ambulatory Visit (HOSPITAL_COMMUNITY)
Admission: RE | Admit: 2024-01-16 | Discharge: 2024-01-16 | Disposition: A | Discharge status: Home / Self Care | Source: Ambulatory Visit | Attending: Cardiovascular Disease | Admitting: Cardiovascular Disease

## 2024-01-16 DIAGNOSIS — R609 Edema, unspecified: Secondary | ICD-10-CM | POA: Insufficient documentation

## 2024-01-16 DIAGNOSIS — R079 Chest pain, unspecified: Secondary | ICD-10-CM | POA: Diagnosis not present

## 2024-01-16 DIAGNOSIS — R072 Precordial pain: Secondary | ICD-10-CM | POA: Diagnosis not present

## 2024-01-16 DIAGNOSIS — I10 Essential (primary) hypertension: Secondary | ICD-10-CM

## 2024-01-16 DIAGNOSIS — G4733 Obstructive sleep apnea (adult) (pediatric): Secondary | ICD-10-CM | POA: Diagnosis not present

## 2024-01-16 DIAGNOSIS — E782 Mixed hyperlipidemia: Secondary | ICD-10-CM | POA: Diagnosis not present

## 2024-01-16 NOTE — Assessment & Plan Note (Signed)
 Encouraged moist heat and gentle stretching as tolerated. May try NSAIDs and prescription meds as directed and report if symptoms worsen or seek immediate care

## 2024-01-16 NOTE — Assessment & Plan Note (Signed)
 [id number] acceptable, minimize simple carbs. Increase exercise as tolerated. Continue current meds

## 2024-01-16 NOTE — Assessment & Plan Note (Signed)
 Well controlled, no changes to meds. Encouraged heart healthy diet such as the DASH diet and exercise as tolerated.

## 2024-01-16 NOTE — Assessment & Plan Note (Signed)
 Encouraged DASH or MIND diet, decrease po intake and increase exercise as tolerated. Needs 7-8 hours of sleep nightly. Avoid trans fats, eat small, frequent meals every 4-5 hours with lean proteins, complex carbs and healthy fats. Minimize simple carbs, high fat foods and processed foods

## 2024-01-17 ENCOUNTER — Ambulatory Visit (INDEPENDENT_AMBULATORY_CARE_PROVIDER_SITE_OTHER): Admitting: Family Medicine

## 2024-01-17 VITALS — BP 136/70 | HR 78 | Temp 98.8°F | Resp 16 | Ht 66.0 in | Wt 279.0 lb

## 2024-01-17 DIAGNOSIS — Z6841 Body Mass Index (BMI) 40.0 and over, adult: Secondary | ICD-10-CM

## 2024-01-17 DIAGNOSIS — M5441 Lumbago with sciatica, right side: Secondary | ICD-10-CM | POA: Diagnosis not present

## 2024-01-17 DIAGNOSIS — E1169 Type 2 diabetes mellitus with other specified complication: Secondary | ICD-10-CM

## 2024-01-17 DIAGNOSIS — E669 Obesity, unspecified: Secondary | ICD-10-CM | POA: Diagnosis not present

## 2024-01-17 DIAGNOSIS — I159 Secondary hypertension, unspecified: Secondary | ICD-10-CM | POA: Diagnosis not present

## 2024-01-17 LAB — ECHOCARDIOGRAM COMPLETE
Area-P 1/2: 3.21 cm2
S' Lateral: 2.2 cm

## 2024-01-17 MED ORDER — DOXYCYCLINE HYCLATE 100 MG PO TABS: 100.0000 mg | ORAL_TABLET | Freq: Two times a day (BID) | ORAL | 0 refills | Status: DC

## 2024-01-17 NOTE — Progress Notes (Signed)
 Subjective:    Patient ID: [name], female    DOB: [birth year], 68 y.o.   MRN: [id number]  Chief Complaint  Patient presents with   Wound Check    Incision from back surgery 2 years ago, tender    Anxiety    Patient reports anxiety symptoms increasing, stress     HPI Discussed the use of AI scribe software for clinical note transcription with the patient, who gave verbal consent to proceed.  History of Present Illness [name] is a 68 year old female who presents with concerns about a recurrent cyst at the incision site of her previous back surgery.  She is concerned about a recurrent cyst at the site of her previous back surgery incision. The cyst is currently not present, but she worries it may refill or become infected, especially given her diabetes and age. No fevers, chills, systemic symptoms, or drainage.  She has been experiencing a chronic cough with clear, thick sputum for several months. The sputum is not green or yellow. Previously, doxycycline  provided relief, but symptoms returned after discontinuation. She is currently managing the symptoms with hydration and Mucinex .  She experiences anxiety and sleep disturbances, including difficulty falling and staying asleep. She is taking Buspar  7.5 mg twice daily for anxiety. She has a history of adverse reactions to fluoxetine  and Lexapro, including hives and nausea, respectively. She uses a CPAP machine regularly but reports difficulty sleeping despite its use. Her stress is exacerbated by her mother's recent hospitalization and her daughter's cancer treatment.  No new chest pain or breathing trouble. She reports soreness at the site of a recent echocardiogram.    Past Medical History:  Diagnosis Date   Allergic rhinitis 07/05/2007   Qualifier: Diagnosis of   By: Lorane Browning         Allergic urticaria 03/27/2016   Allergy    Anemia 07/17/2016   Anxiety    Arthralgia 12/03/2016   Asthma     seasonal   Asthma with acute exacerbation 07/14/2014   ATTENTION DEFICIT DISORDER, INATTENTIVE TYPE 09/22/2009   Qualifier: Diagnosis of   By: Mahlon MD, Comer      Replacing diagnoses that were inactivated after the 08/28/22 regulatory import     Baker's cyst of knee, right 02/26/2023   Blood transfusion without reported diagnosis    Breast cancer (HCC)    right   Colon polyps    CPAP (continuous positive airway pressure) dependence    DDD (degenerative disc disease), cervical 05/04/2021   Depression    Depression with anxiety 06/25/2006   Qualifier: Diagnosis of   By: Nicholaus Channel         Dermographia 03/27/2016   Diverticulitis of colon without hemorrhage 09/21/2014   Ear canal dryness 08/03/2011   Edema    Educated about COVID-19 virus infection 04/19/2020   Elevated sed rate 04/03/2022   Essential hypertension 06/25/2006   Qualifier: Diagnosis of   By: Nicholaus Channel         Family history of breast cancer in first degree relative    Fatigue 08/03/2011   Fibromyalgia    Gastroesophageal reflux disease without esophagitis 06/25/2006   Centricity Description: GERD  Qualifier: Diagnosis of   By: Nicholaus Channel      Centricity Description: ESOPHAGEAL REFLUX  Qualifier: Diagnosis of   By: Tish MD, Elsie Overall testing 05/15/2016   Negative for mutations within any of 32 genes on the Custom  Cancer Panel through [employer].  No variants of uncertain significance (VUSes were found).  This Custom Panel offered by [facility] Evert, MD) includes sequencing and/or deletion duplication testing of the following 34 genes: APC, ATM, AXIN2, BARD1, BMPR1A, BRCA1, BRCA2, BRIP1, CDH1, CDK4, CDKN2A, CHEK2, EPCAM, F   GERD (gastroesophageal reflux disease)    Gout of big toe 03/10/2013   Allergic to Allopurinol       History of breast cancer 03/27/2016   History of colonic polyps 08/14/2011   03/2006 - diminutive adenoma removed Ollen)  08/14/2011 -  diminutive cecal adenoma removed  IMO SNOMED Dx Update Oct 2024     Hyperglycemia 03/05/2017   Hyperlipidemia 08/21/2016   no meds   Hypertension    controlled by medications   Insomnia 02/15/2009   Qualifier: Diagnosis of   By: Mahlon MD, Comer         Joint pain    Left arm pain 01/21/2021   Low back pain 01/11/2022   Memory changes 04/19/2020   Morbid obesity (HCC) 09/21/2010   Neck pain 05/02/2021   Neuromuscular disorder (HCC)    Fibromyalgia   Obesity    OSA (obstructive sleep apnea) 04/23/2012   12/13 Mod- AHI 15/h, corrected by CPAP 8 cm     Osteoarthritis    RA   Other peripheral vertigo, unspecified ear 10/27/2019   PAC (premature atrial contraction) 09/21/2010   Palpitations 09/21/2010   Peripheral neuropathy 04/19/2020   Gabapentin  caused edema     PONV (postoperative nausea and vomiting)    occasional   Pre-diabetes    Preventative health care 10/08/2012   Rheumatoid arthritis (HCC) 06/24/2015   History of diagnosis of RA is questionable. Not highly symptomatic at this time     Right hand pain 11/13/2022   Right knee pain 12/06/2016   RLS (restless legs syndrome) 06/07/2017   Spondylolisthesis of lumbar region 09/21/2021   TMJ arthralgia 04/19/2020   Type 2 diabetes mellitus with obesity (HCC) 12/10/2011   dx'd 11/2011     Urinary incontinence 10/27/2019   Vertigo 03/14/2018   Viral meningitis    Vitamin D  deficiency 03/11/2008   Qualifier: Diagnosis of   By: Mahlon MD, Comer          Past Surgical History:  Procedure Laterality Date   ABDOMINAL HYSTERECTOMY  1998   ADENOIDECTOMY     BACK SURGERY     09/21/21   BREAST EXCISIONAL BIOPSY Left 1993   benign   BREAST SURGERY     Tran flap due to breast cancer   CESAREAN SECTION  1988   COLONOSCOPY  08/14/2011   HAND SURGERY     dog bite, right hand   HAND SURGERY     trauma, left hand   KNEE ARTHROSCOPY     bilateral   left knee replacement  2010   LUMBAR WOUND DEBRIDEMENT N/A 10/07/2021    Procedure: LUMBAR WOUND DEBRIDEMENT/REVISION;  Surgeon: Gillie Duncans, MD;  Location: [facility] OR;  Service: Neurosurgery;  Laterality: N/A;  3C/RM 21 to follow Dr Lanis   MASTECTOMY  01/13/1994   Right breast   parathyroid  resection     PARATHYROIDECTOMY     POLYPECTOMY     rectal abscess     SEPTOPLASTY  1980   SHOULDER ARTHROSCOPY Right 11/09/2015   Procedure: ARTHROSCOPY SHOULDER-acromioplasty, distal clavicle resection and debridement;  Surgeon: Maude Herald, MD;  Location: [facility] OR;  Service: Orthopedics;  Laterality: Right;   shoulder arthroscopy     rotator  cuff repair   TOE SURGERY Right    paronychia and adenoma removed   TONSILLECTOMY     TOTAL KNEE ARTHROPLASTY  06/18/2008   Daldorf   TUMOR REMOVAL  1982   , scalp    Family History  Problem Relation Age of Onset   Emphysema Father    Lung cancer Father        lung ca dx 64   Other Father        prostate issues   Diabetes Father    Breast cancer Maternal Aunt        dx late 31s   Colon cancer Maternal Aunt        dx 14s   Colon polyps Maternal Aunt    Prostate cancer Maternal Grandfather 25       d. 85y   Heart disease Paternal Grandfather    Heart attack Paternal Grandfather        d. 50y   Diabetes Paternal Grandfather    Asthma Daughter        seasonal   Arthritis Maternal Grandmother    Aneurysm Maternal Grandmother        d. brain aneurysm at 32   Arthritis Mother    Colon polyps Mother    Hypertension Mother    Sleep apnea Mother    Multiple sclerosis Sister    Breast cancer Sister 51       L IDC and DCIS; ER/PR+, Her2-   Fibrocystic breast disease Sister    Arthritis/Rheumatoid Sister    Breast cancer Sister    Cancer Maternal Uncle        d. mouth cancer at younger age; smoker   Other Paternal Uncle        muscle issues - couldn't walk or talk; d. 20y   Cancer Maternal Aunt        lymphoma, dx 80s   Ovarian cancer Maternal Aunt        dx 23s; d. late 67s   Lung cancer Maternal Uncle         d. 79y; former smoker   Throat cancer Cousin        maternal 1st cousin; smoker   Breast cancer Cousin 15       maternal 1st cousin   Other Paternal Uncle        prostate issues   Breast cancer Cousin        paternal 1st cousin dx late 71s   Throat cancer Cousin        maternal 1st cousin; used SL tobacco   Prostate cancer Cousin        maternal 1st cousin dx 90s   Esophageal cancer Neg Hx    Rectal cancer Neg Hx    Stomach cancer Neg Hx     Social History   Socioeconomic History   Marital status: Widowed    Spouse name: Not on file   Number of children: 1   Years of education: Not on file   Highest education level: 12th grade  Occupational History   Occupation: ACCOUNTING    Employer: [employer]  Tobacco Use   Smoking status: Never   Smokeless tobacco: Never  Vaping Use   Vaping status: Never Used  Substance and Sexual Activity   Alcohol use: Not Currently   Drug use: No   Sexual activity: Not Currently  Other Topics Concern   Not on file  Social History Narrative   Not on file  Social Drivers of Corporate investment banker Strain: Low Risk  (12/13/2023)   Overall Financial Resource Strain (CARDIA)    Difficulty of Paying Living Expenses: Not hard at all  Food Insecurity: No Food Insecurity (12/13/2023)   Hunger Vital Sign    Worried About Running Out of Food in the Last Year: Never true    Ran Out of Food in the Last Year: Never true  Transportation Needs: No Transportation Needs (12/13/2023)   PRAPARE - Administrator, Civil Service (Medical): No    Lack of Transportation (Non-Medical): No  Physical Activity: Inactive (12/13/2023)   Exercise Vital Sign    Days of Exercise per Week: 0 days    Minutes of Exercise per Session: Not on file  Stress: No Stress Concern Present (12/13/2023)   [employer] of Occupational Health - Occupational Stress Questionnaire    Feeling of Stress: Only a little  Social Connections: Moderately  Integrated (12/13/2023)   Social Connection and Isolation Panel    Frequency of Communication with Friends and Family: More than three times a week    Frequency of Social Gatherings with Friends and Family: Twice a week    Attends Religious Services: More than 4 times per year    Active Member of [employer] or Organizations: Yes    Attends Banker Meetings: More than 4 times per year    Marital Status: Widowed  Intimate Partner Violence: Not At Risk (12/20/2022)   Humiliation, Afraid, Rape, and Kick questionnaire    Fear of Current or Ex-Partner: No    Emotionally Abused: No    Physically Abused: No    Sexually Abused: No    Outpatient Medications Prior to Visit  Medication Sig Dispense Refill   acetaminophen  (TYLENOL ) 650 MG CR tablet Take 1,300 mg by mouth every 8 (eight) hours as needed for pain.     busPIRone  (BUSPAR ) 7.5 MG tablet Take 1 tablet (7.5 mg total) by mouth 2 (two) times daily. 60 tablet 2   Calcium  Carbonate-Vit D-Min (CALCIUM  600 + MINERALS) 600-200 MG-UNIT TABS Take 600 mg by mouth daily.     cholecalciferol  (VITAMIN D3) 25 MCG (1000 UNIT) tablet Take 1,000 Units by mouth daily. (Patient taking differently: Take 2,000 Units by mouth daily.)     Cyanocobalamin  (B-12) 1000 MCG CAPS Take 1 capsule by mouth daily at 6 (six) AM.     empagliflozin  (JARDIANCE ) 10 MG TABS tablet Take 10 mg by mouth daily.     famotidine  (PEPCID ) 20 MG tablet Take 2 tablets (40 mg total) by mouth daily.     furosemide  (LASIX ) 40 MG tablet Take 1 tablet (40 mg total) by mouth 2 (two) times daily. 180 tablet 3   hydrochlorothiazide  (MICROZIDE ) 12.5 MG capsule TAKE 1 CAPSULE BY MOUTH DAILY 100 capsule 2   Iron -Vitamin C (VITRON-C) 65-125 MG TABS Take 1 tablet by mouth daily.     metoprolol  tartrate (LOPRESSOR ) 100 MG tablet Take 1 tablet (100mg ) TWO hours prior to CT scan 1 tablet 0   Multiple Vitamins-Minerals (CENTRUM SILVER ULTRA WOMENS) TABS Take 1 tablet by mouth daily.      nitroGLYCERIN  (NITROSTAT ) 0.4 MG SL tablet Place 1 tablet (0.4 mg total) under the tongue every 5 (five) minutes as needed for chest pain. 25 tablet 1   Omega 3-6-9 Fatty Acids (OMEGA 3-6-9 PO) Take 300 mg by mouth daily. Omega XL     peppermint oil liquid Apply topically as needed. Peppermint, wintergreen, black seed, basil  and frankincense     telmisartan  (MICARDIS ) 20 MG tablet TAKE 1 TABLET BY MOUTH DAILY 100 tablet 2   doxycycline  (VIBRA -TABS) 100 MG tablet Take 1 tablet (100 mg total) by mouth 2 (two) times daily. 20 tablet 0   No facility-administered medications prior to visit.    Allergies  Allergen Reactions   Allopurinol  Hives   Fluoxetine  Hives   Mucinex  [Guaifenesin  Er] Shortness Of Breath   Penicillins Hives, Other (See Comments) and Itching    Has patient had a PCN reaction causing immediate rash, facial/tongue/throat swelling, SOB or lightheadedness with hypotension: no  Has patient had a PCN reaction causing severe rash involving mucus membranes or skin necrosis: no  Has patient had a PCN reaction that required hospitalization no  Has patient had a PCN reaction occurring within the last 10 years: no  If all of the above answers are NO, then may proceed with Cephalosporin use.  Product containing penicillin (product)   Pregabalin  Swelling   Amitriptyline  Other (See Comments)    felt weird, fatigue, dizziness  amitriptyline    Bupropion  Other (See Comments)    Uncontrolled movements of tongue, hand   Escitalopram Oxalate Other (See Comments)    Nausea and hypersalivation.  escitalopram oxalate   Ozempic  (0.25 Or 0.5 Mg-Dose) [Semaglutide (0.25 Or 0.5mg -Dos)] Other (See Comments)    Abdominal pain   Prozac  [Fluoxetine  Hcl] Hives   Tirzepatide  Other (See Comments)   Rosuvastatin      Review of Systems  Constitutional:  Negative for fever and malaise/fatigue.  HENT:  Negative for congestion.   Eyes:  Negative for blurred vision.  Respiratory:  Positive for  cough. Negative for shortness of breath.   Cardiovascular:  Positive for chest pain. Negative for palpitations and leg swelling.  Gastrointestinal:  Negative for abdominal pain, blood in stool and nausea.  Genitourinary:  Negative for dysuria and frequency.  Musculoskeletal:  Positive for back pain. Negative for falls.  Skin:  Negative for rash.  Neurological:  Negative for dizziness, loss of consciousness and headaches.  Endo/Heme/Allergies:  Negative for environmental allergies.  Psychiatric/Behavioral:  Positive for depression. The patient is nervous/anxious and has insomnia.        Objective:    Physical Exam Constitutional:      General: She is not in acute distress.    Appearance: Normal appearance. She is well-developed. She is not toxic-appearing.  HENT:     Head: Normocephalic and atraumatic.     Right Ear: External ear normal.     Left Ear: External ear normal.     Nose: Nose normal.  Eyes:     General:        Right eye: No discharge.        Left eye: No discharge.     Conjunctiva/sclera: Conjunctivae normal.  Neck:     Thyroid : No thyromegaly.  Cardiovascular:     Rate and Rhythm: Normal rate and regular rhythm.     Heart sounds: Normal heart sounds. No murmur heard. Pulmonary:     Effort: Pulmonary effort is normal. No respiratory distress.     Breath sounds: Normal breath sounds.  Abdominal:     General: Bowel sounds are normal.     Palpations: Abdomen is soft.     Tenderness: There is no abdominal tenderness. There is no guarding.  Musculoskeletal:        General: Normal range of motion.     Cervical back: Neck supple.  Lymphadenopathy:     Cervical: No cervical adenopathy.  Skin:    General: Skin is warm and dry.     Findings: Lesion present.     Comments: Low back spinal incision healed but a cystic structure mildly erythematous at caudal aspect, no fluctuance or discharge  Neurological:     Mental Status: She is alert and oriented to person, place,  and time.  Psychiatric:        Mood and Affect: Mood normal.        Behavior: Behavior normal.        Thought Content: Thought content normal.        Judgment: Judgment normal.     BP 136/70 (BP Location: Left Arm, Patient Position: Sitting, Cuff Size: Normal)   Pulse 78   Temp 98.8 F (37.1 C) (Oral)   Resp 16   Ht 5' 6 (1.676 m)   Wt 279 lb (126.6 kg)   SpO2 97%   BMI 45.03 kg/m  Wt Readings from Last 3 Encounters:  01/17/24 279 lb (126.6 kg)  12/13/23 280 lb 3.2 oz (127.1 kg)  12/10/23 282 lb 12.8 oz (128.3 kg)    Diabetic Foot Exam - Simple   No data filed    Lab Results  Component Value Date   WBC 9.1 12/13/2023   HGB 12.2 12/13/2023   HCT 37.3 12/13/2023   PLT 279.0 12/13/2023   GLUCOSE 103 (H) 12/13/2023   CHOL 141 12/13/2023   TRIG 143.0 12/13/2023   HDL 46.50 12/13/2023   LDLDIRECT 110.0 08/21/2016   LDLCALC 66 12/13/2023   ALT 19 12/13/2023   AST 23 12/13/2023   NA 139 12/13/2023   K 3.8 12/13/2023   CL 97 12/13/2023   CREATININE 1.01 12/13/2023   BUN 15 12/13/2023   CO2 30 12/13/2023   TSH 0.62 12/13/2023   INR 2.0 (H) 06/21/2008   HGBA1C 6.7 (H) 12/13/2023   MICROALBUR <0.7 12/13/2023    Lab Results  Component Value Date   TSH 0.62 12/13/2023   Lab Results  Component Value Date   WBC 9.1 12/13/2023   HGB 12.2 12/13/2023   HCT 37.3 12/13/2023   MCV 83.7 12/13/2023   PLT 279.0 12/13/2023   Lab Results  Component Value Date   NA 139 12/13/2023   K 3.8 12/13/2023   CO2 30 12/13/2023   GLUCOSE 103 (H) 12/13/2023   BUN 15 12/13/2023   CREATININE 1.01 12/13/2023   BILITOT 0.5 12/13/2023   ALKPHOS 71 12/13/2023   AST 23 12/13/2023   ALT 19 12/13/2023   PROT 7.8 12/13/2023   ALBUMIN  4.4 12/13/2023   CALCIUM  9.7 12/13/2023   ANIONGAP 8 03/19/2023   EGFR 73 12/10/2023   GFR 56.85 (L) 12/13/2023   Lab Results  Component Value Date   CHOL 141 12/13/2023   Lab Results  Component Value Date   HDL 46.50 12/13/2023   Lab  Results  Component Value Date   LDLCALC 66 12/13/2023   Lab Results  Component Value Date   TRIG 143.0 12/13/2023   Lab Results  Component Value Date   CHOLHDL 3 12/13/2023   Lab Results  Component Value Date   HGBA1C 6.7 (H) 12/13/2023       Assessment & Plan:  Secondary hypertension Assessment & Plan: Well controlled, no changes to meds. Encouraged heart healthy diet such as the DASH diet and exercise as tolerated.     Obesity, unspecified class, unspecified obesity type, unspecified whether serious comorbidity present Assessment & Plan: Encouraged DASH or MIND diet, decrease po  intake and increase exercise as tolerated. Needs 7-8 hours of sleep nightly. Avoid trans fats, eat small, frequent meals every 4-5 hours with lean proteins, complex carbs and healthy fats. Minimize simple carbs, high fat foods and processed foods    Type 2 diabetes mellitus with obesity (HCC) Assessment & Plan: hgba1c acceptable, minimize simple carbs. Increase exercise as tolerated. Continue current meds   Right-sided low back pain with right-sided sciatica, unspecified chronicity Assessment & Plan: Encouraged moist heat and gentle stretching as tolerated. May try NSAIDs and prescription meds as directed and report if symptoms worsen or seek immediate care    Other orders -     Doxycycline  Hyclate; Take 1 tablet (100 mg total) by mouth 2 (two) times daily.  Dispense: 20 tablet; Refill: 0    Assessment and Plan Assessment & Plan Post-surgical cyst at back incision site Tenderness at the incision site with possible cyst formation. No systemic symptoms such as fever or chills. Differential includes a superficial cyst or deeper smoldering infection. Risk of recurrent infection due to diabetes and age. Current surgeon not preferred by her; alternative options available. - Examine the incision site for signs of infection or cyst formation. - Prescribe doxycycline  if the site becomes red, hot,  swollen, or starts draining. - Consider referral to a neurosurgeon or general surgeon if the cyst recurs or becomes problematic.  Chronic cough with sputum production and persistent throat pain Chronic cough with clear, thick sputum production. No signs of infection as sputum is not green or yellow. Possible contribution from allergies or age-related changes. Doxycycline  prescribed as a precaution due to penicillin allergy. - Prescribe doxycycline  if sputum changes color or symptoms worsen. - Encourage use of plain Mucinex  and adequate hydration. - Recommend nasal saline for hydration and flushing of nasal passages. - Advise use of Flonase  if allergy symptoms are present.  Insomnia and generalized anxiety disorder Difficulty falling and staying asleep, likely exacerbated by stress and anxiety. Current treatment with Buspirone  at 7.5 mg twice daily. Previous adverse reactions to SSRIs. Cognitive behavioral therapy for insomnia (CBTI) recommended as a non-pharmacological approach. Experiencing stress related to family issues. - Increase Buspirone  to 12.5 mg twice daily by combining 7.5 mg and 5 mg tablets. - Consider increasing to 15 mg twice daily if partial response and no side effects. - Recommend CBTI app by [facility] for non-pharmacological management of insomnia.  Obstructive sleep apnea, on CPAP Continued use of CPAP with difficulty sleeping.  Chronic musculoskeletal chest wall pain Intermittent sharp chest pain, likely musculoskeletal or related to breast tissue. Pain is reproducible with palpation, suggesting a non-cardiac origin. - Consider further evaluation if pain persists or worsens.  Recording duration: 23 minutes     Harlene Horton, MD

## 2024-01-17 NOTE — Patient Instructions (Addendum)
 For now take Buspar  5 mg + 7.5 mg tabs together in morning and evening for a total of 12.5 mg twice daily if partial response and no side effects we can increase to 15 mg twice daily  CBTinsomnia APP by [facility]  -Insomnia Insomnia is a sleep disorder that makes it difficult to fall asleep or stay asleep. Insomnia can cause fatigue, low energy, difficulty concentrating, mood swings, and poor performance at work or school. There are three different ways to classify insomnia: Difficulty falling asleep. Difficulty staying asleep. Waking up too early in the morning. Any type of insomnia can be long-term (chronic) or short-term (acute). Both are common. Short-term insomnia usually lasts for 3 months or less. Chronic insomnia occurs at least three times a week for longer than 3 months. What are the causes? Insomnia may be caused by another condition, situation, or substance, such as: Having certain mental health conditions, such as anxiety and depression. Using caffeine, alcohol, tobacco, or drugs. Having gastrointestinal conditions, such as gastroesophageal reflux disease (GERD). Having certain medical conditions. These include: Asthma. Alzheimer's disease. Stroke. Chronic pain. An overactive thyroid  gland (hyperthyroidism). Other sleep disorders, such as restless legs syndrome and sleep apnea. Menopause. Sometimes, the cause of insomnia may not be known. What increases the risk? Risk factors for insomnia include: Gender. Females are affected more often than males. Age. Insomnia is more common as people get older. Stress and certain medical and mental health conditions. Lack of exercise. Having an irregular work schedule. This may include working night shifts and traveling between different time zones. What are the signs or symptoms? If you have insomnia, the main symptom is having trouble falling asleep or having trouble staying asleep. This may lead to other symptoms, such as: Feeling tired  or having low energy. Feeling nervous about going to sleep. Not feeling rested in the morning. Having trouble concentrating. Feeling irritable, anxious, or depressed. How is this diagnosed? This condition may be diagnosed based on: Your symptoms and medical history. Your health care provider may ask about: Your sleep habits. Any medical conditions you have. Your mental health. A physical exam. How is this treated? Treatment for insomnia depends on the cause. Treatment may focus on treating an underlying condition that is causing the insomnia. Treatment may also include: Medicines to help you sleep. Counseling or therapy. Lifestyle adjustments to help you sleep better. Follow these instructions at home: Eating and drinking  Limit or avoid alcohol, caffeinated beverages, and products that contain nicotine and tobacco, especially close to bedtime. These can disrupt your sleep. Do not eat a large meal or eat spicy foods right before bedtime. This can lead to digestive discomfort that can make it hard for you to sleep. Sleep habits  Keep a sleep diary to help you and your health care provider figure out what could be causing your insomnia. Write down: When you sleep. When you wake up during the night. How well you sleep and how rested you feel the next day. Any side effects of medicines you are taking. What you eat and drink. Make your bedroom a dark, comfortable place where it is easy to fall asleep. Put up shades or blackout curtains to block light from outside. Use a white noise machine to block noise. Keep the temperature cool. Limit screen use before bedtime. This includes: Not watching TV. Not using your smartphone, tablet, or computer. Stick to a routine that includes going to bed and waking up at the same times every day and night. This  can help you fall asleep faster. Consider making a quiet activity, such as reading, part of your nighttime routine. Try to avoid taking naps  during the day so that you sleep better at night. Get out of bed if you are still awake after 15 minutes of trying to sleep. Keep the lights down, but try reading or doing a quiet activity. When you feel sleepy, go back to bed. General instructions Take over-the-counter and prescription medicines only as told by your health care provider. Exercise regularly as told by your health care provider. However, avoid exercising in the hours right before bedtime. Use relaxation techniques to manage stress. Ask your health care provider to suggest some techniques that may work well for you. These may include: Breathing exercises. Routines to release muscle tension. Visualizing peaceful scenes. Make sure that you drive carefully. Do not drive if you feel very sleepy. Keep all follow-up visits. This is important. Contact a health care provider if: You are tired throughout the day. You have trouble in your daily routine due to sleepiness. You continue to have sleep problems, or your sleep problems get worse. Get help right away if: You have thoughts about hurting yourself or someone else. Get help right away if you feel like you may hurt yourself or others, or have thoughts about taking your own life. Go to your nearest emergency room or: Call 911. Call the National Suicide Prevention Lifeline at 928-562-6160 or 988. This is open 24 hours a day. Text the Crisis Text Line at 208 294 1605. Summary Insomnia is a sleep disorder that makes it difficult to fall asleep or stay asleep. Insomnia can be long-term (chronic) or short-term (acute). Treatment for insomnia depends on the cause. Treatment may focus on treating an underlying condition that is causing the insomnia. Keep a sleep diary to help you and your health care provider figure out what could be causing your insomnia. This information is not intended to replace advice given to you by your health care provider. Make sure you discuss any questions you have  with your health care provider. Document Revised: 04/25/2021 Document Reviewed: 04/25/2021 [employer] Patient Education  2024 [employer].

## 2024-01-20 ENCOUNTER — Encounter: Payer: Self-pay | Admitting: Family Medicine

## 2024-01-29 NOTE — Progress Notes (Deleted)
 Subjective:     Patient ID: [name], female    DOB: 1955/11/14, 68 y.o.   MRN: [id number]  No chief complaint on file.   HPI  [name] is a 84 to female presents for follow up chronic conditions  At the last office visit, concerns about a recurrent cyst at the previous back surgery incision site. The cyst was not present at that time, but she expressed concern it might refill or become infected.Pt was given doxycycline  prior OV and has completed this course  She had also been experiencing a chronic cough with clear, thick sputum for several months.  Managing the symptoms with hydration and Mucinex .  Follows with Cardiology- Dr. Court- [facility]- Dr. Delane  HTN Hydrochlorothiazide  12.5 mg daily, Telmisatran (MICARDIS ) 20 mg daily Echo 01/16/2024- WNL BP at home: ?***  Lasix  40 mg BID,  Nitroglycerin -??***** used?  HLD Omega 3 EFAs Statin???  Type 2 DM Empagliflozin  (Jardiance ) 10 mg tab  GERD- Pepcid  40 mg daily  Anxiety/ Depression Buspar  7.5 mg tab BID  Vitamin D  deficiency- 1000 units  Vit D daily; Vitamin B12 deficiency- 1000 mcg Vit B12 daily  Anemia Iron -Vit C-tab 65-125 daily   Reports taking medications as prescribed    Patient denies fever, chills, SOB, CP, palpitations, dyspnea, edema, HA, vision changes, N/V/D, abdominal pain, urinary symptoms, rash, weight changes, and recent illness or hospitalizations.   History of Present Illness              Health Maintenance Due  Topic Date Due   INFLUENZA VACCINE  12/28/2023    Past Medical History:  Diagnosis Date   Allergic rhinitis 07/05/2007   Qualifier: Diagnosis of   By: Lorane Browning         Allergic urticaria 03/27/2016   Allergy    Anemia 07/17/2016   Anxiety    Arthralgia 12/03/2016   Asthma    seasonal   Asthma with acute exacerbation 07/14/2014   ATTENTION DEFICIT DISORDER, INATTENTIVE TYPE 09/22/2009   Qualifier: Diagnosis of   By: Mahlon  MD, Comer      Replacing diagnoses that were inactivated after the 08/28/22 regulatory import     Baker's cyst of knee, right 02/26/2023   Blood transfusion without reported diagnosis    Breast cancer (HCC)    right   Colon polyps    CPAP (continuous positive airway pressure) dependence    DDD (degenerative disc disease), cervical 05/04/2021   Depression    Depression with anxiety 06/25/2006   Qualifier: Diagnosis of   By: Nicholaus Channel         Dermographia 03/27/2016   Diverticulitis of colon without hemorrhage 09/21/2014   Ear canal dryness 08/03/2011   Edema    Educated about COVID-19 virus infection 04/19/2020   Elevated sed rate 04/03/2022   Essential hypertension 06/25/2006   Qualifier: Diagnosis of   By: Nicholaus Channel         Family history of breast cancer in first degree relative    Fatigue 08/03/2011   Fibromyalgia    Gastroesophageal reflux disease without esophagitis 06/25/2006   Centricity Description: GERD  Qualifier: Diagnosis of   By: Nicholaus Channel      Centricity Description: ESOPHAGEAL REFLUX  Qualifier: Diagnosis of   By: Tish MD, Elsie Overall testing 05/15/2016   Negative for mutations within any of 32 genes on the Custom Cancer Panel through [employer].  No variants of  uncertain significance (VUSes were found).  This Custom Panel offered by [facility] Evert, MD) includes sequencing and/or deletion duplication testing of the following 34 genes: APC, ATM, AXIN2, BARD1, BMPR1A, BRCA1, BRCA2, BRIP1, CDH1, CDK4, CDKN2A, CHEK2, EPCAM, F   GERD (gastroesophageal reflux disease)    Gout of big toe 03/10/2013   Allergic to Allopurinol       History of breast cancer 03/27/2016   History of colonic polyps 08/14/2011   03/2006 - diminutive adenoma removed Ollen)  08/14/2011 - diminutive cecal adenoma removed  IMO SNOMED Dx Update Oct 2024     Hyperglycemia 03/05/2017   Hyperlipidemia 08/21/2016   no meds   Hypertension     controlled by medications   Insomnia 02/15/2009   Qualifier: Diagnosis of   By: Mahlon MD, Comer         Joint pain    Left arm pain 01/21/2021   Low back pain 01/11/2022   Memory changes 04/19/2020   Morbid obesity (HCC) 09/21/2010   Neck pain 05/02/2021   Neuromuscular disorder (HCC)    Fibromyalgia   Obesity    OSA (obstructive sleep apnea) 04/23/2012   12/13 Mod- AHI 15/h, corrected by CPAP 8 cm     Osteoarthritis    RA   Other peripheral vertigo, unspecified ear 10/27/2019   PAC (premature atrial contraction) 09/21/2010   Palpitations 09/21/2010   Peripheral neuropathy 04/19/2020   Gabapentin  caused edema     PONV (postoperative nausea and vomiting)    occasional   Pre-diabetes    Preventative health care 10/08/2012   Rheumatoid arthritis (HCC) 06/24/2015   History of diagnosis of RA is questionable. Not highly symptomatic at this time     Right hand pain 11/13/2022   Right knee pain 12/06/2016   RLS (restless legs syndrome) 06/07/2017   Spondylolisthesis of lumbar region 09/21/2021   TMJ arthralgia 04/19/2020   Type 2 diabetes mellitus with obesity (HCC) 12/10/2011   dx'd 11/2011     Urinary incontinence 10/27/2019   Vertigo 03/14/2018   Viral meningitis    Vitamin D  deficiency 03/11/2008   Qualifier: Diagnosis of   By: Mahlon MD, Comer          Past Surgical History:  Procedure Laterality Date   ABDOMINAL HYSTERECTOMY  1998   ADENOIDECTOMY     BACK SURGERY     09/21/21   BREAST EXCISIONAL BIOPSY Left 1993   benign   BREAST SURGERY     Tran flap due to breast cancer   CESAREAN SECTION  1988   COLONOSCOPY  08/14/2011   HAND SURGERY     dog bite, right hand   HAND SURGERY     trauma, left hand   KNEE ARTHROSCOPY     bilateral   left knee replacement  2010   LUMBAR WOUND DEBRIDEMENT N/A 10/07/2021   Procedure: LUMBAR WOUND DEBRIDEMENT/REVISION;  Surgeon: Gillie Duncans, MD;  Location: [facility] OR;  Service: Neurosurgery;  Laterality: N/A;  3C/RM 21 to  follow Dr Lanis   MASTECTOMY  01/13/1994   Right breast   parathyroid  resection     PARATHYROIDECTOMY     POLYPECTOMY     rectal abscess     SEPTOPLASTY  1980   SHOULDER ARTHROSCOPY Right 11/09/2015   Procedure: ARTHROSCOPY SHOULDER-acromioplasty, distal clavicle resection and debridement;  Surgeon: Maude Herald, MD;  Location: [facility] OR;  Service: Orthopedics;  Laterality: Right;   shoulder arthroscopy     rotator cuff repair   TOE SURGERY Right  paronychia and adenoma removed   TONSILLECTOMY     TOTAL KNEE ARTHROPLASTY  06/18/2008   Daldorf   TUMOR REMOVAL  1982   , scalp    Family History  Problem Relation Age of Onset   Emphysema Father    Lung cancer Father        lung ca dx 59   Other Father        prostate issues   Diabetes Father    Breast cancer Maternal Aunt        dx late 27s   Colon cancer Maternal Aunt        dx 89s   Colon polyps Maternal Aunt    Prostate cancer Maternal Grandfather 81       d. 85y   Heart disease Paternal Grandfather    Heart attack Paternal Grandfather        d. 50y   Diabetes Paternal Grandfather    Asthma Daughter        seasonal   Arthritis Maternal Grandmother    Aneurysm Maternal Grandmother        d. brain aneurysm at 70   Arthritis Mother    Colon polyps Mother    Hypertension Mother    Sleep apnea Mother    Multiple sclerosis Sister    Breast cancer Sister 28       L IDC and DCIS; ER/PR+, Her2-   Fibrocystic breast disease Sister    Arthritis/Rheumatoid Sister    Breast cancer Sister    Cancer Maternal Uncle        d. mouth cancer at younger age; smoker   Other Paternal Uncle        muscle issues - couldn't walk or talk; d. 20y   Cancer Maternal Aunt        lymphoma, dx 32s   Ovarian cancer Maternal Aunt        dx 46s; d. late 65s   Lung cancer Maternal Uncle        d. 72y; former smoker   Throat cancer Cousin        maternal 1st cousin; smoker   Breast cancer Cousin 33       maternal 1st cousin    Other Paternal Uncle        prostate issues   Breast cancer Cousin        paternal 1st cousin dx late 66s   Throat cancer Cousin        maternal 1st cousin; used SL tobacco   Prostate cancer Cousin        maternal 1st cousin dx 17s   Esophageal cancer Neg Hx    Rectal cancer Neg Hx    Stomach cancer Neg Hx     Social History   Socioeconomic History   Marital status: Widowed    Spouse name: Not on file   Number of children: 1   Years of education: Not on file   Highest education level: 12th grade  Occupational History   Occupation: ACCOUNTING    Employer: [employer]  Tobacco Use   Smoking status: Never   Smokeless tobacco: Never  Vaping Use   Vaping status: Never Used  Substance and Sexual Activity   Alcohol use: Not Currently   Drug use: No   Sexual activity: Not Currently  Other Topics Concern   Not on file  Social History Narrative   Not on file   Social Drivers of Health   Financial Resource Strain: Low  Risk  (12/13/2023)   Overall Financial Resource Strain (CARDIA)    Difficulty of Paying Living Expenses: Not hard at all  Food Insecurity: No Food Insecurity (12/13/2023)   Hunger Vital Sign    Worried About Running Out of Food in the Last Year: Never true    Ran Out of Food in the Last Year: Never true  Transportation Needs: No Transportation Needs (12/13/2023)   PRAPARE - Administrator, Civil Service (Medical): No    Lack of Transportation (Non-Medical): No  Physical Activity: Inactive (12/13/2023)   Exercise Vital Sign    Days of Exercise per Week: 0 days    Minutes of Exercise per Session: Not on file  Stress: No Stress Concern Present (12/13/2023)   [employer] of Occupational Health - Occupational Stress Questionnaire    Feeling of Stress: Only a little  Social Connections: Moderately Integrated (12/13/2023)   Social Connection and Isolation Panel    Frequency of Communication with Friends and Family: More than three times a week     Frequency of Social Gatherings with Friends and Family: Twice a week    Attends Religious Services: More than 4 times per year    Active Member of [employer] or Organizations: Yes    Attends Banker Meetings: More than 4 times per year    Marital Status: Widowed  Intimate Partner Violence: Not At Risk (12/20/2022)   Humiliation, Afraid, Rape, and Kick questionnaire    Fear of Current or Ex-Partner: No    Emotionally Abused: No    Physically Abused: No    Sexually Abused: No    Outpatient Medications Prior to Visit  Medication Sig Dispense Refill   acetaminophen  (TYLENOL ) 650 MG CR tablet Take 1,300 mg by mouth every 8 (eight) hours as needed for pain.     busPIRone  (BUSPAR ) 7.5 MG tablet Take 1 tablet (7.5 mg total) by mouth 2 (two) times daily. 60 tablet 2   Calcium  Carbonate-Vit D-Min (CALCIUM  600 + MINERALS) 600-200 MG-UNIT TABS Take 600 mg by mouth daily.     cholecalciferol  (VITAMIN D3) 25 MCG (1000 UNIT) tablet Take 1,000 Units by mouth daily. (Patient taking differently: Take 2,000 Units by mouth daily.)     Cyanocobalamin  (B-12) 1000 MCG CAPS Take 1 capsule by mouth daily at 6 (six) AM.     doxycycline  (VIBRA -TABS) 100 MG tablet Take 1 tablet (100 mg total) by mouth 2 (two) times daily. 20 tablet 0   empagliflozin  (JARDIANCE ) 10 MG TABS tablet Take 10 mg by mouth daily.     famotidine  (PEPCID ) 20 MG tablet Take 2 tablets (40 mg total) by mouth daily.     furosemide  (LASIX ) 40 MG tablet Take 1 tablet (40 mg total) by mouth 2 (two) times daily. 180 tablet 3   hydrochlorothiazide  (MICROZIDE ) 12.5 MG capsule TAKE 1 CAPSULE BY MOUTH DAILY 100 capsule 2   Iron -Vitamin C (VITRON-C) 65-125 MG TABS Take 1 tablet by mouth daily.     metoprolol  tartrate (LOPRESSOR ) 100 MG tablet Take 1 tablet (100mg ) TWO hours prior to CT scan 1 tablet 0   Multiple Vitamins-Minerals (CENTRUM SILVER ULTRA WOMENS) TABS Take 1 tablet by mouth daily.     nitroGLYCERIN  (NITROSTAT ) 0.4 MG SL tablet Place 1  tablet (0.4 mg total) under the tongue every 5 (five) minutes as needed for chest pain. 25 tablet 1   Omega 3-6-9 Fatty Acids (OMEGA 3-6-9 PO) Take 300 mg by mouth daily. Omega XL  peppermint oil liquid Apply topically as needed. Peppermint, wintergreen, black seed, basil and frankincense     telmisartan  (MICARDIS ) 20 MG tablet TAKE 1 TABLET BY MOUTH DAILY 100 tablet 2   No facility-administered medications prior to visit.    Allergies  Allergen Reactions   Allopurinol  Hives   Fluoxetine  Hives   Mucinex  [Guaifenesin  Er] Shortness Of Breath   Penicillins Hives, Other (See Comments) and Itching    Has patient had a PCN reaction causing immediate rash, facial/tongue/throat swelling, SOB or lightheadedness with hypotension: no  Has patient had a PCN reaction causing severe rash involving mucus membranes or skin necrosis: no  Has patient had a PCN reaction that required hospitalization no  Has patient had a PCN reaction occurring within the last 10 years: no  If all of the above answers are NO, then may proceed with Cephalosporin use.  Product containing penicillin (product)   Pregabalin  Swelling   Amitriptyline  Other (See Comments)    felt weird, fatigue, dizziness  amitriptyline    Bupropion  Other (See Comments)    Uncontrolled movements of tongue, hand   Escitalopram Oxalate Other (See Comments)    Nausea and hypersalivation.  escitalopram oxalate   Ozempic  (0.25 Or 0.5 Mg-Dose) [Semaglutide (0.25 Or 0.5mg -Dos)] Other (See Comments)    Abdominal pain   Prozac  [Fluoxetine  Hcl] Hives   Tirzepatide  Other (See Comments)   Rosuvastatin      ROS     Objective:    Physical Exam Expand All Collapse All    Subjective:    Subjective Patient ID: Icie Charleen [name], female    DOB: Sep 04, 1955, 68 y.o.   MRN: [id number]       Chief Complaint  Patient presents with   Wound Check      Incision from back surgery 2 years ago, tender    Anxiety      Patient reports  anxiety symptoms increasing, stress       HPI Discussed the use of AI scribe software for clinical note transcription with the patient, who gave verbal consent to proceed.   History of Present Illness Lien Briante Loveall is a 68 year old female who presents with concerns about a recurrent cyst at the incision site of her previous back surgery.   She is concerned about a recurrent cyst at the site of her previous back surgery incision. The cyst is currently not present, but she worries it may refill or become infected, especially given her diabetes and age. No fevers, chills, systemic symptoms, or drainage.   She has been experiencing a chronic cough with clear, thick sputum for several months. The sputum is not green or yellow. Previously, doxycycline  provided relief, but symptoms returned after discontinuation. She is currently managing the symptoms with hydration and Mucinex .   She experiences anxiety and sleep disturbances, including difficulty falling and staying asleep. She is taking Buspar  7.5 mg twice daily for anxiety. She has a history of adverse reactions to fluoxetine  and Lexapro, including hives and nausea, respectively. She uses a CPAP machine regularly but reports difficulty sleeping despite its use. Her stress is exacerbated by her mother's recent hospitalization and her daughter's cancer treatment.   No new chest pain or breathing trouble. She reports soreness at the site of a recent echocardiogram.           Past Medical History:  Diagnosis Date   Allergic rhinitis 07/05/2007    Qualifier: Diagnosis of   By: Lorane Browning         Allergic urticaria  03/27/2016   Allergy     Anemia 07/17/2016   Anxiety     Arthralgia 12/03/2016   Asthma      seasonal   Asthma with acute exacerbation 07/14/2014   ATTENTION DEFICIT DISORDER, INATTENTIVE TYPE 09/22/2009    Qualifier: Diagnosis of   By: Mahlon MD, Comer      Replacing diagnoses that were inactivated after the  08/28/22 regulatory import     Baker's cyst of knee, right 02/26/2023   Blood transfusion without reported diagnosis     Breast cancer (HCC)      right   Colon polyps     CPAP (continuous positive airway pressure) dependence     DDD (degenerative disc disease), cervical 05/04/2021   Depression     Depression with anxiety 06/25/2006    Qualifier: Diagnosis of   By: Nicholaus Channel         Dermographia 03/27/2016   Diverticulitis of colon without hemorrhage 09/21/2014   Ear canal dryness 08/03/2011   Edema     Educated about COVID-19 virus infection 04/19/2020   Elevated sed rate 04/03/2022   Essential hypertension 06/25/2006    Qualifier: Diagnosis of   By: Nicholaus Channel         Family history of breast cancer in first degree relative     Fatigue 08/03/2011   Fibromyalgia     Gastroesophageal reflux disease without esophagitis 06/25/2006    Centricity Description: GERD  Qualifier: Diagnosis of   By: Nicholaus Channel      Centricity Description: ESOPHAGEAL REFLUX  Qualifier: Diagnosis of   By: Tish MD, Elsie Overall testing 05/15/2016    Negative for mutations within any of 32 genes on the Custom Cancer Panel through [employer].  No variants of uncertain significance (VUSes were found).  This Custom Panel offered by [facility], MD) includes sequencing and/or deletion duplication testing of the following 34 genes: APC, ATM, AXIN2, BARD1, BMPR1A, BRCA1, BRCA2, BRIP1, CDH1, CDK4, CDKN2A, CHEK2, EPCAM, F   GERD (gastroesophageal reflux disease)     Gout of big toe 03/10/2013    Allergic to Allopurinol       History of breast cancer 03/27/2016   History of colonic polyps 08/14/2011    03/2006 - diminutive adenoma removed Ollen)  08/14/2011 - diminutive cecal adenoma removed  IMO SNOMED Dx Update Oct 2024     Hyperglycemia 03/05/2017   Hyperlipidemia 08/21/2016    no meds   Hypertension      controlled by medications   Insomnia 02/15/2009     Qualifier: Diagnosis of   By: Mahlon MD, Comer         Joint pain     Left arm pain 01/21/2021   Low back pain 01/11/2022   Memory changes 04/19/2020   Morbid obesity (HCC) 09/21/2010   Neck pain 05/02/2021   Neuromuscular disorder (HCC)      Fibromyalgia   Obesity     OSA (obstructive sleep apnea) 04/23/2012    12/13 Mod- AHI 15/h, corrected by CPAP 8 cm     Osteoarthritis      RA   Other peripheral vertigo, unspecified ear 10/27/2019   PAC (premature atrial contraction) 09/21/2010   Palpitations 09/21/2010   Peripheral neuropathy 04/19/2020    Gabapentin  caused edema     PONV (postoperative nausea and vomiting)      occasional   Pre-diabetes     Preventative health care 10/08/2012   Rheumatoid arthritis (  HCC) 06/24/2015    History of diagnosis of RA is questionable. Not highly symptomatic at this time     Right hand pain 11/13/2022   Right knee pain 12/06/2016   RLS (restless legs syndrome) 06/07/2017   Spondylolisthesis of lumbar region 09/21/2021   TMJ arthralgia 04/19/2020   Type 2 diabetes mellitus with obesity (HCC) 12/10/2011    dx'd 11/2011     Urinary incontinence 10/27/2019   Vertigo 03/14/2018   Viral meningitis     Vitamin D  deficiency 03/11/2008    Qualifier: Diagnosis of   By: Mahlon MD, Comer                     Past Surgical History:  Procedure Laterality Date   ABDOMINAL HYSTERECTOMY   1998   ADENOIDECTOMY       BACK SURGERY        09/21/21   BREAST EXCISIONAL BIOPSY Left 1993    benign   BREAST SURGERY        Tran flap due to breast cancer   CESAREAN SECTION   1988   COLONOSCOPY   08/14/2011   HAND SURGERY        dog bite, right hand   HAND SURGERY        trauma, left hand   KNEE ARTHROSCOPY        bilateral   left knee replacement   2010   LUMBAR WOUND DEBRIDEMENT N/A 10/07/2021    Procedure: LUMBAR WOUND DEBRIDEMENT/REVISION;  Surgeon: Gillie Duncans, MD;  Location: [facility] OR;  Service: Neurosurgery;  Laterality: N/A;  3C/RM 21 to  follow Dr Lanis   MASTECTOMY   01/13/1994    Right breast   parathyroid  resection       PARATHYROIDECTOMY       POLYPECTOMY       rectal abscess       SEPTOPLASTY   1980   SHOULDER ARTHROSCOPY Right 11/09/2015    Procedure: ARTHROSCOPY SHOULDER-acromioplasty, distal clavicle resection and debridement;  Surgeon: Maude Herald, MD;  Location: [facility] OR;  Service: Orthopedics;  Laterality: Right;   shoulder arthroscopy        rotator cuff repair   TOE SURGERY Right      paronychia and adenoma removed   TONSILLECTOMY       TOTAL KNEE ARTHROPLASTY   06/18/2008    Daldorf   TUMOR REMOVAL   1982    , scalp               Family History  Problem Relation Age of Onset   Emphysema Father     Lung cancer Father          lung ca dx 69   Other Father          prostate issues   Diabetes Father     Breast cancer Maternal Aunt          dx late 8s   Colon cancer Maternal Aunt          dx 72s   Colon polyps Maternal Aunt     Prostate cancer Maternal Grandfather 71        d. 85y   Heart disease Paternal Grandfather     Heart attack Paternal Grandfather          d. 50y   Diabetes Paternal Grandfather     Asthma Daughter          seasonal   Arthritis Maternal Grandmother  Aneurysm Maternal Grandmother          d. brain aneurysm at 74   Arthritis Mother     Colon polyps Mother     Hypertension Mother     Sleep apnea Mother     Multiple sclerosis Sister     Breast cancer Sister 73        L IDC and DCIS; ER/PR+, Her2-   Fibrocystic breast disease Sister     Arthritis/Rheumatoid Sister     Breast cancer Sister     Cancer Maternal Uncle          d. mouth cancer at younger age; smoker   Other Paternal Uncle          muscle issues - couldn't walk or talk; d. 20y   Cancer Maternal Aunt          lymphoma, dx 35s   Ovarian cancer Maternal Aunt          dx 65s; d. late 29s   Lung cancer Maternal Uncle          d. 87y; former smoker   Throat cancer Cousin          maternal  1st cousin; smoker   Breast cancer Cousin 73        maternal 1st cousin   Other Paternal Uncle          prostate issues   Breast cancer Cousin          paternal 1st cousin dx late 67s   Throat cancer Cousin          maternal 1st cousin; used SL tobacco   Prostate cancer Cousin          maternal 1st cousin dx 6s   Esophageal cancer Neg Hx     Rectal cancer Neg Hx     Stomach cancer Neg Hx            Social History         Socioeconomic History   Marital status: Widowed      Spouse name: Not on file   Number of children: 1   Years of education: Not on file   Highest education level: 12th grade  Occupational History   Occupation: ACCOUNTING      Employer: [employer]  Tobacco Use   Smoking status: Never   Smokeless tobacco: Never  Vaping Use   Vaping status: Never Used  Substance and Sexual Activity   Alcohol use: Not Currently   Drug use: No   Sexual activity: Not Currently  Other Topics Concern   Not on file  Social History Narrative   Not on file    Social Drivers of Health        Financial Resource Strain: Low Risk  (12/13/2023)    Overall Financial Resource Strain (CARDIA)     Difficulty of Paying Living Expenses: Not hard at all  Food Insecurity: No Food Insecurity (12/13/2023)    Hunger Vital Sign     Worried About Running Out of Food in the Last Year: Never true     Ran Out of Food in the Last Year: Never true  Transportation Needs: No Transportation Needs (12/13/2023)    PRAPARE - Therapist, art (Medical): No     Lack of Transportation (Non-Medical): No  Physical Activity: Inactive (12/13/2023)    Exercise Vital Sign     Days of Exercise per Week: 0 days  Minutes of Exercise per Session: Not on file  Stress: No Stress Concern Present (12/13/2023)    [employer] of Occupational Health - Occupational Stress Questionnaire     Feeling of Stress: Only a little  Social Connections: Moderately Integrated (12/13/2023)     Social Connection and Isolation Panel     Frequency of Communication with Friends and Family: More than three times a week     Frequency of Social Gatherings with Friends and Family: Twice a week     Attends Religious Services: More than 4 times per year     Active Member of [employer] or Organizations: Yes     Attends Banker Meetings: More than 4 times per year     Marital Status: Widowed  Intimate Partner Violence: Not At Risk (12/20/2022)    Humiliation, Afraid, Rape, and Kick questionnaire     Fear of Current or Ex-Partner: No     Emotionally Abused: No     Physically Abused: No     Sexually Abused: No            Outpatient Medications Prior to Visit  Medication Sig Dispense Refill   acetaminophen  (TYLENOL ) 650 MG CR tablet Take 1,300 mg by mouth every 8 (eight) hours as needed for pain.       busPIRone  (BUSPAR ) 7.5 MG tablet Take 1 tablet (7.5 mg total) by mouth 2 (two) times daily. 60 tablet 2   Calcium  Carbonate-Vit D-Min (CALCIUM  600 + MINERALS) 600-200 MG-UNIT TABS Take 600 mg by mouth daily.       cholecalciferol  (VITAMIN D3) 25 MCG (1000 UNIT) tablet Take 1,000 Units by mouth daily. (Patient taking differently: Take 2,000 Units by mouth daily.)       Cyanocobalamin  (B-12) 1000 MCG CAPS Take 1 capsule by mouth daily at 6 (six) AM.       empagliflozin  (JARDIANCE ) 10 MG TABS tablet Take 10 mg by mouth daily.       famotidine  (PEPCID ) 20 MG tablet Take 2 tablets (40 mg total) by mouth daily.       furosemide  (LASIX ) 40 MG tablet Take 1 tablet (40 mg total) by mouth 2 (two) times daily. 180 tablet 3   hydrochlorothiazide  (MICROZIDE ) 12.5 MG capsule TAKE 1 CAPSULE BY MOUTH DAILY 100 capsule 2   Iron -Vitamin C (VITRON-C) 65-125 MG TABS Take 1 tablet by mouth daily.       metoprolol  tartrate (LOPRESSOR ) 100 MG tablet Take 1 tablet (100mg ) TWO hours prior to CT scan 1 tablet 0   Multiple Vitamins-Minerals (CENTRUM SILVER ULTRA WOMENS) TABS Take 1 tablet by mouth daily.        nitroGLYCERIN  (NITROSTAT ) 0.4 MG SL tablet Place 1 tablet (0.4 mg total) under the tongue every 5 (five) minutes as needed for chest pain. 25 tablet 1   Omega 3-6-9 Fatty Acids (OMEGA 3-6-9 PO) Take 300 mg by mouth daily. Omega XL       peppermint oil liquid Apply topically as needed. Peppermint, wintergreen, black seed, basil and frankincense       telmisartan  (MICARDIS ) 20 MG tablet TAKE 1 TABLET BY MOUTH DAILY 100 tablet 2   doxycycline  (VIBRA -TABS) 100 MG tablet Take 1 tablet (100 mg total) by mouth 2 (two) times daily. 20 tablet 0      No facility-administered medications prior to visit.        Allergies       Allergies  Allergen Reactions   Allopurinol  Hives   Fluoxetine  Hives  Mucinex  [Guaifenesin  Er] Shortness Of Breath   Penicillins Hives, Other (See Comments) and Itching      Has patient had a PCN reaction causing immediate rash, facial/tongue/throat swelling, SOB or lightheadedness with hypotension: no   Has patient had a PCN reaction causing severe rash involving mucus membranes or skin necrosis: no   Has patient had a PCN reaction that required hospitalization no   Has patient had a PCN reaction occurring within the last 10 years: no   If all of the above answers are NO, then may proceed with Cephalosporin use.   Product containing penicillin (product)   Pregabalin  Swelling   Amitriptyline  Other (See Comments)      felt weird, fatigue, dizziness   amitriptyline    Bupropion  Other (See Comments)      Uncontrolled movements of tongue, hand   Escitalopram Oxalate Other (See Comments)      Nausea and hypersalivation.   escitalopram oxalate   Ozempic  (0.25 Or 0.5 Mg-Dose) [Semaglutide (0.25 Or 0.5mg -Dos)] Other (See Comments)      Abdominal pain   Prozac  [Fluoxetine  Hcl] Hives   Tirzepatide  Other (See Comments)   Rosuvastatin           Review of Systems  Constitutional:  Negative for fever and malaise/fatigue.  HENT:  Negative for congestion.   Eyes:   Negative for blurred vision.  Respiratory:  Positive for cough. Negative for shortness of breath.   Cardiovascular:  Positive for chest pain. Negative for palpitations and leg swelling.  Gastrointestinal:  Negative for abdominal pain, blood in stool and nausea.  Genitourinary:  Negative for dysuria and frequency.  Musculoskeletal:  Positive for back pain. Negative for falls.  Skin:  Negative for rash.  Neurological:  Negative for dizziness, loss of consciousness and headaches.  Endo/Heme/Allergies:  Negative for environmental allergies.  Psychiatric/Behavioral:  Positive for depression. The patient is nervous/anxious and has insomnia.          Objective:     Physical Exam Constitutional:      General: She is not in acute distress. +obese    Appearance: Normal appearance. She is well-developed. She is not toxic-appearing.  HENT:     Head: Normocephalic and atraumatic.     Right Ear: External ear normal.     Left Ear: External ear normal.     Nose: Nose normal.  Eyes:     General:        Right eye: No discharge.        Left eye: No discharge.     Conjunctiva/sclera: Conjunctivae normal.  Neck:     Thyroid : No thyromegaly.  Cardiovascular:     Rate and Rhythm: Normal rate and regular rhythm.     Heart sounds: Normal heart sounds. No murmur heard. Pulmonary:     Effort: Pulmonary effort is normal. No respiratory distress.     Breath sounds: Normal breath sounds.  Abdominal:     General: Bowel sounds are normal.     Palpations: Abdomen is soft.     Tenderness: There is no abdominal tenderness. There is no guarding.  Musculoskeletal:        General: Normal range of motion.     Cervical back: Neck supple.  Lymphadenopathy:     Cervical: No cervical adenopathy.  Skin:    General: Skin is warm and dry.     Findings: Lesion present.     Comments: Low back spinal incision healed but a cystic structure mildly erythematous at caudal aspect, no fluctuance or discharge  ***********  Neurological:     Mental Status: She is alert and oriented to person, place, and time.  Psychiatric:        Mood and Affect: Mood normal.        Behavior: Behavior normal.        Thought Content: Thought content normal.        Judgment: Judgment normal.     There were no vitals taken for this visit. Wt Readings from Last 3 Encounters:  01/17/24 279 lb (126.6 kg)  12/13/23 280 lb 3.2 oz (127.1 kg)  12/10/23 282 lb 12.8 oz (128.3 kg)       Assessment & Plan:   Problem List Items Addressed This Visit     Depression with anxiety - Primary   Stable on current medications. Denies SI/HI.       Essential hypertension   Well controlled, no changes to meds. Encouraged heart healthy diet such as the DASH diet and exercise as tolerated.        Hyperlipidemia   Stable on current medications. Encourage heart healthy diet such as MIND or DASH diet, increase exercise, avoid trans fats, simple carbohydrates and processed foods, consider a krill or fish or flaxseed oil cap daily.    Lab Results  Component Value Date   CHOL 141 12/13/2023   HDL 46.50 12/13/2023   LDLCALC 66 12/13/2023   LDLDIRECT 110.0 08/21/2016   TRIG 143.0 12/13/2023   CHOLHDL 3 12/13/2023         Obesity   Encouraged 150 minutes/week of moderate aerobic exercise and strength training twice weekly.  Discussed behavioral changes and goal setting; patient encouraged to track progress. Educated on MyPlate guidelines, for healthy balanced meals that includes portions of fruits, vegetables, whole grains, lean protein, and low-fat dairy to support healthy weight loss. Consider referral to nutritionist or weight management program if no improvement.       OSA (obstructive sleep apnea)   History of obstructive sleep apnea on CPAP        Type 2 diabetes mellitus with obesity (HCC)   hgba1c acceptable, minimize simple carbs. Increase exercise as tolerated. Continue current meds       Vitamin D  deficiency    Supplement and monitor.  Last vitamin D  Lab Results  Component Value Date   VD25OH 62.49 12/13/2023          Post-surgical cyst at back incision site Risk of recurrent infection due to diabetes and age. Current surgeon not preferred by her; alternative options available. - Monitor the incision site for signs of infection or cyst formation. - Prescribe doxycycline  if the site becomes red, hot, swollen, or starts draining. - Consider referral to a neurosurgeon or general surgeon if the cyst recurs or becomes problematic.   Chronic cough with sputum production and persistent throat pain - Encourage use of plain Mucinex  and adequate hydration. - Recommend nasal saline for hydration and flushing of nasal passages. - Advise use of Flonase  if allergy symptoms are present.  I am having Jonnell K. Brendle maintain her cholecalciferol , acetaminophen , B-12, empagliflozin , telmisartan , hydrochlorothiazide , Centrum Silver Ultra Womens, Iron -Vitamin C, Omega 3-6-9 Fatty Acids (OMEGA 3-6-9 PO), Calcium  600 + Minerals, peppermint oil, famotidine , metoprolol  tartrate, nitroGLYCERIN , furosemide , busPIRone , and doxycycline .  No orders of the defined types were placed in this encounter.

## 2024-01-29 NOTE — Assessment & Plan Note (Deleted)
 hgba1c acceptable, minimize simple carbs. Increase exercise as tolerated. Continue current meds

## 2024-01-29 NOTE — Assessment & Plan Note (Deleted)
 History of obstructive sleep apnea on CPAP.

## 2024-01-29 NOTE — Assessment & Plan Note (Deleted)
 Stable on current medications. Encourage heart healthy diet such as MIND or DASH diet, increase exercise, avoid trans fats, simple carbohydrates and processed foods, consider a krill or fish or flaxseed oil cap daily.    Lab Results  Component Value Date   CHOL 141 12/13/2023   HDL 46.50 12/13/2023   LDLCALC 66 12/13/2023   LDLDIRECT 110.0 08/21/2016   TRIG 143.0 12/13/2023   CHOLHDL 3 12/13/2023

## 2024-01-29 NOTE — Assessment & Plan Note (Deleted)
 Stable on current medications. Denies SI/HI.

## 2024-01-29 NOTE — Assessment & Plan Note (Deleted)
 Supplement and monitor.  Last vitamin D  Lab Results  Component Value Date   VD25OH 62.49 12/13/2023

## 2024-01-29 NOTE — Assessment & Plan Note (Deleted)
 Well controlled, no changes to meds. Encouraged heart healthy diet such as the DASH diet and exercise as tolerated.

## 2024-01-29 NOTE — Assessment & Plan Note (Deleted)
 Encouraged 150 minutes/week of moderate aerobic exercise and strength training twice weekly.  Discussed behavioral changes and goal setting; patient encouraged to track progress. Educated on MyPlate guidelines, for healthy balanced meals that includes portions of fruits, vegetables, whole grains, lean protein, and low-fat dairy to support healthy weight loss. Consider referral to nutritionist or weight management program if no improvement.

## 2024-01-30 ENCOUNTER — Encounter: Payer: Self-pay | Admitting: Pharmacist Clinician (PhC)/ Clinical Pharmacy Specialist

## 2024-01-30 ENCOUNTER — Ambulatory Visit: Attending: Cardiovascular Disease | Admitting: Pharmacist Clinician (PhC)/ Clinical Pharmacy Specialist

## 2024-01-30 DIAGNOSIS — E785 Hyperlipidemia, unspecified: Secondary | ICD-10-CM

## 2024-01-30 DIAGNOSIS — E782 Mixed hyperlipidemia: Secondary | ICD-10-CM | POA: Diagnosis not present

## 2024-01-30 NOTE — Progress Notes (Signed)
 Office Visit    Patient Name: [name] Date of Encounter: 01/30/2024  Primary Care Provider:  Domenica Harlene LABOR, MD Primary Cardiologist:  None  Chief Complaint    Hyperlipidemia   Significant Past Medical History   CAD 7/25 CAC = 519 (93rd percentile)  DM2 A1c, on Jardiance   HTN On metoprolol , hctz, telmisartan   OSA On CPAP  obesity Cannot tolerate GLP     Allergies  Allergen Reactions   Allopurinol  Hives   Fluoxetine  Hives   Mucinex  [Guaifenesin  Er] Shortness Of Breath   Penicillins Hives, Other (See Comments) and Itching    Has patient had a PCN reaction causing immediate rash, facial/tongue/throat swelling, SOB or lightheadedness with hypotension: no  Has patient had a PCN reaction causing severe rash involving mucus membranes or skin necrosis: no  Has patient had a PCN reaction that required hospitalization no  Has patient had a PCN reaction occurring within the last 10 years: no  If all of the above answers are NO, then may proceed with Cephalosporin use.  Product containing penicillin (product)   Pregabalin  Swelling   Amitriptyline  Other (See Comments)    felt weird, fatigue, dizziness  amitriptyline    Bupropion  Other (See Comments)    Uncontrolled movements of tongue, hand   Escitalopram Oxalate Other (See Comments)    Nausea and hypersalivation.  escitalopram oxalate   Ozempic  (0.25 Or 0.5 Mg-Dose) [Semaglutide (0.25 Or 0.5mg -Dos)] Other (See Comments)    Abdominal pain   Prozac  [Fluoxetine  Hcl] Hives   Tirzepatide  Other (See Comments)   Rosuvastatin      History of Present Illness    [name] is a 68 y.o. female patient of Dr Court, in the office today to discuss options for cholesterol management.  She was started on rosuvastatin  in April, but stopped taking in early July secondary to myalgias.    Insurance Carrier: [employer]  Pharmacy:   [facility] [address]  LDL Cholesterol goal:  LDL < 70  Current  Medications: none    Previously tried:  rosuvastatin , atorvastatin    Family Hx: no family history of CAD  Diet:  could not tolerate GLP, drinks only water , trying to watch her diet carefully    Exercise: limited by need for TKA, has to lose weight before surgery   Accessory Clinical Findings   Lab Results  Component Value Date   CHOL 141 12/13/2023   HDL 46.50 12/13/2023   LDLCALC 66 12/13/2023   LDLDIRECT 110.0 08/21/2016   TRIG 143.0 12/13/2023   CHOLHDL 3 12/13/2023    No results found for: LIPOA  Lab Results  Component Value Date   ALT 19 12/13/2023   AST 23 12/13/2023   ALKPHOS 71 12/13/2023   BILITOT 0.5 12/13/2023   Lab Results  Component Value Date   CREATININE 1.01 12/13/2023   BUN 15 12/13/2023   NA 139 12/13/2023   K 3.8 12/13/2023   CL 97 12/13/2023   CO2 30 12/13/2023   Lab Results  Component Value Date   HGBA1C 6.7 (H) 12/13/2023    Home Medications    Current Outpatient Medications  Medication Sig Dispense Refill   acetaminophen  (TYLENOL ) 650 MG CR tablet Take 1,300 mg by mouth every 8 (eight) hours as needed for pain.     busPIRone  (BUSPAR ) 7.5 MG tablet Take 1 tablet (7.5 mg total) by mouth 2 (two) times daily. 60 tablet 2   Calcium  Carbonate-Vit D-Min (CALCIUM  600 + MINERALS) 600-200 MG-UNIT TABS Take 600 mg by  mouth daily.     cholecalciferol  (VITAMIN D3) 25 MCG (1000 UNIT) tablet Take 1,000 Units by mouth daily. (Patient taking differently: Take 2,000 Units by mouth daily.)     Cyanocobalamin  (B-12) 1000 MCG CAPS Take 1 capsule by mouth daily at 6 (six) AM.     doxycycline  (VIBRA -TABS) 100 MG tablet Take 1 tablet (100 mg total) by mouth 2 (two) times daily. 20 tablet 0   empagliflozin  (JARDIANCE ) 10 MG TABS tablet Take 10 mg by mouth daily.     famotidine  (PEPCID ) 20 MG tablet Take 2 tablets (40 mg total) by mouth daily.     furosemide  (LASIX ) 40 MG tablet Take 1 tablet (40 mg total) by mouth 2 (two) times daily. 180 tablet 3    hydrochlorothiazide  (MICROZIDE ) 12.5 MG capsule TAKE 1 CAPSULE BY MOUTH DAILY 100 capsule 2   Iron -Vitamin C (VITRON-C) 65-125 MG TABS Take 1 tablet by mouth daily.     metoprolol  tartrate (LOPRESSOR ) 100 MG tablet Take 1 tablet (100mg ) TWO hours prior to CT scan 1 tablet 0   Multiple Vitamins-Minerals (CENTRUM SILVER ULTRA WOMENS) TABS Take 1 tablet by mouth daily.     nitroGLYCERIN  (NITROSTAT ) 0.4 MG SL tablet Place 1 tablet (0.4 mg total) under the tongue every 5 (five) minutes as needed for chest pain. 25 tablet 1   Omega 3-6-9 Fatty Acids (OMEGA 3-6-9 PO) Take 300 mg by mouth daily. Omega XL     peppermint oil liquid Apply topically as needed. Peppermint, wintergreen, black seed, basil and frankincense     telmisartan  (MICARDIS ) 20 MG tablet TAKE 1 TABLET BY MOUTH DAILY 100 tablet 2   No current facility-administered medications for this visit.     Assessment & Plan    Hyperlipidemia LDL goal <70 Assessment: Patient with ASCVD with last labs at LDL goal of < 70.  Was on rosuvastatin  at the time.  Since discontinued secondary to myalgia Most recent LDL 66 on 12/13/23 Not able to tolerate statins secondary to myalgias Reviewed options for lowering LDL cholesterol, including  PCSK-9 inhibitors and inclisiran.  Discussed mechanisms of action, dosing, side effects, potential decreases in LDL cholesterol and costs.  Also reviewed potential options for patient assistance.  Plan: Will need updated labs now off statin for 6-8 weeks Patient agreeable to starting Repatha 140 mg q14d if LDL > 70 Repeat labs after:  3 months Lipid Liver function    Allean Mink, PharmD CPP [facility] [address]   [place], [place] [address] [phone]  01/30/2024, 4:03 PM

## 2024-01-30 NOTE — Assessment & Plan Note (Signed)
 Assessment: Patient with ASCVD with last labs at LDL goal of < 70.  Was on rosuvastatin  at the time.  Since discontinued secondary to myalgia Most recent LDL 66 on 12/13/23 Not able to tolerate statins secondary to myalgias Reviewed options for lowering LDL cholesterol, including  PCSK-9 inhibitors and inclisiran.  Discussed mechanisms of action, dosing, side effects, potential decreases in LDL cholesterol and costs.  Also reviewed potential options for patient assistance.  Plan: Will need updated labs now off statin for 6-8 weeks Patient agreeable to starting Repatha 140 mg q14d if LDL > 70 Repeat labs after:  3 months Lipid Liver function

## 2024-01-30 NOTE — Patient Instructions (Signed)
 Your Results:             Your most recent labs Goal  Total Cholesterol 141 < 200  Triglycerides 143 < 150  HDL (happy/good cholesterol) 46.5 > 40  LDL (lousy/bad cholesterol 66 < 70   Medication changes:  We will start the process to get Repatha covered by your insurance.  Once the prior authorization is complete, I will call/send a MyChart message to let you know and confirm pharmacy information.   You will take one injection every 14 days  Lab orders:  Get cholesterol labs done in the next few days so that we can submit the request to your insurance. We want to repeat labs after 2-3 months.  We will send you a lab order to remind you once we get closer to that time.      Thank you for choosing [facility]

## 2024-01-31 ENCOUNTER — Ambulatory Visit: Admitting: Student

## 2024-01-31 ENCOUNTER — Ambulatory Visit: Admitting: Family Medicine

## 2024-01-31 DIAGNOSIS — E782 Mixed hyperlipidemia: Secondary | ICD-10-CM

## 2024-01-31 DIAGNOSIS — G4733 Obstructive sleep apnea (adult) (pediatric): Secondary | ICD-10-CM

## 2024-01-31 DIAGNOSIS — Z6841 Body Mass Index (BMI) 40.0 and over, adult: Secondary | ICD-10-CM

## 2024-01-31 DIAGNOSIS — E559 Vitamin D deficiency, unspecified: Secondary | ICD-10-CM

## 2024-01-31 DIAGNOSIS — F418 Other specified anxiety disorders: Secondary | ICD-10-CM

## 2024-01-31 DIAGNOSIS — E1169 Type 2 diabetes mellitus with other specified complication: Secondary | ICD-10-CM

## 2024-01-31 DIAGNOSIS — I10 Essential (primary) hypertension: Secondary | ICD-10-CM

## 2024-02-04 ENCOUNTER — Encounter: Payer: Self-pay | Admitting: Family Medicine

## 2024-02-05 LAB — LIPID PANEL

## 2024-02-06 ENCOUNTER — Other Ambulatory Visit: Payer: Self-pay | Admitting: Family Medicine

## 2024-02-06 ENCOUNTER — Telehealth: Payer: Self-pay | Admitting: Pharmacy Technician

## 2024-02-06 ENCOUNTER — Ambulatory Visit: Payer: Self-pay | Admitting: Pharmacist Clinician (PhC)/ Clinical Pharmacy Specialist

## 2024-02-06 ENCOUNTER — Other Ambulatory Visit (HOSPITAL_COMMUNITY): Payer: Self-pay

## 2024-02-06 ENCOUNTER — Telehealth: Payer: Self-pay | Admitting: Pharmacist Clinician (PhC)/ Clinical Pharmacy Specialist

## 2024-02-06 DIAGNOSIS — L089 Local infection of the skin and subcutaneous tissue, unspecified: Secondary | ICD-10-CM

## 2024-02-06 DIAGNOSIS — M5441 Lumbago with sciatica, right side: Secondary | ICD-10-CM

## 2024-02-06 LAB — LIPID PANEL
Cholesterol, Total: 160 mg/dL (ref 100–199)
HDL: 48 mg/dL (ref >39)
LDL CALC COMMENT:: 3.3 ratio (ref 0.0–4.4)
LDL Chol Calc (NIH): 89 mg/dL (ref 0–99)
Triglycerides: 132 mg/dL (ref 0–149)
VLDL Cholesterol Cal: 23 mg/dL (ref 5–40)

## 2024-02-06 NOTE — Telephone Encounter (Signed)
 Hi, her pa was approved and I got her a grant. I called and spoke to her and she said she can get the repatha at [facility] [place] if you can send the rx there, please. I already put the grant into wam with notes. Thank you!   Pharmacy Patient Advocate Encounter  Received notification from [employer] that Prior Authorization for repatha has been APPROVED from 02/06/24 to 08/05/24. Ran test claim, Copay is $235.73. This test claim was processed through [facility]- copay amounts may vary at other pharmacies due to pharmacy/plan contracts, or as the patient moves through the different stages of their insurance plan.   PA #/Case ID/Reference #: [id number]    Patient Advocate Encounter   The patient was approved for a [employer] grant that will help cover the cost of repatha Total amount awarded, 2500.  Effective: 01/07/24 - 01/05/25   APW:[id number] ERW:[id number] Hmnle:[id number] PI:[id number] [employer] ID: [id number]   Pharmacy provided with approval and processing information. Patient informed via  TELPEHONE/MYCHART

## 2024-02-06 NOTE — Telephone Encounter (Signed)
 Please do PA for Repatha

## 2024-02-06 NOTE — Telephone Encounter (Signed)
 Per pt calls  Pharmacy Patient Advocate Encounter   Received notification from Pt Calls Messages that prior authorization for REPATHA is required/requested.   Insurance verification completed.   The patient is insured through [facility] .   Per test claim: PA required; PA submitted to above mentioned insurance via Latent Key/confirmation #/EOC [facility] Status is pending

## 2024-02-06 NOTE — Telephone Encounter (Signed)
  Patient Advocate Encounter   The patient was approved for a [employer] grant that will help cover the cost of repatha Total amount awarded, 2500.  Effective: 01/07/24 - 01/05/25   APW:[id number] ERW:EKKEIFP Hmnle:[id number] PI:[id number] [employer] ID: [id number]   Pharmacy provided with approval and processing information. Patient informed via           TELPEHONE/MYCHART

## 2024-02-07 ENCOUNTER — Ambulatory Visit: Admitting: Cardiology

## 2024-02-11 ENCOUNTER — Ambulatory Visit (HOSPITAL_BASED_OUTPATIENT_CLINIC_OR_DEPARTMENT_OTHER)
Admission: RE | Admit: 2024-02-11 | Discharge: 2024-02-11 | Disposition: A | Discharge status: Home / Self Care | Source: Ambulatory Visit | Attending: Family Medicine | Admitting: Family Medicine

## 2024-02-11 DIAGNOSIS — L089 Local infection of the skin and subcutaneous tissue, unspecified: Secondary | ICD-10-CM | POA: Insufficient documentation

## 2024-02-11 DIAGNOSIS — M5441 Lumbago with sciatica, right side: Secondary | ICD-10-CM | POA: Insufficient documentation

## 2024-02-12 ENCOUNTER — Other Ambulatory Visit (HOSPITAL_BASED_OUTPATIENT_CLINIC_OR_DEPARTMENT_OTHER): Payer: Self-pay

## 2024-02-12 ENCOUNTER — Encounter: Payer: Self-pay | Admitting: Pharmacist Clinician (PhC)/ Clinical Pharmacy Specialist

## 2024-02-12 DIAGNOSIS — E782 Mixed hyperlipidemia: Secondary | ICD-10-CM

## 2024-02-12 MED ORDER — REPATHA SURECLICK 140 MG/ML ~~LOC~~ SOAJ
140.0000 mg | SUBCUTANEOUS | 3 refills | Status: AC
  Filled 2024-02-12: qty 6, 84d supply, fill #0
  Filled 2024-04-16: qty 6, 84d supply, fill #1
  Filled 2024-06-11: qty 6, 84d supply, fill #2

## 2024-02-12 NOTE — Addendum Note (Signed)
 Addended by: Kaedin Hicklin L on: 02/12/2024 03:06 PM   Modules accepted: Orders

## 2024-02-13 ENCOUNTER — Other Ambulatory Visit (HOSPITAL_BASED_OUTPATIENT_CLINIC_OR_DEPARTMENT_OTHER): Payer: Self-pay

## 2024-02-15 ENCOUNTER — Ambulatory Visit: Payer: Self-pay | Admitting: Family Medicine

## 2024-02-21 ENCOUNTER — Other Ambulatory Visit: Payer: Self-pay | Admitting: Family Medicine

## 2024-02-21 DIAGNOSIS — R700 Elevated erythrocyte sedimentation rate: Secondary | ICD-10-CM

## 2024-02-21 DIAGNOSIS — M5441 Lumbago with sciatica, right side: Secondary | ICD-10-CM

## 2024-02-21 DIAGNOSIS — M4316 Spondylolisthesis, lumbar region: Secondary | ICD-10-CM

## 2024-02-21 DIAGNOSIS — L989 Disorder of the skin and subcutaneous tissue, unspecified: Secondary | ICD-10-CM

## 2024-03-02 ENCOUNTER — Ambulatory Visit (HOSPITAL_BASED_OUTPATIENT_CLINIC_OR_DEPARTMENT_OTHER)
Admission: RE | Admit: 2024-03-02 | Discharge: 2024-03-02 | Disposition: A | Source: Ambulatory Visit | Attending: Family Medicine | Admitting: Family Medicine

## 2024-03-02 DIAGNOSIS — L989 Disorder of the skin and subcutaneous tissue, unspecified: Secondary | ICD-10-CM | POA: Insufficient documentation

## 2024-03-02 DIAGNOSIS — R700 Elevated erythrocyte sedimentation rate: Secondary | ICD-10-CM | POA: Diagnosis not present

## 2024-03-02 DIAGNOSIS — M4316 Spondylolisthesis, lumbar region: Secondary | ICD-10-CM | POA: Insufficient documentation

## 2024-03-02 DIAGNOSIS — M5441 Lumbago with sciatica, right side: Secondary | ICD-10-CM | POA: Diagnosis not present

## 2024-03-02 MED ORDER — GADOBUTROL 1 MMOL/ML IV SOLN
10.0000 mL | Freq: Once | INTRAVENOUS | Status: AC | PRN
  Administered 2024-03-02: 10 mL via INTRAVENOUS

## 2024-03-04 ENCOUNTER — Ambulatory Visit: Payer: Self-pay | Admitting: Family Medicine

## 2024-03-05 NOTE — Progress Notes (Signed)
 Cardiology Office Note:    Date:  03/11/2024   ID:  [name], DOB 10-29-1955, MRN [id number]  PCP:  Domenica Harlene LABOR, MD  Cardiologist:  Dorn Lesches, MD Cardiology APP:  [place] E, PA-C     Referring MD: Domenica Harlene LABOR, MD   Chief Complaint: follow-up of chest pain  History of Present Illness:    [name] is a 68 y.o. female with a history of chest pain with non-obstructive CAD noted on coronary CTA in 11/2023, hypertension, hyperlipidemia, type 2 diabetes mellitus, obstructive sleep apnea on CPAP, GERD, fibromyalgia, back pain, and remote breast cancer who is followed by Dr. Lesches and presents today for follow-up of chest pain.   Patient was seen by Dr. Edwyna in 09/2023 after not being seen by Cardiology in over 10 years at which time she reported chest pain. Coronary CTA and Echo were ordered for further evaluation but she never had these done. She was then seen by Dr. Lesches in 11/2023 at which time she reported fairly frequently chest pain over the last 3-4 months. Coronary CTA and Echo were again ordered. Coronary CTA showed a coronary calcium  score of 519 Agatston units (93rd percentile for age and sex) and mild non-obstructive CAD. FFR was negative. Echo showed LVEF of 60-65% with normal wall motion and diastolic function, normal RV function, and no significant valvular disease.   She presents today for follow-up. She is still having some chest pain but she thinks it is musculoskeletal. It is reproducible when she pushes on that area. She reports some chronic lower extremity edema every since being on Tamoxifen for breast cancer but states this is stable on Lasix  and HCTZ. She had one episode of palpitations several months ago but none since. No other cardiac complaints.   ROS: No shortness of breath, orthopnea, PND, palpitations (other than the 1 episode mentioned above), lightheadedness, dizziness, syncope.   EKGs/Labs/Other Studies Reviewed:     The following studies were reviewed:  Coronary CTA 12/18/2023: Impressions: 1. Coronary artery calcium  score 519 Agatston units. This places the patient in the 93rd percentile for age and gender, suggesting high risk for future cardiac events. 2. Nonobstructive disease in the coronaries, confirmed by CT FFR (no hemodynamically significant stenoses). _______________  Echocardiogram 01/16/2024: Impressions: 1. Left ventricular ejection fraction, by estimation, is 60 to 65%. The  left ventricle has normal function. The left ventricle has no regional  wall motion abnormalities. Left ventricular diastolic parameters were  normal.   2. Right ventricular systolic function is normal. The right ventricular  size is normal. Tricuspid regurgitation signal is inadequate for assessing  PA pressure.   3. The mitral valve is normal in structure. Trivial mitral valve  regurgitation. No evidence of mitral stenosis.   4. The aortic valve is tricuspid. There is mild thickening of the aortic  valve. Aortic valve regurgitation is not visualized. No aortic stenosis is  present.    EKG:  EKG not ordered today.   Recent Labs: 12/13/2023: ALT 19; BUN 15; Creatinine, Ser 1.01; Hemoglobin 12.2; Platelets 279.0; Potassium 3.8; Sodium 139; TSH 0.62  Recent Lipid Panel    Component Value Date/Time   CHOL 160 02/05/2024 0844   TRIG 132 02/05/2024 0844   HDL 48 02/05/2024 0844   CHOLHDL 3.3 02/05/2024 0844   CHOLHDL 3 12/13/2023 1623   VLDL 28.6 12/13/2023 1623   LDLCALC 89 02/05/2024 0844   LDLDIRECT 110.0 08/21/2016 1356    Physical Exam:  Vital Signs: BP 108/72   Pulse 88   Ht 5' 6 (1.676 m)   Wt 281 lb (127.5 kg)   SpO2 96%   BMI 45.35 kg/m     Wt Readings from Last 3 Encounters:  03/11/24 281 lb (127.5 kg)  01/17/24 279 lb (126.6 kg)  12/13/23 280 lb 3.2 oz (127.1 kg)     General: 68 y.o. obese Caucasian female in no acute distress. HEENT: Normocephalic and atraumatic. Sclera  clear.  Neck: Supple.No JVD. Heart: RRR. Distinct S1 and S2. No murmurs, gallops, or rubs.  Lungs: No increased work of breathing. Clear to ausculation bilaterally. No wheezes, rhonchi, or rales.  Extremities: No significant lower extremity edema.  Skin: Warm and dry. Neuro: No focal deficits. Psych: Normal affect. Responds appropriately.   Assessment:    1. Atypical chest pain   2. Non-obstructive CAD   3. Essential hypertension   4. Hyperlipidemia, unspecified hyperlipidemia type   5. Type 2 diabetes mellitus in patient with obesity (HCC)   6. OSA (obstructive sleep apnea)     Plan:    Atypical Chest Pain Non-Obstructive CAD Coronary CTA in 11/2023 showed coronary calcium  score of 519 Agatston units (93rd percentile for age and sex) and mild non-obstructive CAD. FFR was negative. Echo in 12/2023 showed normal LV function with no significant valvular disease.  - She continues to have atypical chest pain. It sounds musculoskeletal.  - Will start Aspirin  81mg  daily.  - Intolerant to statins. Continue Repatha . - No additional ischemic work-up necessary at this time. Pain sounds musculoskeletal. She has been taking OTC Acetaminophen  and Ibuprofen  as needed. Recommended trying to take one of these regularly (such as three times daily) for the next 1-2 weeks to see if that helps some of the musculoskeletal pain. If no improvement, recommend following up with PCP.   Hypertension BP well controlled.  - Continue current medications: Telmisartan  20mg  daily and HCTZ 12.5mg  daily.  - Also on Lasix  40mg  daily for lower extremity edema.   Hyperlipidemia Lipid panel in 01/2024: Total Cholesterol 160, Triglycerides 132, HDL 48, LDL 89. LDL goal <70 given.  - Recently started on Repatha .  - Being followed by our Lipid Clinic.   Type 2 Diabetes Mellitus Hemoglobin A1c 6.7% in 11/2023.  - On Jardiance .  - Management per PCP.   Obstructive Sleep Apnea - Continue CPAP.  Disposition: Follow up  in 1 year.    Signed, Aline FORBES Door, PA-C  03/11/2024 10:42 PM    [facility]

## 2024-03-06 ENCOUNTER — Encounter: Payer: Self-pay | Admitting: Orthopedic Surgery

## 2024-03-06 NOTE — Progress Notes (Addendum)
 Referring Physician:  Domenica Harlene LABOR, MD [address] [place],  [place] [address]  Primary Physician:  Domenica Harlene LABOR, MD  History of Present Illness: 03/13/2024 [name] has a history of HTN, right BBB, OSA, asthma, GERD, DM, peripheral neuropathy, gout, RA, ADD, FM, hyperparathyroidism, obesity, breast CA, hyperlipidemia, RLS, depression.   History of lumbar fusion L3-L5 on 09/21/21 by Dr. Gillie. She had persistent drainage and had debridement of lumbar wound on 10/07/21. She went to wound care and wound healed over time.   In January, she felt a knot near her incision. This drained x 2 months ago- first was clear and bloody and then was more greenish/brown/yellow. That happened 4-5 weeks ago.   She notes daily persistent drainage that is bloody to clear. She did well after her surgery. She has no back or leg pain. She notes some intermittent soreness over incision. She still feels like her back is weak. No numbness, tingling, or weakness.   Tobacco use: Does not smoke.   Bowel/Bladder Dysfunction: none  Conservative measures:  Physical therapy:  did PT PT for her back in 2023 after her surgery Multimodal medical therapy including regular antiinflammatories:  Doxycycline , Tylenol  Injections:  no epidural steroid injections  Past Surgery:  10/07/2021- LUMBAR WOUND DEBRIDEMENT  09/21/2021-LUMBAR THREE-FOUR, LUMBAR FOUR-FIVE POSTERIOR LUMBAR INTERBODY FUSION   The symptoms are causing a significant impact on the patient's life.   No symptoms of cervical myelopathy. No neck or arm pain.    Review of Systems:  A 10 point review of systems is negative, except for the pertinent positives and negatives detailed in the HPI.  Past Medical History: Past Medical History:  Diagnosis Date   Allergic rhinitis 07/05/2007   Qualifier: Diagnosis of   By: Lorane Browning         Allergic urticaria 03/27/2016   Allergy    Anemia 07/17/2016   Anxiety     Arthralgia 12/03/2016   Asthma    seasonal   Asthma with acute exacerbation 07/14/2014   ATTENTION DEFICIT DISORDER, INATTENTIVE TYPE 09/22/2009   Qualifier: Diagnosis of   By: Mahlon MD, Comer      Replacing diagnoses that were inactivated after the 08/28/22 regulatory import     Baker's cyst of knee, right 02/26/2023   Blood transfusion without reported diagnosis    Breast cancer (HCC)    right   Colon polyps    CPAP (continuous positive airway pressure) dependence    DDD (degenerative disc disease), cervical 05/04/2021   Depression    Depression with anxiety 06/25/2006   Qualifier: Diagnosis of   By: Nicholaus Channel         Dermographia 03/27/2016   Diverticulitis of colon without hemorrhage 09/21/2014   Ear canal dryness 08/03/2011   Edema    Educated about COVID-19 virus infection 04/19/2020   Elevated sed rate 04/03/2022   Essential hypertension 06/25/2006   Qualifier: Diagnosis of   By: Nicholaus Channel         Family history of breast cancer in first degree relative    Fatigue 08/03/2011   Fibromyalgia    Gastroesophageal reflux disease without esophagitis 06/25/2006   Centricity Description: GERD  Qualifier: Diagnosis of   By: Nicholaus Channel      Centricity Description: ESOPHAGEAL REFLUX  Qualifier: Diagnosis of   By: Tish MD, Elsie Overall testing 05/15/2016   Negative for mutations within any of 32 genes on  the Custom Cancer Panel through [employer].  No variants of uncertain significance (VUSes were found).  This Custom Panel offered by [facility] Evert, MD) includes sequencing and/or deletion duplication testing of the following 34 genes: APC, ATM, AXIN2, BARD1, BMPR1A, BRCA1, BRCA2, BRIP1, CDH1, CDK4, CDKN2A, CHEK2, EPCAM, F   GERD (gastroesophageal reflux disease)    Gout of big toe 03/10/2013   Allergic to Allopurinol       History of breast cancer 03/27/2016   History of colonic polyps 08/14/2011   03/2006 - diminutive adenoma  removed Ollen)  08/14/2011 - diminutive cecal adenoma removed  IMO SNOMED Dx Update Oct 2024     Hyperglycemia 03/05/2017   Hyperlipidemia 08/21/2016   no meds   Hypertension    controlled by medications   Insomnia 02/15/2009   Qualifier: Diagnosis of   By: Mahlon MD, Comer         Joint pain    Left arm pain 01/21/2021   Low back pain 01/11/2022   Memory changes 04/19/2020   Morbid obesity (HCC) 09/21/2010   Neck pain 05/02/2021   Neuromuscular disorder (HCC)    Fibromyalgia   Obesity    OSA (obstructive sleep apnea) 04/23/2012   12/13 Mod- AHI 15/h, corrected by CPAP 8 cm     Osteoarthritis    RA   Other peripheral vertigo, unspecified ear 10/27/2019   PAC (premature atrial contraction) 09/21/2010   Palpitations 09/21/2010   Peripheral neuropathy 04/19/2020   Gabapentin  caused edema     PONV (postoperative nausea and vomiting)    occasional   Pre-diabetes    Preventative health care 10/08/2012   Rheumatoid arthritis (HCC) 06/24/2015   History of diagnosis of RA is questionable. Not highly symptomatic at this time     Right hand pain 11/13/2022   Right knee pain 12/06/2016   RLS (restless legs syndrome) 06/07/2017   Spondylolisthesis of lumbar region 09/21/2021   TMJ arthralgia 04/19/2020   Type 2 diabetes mellitus with obesity 12/10/2011   dx'd 11/2011     Urinary incontinence 10/27/2019   Vertigo 03/14/2018   Viral meningitis    Vitamin D  deficiency 03/11/2008   Qualifier: Diagnosis of   By: Mahlon MD, Comer          Past Surgical History: Past Surgical History:  Procedure Laterality Date   ABDOMINAL HYSTERECTOMY  1998   ADENOIDECTOMY     BACK SURGERY     09/21/21   BREAST EXCISIONAL BIOPSY Left 1993   benign   BREAST SURGERY     Tran flap due to breast cancer   CESAREAN SECTION  1988   COLONOSCOPY  08/14/2011   HAND SURGERY     dog bite, right hand   HAND SURGERY     trauma, left hand   KNEE ARTHROSCOPY     bilateral   left knee replacement   2010   LUMBAR WOUND DEBRIDEMENT N/A 10/07/2021   Procedure: LUMBAR WOUND DEBRIDEMENT/REVISION;  Surgeon: Gillie Duncans, MD;  Location: [facility] OR;  Service: Neurosurgery;  Laterality: N/A;  3C/RM 21 to follow Dr Lanis   MASTECTOMY  01/13/1994   Right breast   parathyroid  resection     PARATHYROIDECTOMY     POLYPECTOMY     rectal abscess     SEPTOPLASTY  1980   SHOULDER ARTHROSCOPY Right 11/09/2015   Procedure: ARTHROSCOPY SHOULDER-acromioplasty, distal clavicle resection and debridement;  Surgeon: Maude Herald, MD;  Location: [facility] OR;  Service: Orthopedics;  Laterality: Right;   shoulder arthroscopy  rotator cuff repair   TOE SURGERY Right    paronychia and adenoma removed   TONSILLECTOMY     TOTAL KNEE ARTHROPLASTY  06/18/2008   Daldorf   TUMOR REMOVAL  1982   , scalp    Allergies: Allergies as of 03/13/2024 - Review Complete 03/11/2024  Allergen Reaction Noted   Allopurinol  Hives 03/19/2017   Fluoxetine  Hives 04/28/2016   Mucinex  [guaifenesin  er] Shortness Of Breath 01/28/2016   Penicillins Hives, Other (See Comments), and Itching 06/13/2007   Pregabalin  Swelling 04/03/2022   Amitriptyline  Other (See Comments) 10/04/2017   Bupropion  Other (See Comments) 08/09/2023   Escitalopram oxalate Other (See Comments) 07/11/2016   Ozempic  (0.25 or 0.5 mg-dose) [semaglutide (0.25 or 0.5mg -dos)] Other (See Comments) 11/13/2022   Prozac  [fluoxetine  hcl] Hives 04/28/2016   Tirzepatide  Other (See Comments) 08/09/2023   Rosuvastatin   12/13/2023    Medications: Outpatient Encounter Medications as of 03/13/2024  Medication Sig   acetaminophen  (TYLENOL ) 650 MG CR tablet Take 1,300 mg by mouth every 8 (eight) hours as needed for pain.   busPIRone  (BUSPAR ) 7.5 MG tablet Take 1 tablet (7.5 mg total) by mouth 2 (two) times daily.   Calcium  Carbonate-Vit D-Min (CALCIUM  600 + MINERALS) 600-200 MG-UNIT TABS Take 600 mg by mouth daily.   cholecalciferol  (VITAMIN D3) 25 MCG (1000 UNIT) tablet  Take 1,000 Units by mouth daily. (Patient taking differently: Take 2,000 Units by mouth daily.)   Cyanocobalamin  (B-12) 1000 MCG CAPS Take 1 capsule by mouth daily at 6 (six) AM.   empagliflozin  (JARDIANCE ) 10 MG TABS tablet Take 10 mg by mouth daily.   Evolocumab  (REPATHA  SURECLICK) 140 MG/ML SOAJ Inject 140 mg into the skin every 14 (fourteen) days.   famotidine  (PEPCID ) 20 MG tablet Take 2 tablets (40 mg total) by mouth daily.   furosemide  (LASIX ) 40 MG tablet Take 1 tablet (40 mg total) by mouth daily.   hydrochlorothiazide  (MICROZIDE ) 12.5 MG capsule TAKE 1 CAPSULE BY MOUTH DAILY   Iron -Vitamin C (VITRON-C) 65-125 MG TABS Take 1 tablet by mouth daily.   Multiple Vitamins-Minerals (CENTRUM SILVER ULTRA WOMENS) TABS Take 1 tablet by mouth daily.   nitroGLYCERIN  (NITROSTAT ) 0.4 MG SL tablet Place 1 tablet (0.4 mg total) under the tongue every 5 (five) minutes as needed for chest pain.   Omega 3-6-9 Fatty Acids (OMEGA 3-6-9 PO) Take 300 mg by mouth daily. Omega XL   peppermint oil liquid Apply topically as needed. Peppermint, wintergreen, black seed, basil and frankincense   telmisartan  (MICARDIS ) 20 MG tablet TAKE 1 TABLET BY MOUTH DAILY   [DISCONTINUED] busPIRone  (BUSPAR ) 7.5 MG tablet Take 1 tablet (7.5 mg total) by mouth 2 (two) times daily.   [DISCONTINUED] doxycycline  (VIBRA -TABS) 100 MG tablet Take 1 tablet (100 mg total) by mouth 2 (two) times daily. (Patient not taking: Reported on 03/11/2024)   [DISCONTINUED] furosemide  (LASIX ) 40 MG tablet Take 1 tablet (40 mg total) by mouth 2 (two) times daily.   [DISCONTINUED] metoprolol  tartrate (LOPRESSOR ) 100 MG tablet Take 1 tablet (100mg ) TWO hours prior to CT scan   No facility-administered encounter medications on file as of 03/13/2024.    Social History: Social History   Tobacco Use   Smoking status: Never   Smokeless tobacco: Never  Vaping Use   Vaping status: Never Used  Substance Use Topics   Alcohol use: Not Currently   Drug  use: No    Family Medical History: Family History  Problem Relation Age of Onset   Emphysema Father  Lung cancer Father        lung ca dx 14   Other Father        prostate issues   Diabetes Father    Breast cancer Maternal Aunt        dx late 60s   Colon cancer Maternal Aunt        dx 61s   Colon polyps Maternal Aunt    Prostate cancer Maternal Grandfather 67       d. 85y   Heart disease Paternal Grandfather    Heart attack Paternal Grandfather        d. 50y   Diabetes Paternal Grandfather    Asthma Daughter        seasonal   Arthritis Maternal Grandmother    Aneurysm Maternal Grandmother        d. brain aneurysm at 41   Arthritis Mother    Colon polyps Mother    Hypertension Mother    Sleep apnea Mother    Multiple sclerosis Sister    Breast cancer Sister 39       L IDC and DCIS; ER/PR+, Her2-   Fibrocystic breast disease Sister    Arthritis/Rheumatoid Sister    Breast cancer Sister    Cancer Maternal Uncle        d. mouth cancer at younger age; smoker   Other Paternal Uncle        muscle issues - couldn't walk or talk; d. 20y   Cancer Maternal Aunt        lymphoma, dx 26s   Ovarian cancer Maternal Aunt        dx 53s; d. late 109s   Lung cancer Maternal Uncle        d. 35y; former smoker   Throat cancer Cousin        maternal 1st cousin; smoker   Breast cancer Cousin 57       maternal 1st cousin   Other Paternal Uncle        prostate issues   Breast cancer Cousin        paternal 1st cousin dx late 98s   Throat cancer Cousin        maternal 1st cousin; used SL tobacco   Prostate cancer Cousin        maternal 1st cousin dx 46s   Esophageal cancer Neg Hx    Rectal cancer Neg Hx    Stomach cancer Neg Hx     Physical Examination: Vitals:   03/13/24 0956  BP: 124/76    General: Patient is well developed, well nourished, calm, collected, and in no apparent distress. Attention to examination is appropriate.  Respiratory: Patient is breathing  without any difficulty.   NEUROLOGICAL:     Awake, alert, oriented to person, place, and time.  Speech is clear and fluent. Fund of knowledge is appropriate.   Cranial Nerves: Pupils equal round and reactive to light.  Facial tone is symmetric.    Well healed lumbar incision. She has small area that is scabbed over. No drainage noted. No gross swelling. No tenderness.      No abnormal lesions on exposed skin.   Strength: Side Biceps Triceps Deltoid Interossei Grip Wrist Ext. Wrist Flex.  R 5 5 5 5 5 5 5   L 5 5 5 5 5 5 5    Side Iliopsoas Quads Hamstring PF DF EHL  R 5 5 5 5 5 5   L 5 5 5 5 5 5    Reflexes  are 2+ and symmetric at the biceps, brachioradialis, patella and achilles.   Hoffman's is present on right and absent on left.  Clonus is not present.   Bilateral upper and lower extremity sensation is intact to light touch.     Gait is normal.      Medical Decision Making  Imaging: Lumbar xrays dated 08/27/23:  FINDINGS: There is no evidence of lumbar spine fracture. Prior posterior fusion of L3 through L5 without malalignment. Anterior spurring of the lower thoracic and upper lumbar spine with mild narrow intervertebral space noted. Mild dextrocurvature of spine.   IMPRESSION: Prior posterior fusion of L3 through L5 without malalignment. Mild degenerative joint changes of lower thoracic and upper lumbar spine.     Electronically Signed   By: Craig Farr M.D.   On: 08/27/2023 11:59    Lumbar CT scan dated 02/11/24:  FINDINGS:   BONES AND ALIGNMENT: Posterior instrumented fusion spanning L3 to L5 with bilateral pedicle screws and vertical interlocking rods. Interbody spacers at the L3-4 and L4-5 levels with similar appearance of mild subsidence at the L3-4 level. Hardware is otherwise unremarkable. Trace anterolisthesis of L4 on L5 and L5 on S1.   DEGENERATIVE CHANGES: Mild degenerative changes of the sacroiliac joints. Degenerative endplate osteophytes at  multiple levels in the lumbar spine. There is a disc bulge and facet arthrosis at L2-3 resulting in at least moderate spinal canal stenosis. Streak artifact limits evaluation of the osseous spinal canal at L3-4 and L4-5. There is mild foraminal stenosis on the left at L2-3. There is at least mild bilateral foraminal stenosis at L3-4. Moderate foraminal stenosis on the left at L4-5.   SOFT TISSUES: No acute abnormality.   IMPRESSION: 1. Posterior instrumented fusion spanning L3 to L5. Mild subsidence at the L3-4 level. 2. Disc bulge and facet arthrosis at L2-3 resulting in at least moderate spinal canal stenosis and mild left foraminal stenosis. 3. At least mild bilateral foraminal stenosis at L3-4. 4. Moderate left foraminal stenosis at L4-5. 5. Mild degenerative changes of the sacroiliac joints.   Electronically signed by: Donnice Mania MD 02/15/2024 03:28 PM EDT RP Workstation: [id number]    Lumbar MRI dated 03/02/24:  FINDINGS: Bone marrow: No significant abnormality   Conus and cauda equina: No significant abnormality   Paraspinal tissues: No significant abnormality   L1-L2: The disc is normal.  Mild facet arthropathy   L2-L3: There is a very mild disc bulge. There is mild facet arthropathy with facet enlargement and mild spinal stenosis   L3-L4: Previous PLIF with posterolateral fusion. There is no spinal stenosis or significant foraminal stenosis   L4-L5: Previous PLIF with posterolateral fusion. No spinal stenosis or significant foraminal stenosis   L5-S1: The disc is normal. Moderate right facet arthropathy. No spinal stenosis or foraminal stenosis.   IMPRESSION: 1. Previous PLIF with posterolateral fusion at L3-L4 and L4-L5 2. Mild spinal stenosis at L2-3 due to facet arthropathy 3. Moderate right facet arthropathy at L5-S1   No significant change compared with the prior study from January 07, 2022     Electronically Signed   By: Nancyann Burns M.D.   On:  03/03/2024 16:59    I have personally reviewed the images and agree with the above interpretation.  Assessment and Plan: Ms. Battaglia has a history of lumbar fusion L3-L5 on 09/21/21 by Dr. Gillie. She had persistent drainage and had debridement of lumbar wound on 10/07/21. She went to wound care and wound healed over time.  In January of this year, she felt a knot near her incision. This drained x 2  a few months ago- first was clear and bloody and then was more greenish/brown/yellow. That happened 4-5 weeks ago.   She notes daily persistent drainage that is bloody to clear. She has no back or leg pain. She notes some intermittent soreness over incision. She still feels like her back is weak. No numbness, tingling, or weakness.   She is fused L3-L5. She has mild spinal stenosis L2-L3 and facet hypertrophy L5-S1.   No gross drainage, small scabbed over area inferior portion of her incision. No redness.   Treatment options discussed with patient and following plan made:   - Will review with Dr. Clois regarding further treatment options.  - Will message her with his plan.   I spent a total of 35 minutes in face-to-face and non-face-to-face activities related to this patient's care today including review of outside records, review of imaging, review of symptoms, physical exam, discussion of differential diagnosis, discussion of treatment options, and documentation.   Thank you for involving me in the care of this patient.   ADDENDUM 03/18/24:  Patient reviewed with Dr. Clois. He recommends that she be re-evaluated by the wound clinic. If they feel the wound needs to be surgically debrided, we can do that. Message sent to patient.   Glade Boys PA-C Dept. of Neurosurgery

## 2024-03-11 ENCOUNTER — Encounter: Payer: Self-pay | Admitting: Student

## 2024-03-11 ENCOUNTER — Ambulatory Visit: Attending: Student | Admitting: Student

## 2024-03-11 VITALS — BP 108/72 | HR 88 | Ht 66.0 in | Wt 281.0 lb

## 2024-03-11 DIAGNOSIS — E119 Type 2 diabetes mellitus without complications: Secondary | ICD-10-CM

## 2024-03-11 DIAGNOSIS — G4733 Obstructive sleep apnea (adult) (pediatric): Secondary | ICD-10-CM

## 2024-03-11 DIAGNOSIS — R0789 Other chest pain: Secondary | ICD-10-CM | POA: Diagnosis not present

## 2024-03-11 DIAGNOSIS — I2510 Atherosclerotic heart disease of native coronary artery without angina pectoris: Secondary | ICD-10-CM | POA: Diagnosis not present

## 2024-03-11 DIAGNOSIS — I10 Essential (primary) hypertension: Secondary | ICD-10-CM | POA: Diagnosis not present

## 2024-03-11 DIAGNOSIS — E669 Obesity, unspecified: Secondary | ICD-10-CM

## 2024-03-11 DIAGNOSIS — E785 Hyperlipidemia, unspecified: Secondary | ICD-10-CM

## 2024-03-11 MED ORDER — FUROSEMIDE 40 MG PO TABS: 40.0000 mg | ORAL_TABLET | Freq: Every day | ORAL | Status: AC

## 2024-03-11 NOTE — Patient Instructions (Signed)
 Thank you for choosing [facility]!     Medication Instructions:  Purchase baby aspirin  81mg . Take one tablet daily. You can purchase this at your local pharmacy.  You may also take over the counter tylenol  or ibuprofen  for the chest wall muscular discomfort.  If the discomfort persist, please follow up with your primary care provider.    *If you need a refill on your cardiac medications before your next appointment, please call your pharmacy*   Lab Work: No labs were ordered during today's visit.  If you have labs (blood work) drawn today and your tests are completely normal, you will receive your results only by: MyChart Message (if you have MyChart) OR A paper copy in the mail If you have any lab test that is abnormal or we need to change your treatment, we will call you to review the results.   Testing/Procedures: No procedures were ordered during today's visit.   Your next appointment:  A letter will be mailed to you as a reminder to call the office for your next follow up appointment.  1 year(s)   Provider:   Dorn Lesches, MD or Aline Door     Follow-Up: At [facility], you and your health needs are our priority.  As part of our continuing mission to provide you with exceptional heart care, we have created designated Provider Care Teams.  These Care Teams include your primary Cardiologist (physician) and Advanced Practice Providers (APPs -  Physician Assistants and Nurse Practitioners) who all work together to provide you with the care you need, when you need it. We recommend signing up for the patient portal called MyChart.  Sign up information is provided on this After Visit Summary.  MyChart is used to connect with patients for Virtual Visits (Telemedicine).  Patients are able to view lab/test results, encounter notes, upcoming appointments, etc.  Non-urgent messages can be sent to your provider as well.   To learn more about what you can do  with MyChart, go to [email or web address].

## 2024-03-12 ENCOUNTER — Other Ambulatory Visit: Payer: Self-pay | Admitting: *Deleted

## 2024-03-12 MED ORDER — BUSPIRONE HCL 7.5 MG PO TABS: 7.5000 mg | ORAL_TABLET | Freq: Two times a day (BID) | ORAL | 1 refills | Status: DC

## 2024-03-13 ENCOUNTER — Encounter: Payer: Self-pay | Admitting: Orthopedic Surgery

## 2024-03-13 ENCOUNTER — Ambulatory Visit (INDEPENDENT_AMBULATORY_CARE_PROVIDER_SITE_OTHER): Admitting: Orthopedic Surgery

## 2024-03-13 VITALS — BP 124/76 | Ht 66.0 in | Wt 282.2 lb

## 2024-03-13 DIAGNOSIS — L729 Follicular cyst of the skin and subcutaneous tissue, unspecified: Secondary | ICD-10-CM | POA: Diagnosis not present

## 2024-03-13 DIAGNOSIS — M48061 Spinal stenosis, lumbar region without neurogenic claudication: Secondary | ICD-10-CM | POA: Diagnosis not present

## 2024-03-13 DIAGNOSIS — Z981 Arthrodesis status: Secondary | ICD-10-CM | POA: Diagnosis not present

## 2024-03-13 DIAGNOSIS — T148XXA Other injury of unspecified body region, initial encounter: Secondary | ICD-10-CM

## 2024-03-18 DIAGNOSIS — T148XXA Other injury of unspecified body region, initial encounter: Secondary | ICD-10-CM

## 2024-03-18 DIAGNOSIS — Z981 Arthrodesis status: Secondary | ICD-10-CM

## 2024-03-26 ENCOUNTER — Encounter (HOSPITAL_BASED_OUTPATIENT_CLINIC_OR_DEPARTMENT_OTHER): Attending: General Surgery | Admitting: General Surgery

## 2024-03-26 DIAGNOSIS — Y838 Other surgical procedures as the cause of abnormal reaction of the patient, or of later complication, without mention of misadventure at the time of the procedure: Secondary | ICD-10-CM | POA: Insufficient documentation

## 2024-03-26 DIAGNOSIS — E11622 Type 2 diabetes mellitus with other skin ulcer: Secondary | ICD-10-CM | POA: Insufficient documentation

## 2024-03-26 DIAGNOSIS — L97429 Non-pressure chronic ulcer of left heel and midfoot with unspecified severity: Secondary | ICD-10-CM | POA: Diagnosis not present

## 2024-03-26 DIAGNOSIS — L98429 Non-pressure chronic ulcer of back with unspecified severity: Secondary | ICD-10-CM | POA: Diagnosis not present

## 2024-03-26 DIAGNOSIS — T8131XA Disruption of external operation (surgical) wound, not elsewhere classified, initial encounter: Secondary | ICD-10-CM | POA: Insufficient documentation

## 2024-03-26 DIAGNOSIS — L02212 Cutaneous abscess of back [any part, except buttock]: Secondary | ICD-10-CM | POA: Insufficient documentation

## 2024-04-03 ENCOUNTER — Encounter (HOSPITAL_BASED_OUTPATIENT_CLINIC_OR_DEPARTMENT_OTHER): Attending: General Surgery | Admitting: General Surgery

## 2024-04-03 DIAGNOSIS — L98422 Non-pressure chronic ulcer of back with fat layer exposed: Secondary | ICD-10-CM | POA: Insufficient documentation

## 2024-04-03 DIAGNOSIS — E11622 Type 2 diabetes mellitus with other skin ulcer: Secondary | ICD-10-CM | POA: Insufficient documentation

## 2024-04-03 DIAGNOSIS — L02212 Cutaneous abscess of back [any part, except buttock]: Secondary | ICD-10-CM | POA: Insufficient documentation

## 2024-04-03 DIAGNOSIS — T8131XS Disruption of external operation (surgical) wound, not elsewhere classified, sequela: Secondary | ICD-10-CM | POA: Insufficient documentation

## 2024-04-08 ENCOUNTER — Encounter (HOSPITAL_BASED_OUTPATIENT_CLINIC_OR_DEPARTMENT_OTHER): Admitting: General Surgery

## 2024-04-11 ENCOUNTER — Encounter (HOSPITAL_BASED_OUTPATIENT_CLINIC_OR_DEPARTMENT_OTHER): Admitting: General Surgery

## 2024-04-14 ENCOUNTER — Telehealth: Payer: Self-pay | Admitting: Pharmacy Technician

## 2024-04-14 NOTE — Telephone Encounter (Signed)
 Hi, can someone please get the provider to sign the form that is scanned in media under [employer] ([employer]) PROVIDER APPLICATION for this patient and fax back to [phone]? Thank you!

## 2024-04-14 NOTE — Telephone Encounter (Signed)
 Emailed jardiance  application to the patient as requested for 2026 enrollment  Her current enrollment is good until 05/28/24

## 2024-04-15 ENCOUNTER — Encounter: Payer: Self-pay | Admitting: Pharmacist

## 2024-04-15 NOTE — Telephone Encounter (Signed)
 Update- Eckard, Tammy, RPH-CPP to take care of this now. Thank you

## 2024-04-16 ENCOUNTER — Encounter: Payer: Self-pay | Admitting: Orthopedic Surgery

## 2024-04-16 ENCOUNTER — Other Ambulatory Visit (HOSPITAL_BASED_OUTPATIENT_CLINIC_OR_DEPARTMENT_OTHER): Payer: Self-pay

## 2024-04-16 ENCOUNTER — Other Ambulatory Visit: Admitting: Pharmacist

## 2024-04-16 DIAGNOSIS — Z789 Other specified health status: Secondary | ICD-10-CM

## 2024-04-16 DIAGNOSIS — E669 Obesity, unspecified: Secondary | ICD-10-CM

## 2024-04-16 DIAGNOSIS — T466X5A Adverse effect of antihyperlipidemic and antiarteriosclerotic drugs, initial encounter: Secondary | ICD-10-CM | POA: Insufficient documentation

## 2024-04-16 DIAGNOSIS — E785 Hyperlipidemia, unspecified: Secondary | ICD-10-CM

## 2024-04-16 NOTE — Progress Notes (Signed)
 04/16/2024 Name: [name] MRN: [id number] DOB: 1956/02/07  Chief Complaint  Patient presents with   Medication Management    [name] is a 68 y.o. year old female. Patient was seen in the office for a visit today.   They were referred to the pharmacist by their PCP for assistance in managing medication access.   Subjective:  Patient mentions she has been having sinus congestion the last 2 days. She has been taking an over-the-counter sinus tablet and used facial heating pad. She read ingredients to me - contains acetaminophen , guaifenesin  and phenylephrine . Patient denies fever or cough. Only nasal congestion and pressure.   Patient also mentions she will have a wound vac placed Friday 04/18/2024. She has been having discharge from an old scar from back surgery about 2 years ago. She began to see surgeon again and it was discovered that she had infection in the old surgical site. Per patient she took Levaquin for 10 days in last October.    Medication Access/Adherence  Current Pharmacy:  [facility] [address] [place] - [address] [place] [address] Phone: [phone] Fax: [phone]  [facility] - [place], [place] - [address] [place] [address] Phone: [phone] Fax: [phone]  [facility] [address] [place] [address] Phone: [phone] Fax: [phone]   Patient reports affordability concerns with their medications: Yes  - she received Jardiance  form [employer] for 2025 and [employer] to help with cost of Repatha .  Patient reports access/transportation concerns to their pharmacy: No  Patient reports adherence concerns with their medications:  No      Diabetes:  Current medications: Jardiance  10mg  once a daily - Approved to get thru [employer] thru  05/28/2024  Medications tried in the past: Mounjaro  and ozempic  - caused abdominal pain and burning; metfomrin - stopped due to patient's concerns with side effects.   Patient denies hypoglycemic s/sx including no dizziness, shakiness, sweating. Patient denies hyperglycemic symptoms including no polyuria, polydipsia, polyphagia, nocturia, neuropathy, blurred vision.  Macrovascular and Microvascular Risk Reduction:  Statin? no; patient previously intolerant to statin therapy; She is taking Repatha  140mg  every 14 days.  ACEi/ARB? yes (telmisartan ) Last urinary albumin /creatinine ratio:  Lab Results  Component Value Date   MICRALBCREAT Unable to calculate 12/13/2023   Last eye exam:  Lab Results  Component Value Date   HMDIABEYEEXA  07/09/2023     Comment:     Unable to Determine. Abstracted by HIM   Last foot exam: 08/09/2023 Tobacco Use:  Tobacco Use: Low Risk  (03/13/2024)   Patient History    Smoking Tobacco Use: Never    Smokeless Tobacco Use: Never    Passive Exposure: Not on file     Hyperlipidemia/ASCVD Risk Reduction  Current lipid lowering medications: Repatha  140mg  every 14 days.   Past cholesterol medications tried:  Tried Rosuvastatin  5mg  daily earlier in 2025; stopped due to fatigue and myalgias Atorvastatin  - 10mg  twice a week - took in 2022. Noted that it was stopped due to patient preference   Per Dr Edwyna - he would like to see patient's LDL < 60 due to diabetes and CVD risk.   Cholesterol medications tried in the past:     Objective:  Lab Results  Component Value Date   HGBA1C 6.7 (H) 12/13/2023    Lab Results  Component Value Date   CREATININE 1.01 12/13/2023   BUN 15 12/13/2023   NA 139 12/13/2023   K 3.8 12/13/2023   CL 97 12/13/2023   CO2 30 12/13/2023    Lab Results  Component Value Date   CHOL 160 02/05/2024   HDL 48 02/05/2024   LDLCALC 89 02/05/2024   LDLDIRECT 110.0 08/21/2016   TRIG 132 02/05/2024   CHOLHDL 3.3  02/05/2024    Medications Reviewed Today     Reviewed by Carla Milling, RPH-CPP (Pharmacist) on 04/16/24 at 1054  Med List Status: <None>   Medication Order Taking? Sig Documenting Provider Last Dose Status Informant  acetaminophen  (TYLENOL ) 650 MG CR tablet [id number]  Take 1,300 mg by mouth every 8 (eight) hours as needed for pain. [provider]  Active Self, Pharmacy Records  busPIRone  (BUSPAR ) 7.5 MG tablet [id number]  Take 1 tablet (7.5 mg total) by mouth 2 (two) times daily. Domenica Harlene LABOR, MD  Active   Calcium  Carbonate-Vit D-Min (CALCIUM  600 + MINERALS) 600-200 MG-UNIT TABS [id number]  Take 600 mg by mouth daily. [provider]  Active   cholecalciferol  (VITAMIN D3) 25 MCG (1000 UNIT) tablet [id number]  Take 1,000 Units by mouth daily.  Patient taking differently: Take 2,000 Units by mouth daily.   [provider]  Active Self, Pharmacy Records  Cyanocobalamin  (B-12) 1000 MCG CAPS [id number]  Take 1 capsule by mouth daily at 6 (six) AM. Daryl Setter, NP  Active Self, Pharmacy Records  empagliflozin  (JARDIANCE ) 10 MG TABS tablet [id number]  Take 10 mg by mouth daily. [provider]  Active            Med Note JUSTINO, Roshan Salamon B   Thu Oct 04, 2023  1:45 PM) BI Care Program thru 05/28/2024  Evolocumab  (REPATHA  SURECLICK) 140 MG/ML SOAJ [id number]  Inject 140 mg into the skin every 14 (fourteen) days. Court Dorn PARAS, MD  Active   famotidine  (PEPCID ) 20 MG tablet [id number]  Take 2 tablets (40 mg total) by mouth daily. Domenica Harlene LABOR, MD  Active   furosemide  (LASIX ) 40 MG tablet [id number]  Take 1 tablet (40 mg total) by mouth daily. Goodrich, Callie E, PA-C  Active   hydrochlorothiazide  (MICROZIDE ) 12.5 MG capsule [id number]  TAKE 1 CAPSULE BY MOUTH DAILY Domenica Harlene LABOR, MD  Active   Iron -Vitamin C (VITRON-C) 65-125 MG TABS [id number]  Take 1 tablet by mouth daily. [provider]  Active   Multiple Vitamins-Minerals (CENTRUM SILVER ULTRA  WOMENS) TABS [id number]  Take 1 tablet by mouth daily. [provider]  Active   nitroGLYCERIN  (NITROSTAT ) 0.4 MG SL tablet [id number]  Place 1 tablet (0.4 mg total) under the tongue every 5 (five) minutes as needed for chest pain. Domenica Harlene LABOR, MD  Active   Omega 3-6-9 Fatty Acids (OMEGA 3-6-9 PO) [id number]  Take 300 mg by mouth daily. Omega XL [provider]  Active   peppermint oil liquid [id number]  Apply topically as needed. Peppermint, wintergreen, black seed, basil and frankincense [provider]  Active   telmisartan  (MICARDIS ) 20 MG tablet [id number]  TAKE 1 TABLET BY MOUTH DAILY Domenica Harlene LABOR, MD  Active               Assessment/Plan:   Diabetes: Last A1c was at goal of < 7.0  - Recommend to continue Jardiance  10mg  daily. Completed 2026 renewal application for [employer] medication assistance program  - Approved thru 05/28/2024 to received Jardiance  from [facility]  program.   Hyperlipidemia/ASCVD Risk Reduction: LDL goal < 60 per cardiologist notes 10/03/2023 - not at goal currently - Assisted patient with requesting Repatha  from pharmacy and using [employer] grant to cover cost.   Sinus Pressure and congestion:  - Recommended she not use over-the-counter product with oral decongestant due to possibility it could increase HR or blood pressure and lack of effectiveness.  - Recommended she use fluticasone  nasal spray - 1 to 2 sprays in each nostril 1 or 2 times per day - She can also use Afrin nasal decongestant for up to 3 days or saline nasal spray.  - maintain adequate hydration.  - continue to use moist heat / humidification for sinus relief.  - She is to contact office if she has fever > 100 F or if symptoms worsen or do not improved over the next 3 to 5 days.    Follow Up Plan: 4 to 6 weeks check on Jardiance  medication assistance program approval.   Madelin Ray, PharmD Clinical Pharmacist [facility]

## 2024-04-18 ENCOUNTER — Encounter (HOSPITAL_BASED_OUTPATIENT_CLINIC_OR_DEPARTMENT_OTHER): Admitting: General Surgery

## 2024-04-30 ENCOUNTER — Encounter (HOSPITAL_BASED_OUTPATIENT_CLINIC_OR_DEPARTMENT_OTHER): Attending: General Surgery | Admitting: General Surgery

## 2024-04-30 DIAGNOSIS — E11622 Type 2 diabetes mellitus with other skin ulcer: Secondary | ICD-10-CM | POA: Insufficient documentation

## 2024-04-30 DIAGNOSIS — L98422 Non-pressure chronic ulcer of back with fat layer exposed: Secondary | ICD-10-CM | POA: Insufficient documentation

## 2024-04-30 DIAGNOSIS — L02212 Cutaneous abscess of back [any part, except buttock]: Secondary | ICD-10-CM | POA: Insufficient documentation

## 2024-04-30 DIAGNOSIS — T8131XS Disruption of external operation (surgical) wound, not elsewhere classified, sequela: Secondary | ICD-10-CM | POA: Insufficient documentation

## 2024-04-30 DIAGNOSIS — X58XXXS Exposure to other specified factors, sequela: Secondary | ICD-10-CM | POA: Insufficient documentation

## 2024-05-07 ENCOUNTER — Ambulatory Visit: Payer: Medicare Other

## 2024-05-07 VITALS — Ht 66.0 in | Wt 279.0 lb

## 2024-05-07 DIAGNOSIS — Z0000 Encounter for general adult medical examination without abnormal findings: Secondary | ICD-10-CM

## 2024-05-07 NOTE — Assessment & Plan Note (Signed)
 Stable on current medications, denies SI/HI.

## 2024-05-07 NOTE — Assessment & Plan Note (Signed)
 LDL goal >70.  Managed with Repatha  Encourage heart healthy diet such as MIND or DASH diet, increase exercise, avoid trans fats, simple carbohydrates and processed foods, consider a krill or fish or flaxseed oil cap daily.

## 2024-05-07 NOTE — Progress Notes (Signed)
 Please attest this visit in the absence of patient primary care provider.    Chief Complaint  Patient presents with   Medicare Wellness     Subjective:   [name] is a 68 y.o. female who presents for a Medicare Annual Wellness Visit.  Visit info / Clinical Intake: Medicare Wellness Visit Type:: Subsequent Annual Wellness Visit Persons participating in visit and providing information:: patient Medicare Wellness Visit Mode:: Telephone If telephone:: video declined Since this visit was completed virtually, some vitals may be partially provided or unavailable. Missing vitals are due to the limitations of the virtual format.: Unable to obtain vitals - no equipment If Telephone or Video please confirm:: I connected with patient using audio/video enable telemedicine. I verified patient identity with two identifiers, discussed telehealth limitations, and patient agreed to proceed. Patient Location:: home Provider Location:: office Interpreter Needed?: No Pre-visit prep was completed: yes AWV questionnaire completed by patient prior to visit?: yes Date:: 05/07/24 Living arrangements:: (!) (Patient-Rptd) lives alone Patient's Overall Health Status Rating: (Patient-Rptd) good Typical amount of pain: (Patient-Rptd) some Does pain affect daily life?: (!) (Patient-Rptd) yes Are you currently prescribed opioids?: no  Dietary Habits and Nutritional Risks How many meals a day?: (Patient-Rptd) 2 Eats fruit and vegetables daily?: (Patient-Rptd) yes Most meals are obtained by: (Patient-Rptd) preparing own meals In the last 2 weeks, have you had any of the following?: none Diabetic:: (!) yes Any non-healing wounds?: (!) yes (infection in previous surgical site. currently under treatment at wound center.) How often do you check your BS?: 0 Would you like to be referred to a Nutritionist or for Diabetic Management? : no  Functional Status Activities of Daily Living (to include  ambulation/medication): (Patient-Rptd) Independent Ambulation: (Patient-Rptd) Independent Medication Administration: (Patient-Rptd) Independent Home Management (perform basic housework or laundry): (Patient-Rptd) Independent Manage your own finances?: (Patient-Rptd) yes Primary transportation is: (Patient-Rptd) driving Concerns about vision?: no *vision screening is required for WTM* (up to date with [facility]) Concerns about hearing?: no  Fall Screening Falls in the past year?: (Patient-Rptd) 0 Number of falls in past year: 0 Was there an injury with Fall?: 0 Fall Risk Category Calculator: 0 Patient Fall Risk Level: Low Fall Risk  Fall Risk Patient at Risk for Falls Due to: Orthopedic patient Fall risk Follow up: Falls evaluation completed  Home and Transportation Safety: All rugs have non-skid backing?: (Patient-Rptd) yes All stairs or steps have railings?: (Patient-Rptd) N/A, no stairs Grab bars in the bathtub or shower?: (Patient-Rptd) yes Have non-skid surface in bathtub or shower?: (Patient-Rptd) yes Good home lighting?: (Patient-Rptd) yes Regular seat belt use?: (Patient-Rptd) yes Hospital stays in the last year:: (Patient-Rptd) no  Cognitive Assessment Difficulty concentrating, remembering, or making decisions? : (Patient-Rptd) no Will 6CIT or Mini Cog be Completed: yes What year is it?: 0 points What month is it?: 0 points Give patient an address phrase to remember (5 components): [address] [place]  About what time is it?: 0 points Count backwards from 20 to 1: 0 points Say the months of the year in reverse: 0 points Repeat the address phrase from earlier: 0 points 6 CIT Score: 0 points  Advance Directives (For Healthcare) Does Patient Have a Medical Advance Directive?: Yes Does patient want to make changes to medical advance directive?: No - Patient declined Type of Advance Directive: Healthcare Power of [place]; Living will Copy of Healthcare  Power of Attorney in Chart?: No - copy requested Copy of Living Will in Chart?: No -  copy requested Would patient like information on creating a medical advance directive?: No - Patient declined  Reviewed/Updated  Reviewed/Updated: Reviewed All (Medical, Surgical, Family, Medications, Allergies, Care Teams, Patient Goals)    Allergies (verified) Allopurinol , Fluoxetine , Mucinex  [guaifenesin  er], Penicillins, Pregabalin , Amitriptyline , Bupropion , Escitalopram oxalate, Ozempic  (0.25 or 0.5 mg-dose) [semaglutide (0.25 or 0.5mg -dos)], Prozac  [fluoxetine  hcl], Tirzepatide , and Rosuvastatin    Current Medications (verified) Outpatient Encounter Medications as of 05/07/2024  Medication Sig   busPIRone  (BUSPAR ) 5 MG tablet Take 5 mg by mouth in the morning and at bedtime.   busPIRone  (BUSPAR ) 7.5 MG tablet Take 1 tablet (7.5 mg total) by mouth 2 (two) times daily.   Calcium  Carbonate-Vit D-Min (CALCIUM  600 + MINERALS) 600-200 MG-UNIT TABS Take 600 mg by mouth daily.   cholecalciferol  (VITAMIN D3) 25 MCG (1000 UNIT) tablet Take 1,000 Units by mouth daily. (Patient taking differently: Take 2,000 Units by mouth daily.)   Cyanocobalamin  (B-12) 1000 MCG CAPS Take 1 capsule by mouth daily at 6 (six) AM.   empagliflozin  (JARDIANCE ) 10 MG TABS tablet Take 10 mg by mouth daily.   Evolocumab  (REPATHA  SURECLICK) 140 MG/ML SOAJ Inject 140 mg into the skin every 14 (fourteen) days.   famotidine  (PEPCID ) 20 MG tablet Take 2 tablets (40 mg total) by mouth daily.   furosemide  (LASIX ) 40 MG tablet Take 1 tablet (40 mg total) by mouth daily.   hydrochlorothiazide  (MICROZIDE ) 12.5 MG capsule TAKE 1 CAPSULE BY MOUTH DAILY   Multiple Vitamins-Minerals (CENTRUM SILVER ULTRA WOMENS) TABS Take 1 tablet by mouth daily.   nitroGLYCERIN  (NITROSTAT ) 0.4 MG SL tablet Place 1 tablet (0.4 mg total) under the tongue every 5 (five) minutes as needed for chest pain.   Omega 3-6-9 Fatty Acids (OMEGA 3-6-9 PO) Take 300 mg by mouth  daily. Omega XL   OVER THE COUNTER MEDICATION Take 2 tablets by mouth daily as needed (Tylenol  250mg /Ibuprofen  125mg  each).   peppermint oil liquid Apply topically as needed. Peppermint, wintergreen, black seed, basil and frankincense   telmisartan  (MICARDIS ) 20 MG tablet TAKE 1 TABLET BY MOUTH DAILY   [DISCONTINUED] acetaminophen  (TYLENOL ) 650 MG CR tablet Take 1,300 mg by mouth every 8 (eight) hours as needed for pain.   Iron -Vitamin C (VITRON-C) 65-125 MG TABS Take 1 tablet by mouth daily. (Patient not taking: Reported on 05/07/2024)   No facility-administered encounter medications on file as of 05/07/2024.    History: Past Medical History:  Diagnosis Date   Allergic rhinitis 07/05/2007   Qualifier: Diagnosis of   By: Lorane Browning         Allergic urticaria 03/27/2016   Allergy    Anemia 07/17/2016   Anxiety    Arthralgia 12/03/2016   Asthma    seasonal   Asthma with acute exacerbation 07/14/2014   ATTENTION DEFICIT DISORDER, INATTENTIVE TYPE 09/22/2009   Qualifier: Diagnosis of   By: Mahlon MD, Comer      Replacing diagnoses that were inactivated after the 08/28/22 regulatory import     Baker's cyst of knee, right 02/26/2023   Blood transfusion without reported diagnosis    Breast cancer (HCC)    right   Colon polyps    CPAP (continuous positive airway pressure) dependence    DDD (degenerative disc disease), cervical 05/04/2021   Depression    Depression with anxiety 06/25/2006   Qualifier: Diagnosis of   By: Nicholaus Channel         Dermographia 03/27/2016   Diverticulitis of colon without hemorrhage 09/21/2014   Ear canal dryness  08/03/2011   Edema    Educated about COVID-19 virus infection 04/19/2020   Elevated sed rate 04/03/2022   Essential hypertension 06/25/2006   Qualifier: Diagnosis of   By: Nicholaus Channel         Family history of breast cancer in first degree relative    Fatigue 08/03/2011   Fibromyalgia    Gastroesophageal reflux disease without  esophagitis 06/25/2006   Centricity Description: GERD  Qualifier: Diagnosis of   By: Nicholaus Channel      Centricity Description: ESOPHAGEAL REFLUX  Qualifier: Diagnosis of   By: Tish MD, Elsie Overall testing 05/15/2016   Negative for mutations within any of 32 genes on the Custom Cancer Panel through [facility].  No variants of uncertain significance (VUSes were found).  This Custom Panel offered by [facility] Evert, MD) includes sequencing and/or deletion duplication testing of the following 34 genes: APC, ATM, AXIN2, BARD1, BMPR1A, BRCA1, BRCA2, BRIP1, CDH1, CDK4, CDKN2A, CHEK2, EPCAM, F   GERD (gastroesophageal reflux disease)    Gout of big toe 03/10/2013   Allergic to Allopurinol       History of breast cancer 03/27/2016   History of colonic polyps 08/14/2011   03/2006 - diminutive adenoma removed Ollen)  08/14/2011 - diminutive cecal adenoma removed  IMO SNOMED Dx Update Oct 2024     Hyperglycemia 03/05/2017   Hyperlipidemia 08/21/2016   no meds   Hypertension    controlled by medications   Insomnia 02/15/2009   Qualifier: Diagnosis of   By: Mahlon MD, Comer         Joint pain    Left arm pain 01/21/2021   Low back pain 01/11/2022   Memory changes 04/19/2020   Morbid obesity (HCC) 09/21/2010   Neck pain 05/02/2021   Neuromuscular disorder (HCC)    Fibromyalgia   Obesity    OSA (obstructive sleep apnea) 04/23/2012   12/13 Mod- AHI 15/h, corrected by CPAP 8 cm     Osteoarthritis    RA   Other peripheral vertigo, unspecified ear 10/27/2019   PAC (premature atrial contraction) 09/21/2010   Palpitations 09/21/2010   Peripheral neuropathy 04/19/2020   Gabapentin  caused edema     PONV (postoperative nausea and vomiting)    occasional   Pre-diabetes    Preventative health care 10/08/2012   Rheumatoid arthritis (HCC) 06/24/2015   History of diagnosis of RA is questionable. Not highly symptomatic at this time     Right hand pain  11/13/2022   Right knee pain 12/06/2016   RLS (restless legs syndrome) 06/07/2017   Spondylolisthesis of lumbar region 09/21/2021   TMJ arthralgia 04/19/2020   Type 2 diabetes mellitus with obesity 12/10/2011   dx'd 11/2011     Urinary incontinence 10/27/2019   Vertigo 03/14/2018   Viral meningitis    Vitamin D  deficiency 03/11/2008   Qualifier: Diagnosis of   By: Mahlon MD, Comer         Past Surgical History:  Procedure Laterality Date   ABDOMINAL HYSTERECTOMY  1998   ADENOIDECTOMY     BACK SURGERY     09/21/21   BREAST EXCISIONAL BIOPSY Left 1993   benign   BREAST SURGERY     Tran flap due to breast cancer   CESAREAN SECTION  1988   COLONOSCOPY  08/14/2011   HAND SURGERY     dog bite, right hand   HAND SURGERY     trauma, left hand   KNEE ARTHROSCOPY  bilateral   left knee replacement  2010   LUMBAR WOUND DEBRIDEMENT N/A 10/07/2021   Procedure: LUMBAR WOUND DEBRIDEMENT/REVISION;  Surgeon: Gillie Duncans, MD;  Location: [facility] OR;  Service: Neurosurgery;  Laterality: N/A;  3C/RM 21 to follow Dr Lanis   MASTECTOMY  01/13/1994   Right breast   parathyroid  resection     PARATHYROIDECTOMY     POLYPECTOMY     rectal abscess     SEPTOPLASTY  1980   SHOULDER ARTHROSCOPY Right 11/09/2015   Procedure: ARTHROSCOPY SHOULDER-acromioplasty, distal clavicle resection and debridement;  Surgeon: Maude Herald, MD;  Location[facility];  Service: Orthopedics;  Laterality: Right;   shoulder arthroscopy     rotator cuff repair   TOE SURGERY Right    paronychia and adenoma removed   TONSILLECTOMY     TOTAL KNEE ARTHROPLASTY  06/18/2008   Daldorf   TUMOR REMOVAL  1982   , scalp   Family History  Problem Relation Age of Onset   Emphysema Father    Lung cancer Father        lung ca dx 28   Other Father        prostate issues   Diabetes Father    Breast cancer Maternal Aunt        dx late 73s   Colon cancer Maternal Aunt        dx 63s   Colon polyps Maternal Aunt     Prostate cancer Maternal Grandfather 33       d. 85y   Heart disease Paternal Grandfather    Heart attack Paternal Grandfather        d. 50y   Diabetes Paternal Grandfather    Asthma Daughter        seasonal   Arthritis Maternal Grandmother    Aneurysm Maternal Grandmother        d. brain aneurysm at 49   Arthritis Mother    Colon polyps Mother    Hypertension Mother    Sleep apnea Mother    Multiple sclerosis Sister    Breast cancer Sister 19       L IDC and DCIS; ER/PR+, Her2-   Fibrocystic breast disease Sister    Arthritis/Rheumatoid Sister    Breast cancer Sister    Cancer Maternal Uncle        d. mouth cancer at younger age; smoker   Other Paternal Uncle        muscle issues - couldn't walk or talk; d. 20y   Cancer Maternal Aunt        lymphoma, dx 66s   Ovarian cancer Maternal Aunt        dx 9s; d. late 70s   Lung cancer Maternal Uncle        d. 60y; former smoker   Throat cancer Cousin        maternal 1st cousin; smoker   Breast cancer Cousin 50       maternal 1st cousin   Other Paternal Uncle        prostate issues   Breast cancer Cousin        paternal 1st cousin dx late 55s   Throat cancer Cousin        maternal 1st cousin; used SL tobacco   Prostate cancer Cousin        maternal 1st cousin dx 40s   Esophageal cancer Neg Hx    Rectal cancer Neg Hx    Stomach cancer Neg Hx    Social  History   Occupational History   Occupation: ACCOUNTING    Employer: [employer]  Tobacco Use   Smoking status: Never   Smokeless tobacco: Never  Vaping Use   Vaping status: Never Used  Substance and Sexual Activity   Alcohol use: Not Currently   Drug use: No   Sexual activity: Not Currently   Tobacco Counseling Counseling given: Not Answered  SDOH Screenings   Food Insecurity: No Food Insecurity (05/07/2024)  Housing: Low Risk  (05/07/2024)  Transportation Needs: No Transportation Needs (05/07/2024)  Utilities: Not At Risk (05/07/2024)  Alcohol  Screen: Low Risk  (05/02/2023)  Depression (PHQ2-9): Medium Risk (05/07/2024)  Financial Resource Strain: Low Risk  (05/07/2024)  Physical Activity: Inactive (05/07/2024)  Social Connections: Moderately Integrated (05/07/2024)  Stress: No Stress Concern Present (05/07/2024)  Tobacco Use: Low Risk  (05/07/2024)  Health Literacy: Adequate Health Literacy (05/02/2023)   See flowsheets for full screening details  Depression Screen PHQ 2 & 9 Depression Scale- Over the past 2 weeks, how often have you been bothered by any of the following problems? Little interest or pleasure in doing things: 1 Feeling down, depressed, or hopeless (PHQ Adolescent also includes...irritable): 1 PHQ-2 Total Score: 2 Trouble falling or staying asleep, or sleeping too much: 1 (Wakes up at 3 or 4am. Taking medication) Feeling tired or having little energy: 1 (still does what needs to be done) Poor appetite or overeating (PHQ Adolescent also includes...weight loss): 0 Feeling bad about yourself - or that you are a failure or have let yourself or your family down: 1 (wants to lose weight, has bad knee and is difficult to walk) Trouble concentrating on things, such as reading the newspaper or watching television (PHQ Adolescent also includes...like school work): 0 Moving or speaking so slowly that other people could have noticed. Or the opposite - being so fidgety or restless that you have been moving around a lot more than usual: 0 Thoughts that you would be better off dead, or of hurting yourself in some way: 0 PHQ-9 Total Score: 5 If you checked off any problems, how difficult have these problems made it for you to do your work, take care of things at home, or get along with other people?: Not difficult at all     Goals Addressed   None          Objective:    Today's Vitals   05/07/24 0824  Weight: 279 lb (126.6 kg)  Height: 5' 6 (1.676 m)   Body mass index is 45.03 kg/m.  Hearing/Vision screen No  results found. Immunizations and Health Maintenance Health Maintenance  Topic Date Due   Influenza Vaccine  12/28/2023   Pneumococcal Vaccine: 50+ Years (2 of 2 - PCV) 08/08/2024 (Originally 04/19/2021)   HEMOGLOBIN A1C  06/14/2024   OPHTHALMOLOGY EXAM  07/08/2024   FOOT EXAM  08/08/2024   Mammogram  11/12/2024   Diabetic kidney evaluation - eGFR measurement  12/12/2024   Diabetic kidney evaluation - Urine ACR  12/12/2024   Medicare Annual Wellness (AWV)  05/07/2025   Colonoscopy  10/08/2028   DTaP/Tdap/Td (3 - Td or Tdap) 04/19/2030   Bone Density Scan  Completed   Hepatitis C Screening  Completed   Zoster Vaccines- Shingrix  Completed   Meningococcal B Vaccine  Aged Out   COVID-19 Vaccine  Discontinued        Assessment/Plan:  This is a routine wellness examination for [place].  Patient Care Team: Domenica Harlene LABOR, MD as PCP -  General (Family Medicine) Court Dorn PARAS, MD as PCP - Cardiology (Cardiology) Latisha Medford, MD as Consulting Physician (Obstetrics and Gynecology) [facility] (Ophthalmology) Goodrich, Callie E, PA-C as Physician Assistant (Cardiology)  I have personally reviewed and noted the following in the patients chart:   Medical and social history Use of alcohol, tobacco or illicit drugs  Current medications and supplements including opioid prescriptions. Functional ability and status Nutritional status Physical activity Advanced directives List of other physicians Hospitalizations, surgeries, and ER visits in previous 12 months Vitals Screenings to include cognitive, depression, and falls Referrals and appointments  No orders of the defined types were placed in this encounter.  In addition, I have reviewed and discussed with patient certain preventive protocols, quality metrics, and best practice recommendations. A written personalized care plan for preventive services as well as general preventive health recommendations were provided to  patient.   Lolita Libra, CMA   05/07/2024   Return in 1 year (on 05/07/2025).  After Visit Summary: (MyChart) Due to this being a telephonic visit, the after visit summary with patients personalized plan was offered to patient via MyChart   Nurse Notes: nothing significant to report

## 2024-05-07 NOTE — Assessment & Plan Note (Signed)
 [id number] acceptable, minimize simple carbs. Increase exercise as tolerated. Continue current meds

## 2024-05-07 NOTE — Assessment & Plan Note (Signed)
 Well controlled, no changes to meds. Encouraged heart healthy diet such as the DASH diet and exercise as tolerated.

## 2024-05-07 NOTE — Progress Notes (Addendum)
 Subjective:     Patient ID: [name], female    DOB: 04-Oct-1955, 68 y.o.   MRN: [id number]  No chief complaint on file.   HPI  Discussed the use of AI scribe software for clinical note transcription with the patient, who gave verbal consent to proceed.   History of Present Illness [name] is a 68 year old female who presents with a chronic wound infection.  She has a chronic back wound related to surgery 1.5 years ago, with intermittent drainage and infection. At the wound care center, the wound was opened slightly and pus was expressed.  She completed an unknown antibiotic that temporarily resolved the infection, but it recurred three weeks later and cultured as pseudomonas. The wound has tunneling and is being treated with a wound vacuum, which she finds cumbersome and noisy.  She has a cracked tooth scheduled for extraction next Wednesday. She has a knee replacement and usually receives antibiotics before dental work. She is allergic to penicillin and is unsure which antibiotic to use. She has Bactrim  at home that she was told is not appropriate for the current infection.  She uses CPAP at night. Using CPAP and the wound vac together makes it hard for her to move and sleep comfortably. She is frustrated with the persistent wound and its impact on her quality of life. No recent gout flares.  HTN- Telmesartan 20 mg daily, furosemide  40 mg daily, HCTZ 12.5 mg daily  Diabetes: - Checking glucose at home: No - Medications: Jardiance  10 mg daily - Compliant with medications - Denies symptoms of hypoglycemia, polyuria, polydipsia, numbness extremities, foot ulcers/trauma, visual changes, wounds that are not healing, medication side effects  BP at home:  Depression anxiety BuSpar  7.5 mg twice daily,  HLD-Repatha  injections q 14 days  GERD-famotidine  40 mg daily  Patient denies fever, chills, SOB, CP, palpitations, dyspnea, edema, HA, vision changes,  N/V/D, abdominal pain, urinary symptoms, rash, weight changes, and recent illness or hospitalizations.       History of Present Illness              There are no preventive care reminders to display for this patient.   Past Medical History:  Diagnosis Date   Allergic rhinitis 07/05/2007   Qualifier: Diagnosis of   By: Lorane Browning         Allergic urticaria 03/27/2016   Allergy    Anemia 07/17/2016   Anxiety    Arthralgia 12/03/2016   Asthma    seasonal   Asthma with acute exacerbation 07/14/2014   ATTENTION DEFICIT DISORDER, INATTENTIVE TYPE 09/22/2009   Qualifier: Diagnosis of   By: Mahlon MD, Comer      Replacing diagnoses that were inactivated after the 08/28/22 regulatory import     Baker's cyst of knee, right 02/26/2023   Blood transfusion without reported diagnosis    Breast cancer (HCC)    right   Colon polyps    CPAP (continuous positive airway pressure) dependence    DDD (degenerative disc disease), cervical 05/04/2021   Depression    Depression with anxiety 06/25/2006   Qualifier: Diagnosis of   By: Nicholaus Channel         Dermographia 03/27/2016   Diverticulitis of colon without hemorrhage 09/21/2014   Ear canal dryness 08/03/2011   Edema    Educated about COVID-19 virus infection 04/19/2020   Elevated sed rate 04/03/2022   Essential hypertension 06/25/2006   Qualifier: Diagnosis of   By: Nicholaus Channel  Family history of breast cancer in first degree relative    Fatigue 08/03/2011   Fibromyalgia    Gastroesophageal reflux disease without esophagitis 06/25/2006   Centricity Description: GERD  Qualifier: Diagnosis of   By: Nicholaus Channel      Centricity Description: ESOPHAGEAL REFLUX  Qualifier: Diagnosis of   By: Tish MD, Elsie Overall testing 05/15/2016   Negative for mutations within any of 32 genes on the Custom Cancer Panel through [employer].  No variants of uncertain significance (VUSes were found).  This Custom  Panel offered by [facility] Evert, MD) includes sequencing and/or deletion duplication testing of the following 34 genes: APC, ATM, AXIN2, BARD1, BMPR1A, BRCA1, BRCA2, BRIP1, CDH1, CDK4, CDKN2A, CHEK2, EPCAM, F   GERD (gastroesophageal reflux disease)    Gout of big toe 03/10/2013   Allergic to Allopurinol       History of breast cancer 03/27/2016   History of colonic polyps 08/14/2011   03/2006 - diminutive adenoma removed Ollen)  08/14/2011 - diminutive cecal adenoma removed  IMO SNOMED Dx Update Oct 2024     Hyperglycemia 03/05/2017   Hyperlipidemia 08/21/2016   no meds   Hypertension    controlled by medications   Insomnia 02/15/2009   Qualifier: Diagnosis of   By: Mahlon MD, Comer         Joint pain    Left arm pain 01/21/2021   Low back pain 01/11/2022   Memory changes 04/19/2020   Morbid obesity (HCC) 09/21/2010   Neck pain 05/02/2021   Neuromuscular disorder (HCC)    Fibromyalgia   Obesity    OSA (obstructive sleep apnea) 04/23/2012   12/13 Mod- AHI 15/h, corrected by CPAP 8 cm     Osteoarthritis    RA   Other peripheral vertigo, unspecified ear 10/27/2019   PAC (premature atrial contraction) 09/21/2010   Palpitations 09/21/2010   Peripheral neuropathy 04/19/2020   Gabapentin  caused edema     PONV (postoperative nausea and vomiting)    occasional   Pre-diabetes    Preventative health care 10/08/2012   Rheumatoid arthritis (HCC) 06/24/2015   History of diagnosis of RA is questionable. Not highly symptomatic at this time     Right hand pain 11/13/2022   Right knee pain 12/06/2016   RLS (restless legs syndrome) 06/07/2017   Spondylolisthesis of lumbar region 09/21/2021   TMJ arthralgia 04/19/2020   Type 2 diabetes mellitus with obesity 12/10/2011   dx'd 11/2011     Urinary incontinence 10/27/2019   Vertigo 03/14/2018   Viral meningitis    Vitamin D  deficiency 03/11/2008   Qualifier: Diagnosis of   By: Mahlon MD, Comer          Past  Surgical History:  Procedure Laterality Date   ABDOMINAL HYSTERECTOMY  1998   ADENOIDECTOMY     BACK SURGERY     09/21/21   BREAST EXCISIONAL BIOPSY Left 1993   benign   BREAST SURGERY     Tran flap due to breast cancer   CESAREAN SECTION  1988   COLONOSCOPY  08/14/2011   HAND SURGERY     dog bite, right hand   HAND SURGERY     trauma, left hand   KNEE ARTHROSCOPY     bilateral   left knee replacement  2010   LUMBAR WOUND DEBRIDEMENT N/A 10/07/2021   Procedure: LUMBAR WOUND DEBRIDEMENT/REVISION;  Surgeon: Gillie Duncans, MD;  Location: [facility] OR;  Service: Neurosurgery;  Laterality: N/A;  3C/RM 21 to follow Dr Lanis   MASTECTOMY  01/13/1994   Right breast   parathyroid  resection     PARATHYROIDECTOMY     POLYPECTOMY     rectal abscess     SEPTOPLASTY  1980   SHOULDER ARTHROSCOPY Right 11/09/2015   Procedure: ARTHROSCOPY SHOULDER-acromioplasty, distal clavicle resection and debridement;  Surgeon: Maude Herald, MD;  Location: [facility] OR;  Service: Orthopedics;  Laterality: Right;   shoulder arthroscopy     rotator cuff repair   TOE SURGERY Right    paronychia and adenoma removed   TONSILLECTOMY     TOTAL KNEE ARTHROPLASTY  06/18/2008   Daldorf   TUMOR REMOVAL  1982   , scalp    Family History  Problem Relation Age of Onset   Emphysema Father    Lung cancer Father        lung ca dx 66   Other Father        prostate issues   Diabetes Father    Breast cancer Maternal Aunt        dx late 67s   Colon cancer Maternal Aunt        dx 78s   Colon polyps Maternal Aunt    Prostate cancer Maternal Grandfather 84       d. 85y   Heart disease Paternal Grandfather    Heart attack Paternal Grandfather        d. 50y   Diabetes Paternal Grandfather    Asthma Daughter        seasonal   Arthritis Maternal Grandmother    Aneurysm Maternal Grandmother        d. brain aneurysm at 44   Arthritis Mother    Colon polyps Mother    Hypertension Mother    Sleep apnea Mother     Multiple sclerosis Sister    Breast cancer Sister 28       L IDC and DCIS; ER/PR+, Her2-   Fibrocystic breast disease Sister    Arthritis/Rheumatoid Sister    Breast cancer Sister    Cancer Maternal Uncle        d. mouth cancer at younger age; smoker   Other Paternal Uncle        muscle issues - couldn't walk or talk; d. 20y   Cancer Maternal Aunt        lymphoma, dx 52s   Ovarian cancer Maternal Aunt        dx 24s; d. late 35s   Lung cancer Maternal Uncle        d. 56y; former smoker   Throat cancer Cousin        maternal 1st cousin; smoker   Breast cancer Cousin 60       maternal 1st cousin   Other Paternal Uncle        prostate issues   Breast cancer Cousin        paternal 1st cousin dx late 36s   Throat cancer Cousin        maternal 1st cousin; used SL tobacco   Prostate cancer Cousin        maternal 1st cousin dx 39s   Esophageal cancer Neg Hx    Rectal cancer Neg Hx    Stomach cancer Neg Hx     Social History   Socioeconomic History   Marital status: Widowed    Spouse name: Not on file   Number of children: 1   Years of education: Not on file   Highest  education level: 12th grade  Occupational History   Occupation: ACCOUNTING    Employer: [employer]  Tobacco Use   Smoking status: Never   Smokeless tobacco: Never  Vaping Use   Vaping status: Never Used  Substance and Sexual Activity   Alcohol use: Not Currently   Drug use: No   Sexual activity: Not Currently  Other Topics Concern   Not on file  Social History Narrative   Not on file   Social Drivers of Health   Tobacco Use: Low Risk (05/09/2024)   Patient History    Smoking Tobacco Use: Never    Smokeless Tobacco Use: Never    Passive Exposure: Not on file  Financial Resource Strain: Low Risk (05/07/2024)   Overall Financial Resource Strain (CARDIA)    Difficulty of Paying Living Expenses: Not hard at all  Food Insecurity: No Food Insecurity (05/07/2024)   Epic    Worried About Community Education Officer in the Last Year: Never true    Ran Out of Food in the Last Year: Never true  Transportation Needs: No Transportation Needs (05/07/2024)   Epic    Lack of Transportation (Medical): No    Lack of Transportation (Non-Medical): No  Physical Activity: Inactive (05/07/2024)   Exercise Vital Sign    Days of Exercise per Week: 0 days    Minutes of Exercise per Session: Not on file  Stress: No Stress Concern Present (05/07/2024)   [employer] of Occupational Health - Occupational Stress Questionnaire    Feeling of Stress: Only a little  Social Connections: Moderately Integrated (05/07/2024)   Social Connection and Isolation Panel    Frequency of Communication with Friends and Family: More than three times a week    Frequency of Social Gatherings with Friends and Family: Twice a week    Attends Religious Services: More than 4 times per year    Active Member of [employer] or Organizations: Yes    Attends Banker Meetings: More than 4 times per year    Marital Status: Widowed  Intimate Partner Violence: Not At Risk (05/07/2024)   Epic    Fear of Current or Ex-Partner: No    Emotionally Abused: No    Physically Abused: No    Sexually Abused: No  Depression (PHQ2-9): Low Risk (05/09/2024)   Depression (PHQ2-9)    PHQ-2 Score: 1  Recent Concern: Depression (PHQ2-9) - Medium Risk (05/07/2024)   Depression (PHQ2-9)    PHQ-2 Score: 5  Alcohol Screen: Low Risk (05/02/2023)   Alcohol Screen    Last Alcohol Screening Score (AUDIT): 0  Housing: Low Risk (05/07/2024)   Epic    Unable to Pay for Housing in the Last Year: No    Number of Times Moved in the Last Year: 0    Homeless in the Last Year: No  Utilities: Not At Risk (05/07/2024)   Epic    Threatened with loss of utilities: No  Health Literacy: Adequate Health Literacy (05/02/2023)   B1300 Health Literacy    Frequency of need for help with medical instructions: Never    Outpatient Medications Prior to Visit   Medication Sig Dispense Refill   busPIRone  (BUSPAR ) 5 MG tablet Take 5 mg by mouth in the morning and at bedtime.     busPIRone  (BUSPAR ) 7.5 MG tablet Take 1 tablet (7.5 mg total) by mouth 2 (two) times daily. 180 tablet 1   Calcium  Carbonate-Vit D-Min (CALCIUM  600 + MINERALS) 600-200 MG-UNIT TABS Take 600 mg  by mouth daily.     cholecalciferol  (VITAMIN D3) 25 MCG (1000 UNIT) tablet Take 1,000 Units by mouth daily. (Patient taking differently: Take 2,000 Units by mouth daily.)     Cyanocobalamin  (B-12) 1000 MCG CAPS Take 1 capsule by mouth daily at 6 (six) AM.     Evolocumab  (REPATHA  SURECLICK) 140 MG/ML SOAJ Inject 140 mg into the skin every 14 (fourteen) days. 6 mL 3   famotidine  (PEPCID ) 20 MG tablet Take 2 tablets (40 mg total) by mouth daily.     furosemide  (LASIX ) 40 MG tablet Take 1 tablet (40 mg total) by mouth daily.     hydrochlorothiazide  (MICROZIDE ) 12.5 MG capsule TAKE 1 CAPSULE BY MOUTH DAILY 100 capsule 2   Multiple Vitamins-Minerals (CENTRUM SILVER ULTRA WOMENS) TABS Take 1 tablet by mouth daily.     nitroGLYCERIN  (NITROSTAT ) 0.4 MG SL tablet Place 1 tablet (0.4 mg total) under the tongue every 5 (five) minutes as needed for chest pain. 25 tablet 1   Omega 3-6-9 Fatty Acids (OMEGA 3-6-9 PO) Take 300 mg by mouth daily. Omega XL     OVER THE COUNTER MEDICATION Take 2 tablets by mouth daily as needed (Tylenol  250mg /Ibuprofen  125mg  each).     peppermint oil liquid Apply topically as needed. Peppermint, wintergreen, black seed, basil and frankincense     telmisartan  (MICARDIS ) 20 MG tablet TAKE 1 TABLET BY MOUTH DAILY 100 tablet 2   empagliflozin  (JARDIANCE ) 10 MG TABS tablet Take 10 mg by mouth daily.     Iron -Vitamin C (VITRON-C) 65-125 MG TABS Take 1 tablet by mouth daily. (Patient not taking: Reported on 05/07/2024)     No facility-administered medications prior to visit.    Allergies  Allergen Reactions   Allopurinol  Hives   Fluoxetine  Hives   Mucinex  [Guaifenesin  Er]  Shortness Of Breath   Penicillins Hives, Other (See Comments) and Itching    Has patient had a PCN reaction causing immediate rash, facial/tongue/throat swelling, SOB or lightheadedness with hypotension: no  Has patient had a PCN reaction causing severe rash involving mucus membranes or skin necrosis: no  Has patient had a PCN reaction that required hospitalization no  Has patient had a PCN reaction occurring within the last 10 years: no  If all of the above answers are NO, then may proceed with Cephalosporin use.  Product containing penicillin (product)   Pregabalin  Swelling   Amitriptyline  Other (See Comments)    felt weird, fatigue, dizziness  amitriptyline    Bupropion  Other (See Comments)    Uncontrolled movements of tongue, hand   Escitalopram Oxalate Other (See Comments)    Nausea and hypersalivation.  escitalopram oxalate   Ozempic  (0.25 Or 0.5 Mg-Dose) [Semaglutide (0.25 Or 0.5mg -Dos)] Other (See Comments)    Abdominal pain   Prozac  [Fluoxetine  Hcl] Hives   Tirzepatide  Other (See Comments)   Rosuvastatin      ROS     Objective:    Physical Exam Vitals reviewed.  Constitutional:      General: She is not in acute distress.    Appearance: She is obese. She is not toxic-appearing.  HENT:     Head: Normocephalic and atraumatic.     Mouth/Throat:     Mouth: Mucous membranes are moist.     Pharynx: Oropharynx is clear.  Eyes:     Extraocular Movements: Extraocular movements intact.     Pupils: Pupils are equal, round, and reactive to light.  Cardiovascular:     Rate and Rhythm: Normal rate and regular rhythm.  Pulses: Normal pulses.     Heart sounds: Normal heart sounds. No murmur heard. Pulmonary:     Effort: Pulmonary effort is normal. No respiratory distress.     Breath sounds: Normal breath sounds. No wheezing.  Musculoskeletal:        General: No swelling.     Cervical back: Neck supple.     Comments: Wound vac  Skin:    General: Skin is warm  and dry.  Neurological:     General: No focal deficit present.     Mental Status: She is alert and oriented to person, place, and time.  Psychiatric:        Mood and Affect: Mood normal.        Behavior: Behavior normal.        Thought Content: Thought content normal.        Judgment: Judgment normal.      BP 122/72   Pulse 80   Ht 5' 6 (1.676 m)   Wt 286 lb (129.7 kg)   SpO2 99%   BMI 46.16 kg/m  Wt Readings from Last 3 Encounters:  05/09/24 286 lb (129.7 kg)  05/07/24 279 lb (126.6 kg)  03/13/24 282 lb 4 oz (128 kg)       Assessment & Plan:   Problem List Items Addressed This Visit       Cardiovascular and Mediastinum   Essential hypertension   Well controlled, no changes to meds. Encouraged heart healthy diet such as the DASH diet and exercise as tolerated.          Respiratory   OSA (obstructive sleep apnea)   Using CPAP. Follows with Pulmonology.        Endocrine   Type 2 diabetes mellitus in patient with obesity (HCC)     Other   Depression with anxiety   Stable on current medications, denies SI/HI.      Hyperlipidemia LDL goal <70   LDL goal >70.  Managed with Repatha  Encourage heart healthy diet such as MIND or DASH diet, increase exercise, avoid trans fats, simple carbohydrates and processed foods, consider a krill or fish or flaxseed oil cap daily.        Obesity   Her BMI today is 46. Encouraged ongoing weight-loss efforts, as even modest reductions can significantly improve overall health. Encouraged DASH or MIND diet, decrease po intake and increase exercise as tolerated. Needs 7-8 hours of sleep nightly. Avoid trans fats, eat small, frequent meals every 4-5 hours with lean proteins, complex carbs and healthy fats. Minimize simple carbs, high fat foods and processed foods       S/P lumbar fusion - Primary   Chronic post-lumbar fusion wound with infection. Managed with wound vac. Wound care center overseeing management. - Continue wound vac  management per wound care center. - Ensure follow-up with wound care center.      Vitamin D  deficiency   hgba1c acceptable, minimize simple carbs. Increase exercise as tolerated. Continue current meds       General health maintenance Routine health maintenance discussed. Recent labs satisfactory. - Schedule follow-up in six months for physical exam.    I have discontinued [name]'s Iron -Vitamin C. I am also having her maintain her cholecalciferol , B-12, telmisartan , hydrochlorothiazide , Centrum Silver Ultra Womens, Omega 3-6-9 Fatty Acids (OMEGA 3-6-9 PO), Calcium  600 + Minerals, peppermint oil, famotidine , nitroGLYCERIN , Repatha  SureClick, furosemide , busPIRone , OVER THE COUNTER MEDICATION, and busPIRone .  No orders of the defined types were placed in this encounter.

## 2024-05-07 NOTE — Patient Instructions (Addendum)
 Ms. Kist,  Thank you for taking the time for your Medicare Wellness Visit. I appreciate your continued commitment to your health goals. Please review the care plan we discussed, and feel free to reach out if I can assist you further.  Please note that Annual Wellness Visits do not include a physical exam. Some assessments may be limited, especially if the visit was conducted virtually. If needed, we may recommend an in-person follow-up with your provider.  Ongoing Care Seeing your primary care provider every 3 to 6 months helps us  monitor your health and provide consistent, personalized care.   Harlene Jolly, NP: 05/09/24 8:20am Medicare AWV: 05/08/25 8:20am, telephone  Referrals If a referral was made during today's visit and you haven't received any updates within two weeks, please contact the referred provider directly to check on the status.  Diabetic Eye Exam:  please call to schedule your next exam after 07/08/24  Bone Density: please call [facility] to schedule after 09/19/24.   Recommended Screenings:  Health Maintenance  Topic Date Due   Flu Shot  12/28/2023   Medicare Annual Wellness Visit  05/01/2024   Pneumococcal Vaccine for age over 27 (2 of 2 - PCV) 08/08/2024*   Hemoglobin A1C  06/14/2024   Eye exam for diabetics  07/08/2024   Complete foot exam   08/08/2024   Breast Cancer Screening  11/12/2024   Yearly kidney function blood test for diabetes  12/12/2024   Yearly kidney health urinalysis for diabetes  12/12/2024   Colon Cancer Screening  10/08/2028   DTaP/Tdap/Td vaccine (3 - Td or Tdap) 04/19/2030   Osteoporosis screening with Bone Density Scan  Completed   Hepatitis C Screening  Completed   Zoster (Shingles) Vaccine  Completed   Meningitis B Vaccine  Aged Out   COVID-19 Vaccine  Discontinued  *Topic was postponed. The date shown is not the original due date.       05/07/2024    5:07 AM  Advanced Directives  Does Patient Have a Medical  Advance Directive? Yes  Type of Estate Agent of [place];Living will  Does patient want to make changes to medical advance directive? No - Patient declined  Copy of Healthcare Power of Attorney in Chart? No - copy requested  Would patient like information on creating a medical advance directive? No - Patient declined  Please bring a copy of your health care power of attorney and living will to the office to be added to your chart at your convenience. You can mail a copy to [facility] [address] [place], [place] [address] or email to [email or web address]   Vision: Annual vision screenings are recommended for early detection of glaucoma, cataracts, and diabetic retinopathy. These exams can also reveal signs of chronic conditions such as diabetes and high blood pressure.  Dental: Annual dental screenings help detect early signs of oral cancer, gum disease, and other conditions linked to overall health, including heart disease and diabetes.  Please see the attached documents for additional preventive care recommendations.

## 2024-05-07 NOTE — Assessment & Plan Note (Signed)
 Using CPAP. Follows with Pulmonology.

## 2024-05-09 ENCOUNTER — Ambulatory Visit: Admitting: Student

## 2024-05-09 ENCOUNTER — Encounter: Payer: Self-pay | Admitting: Student

## 2024-05-09 VITALS — BP 122/72 | HR 80 | Ht 66.0 in | Wt 286.0 lb

## 2024-05-09 DIAGNOSIS — I10 Essential (primary) hypertension: Secondary | ICD-10-CM

## 2024-05-09 DIAGNOSIS — Z6841 Body Mass Index (BMI) 40.0 and over, adult: Secondary | ICD-10-CM

## 2024-05-09 DIAGNOSIS — F418 Other specified anxiety disorders: Secondary | ICD-10-CM

## 2024-05-09 DIAGNOSIS — E785 Hyperlipidemia, unspecified: Secondary | ICD-10-CM

## 2024-05-09 DIAGNOSIS — R413 Other amnesia: Secondary | ICD-10-CM

## 2024-05-09 DIAGNOSIS — Z981 Arthrodesis status: Secondary | ICD-10-CM | POA: Insufficient documentation

## 2024-05-09 DIAGNOSIS — G4733 Obstructive sleep apnea (adult) (pediatric): Secondary | ICD-10-CM

## 2024-05-09 DIAGNOSIS — E559 Vitamin D deficiency, unspecified: Secondary | ICD-10-CM

## 2024-05-09 DIAGNOSIS — E669 Obesity, unspecified: Secondary | ICD-10-CM

## 2024-05-09 NOTE — Assessment & Plan Note (Signed)
 Her BMI today is 46. Encouraged ongoing weight-loss efforts, as even modest reductions can significantly improve overall health. Encouraged DASH or MIND diet, decrease po intake and increase exercise as tolerated. Needs 7-8 hours of sleep nightly. Avoid trans fats, eat small, frequent meals every 4-5 hours with lean proteins, complex carbs and healthy fats. Minimize simple carbs, high fat foods and processed foods

## 2024-05-09 NOTE — Assessment & Plan Note (Signed)
 Chronic post-lumbar fusion wound with infection. Managed with wound vac. Wound care center overseeing management. - Continue wound vac management per wound care center. - Ensure follow-up with wound care center.

## 2024-05-10 ENCOUNTER — Encounter: Payer: Self-pay | Admitting: Family Medicine

## 2024-05-12 ENCOUNTER — Other Ambulatory Visit: Payer: Self-pay | Admitting: Family Medicine

## 2024-05-13 ENCOUNTER — Other Ambulatory Visit: Payer: Self-pay | Admitting: Family

## 2024-05-13 MED ORDER — BUSPIRONE HCL 15 MG PO TABS: 15.0000 mg | ORAL_TABLET | Freq: Two times a day (BID) | ORAL | 1 refills | Status: AC

## 2024-05-15 ENCOUNTER — Encounter (HOSPITAL_BASED_OUTPATIENT_CLINIC_OR_DEPARTMENT_OTHER): Admitting: General Surgery

## 2024-05-15 DIAGNOSIS — E11622 Type 2 diabetes mellitus with other skin ulcer: Secondary | ICD-10-CM | POA: Diagnosis not present

## 2024-05-15 DIAGNOSIS — I10 Essential (primary) hypertension: Secondary | ICD-10-CM

## 2024-05-26 ENCOUNTER — Telehealth: Payer: Self-pay | Admitting: Pharmacist

## 2024-05-30 ENCOUNTER — Encounter: Payer: Self-pay | Admitting: Family Medicine

## 2024-05-30 DIAGNOSIS — I10 Essential (primary) hypertension: Secondary | ICD-10-CM

## 2024-05-30 DIAGNOSIS — E669 Obesity, unspecified: Secondary | ICD-10-CM

## 2024-05-30 LAB — HEPATIC FUNCTION PANEL
ALT: 21 IU/L (ref 0–32)
AST: 20 IU/L (ref 0–40)
Albumin: 4.2 g/dL (ref 3.9–4.9)
Alkaline Phosphatase: 88 IU/L (ref 49–135)
Bilirubin Total: 0.3 mg/dL (ref 0.0–1.2)
Bilirubin, Direct: 0.11 mg/dL (ref 0.00–0.40)
Total Protein: 6.9 g/dL (ref 6.0–8.5)

## 2024-05-30 LAB — LIPID PANEL
Chol/HDL Ratio: 1.7 ratio (ref 0.0–4.4)
Cholesterol, Total: 86 mg/dL — ABNORMAL LOW (ref 100–199)
HDL: 52 mg/dL
LDL Chol Calc (NIH): 14 mg/dL (ref 0–99)
Triglycerides: 112 mg/dL (ref 0–149)
VLDL Cholesterol Cal: 20 mg/dL (ref 5–40)

## 2024-05-30 MED ORDER — JARDIANCE 10 MG PO TABS: 10.0000 mg | ORAL_TABLET | Freq: Every day | ORAL | 2 refills | Status: AC

## 2024-05-30 MED ORDER — NITROGLYCERIN 0.4 MG SL SUBL: 0.4000 mg | SUBLINGUAL_TABLET | SUBLINGUAL | 1 refills | Status: AC | PRN

## 2024-05-30 MED ORDER — TELMISARTAN 20 MG PO TABS: 20.0000 mg | ORAL_TABLET | Freq: Every day | ORAL | 2 refills | Status: AC

## 2024-05-30 MED ORDER — HYDROCHLOROTHIAZIDE 12.5 MG PO CAPS: 12.5000 mg | ORAL_CAPSULE | Freq: Every day | ORAL | 2 refills | Status: AC

## 2024-05-30 NOTE — Telephone Encounter (Signed)
 Patient medications was send to [facility] per her request.

## 2024-05-30 NOTE — Telephone Encounter (Signed)
 Added to pt chart.

## 2024-06-04 ENCOUNTER — Other Ambulatory Visit (HOSPITAL_COMMUNITY): Payer: Self-pay

## 2024-06-04 ENCOUNTER — Telehealth: Payer: Self-pay | Admitting: Pharmacist Clinician (PhC)/ Clinical Pharmacy Specialist

## 2024-06-04 ENCOUNTER — Encounter (HOSPITAL_BASED_OUTPATIENT_CLINIC_OR_DEPARTMENT_OTHER): Payer: Self-pay | Attending: General Surgery | Admitting: General Surgery

## 2024-06-04 ENCOUNTER — Telehealth: Payer: Self-pay | Admitting: Pharmacy Technician

## 2024-06-04 DIAGNOSIS — E11622 Type 2 diabetes mellitus with other skin ulcer: Secondary | ICD-10-CM | POA: Insufficient documentation

## 2024-06-04 DIAGNOSIS — T8131XS Disruption of external operation (surgical) wound, not elsewhere classified, sequela: Secondary | ICD-10-CM | POA: Diagnosis not present

## 2024-06-04 DIAGNOSIS — L02212 Cutaneous abscess of back [any part, except buttock]: Secondary | ICD-10-CM | POA: Insufficient documentation

## 2024-06-04 DIAGNOSIS — X58XXXS Exposure to other specified factors, sequela: Secondary | ICD-10-CM | POA: Insufficient documentation

## 2024-06-04 DIAGNOSIS — L98422 Non-pressure chronic ulcer of back with fat layer exposed: Secondary | ICD-10-CM | POA: Insufficient documentation

## 2024-06-04 NOTE — Telephone Encounter (Signed)
 Patient has new insurance for 2026.  Please update PA for Repatha 

## 2024-06-04 NOTE — Telephone Encounter (Signed)
" °  401.26 on new ins per transition fill  Pharmacy Patient Advocate Encounter   Received notification from Pt Calls Messages that prior authorization for repatha  is required/requested.   Insurance verification completed.   The patient is insured through [facility].   Per test claim: PA required; PA submitted to above mentioned insurance via Latent Key/confirmation #/EOC [id number] Status is pending  "

## 2024-06-04 NOTE — Telephone Encounter (Signed)
 Pharmacy Patient Advocate Encounter  Received notification from [facility] that Prior Authorization for REPATHA  has been APPROVED from 06/04/24 to 06/04/25   PA #/Case ID/Reference #: [id number]

## 2024-06-06 ENCOUNTER — Encounter: Payer: Self-pay | Admitting: Pharmacist

## 2024-06-09 ENCOUNTER — Other Ambulatory Visit (HOSPITAL_BASED_OUTPATIENT_CLINIC_OR_DEPARTMENT_OTHER): Payer: Self-pay

## 2024-06-11 ENCOUNTER — Other Ambulatory Visit (HOSPITAL_BASED_OUTPATIENT_CLINIC_OR_DEPARTMENT_OTHER): Payer: Self-pay

## 2024-06-11 NOTE — Telephone Encounter (Signed)
 Opened in error

## 2024-06-11 NOTE — Telephone Encounter (Signed)
 Requested refill for Repatha  from [facility] pharmacy and provided information below for [facility].  We are still waiting in [employer] to review her 2026 application for Jardiance  patient assistance program with her new 2026 Medicare card updates.   Effective: 01/07/24 - 01/05/25   APW:[id number] ERW:[id number] Hmnle:[id number] PI:[id number] [employer]  ID: [id number]

## 2024-06-18 ENCOUNTER — Encounter (HOSPITAL_BASED_OUTPATIENT_CLINIC_OR_DEPARTMENT_OTHER): Admitting: General Surgery

## 2024-06-18 ENCOUNTER — Telehealth: Payer: Self-pay | Admitting: Orthopedic Surgery

## 2024-06-18 DIAGNOSIS — E11622 Type 2 diabetes mellitus with other skin ulcer: Secondary | ICD-10-CM | POA: Diagnosis not present

## 2024-06-18 NOTE — Telephone Encounter (Signed)
 Noted thank you

## 2024-06-18 NOTE — Telephone Encounter (Signed)
 See phone note about this also.

## 2024-06-18 NOTE — Telephone Encounter (Signed)
 Patient is calling to let our office know that she saw Dr. Marolyn and he advised the patient to follow up with our office. Please advise if patient should be scheduled with Stacy or Dr. Clois.

## 2024-06-18 NOTE — Telephone Encounter (Signed)
 Disregard message. Dr. Clois sent a staff message on this patient to schedule with him.

## 2024-07-01 ENCOUNTER — Other Ambulatory Visit: Payer: Self-pay

## 2024-07-01 ENCOUNTER — Ambulatory Visit: Admitting: Neurosurgery

## 2024-07-01 ENCOUNTER — Encounter: Payer: Self-pay | Admitting: Neurosurgery

## 2024-07-01 VITALS — BP 132/78 | Ht 66.0 in | Wt 286.0 lb

## 2024-07-01 DIAGNOSIS — Z981 Arthrodesis status: Secondary | ICD-10-CM

## 2024-07-01 DIAGNOSIS — T148XXA Other injury of unspecified body region, initial encounter: Secondary | ICD-10-CM

## 2024-07-01 DIAGNOSIS — T8149XA Infection following a procedure, other surgical site, initial encounter: Secondary | ICD-10-CM

## 2024-07-01 DIAGNOSIS — T8142XA Infection following a procedure, deep incisional surgical site, initial encounter: Secondary | ICD-10-CM | POA: Diagnosis not present

## 2024-07-02 ENCOUNTER — Telehealth: Payer: Self-pay

## 2024-07-02 ENCOUNTER — Encounter: Payer: Self-pay | Admitting: Family Medicine

## 2024-07-02 ENCOUNTER — Ambulatory Visit: Admitting: Family Medicine

## 2024-07-02 ENCOUNTER — Encounter (HOSPITAL_BASED_OUTPATIENT_CLINIC_OR_DEPARTMENT_OTHER): Admitting: General Surgery

## 2024-07-02 ENCOUNTER — Other Ambulatory Visit (HOSPITAL_COMMUNITY): Payer: Self-pay

## 2024-07-02 VITALS — BP 106/70 | HR 93 | Resp 10 | Ht 67.0 in | Wt 286.2 lb

## 2024-07-02 DIAGNOSIS — E66813 Obesity, class 3: Secondary | ICD-10-CM

## 2024-07-02 DIAGNOSIS — E1169 Type 2 diabetes mellitus with other specified complication: Secondary | ICD-10-CM | POA: Insufficient documentation

## 2024-07-02 DIAGNOSIS — M1711 Unilateral primary osteoarthritis, right knee: Secondary | ICD-10-CM

## 2024-07-02 DIAGNOSIS — E785 Hyperlipidemia, unspecified: Secondary | ICD-10-CM

## 2024-07-02 DIAGNOSIS — E1142 Type 2 diabetes mellitus with diabetic polyneuropathy: Secondary | ICD-10-CM | POA: Insufficient documentation

## 2024-07-02 DIAGNOSIS — F4323 Adjustment disorder with mixed anxiety and depressed mood: Secondary | ICD-10-CM

## 2024-07-02 DIAGNOSIS — E1159 Type 2 diabetes mellitus with other circulatory complications: Secondary | ICD-10-CM

## 2024-07-02 DIAGNOSIS — L98422 Non-pressure chronic ulcer of back with fat layer exposed: Secondary | ICD-10-CM | POA: Insufficient documentation

## 2024-07-02 LAB — COMPREHENSIVE METABOLIC PANEL WITH GFR
ALT: 20 U/L (ref 3–35)
AST: 23 U/L (ref 5–37)
Albumin: 4.3 g/dL (ref 3.5–5.2)
Alkaline Phosphatase: 74 U/L (ref 39–117)
BUN: 16 mg/dL (ref 6–23)
CO2: 33 meq/L — ABNORMAL HIGH (ref 19–32)
Calcium: 10.1 mg/dL (ref 8.4–10.5)
Chloride: 96 meq/L (ref 96–112)
Creatinine, Ser: 0.95 mg/dL (ref 0.40–1.20)
GFR: 60.95 mL/min
Glucose, Bld: 120 mg/dL — ABNORMAL HIGH (ref 70–99)
Potassium: 4.3 meq/L (ref 3.5–5.1)
Sodium: 140 meq/L (ref 135–145)
Total Bilirubin: 0.5 mg/dL (ref 0.2–1.2)
Total Protein: 7.8 g/dL (ref 6.0–8.3)

## 2024-07-02 LAB — CBC WITH DIFFERENTIAL/PLATELET
Basophils Absolute: 0 10*3/uL (ref 0.0–0.1)
Basophils Relative: 0.3 % (ref 0.0–3.0)
Eosinophils Absolute: 0.1 10*3/uL (ref 0.0–0.7)
Eosinophils Relative: 1 % (ref 0.0–5.0)
HCT: 40.6 % (ref 36.0–46.0)
Hemoglobin: 13.4 g/dL (ref 12.0–15.0)
Lymphocytes Relative: 28.9 % (ref 12.0–46.0)
Lymphs Abs: 2.6 10*3/uL (ref 0.7–4.0)
MCHC: 33 g/dL (ref 30.0–36.0)
MCV: 82.9 fl (ref 78.0–100.0)
Monocytes Absolute: 1 10*3/uL (ref 0.1–1.0)
Monocytes Relative: 10.8 % (ref 3.0–12.0)
Neutro Abs: 5.4 10*3/uL (ref 1.4–7.7)
Neutrophils Relative %: 59 % (ref 43.0–77.0)
Platelets: 255 10*3/uL (ref 150.0–400.0)
RBC: 4.9 Mil/uL (ref 3.87–5.11)
RDW: 16.6 % — ABNORMAL HIGH (ref 11.5–15.5)
WBC: 9.2 10*3/uL (ref 4.0–10.5)

## 2024-07-02 LAB — HEMOGLOBIN A1C: Hgb A1c MFr Bld: 6.7 % — ABNORMAL HIGH (ref 4.6–6.5)

## 2024-07-02 LAB — MICROALBUMIN / CREATININE URINE RATIO
Creatinine,U: 129.3 mg/dL
Microalb Creat Ratio: 8 mg/g (ref 0.0–30.0)
Microalb, Ur: 1 mg/dL (ref 0.7–1.9)

## 2024-07-02 MED ORDER — RYBELSUS 3 MG PO TABS: 3.0000 mg | ORAL_TABLET | Freq: Every day | ORAL | 0 refills | Status: DC

## 2024-07-02 MED ORDER — RYBELSUS 3 MG PO TABS: 3.0000 mg | ORAL_TABLET | Freq: Every day | ORAL | 1 refills | Status: AC

## 2024-07-02 NOTE — Telephone Encounter (Signed)
 Pharmacy Patient Advocate Encounter   Received notification from Physician's Office that prior authorization for RYBELSUS  is required/requested.   Insurance verification completed.   The patient is insured through [employer].   Per test claim: The current 30 day co-pay is, $182.21.  No PA needed at this time. This test claim was processed through [facility]- copay amounts may vary at other pharmacies due to pharmacy/plan contracts, or as the patient moves through the different stages of their insurance plan.

## 2024-07-02 NOTE — Progress Notes (Signed)
 Pt notified. Will send to a different pharmacy

## 2024-07-02 NOTE — Telephone Encounter (Signed)
 Medication did not transmit sucessfully to [facility]. Pt notified and asked that he medication be sent to [facility]. Med resent. Pt has also be notified of the Test Claim and that med did not need PA. Pt reports that she is not able to afford that amount for her medication. [place] is there any patient assistance available for Rybelsus ?

## 2024-07-02 NOTE — Telephone Encounter (Signed)
 Pt was seen today and has been prescribed Rybelsus . We would like to try and stay ahead of the possibility of this needing a Prior Auth. Could we please initiate a PA? Thank you

## 2024-07-02 NOTE — Patient Instructions (Signed)
" °  VISIT SUMMARY: During your visit, we discussed several health concerns and developed a plan to address each one. Your chronic hardware-related infection, diabetes, obesity, depression, edema, hyperlipidemia, peripheral neuropathy, knee osteoarthritis, and history of breast cancer were all reviewed. We also discussed your general health maintenance.  YOUR PLAN: -CHRONIC HARDWARE-RELATED INFECTION: You have a chronic infection related to surgical hardware that has persisted for nearly a year. This infection is resistant to antibiotics and poses a risk of systemic illness. We have scheduled surgery on February 16th to remove the hardware, which is expected to be life-changing. Ensure you get medical and cardiac clearance before the surgery and monitor for signs of infection post-surgery, such as fever over 100F.  -TYPE 2 DIABETES MELLITUS: Your diabetes is currently managed with Jardiance , which also helps with fluid management. We plan to add Rybelsus , a daily pill that helps with weight management and glycemic control, after your surgery. Continue taking Jardiance  until three days before surgery. We will schedule a follow-up appointment post-surgery to assess your diabetes management.  -OBESITY: Your BMI is in the low 40s. We plan to address weight management with Rybelsus  after your surgery. You are motivated to lose weight to qualify for knee replacement surgery.  -DEPRESSION WITH ANXIETY AND BEREAVEMENT: You are experiencing depression and anxiety, exacerbated by your caregiving responsibilities. You are currently taking buspirone  15 mg daily. We will discuss potential adjustments to your dosage and explore respite care options to help alleviate caregiver fatigue.  -EDEMA: You experience swelling in your hands and feet if you miss your daily medications. Continue taking Lasix , hydrochlorothiazide , and Jardiance  daily to manage the swelling.  -HYPERLIPIDEMIA: Your high cholesterol is managed with  Repatha  due to intolerance to statins. Your LDL levels are very low, which is excellent. Continue taking Repatha  every two weeks and follow your cardiologist's recommendations.  -PERIPHERAL NEUROPATHY: You have nerve pain in your left foot, which you manage with topical nerve balm. Continue using the topical nerve balm for pain relief.  -RIGHT KNEE OSTEOARTHRITIS: You have severe arthritis in your right knee, and weight loss is necessary to qualify for knee replacement surgery. Continue receiving gel injections for pain management and focus on weight loss.  -HISTORY OF BREAST CANCER: You have a history of breast cancer treated with mastectomy and tamoxifen. Continue regular health maintenance and screenings to monitor your health.  -GENERAL HEALTH MAINTENANCE: You are up to date on your healthcare maintenance, including colonoscopy and other screenings. Continue being proactive about your health maintenance.  "

## 2024-07-02 NOTE — Progress Notes (Unsigned)
 "    Patient Care Team: Prentiss Frieze, DO as PCP - General (Family Medicine) Court Dorn PARAS, MD as PCP - Cardiology (Cardiology) Latisha Medford, MD as Consulting Physician (Obstetrics and Gynecology) [facility] (Ophthalmology) Goodrich, Callie E, PA-C as Physician Assistant (Cardiology)  Weight Management:   Starting weight: *** Starting date: *** Today's weight: *** Today's date: 07/02/2024 Total lbs lost to date: *** Total lbs lost since last in-office visit: *** Total weight loss percentage to date: -***% Body mass index is 44.83 kg/m.   Nutrition Plan: {EW NUTRITION HNJOD:65522}. Anti-obesity medications (including off-label): ***. Reported side effects: ***. Hunger is {EWCONTROLASSESSMENT:24261}. Cravings are {EWCONTROLASSESSMENT:24261}. Activity: {MWM EXERCISE RECS:23473}.  {EW OBESITY MED DOCUMENTATION:34479}  Diagnoses and Orders:   1. Type 2 diabetes mellitus with peripheral neuropathy (HCC)   2. Hyperlipidemia LDL goal <70   3. Hypertension associated with diabetes (HCC)   4. Hyperlipidemia associated with type 2 diabetes mellitus (HCC)   5. Class 3 severe obesity with body mass index (BMI) of 45.0 to 49.9 in adult, unspecified obesity type, unspecified whether serious comorbidity present (HCC)    No orders of the defined types were placed in this encounter.  Orders Placed This Encounter  Procedures   CBC with Differential/Platelet   Comprehensive metabolic panel with GFR   Hemoglobin A1c   Microalbumin / creatinine urine ratio   Assessment & Plan:   Assessment & Plan Chronic back ulcer with hardware infection Chronic back ulcer with hardware infection, persisting for nearly a year. Previous surgical intervention was unsuccessful, leading to recurrent infection. The infection is encapsulated but poses a risk of systemic illness. Surgery is planned to remove the hardware, which is expected to be life-changing. - Proceed with scheduled surgery on  February 16th to remove hardware. - Ensure medical and cardiac clearance prior to surgery. - Monitor for signs of infection post-surgery, including fever over 100F, and seek immediate medical attention if these occur.  Type 2 diabetes mellitus Managed with Jardiance , which also aids in fluid management. A1c levels have not been checked since July. Plan to add Rybelsus  post-surgery to aid in weight management and glycemic control. Rybelsus  is a daily pill version of GLP-1 receptor agonist, which is not preferred pre-surgery. - Continue Jardiance  until three days before surgery. - Will add Rybelsus  post-surgery for diabetes management and weight loss. - Will schedule follow-up appointment post-surgery to assess diabetes management.  Morbid obesity BMI in the low 40s. Previous attempts at weight loss through gastric bypass were unsuccessful due to adverse reactions. Plan to address weight management post-surgery with Rybelsus . She is motivated to lose weight to qualify for knee replacement surgery. - Will initiate Rybelsus  post-surgery for weight management. - Focus on weight loss to qualify for knee replacement surgery.  Depression with anxiety and bereavement Depression with anxiety and bereavement, exacerbated by caregiving responsibilities for her mother with dementia. Currently on buspirone  15 mg daily, which is higher than the prescribed 5 mg twice daily. She is experiencing caregiver fatigue and is seeking ways to manage her mental health. - Continue buspirone  15 mg daily. - Will discuss potential adjustments to buspirone  dosage with prescribing physician. - Explore respite care options to alleviate caregiver fatigue.  Edema Managed with Lasix , hydrochlorothiazide , and Jardiance . Swelling occurs if medications are missed, indicating a need for consistent adherence. - Continue Lasix  and hydrochlorothiazide  daily. - Continue Jardiance  for fluid management.  Hyperlipidemia Managed with  Repatha  due to intolerance to statins. LDL levels are very low at 14,  which is considered excellent. Cardiologist's recommendations will guide further management. - Continue Repatha  every two weeks. - Will follow cardiology recommendations regarding LDL levels.  Peripheral neuropathy Primarily affecting the left foot, with pain managed by topical nerve balm. She reports some relief with the use of topical treatments. - Continue using topical nerve balm for neuropathy pain management.  Right knee osteoarthritis Bone-on-bone changes. Weight loss is necessary to qualify for knee replacement surgery. She is currently receiving gel injections for symptom management. - Continue gel injections for knee pain management. - Focus on weight loss to qualify for knee replacement surgery.  Vitamin D  deficiency Vitamin D  levels are adequate as per recent labs.  History of breast cancer Treated with mastectomy and tamoxifen. She is vigilant about health maintenance due to history of breast cancer. - Continue regular health maintenance and screenings.  General health maintenance Up to date on healthcare maintenance, including colonoscopy and other screenings. She is proactive about health maintenance due to history of breast cancer. - Continue regular health maintenance and screenings.   Subjective:   History of Present Illness [name] is a 69 year old female who presents for pre-surgical evaluation and management of a chronic infection related to surgical hardware.  Chronic hardware-related infection - Chronic infection present for nearly one year, originating from a leaking wound post-surgery. - Infection is located on surgical hardware and has persisted despite management with wound pump and wound care center visits. - Infection is resistant to antibiotics.  Peripheral edema - Significant swelling attributed to prior tamoxifen use for breast cancer. - Swelling occurs in hands and  feet if daily Lasix , hydrochlorothiazide , or Jardiance  are missed. - Swelling managed with daily diuretics and Jardiance .  Diabetes mellitus and peripheral neuropathy - Diabetes managed with Jardiance , which also aids in fluid reduction. - Concern regarding A1c levels; recent labs show good glycemic control. - Peripheral neuropathy present, particularly in feet. - Uses topical nerve balm and joint balm for neuropathy relief. - Takes B12 supplements for neuropathy.  Hyperlipidemia and statin intolerance - Hyperlipidemia managed with Repatha  due to statin intolerance (muscle aches with statins). - Repatha  administered every two weeks without adverse effects. - Recent labs show LDL of 14.  Breast cancer history - History of breast cancer treated with mastectomy and ten years of tamoxifen.  Right knee osteoarthritis - Right knee is bone-on-bone. - Receives gel injections for symptomatic relief.  Psychological stress and anxiety - Caregiver for elderly mother with dementia, responsible for finances and medical appointments. - Experiences stress and anxiety related to caregiving role. - Manages anxiety with daily Buspar .  Review of Systems: Negative, with the exception of above mentioned in HPI.  History:   Reviewed by clinician on day of visit: allergies, medications, problem list, medical history, surgical history, family history, social history, and previous encounter notes.  Medications:   Show/hide medication list[1] Allergies[2]  Physical Exam:   BP 106/70   Pulse 93   Resp 10   Ht 5' 7 (1.702 m)   Wt 286 lb 3.2 oz (129.8 kg)   SpO2 96%   BMI 44.83 kg/m   Physical Exam  Laboratory and Imaging:   Last CBC Lab Results  Component Value Date   WBC 9.1 12/13/2023   HGB 12.2 12/13/2023   HCT 37.3 12/13/2023   MCV 83.7 12/13/2023   MCH 27.2 03/19/2023   RDW 17.7 (H) 12/13/2023   PLT 279.0 12/13/2023   Last metabolic panel Lab Results  Component Value Date  GLUCOSE 103 (H) 12/13/2023   NA 139 12/13/2023   K 3.8 12/13/2023   CL 97 12/13/2023   CO2 30 12/13/2023   BUN 15 12/13/2023   CREATININE 1.01 12/13/2023   GFR 56.85 (L) 12/13/2023   CALCIUM  9.7 12/13/2023   PROT 6.9 05/30/2024   ALBUMIN  4.2 05/30/2024   LABGLOB 2.7 07/20/2020   AGRATIO 1.6 07/20/2020   BILITOT 0.3 05/30/2024   ALKPHOS 88 05/30/2024   AST 20 05/30/2024   ALT 21 05/30/2024   ANIONGAP 8 03/19/2023   Last lipids Lab Results  Component Value Date   CHOL 86 (L) 05/30/2024   HDL 52 05/30/2024   LDLCALC 14 05/30/2024   LDLDIRECT 110.0 08/21/2016   TRIG 112 05/30/2024   CHOLHDL 1.7 05/30/2024   Last hemoglobin A1c Lab Results  Component Value Date   HGBA1C 6.7 (H) 12/13/2023   Last thyroid  functions Lab Results  Component Value Date   TSH 0.62 12/13/2023   T3TOTAL 143 07/21/2019   FREET4 0.77 12/13/2023   Last vitamin D  Lab Results  Component Value Date   VD25OH 62.49 12/13/2023   Last vitamin B12 and Folate Lab Results  Component Value Date   VITAMINB12 348 02/26/2023   FOLATE 22.8 11/21/2021   {See past labs  Heme  Chem  Endocrine  Serology  Results Review (optional):1}   Screening:   07/02/2024    PHQ2-9 Depression Screening   Little interest or pleasure in doing things Several days  Feeling down, depressed, or hopeless Several days  PHQ-2 - Total Score 2  Trouble falling or staying asleep, or sleeping too much Not at all  Feeling tired or having little energy Several days  Poor appetite or overeating  Several days  Feeling bad about yourself - or that you are a failure or have let yourself or your family down Not at all  Trouble concentrating on things, such as reading the newspaper or watching television Not at all  Moving or speaking so slowly that other people could have noticed.  Or the opposite - being so fidgety or restless that you have been moving around a lot more than usual Not at all  Thoughts that you would be better  off dead, or hurting yourself in some way Not at all  PHQ2-9 Total Score 4  If you checked off any problems, how difficult have these problems made it for you to do your work, take care of things at home, or get along with other people Not difficult at all  Depression Interventions/Treatment         07/02/2024   10:16 AM 01/17/2024    8:36 AM 02/26/2023   11:22 AM 11/13/2022    8:58 AM  GAD 7 : Generalized Anxiety Score  Nervous, Anxious, on Edge 1 1  1   0   Control/stop worrying 1 1  1   0   Worry too much - different things 1 1  1   0   Trouble relaxing 1 1  1   0   Restless 1 0  1  0   Easily annoyed or irritable 0 1  1  0   Afraid - awful might happen 0 0  0  0   Total GAD 7 Score 5 5 6  0  Anxiety Difficulty Not difficult at all Somewhat difficult Not difficult at all Not difficult at all     Data saved with a previous flowsheet row definition   Attestations:   Patient is establishing care in  this system with me as PCP. Available records reviewed. Chart updated today with reconciliation of problem list, medications, allergies, and relevant history. Preventive care and chronic disease status reviewed.  Outside labs reviewed and will be abstracted. Portions of historical chart may remain incomplete; will update on an ongoing basis as clinically indicated.  Time spent on visit including pre-visit chart review and post-visit care was *** minutes.  Geni Shutter, DO, MS (Nutrition), FAAFP, Dipl. ABOM Fellow, American Academy of Family Physicians Diplomate, American Board of Obesity Medicine [facility] [address] [place], [place] [address] Dept: [phone] Fax: [phone]      [1]  Outpatient Medications Prior to Visit  Medication Sig   busPIRone  (BUSPAR ) 15 MG tablet Take 1 tablet (15 mg total) by mouth 2 (two) times daily.   Calcium  Carbonate-Vit D-Min (CALCIUM  600 + MINERALS) 600-200 MG-UNIT TABS Take 600 mg by mouth  daily.   Cyanocobalamin  (B-12) 1000 MCG CAPS Take 1 capsule by mouth daily at 6 (six) AM.   Evolocumab  (REPATHA  SURECLICK) 140 MG/ML SOAJ Inject 140 mg into the skin every 14 (fourteen) days.   famotidine  (PEPCID ) 20 MG tablet Take 2 tablets (40 mg total) by mouth daily.   furosemide  (LASIX ) 40 MG tablet Take 1 tablet (40 mg total) by mouth daily.   hydrochlorothiazide  (MICROZIDE ) 12.5 MG capsule Take 1 capsule (12.5 mg total) by mouth daily.   JARDIANCE  10 MG TABS tablet Take 1 tablet (10 mg total) by mouth daily.   Multiple Vitamins-Minerals (CENTRUM SILVER ULTRA WOMENS) TABS Take 1 tablet by mouth daily.   Omega 3-6-9 Fatty Acids (OMEGA 3-6-9 PO) Take 300 mg by mouth daily. Omega XL   OVER THE COUNTER MEDICATION Take 2 tablets by mouth daily as needed (Tylenol  250mg /Ibuprofen  125mg  each).   peppermint oil liquid Apply topically as needed. Peppermint, wintergreen, black seed, basil and frankincense   telmisartan  (MICARDIS ) 20 MG tablet Take 1 tablet (20 mg total) by mouth daily.   cholecalciferol  (VITAMIN D3) 25 MCG (1000 UNIT) tablet Take 1,000 Units by mouth daily. (Patient not taking: Reported on 07/02/2024)   nitroGLYCERIN  (NITROSTAT ) 0.4 MG SL tablet Place 1 tablet (0.4 mg total) under the tongue every 5 (five) minutes as needed for chest pain. (Patient not taking: Reported on 07/02/2024)   No facility-administered medications prior to visit.  [2]  Allergies Allergen Reactions   Allopurinol  Hives   Fluoxetine  Hives   Mucinex  [Guaifenesin  Er] Shortness Of Breath   Penicillins Hives, Other (See Comments) and Itching    Has patient had a PCN reaction causing immediate rash, facial/tongue/throat swelling, SOB or lightheadedness with hypotension: no  Has patient had a PCN reaction causing severe rash involving mucus membranes or skin necrosis: no  Has patient had a PCN reaction that required hospitalization no  Has patient had a PCN reaction occurring within the last 10 years: no  If all  of the above answers are NO, then may proceed with Cephalosporin use.  Product containing penicillin (product)   Pregabalin  Swelling   Amitriptyline  Other (See Comments)    felt weird, fatigue, dizziness  amitriptyline    Bupropion  Other (See Comments)    Uncontrolled movements of tongue, hand   Escitalopram Oxalate Other (See Comments)    Nausea and hypersalivation.  escitalopram oxalate   Ozempic  (0.25 Or 0.5 Mg-Dose) [Semaglutide (0.25 Or 0.5mg -Dos)] Other (See Comments)    Abdominal pain   Prozac  [Fluoxetine  Hcl] Hives   Tirzepatide  Other (See Comments)  Rosuvastatin     "

## 2024-07-03 ENCOUNTER — Telehealth: Payer: Self-pay

## 2024-07-03 ENCOUNTER — Ambulatory Visit: Payer: Self-pay | Admitting: Family Medicine

## 2024-07-03 NOTE — Telephone Encounter (Signed)
 Per discussion with Dr Fayette and Dr Clois, I spoke with the patient and confirmed that she is not currently on antibiotics. I informed her that they would like her to refrain from taking antibiotics between now and surgery and asked her to contact us  if something comes up between now and surgery that requires antibiotics so it can be reviewed. I explained that this is because they plan to send her spinal implants for culture.  Per discussion with Dr Clois, she has cefazolin ordered for on call to OR. Dr Clois said if she tests + for MRSA, we will discuss/review at that time. Her surgical PCR screen in 2023 was negative. Holly Pereyra, NP with pre-admit testing has also been notified of the above.

## 2024-07-04 DIAGNOSIS — F4323 Adjustment disorder with mixed anxiety and depressed mood: Secondary | ICD-10-CM | POA: Insufficient documentation

## 2024-07-04 NOTE — Progress Notes (Signed)
 We have received the surgical clearance form and it is on Dr Nonda desk to be signed.

## 2024-07-04 NOTE — Telephone Encounter (Unsigned)
 Copied from CRM #[id number]. Topic: General - Other >> Jul 04, 2024  9:41 AM Alfonso HERO wrote: Reason for CRM: [facility] calling for stsauts of surgery clearance form.

## 2024-07-04 NOTE — Progress Notes (Signed)
 We have received the printed copy of this letter and it is on Dr Prentiss desk to complete.

## 2024-07-04 NOTE — Telephone Encounter (Signed)
 Pt notified of our plan to initiate the referral [id number], so that she can have a visit with [place]. She is agreeable to this. I will get this referral started.

## 2024-07-04 NOTE — Addendum Note (Signed)
 Addended by: Christana Angelica on: 07/04/2024 09:06 AM   Modules accepted: Orders

## 2024-07-08 ENCOUNTER — Inpatient Hospital Stay: Admission: RE | Admit: 2024-07-08

## 2024-07-14 ENCOUNTER — Ambulatory Visit: Admit: 2024-07-14 | Admitting: Neurosurgery

## 2024-07-18 ENCOUNTER — Encounter: Admitting: Family Medicine

## 2024-07-29 ENCOUNTER — Encounter: Admitting: Orthopedic Surgery

## 2024-08-12 ENCOUNTER — Ambulatory Visit: Admitting: Family Medicine

## 2024-08-19 ENCOUNTER — Encounter: Admitting: Neurosurgery

## 2024-10-02 ENCOUNTER — Encounter: Admitting: Orthopedic Surgery

## 2024-12-11 ENCOUNTER — Encounter: Admitting: Family Medicine

## 2025-05-08 ENCOUNTER — Ambulatory Visit
